# Patient Record
Sex: Female | Born: 1950 | Race: Black or African American | Hispanic: No | Smoking: Former smoker
Health system: Southern US, Community
[De-identification: ages and names within clinical notes are randomized; demographics above are authoritative.]

## PROBLEM LIST (undated history)

## (undated) DIAGNOSIS — I219 Acute myocardial infarction, unspecified: Secondary | ICD-10-CM

## (undated) DIAGNOSIS — E785 Hyperlipidemia, unspecified: Secondary | ICD-10-CM

## (undated) DIAGNOSIS — F323 Major depressive disorder, single episode, severe with psychotic features: Secondary | ICD-10-CM

## (undated) DIAGNOSIS — I639 Cerebral infarction, unspecified: Secondary | ICD-10-CM

## (undated) DIAGNOSIS — K219 Gastro-esophageal reflux disease without esophagitis: Secondary | ICD-10-CM

## (undated) DIAGNOSIS — I10 Essential (primary) hypertension: Secondary | ICD-10-CM

## (undated) DIAGNOSIS — H547 Unspecified visual loss: Secondary | ICD-10-CM

## (undated) DIAGNOSIS — K529 Noninfective gastroenteritis and colitis, unspecified: Secondary | ICD-10-CM

## (undated) DIAGNOSIS — F419 Anxiety disorder, unspecified: Secondary | ICD-10-CM

## (undated) HISTORY — PX: OTHER SURGICAL HISTORY: SHX169

## (undated) HISTORY — DX: Noninfective gastroenteritis and colitis, unspecified: K52.9

## (undated) HISTORY — PX: BILATERAL SALPINGOOPHORECTOMY: SHX1223

## (undated) HISTORY — DX: Cerebral infarction, unspecified: I63.9

## (undated) HISTORY — PX: ABDOMINAL HYSTERECTOMY: SHX81

## (undated) HISTORY — PX: CORONARY ANGIOPLASTY WITH STENT PLACEMENT: SHX49

---

## 2000-09-21 DIAGNOSIS — I639 Cerebral infarction, unspecified: Secondary | ICD-10-CM

## 2000-09-21 HISTORY — DX: Cerebral infarction, unspecified: I63.9

## 2001-02-08 ENCOUNTER — Emergency Department (HOSPITAL_COMMUNITY): Admission: EM | Admit: 2001-02-08 | Discharge: 2001-02-08 | Payer: Self-pay | Admitting: *Deleted

## 2001-03-01 ENCOUNTER — Ambulatory Visit (HOSPITAL_COMMUNITY): Admission: RE | Admit: 2001-03-01 | Discharge: 2001-03-01 | Payer: Self-pay | Admitting: Family Medicine

## 2001-03-01 ENCOUNTER — Encounter: Payer: Self-pay | Admitting: Family Medicine

## 2002-12-09 ENCOUNTER — Inpatient Hospital Stay (HOSPITAL_COMMUNITY): Admission: AD | Admit: 2002-12-09 | Discharge: 2002-12-11 | Payer: Self-pay | Admitting: *Deleted

## 2002-12-10 ENCOUNTER — Encounter: Payer: Self-pay | Admitting: *Deleted

## 2002-12-16 ENCOUNTER — Encounter: Payer: Self-pay | Admitting: Internal Medicine

## 2002-12-16 ENCOUNTER — Emergency Department (HOSPITAL_COMMUNITY): Admission: EM | Admit: 2002-12-16 | Discharge: 2002-12-16 | Payer: Self-pay | Admitting: Internal Medicine

## 2003-01-02 ENCOUNTER — Emergency Department (HOSPITAL_COMMUNITY): Admission: EM | Admit: 2003-01-02 | Discharge: 2003-01-02 | Payer: Self-pay | Admitting: *Deleted

## 2003-01-02 ENCOUNTER — Encounter: Payer: Self-pay | Admitting: *Deleted

## 2003-01-02 ENCOUNTER — Emergency Department (HOSPITAL_COMMUNITY): Admission: EM | Admit: 2003-01-02 | Discharge: 2003-01-02 | Payer: Self-pay | Admitting: Emergency Medicine

## 2003-01-15 ENCOUNTER — Ambulatory Visit (HOSPITAL_COMMUNITY): Admission: RE | Admit: 2003-01-15 | Discharge: 2003-01-15 | Payer: Self-pay | Admitting: Emergency Medicine

## 2003-01-19 ENCOUNTER — Ambulatory Visit (HOSPITAL_COMMUNITY): Admission: RE | Admit: 2003-01-19 | Discharge: 2003-01-19 | Payer: Self-pay | Admitting: Unknown Physician Specialty

## 2003-01-19 ENCOUNTER — Encounter: Payer: Self-pay | Admitting: Family Medicine

## 2003-01-30 ENCOUNTER — Encounter: Payer: Self-pay | Admitting: Emergency Medicine

## 2003-01-30 ENCOUNTER — Emergency Department (HOSPITAL_COMMUNITY): Admission: EM | Admit: 2003-01-30 | Discharge: 2003-01-30 | Payer: Self-pay | Admitting: *Deleted

## 2003-02-08 ENCOUNTER — Emergency Department (HOSPITAL_COMMUNITY): Admission: EM | Admit: 2003-02-08 | Discharge: 2003-02-08 | Payer: Self-pay | Admitting: Emergency Medicine

## 2003-02-08 ENCOUNTER — Encounter: Payer: Self-pay | Admitting: Emergency Medicine

## 2003-02-09 ENCOUNTER — Emergency Department (HOSPITAL_COMMUNITY): Admission: EM | Admit: 2003-02-09 | Discharge: 2003-02-09 | Payer: Self-pay | Admitting: Emergency Medicine

## 2003-02-12 ENCOUNTER — Emergency Department (HOSPITAL_COMMUNITY): Admission: EM | Admit: 2003-02-12 | Discharge: 2003-02-12 | Payer: Self-pay | Admitting: Emergency Medicine

## 2003-03-01 ENCOUNTER — Inpatient Hospital Stay (HOSPITAL_COMMUNITY): Admission: EM | Admit: 2003-03-01 | Discharge: 2003-03-06 | Payer: Self-pay | Admitting: *Deleted

## 2003-03-01 ENCOUNTER — Encounter: Payer: Self-pay | Admitting: *Deleted

## 2003-03-02 ENCOUNTER — Encounter: Payer: Self-pay | Admitting: *Deleted

## 2003-03-16 ENCOUNTER — Ambulatory Visit (HOSPITAL_COMMUNITY): Admission: RE | Admit: 2003-03-16 | Discharge: 2003-03-16 | Payer: Self-pay | Admitting: Cardiology

## 2003-03-20 ENCOUNTER — Encounter: Payer: Self-pay | Admitting: Cardiology

## 2003-04-30 ENCOUNTER — Inpatient Hospital Stay (HOSPITAL_COMMUNITY): Admission: EM | Admit: 2003-04-30 | Discharge: 2003-05-03 | Payer: Self-pay | Admitting: Psychiatry

## 2003-05-03 ENCOUNTER — Inpatient Hospital Stay (HOSPITAL_COMMUNITY): Admission: EM | Admit: 2003-05-03 | Discharge: 2003-05-06 | Payer: Self-pay | Admitting: Emergency Medicine

## 2004-01-04 ENCOUNTER — Ambulatory Visit (HOSPITAL_COMMUNITY): Admission: RE | Admit: 2004-01-04 | Discharge: 2004-01-04 | Payer: Self-pay | Admitting: General Surgery

## 2004-01-09 ENCOUNTER — Emergency Department (HOSPITAL_COMMUNITY): Admission: EM | Admit: 2004-01-09 | Discharge: 2004-01-09 | Payer: Self-pay | Admitting: Emergency Medicine

## 2004-05-12 ENCOUNTER — Emergency Department (HOSPITAL_COMMUNITY): Admission: EM | Admit: 2004-05-12 | Discharge: 2004-05-13 | Payer: Self-pay | Admitting: *Deleted

## 2004-05-14 ENCOUNTER — Inpatient Hospital Stay (HOSPITAL_COMMUNITY): Admission: EM | Admit: 2004-05-14 | Discharge: 2004-05-16 | Payer: Self-pay | Admitting: Emergency Medicine

## 2004-09-02 ENCOUNTER — Ambulatory Visit: Payer: Self-pay | Admitting: Internal Medicine

## 2004-09-02 ENCOUNTER — Inpatient Hospital Stay (HOSPITAL_COMMUNITY): Admission: AD | Admit: 2004-09-02 | Discharge: 2004-09-09 | Payer: Self-pay | Admitting: Internal Medicine

## 2004-09-02 ENCOUNTER — Ambulatory Visit: Payer: Self-pay | Admitting: Cardiology

## 2004-09-04 ENCOUNTER — Ambulatory Visit: Payer: Self-pay | Admitting: *Deleted

## 2004-09-05 HISTORY — PX: ESOPHAGOGASTRODUODENOSCOPY: SHX1529

## 2005-02-03 ENCOUNTER — Other Ambulatory Visit: Admission: RE | Admit: 2005-02-03 | Discharge: 2005-02-03 | Payer: Self-pay | Admitting: Orthopaedic Surgery

## 2005-11-27 ENCOUNTER — Ambulatory Visit (HOSPITAL_COMMUNITY): Admission: RE | Admit: 2005-11-27 | Discharge: 2005-11-27 | Payer: Self-pay | Admitting: Internal Medicine

## 2006-07-26 ENCOUNTER — Ambulatory Visit: Payer: Self-pay | Admitting: Cardiology

## 2006-08-02 ENCOUNTER — Emergency Department (HOSPITAL_COMMUNITY): Admission: EM | Admit: 2006-08-02 | Discharge: 2006-08-02 | Payer: Self-pay | Admitting: Emergency Medicine

## 2007-04-19 ENCOUNTER — Emergency Department (HOSPITAL_COMMUNITY): Admission: EM | Admit: 2007-04-19 | Discharge: 2007-04-19 | Payer: Self-pay | Admitting: Emergency Medicine

## 2008-04-21 ENCOUNTER — Emergency Department (HOSPITAL_COMMUNITY): Admission: EM | Admit: 2008-04-21 | Discharge: 2008-04-21 | Payer: Self-pay | Admitting: Emergency Medicine

## 2008-09-22 ENCOUNTER — Emergency Department (HOSPITAL_COMMUNITY): Admission: EM | Admit: 2008-09-22 | Discharge: 2008-09-22 | Payer: Self-pay | Admitting: Emergency Medicine

## 2009-03-18 DIAGNOSIS — N289 Disorder of kidney and ureter, unspecified: Secondary | ICD-10-CM | POA: Insufficient documentation

## 2009-03-18 DIAGNOSIS — I10 Essential (primary) hypertension: Secondary | ICD-10-CM | POA: Insufficient documentation

## 2009-03-18 DIAGNOSIS — E785 Hyperlipidemia, unspecified: Secondary | ICD-10-CM

## 2010-10-11 ENCOUNTER — Encounter: Payer: Self-pay | Admitting: Internal Medicine

## 2010-10-13 ENCOUNTER — Encounter: Payer: Self-pay | Admitting: Internal Medicine

## 2011-01-05 LAB — DIFFERENTIAL
Basophils Absolute: 0 10*3/uL (ref 0.0–0.1)
Basophils Relative: 0 % (ref 0–1)
Eosinophils Relative: 0 % (ref 0–5)
Monocytes Relative: 7 % (ref 3–12)
Neutro Abs: 18.2 10*3/uL — ABNORMAL HIGH (ref 1.7–7.7)
Neutrophils Relative %: 90 % — ABNORMAL HIGH (ref 43–77)

## 2011-01-05 LAB — COMPREHENSIVE METABOLIC PANEL
CO2: 24 mEq/L (ref 19–32)
GFR calc Af Amer: >60 mL/min (ref >60)
Glucose, Bld: 152 mg/dL — ABNORMAL HIGH (ref 70–99)
Potassium: 3.4 mEq/L — ABNORMAL LOW (ref 3.5–5.1)

## 2011-01-05 LAB — URINE MICROSCOPIC-ADD ON

## 2011-01-05 LAB — URINALYSIS, ROUTINE W REFLEX MICROSCOPIC
Leukocytes, UA: NEGATIVE
Nitrite: NEGATIVE
Urobilinogen, UA: 1 mg/dL (ref 0.0–1.0)

## 2011-01-05 LAB — CBC
HCT: 41.5 % (ref 36.0–46.0)
Platelets: 253 10*3/uL (ref 150–400)
WBC: 20.4 10*3/uL — ABNORMAL HIGH (ref 4.0–10.5)

## 2011-01-05 LAB — LIPASE, BLOOD: Lipase: 18 U/L (ref 11–59)

## 2011-02-06 NOTE — H&P (Signed)
[name], [name]                      ACCOUNT NO.:  [id number]   MEDICAL RECORD NO.:  [id number]                   PATIENT TYPE:  INP   LOCATION:  IC09                                 FACILITY:  [facility]   PHYSICIAN:  Corrie Mckusick, M.D.               DATE OF BIRTH:  29-Jan-1951   DATE OF ADMISSION:  05/03/2003  DATE OF DISCHARGE:                                HISTORY & PHYSICAL   PRIMARY PHYSICIAN:  [facility].   ADMITTING DIAGNOSIS:  Pyelonephritis.   ADDITIONAL DIAGNOSES:  1. Coronary artery disease.  2. History of cerebrovascular accident.  3. Hypertension.  4. Hyperlipidemia.  5. Psychiatric history with recent discharge from [facility] on the     day of admission here to [facility].   ADMITTING CONDITION:  Guarded.   HISTORY OF PRESENTING ILLNESS:  This is a 60 year old African-American  female with longstanding history of coronary artery disease which is  inoperable, status post recent catheterization in March of this year.  She  has had a history of stent placement in the past and was told that she  cannot even undergo CABG this year.  She also has a history of hypertension,  hyperlipidemia, history of CVA, as well as history of depression.  She was  in [facility] for three days due to worsening of her  chronic depression.  She had some suicidal thoughts due to the fact that she  was worried about getting on disability.  She now states that she no longer  feels suicidal whatsoever and contracts with me.  She was discharged from  [facility] today with a fever of 102.   For the last 48 hours the patient has had dysuria, frequency, and urgency.  She has also had fevers to 101, 102.  She has not noticed any blood in her  urine.  She has now today had left-sided flank pain.  GI review of systems  otherwise negative.  Respiratory review of systems as well as cardiovascular  review of systems are negative.   PAST MEDICAL  HISTORY:  1. Coronary artery disease, status post MI and stents in 1997.  Again,     attempted stents in March 2004, with CABG needed but inoperable.  2. History of CVA.  3. Hypertension.  4. Hyperlipidemia.  5. Depression.   PAST SURGICAL HISTORY:  1. BTL.  2. Total abdominal hysterectomy.  3. Bilateral breast tumor removals.   MEDICATIONS ON ADMISSION:  1. Clonidine 0.3 p.o. b.i.d.  2. Toprol 100 mg p.o. daily.  3. Pravachol 20 mg p.o. q.h.s.  4. Norvasc 10 mg p.o. daily.  5. Ditropan 5 mg p.o. daily.  6. Aspirin 81 mg p.o. daily.  7. Plavix 75 mg p.o. daily.  8. Xanax 0.5 mg p.o. b.i.d.  9. Avapro 300 mg p.o. daily.  10.      Seroquel 25 mg, 2 p.o. q.h.s.  11.  Ambien 10 mg p.o. q.h.s.  12.      Paxil CR 25 mg p.o. daily.   ALLERGIES:  PENICILLIN and CONTRAST DYE.   SOCIAL HISTORY:  Smokes a pack a day since age 87.  No alcohol or illicit  drug use.  Lives alone.   FAMILY HISTORY:  Noncontributory.  No history of depression in her family,  cardiac disease, CVAs, or diabetes.   PHYSICAL EXAMINATION:  VITAL SIGNS:  Temperature 103, heart rate 123, blood  pressure is 136/82, respiratory rate 18.  GENERAL:  When I saw the patient she was feeling quite a bit better as she  had received her first dose of Rocephin.  She was in no acute distress and  had no complaints.  HEENT:  Mucous membranes are slightly dry but otherwise clear.  There is no  erythema.  NECK:  No lymphadenopathy.  No JVD.  CHEST:  Clear to auscultation bilaterally.  CARDIOVASCULAR:  Regular rate and rhythm, with a normal S1, S2.  No S3, S4,  murmurs, gallops, or rubs.  ABDOMEN:  Bowel sounds positive.  Soft, nontender.  There is no suprapubic  tenderness.  There is no rebound.  No guarding.  There is some mild left  flank pain with pounding.  EXTREMITIES:  No cyanosis, clubbing.  No edema.   LABORATORIES:  White blood cell count 22.7, hemoglobin 11.3, hematocrit  32.3, platelets 259.  Absolute  neutrophil count is 19.5.  Sodium 139,  potassium 3.3, chloride 101, bicarbonate 27, glucose 169, BUN 9, creatinine  0.9.  Urinalysis:  Specific gravity 1.006, pH of 5.5, glucose negative,  moderate amount of blood, negative ketones, negative nitrates, small amount  of leukocyte esterase.  Positive for bacteria and white blood cells.   ASSESSMENT:  This is a 60 year old female with multiple comorbidities  including coronary artery disease, inoperable hypertension, hyperlipidemia,  and depression who [facility] due  to suicidal intentions who presents with early pyelonephritis.   PLAN:  1. Admit to 2A for close monitoring.  The patient will not be put on suicide     watch if she contracts with me that she is no longer suicidal.  2. Will continue Rocephin 1 g IV q.12h.  3. Urine cultures obtained.  4. D-5 half normal saline with 20 mEq of KCl at 100 mL/h for hydration as     well as for the hyponatremia.  5. Will continue her current medications and close monitoring.  6. Will work with her to discontinue smoking.  7. Repeat CBC in the morning to assure that white count is declining as well     as the hemoglobin and hematocrit are stable.  Will also repeat the Chem-7     in the morning to assure that the hypokalemia is improving.   Patient discussed with Dr. Josefine Class.                                               Corrie Mckusick, M.D.    JCG/MEDQ  D:  05/03/2003  T:  05/04/2003  Job:  [id number]

## 2011-02-06 NOTE — Cardiovascular Report (Signed)
NAME:  [name], [name]                      ACCOUNT NO.:  [id number]   MEDICAL RECORD NO.:  [id number]                   PATIENT TYPE:  INP   LOCATION:  3707                                 FACILITY:  [facility]   PHYSICIAN:  Learta Codding, M.D. [facility]             DATE OF BIRTH:  December 28, 1950   DATE OF PROCEDURE:  12/11/2002  DATE OF DISCHARGE:  12/11/2002                              CARDIAC CATHETERIZATION   REFERRING PHYSICIAN:  Dr. Yell Bing.   PROCEDURES PERFORMED:  1. Left heart catheterization with selective coronary angiography.  2. Ventriculography.   DIAGNOSES:  1. Single-vessel coronary artery disease with an occluded right coronary     artery.  2. Normal left ventricular systolic function with inferior akinesis.   INDICATION:  The patient is a 60 year old African American with a prior  history of myocardial infarction (inferior wall myocardial infarction) with  stent to the right coronary artery in 1997.  The patient now presents with a  non-Q-wave myocardial infarction after acute onset of substernal chest  pressure.  She had mild troponin elevation of approximately 2.1.  She is now  referred for diagnostic catheterization to assess her coronary anatomy.   DESCRIPTION OF PROCEDURE:  After informed consent was obtained, the patient  was brought to the catheterization laboratory.  The right groin was  sterilely prepped and draped.  Lidocaine 1% was infiltrated.  A 6-French  arterial sheath was placed using a modified Seldinger technique.  Following  this, 6-French JL4 and JR4 catheters were used to engage the left and right  coronary arteries.  Ventriculography was performed in single-plane RAO  projection using power injection of contrast.  At the termination of the  procedure, all catheters and sheaths were removed and the patient was  brought back to the holding area.   FINDINGS:  Left ventricular systolic pressure of 170/5 mmHg.  Aortic  pressure 152/86  mmHg.   VENTRICULOGRAPHY:  Ejection fraction was approximately 50-60% with a large  area of inferior akinesis.  No significant mitral regurgitation.   SELECTIVE ANGIOGRAPHY:  1. Left main coronary artery was a large-caliber vessel with no evidence of     flow-limiting disease.  2. Left anterior descending artery was a large-caliber vessel giving rise to     several diagonal branches with no evidence of flow-limiting disease.  3. Circumflex had some mild plaquing in the proximal segment.  Obtuse     marginal branches were free of flow-limiting disease.  4. Right coronary artery was a small vessel.  Stent was visualized in the     proximal-to-mid segment.  There was diffuse in-stent restenosis of     approximately 40-50% with tandem lesions at the distal end of the stent     of approximately 99%.  The remainder of the RCA was diffusely diseased     and ultimately occluded in its distal left segment.  There were left and  right collaterals, septal perforators collateralizing to the right     coronary artery.   RECOMMENDATION:  Angiographic images were carefully reviewed with Dr. Veneda Melter.  The plan is to continue to medical therapy.  The right coronary  artery appears to be occluded but there are left-to-right collaterals.  Unless the patient has recurrent substernal chest pain and is failing  medical therapy, then critical consideration will be given to percutaneous  intervention.  This, however, would require stenting of the entire right  coronary artery and I suspect the distal RCA has been a rather chronic  occlusion.  The patient does have a small gradient across the aortic valve  and should undergo 2-D echocardiographic study, however, the left  ventricular and the aortic pressure were very variable during the phase of  respiration.  The patient also has significant hypertension and should be  aggressively treated for her hypertension.                                                Learta Codding, M.D. [facility]    GED/MEDQ  D:  12/11/2002  T:  12/12/2002  Job:  [id number]   cc:   [place] Bing, M.D. [facility]

## 2011-02-06 NOTE — Consult Note (Signed)
[name], [name]                      ACCOUNT NO.:  [id number]   MEDICAL RECORD NO.:  [id number]                   PATIENT TYPE:  INP   LOCATION:  A228                                 FACILITY:  [facility]   PHYSICIAN:  [place] Bing, M.D.               DATE OF BIRTH:  Jun 29, 1951   DATE OF CONSULTATION:  05/15/2004  DATE OF DISCHARGE:                                   CONSULTATION   REFERRING PHYSICIAN:  Tesfaye D. Felecia Shelling, M.D.   HISTORY OF PRESENT ILLNESS:  A 60 year old woman with known coronary  disease, admitted to the hospital with chest discomfort and palpitations.  [name] cardiac history dates to 55 when she suffered an acute  inferior myocardial infarction treated with primary percutaneous  intervention to the RCA.  She subsequently did well until the year 2000 by  her report when she was readmitted with chest discomfort.  She underwent  repeat coronary angiography in March of 2004 and was found to have moderate  diffuse end-stent restenosis.  Left-to-right collaterals were present at  that time.  Medical therapy was thought appropriate.   She has continued to intermittently have chest discomfort that is generally  managed with sublingual nitroglycerin or rest.  The night prior to  admission, she noted chest discomfort that responded to nitroglycerin.  The  following night, while resting in bed, she developed a sensation of  indigestion that was fairly vague and associated with palpitations.  She has  subsequently noted left-sided burning chest discomfort of moderate severity.  There was associated flushing and dyspnea with a sense of presyncope.  EMS  was summoned and the patient was transported to the hospital.  By the time  of arrival, her symptoms had essentially resolved.   [name] has a history of hypertension.  She continues to smoke  cigarettes.  She has not had diabetes.  She has had hyperlipidemia.   CURRENT MEDICATIONS:  1. Pravastatin 20 mg  daily.  2. Oxybutynin 5 mg daily.  3. Nitroglycerin p.r.n.  4. Clopidogrel 75 mg daily.  5. Iron supplement 325 mg b.i.d.  6. Colace 100 mg b.i.d.  7. Amlodipine 5 mg daily.  8. Metoprolol 50 mg daily.  9. Multivitamins.  10.      Aspirin 81 mg daily.  11.      Clonidine 0.3 mg b.i.d.  12.      Avalide 300/12.5 mg daily.  13.      Seroquel 200 mg q.h.s.   The patient receives disability, but has lost her Medicaid and has trouble  affording her medications.   ALLERGIES:  She reports multiple allergies/drug sensitivities, including:  1. PENICILLIN.  2. MORPHINE.  3. IODINE.  4. INTRAVENOUS CONTRAST.  5. ASPIRIN.  6. MOTRIN.   PAST MEDICAL HISTORY:  Otherwise notable for:  1. Hyperthyroidism.  2. Anemia without a clear etiology.  3. TAH/BSO in her early 77s for benign disease.  4. A  history of CVA a few years ago.  5. Depression.  6. Mild renal artery stenosis.   SOCIAL HISTORY:  Lives alone in [place], [place], with three grown  children in the area.  Continues to smoke one packs of cigarettes per day.  History of cannabis use.  Denies excessive use of alcohol.   FAMILY HISTORY:  Her father had multiple sclerosis and died in his 50s.  Her  mother is alive in her 55s with dementia and a history of cerebral aneurysm.  She had one brother, who died at age 20.   REVIEW OF SYSTEMS:  Notable for intermittent diaphoresis, weight loss,  decreased appetite, decreased visual acuity since her stroke, depression,  constipation and numbness in her hands and feet.  All other systems reviewed  and are negative.   PHYSICAL EXAMINATION:  GENERAL APPEARANCE:  A chronically ill-appearing  woman in no acute distress.  VITAL SIGNS:  The temperature is 97 degrees, heart rate 90 and regular,  respirations 20 and blood pressure 100/60.  HEENT:  Anicteric sclerae.  Normal lids and conjunctivae.  NECK:  No jugular venous distention.  Normal carotid upstrokes without  bruits.  SKIN:   Sallow.  ENDOCRINE:  No thyromegaly.  HEMATOPOIETIC:  No cervical, axillary or inguinal adenopathy.  LUNGS:  Clear.  CARDIAC:  Normal first and second heart sounds.  ABDOMEN:  Soft and nontender.  No organomegaly.  Normal bowel sounds.  No  bruits.  EXTREMITIES:  Distal pulses intact.  No edema.  NEUROMUSCULAR:  Symmetric strength and tone.  MUSCULOSKELETAL:  No joint deformities.   LABORATORY DATA:  Chest x-ray reportedly unremarkable.   EKG:  Normal sinus rhythm, low voltage, otherwise within normal limits.   Other laboratories notable for normal CPK and CPK-MB with troponins of 0.05  and 0.07.  Normal renal function.  Significant anemia with hemoglobin of 9.6  and hematocrit of 28.1.   IMPRESSION:  [name] has had recurrent chest discomfort since  myocardial infarction eight years ago.  Catheterization was relatively good  one-and-a-half years ago.  In the absence of compelling evidence for  myocardial ischemia, a pharmacologic stress Cardiolite study is warranted to  exclude significant coronary stenosis.  A lipid profile will also be  obtained.  Her medical regimen at present appears relatively good.  She  probably does not have iron deficiency anemia.  Consultation with a  hematologist may be warranted.  Records from [facility] were  obtained and were reviewed.  We appreciate the consultation request and will  be happy to follow this woman with you both in hospital and subsequent to  discharge.      ___________________________________________                                            [place] Bing, M.D.   RR/MEDQ  D:  05/15/2004  T:  05/15/2004  Job:  [id number]

## 2011-02-06 NOTE — Discharge Summary (Signed)
NAME:  OLEVA, KOO                      ACCOUNT NO.:  [id number]   MEDICAL RECORD NO.:  [id number]                   PATIENT TYPE:  IPS   LOCATION:  0500                                 FACILITY:  [facility]   PHYSICIAN:  Jeanice Lim, M.D.              DATE OF BIRTH:  21-Oct-1950   DATE OF ADMISSION:  04/30/2003  DATE OF DISCHARGE:  05/03/2003                                 DISCHARGE SUMMARY   IDENTIFYING DATA:  This is a 60 year old widowed African-American female  voluntarily admitted, presenting with a history of depression, passive  suicidal ideation and overwhelmed with finances, medical problems, feels  when she tries harder, she is knocked down.  Recently, nephew was killed  from home invasion.  Decreased sleep, appetite, hopeless, helpless,  paranoid.   MEDICATIONS:  Clonidine 0.3 mg b.i.d., Toprol XL 100 mg q.d., Pravachol,  Norvasc, aspirin, Plavix, Avapro and Ditropan.   ALLERGIES:  PENICILLIN and ASPIRIN.   PHYSICAL EXAMINATION:  Essentially within normal limits.  Neurologically  nonfocal.   LABORATORY DATA:  EKG within normal limits.  Glucose 126.  CBC within normal  limits.  Urine drug screen positive for cannabis and benzodiazepines.   MENTAL STATUS EXAM:  Alert, middle-aged, African-American female.  Cooperative.  Little eye contact.  Soft speech.  Mood hopeless.  Affect  flat, depressed.  Thought process goal directed but experiencing possible  paranoid ideation and passive suicidal ideation.  Cognition intact.  Judgment and insight fair.   ADMISSION DIAGNOSES:   AXIS I:  Major depressive disorder with psychotic features.   AXIS II:  Deferred.   AXIS III:  1. Status post myocardial infarction x 2.  2. Hypertension.  3. Cerebrovascular accident.   AXIS IV:  Moderate (problems with economic, other psychosocial issues and  medical problems as well as recent loss).   AXIS V:  30/60.   HOSPITAL COURSE:  The patient was admitted and ordered routine  p.r.n.  medications and underwent further monitoring.  Was encouraged to participate  in individual, group and milieu therapy.  The patient described multiple  stressors, fleeting suicidal ideation and feeling depressed.  Had been off  psychiatric medicines for greater than two years and has had multiple  cardiac events and does, at times, have thoughts of overdosing, describing a  plan.  Paxil was optimized targeting depression.  The patient participated  in aftercare planning.  The patient reported feeling side effects from  clonidine but otherwise felt that clinical intervention had benefited her.  She denied any acute dangerous ideation or risk issues.   CONDITION ON DISCHARGE:  Improved.  Mood was more euthymic.  Affect  brighter.  Thought processes goal directed.  Thought content negative for  dangerous ideation or psychotic symptoms.  No longer paranoid.  No suicidal  ideation at the time of discharge, reporting motivation to follow up with  psychiatric management as well as with her recent low-grade temperature and  Cipro was started for a possible UTI as well as medical follow-up set up.  The patient was explained the risk/benefit ratio and alternative treatments  regarding medications and was comfortable with medications prescribed.   DISCHARGE MEDICATIONS:  1. Xanax 0.25 mg, take 1 tablet four times a day p.r.n. anxiety.  2. Paxil CR 25 mg q.a.m.  3. Seroquel 25 mg, 2 q.h.s.  4. Ambien 10 mg q.h.s.  5. Cipro 500 mg, 1 q.12h. x 7 days and then follow up with medical doctor.   FOLLOW UP:  The patient was to restart previous medical medications as  prescribed before admission and take labs and this discharge paper to her  family doctor as well as follow up at [facility]  on Monday, May 07, 2003 at 2 p.m.   DISCHARGE DIAGNOSES:   AXIS I:  Major depressive disorder with psychotic features.   AXIS II:  Deferred.   AXIS III:  1. Status post  myocardial infarction x 2.  2. Hypertension.  3. Cerebrovascular accident.   AXIS IV:  Moderate (problems with economic, other psychosocial issues and  medical problems as well as recent loss).   AXIS V:  Global Assessment of Functioning on discharge 55.                                               Jeanice Lim, M.D.    JEM/MEDQ  D:  05/25/2003  T:  05/27/2003  Job:  [id number]

## 2011-02-06 NOTE — H&P (Signed)
[name], [name]                      ACCOUNT NO.:  [id number]   MEDICAL RECORD NO.:  [id number]                   PATIENT TYPE:  INP   LOCATION:  3707                                 FACILITY:  [facility]   PHYSICIAN:  Hubbard Hartshorn, M.D. [facility]              DATE OF BIRTH:  October 16, 1950   DATE OF ADMISSION:  12/09/2002  DATE OF DISCHARGE:                                HISTORY & PHYSICAL   HISTORY OF PRESENT ILLNESS:  The patient is a 60 year old black female with  a history of MI/PTCA in 1997, who presented to an outside hospital with 5/10  left-sided chest burning that was constant for over four hours.  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  There was no change with  position, no pleuritic component to it.  She feels that this is different  from her pain with her prior MI.  She also is complaining of shortness of  breath, nausea and diaphoresis.  She denies any syncope, palpitations, PND  or orthopnea; positive tobacco and marijuana use.   PAST MEDICAL HISTORY:  1. CAD, status post MI and PTCA.  2. Hypertension.  3. Hyperlipidemia.   MEDICATIONS:  1. Clonidine 0.2 mg p.o. b.i.d.  2. Toprol-XL 100 mg p.o. daily.  3. Pravachol 20 mg p.o. q.h.s.  4. Premarin 0.625 mg p.o. daily.  5. Aspirin 81 mg p.o. daily.  6. Diovan/hydrochlorothiazide 25 mg p.o. daily.  7. Ditropan 5 mg p.o. daily.   ALLERGIES:  She has a history of allergy to PENICILLIN that causes a rash.  She had mild rash on given MORPHINE and she also has a QUESTIONABLE CONTRAST  DYE allergy.   FAMILY HISTORY:  Noncontributory.   SOCIAL HISTORY:  The patient is divorced with 3 children, 40-pack-year  smoking history, with history of marijuana use.  No alcohol.   REVIEW OF SYSTEMS:  Review of systems were negative for GI, GU, CNS and  musculoskeletal; positive for nausea, shortness of breath and diaphoresis.   PHYSICAL EXAMINATION:  VITAL SIGNS:  Temperature 97.8, heart rate of 49,  blood  pressure 185/74, respiratory rate of 16.  GENERAL:  The patient was lying comfortably in no acute distress.  NECK:  There was no JVP, no hepatojugular reflux and no carotid bruits.  LUNGS:  There were crackles on the right base, no focal consolidation.  CARDIAC:  There is a normal S1 and S2, regular rate and rhythm.  No murmurs,  clicks, gallops or rubs.  ABDOMEN:  Abdomen was soft, nontender and nondistended.  No  hepatosplenomegaly.  EXTREMITIES:  There is no clubbing, cyanosis, or edema, with 2+ distal  pulses.   LABORATORY DATA:  Her outside hospital labs showed an INR of 1.1, white  count of 11, potassium 3.3, creatinine 0.7, troponin I of 0.07, MB of 2.   EKG showed sinus bradycardia with possible old IMI and LVH with ST changes.  ASSESSMENT AND PLAN:  60-year-old black female with coronary artery  disease, status post myocardial infarction and percutaneous transluminal  coronary angioplasty in 1997, hypertension and tobacco use, who presents  with constant left-sided chest pain with nausea and diaphoresis and  shortness of breath and electrocardiogram shows left ventricular hypertrophy  with ST changes and she has a mild increase of troponin.   IMPRESSION:  Symptoms are a little bit atypical for angina, although the  troponin is a bit worrisome.  We will check serial enzymes and a lipid  profile, beta natriuretic peptide and liver function tests while she is in  the hospital here.  We will also start her on Lovenox that she got in an  outside emergency room, aspirin, nitroglycerin, a statin  as well as a  proton pump inhibitor.  We will continue her on her clonidine and her Toprol  as well as her hydrochlorothiazide.  If her enzymes come back and they are  positive, would recommend catheterization; if negative, then we will  consider a stress test.                                               Hubbard Hartshorn, M.D. [facility]    JH/MEDQ  D:  12/10/2002  T:  12/11/2002   Job:  [id number]

## 2011-02-06 NOTE — Discharge Summary (Signed)
[name], [name]            ACCOUNT NO.:  [id number]   MEDICAL RECORD NO.:  [id number]          PATIENT TYPE:  INP   LOCATION:  A228                          FACILITY:  [facility]   PHYSICIAN:  Tesfaye D. Felecia Shelling, M.D. DATE OF BIRTH:  [birth year]   DATE OF ADMISSION:  05/14/2004  DATE OF DISCHARGE:  08/30/2005LH                                 DISCHARGE SUMMARY   DISCHARGE DIAGNOSES:  1.  Non-cardiac chest pain.  2.  History of coronary artery disease.  3.  Hypertension.  4.  Anemia.  5.  History of cerebrovascular accident.  6.  Depression disorder.  7.  Mild renal stenosis.  8.  Hyperlipidemia.  9.  History of major depression disorder.   DISCHARGE MEDICATIONS:  1.  Pravachol 20 mg p.o. q.d.  2.  Oxybutynin 5 mg p.o. q.d.  3.  Nitroglycerin 0.4 sublingual p.r.n.  4.  Plavix 75 mg p.o. q.d.  5.  Ferrous sulfate 325 mg p.o. b.i.d.  6.  Colace 100 mg p.o. b.i.d.  7.  Norvasc 5 mg p.o. q.d.  8.  Toprol 50 mg p.o. q.d.  9.  Multivitamin one tablet p.o. q.d.  10. Aspirin 81 mg p.o. q.d.  11. Clonidine 0.5 mg p.o. b.i.d.  12. Avalide 300/12.5 mg one tablet p.o. q.d.  13. Seroquel 200 mg p.o. q.h.s.   DISPOSITION:  The patient was discharged home in stable condition.   HOSPITAL COURSE:  This is a 60 year old female patient with a history of  multiple medical illnesses including coronary artery disease and status post  MI who came to the emergency room due to chest pain.  She was admitted as  a possible case of unstable angina.  Serial EKGs and cardiac enzymes were  done.  A cardiology consult was done and she had a mild elevation of  troponin.  Stress test was done and there were no acute ischemic changes.  The patient was discharged pain free to continue her regular medications.      TDF/MEDQ  D:  06/16/2004  T:  06/16/2004  Job:  [id number]

## 2011-02-06 NOTE — Letter (Signed)
July 26, 2006    Tesfaye D. Felecia Shelling, MD  [address]  [place], [place] [address]   RE:  April, [name]  MRN:  [id number]  /  DOB:  [birth year]   Dear Ninetta Lights,   [name] returns to the office after a two year hiatus. She is being  seen at her request for followup, but has not experienced any significant  medical symptoms of late. She has had trouble affording her medications and  has discontinued a number of them. She previously suffered amaurosis fugax  or a TIA or CVA - history is not clear, prompting treatment with clopidogrel  which she continues. I have extensively reviewed her past medical records.   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 She is uncertain whether blood  pressure or cholesterol has been adequately controlled. She discontinued  nicotine patches due to skin irritation and continues to smoke cigarettes.   CURRENT MEDICATIONS:  1. Aspirin 81 mg daily.  2. Clonidine 0.3 mg b.i.d.  3. Metoprolol 100 mg daily.  4. Clopidogrel 75 mg daily.  5. Avalide 300 mg daily.  6. Simvastatin 20 mg daily.  7. Omeprazole 20 mg daily.   She is experiencing hair loss, which she attributes to Toprol.   PHYSICAL EXAMINATION:  GENERAL:  Pleasant, well-appearing woman.  VITAL SIGNS:  The weight is 136, 25 pounds more than in September of 2005.  Blood pressure 170/100, heart rate 60 and regular, respirations 16.  HEENT:  Anicteric sclera; pupils equal round and reactive to light.  NECK:  Right carotid bruit.  LUNGS:  Clear.  CARDIAC:  Normal first and second heart sounds; fourth heart sound present.  ABDOMEN:  Soft and nontender, no masses, no organomegaly.  EXTREMITIES:  Distal pulses intact; no edema.   EKG:  Normal sinus rhythm; left axis deviation; delayed R wave progression.  When compared to March 20, 2003, these findings are new.   IMPRESSION:  [name] is doing well  from a symptomatic standpoint. I  doubt whether Toprol is causing hair loss, and it is a very good medication  for her. To reduce the cost, we will change to metoprolol 50 mg b.i.d.  Lisinopril 20 mg daily and chlorthalidone 25 mg daily will be substituted  for Avalide. A chemistry profile will be assessed in one month. Her dose of  Simvastatin will be increased to 40 mg daily. We will assess lipids in one  month. I will see her shortly thereafter.    Sincerely,      Gerrit Friends. Dietrich Pates, MD, [facility]  Electronically Signed    RMR/MedQ  DD: 07/26/2006  DT: 07/27/2006  Job #: [id number]

## 2011-02-06 NOTE — Procedures (Signed)
   [name], BURGER                      ACCOUNT NO.:  [id number]   MEDICAL RECORD NO.:  [id number]                   PATIENT TYPE:  EMS   LOCATION:  ED                                   FACILITY:  [facility]   PHYSICIAN:  Edward L. Juanetta Gosling, M.D.             DATE OF BIRTH:  [birth year]   DATE OF PROCEDURE:  12/16/2002  DATE OF DISCHARGE:  12/16/2002                                EKG INTERPRETATION   Time 1012 hours.   The rhythm is a sinus rhythm with a rate of 60.  There are diffuse ST-T wave  changes, most more inferiorly and laterally.  Abnormal electrocardiogram.                                               Oneal Deputy. Juanetta Gosling, M.D.    ELH/MEDQ  D:  12/21/2002  T:  12/23/2002  Job:  [id number]

## 2011-02-06 NOTE — Consult Note (Signed)
NAMEANGELIKI, [name]            ACCOUNT NO.:  [id number]   MEDICAL RECORD NO.:  [id number]          PATIENT TYPE:  INP   LOCATION:  A223                          FACILITY:  [facility]   PHYSICIAN:  Lionel December, M.D.    DATE OF BIRTH:  1951-08-14   DATE OF CONSULTATION:  09/03/2004  DATE OF DISCHARGE:                                   CONSULTATION   REASON FOR CONSULTATION:  Anorexia and weight loss.   PHYSICIAN REQUESTING CONSULTATION:  Vida Roller, M.D.   __________   HISTORY OF PRESENT ILLNESS:  The patient is a 60 year old white female who  was admitted with a 2-week history of weakness and syncopal episode.  She  was seen in the office the day of admission, and found to be hypotensive.  She also has anemia which at least dates back to August of this year.  She  complained of lower abdominal pain and constipation.  She reports a 15 pound  weight loss in the last 2 weeks, but review of the medical records reveals  that she has actually lost at least 27 pounds since August of 2005.  She  complains of weakness, intermittent nausea and vomiting the last could of  weeks, feeling dizzy with standing.  She complains of lower abdominal pain,  which has been off and on since March of last year.  She has had  constipation with this for this period of time, as well.  She feels like her  constipation is due to her medication.  She denies any hematemesis of heart  burn.  She also has chronic intermittent left chest pain.  She reports  having left chest pain after vomiting, which is now resolved.  She complains  of headache.  She generally has one bowel movement a week, but reports no  bowel movement in the last 2 weeks.  She also has to use laxatives.  She  uses stool softeners daily.  No rectal bleeding.  Her stools are dark, being  on iron.  She reports a decreased oral intake due to GI symptoms.   She had an unremarkable chest x-ray.  CT without contrast revealed a 1 mm  renal  calculus of the mid left kidney seen previously.  Atherosclerotic  calcification of the abdominal aorta.  Stable upper pole left renal cyst.  White count on admission was 14,300.  It is 11,600 today.  Hemoglobin is  10.6, hematocrit 31.3, platelets 301,000, and MCV 99.3.  BUN 18, creatinine  1.6, glucose 103, potassium 3.6, total bilirubin 0.5, alkaline phosphatase  158, AST 25, ALT 22.  Albumin 3.4, TSH 0.120.  In August of 2005, her  hemoglobin was 9.6, hematocrit 28.1.  Then her iron was 90, iron saturations  50%, ferritin 221, B-12 of 555, folate greater than 20, TIBC 181.  Sed rate  today was 72.   MEDICATIONS PRIOR TO ADMISSION:  1.  Zoloft 100 mg daily.  2.  Iron 325 mg daily.  3.  Calcium plus D b.i.d.  4.  Plavix 75 mg daily.  5.  Multivitamin daily.  6.  Seroquel 300 mg q.h.s.  7.  Clonidine 0.3 mg b.i.d.  8.  Toprol-XL 100 mg daily.  9.  Pravachol 20 mg q.h.s.  10. Avalide 300/12.5 daily.  11. Norvasc 10 mg daily.  12. Ditropan 0.5 mg daily.  13. Aspirin 81 mg daily.  14. Stool softener 100 mg daily.  15. Nitroglycerin 0.4 mg p.r.n.  16. Aspirin 81 mg daily.  17. Remifemin 20 mg b.i.d.   ALLERGIES:  1.  MOTRIN.  2.  PENICILLIN.  3.  Reports IV CONTRAST causes fever and UTI.   PAST MEDICAL HISTORY:  1.  History of coronary artery disease with MI in 1997, and non-Q wave MI in      March of 2004.  She had stenting in 1997.  She had a negative Cardiolite      stress test in August of 2005.  She was hospitalized with non-cardiac      chest pain.  2.  She has hypertension.  3.  History of anemia, as outlined above.  4.  She has had CVA with right eye blindness.  5.  She has major depression.  She was admitted in September of 2004 with      this.  6.  Dyslipidemia.   PAST SURGICAL HISTORY:  1.  She initially had a tubal ligation; she later had a partial      hysterectomy, and eventually a bilateral salpingo-oophorectomy.  2.  She had left wrist ganglion cyst  excision.  3.  She had bilateral benign breast tumor removed.   FAMILY HISTORY:  Mother has a history of brain aneurysm.  Father died in his  93s and had multiple sclerosis.  No family history of colorectal cancer.   SOCIAL HISTORY:  She is single.  She h.s. 3 children.  She is disabled.  She  generally smokes a pack of cigarettes daily, but reports that she is down to  1 cigarette daily now.  Denies any alcohol use.  According to the medical  record, has a history of marijuana use, which she denies.   REVIEW OF SYSTEMS:  See HPI for GI, cardiopulmonary.  Complains of chronic  intermittent chest pain.  Denies any shortness of breath.  GENITOURINARY:  Denies any dysuria or hematuria.   PHYSICAL EXAMINATION:  VITAL SIGNS:  Weight 92, height 60 inches,  temperature 98.4, pulse 78, respirations 20, blood pressure 121/57.  GENERAL:  Pleasant, thin black female in no acute distress.  SKIN:  Dry, no jaundice.  HEENT:  Pupils equal, round and reactive to light.  Conjunctivae are pink.  Sclerae are anicteric.  Oropharyngeal mucosa moist and pink.  No lesions,  erythema, or exudate.  No lymphadenopathy or thyromegaly.  CHEST:  Lungs are clear to auscultation.  HEART:  Regular rate and rhythm.  Normal S1 and S2.  No murmurs, rubs, or  gallops.  ABDOMEN:  Positive bowel sounds, soft, nontender, nondistended.  No  organomegaly or masses.  RECTAL:  No external or internal lesions, bowel secretions present.  Hemoccult card sent to the laboratory for testing.  No evidence of  impaction.  EXTREMITIES:  No edema.   IMPRESSION:  The patient is a 60 year old lady with multiple issues  including lower abdominal pain, constipation, nausea, vomiting, normocytic  anemia, a 27 pound weight loss since August of 2005.  She is found to have  an elevated sedimentation rate of 72, a TSH of 0.120.  The remaining thyroid panel is pending.  Unenhanced CT revealed a 1 mm left renal stone, and  atherosclerotic  calcification of the abdominal aorta, but otherwise  insignificant.  At this point, it is really unclear whether the  constellation of her gastrointestinal symptoms are all related.  She does  have a normocytic anemia, and, given her age, despite her Hemoccult status,  would recommend colonoscopy.  Given her nausea and vomiting, would consider  EGD, as well.  Obviously, her sedimentation rate is elevated, and this is a  nonspecific but significant finding.   RECOMMENDATIONS:  1.  Consider colonoscopy and EGD.  Dr. Dionicia Abler to see patient initially.  2.  Follow up Hemoccults and pending labs.  3.  LFT's in the morning.  4.  Will add Protonix.  5.  Further recommendations to follow.     Lesl   LL/MEDQ  D:  09/03/2004  T:  09/03/2004  Job:  [id number]   cc:   Tesfaye D. Felecia Shelling, MD  [address]  [place]  [place] [address]  Fax: [phone]   Vida Roller, M.D.  Fax: [phone]

## 2011-02-06 NOTE — Discharge Summary (Signed)
   NAME:  [name], [name]                      ACCOUNT NO.:  [id number]   MEDICAL RECORD NO.:  [id number]                   PATIENT TYPE:  INP   LOCATION:  A228                                 FACILITY:  [facility]   PHYSICIAN:  Tesfaye D. Felecia Shelling, M.D.              DATE OF BIRTH:  01-04-1951   DATE OF ADMISSION:  05/03/2003  DATE OF DISCHARGE:  05/06/2003                                 DISCHARGE SUMMARY   DISCHARGE DIAGNOSIS:  Pyelonephritis.   SECONDARY DIAGNOSES:  1. Coronary artery disease.  2. History of cerebral vascular accident.  3. Amputation.  4. Hyperlipidemia.  5. History of psychiatric disorder.   DISCHARGE MEDICATIONS:  1. Clonidine 0.3 mg p.o. b.i.d.  2. Toprol-XL 100 mg p.o. q.d.  3. Pravachol 20 mg p.o. q.h.s.  4. Norvasc 10 mg p.o. q.d.  5. Ditropan 5 mg p.o. q.d.  6. Aspirin 81 mg p.o. q.d.  7. Plavix 75 mg p.o. q.d.  8. Avapro 300 mg p.o. q.d.  9. Seroquel 50 mg p.o. q.d.  10.      Pepcid 25 mg p.o. q.d.  11.      Xanax 0.25 mg p.o. b.i.d.  12.      Ambien 10 mg p.o. q.h.s.  13.      Levaquin 500 mg p.o. q.d. for five days.  14.      Acetaminophen 650 mg two tablets p.o. q.6h. p.r.n.   DISPOSITION:  The patient will be discharged to the free clinic to continue  her regular treatment.   HOSPITAL COURSE:  This is a 60 year old female patient with history of  multiple medical illnesses who was admitted to [facility] on May 03, 2003 due to fever, chills, dysuria, urgency, and frequency of urination.  She was admitted as a case of left pyelonephritis. Her urinalysis grew e  coli which is pansensitive. The patient was initially started on Rocephin.  She is currently being discharged on Levaquin 500 mg p.o. q.d. The patient  improved, and her fever has subsided. She will continue follow up with the  free clinic.                                              Tesfaye D. Felecia Shelling, M.D.   TDF/MEDQ  D:  05/06/2003  T:  05/06/2003  Job:  [id number]

## 2011-02-06 NOTE — Consult Note (Signed)
NAMEMARIELLE, [name]                      ACCOUNT NO.:  [id number]   MEDICAL RECORD NO.:  [id number]                   PATIENT TYPE:  INP   LOCATION:  3707                                 FACILITY:  [facility]   PHYSICIAN:  Salvadore Farber, M.D.             DATE OF BIRTH:  02-03-1951   DATE OF CONSULTATION:  03/06/2003  DATE OF DISCHARGE:                                   CONSULTATION   REQUESTING PHYSICIAN:  Cecil Cranker, M.D.   REASON FOR CONSULTATION:  Malignant hypertension with questionable renal  artery stenosis.   HISTORY OF PRESENT ILLNESS:  [name] is a 60 year old lady with  coronary disease.  She has a chronically occluded right coronary artery  managed with medical therapy.  On March 01, 2003, she presented with chest  discomfort occurring in a setting of a blood pressure of 213/89 to 210/107.  Troponin was intermediate at 0.04.  The plan has been for medical therapy.  Given her markedly elevated blood pressure despite stated compliance with  her medications, the question of renal artery stenosis was raised.  An MRA  obtained in the hospital demonstrated mild left renal artery stenosis with  questionable right renal artery stenosis of unclear severity at the  bifurcation of the renal artery.  There were single renal arteries  bilaterally.   During her hospitalization, her medications have been adjusted such that her  blood pressure is now well controlled.  She has had no recurrent chest  discomfort during the hospitalization.  Creatinine has been 0.9 to 1.0.   PAST MEDICAL HISTORY:  Coronary artery disease status post non-ST segment  elevation, myocardial infarction March 2004, prior stenting of the RCA in  1997, hypertension, dyslipidemia.   ALLERGIES:  PENICILLIN causes a rash, MORPHINE causes a rash, questionable  CONTRAST ALLERGY.   CURRENT MEDICATIONS:  1. Toprol XL 100 mg per day.  2. Zocor 10 mg per day.  3. Aspirin 81 mg per day.  4. Avapro  300 mg per day.  5. Hydrochlorothiazide 25 mg per day.  6. Ditropan 5 mg per day.  7. Norvasc 10 mg per day.  8. Clonidine 0.3 mg twice per day.  9. Colace 1 tab daily.  10.      Nicotine patch.  11.      Ambien 10 mg q.h.s.  12.      Xanax 0.25 mg b.i.d.   SOCIAL HISTORY:  The patient is divorced, with 3 children.  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  She has a 40 pack/year history of tobacco and is  currently smoking a half pack per day.  Denies alcohol use.  Smokes  marijuana several days per week.   FAMILY HISTORY:  Noncontributory.   PHYSICAL EXAMINATION:  This is a generally well appearing, thin woman in no  distress with heart rate in the 60s, blood pressure 130/60.  Oxygen  saturation is 96% on  room air.  She has no jugular venous distention.  LUNGS:  Clear to auscultation.  HEART:  Regular rate and rhythm without murmur, S3, or S4.  ABDOMEN:  Soft, nondistended, nontender.  There are normal bowel sounds.  There is no hepatosplenomegaly.  EXTREMITIES:  Warm without clubbing, cyanosis, edema, or ulceration.  Carotid pulses are 2+ bilaterally without bruits.  There are no abdominal  bruits.  Femoral pulses are 2+ bilaterally.  There is a soft, right femoral  bruit.  Popliteal pulses are trace bilaterally.  Dorsalis pedis pulses are  2+ on the right and 1+ on the left.  Posterior tibial pulses are 1+  bilaterally.   LABORATORY STUDIES:  Remarkable for a creatinine of 1.0 and a potassium of  4.1 today.  MRA:  Reviewed by myself.  Mild osteo stenosis of the left renal artery.  Questionable right renal artery stenosis of unclear severity occurring at  the bifurcation.   IMPRESSION/RECOMMENDATIONS:  A 60 year old lady with severe hypertension  provoking angina pectoris.  The blood pressure is now controlled on 5  medications.  An MRA is suggestive, but not diagnostic, of right renal  artery stenosis.  As she is currently requiring 5 medications and elevation  of blood  pressure has provoked angina pectoris, I would favor renal  revascularization if in fact she does have severe renal artery stenosis.  To  make a definitive diagnosis, I have recommended renal angiography with  externe.  We will schedule this for approximately 1 week's time.  In the  meantime, we will continue her current medications which appear to have her  blood pressure under good control.   Thank you for allowing to me to participate in her care.                                                Salvadore Farber, M.D.    WED/MEDQ  D:  03/06/2003  T:  03/06/2003  Job:  [id number]   cc:   Cecil Cranker, M.D.   Pricilla Riffle, M.D.

## 2011-02-06 NOTE — H&P (Signed)
NAME:  [name], [name]                      ACCOUNT NO.:  [id number]   MEDICAL RECORD NO.:  [id number]                   PATIENT TYPE:  INP   LOCATION:  A228                                 FACILITY:  [facility]   PHYSICIAN:  Tesfaye D. Felecia Shelling, M.D.              DATE OF BIRTH:  February 22, 1951   DATE OF ADMISSION:  05/14/2004  DATE OF DISCHARGE:                                HISTORY & PHYSICAL   CHIEF COMPLAINT:  Chest pain.   HISTORY OF PRESENT ILLNESS:  This is a 60 year old female patient with a  history of multiple medical illnesses who came to the emergency room with a  complaint of chest pain.  The patient is known to have coronary artery  disease, and she had a stent placement.  The patient also had a cardiac  catheterization, in 2004, where she was found to have blockage of her right  coronary artery.  However, medical treatment has been recommended.  The  patient has been taking her medications.  She has intermittent chest pain  which is being controlled by nitroglycerin.  The patient has not seen a  cardiologist in a year's time.  The patient felt pain in her left chest  which was a burning type.  She took nitroglycerin.  The pain slightly eased  but it continued to bother her.  She was brought to the emergency room where  she was evaluated.  The patient had an EKG and cardiac enzymes done in the  emergency room.  Her troponin and CPK-MB was within normal limits.  There  was a slight elevation in her myoglobin.  The patient was given  nitroglycerin and her pain was relieved.  She was, however, admitted for  further evaluation.  Currently, the patient is pain free and she wants to go  home.   REVIEW OF SYSTEMS:  There is no headache, nausea, vomiting, abdominal pain,  fever, cough, diaphoresis, or shortness of breath.  No dysuria, urgency, or  frequency of urination.   PAST MEDICAL HISTORY:  1. Coronary artery disease.  2. History of myocardial infarction in 1997.  3. Status  post stent placement.  4. Hypertension.  5. Hyperlipidemia.  6. Major depression disorder with psychotic feature.  7. History of cerebrovascular accident.   CURRENT MEDICATIONS:  1. Pravachol 20 mg p.o. every day.  2. Oxybutynin 5 mg p.o. every day.  3. Nitroglycerin 0.4 sublingual p.r.n.  4. Plavix 75 mg p.o. every day.  5. Ferrous sulfate 325 mg p.o. b.i.d.  6. Colace 100 mg p.o. b.i.d.  7. Norvasc 5 mg p.o. every day.  8. Toprol XL 50 mg p.o. every day.  9. Multivitamin one tablet p.o. every day.  10.      Aspirin 81 mg p.o. every day.  11.      Clonidine 0.5 mg p.o. b.i.d.  12.      Avalide 300/12.5 mg one tablet p.o. every day.  13.  Seroquel 200 mg p.o. q.h.s.   SOCIAL HISTORY:  The patient smokes about one pack of cigarettes per day.  She has a history of marijuana use.  No history of alcohol.   PHYSICAL EXAMINATION:  GENERAL:  The patient is alert, awake, and __________  .  VITAL SIGNS:  Blood pressure 86/50, pulse 65, respiratory rate 20,  temperature 98.  HEENT:  Pupils are equal, reactive.  NECK:  Supple.  CHEST:  Decreased air entry, a few rhonchi.  CARDIOVASCULAR:  First and second heart sounds heard.  No murmur.  No  gallop.  ABDOMEN:  Soft and lax.  Bowel sounds are positive.  No mass.  No  organomegaly.  EXTREMITIES:  No leg edema.   LABS:  CBC:  WBC 9.6, hemoglobin 8.4, hematocrit 24.2, platelets 266.  PT  14.7, INR 1.2.  Sodium 135, potassium 2.9, __________  106, carbon dioxide  27, glucose 105, BUN 12, creatinine 1.2, and calcium 7.9.  CPK 93, CK-MB  2.3, and troponin 0.05.   ASSESSMENT:  1. Chest pain. Rule out unstable angina.  2. History of coronary artery disease, and status post myocardial infarction     in 1997, and status post stent placement.  3. Hyperlipidemia.  4. Hypertension.  5. History of cerebrovascular accident.  6. Major depression disorder with psychotic features.  7. Nicotine addiction.  8. Anemia, etiology not clear at  this time.   PLAN:  We will do serial EKGs and cardiac enzymes.  We will do iron studies.  We will continue to do occult stool test.  We will monitor her CBC.  We will  continue her regular medications.  If her enzymes and EKG remain negative,  we will discharge the patient and make a cardiology followup in the  cardiology clinic for further evaluation.     ___________________________________________                                         Eustaquio Maize Felecia Shelling, M.D.   TDF/MEDQ  D:  05/14/2004  T:  05/14/2004  Job:  [id number]

## 2011-02-06 NOTE — Procedures (Signed)
NAMELATORI, BEGGS            ACCOUNT NO.:  [id number]   MEDICAL RECORD NO.:  [id number]          PATIENT TYPE:  INP   LOCATION:  A223                          FACILITY:  [facility]   PHYSICIAN:  Vida Roller, M.D.   DATE OF BIRTH:  06-Jul-1951   DATE OF PROCEDURE:  09/04/2004  DATE OF DISCHARGE:                                  ECHOCARDIOGRAM   TAPE:  [id number].   TAPE COUNT:  1168 through 1617.   This is an evaluation for syncope.  Previous echo done in April, 2004 shows  normal left ventricular systolic function, essentially normal heart without  any significant valvular heart disease.  Today's study is technically  slightly difficult.   M-MODE TRACINGS:  The aorta is 24 mm.  Left atrium is 29 mm.  The septum is  11 mm.  The posterior wall is 10 mm.  Left ventricular diastolic dimension  is 35 mm.  Left ventricular systolic dimension is 26 mm.   2D AND DOPPLER IMAGING:  The left ventricle is normal size with normal  systolic function.   Estimated ejection fraction is 65-70%.  There are no wall motion  abnormalities.   The right ventricle is normal in size with normal systolic function.   Both atria appear to be normal size.   The aortic valve is slightly sclerotic with no evidence of insufficiency or  stenosis.   The mitral valve has mild myxomatous changes but no significant stenosis and  trivial insufficiency.   The tricuspid valve had trivial to mild regurgitation.   Pulmonic valve is not well seen.   There is no pericardial effusion.   Inferior vena cava appears to be normal size.   Ascending aorta was not well seen.     Trey Paula   JH/MEDQ  D:  09/04/2004  T:  09/04/2004  Job:  [id number]

## 2011-02-06 NOTE — H&P (Signed)
[name], [name]            ACCOUNT NO.:  [id number]   MEDICAL RECORD NO.:  [id number]          PATIENT TYPE:  INP   LOCATION:  A223                          FACILITY:  [facility]   PHYSICIAN:  Tesfaye D. Felecia Shelling, MD   DATE OF BIRTH:  1951-04-19   DATE OF ADMISSION:  09/02/2004  DATE OF DISCHARGE:  LH                                HISTORY & PHYSICAL   CHIEF COMPLAINT:  Dizziness.   HISTORY OF PRESENT ILLNESS:  This is a 60 year old female patient with a  history for multiple medical illnesses, was directly admitted from  cardiology clinic due to a syncopal episode.  The patient has been taking  several blood pressure medicines.  She had an episode of weakness and  dizziness over the last two weeks.  The patient also lost about 15 pounds  over the last two months.  He appetite has been very poor.  She was  generally feeling weak.  The patient was seen in the cardiology clinic today  where she was evaluated and was found to have a significant orthostatic  hypotension.  The patient was then directly admitted and a workup started.   REVIEW OF SYSTEMS:  The patient has no fever, headache, chills, cough,  shortness of breath, nausea, vomiting, abdominal pain, dysuria, urgency, or  frequency of urination.   PAST MEDICAL HISTORY:  1.  History of noncardiac chest pain.  2.  History of coronary artery disease.  3.  Hypertension.  4.  Anemia.  5.  History of cerebrovascular accident.  6.  Depression disorder.  7.  Renal stenosis.   CURRENT MEDICATIONS:  1.  Seroquel 300 mg p.o. q.h.s.  2.  Clonidine 0.3 mg p.o. b.i.d.  3.  Toprol 100 mg p.o. every day.  4.  Pravachol 20 mg p.o. every day.  5.  Avalide 300 mg/12.5 p.o. every day.  6.  Norvasc 10 mg p.o. every day.  7.  Detrol 5 mg p.o. every day.  8.  Aspirin 81 mg p.o. every day.  9.  Nitroglycerin 0.4 mg sublingual p.r.n.  10. Plavix 75 mg p.o. every day.  11. Multivitamin one tablet p.o. every day.  12. Zoloft 100 mg p.o. every  day.   SOCIAL HISTORY:  The patient smokes about one pack of cigarettes per day.  She has a history of use of marijuana.  No history of alcohol.  The patient  is currently disabled due to her illness.   PHYSICAL EXAMINATION:  GENERAL:  The patient is alert, awake, and  chronically sick-looking.  VITAL SIGNS:  Blood pressure 88/56, pulse 80, respiratory rate 20,  temperature 98 degrees Fahrenheit.  HEENT:  Pupils are equal reactive.  NECK:  Supple.  CHEST:  Decreased air entry, bilateral rhonchi.  CARDIOVASCULAR:  First and second heart sounds heard.  Regular.  ABDOMEN:  Soft and lax.  Bowel sounds are positive.  No mass, no  organomegaly.  EXTREMITIES:  No leg edema.   LABS:  CBC:  WBC 14.3, hemoglobin 10.8, hematocrit 32.5, and platelets of  305.  Sodium 137, potassium 3.6, chloride 101, carbon dioxide 25, glucose  103, BUN 18, creatinine 1.6.  Bilirubin 0.6, alkaline phosphatase 158, AST  22, ALT 25, total protein 7.9, albumin 3.4, calcium 4.8.   ASSESSMENT:  1.  Syncopal episode, probably secondary to multiple antihypertensive      medications.  2.  Excessive unwanted weight loss with poor oral intake, etiology not clear      at this time.  3.  History of major depressive disorder.  4.  Coronary artery disease.  5.  Hypertension.  6.  History of cerebrovascular accident.  7.  Hyperlipidemia.  8.  Anemia of chronic disease.   PLAN:  1.  We will continue the patient on IV fluids as recommended by cardiology.  2.  We will hold her blood pressure medications.  3.  We will do a CT scan of the abdomen for her unexplained weight loss.  4.  We will continue her regular medications.  5.  We will monitor her electrolytes and CBC.     Tesf   TDF/MEDQ  D:  09/02/2004  T:  09/02/2004  Job:  [id number]

## 2011-02-06 NOTE — Op Note (Signed)
   NAME:  [name], BROECKER                     ACCOUNT NO.:  [id number]   MEDICAL RECORD NO.:  [id number]                   PATIENT TYPE:  OIB   LOCATION:                                       FACILITY:  [facility]   PHYSICIAN:  Salvadore Farber, M.D.             DATE OF BIRTH:  1951/05/15   DATE OF PROCEDURE:  03/16/2003  DATE OF DISCHARGE:                                 OPERATIVE REPORT   PROCEDURE:  Abdominal aortography without runoff.   CARDIOLOGIST:  Salvadore Farber, M.D.   INDICATIONS:  This patient is a 60 year old lady with severe hypertension,  currently requiring 5 medications. She recently presented with chest  discomfort.  Coronary angiography demonstrated chronically occluded RCA and  plan is for medical management of that.  An MRA was suggestive, but not  diagnostic of right renal artery stenosis.  She is brought for abdominal  aortography to exclude renal artery stenosis as a contributor to her  hypertension.   PROCEDURAL TECHNIQUE:  Informed consent was obtained.  Under 1% lidocaine  local anesthesia a 5 French sheath was placed in the right femoral artery  using the modified Seldinger technique.  A 5 French pigtail catheter was  advanced into the suprarenal abdominal aorta.  Abdominal aortography was  performed by primary injection using digital traction angiography.   The patient tolerated the procedure well and was transferred to the holding  room in stable condition.  She is to be ___________ there.   COMPLICATIONS:  None.   FINDINGS:  1. Abdominal aorta mild narrowing distally.  There is no significant     stenosis.  2. Renal arteries.  Single renal arteries bilaterally.  Both are normal.     The region in question at the site of the bifurcation of the right renal     artery has a sharp bend, but no stenosis.   IMPRESSION/PLAN:  Normal single renal arteries bilaterally.  Plan: Continued  medical therapy for her hypertension.                           Salvadore Farber, M.D.    WED/MEDQ  D:  03/16/2003  T:  03/17/2003  Job:  [id number]   cc:   Pricilla Riffle, M.D.

## 2011-02-06 NOTE — Discharge Summary (Signed)
[name], [name]            ACCOUNT NO.:  [id number]   MEDICAL RECORD NO.:  [id number]          PATIENT TYPE:  INP   LOCATION:  A223                          FACILITY:  [facility]   PHYSICIAN:  Tesfaye D. Felecia Shelling, MD   DATE OF BIRTH:  Jun 22, 1951   DATE OF ADMISSION:  09/02/2004  DATE OF DISCHARGE:  12/20/2005LH                                 DISCHARGE SUMMARY   DISCHARGE DIAGNOSES:  1.  Syncopal episode secondary to hypotension.  2.  Chronic anemia.  3.  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  4.  Low TSH level, rule out thyroid toxicosis.  5.  History of hypertension.  6.  History of cerebral vascular accident.  7.  Renal artery stenosis.  8.  History of recurrent chest pain, etiology unclear.   DISCHARGE MEDICATIONS:  1.  Plavix 75 mg p.o. q.d.  2.  Aspirin 81 mg p.o. q.d.  3.  Zoloft 100 mg p.o. q.d.  4.  Ditropan 5 mg p.o. q.d.  5.  Toprol 50 mg p.o. q.d.  6.  Pravachol 20 mg p.o. q.d.  7.  Protonix 40 mg p.o. q.d.  8.  Remeron 15 mg p.o. q.d.  9.  Megace 800 mg p.o. q.d.   DISPOSITION:  The patient was discharged to home in stable condition.   HOSPITAL COURSE:  This is a 60 year old female patient with a history of  multiple medical illnesses who was admitted to [facility] on  September 02, 2004 from cardiology clinic due to dizziness and episode of  syncope. She had orthostatic hypotension. The patient had an extensive  workup after admission. Her TSH was found to be low. The patient also had  one result of low cortisone level in the morning. She was on multiple  antihypertensive medications. Her medications were adjusted. She  symptomatically improved. GI evaluation for chronic anemia showed no acute  bleeding. The patient is discharged in stable condition to have further  followup on her labs on outpatient basis.      TDF/MEDQ  D:  10/16/2004  T:  10/16/2004  Job:  [id number]

## 2011-02-06 NOTE — H&P (Signed)
NAMECAMALA, [name]                      ACCOUNT NO.:  [id number]   MEDICAL RECORD NO.:  [id number]                   PATIENT TYPE:  INP   LOCATION:  3707                                 FACILITY:  [facility]   PHYSICIAN:  Veneda Melter, M.D.                   DATE OF BIRTH:  08/03/1951   DATE OF ADMISSION:  03/01/2003  DATE OF DISCHARGE:                                HISTORY & PHYSICAL   CHIEF COMPLAINT:  Chest pain.   HISTORY:  [name] is a 60 year old black female with a history of  coronary disease who presents with substernal chest discomfort and  hypertension.  The patient had suffered a myocardial infarction in 1997 and  at that time apparently had stent placement to the right coronary artery.  She represented with a small myocardial infarction in March 2004 and  underwent cardiac catheterization at that time.  She was found to have  occlusion of the right coronary artery with overall preserved LV function.  Medical therapy was recommended with consideration towards percutaneous  coronary intervention should she have recurrent symptoms.  The patient notes  almost daily chest pain since her hospitalization.  This occurs with  exertion or with emotional stress.  She has had several episodes at rest.  These are relieved with nitroglycerin; however, she has been reluctant to  use these due to severe headaches.  Today, she had worse pain than usual and  presented to [facility].  She was found to be hypertensive with  systolic blood pressure greater than 200 mmHg.  Her pain was relieved with  nitroglycerin.  Initial cardiac enzymes were negative.  ECG showed  nonspecific changes.  The patient wished to be transferred to [facility]  for further treatment.  Currently, she is pain free.  She does note some  shortness of breath with chest discomfort.  She denied any nausea or  vomiting, palpitations, syncope, or presyncope.   REVIEW OF SYSTEMS:  Otherwise  noncontributory.   PAST MEDICAL HISTORY:  1. Coronary artery disease, status post myocardial infarction in 1997 with     stent to the right coronary artery, recurrent chest pain, non-Q wave     myocardial infarction December 09, 2002.  2. Hypertension.  3. Dyslipidemia.   ALLERGIES:  1. PENICILLIN which causes rash.  2. MORPHINE which causes rash.  3. Questionable CONTRAST allergy.   MEDICATIONS:  1. Clonidine 0.2 mg b.i.d.  2. Toprol XL 100 mg daily.  3. Pravachol 20 mg q.h.s.  4. Premarin 0.625 mg daily.  5. Aspirin 81 mg daily.  6. Diovan/HCTZ unclear dosage 1 tablet daily.  7. Ditropan 5 mg daily.   SOCIAL HISTORY:  The patient is divorced, three children.  She has a 40 pack  years of tobacco, currently smoking one-half pack per day.  Denies alcohol  use.  History of marijuana use, none recently.   FAMILY HISTORY:  Noncontributory.  PHYSICAL EXAMINATION:  GENERAL:  A well-developed, well-nourished black  female in no acute distress.  VITAL SIGNS:  Temperature of 97.7, blood pressure 217/93, heart rate is 59,  respirations 20, O2 saturations 95% on room air.  HEENT:  Pupils are equal, round, and reactive to light.  Extraocular muscles  are intact.  Oropharynx:  There are no lesions.  NECK:  Supple.  No adenopathy.  HEART:  Regular rate without murmurs.  LUNGS:  Clear to auscultation.  ABDOMEN:  Soft and nontender.  EXTREMITIES:  No edema.  Peripheral pulses are palpable and equal  bilaterally.   LABORATORY DATA:  White count 8.8, hemoglobin 9.4, hematocrit 27.8,  platelets 268.  Sodium is 141, potassium 3.7, chloride 104, bicarb 30, BUN  is 5, creatinine 0.9, glucose 137.  PT is 13.9, INR of 1, PTT is 35.  Initial CK is 79, MB is 1.1, troponin-I is 0.04.   Chest x-ray on Feb 08, 2003 shows no infiltrates or effusions, no masses.  ECG today shows normal sinus of 70 with nonspecific T-wave flattening  inferolateral leads.  No acute ST changes.   ASSESSMENT AND PLAN:   [name] is a 60 year old black female with poorly  controlled hypertension, coronary artery disease, and unstable angina.  The  patient will be admitted to the hospital for medical stabilization.  We will  advance her antihypertensives and antianginal medications as needed.  We  will rule out acute myocardial infarction with serial cardiac enzymes.  Also, provide potassium supplementation for a relative hypokalemia and  guaiac her stools.  She has anemia of unclear etiology.  We will review her  angiograms.  Prior catheterization report shows occlusion of the right  coronary artery with only mild disease in the left system and overall well-  preserved left ventricular function.  Should this be amenable to  percutaneous intervention, this will be considered.  Medical therapy has  been recommended in the past; however, with her poorly controlled  hypertension this certainly is exacerbating her symptoms and perhaps with  control of her blood pressure her symptoms may be abated.                                               Veneda Melter, M.D.    NG/MEDQ  D:  03/02/2003  T:  03/02/2003  Job:  [id number]   cc:   Vida Roller, M.D.  Fax: [phone]

## 2011-02-06 NOTE — Op Note (Signed)
[name], [name]                      ACCOUNT NO.:  [id number]   MEDICAL RECORD NO.:  [id number]                   PATIENT TYPE:  AMB   LOCATION:  DAY                                  FACILITY:  [facility]   PHYSICIAN:  Barbaraann Barthel, M.D.              DATE OF BIRTH:  1951/05/08   DATE OF PROCEDURE:  01/04/2004  DATE OF DISCHARGE:                                 OPERATIVE REPORT   SURGEON:  Barbaraann Barthel, M.D.   PREOPERATIVE DIAGNOSIS:  Ganglion, left wrist.   POSTOPERATIVE DIAGNOSIS:  Ganglion, left wrist.   PROCEDURE:  Excision of ganglion, left wrist.   SPECIMENS:  Ganglion.   INDICATIONS FOR PROCEDURE:  This is a 59 year old white female who presented  with a ganglion basically on the dorsal aspect just above the wrist area  near the thenar eminence.  Clinically, this appeared to be a ganglion, and  we had planned for elective excision.  We discussed the complications, not  limited to but including bleeding, infection, numbness, and recurrence.  Informed consent was obtained.   GROSS OPERATIVE FINDINGS:  Those consistent of a ganglion.   TECHNIQUE:  The patient was placed in the supine position.  After the  adequate administration of a Bier block anesthetic, with the tourniquet  placed at 250 mmHg, a longitudinal incision was carried out over the  palpable mass on the dorsal aspect of the thenar eminence.  This was  dissected free, avoiding the digital branch of the radial nerve.  We  dissected this from around the extensor tendon of the thumb.  This was  dissected free almost in toto, lightly cauterizing the area of its insertion  on the tendon.  This was then sent as a specimen.  The wound was then  irrigated with normal saline solution, and the skin was approximated with 5-  0 nylon.  Prior to closure, all sponge, needle, and  instrument counts were found to be correct.  Estimated blood loss was  minimal.  Tourniquet time was 20 minutes.  There were no drains.   There were  no complications.  The patient was taken to the recovery room in  satisfactory condition.      ___________________________________________                                            Barbaraann Barthel, M.D.   WB/MEDQ  D:  01/04/2004  T:  01/04/2004  Job:  [id number]   cc:   Tesfaye D. Felecia Shelling, M.D.  [address]  [place]  [place] [address]  Fax: [phone]

## 2011-02-06 NOTE — Discharge Summary (Signed)
[name], [name]                      ACCOUNT NO.:  [id number]   MEDICAL RECORD NO.:  [id number]                   PATIENT TYPE:  INP   LOCATION:  3707                                 FACILITY:  [facility]   PHYSICIAN:  Veneda Melter, M.D.                   DATE OF BIRTH:  March 01, 1951   DATE OF ADMISSION:  03/01/2003  DATE OF DISCHARGE:  03/06/2003                           DISCHARGE SUMMARY - REFERRING   HISTORY OF PRESENT ILLNESS:  [name] is a 60 year old black female  who presents to the emergency room with chest discomfort.  She has had this  almost every day since her hospitalization in March.  She feels that this  occurs with exertion or with emotional stress, and has had several episodes  at rest relived with nitroglycerin.  She has been reluctant to use  nitroglycerin secondary to severe headaches.  Her discomfort was worse than  usual, although she presented to [facility].  She was found to be  hypertensive.  Her discomfort was relieved with nitroglycerin.  Her initial  enzymes were negative.  Nonspecific changes on her EKG.  The patient  requested to be transferred to [facility] for further treatment.  On transfer, she  was currently pain-free.   PAST MEDICAL HISTORY:  1. Known coronary artery disease with a myocardial infarction in 1997, and     stent to the right coronary artery.  2. She had recurrent chest discomfort with a non-Q-wave myocardial     infarction on December 09, 2002.  At that time, she was found to have an     occlusion of the right coronary artery with overall preserved left     ventricular function, and medical therapy was recommended with     consideration towards PCI for recurrent symptoms.  3. Hypertension.  4. Dyslipidemia.  5. Tobacco use.  6. Marijuana use.   LABORATORY DATA:  On admission, hemoglobin and hematocrit is 9.4 and 27.8,  MCV 101.3, platelets 268, white blood cell count 8.8.  Subsequent  hematologies were unremarkable  except for on March 06, 2003, her hemoglobin  and hematocrit were 11.4 and 34, with a MCV of 104.1.  On March 02, 2003,  reticulocyte count was 3.6, red blood cells 2.61, absolute reticulocytes 94.  PT 13.9, PTT 35.  Sodium 141, potassium 3.7, BUN 5, creatinine 0.9, glucose  137.  Subsequent chemistries were essentially unremarkable.  A serial of  four CK-MB's and one troponin was negative for myocardial infarction.  Fasting lipids showed a total cholesterol of 160, triglycerides 141, HDL 50,  LDL 82.  Her iron was slightly low at 40, ferritin at 205, and transferrin  was low at 173.  Homocystine was 11.16.  Urine drug screen on admission was  positive for benzo's, opiates, and THC (it is noted that she did receive  morphine in the emergency department).  Urinalysis was unremarkable.  Chest  x-ray did  not show any active disease.  MRI showed possible stenosis of the  bifurcation of the right main renal artery.  Consider catheter angiography  to confirm as this may be amenable to percutaneous intervention.  There is  also a focal stenosis at the origin of the left external iliac.  EKG showed  normal sinus rhythm, normal axis, delayed R-wave, nonspecific ST-T wave  changes.   HOSPITAL COURSE:  Ms. [name] was transferred from [facility] and  admitted to 3700 by Dr. Chales Abrahams.  Dr. Chales Abrahams noted that she had poorly  controlled hypertension and probable unstable angina.  However, at the time  she was currently pain-free.  Dr. Chales Abrahams made arrangements to review her  angiogram to consider coronary artery bypass grafting or intervention.  The  patient now wishes to have surgery or PCI.  Dr. Chales Abrahams also questioned  medical compliance.  Overnight, the patient did not have any further chest  discomfort.  Dr. Tenny Craw saw her the following day and added Norvasc for her  hypertension.  She also arranged for labs to be drawn in regards to fasting  lipids, homocystine, and anemia studies.  By March 03, 2003,  she remained  pain-free, her blood pressure still remained elevated, and a MRA showed  possible renal artery stenosis.  Over the weekend, the patient did have some  episodes of discomfort where she was requesting pain medications.  The  patient was unable to describe or rate the discomfort, and refuse to have  her vital signs checked.  Over the next several days, her blood pressure  improved with the addition of medications.  Smoking consult was obtained on  March 05, 2003.  By March 06, 2003, the patient was doing much better in  regards to her blood pressure.  Dr. Samule Ohm saw her in consultation for  peripheral vascular medication.  Dr. Samule Ohm felt that her severe  hypertension provoked unstable angina.  Her blood pressure was much better  controlled on five medications.  Dr. Samule Ohm plans a renal angiogram with  possible intervention of renal artery stenosis later next week.  His office  will call her to arrange followup.   DISCHARGE DIAGNOSES:  1. Hypertension.  2. Unstable angina.  3. Question compliance.  4. Continued tobacco use.  5. Known coronary artery disease as previously described.  6. Treated hyperlipidemia.   DISCHARGE MEDICATIONS:  1. Avapro 300 mg daily.  2. Hydrochlorothiazide 25 mg daily.  3. Norvasc 10 mg daily.  4. Her Clonidine was increased to 0.3 mg b.i.d.  5. Toprol XL 100 mg daily.  6. She was asked to continue her Pravachol 20 mg q.h.s.  7. Ditropan 5 mg daily.  8. Coated aspirin 81 mg daily.  9. Nitroglycerin p.r.n.  She was given instructions that she may use the Nitro-Dur patches with  anticipation of her quitting tobacco.   DIET:  She is advised a low salt, fat, and cholesterol diet.   DISCHARGE INSTRUCTIONS:  She is instructed not to continue her Premarin, no  smoking, or drug use.   FOLLOWUP:  1. Dr. Samule Ohm will call her next week with a follow up appointment in    regards to evaluating her renal arteries.  2. She will see Dr. Dorethea Clan in the  [place] office on Tuesday, March 20, 2003, at 11 a.m.   Prior to discharge, samples of Avapro and Norvasc from our [place] office  will be given to her.  She was also instructed that she  may pick up some  Norvasc samples from the [place] office tomorrow.  The hospital will  provide her $50.00 worth of hydrochlorothiazide.  She states that she has  the remainder of her medications at home.      Joellyn Rued, P.A. [facility]                    Veneda Melter, M.D.    EW/MEDQ  D:  03/06/2003  T:  03/06/2003  Job:  [id number]   cc:   [facility] [place] [place], M.D.  Fax: [phone]   Salvadore Farber, M.D.    cc:   [facility] [place], M.D.  Fax: [phone]   Salvadore Farber, M.D.

## 2011-02-06 NOTE — Procedures (Signed)
NAMEANJULI, GEMMILL                      ACCOUNT NO.:  [id number]   MEDICAL RECORD NO.:  [id number]                   PATIENT TYPE:  INP   LOCATION:  A228                                 FACILITY:  [facility]   PHYSICIAN:  Vida Roller, M.D.                DATE OF BIRTH:  1951-08-19   DATE OF PROCEDURE:  05/16/2004  DATE OF DISCHARGE:  05/16/2004                                    STRESS TEST   BRIEF HISTORY:  Ms. [name] is a pleasant 60 year old female who was  admitted to [facility], on May 14, 2004, for evaluation of chest  pain.  She has known coronary artery disease.  She had an MI with stents  placed in her right coronary artery in 1997.  She also has a history of  hypertension as well as hypotension.  She underwent cardiac catheterization  in 1994, that revealed a 40-50% stenosis of the RCA stents.   Other history for this patient significant for:  1. CVA in the past.  2. She has an ejection fraction of 55-60%.  3. History of anemia.  4. History of elevated lipids.  5. History of nonobstructive renal artery stenosis.  6. History of ongoing tobacco use.   Prior to the study today, the patient had no complaints.   Her baseline EKG showed a sinus rhythm, rate 73 beats per minute without  ischemia.  Her blood pressure was 118/52.   Adenosine was administered minutes 1-4.  Cardiolite was given at 3 minutes.  The patient did experience some chest discomfort and dyspnea during the  study.  Her EKG showed occasional PVCs with slight inferior ST segment  depression both the patient's symptoms and the EKG changes resolved in  recovery.  The final images are pending at the time of this dictation.     ________________________________________  ___________________________________________  Delton See, P.A. [facility]                   Vida Roller, M.D.   DR/MEDQ  D:  05/16/2004  T:  05/17/2004  Job:  [id number]   cc:   Tesfaye D. Felecia Shelling, M.D.  [address]  [place]  [place] [address]  Fax: [phone]

## 2011-02-06 NOTE — Op Note (Signed)
NAMEKIMBLERY, [name]            ACCOUNT NO.:  [id number]   MEDICAL RECORD NO.:  [id number]          PATIENT TYPE:  INP   LOCATION:  A223                          FACILITY:  [facility]   PHYSICIAN:  Lionel December, M.D.    DATE OF BIRTH:  1951-08-11   DATE OF PROCEDURE:  09/05/2004  DATE OF DISCHARGE:                                 OPERATIVE REPORT   PROCEDURE:  Esophagogastroduodenoscopy.   INDICATIONS:  Kariah is a 60 year old African-American female with multiple  medical problems who is admitted following a syncopal episode, possibly due  to orthostatic hypertension secondary to antihypertensives. She has  anorexia, early satiety, and unexplained weight loss. She is undergoing  diagnostic esophagogastroduodenoscopy. Procedure risks were reviewed with  the patient and informed consent was obtained.   PREOPERATIVE MEDICATIONS:  Demerol 25 mg IV, Versed 8 mg in divided doses.  Cetacaine spray for pharyngeal topical anesthesia.   FINDINGS:  Procedure performed in endoscopy suite. The patient's vital signs  and O2 saturations were monitored during the procedure and remained stable.  The patient was placed in left lateral position, and Olympus video scope was  passed via oropharynx without any difficulty into the esophagus.   Esophagus:  There was patchy, cheesy exudate involving proximal and distal  segment and some in the mid portion as well. Endoscopic appearance was felt  to be typical of Candida esophagitis. Brushing was taken for pH prep. GE  junction was located at 40 cm from the incisors and appeared to be  unremarkable.   Stomach:  It was empty and distended very well with insufflation. Folds of  the proximal stomach were normal. Examination of the mucosa revealed few  erosions at antrum but ulcer or mass was noted. Pyloric channel was patent.  Angularis, fundus and cardia were examined by retroflexing the scope and  were normal.   Duodenum:  The bulbar mucosa was normal.  Scope was passed to the second part  of the duodenum, and mucosa and folds were normal. Endoscope was withdrawn.   The patient tolerated the procedure well.   FINAL DIAGNOSES:  1.  Patchy exudate coating esophageal mucosa, very suspicious for Candida      esophagitis. Brushing taken for KOH prep.  2.  Erosive gastritis.   RECOMMENDATIONS:  1.  Helicobacter pylori serology.  2.  HIV testing.  3.  Mycostatin suspension 500,000 units swish and swallow q.i.d. for 2      weeks.     Naje   NR/MEDQ  D:  09/05/2004  T:  09/05/2004  Job:  [id number]   cc:   Vida Roller, M.D.  Fax: [phone]

## 2011-02-06 NOTE — Discharge Summary (Signed)
NAMEDEVANN, CRIBB                      ACCOUNT NO.:  [id number]   MEDICAL RECORD NO.:  [id number]                   PATIENT TYPE:  INP   LOCATION:  3707                                 FACILITY:  [facility]   PHYSICIAN:  Hubbard Hartshorn, M.D. [facility]              DATE OF BIRTH:  Dec 17, 1950   DATE OF ADMISSION:  12/09/2002  DATE OF DISCHARGE:  12/11/2002                           DISCHARGE SUMMARY - REFERRING   HISTORY OF PRESENT ILLNESS:  The patient is a 60 year old black female who  presents with left-sided chest burning that has been constant for the  preceding four hours. She gave it a 5 on a scale of 0 to 10.  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 There has not been any change with  position, nor is it pleuritic.  She feels this is different from her prior  episodes associated with her myocardial infarctions.  She also is  complaining of shortness of breath, nausea, diaphoresis. She denies any  syncope, palpitations, or PND. She continues to smoke tobacco products and  marijuana.  Her history is notable for prior myocardial infarction with  intervention.   LABORATORY DATA:  Admission H&H was 13.0 and 38.7, MCV was slightly elevated  at 100.8, normal MCHC. Platelets 193, WBC 11.0. PT 14.7.  Sodium 136,  potassium 4.1, BUN 7, creatinine 0.7, glucose 101.  Magnesium was slightly  low at 1.4.  CK total was 125 with MB 18.7, relative index 15.0, troponin  0.76, BNP 164.  Second CK was 205, MB 30.2, relative index 14.7, troponin  2.0. Fasting lipids showed total cholesterol 194, triglycerides 128, HDL 70,  LDL 98.  Drug screening was positive for opiates and THC.   EKG showed sinus bradycardia, normal axis, normal intervals, nonspecific ST  T wave changes.   HOSPITAL COURSE:  The patient was admitted to the hospital to rule out  myocardial infarction.  She was transferred initially from [facility]  via [place].  She continued to have chest discomfort which was  relieved  with sublingual nitroglycerin.  On March 21, she was ruling in for a non-Q  wave myocardial infarction with continued chest discomfort.  Dr. Myrtis Ser after  review felt that she should undergo cardiac catheterization, however, the  patient refused to sign consent for cardiac catheterization and stated that  she was not having the procedure done.  Dr. Myrtis Ser, Dian Queen, P.A. [facility]  continued to discuss with the patient. Throughout her stay she remained  hypertensive with her blood pressure in the low 200's systolic.  She did  remain overnight.  On March 22, she continued to have intermittent chest  discomfort, however, the patient continued to refuse cardiac  catheterization.  Dr. Dietrich Pates adjusted her medications in regards to her  hypertension and smoking cessation consult was obtained. That afternoon on  March 22, the patient insisted on leaving AMA. She removed her telemetry and  got dressed.  Dian Queen, P.A. [facility] and Dr. Andee Lineman felt that the patient  should stay to have further evaluation, however, the patient signed out AMA.     Joellyn Rued, P.A. [facility]                    Hubbard Hartshorn, M.D. [facility]    EW/MEDQ  D:  04/03/2003  T:  04/04/2003  Job:  [id number]   cc:   [facility]    cc:   [facility]

## 2011-02-06 NOTE — Procedures (Signed)
   [name], [name]                      ACCOUNT NO.:  [id number]   MEDICAL RECORD NO.:  [id number]                   PATIENT TYPE:  OUT   LOCATION:  RAD                                  FACILITY:  [facility]   PHYSICIAN:  Vida Roller, M.D.                DATE OF BIRTH:  Oct 29, 1950   DATE OF PROCEDURE:  DATE OF DISCHARGE:                                  ECHOCARDIOGRAM   TAPE NUMBER:  [id number]   TAPE COUNT:  [phone]   CLINICAL INFORMATION:  This is a 60 year old woman, born 1951-09-20, who has  had an MI, a CVA, coronary artery disease, and hypertension.   TECHNICAL QUALITY:  The technical quality of this study is adequate.   M-MODE TRACINGS:  1. The aorta is 26 mm.  2. The left atrium is 31 mm.  3. The septum is 14 mm.  4. The posterior wall is 12 mm.  5. The left ventricular diastolic dimension is 37 mm.  6. The left ventricular systolic dimension is 31 mm.   2-D AND DOPPLER IMAGING:  1. The left ventricle is normal size with normal left ventricular function.     There is mild concentric left ventricular hypertrophy.  Overall left     ventricular ejection fraction is estimated at 55-60%.  There are no wall     motion abnormalities seen.  2. The right ventricle is normal size with normal systolic function.  3. Both atria appeared normal size.  4. The aortic valve is trileaflet and tricommisural with no evidence of     stenosis or regurgitation.  5. The mitral valve is morphologically unremarkable with trace     insufficiency.  No stenosis is seen.  6. The tricuspid valve is morphologically unremarkable with trace     insufficiency.  No stenosis is seen.  7. The pulmonic valve was not well seen.  8. The aorta appears normal.  9. The pericardial structures are normal.  10.      The inferior vena cava is normal size.                                               Vida Roller, M.D.    JH/MEDQ  D:  01/15/2003  T:  01/15/2003  Job:  [id number]

## 2011-06-19 LAB — DIFFERENTIAL
Basophils Absolute: 0
Basophils Relative: 0
Eosinophils Absolute: 0
Lymphs Abs: 1.2
Monocytes Absolute: 0.2
Monocytes Relative: 1 — ABNORMAL LOW

## 2011-06-19 LAB — COMPREHENSIVE METABOLIC PANEL
ALT: 21
Albumin: 4.5
CO2: 27
Creatinine, Ser: 0.9
Potassium: 3.2 — ABNORMAL LOW
Total Protein: 8.7 — ABNORMAL HIGH

## 2011-06-19 LAB — URINALYSIS, ROUTINE W REFLEX MICROSCOPIC
Glucose, UA: NEGATIVE
Hgb urine dipstick: NEGATIVE
Nitrite: POSITIVE — AB
Protein, ur: 30 — AB
Urobilinogen, UA: 1
pH: 5

## 2011-06-19 LAB — CBC
HCT: 39.5
Hemoglobin: 13.2
MCV: 97.6
RBC: 4.05

## 2011-06-19 LAB — URINE MICROSCOPIC-ADD ON

## 2011-07-06 LAB — COMPREHENSIVE METABOLIC PANEL
ALT: 12
AST: 23
Albumin: 3.7
Calcium: 9.6
GFR calc Af Amer: 45 — ABNORMAL LOW
Potassium: 4.5
Sodium: 138
Total Protein: 7.6

## 2011-07-06 LAB — POCT CARDIAC MARKERS
Myoglobin, poc: 80.4
Operator id: 221061

## 2011-07-06 LAB — CBC
HCT: 34.8 — ABNORMAL LOW
Hemoglobin: 11.7 — ABNORMAL LOW
MCV: 99.1
RBC: 3.52 — ABNORMAL LOW
WBC: 10

## 2011-07-06 LAB — DIFFERENTIAL
Eosinophils Absolute: 0.2
Eosinophils Relative: 2
Lymphs Abs: 2.4
Monocytes Absolute: 0.5
Monocytes Relative: 5
Neutrophils Relative %: 68

## 2011-07-31 ENCOUNTER — Other Ambulatory Visit (HOSPITAL_COMMUNITY): Payer: Self-pay | Admitting: Internal Medicine

## 2011-07-31 DIAGNOSIS — Z139 Encounter for screening, unspecified: Secondary | ICD-10-CM

## 2011-08-10 ENCOUNTER — Inpatient Hospital Stay (HOSPITAL_COMMUNITY): Admission: RE | Admit: 2011-08-10 | Payer: Self-pay | Source: Ambulatory Visit

## 2011-09-06 ENCOUNTER — Inpatient Hospital Stay (HOSPITAL_COMMUNITY)
Admission: EM | Admit: 2011-09-06 | Discharge: 2011-09-09 | DRG: 391 | Disposition: A | Discharge status: Home / Self Care | Payer: Medicare Other | Attending: Internal Medicine | Admitting: Internal Medicine

## 2011-09-06 ENCOUNTER — Emergency Department (HOSPITAL_COMMUNITY): Payer: Medicare Other

## 2011-09-06 DIAGNOSIS — E785 Hyperlipidemia, unspecified: Secondary | ICD-10-CM | POA: Diagnosis present

## 2011-09-06 DIAGNOSIS — Q393 Congenital stenosis and stricture of esophagus: Secondary | ICD-10-CM

## 2011-09-06 DIAGNOSIS — R1115 Cyclical vomiting syndrome unrelated to migraine: Secondary | ICD-10-CM | POA: Diagnosis present

## 2011-09-06 DIAGNOSIS — I2510 Atherosclerotic heart disease of native coronary artery without angina pectoris: Secondary | ICD-10-CM | POA: Diagnosis present

## 2011-09-06 DIAGNOSIS — K219 Gastro-esophageal reflux disease without esophagitis: Secondary | ICD-10-CM | POA: Diagnosis present

## 2011-09-06 DIAGNOSIS — D72829 Elevated white blood cell count, unspecified: Secondary | ICD-10-CM | POA: Diagnosis present

## 2011-09-06 DIAGNOSIS — K5289 Other specified noninfective gastroenteritis and colitis: Principal | ICD-10-CM | POA: Diagnosis present

## 2011-09-06 DIAGNOSIS — Q391 Atresia of esophagus with tracheo-esophageal fistula: Secondary | ICD-10-CM

## 2011-09-06 DIAGNOSIS — E8809 Other disorders of plasma-protein metabolism, not elsewhere classified: Secondary | ICD-10-CM | POA: Diagnosis present

## 2011-09-06 DIAGNOSIS — K2970 Gastritis, unspecified, without bleeding: Secondary | ICD-10-CM | POA: Diagnosis present

## 2011-09-06 DIAGNOSIS — E860 Dehydration: Secondary | ICD-10-CM | POA: Diagnosis present

## 2011-09-06 DIAGNOSIS — K529 Noninfective gastroenteritis and colitis, unspecified: Secondary | ICD-10-CM | POA: Diagnosis present

## 2011-09-06 DIAGNOSIS — I10 Essential (primary) hypertension: Secondary | ICD-10-CM | POA: Diagnosis present

## 2011-09-06 HISTORY — DX: Anxiety disorder, unspecified: F41.9

## 2011-09-06 HISTORY — DX: Cerebral infarction, unspecified: I63.9

## 2011-09-06 HISTORY — DX: Major depressive disorder, single episode, severe with psychotic features: F32.3

## 2011-09-06 HISTORY — DX: Gastro-esophageal reflux disease without esophagitis: K21.9

## 2011-09-06 HISTORY — DX: Unspecified visual loss: H54.7

## 2011-09-06 HISTORY — DX: Acute myocardial infarction, unspecified: I21.9

## 2011-09-06 HISTORY — DX: Essential (primary) hypertension: I10

## 2011-09-06 HISTORY — DX: Hyperlipidemia, unspecified: E78.5

## 2011-09-06 LAB — COMPREHENSIVE METABOLIC PANEL
ALT: 23 U/L (ref 0–35)
Alkaline Phosphatase: 149 U/L — ABNORMAL HIGH (ref 39–117)
Chloride: 97 mEq/L (ref 96–112)
GFR calc Af Amer: >90 mL/min (ref >90)
Glucose, Bld: 160 mg/dL — ABNORMAL HIGH (ref 70–99)
Potassium: 3.7 mEq/L (ref 3.5–5.1)
Sodium: 136 mEq/L (ref 135–145)
Total Protein: 9.2 g/dL — ABNORMAL HIGH (ref 6.0–8.3)

## 2011-09-06 LAB — URINALYSIS, ROUTINE W REFLEX MICROSCOPIC
Glucose, UA: NEGATIVE mg/dL
Protein, ur: 100 mg/dL — AB
Specific Gravity, Urine: >1.03 — ABNORMAL HIGH (ref 1.005–1.030)

## 2011-09-06 LAB — CBC
HCT: 43 % (ref 36.0–46.0)
MCV: 97.9 fL (ref 78.0–100.0)
RBC: 4.39 MIL/uL (ref 3.87–5.11)
WBC: 16.5 10*3/uL — ABNORMAL HIGH (ref 4.0–10.5)

## 2011-09-06 LAB — DIFFERENTIAL
Eosinophils Relative: 0 % (ref 0–5)
Lymphocytes Relative: 14 % (ref 12–46)
Lymphs Abs: 2.3 10*3/uL (ref 0.7–4.0)
Monocytes Absolute: 0.7 10*3/uL (ref 0.1–1.0)

## 2011-09-06 LAB — URINE MICROSCOPIC-ADD ON

## 2011-09-06 MED ORDER — MORPHINE SULFATE 4 MG/ML IJ SOLN
4.0000 mg | Freq: Once | INTRAMUSCULAR | Status: AC
  Administered 2011-09-07: 4 mg via INTRAVENOUS
  Filled 2011-09-06: qty 1

## 2011-09-06 MED ORDER — ONDANSETRON HCL 4 MG/2ML IJ SOLN
4.0000 mg | Freq: Once | INTRAMUSCULAR | Status: AC
  Administered 2011-09-07: 4 mg via INTRAVENOUS
  Filled 2011-09-06: qty 2

## 2011-09-06 NOTE — ED Notes (Signed)
Pt reports n/v and lower abd pain for the past 2 days.  Pt reports inability to hold any food or liquids down.  Pt denies any GU symptoms.

## 2011-09-06 NOTE — ED Notes (Signed)
Pt reports sharp and cramping lower abdominal pain for 2 days. Pt states that she has been unable to hold anything down. Positive bowel sounds heard in all four quadrants. Pt doubled over during assessment. Pt rates abdominal pain 10/10. Pt denies any burning or pain with urination. Pt alert and oriented at this time.

## 2011-09-06 NOTE — ED Notes (Signed)
Pt reports last bm today.  Pt actively vomiting in triage.

## 2011-09-06 NOTE — ED Notes (Signed)
Pt states that she cannot give a urine sample at this time.  

## 2011-09-07 ENCOUNTER — Other Ambulatory Visit: Payer: Self-pay | Admitting: Gastroenterology

## 2011-09-07 ENCOUNTER — Encounter (HOSPITAL_COMMUNITY)
Admission: EM | Disposition: A | Discharge status: Home / Self Care | Payer: Self-pay | Source: Home / Self Care | Attending: Internal Medicine

## 2011-09-07 ENCOUNTER — Encounter (HOSPITAL_COMMUNITY): Payer: Self-pay | Admitting: Internal Medicine

## 2011-09-07 DIAGNOSIS — E860 Dehydration: Secondary | ICD-10-CM | POA: Diagnosis present

## 2011-09-07 DIAGNOSIS — R109 Unspecified abdominal pain: Secondary | ICD-10-CM

## 2011-09-07 DIAGNOSIS — R1115 Cyclical vomiting syndrome unrelated to migraine: Secondary | ICD-10-CM | POA: Diagnosis present

## 2011-09-07 DIAGNOSIS — R112 Nausea with vomiting, unspecified: Secondary | ICD-10-CM

## 2011-09-07 DIAGNOSIS — K5289 Other specified noninfective gastroenteritis and colitis: Secondary | ICD-10-CM

## 2011-09-07 DIAGNOSIS — K2970 Gastritis, unspecified, without bleeding: Secondary | ICD-10-CM

## 2011-09-07 DIAGNOSIS — K529 Noninfective gastroenteritis and colitis, unspecified: Secondary | ICD-10-CM | POA: Diagnosis present

## 2011-09-07 DIAGNOSIS — K2990 Gastroduodenitis, unspecified, without bleeding: Secondary | ICD-10-CM

## 2011-09-07 DIAGNOSIS — K222 Esophageal obstruction: Secondary | ICD-10-CM

## 2011-09-07 DIAGNOSIS — I2510 Atherosclerotic heart disease of native coronary artery without angina pectoris: Secondary | ICD-10-CM | POA: Diagnosis present

## 2011-09-07 DIAGNOSIS — E8809 Other disorders of plasma-protein metabolism, not elsewhere classified: Secondary | ICD-10-CM | POA: Diagnosis present

## 2011-09-07 HISTORY — PX: ESOPHAGOGASTRODUODENOSCOPY: SHX5428

## 2011-09-07 HISTORY — DX: Noninfective gastroenteritis and colitis, unspecified: K52.9

## 2011-09-07 LAB — COMPREHENSIVE METABOLIC PANEL
Alkaline Phosphatase: 126 U/L — ABNORMAL HIGH (ref 39–117)
BUN: 11 mg/dL (ref 6–23)
CO2: 25 mEq/L (ref 19–32)
GFR calc Af Amer: >90 mL/min (ref >90)
GFR calc non Af Amer: >90 mL/min (ref >90)
Glucose, Bld: 143 mg/dL — ABNORMAL HIGH (ref 70–99)
Potassium: 3.4 mEq/L — ABNORMAL LOW (ref 3.5–5.1)
Total Protein: 7.8 g/dL (ref 6.0–8.3)

## 2011-09-07 LAB — CBC
HCT: 37.7 % (ref 36.0–46.0)
MCH: 32.2 pg (ref 26.0–34.0)
MCV: 98.7 fL (ref 78.0–100.0)
Platelets: 275 10*3/uL (ref 150–400)
RBC: 3.82 MIL/uL — ABNORMAL LOW (ref 3.87–5.11)
RDW: 12.7 % (ref 11.5–15.5)
WBC: 14.2 10*3/uL — ABNORMAL HIGH (ref 4.0–10.5)

## 2011-09-07 LAB — CARDIAC PANEL(CRET KIN+CKTOT+MB+TROPI)
Relative Index: 2.6 — ABNORMAL HIGH (ref 0.0–2.5)
Total CK: 164 U/L (ref 7–177)
Troponin I: <0.3 ng/mL (ref <0.30)

## 2011-09-07 LAB — PROTIME-INR: INR: 1.09 (ref 0.00–1.49)

## 2011-09-07 SURGERY — EGD (ESOPHAGOGASTRODUODENOSCOPY)
Anesthesia: Moderate Sedation

## 2011-09-07 MED ORDER — HYDROMORPHONE HCL PF 1 MG/ML IJ SOLN
1.0000 mg | Freq: Once | INTRAMUSCULAR | Status: AC
  Administered 2011-09-07: 1 mg via INTRAMUSCULAR
  Filled 2011-09-07: qty 1

## 2011-09-07 MED ORDER — BIOTENE DRY MOUTH MT LIQD
15.0000 mL | Freq: Two times a day (BID) | OROMUCOSAL | Status: DC
  Administered 2011-09-07 – 2011-09-08 (×4): 15 mL via OROMUCOSAL

## 2011-09-07 MED ORDER — MEPERIDINE HCL 100 MG/ML IJ SOLN
INTRAMUSCULAR | Status: DC | PRN
  Administered 2011-09-07: 25 mg via INTRAVENOUS
  Administered 2011-09-07: 50 mg via INTRAVENOUS

## 2011-09-07 MED ORDER — SODIUM CHLORIDE 0.45 % IV SOLN
Freq: Once | INTRAVENOUS | Status: AC
  Administered 2011-09-07: 10:00:00 via INTRAVENOUS

## 2011-09-07 MED ORDER — DIPHENHYDRAMINE HCL 50 MG/ML IJ SOLN
INTRAMUSCULAR | Status: AC
  Administered 2011-09-07: 12.5 mg
  Filled 2011-09-07: qty 1

## 2011-09-07 MED ORDER — MIDAZOLAM HCL 5 MG/5ML IJ SOLN
INTRAMUSCULAR | Status: DC | PRN
  Administered 2011-09-07 (×2): 2 mg via INTRAVENOUS

## 2011-09-07 MED ORDER — TRAMADOL HCL 50 MG PO TABS
50.0000 mg | ORAL_TABLET | Freq: Three times a day (TID) | ORAL | Status: DC | PRN
  Filled 2011-09-07 (×2): qty 1

## 2011-09-07 MED ORDER — SIMVASTATIN 20 MG PO TABS: 40.0000 mg | ORAL_TABLET | ORAL | Status: DC

## 2011-09-07 MED ORDER — PANTOPRAZOLE SODIUM 40 MG IV SOLR
40.0000 mg | Freq: Every day | INTRAVENOUS | Status: DC
  Administered 2011-09-07: 40 mg via INTRAVENOUS
  Filled 2011-09-07: qty 40

## 2011-09-07 MED ORDER — FAMOTIDINE IN NACL 20-0.9 MG/50ML-% IV SOLN
INTRAVENOUS | Status: AC
  Administered 2011-09-07: 20 mg
  Filled 2011-09-07: qty 50

## 2011-09-07 MED ORDER — TRAZODONE HCL 50 MG PO TABS
25.0000 mg | ORAL_TABLET | Freq: Every evening | ORAL | Status: DC | PRN
  Administered 2011-09-07: 25 mg via ORAL
  Filled 2011-09-07: qty 1

## 2011-09-07 MED ORDER — AMLODIPINE BESYLATE 5 MG PO TABS
10.0000 mg | ORAL_TABLET | Freq: Every day | ORAL | Status: DC
  Administered 2011-09-07 – 2011-09-08 (×2): 10 mg via ORAL
  Filled 2011-09-07 (×2): qty 2

## 2011-09-07 MED ORDER — MEPERIDINE HCL 100 MG/ML IJ SOLN
INTRAMUSCULAR | Status: AC
  Filled 2011-09-07: qty 1

## 2011-09-07 MED ORDER — METOPROLOL TARTRATE 50 MG PO TABS
50.0000 mg | ORAL_TABLET | Freq: Two times a day (BID) | ORAL | Status: DC
  Administered 2011-09-07 – 2011-09-08 (×4): 50 mg via ORAL
  Filled 2011-09-07 (×4): qty 1

## 2011-09-07 MED ORDER — CIPROFLOXACIN IN D5W 400 MG/200ML IV SOLN
400.0000 mg | Freq: Once | INTRAVENOUS | Status: DC
  Filled 2011-09-07: qty 200

## 2011-09-07 MED ORDER — ONDANSETRON HCL 4 MG/2ML IJ SOLN
4.0000 mg | Freq: Three times a day (TID) | INTRAMUSCULAR | Status: DC
  Administered 2011-09-07 – 2011-09-09 (×8): 4 mg via INTRAVENOUS
  Filled 2011-09-07 (×5): qty 2

## 2011-09-07 MED ORDER — ENOXAPARIN SODIUM 40 MG/0.4ML ~~LOC~~ SOLN: 40.0000 mg | Freq: Every day | SUBCUTANEOUS | Status: DC

## 2011-09-07 MED ORDER — METRONIDAZOLE IN NACL 5-0.79 MG/ML-% IV SOLN
500.0000 mg | Freq: Three times a day (TID) | INTRAVENOUS | Status: DC
Start: 2011-09-07 — End: 2011-09-09
  Administered 2011-09-07 – 2011-09-09 (×6): 500 mg via INTRAVENOUS
  Filled 2011-09-07 (×7): qty 100

## 2011-09-07 MED ORDER — BUTAMBEN-TETRACAINE-BENZOCAINE 2-2-14 % EX AERO
INHALATION_SPRAY | CUTANEOUS | Status: DC | PRN
  Administered 2011-09-07: 1 via TOPICAL

## 2011-09-07 MED ORDER — ROSUVASTATIN CALCIUM 20 MG PO TABS
10.0000 mg | ORAL_TABLET | Freq: Every day | ORAL | Status: DC
  Administered 2011-09-07 – 2011-09-08 (×2): 10 mg via ORAL
  Filled 2011-09-07 (×2): qty 1

## 2011-09-07 MED ORDER — METHYLPREDNISOLONE SODIUM SUCC 125 MG IJ SOLR
60.0000 mg | INTRAMUSCULAR | Status: AC
  Administered 2011-09-07: 60 mg via INTRAVENOUS
  Filled 2011-09-07: qty 2

## 2011-09-07 MED ORDER — DIPHENHYDRAMINE HCL 50 MG/ML IJ SOLN
12.5000 mg | Freq: Once | INTRAMUSCULAR | Status: AC
  Administered 2011-09-07: 12.5 mg via INTRAVENOUS
  Filled 2011-09-07: qty 1

## 2011-09-07 MED ORDER — DIPHENHYDRAMINE HCL 25 MG PO CAPS: 50.0000 mg | ORAL_CAPSULE | Freq: Four times a day (QID) | ORAL | Status: DC | PRN

## 2011-09-07 MED ORDER — NICOTINE 14 MG/24HR TD PT24
14.0000 mg | MEDICATED_PATCH | Freq: Every day | TRANSDERMAL | Status: DC
  Administered 2011-09-07 – 2011-09-08 (×2): 14 mg via TRANSDERMAL
  Filled 2011-09-07 (×2): qty 1

## 2011-09-07 MED ORDER — SIMETHICONE 40 MG/0.6ML PO SUSP
ORAL | Status: DC | PRN
  Administered 2011-09-07: 10:00:00

## 2011-09-07 MED ORDER — CLONIDINE HCL 0.2 MG PO TABS
0.3000 mg | ORAL_TABLET | Freq: Two times a day (BID) | ORAL | Status: DC
  Administered 2011-09-07 – 2011-09-08 (×4): 0.3 mg via ORAL
  Filled 2011-09-07: qty 3
  Filled 2011-09-07 (×3): qty 1

## 2011-09-07 MED ORDER — ASPIRIN EC 81 MG PO TBEC
81.0000 mg | DELAYED_RELEASE_TABLET | Freq: Every day | ORAL | Status: DC
  Administered 2011-09-07 – 2011-09-08 (×2): 81 mg via ORAL
  Filled 2011-09-07 (×2): qty 1

## 2011-09-07 MED ORDER — POTASSIUM CHLORIDE IN NACL 20-0.9 MEQ/L-% IV SOLN
INTRAVENOUS | Status: DC
  Administered 2011-09-07 – 2011-09-09 (×4): via INTRAVENOUS

## 2011-09-07 MED ORDER — CIPROFLOXACIN IN D5W 400 MG/200ML IV SOLN
400.0000 mg | Freq: Two times a day (BID) | INTRAVENOUS | Status: DC
  Administered 2011-09-07 – 2011-09-09 (×4): 400 mg via INTRAVENOUS
  Filled 2011-09-07 (×5): qty 200

## 2011-09-07 MED ORDER — MIDAZOLAM HCL 5 MG/5ML IJ SOLN
INTRAMUSCULAR | Status: AC
  Filled 2011-09-07: qty 10

## 2011-09-07 MED ORDER — HYDROMORPHONE HCL PF 1 MG/ML IJ SOLN
0.5000 mg | Freq: Once | INTRAMUSCULAR | Status: AC
  Administered 2011-09-07: 0.5 mg via INTRAVENOUS
  Filled 2011-09-07: qty 1

## 2011-09-07 MED ORDER — PANTOPRAZOLE SODIUM 40 MG PO TBEC
40.0000 mg | DELAYED_RELEASE_TABLET | Freq: Two times a day (BID) | ORAL | Status: DC
  Administered 2011-09-07 – 2011-09-09 (×4): 40 mg via ORAL
  Filled 2011-09-07 (×6): qty 1

## 2011-09-07 MED ORDER — DIPHENHYDRAMINE HCL 50 MG/ML IJ SOLN: 25.0000 mg | Freq: Four times a day (QID) | INTRAMUSCULAR | Status: DC | PRN

## 2011-09-07 MED ORDER — FAMOTIDINE 20 MG PO TABS
20.0000 mg | ORAL_TABLET | Freq: Two times a day (BID) | ORAL | Status: DC
  Administered 2011-09-07: 20 mg via ORAL
  Filled 2011-09-07: qty 1

## 2011-09-07 MED ORDER — ONDANSETRON HCL 4 MG/2ML IJ SOLN
4.0000 mg | INTRAMUSCULAR | Status: DC | PRN
  Administered 2011-09-07 – 2011-09-08 (×2): 4 mg via INTRAVENOUS
  Filled 2011-09-07 (×6): qty 2

## 2011-09-07 MED ORDER — PNEUMOCOCCAL VAC POLYVALENT 25 MCG/0.5ML IJ INJ
0.5000 mL | INJECTION | INTRAMUSCULAR | Status: AC
  Administered 2011-09-08: 0.5 mL via INTRAMUSCULAR
  Filled 2011-09-07: qty 0.5

## 2011-09-07 MED ORDER — ONDANSETRON HCL 4 MG PO TABS: 4.0000 mg | ORAL_TABLET | ORAL | Status: DC | PRN

## 2011-09-07 MED ORDER — FAMOTIDINE IN NACL 20-0.9 MG/50ML-% IV SOLN
20.0000 mg | Freq: Two times a day (BID) | INTRAVENOUS | Status: DC
  Filled 2011-09-07: qty 50

## 2011-09-07 MED ORDER — ACETAMINOPHEN 325 MG PO TABS
650.0000 mg | ORAL_TABLET | ORAL | Status: DC | PRN
  Administered 2011-09-07 (×2): 650 mg via ORAL
  Filled 2011-09-07 (×2): qty 2

## 2011-09-07 MED ORDER — SODIUM CHLORIDE 0.9 % IJ SOLN
INTRAMUSCULAR | Status: AC
  Administered 2011-09-07: 3 mL
  Filled 2011-09-07: qty 3

## 2011-09-07 MED ORDER — METRONIDAZOLE IN NACL 5-0.79 MG/ML-% IV SOLN
500.0000 mg | Freq: Once | INTRAVENOUS | Status: DC
  Filled 2011-09-07: qty 100

## 2011-09-07 NOTE — Progress Notes (Signed)
UR Chart Review Completed  

## 2011-09-07 NOTE — Progress Notes (Signed)
[name], [name]             ACCOUNT NO.:  [id number]  MEDICAL RECORD NO.:  [id number]  LOCATION:  A317                          FACILITY:  [facility]  PHYSICIAN:  Kalayah Leske D. Felecia Shelling, MD   DATE OF BIRTH:  Dec 01, 1950  DATE OF PROCEDURE:  09/08/2011 DATE OF DISCHARGE:                                PROGRESS NOTE   SUBJECTIVE:  The complains nausea, vomiting and abdominal pain.  She has no fever or chills.  OBJECTIVE:  GENERAL:  The patient is alert, awake, sick looking. VITAL SIGNS:  Blood pressure 139/68, pulse 71, respiratory rate 16, temperature 98.3 degrees Fahrenheit. CHEST:  Clear.  Lung field good air entry. CARDIOVASCULAR:  First and second heart sound heard.  No murmur.  No gallop.  ABDOMEN:  Soft and lax.  Bowel sound is positive.  There is mild right lower quadrant tenderness. EXTREMITIES:  No leg edema.  CT scan of the abdomen is showing mild inflammatory changes in the proximal sigmoid colon, possibly reflecting colitis.  CBC; WBC 14.2, hemoglobin 12.3, hematocrit 37.7 and platelet 275.  ASSESSMENT: 1. Abdominal pain, nausea, vomiting, probably secondary to colitis. 2. Hypertension. 3. Hyperglycemia. 4. History of coronary artery disease.  PLAN:  We will continue the patient on IV antibiotics.  We will gradually rehydrate.  We will continue her regular medications.  We will do GI consult.     Lila Lufkin D. Felecia Shelling, MD     TDF/MEDQ  D:  09/07/2011  T:  09/07/2011  Job:  [id number]

## 2011-09-07 NOTE — Interval H&P Note (Signed)
History and Physical Interval Note:  09/07/2011 10:18 AM  [name] [name]  has presented today for surgery, with the diagnosis of Nausea and vomiting, GERD  The various methods of treatment have been discussed with the patient and family. After consideration of risks, benefits and other options for treatment, the patient has consented to  Procedure(s): ESOPHAGOGASTRODUODENOSCOPY (EGD) as a surgical intervention .  The patients' history has been reviewed, patient examined, no change in status, stable for surgery.  I have reviewed the patients' chart and labs.  Questions were answered to the patient's satisfaction.     [employer]

## 2011-09-07 NOTE — Op Note (Signed)
[facility] [address] [place], [place]  [address]  ENDOSCOPY PROCEDURE REPORT  PATIENT:  April, [name]  MR#:  [id number] BIRTHDATE:  [birth year], 60 yrs. old  GENDER:  female  ENDOSCOPIST:  Jonette Eva, MD Referred by:  Glenice Laine, M.D.  PROCEDURE DATE:  09/07/2011 PROCEDURE:  EGD with biopsy, [id number] ASA CLASS: INDICATIONS:  nausea and vomiting-abdominal pain(lower)-ct w/ colitis-USES ASA, NO DYSPHAGIA  MEDICATIONS:   Demerol 75 mg IV, Versed 4 mg IV TOPICAL ANESTHETIC:  Cetacaine Spray  DESCRIPTION OF PROCEDURE:     Physical exam was performed. Informed consent was obtained from the patient after explaining the benefits, risks, and alternatives to the procedure.  The patient was connected to the monitor and placed in the left lateral position.  Continuous oxygen was provided by nasal cannula and IV medicine administered through an indwelling cannula.  After administration of sedation, the patient's esophagus was intubated and the EG-[id number] ([id number]) endoscope was advanced under direct visualization to the second portion of the duodenum.  The scope was removed slowly by carefully examining the color, texture, anatomy, and integrity of the mucosa on the way out.  The patient was recovered in endoscopy and discharged home in satisfactory condition. <<PROCEDUREIMAGES>>  An PATENT esophageal ring was found in the distal esophagus.  Mild gastritis was found & BIOPSIED VIA COD FORCEPS.  COMPLICATIONS:    None  ENDOSCOPIC IMPRESSION: 1) Ring, esophageal in the distal esophagus 2) Mild gastritis  RECOMMENDATIONS: bid ppi await biopsies full liquid zofran atc TCS as an OP  REPEAT EXAM:  No  ______________________________ Jonette Eva, MD  CC:  n. eSIGNED:   Nimesh Riolo at 09/07/2011 10:54 AM  Orland Mustard, [phone]

## 2011-09-07 NOTE — Progress Notes (Signed)
Per report, pt started to have some itching all over her body after receiving dilaudid and morphine for pain,  Night nurse was told to not give any antibiotics until the itching subsided.  Pt has had no complaints of itching this AM  -antibiotics given as scheduled

## 2011-09-07 NOTE — Progress Notes (Signed)
CARE MANAGEMENT NOTE 09/07/2011  Patient:  [name], [name]   Account Number:  [id number]  Date Initiated:  09/07/2011  Documentation initiated by:  Rosemary Holms  Subjective/Objective Assessment:   Pt was admitted with abdominal pain. PTA lived alone but has sons who assist her     Action/Plan:   Pt does not anticipate DC HH needs. CM to follow.   Anticipated DC Date:  09/09/2011   Anticipated DC Plan:  HOME/SELF CARE         Choice offered to / List presented to:             Status of service:  In process, will continue to follow Medicare Important Message given?   (If response is "NO", the following Medicare IM given date fields will be blank) Date Medicare IM given:   Date Additional Medicare IM given:    Discharge Disposition:    Per UR Regulation:    Comments:  09/07/11 1500 Eilee Schader Robosn RN BSN CM

## 2011-09-07 NOTE — H&P (Signed)
PCP:  Dr. Felecia Shelling  Chief Complaint:  Vomiting and abdominal pain for 2 days  HPI:  The history is compromised because patient is distracted by severe pruritus caused by medication she's received in the emergency room but probably morphine and Dilaudid  [name] is an 60 y.o. African American female.   Hypertension, coronary artery disease; reports good baseline state of health, until 2 days ago when she developed cramping abdominal pain and persistent vomiting whenever she tried to eat or drink anything.she reports that she's been persistently thirsty because of her inability to keep anything down and eventually came to the emergency room to be evaluated. Patient also reports fevers and chills and sweating  , but no cough no colds no chest pain. He has had no blood in the vomit . She is having no diarrhea and in fact tends to be constipated .   The patient incidentally reports that she's had swelling of the left leg for the past 2 months.  In the emergency room a CT scan of the abdomen was done which revealed evidence of a mild colitis. Her contrast allergy no intravenous dye was used .  Rewiew of Systems:  The patient denies anorexia,, weight loss,, vision loss, decreased hearing, hoarseness, chest pain, syncope, dyspnea on exertion, peripheral edema, balance deficits, hemoptysis, abdominal pain, melena, hematochezia, severe indigestion/heartburn, hematuria, incontinence, genital sores, muscle weakness, suspicious skin lesions, transient blindness, difficulty walking, depression, unusual weight change, abnormal bleeding, enlarged lymph nodes, angioedema, and breast masses.   Past Medical History  Diagnosis Date  . Myocardial infarct       1997, 2004   . Hypertension   . Hyperlipidemia   . CVA (cerebral infarction)       . Severe major depression with psychotic features     2004    History reviewed. No pertinent past surgical history.  Medications:  HOME MEDS: Prior to  Admission medications   Medication Sig Start Date End Date Taking? Authorizing Provider  acetaminophen (TYLENOL) 325 MG tablet Take 650 mg by mouth every 6 (six) hours as needed.     Yes Historical Provider, MD  amLODipine (NORVASC) 10 MG tablet Take 10 mg by mouth daily.     Yes Historical Provider, MD  aspirin 81 MG tablet Take 81 mg by mouth daily.     Yes Historical Provider, MD  cloNIDine (CATAPRES) 0.3 MG tablet Take 0.3 mg by mouth 2 (two) times daily.     Yes Historical Provider, MD  metoprolol (LOPRESSOR) 50 MG tablet Take 50 mg by mouth 2 (two) times daily.     Yes Historical Provider, MD  polyethylene glycol (MIRALAX / GLYCOLAX) packet Take 17 g by mouth as needed.     Yes Historical Provider, MD  senna-docusate (SENOKOT-S) 8.6-50 MG per tablet Take 1 tablet by mouth daily.     Yes Historical Provider, MD  simvastatin (ZOCOR) 40 MG tablet Take 40 mg by mouth 1 day or 1 dose.     Yes Historical Provider, MD     Allergies:  Allergies  Allergen Reactions  . Penicillins    morphine, iodine contrast dye  Social History:   reports that she has been smoking.  She does not have any smokeless tobacco history on file. She reports that she does not drink alcohol or use illicit drugs.  Has smoked half total one pack of cigarettes per day since age 27  Family History: Family History  Problem Relation Age of Onset  . Multiple  sclerosis Father    unaware of any other family medical problems   Physical Exam: Filed Vitals:   09/06/11 2211 09/07/11 0059 09/07/11 0215 09/07/11 0355  BP: 160/93 161/60  139/68  Pulse: 99 74 71 92  Temp: 98.3 F (36.8 C)     TempSrc: Oral     Resp:  18    Height: 5' (1.524 m)     Weight: 58.968 kg (130 lb)     SpO2: 97% 95% 98% 99%   Blood pressure 139/68, pulse 92, temperature 98.3 F (36.8 C), temperature source Oral, resp. rate 18, height 5' (1.524 m), weight 58.968 kg (130 lb), SpO2 99.00%.  GEN:  Pleasant middle-aged African American lady  lying in the stretcher distressed by his severe pruritus; cooperative with exam PSYCH:  alert and oriented x4;  affect is surprisingly flat HEENT: Mucous membranes pink dry and anicteric; PERRLA; EOM intact; no cervical lymphadenopathy nor thyromegaly or carotid bruit; no JVD; Breasts:: Not examined CHEST WALL: No tenderness CHEST: Normal respiration, clear to auscultation bilaterally HEART: Regular rate and rhythm; no murmurs rubs or gallops BACK: No kyphosis or scoliosis; no CVA tenderness ABDOMEN: Obese, soft non-tender; no masses, no organomegaly, normal abdominal bowel sounds;  Rectal Exam: Not done EXTREMITIES:  age-appropriate arthropathy of the hands and knees; no edema; no ulcerations. Left leg slightly larger than right; no edema; no excessively dilated vein Genitalia: not examined PULSES: 2+ and symmetric SKIN: Normal hydration; blotchy erythema of the back and flank since relieving receiving intravenous Dilaudid; no CNS: Cranial nerves 2-12 grossly intact no focal neurologic deficit   Labs & Imaging Results for orders placed during the hospital encounter of 09/06/11 (from the past 48 hour(s))  COMPREHENSIVE METABOLIC PANEL     Status: Abnormal   Collection Time   09/06/11 10:54 PM      Component Value Range Comment   Sodium 136  135 - 145 (mEq/L)    Potassium 3.7  3.5 - 5.1 (mEq/L)    Chloride 97  96 - 112 (mEq/L)    CO2 27  19 - 32 (mEq/L)    Glucose, Bld 160 (*) 70 - 99 (mg/dL)    BUN 9  6 - 23 (mg/dL)    Creatinine, Ser 4.40  0.50 - 1.10 (mg/dL)    Calcium 10.2  8.4 - 10.5 (mg/dL)    Total Protein 9.2 (*) 6.0 - 8.3 (g/dL)    Albumin 4.4  3.5 - 5.2 (g/dL)    AST 24  0 - 37 (U/L)    ALT 23  0 - 35 (U/L)    Alkaline Phosphatase 149 (*) 39 - 117 (U/L)    Total Bilirubin 0.7  0.3 - 1.2 (mg/dL)    GFR calc non Af Amer >90  >90 (mL/min)    GFR calc Af Amer >90  >90 (mL/min)   AMYLASE     Status: Normal   Collection Time   09/06/11 10:54 PM      Component Value Range  Comment   Amylase 68  0 - 105 (U/L)   LIPASE, BLOOD     Status: Normal   Collection Time   09/06/11 10:54 PM      Component Value Range Comment   Lipase 23  11 - 59 (U/L)   CBC     Status: Abnormal   Collection Time   09/06/11 10:54 PM      Component Value Range Comment   WBC 16.5 (*) 4.0 - 10.5 (K/uL)  RBC 4.39  3.87 - 5.11 (MIL/uL)    Hemoglobin 14.7  12.0 - 15.0 (g/dL)    HCT 40.9  81.1 - 91.4 (%)    MCV 97.9  78.0 - 100.0 (fL)    MCH 33.5  26.0 - 34.0 (pg)    MCHC 34.2  30.0 - 36.0 (g/dL)    RDW 78.2  95.6 - 21.3 (%)    Platelets 317  150 - 400 (K/uL)   DIFFERENTIAL     Status: Abnormal   Collection Time   09/06/11 10:54 PM      Component Value Range Comment   Neutrophils Relative 81 (*) 43 - 77 (%)    Neutro Abs 13.4 (*) 1.7 - 7.7 (K/uL)    Lymphocytes Relative 14  12 - 46 (%)    Lymphs Abs 2.3  0.7 - 4.0 (K/uL)    Monocytes Relative 4  3 - 12 (%)    Monocytes Absolute 0.7  0.1 - 1.0 (K/uL)    Eosinophils Relative 0  0 - 5 (%)    Eosinophils Absolute 0.0  0.0 - 0.7 (K/uL)    Basophils Relative 0  0 - 1 (%)    Basophils Absolute 0.0  0.0 - 0.1 (K/uL)   URINALYSIS, ROUTINE W REFLEX MICROSCOPIC     Status: Abnormal   Collection Time   09/06/11 11:24 PM      Component Value Range Comment   Color, Urine YELLOW  YELLOW     APPearance CLEAR  CLEAR     Specific Gravity, Urine >1.030 (*) 1.005 - 1.030     pH 6.0  5.0 - 8.0     Glucose, UA NEGATIVE  NEGATIVE (mg/dL)    Hgb urine dipstick SMALL (*) NEGATIVE     Bilirubin Urine NEGATIVE  NEGATIVE     Ketones, ur 40 (*) NEGATIVE (mg/dL)    Protein, ur 086 (*) NEGATIVE (mg/dL)    Urobilinogen, UA 0.2  0.0 - 1.0 (mg/dL)    Nitrite NEGATIVE  NEGATIVE     Leukocytes, UA NEGATIVE  NEGATIVE    URINE MICROSCOPIC-ADD ON     Status: Abnormal   Collection Time   09/06/11 11:24 PM      Component Value Range Comment   Squamous Epithelial / LPF MANY (*) RARE     WBC, UA 3-6  <3 (WBC/hpf)    RBC / HPF 3-6  <3 (RBC/hpf)     Bacteria, UA FEW (*) RARE     Urine-Other MUCOUS PRESENT      Ct Abdomen Pelvis Wo Contrast  09/07/2011  *RADIOLOGY REPORT*  Clinical Data: Abdominal pain, nausea/vomiting  CT ABDOMEN AND PELVIS WITHOUT CONTRAST  Technique:  Multidetector CT imaging of the abdomen and pelvis was performed following the standard protocol without intravenous contrast.  Comparison: 09/22/2008  Findings: Lung bases are essentially clear.  Unenhanced liver, spleen, pancreas, and adrenal glands within normal limits.  Gallbladder is unremarkable.  No intrahepatic or extrahepatic ductal dilatation.  4.2 x 4.1 cm left upper pole renal cyst.  Right kidney is unremarkable.  No hydronephrosis.  No evidence of bowel obstruction.  Mild inflammatory stranding surrounding a loop of proximal sigmoid colon (series 2/image 46), possibly reflecting a mild colitis.  Atherosclerotic calcifications of the abdominal aorta and branch vessels.  No abdominopelvic ascites.  No suspicious abdominopelvic lymphadenopathy.  Status post hysterectomy.  No adnexal masses.  No ureteral or bladder calculi.  Bladder is low-lying.  Mild degenerative changes of the visualized thoracolumbar spine.  IMPRESSION: Mild inflammatory stranding surrounding a loop of proximal sigmoid colon, possibly reflecting a mild colitis.  No evidence of bowel obstruction.  No renal, ureteral, or bladder calculi.  Original Report Authenticated By: Charline Bills, M.D.      Assessment Present on Admission:  .Persistent vomiting .Colitis .Dehydration .Hyperproteinemia  .Allergic reaction caused by a drug  .HYPERLIPIDEMIA-MIXED .CAD (coronary artery disease), native coronary artery .HYPERTENSION, UNSPECIFIED    PLAN: We'll admit this lady for hydration, and antiemetics. Is possible that colitis is the fundamental cause of her persistent vomiting, she may also be having a gastritis or other upper GI problem. Will add a Protonix to her medical regimen. Will give  intravenous Cipro and Flagyl for colitis, we'll place her on a clear liquid diet, and will consider a GI consult if she does not settled quickly.  She has not responded to intravenous Benadryl and Pepcid given in the emergency room for allergy, will give one dose of intravenous Solu-Medrol, and continue with Pepcid and Benadryl as necessary.  She is markedly hyperproteinemia, and this may be dated to her severe dehydration, may also be dated to an underlying problem such as myeloma; we will repeat her bleed metabolic panel in the morning after hydration.  Continue her chronic medications as she is able to tolerate.  Her primary care physician Dr. Felecia Shelling will take over her care in the morning  Other plans as per orders.   [name] 09/07/2011, 4:02 AM

## 2011-09-07 NOTE — H&P (Signed)
Arlyce Harman, MD filed at 09/07/11 1017      Original Note by: Lorenza Burton, NP filed at 09/07/11 [phone]       Referring Provider: Dr. Orvan Falconer ([facility]) Primary Care Physician:  Dr. Felecia Shelling Primary Gastroenterologist:  Dr. Darrick Penna   Reason for Consultation:  Abd pain/Nausea & vomiting   HPI: [name] is a 60 y.o. female w/ 10 day hx of abdominal pain, nausea & vomiting.  She tells me her symptoms are worse over the last 2 days. Vomiting all day every times she eats.  Last emesis this AM.  C/o lower abd pain "sharp" contractions that constant 10/10.  Never had anything like this before.  N.p.o. except for a few sips of ginger ale for the last 2 days. C/o constipation.  BM just "a bit" every q several days.  Last BM this am "big balls".  Denies rectal bleeding or melena.  Takes miralax 17 grams daily.  C/o heartburn & indigestion.  Took herself off omeprazole 20mg  daily about 1 year ago.  Denies dysphasia or odynophagia. C/o left arm pain last week & has been taking Tylenol Arthritis.  Takes baby asa daily but denies other NSAIDs.  C/o cold chills & sweats, but did not take temperature.  Appetite been poor for a month.  Wt loss 5-7 # in past yr.  No ill contacts.      Past Medical History   Diagnosis  Date   .  Myocardial infarct         1997, 2004   .  Hypertension     .  Hyperlipidemia     .  CVA (cerebral infarction)     .  Severe major depression with psychotic features         2004   .  Anxiety     .  Blind         right         Past Surgical History   Procedure  Date   .  Abdominal hysterectomy     .  Lumps removed from each breast     .  Bilateral salpingoophorectomy     .  Coronary angioplasty with stent placement  [id number]   .  Esophagogastroduodenoscopy  09/05/2004       Dr Jimmye Norman gastritis, candida esophagitis         Prior to Admission medications    Medication  Sig  Start Date  End Date  Taking?  Authorizing Provider   acetaminophen  (TYLENOL) 325 MG tablet  Take 650 mg by mouth every 6 (six) hours as needed.        Yes  Historical Provider, MD   amLODipine (NORVASC) 10 MG tablet  Take 10 mg by mouth daily.        Yes  Historical Provider, MD   aspirin 81 MG tablet  Take 81 mg by mouth daily.        Yes  Historical Provider, MD   cloNIDine (CATAPRES) 0.3 MG tablet  Take 0.3 mg by mouth 2 (two) times daily.        Yes  Historical Provider, MD   metoprolol (LOPRESSOR) 50 MG tablet  Take 50 mg by mouth 2 (two) times daily.        Yes  Historical Provider, MD   polyethylene glycol (MIRALAX / GLYCOLAX) packet  Take 17 g by mouth as needed.        Yes  Historical Provider, MD  senna-docusate (SENOKOT-S) 8.6-50 MG per tablet  Take 1 tablet by mouth daily.        Yes  Historical Provider, MD   simvastatin (ZOCOR) 40 MG tablet  Take 40 mg by mouth 1 day or 1 dose.        Yes  Historical Provider, MD         Current Facility-Administered Medications   Medication  Dose  Route  Frequency  Provider  Last Rate  Last Dose   .  0.9 % NaCl with KCl 20 mEq/ L  infusion     Intravenous  Continuous  Tesfaye [name]  125 mL/hr at 09/07/11 0416      .  acetaminophen (TYLENOL) tablet 650 mg   650 mg  Oral  Q4H PRN  Vania Rea         .  amLODipine (NORVASC) tablet 10 mg   10 mg  Oral  Daily  Vania Rea         .  antiseptic oral rinse (BIOTENE) solution 15 mL   15 mL  Mouth Rinse  BID  Vania Rea         .  aspirin EC tablet 81 mg   81 mg  Oral  Daily  Vania Rea         .  ciprofloxacin (CIPRO) IVPB 400 mg   400 mg  Intravenous  Once  Jola Baptist Idol, PA         .  ciprofloxacin (CIPRO) IVPB 400 mg   400 mg  Intravenous  Q12H  Vania Rea         .  cloNIDine (CATAPRES) tablet 0.3 mg   0.3 mg  Oral  BID  Vania Rea         .  diphenhydrAMINE (BENADRYL) 50 MG/ML injection              12.5 mg at 09/07/11 0242   .  diphenhydrAMINE (BENADRYL) capsule 50 mg   50 mg  Oral  Q6H PRN  Vania Rea           Or   .   diphenhydrAMINE (BENADRYL) injection 25 mg   25 mg  Intravenous  Q6H PRN  Vania Rea         .  diphenhydrAMINE (BENADRYL) injection 12.5 mg   12.5 mg  Intravenous  Once  [facility]     12.5 mg at 09/07/11 0238   .  enoxaparin (LOVENOX) injection 40 mg   40 mg  Subcutaneous  Daily  Vania Rea         .  famotidine (PEPCID) 20-0.9 MG/50ML-% IVPB              20 mg at 09/07/11 0246   .  famotidine (PEPCID) tablet 20 mg   20 mg  Oral  Q12H  Vania Rea           Or   .  famotidine (PEPCID) IVPB 20 mg   20 mg  Intravenous  Q12H  Vania Rea         .  HYDROmorphone (DILAUDID) injection 0.5 mg   0.5 mg  Intravenous  Once  Candis Musa, PA     0.5 mg at 09/07/11 0042   .  HYDROmorphone (DILAUDID) injection 1 mg   1 mg  Intramuscular  Once  Candis Musa, PA     1 mg at 09/07/11 0207   .  methylPREDNISolone sodium succinate (SOLU-MEDROL)  125 MG injection 60 mg   60 mg  Intravenous  STAT  Leopold Campbell     60 mg at 09/07/11 0349   .  metoprolol (LOPRESSOR) tablet 50 mg   50 mg  Oral  BID  Vania Rea         .  metroNIDAZOLE (FLAGYL) IVPB 500 mg   500 mg  Intravenous  Once  Jola Baptist Idol, PA         .  metroNIDAZOLE (FLAGYL) IVPB 500 mg   500 mg  Intravenous  Q8H  Vania Rea         .  morphine 4 MG/ML injection 4 mg   4 mg  Intravenous  Once  Candis Musa, PA     4 mg at 09/07/11 0005   .  nicotine (NICODERM CQ - dosed in mg/24 hours) patch 14 mg   14 mg  Transdermal  Daily  Vania Rea     14 mg at 09/07/11 0518   .  ondansetron (ZOFRAN) injection 4 mg   4 mg  Intravenous  Once  Candis Musa, PA     4 mg at 09/07/11 0005   .  ondansetron (ZOFRAN) tablet 4 mg   4 mg  Oral  Q4H PRN  Vania Rea           Or   .  ondansetron [facility]) injection 4 mg   4 mg  Intravenous  Q4H PRN  Vania Rea         .  pantoprazole (PROTONIX) injection 40 mg   40 mg  Intravenous  QHS  Leopold Campbell     40 mg at 09/07/11 0430   .  pneumococcal 23 valent vaccine  (PNU-IMMUNE) injection 0.5 mL   0.5 mL  Intramuscular  Tomorrow-1000  Vania Rea         .  simvastatin (ZOCOR) tablet 40 mg   40 mg  Oral  Q24H  Vania Rea         .  traZODone (DESYREL) tablet 25 mg   25 mg  Oral  QHS PRN  Vania Rea             Allergies   Allergen  Reactions   .  Penicillins  Anaphylaxis   .  Contrast Media (Iodinated Diagnostic Agents)  Swelling   .  Dilaudid (Hydromorphone Hcl)  Itching       Family History:There is no known family history of colorectal carcinoma , liver disease, or inflammatory bowel disease.     Problem  Relation  Age of Onset   .  Multiple sclerosis  Father     .  Alzheimer's disease  Mother           History       Social History   .  Marital Status:  Widowed       Spouse Name:  N/A       Number of Children:  3   .  Years of Education:  N/A       Occupational History   .  disabled, Best boy         Social History Main Topics   .  Smoking status:  Current Everyday Smoker -- 0.2 packs/day for 40 years       Types:  Cigarettes   .  Smokeless tobacco:  Never Used   .  Alcohol Use:  No   .  Drug Use:  No   .  Sexually Active:  Not Currently       Birth Control/ Protection:  Post-menopausal       Other Topics  Concern   .  Not on file       Social History Narrative     Lives alone    Review of Systems: Gen: See history of present illness CV: Denies chest pain, angina, palpitations, syncope, orthopnea, PND, peripheral edema, and claudication. Resp: Denies dyspnea at rest, dyspnea with exercise, cough, sputum, wheezing, coughing up blood, and pleurisy. GI: Denies vomiting blood, jaundice, and fecal incontinence.    GU : Denies urinary burning, blood in urine, urinary frequency, urinary hesitancy, nocturnal urination, and urinary incontinence. MS: Denies joint pain, limitation of movement, and swelling, stiffness, low back pain, extremity pain. Denies muscle weakness, cramps, atrophy.   Derm: Complains of  pruritus status post medication yesterday. She was not given IV contrast for her CT given history of reaction.   Psych: Denies depression, anxiety, memory loss, suicidal ideation, hallucinations, paranoia, and confusion. Heme: Denies bruising, bleeding, and enlarged lymph nodes. Neuro:  Denies any headaches, dizziness, paresthesias. Endo:  Denies any problems with DM, thyroid, adrenal function.   Physical Exam: Vital signs in last 24 hours: Temp:  [98.3 F (36.8 C)] 98.3 F (36.8 C) (12/17 0411) Pulse Rate:  [71-101] 101  (12/17 0411) Resp:  [16-18] 16  (12/17 0411) BP: (139-161)/(60-93) 145/75 mmHg (12/17 0411) SpO2:  [93 %-99 %] 93 % (12/17 0411) Weight:  [130 lb (58.968 kg)-132 lb 7.9 oz (60.1 kg)] 132 lb 7.9 oz (60.1 kg) (12/17 0411) Last BM Date: 09/05/11 General:   Alert,  Well-developed, well-nourished, pleasant and cooperative in NAD. Head:  Normocephalic and atraumatic. Eyes:  Sclera clear, no icterus.   Conjunctiva pink. Ears:  Normal auditory acuity. Nose:  No deformity, discharge, or lesions. Mouth:  No deformity or lesions,oropharynx pink & moist. Neck:  Supple; no masses or thyromegaly. Lungs:  Clear throughout to auscultation.   No wheezes, crackles, or rhonchi. No acute distress. Heart:  Regular rate and rhythm; no murmurs, clicks, rubs,  or gallops. Abdomen:  Normal bowel sounds.  Left renal bruit.  Soft, non-distended.   Bilateral lower quadrant tenderness on palpation without masses, hepatosplenomegaly or hernias noted.  No guarding or rebound tenderness.    Rectal:  Deferred. Patient reports Hemoccult-negative in the emergency department. Msk:  Symmetrical withounormt gross deformities. Normal posture. Pulses:  Normal pulses noted. Extremities:  No clubbing or edema. Neurologic:  Alert and oriented x4;  grossly normal neurologically. Skin:  Intact without significant lesions or rashes. Lymph Nodes:  No significant cervical adenopathy. Psych:  Alert and  cooperative. Normal mood and affect.   Lab Results:   [facility]  09/07/11 0440  09/06/11 2254   WBC  14.2*  16.5*   HGB  12.3  14.7   HCT  37.7  43.0   PLT  275  317      BMET   Basename  09/07/11 0440  09/06/11 2254   NA  138  136   K  3.4*  3.7   CL  102  97   CO2  25  27   GLUCOSE  143*  160*   BUN  11  9   CREATININE  0.69  0.68   CALCIUM  9.5  10.5      LFT   Basename  09/07/11 0440  09/06/11 2254   PROT  7.8  9.2*   ALBUMIN  3.7  4.4  AST  21  24   ALT  19  23   ALKPHOS  126*  149*   BILITOT  0.6  0.7   BILIDIR  --  --   IBILI  --  --   LIPASE  --  23   AMYLASE  --  68      PT/INR   Basename  09/07/11 0440   LABPROT  14.3   INR  1.09    Studies/Results: Ct Abdomen Pelvis Wo Contrast   09/07/2011  *RADIOLOGY REPORT*  Clinical Data: Abdominal pain, nausea/vomiting  CT ABDOMEN AND PELVIS WITHOUT CONTRAST  Technique:  Multidetector CT imaging of the abdomen and pelvis was performed following the standard protocol without intravenous contrast.  Comparison: 09/22/2008  Findings: Lung bases are essentially clear.  Unenhanced liver, spleen, pancreas, and adrenal glands within normal limits.  Gallbladder is unremarkable.  No intrahepatic or extrahepatic ductal dilatation.  4.2 x 4.1 cm left upper pole renal cyst.  Right kidney is unremarkable.  No hydronephrosis.  No evidence of bowel obstruction.  Mild inflammatory stranding surrounding a loop of proximal sigmoid colon (series 2/image 46), possibly reflecting a mild colitis.  Atherosclerotic calcifications of the abdominal aorta and branch vessels.  No abdominopelvic ascites.  No suspicious abdominopelvic lymphadenopathy.  Status post hysterectomy.  No adnexal masses.  No ureteral or bladder calculi.  Bladder is low-lying.  Mild degenerative changes of the visualized thoracolumbar spine.  IMPRESSION: Mild inflammatory stranding surrounding a loop of proximal sigmoid colon, possibly reflecting a mild colitis.  No  evidence of bowel obstruction.  No renal, ureteral, or bladder calculi.  Original Report Authenticated By: Charline Bills, M.D.       Impression: [name] is a pleasant 60 y.o. female with ten-day history of abdominal pain, nausea, and vomiting, worse over the past 2 days. She has history of GERD however she's been off PPI for approximately a year. Noncontrast CT shows mild inflammatory stranding surrounding a loop of proximal sigmoid colon, possible mild colitis, however this is nonspecific given lack of IV contrast. She does have chronic leukocytosis dating back at least 4 years ago. Colitis is a possibility, however there is no history of diarrhea, and she has chronic constipation with MiraLax. She will need further evaluation to rule out peptic ulcer disease, H. pylori gastritis, or occult malignancy.   She has an isolated chronically elevated alkaline phosphatase which may require further workup.   She has never had colonoscopy.     Plan: Agree with empiric Cipro and Flagyl Agree with PPI EGD with Dr. Darrick Penna today Colonoscopy once nausea and vomiting resolves.    If she develops diarrhea, she will need a full set of stool studies hold Lovenox for EGD   We would like to thank you for the opportunity to participate in the care of [name].  LOS: 1 day    Lorenza Burton  09/07/2011, 8:45 AM [facility]

## 2011-09-07 NOTE — Consult Note (Signed)
Referring Provider: Dr. Orvan Falconer ([facility]) Primary Care Physician:  Dr. Felecia Shelling Primary Gastroenterologist:  Dr. Darrick Penna  Reason for Consultation:  Abd pain/Nausea & vomiting  HPI: [name] is a 60 y.o. female w/ 10 day hx of abdominal pain, nausea & vomiting.  She tells me her symptoms are worse over the last 2 days. Vomiting all day every times she eats.  Last emesis this AM.  C/o lower abd pain "sharp" contractions that constant 10/10.  Never had anything like this before.  N.p.o. except for a few sips of ginger ale for the last 2 days. C/o constipation.  BM just "a bit" every q several days.  Last BM this am "big balls".  Denies rectal bleeding or melena.  Takes miralax 17 grams daily.  C/o heartburn & indigestion.  Took herself off omeprazole 20mg  daily about 1 year ago.  Denies dysphasia or odynophagia. C/o left arm pain last week & has been taking Tylenol Arthritis.  Takes baby asa daily but denies other NSAIDs.  C/o cold chills & sweats, but did not take temperature.  Appetite been poor for a month.  Wt loss 5-7 # in past yr.  No ill contacts.    Past Medical History  Diagnosis Date  . Myocardial infarct     1997, 2004  . Hypertension   . Hyperlipidemia   . CVA (cerebral infarction)   . Severe major depression with psychotic features     2004  . Anxiety   . Blind     right    Past Surgical History  Procedure Date  . Abdominal hysterectomy   . Lumps removed from each breast   . Bilateral salpingoophorectomy   . Coronary angioplasty with stent placement 5784,6962  . Esophagogastroduodenoscopy 09/05/2004    Dr Jimmye Norman gastritis, candida esophagitis    Prior to Admission medications   Medication Sig Start Date End Date Taking? Authorizing Provider  acetaminophen (TYLENOL) 325 MG tablet Take 650 mg by mouth every 6 (six) hours as needed.     Yes Historical Provider, MD  amLODipine (NORVASC) 10 MG tablet Take 10 mg by mouth daily.     Yes Historical Provider,  MD  aspirin 81 MG tablet Take 81 mg by mouth daily.     Yes Historical Provider, MD  cloNIDine (CATAPRES) 0.3 MG tablet Take 0.3 mg by mouth 2 (two) times daily.     Yes Historical Provider, MD  metoprolol (LOPRESSOR) 50 MG tablet Take 50 mg by mouth 2 (two) times daily.     Yes Historical Provider, MD  polyethylene glycol (MIRALAX / GLYCOLAX) packet Take 17 g by mouth as needed.     Yes Historical Provider, MD  senna-docusate (SENOKOT-S) 8.6-50 MG per tablet Take 1 tablet by mouth daily.     Yes Historical Provider, MD  simvastatin (ZOCOR) 40 MG tablet Take 40 mg by mouth 1 day or 1 dose.     Yes Historical Provider, MD    Current Facility-Administered Medications  Medication Dose Route Frequency Provider Last Rate Last Dose  . 0.9 % NaCl with KCl 20 mEq/ L  infusion   Intravenous Continuous Tesfaye [name] 125 mL/hr at 09/07/11 0416    . acetaminophen (TYLENOL) tablet 650 mg  650 mg Oral Q4H PRN Vania Rea      . amLODipine (NORVASC) tablet 10 mg  10 mg Oral Daily Vania Rea      . antiseptic oral rinse (BIOTENE) solution 15 mL  15 mL Mouth Rinse BID Thayer Ohm  Orvan Falconer      . aspirin EC tablet 81 mg  81 mg Oral Daily Vania Rea      . ciprofloxacin (CIPRO) IVPB 400 mg  400 mg Intravenous Once Jola Baptist Idol, PA      . ciprofloxacin (CIPRO) IVPB 400 mg  400 mg Intravenous Q12H Vania Rea      . cloNIDine (CATAPRES) tablet 0.3 mg  0.3 mg Oral BID Vania Rea      . diphenhydrAMINE (BENADRYL) 50 MG/ML injection        12.5 mg at 09/07/11 0242  . diphenhydrAMINE (BENADRYL) capsule 50 mg  50 mg Oral Q6H PRN Vania Rea       Or  . diphenhydrAMINE (BENADRYL) injection 25 mg  25 mg Intravenous Q6H PRN Vania Rea      . diphenhydrAMINE (BENADRYL) injection 12.5 mg  12.5 mg Intravenous Once [employer]   12.5 mg at 09/07/11 0238  . enoxaparin (LOVENOX) injection 40 mg  40 mg Subcutaneous Daily Vania Rea      . famotidine (PEPCID) 20-0.9 MG/50ML-% IVPB         20 mg at 09/07/11 0246  . famotidine (PEPCID) tablet 20 mg  20 mg Oral Q12H Vania Rea       Or  . famotidine (PEPCID) IVPB 20 mg  20 mg Intravenous Q12H Vania Rea      . HYDROmorphone (DILAUDID) injection 0.5 mg  0.5 mg Intravenous Once Candis Musa, PA   0.5 mg at 09/07/11 0042  . HYDROmorphone (DILAUDID) injection 1 mg  1 mg Intramuscular Once Candis Musa, PA   1 mg at 09/07/11 0207  . methylPREDNISolone sodium succinate (SOLU-MEDROL) 125 MG injection 60 mg  60 mg Intravenous STAT Leopold Campbell   60 mg at 09/07/11 0349  . metoprolol (LOPRESSOR) tablet 50 mg  50 mg Oral BID Vania Rea      . metroNIDAZOLE (FLAGYL) IVPB 500 mg  500 mg Intravenous Once Jola Baptist Idol, PA      . metroNIDAZOLE (FLAGYL) IVPB 500 mg  500 mg Intravenous Q8H Vania Rea      . morphine 4 MG/ML injection 4 mg  4 mg Intravenous Once Candis Musa, PA   4 mg at 09/07/11 0005  . nicotine (NICODERM CQ - dosed in mg/24 hours) patch 14 mg  14 mg Transdermal Daily Vania Rea   14 mg at 09/07/11 0518  . ondansetron (ZOFRAN) injection 4 mg  4 mg Intravenous Once Candis Musa, PA   4 mg at 09/07/11 0005  . ondansetron (ZOFRAN) tablet 4 mg  4 mg Oral Q4H PRN Vania Rea       Or  . ondansetron [facility]) injection 4 mg  4 mg Intravenous Q4H PRN Vania Rea      . pantoprazole (PROTONIX) injection 40 mg  40 mg Intravenous QHS Leopold Campbell   40 mg at 09/07/11 0430  . pneumococcal 23 valent vaccine (PNU-IMMUNE) injection 0.5 mL  0.5 mL Intramuscular Tomorrow-1000 Vania Rea      . simvastatin (ZOCOR) tablet 40 mg  40 mg Oral Q24H Vania Rea      . traZODone (DESYREL) tablet 25 mg  25 mg Oral QHS PRN Vania Rea       Allergies  Allergen Reactions  . Penicillins Anaphylaxis  . Contrast Media (Iodinated Diagnostic Agents) Swelling  . Dilaudid (Hydromorphone Hcl) Itching   Family History:There is no known family history of colorectal carcinoma , liver disease, or  inflammatory  bowel disease.   Problem Relation Age of Onset  . Multiple sclerosis Father   . Alzheimer's disease Mother     History   Social History  . Marital Status: Widowed    Spouse Name: N/A    Number of Children: 3  . Years of Education: N/A   Occupational History  . disabled, Best boy    Social History Main Topics  . Smoking status: Current Everyday Smoker -- 0.2 packs/day for 40 years    Types: Cigarettes  . Smokeless tobacco: Never Used  . Alcohol Use: No  . Drug Use: No  . Sexually Active: Not Currently    Birth Control/ Protection: Post-menopausal   Other Topics Concern  . Not on file   Social History Narrative   Lives alone  Review of Systems: Gen: See history of present illness CV: Denies chest pain, angina, palpitations, syncope, orthopnea, PND, peripheral edema, and claudication. Resp: Denies dyspnea at rest, dyspnea with exercise, cough, sputum, wheezing, coughing up blood, and pleurisy. GI: Denies vomiting blood, jaundice, and fecal incontinence.   GU : Denies urinary burning, blood in urine, urinary frequency, urinary hesitancy, nocturnal urination, and urinary incontinence. MS: Denies joint pain, limitation of movement, and swelling, stiffness, low back pain, extremity pain. Denies muscle weakness, cramps, atrophy.  Derm: Complains of pruritus status post medication yesterday. She was not given IV contrast for her CT given history of reaction.  Psych: Denies depression, anxiety, memory loss, suicidal ideation, hallucinations, paranoia, and confusion. Heme: Denies bruising, bleeding, and enlarged lymph nodes. Neuro:  Denies any headaches, dizziness, paresthesias. Endo:  Denies any problems with DM, thyroid, adrenal function.  Physical Exam: Vital signs in last 24 hours: Temp:  [98.3 F (36.8 C)] 98.3 F (36.8 C) (12/17 0411) Pulse Rate:  [71-101] 101  (12/17 0411) Resp:  [16-18] 16  (12/17 0411) BP: (139-161)/(60-93) 145/75 mmHg (12/17  0411) SpO2:  [93 %-99 %] 93 % (12/17 0411) Weight:  [130 lb (58.968 kg)-132 lb 7.9 oz (60.1 kg)] 132 lb 7.9 oz (60.1 kg) (12/17 0411) Last BM Date: 09/05/11 General:   Alert,  Well-developed, well-nourished, pleasant and cooperative in NAD. Head:  Normocephalic and atraumatic. Eyes:  Sclera clear, no icterus.   Conjunctiva pink. Ears:  Normal auditory acuity. Nose:  No deformity, discharge, or lesions. Mouth:  No deformity or lesions,oropharynx pink & moist. Neck:  Supple; no masses or thyromegaly. Lungs:  Clear throughout to auscultation.   No wheezes, crackles, or rhonchi. No acute distress. Heart:  Regular rate and rhythm; no murmurs, clicks, rubs,  or gallops. Abdomen:  Normal bowel sounds.  Left renal bruit.  Soft, non-distended.   Bilateral lower quadrant tenderness on palpation without masses, hepatosplenomegaly or hernias noted.  No guarding or rebound tenderness.   Rectal:  Deferred. Patient reports Hemoccult-negative in the emergency department. Msk:  Symmetrical withounormt gross deformities. Normal posture. Pulses:  Normal pulses noted. Extremities:  No clubbing or edema. Neurologic:  Alert and oriented x4;  grossly normal neurologically. Skin:  Intact without significant lesions or rashes. Lymph Nodes:  No significant cervical adenopathy. Psych:  Alert and cooperative. Normal mood and affect.  Lab Results:  [facility] 09/07/11 0440 09/06/11 2254  WBC 14.2* 16.5*  HGB 12.3 14.7  HCT 37.7 43.0  PLT 275 317   BMET  Basename 09/07/11 0440 09/06/11 2254  NA 138 136  K 3.4* 3.7  CL 102 97  CO2 25 27  GLUCOSE 143* 160*  BUN 11 9  CREATININE 0.69 0.68  CALCIUM 9.5 10.5   LFT  Basename 09/07/11 0440 09/06/11 2254  PROT 7.8 9.2*  ALBUMIN 3.7 4.4  AST 21 24  ALT 19 23  ALKPHOS 126* 149*  BILITOT 0.6 0.7  BILIDIR -- --  IBILI -- --  LIPASE -- 23  AMYLASE -- 68   PT/INR  Basename 09/07/11 0440  LABPROT 14.3  INR 1.09  Studies/Results: Ct Abdomen Pelvis Wo  Contrast  09/07/2011  *RADIOLOGY REPORT*  Clinical Data: Abdominal pain, nausea/vomiting  CT ABDOMEN AND PELVIS WITHOUT CONTRAST  Technique:  Multidetector CT imaging of the abdomen and pelvis was performed following the standard protocol without intravenous contrast.  Comparison: 09/22/2008  Findings: Lung bases are essentially clear.  Unenhanced liver, spleen, pancreas, and adrenal glands within normal limits.  Gallbladder is unremarkable.  No intrahepatic or extrahepatic ductal dilatation.  4.2 x 4.1 cm left upper pole renal cyst.  Right kidney is unremarkable.  No hydronephrosis.  No evidence of bowel obstruction.  Mild inflammatory stranding surrounding a loop of proximal sigmoid colon (series 2/image 46), possibly reflecting a mild colitis.  Atherosclerotic calcifications of the abdominal aorta and branch vessels.  No abdominopelvic ascites.  No suspicious abdominopelvic lymphadenopathy.  Status post hysterectomy.  No adnexal masses.  No ureteral or bladder calculi.  Bladder is low-lying.  Mild degenerative changes of the visualized thoracolumbar spine.  IMPRESSION: Mild inflammatory stranding surrounding a loop of proximal sigmoid colon, possibly reflecting a mild colitis.  No evidence of bowel obstruction.  No renal, ureteral, or bladder calculi.  Original Report Authenticated By: Charline Bills, M.D.    Impression: [name] is a pleasant 60 y.o. female with ten-day history of abdominal pain, nausea, and vomiting, worse over the past 2 days. She has history of GERD however she's been off PPI for approximately a year. Noncontrast CT shows mild inflammatory stranding surrounding a loop of proximal sigmoid colon, possible mild colitis, however this is nonspecific given lack of IV contrast. She does have chronic leukocytosis dating back at least 4 years ago. Colitis is a possibility, however there is no history of diarrhea, and she has chronic constipation with MiraLax. She will need further  evaluation to rule out peptic ulcer disease, H. pylori gastritis, or occult malignancy.  She has an isolated chronically elevated alkaline phosphatase which may require further workup.  She has never had colonoscopy.   Plan: 1. Agree with empiric Cipro and Flagyl 2. Agree with PPI 3. EGD with Dr. Darrick Penna today 4. Colonoscopy once nausea and vomiting resolves.   5. If she develops diarrhea, she will need a full set of stool studies 6.  hold Lovenox for EGD  We would like to thank you for the opportunity to participate in the care of [name].  LOS: 1 day   Lorenza Burton  09/07/2011, 8:45 AM [facility]

## 2011-09-07 NOTE — ED Provider Notes (Signed)
History     CSN: [id number] Arrival date & time: 09/06/2011 10:18 PM   First MD Initiated Contact with Patient 09/06/11 2256      Chief Complaint  Patient presents with  . Abdominal Pain  . Nausea  . Emesis    (Consider location/radiation/quality/duration/timing/severity/associated sxs/prior treatment) Patient is a 60 y.o. female presenting with abdominal pain and vomiting. The history is provided by the patient.  Abdominal Pain The primary symptoms of the illness include abdominal pain, nausea and vomiting. The primary symptoms of the illness do not include fever, shortness of breath or diarrhea. The problem has been gradually worsening.  The abdominal pain began 2 days ago. The pain came on gradually. The abdominal pain has been gradually worsening since its onset. The abdominal pain is located in the RLQ and LLQ. The abdominal pain does not radiate. The severity of the abdominal pain is 10/10. The abdominal pain is relieved by nothing. The abdominal pain is exacerbated by movement (palpation).  Change in bowel habit: She had a small stool today which was normal.   Additional symptoms associated with the illness include anorexia. Symptoms associated with the illness do not include heartburn, constipation, hematuria or frequency.  Emesis  Associated symptoms include abdominal pain. Pertinent negatives include no arthralgias, no diarrhea, no fever and no headaches.    Past Medical History  Diagnosis Date  . Myocardial infarct   . Hypertension     Past Surgical History  Procedure Date  . Coronary artery bypass graft     No family history on file.  History  Substance Use Topics  . Smoking status: Current Everyday Smoker  . Smokeless tobacco: Not on file  . Alcohol Use: No    OB History    Grav Para Term Preterm Abortions TAB SAB Ect Mult Living                  Review of Systems  Constitutional: Negative for fever.  HENT: Negative for congestion, sore throat and  neck pain.   Eyes: Negative.   Respiratory: Negative for chest tightness and shortness of breath.   Cardiovascular: Negative for chest pain.  Gastrointestinal: Positive for nausea, vomiting, abdominal pain, abdominal distention and anorexia. Negative for heartburn, diarrhea, constipation and rectal pain.  Genitourinary: Negative.  Negative for frequency and hematuria.  Musculoskeletal: Negative for joint swelling and arthralgias.  Skin: Negative.  Negative for rash and wound.  Neurological: Negative for dizziness, weakness, light-headedness, numbness and headaches.  Hematological: Negative.   Psychiatric/Behavioral: Negative.     Allergies  Penicillins  Home Medications  No current outpatient prescriptions on file.  BP 161/60  Pulse 74  Temp(Src) 98.3 F (36.8 C) (Oral)  Resp 18  Ht 5' (1.524 m)  Wt 130 lb (58.968 kg)  BMI 25.39 kg/m2  SpO2 95%  Physical Exam  Nursing note and vitals reviewed. Constitutional: She is oriented to person, place, and time. She appears well-developed and well-nourished.  HENT:  Head: Normocephalic and atraumatic.  Eyes: Conjunctivae are normal.  Neck: Normal range of motion.  Cardiovascular: Normal rate, regular rhythm, normal heart sounds and intact distal pulses.   Pulmonary/Chest: Effort normal and breath sounds normal. She has no wheezes.  Abdominal: Soft. She exhibits distension. She exhibits no mass. Bowel sounds are increased. There is tenderness in the right lower quadrant and left lower quadrant. There is no rigidity, no rebound, no guarding and no CVA tenderness.       Increased tympany to percussion  Genitourinary:  Rectum normal. Rectal exam shows no tenderness. Guaiac negative stool.  Musculoskeletal: Normal range of motion.  Neurological: She is alert and oriented to person, place, and time.  Skin: Skin is warm and dry.  Psychiatric: She has a normal mood and affect.    ED Course  Procedures (including critical care  time)  Labs Reviewed  COMPREHENSIVE METABOLIC PANEL - Abnormal; Notable for the following:    Glucose, Bld 160 (*)    Total Protein 9.2 (*)    Alkaline Phosphatase 149 (*)    All other components within normal limits  URINALYSIS, ROUTINE W REFLEX MICROSCOPIC - Abnormal; Notable for the following:    Specific Gravity, Urine >1.030 (*)    Hgb urine dipstick SMALL (*)    Ketones, ur 40 (*)    Protein, ur 100 (*)    All other components within normal limits  CBC - Abnormal; Notable for the following:    WBC 16.5 (*)    All other components within normal limits  DIFFERENTIAL - Abnormal; Notable for the following:    Neutrophils Relative 81 (*)    Neutro Abs 13.4 (*)    All other components within normal limits  URINE MICROSCOPIC-ADD ON - Abnormal; Notable for the following:    Squamous Epithelial / LPF MANY (*)    Bacteria, UA FEW (*)    All other components within normal limits  AMYLASE  LIPASE, BLOOD   Ct Abdomen Pelvis Wo Contrast  09/07/2011  *RADIOLOGY REPORT*  Clinical Data: Abdominal pain, nausea/vomiting  CT ABDOMEN AND PELVIS WITHOUT CONTRAST  Technique:  Multidetector CT imaging of the abdomen and pelvis was performed following the standard protocol without intravenous contrast.  Comparison: 09/22/2008  Findings: Lung bases are essentially clear.  Unenhanced liver, spleen, pancreas, and adrenal glands within normal limits.  Gallbladder is unremarkable.  No intrahepatic or extrahepatic ductal dilatation.  4.2 x 4.1 cm left upper pole renal cyst.  Right kidney is unremarkable.  No hydronephrosis.  No evidence of bowel obstruction.  Mild inflammatory stranding surrounding a loop of proximal sigmoid colon (series 2/image 46), possibly reflecting a mild colitis.  Atherosclerotic calcifications of the abdominal aorta and branch vessels.  No abdominopelvic ascites.  No suspicious abdominopelvic lymphadenopathy.  Status post hysterectomy.  No adnexal masses.  No ureteral or bladder  calculi.  Bladder is low-lying.  Mild degenerative changes of the visualized thoracolumbar spine.  IMPRESSION: Mild inflammatory stranding surrounding a loop of proximal sigmoid colon, possibly reflecting a mild colitis.  No evidence of bowel obstruction.  No renal, ureteral, or bladder calculi.  Original Report Authenticated By: Charline Bills, M.D.     No diagnosis found.    MDM  Colitis.  Patient continues to have significant pain despite morphine and dilaudid doses.  Call to Dr. Orvan Falconer for admission.  Cipro IV and flagyl IV ordered.        Candis Musa, PA 09/07/11 [phone]

## 2011-09-07 NOTE — ED Provider Notes (Signed)
Medical screening examination/treatment/procedure(s) were performed by non-physician practitioner and as supervising physician I was immediately available for consultation/collaboration.  Flint Melter, MD 09/07/11 [phone]

## 2011-09-07 NOTE — ED Notes (Addendum)
Antibiotics not given per dr Orvan Falconer. He states that he wants her itching to stop before she receives any antibiotics

## 2011-09-07 NOTE — Consult Note (Signed)
NV 2o to ?acure viral illness, infectious colitis and/or uncontrolled GERD. NO DIARRHEA. REVIEWED CT WITH DR. Montel Culver colitis(MILD)-NO DIVERTICULOSIS. EGD TODAY.

## 2011-09-08 LAB — RAPID URINE DRUG SCREEN, HOSP PERFORMED
Amphetamines: NOT DETECTED
Opiates: NOT DETECTED

## 2011-09-08 LAB — CBC
MCH: 32.4 pg (ref 26.0–34.0)
MCHC: 32.5 g/dL (ref 30.0–36.0)
MCV: 99.7 fL (ref 78.0–100.0)
Platelets: 273 10*3/uL (ref 150–400)
RDW: 12.6 % (ref 11.5–15.5)

## 2011-09-08 LAB — BASIC METABOLIC PANEL
CO2: 27 mEq/L (ref 19–32)
Calcium: 9.9 mg/dL (ref 8.4–10.5)
Creatinine, Ser: 0.76 mg/dL (ref 0.50–1.10)
GFR calc Af Amer: >90 mL/min (ref >90)
GFR calc non Af Amer: 89 mL/min — ABNORMAL LOW (ref >90)

## 2011-09-08 NOTE — Progress Notes (Signed)
PNA vaccine given in pts left arm. Tolerated well.

## 2011-09-08 NOTE — Progress Notes (Signed)
Subjective: She feels well.  Denies any  N/V or abd pain.  No diarrhea.  "I want eggs & bacon." & questions whether she can have regular diet.   Objective: Vital signs in last 24 hours: Temp:  [98.4 F (36.9 C)-99.2 F (37.3 C)] 98.4 F (36.9 C) (12/18 0605) Pulse Rate:  [66-107] 80  (12/18 0608) Resp:  [18] 18  (12/18 0605) BP: (107-154)/(65-106) 117/71 mmHg (12/18 0608) SpO2:  [96 %-100 %] 100 % (12/18 0605) Weight:  [135 lb (61.236 kg)-137 lb 9.6 oz (62.415 kg)] 137 lb 9.6 oz (62.415 kg) (12/18 0500) Last BM Date: 09/07/11 General:   Alert,  Well-developed, well-nourished, pleasant and cooperative in NAD Eyes:  Sclera clear, no icterus.   Conjunctiva pink. Mouth:  No deformity or lesions, oropharynx pink & moist. Neck:  Supple; no masses or thyromegaly. Heart:  Regular rate and rhythm; no murmurs, clicks, rubs,  or gallops. Abdomen:  Soft, nontender and nondistended. No masses, hepatosplenomegaly or hernias noted. Normal bowel sounds, without guarding, and without rebound.   Msk:  Symmetrical without gross deformities. Pulses:  Normal pulses noted. Extremities:  Without clubbing or edema. Neurologic:  Alert and  oriented x4;  grossly normal neurologically. Skin:  Intact without significant lesions or rashes. Psych:  Alert and cooperative. Normal mood and affect.  Intake/Output from previous day: 12/17 0701 - 12/18 0700 In: 2820 [P.O.:720; I.V.:1700; IV Piggyback:400] Out: 1800 [Urine:1800]  Lab Results:  Basename 09/08/11 0443 09/07/11 0440 09/06/11 2254  WBC 17.7* 14.2* 16.5*  HGB 11.9* 12.3 14.7  HCT 36.6 37.7 43.0  PLT 273 275 317   BMET  Basename 09/08/11 0443 09/07/11 0440 09/06/11 2254  NA 141 138 136  K 3.9 3.4* 3.7  CL 104 102 97  CO2 27 25 27   GLUCOSE 128* 143* 160*  BUN 7 11 9   CREATININE 0.76 0.69 0.68  CALCIUM 9.9 9.5 10.5   LFT  Basename 09/07/11 0440 09/06/11 2254  PROT 7.8 9.2*  ALBUMIN 3.7 4.4  AST 21 24  ALT 19 23  ALKPHOS 126* 149*    BILITOT 0.6 0.7  BILIDIR -- --  IBILI -- --  LIPASE -- 23  AMYLASE -- 68   PT/INR  Basename 09/07/11 0440  LABPROT 14.3  INR 1.09   Studies/Results: Ct Abdomen Pelvis Wo Contrast  09/07/2011  *RADIOLOGY REPORT*  Clinical Data: Abdominal pain, nausea/vomiting  CT ABDOMEN AND PELVIS WITHOUT CONTRAST  Technique:  Multidetector CT imaging of the abdomen and pelvis was performed following the standard protocol without intravenous contrast.  Comparison: 09/22/2008  Findings: Lung bases are essentially clear.  Unenhanced liver, spleen, pancreas, and adrenal glands within normal limits.  Gallbladder is unremarkable.  No intrahepatic or extrahepatic ductal dilatation.  4.2 x 4.1 cm left upper pole renal cyst.  Right kidney is unremarkable.  No hydronephrosis.  No evidence of bowel obstruction.  Mild inflammatory stranding surrounding a loop of proximal sigmoid colon (series 2/image 46), possibly reflecting a mild colitis.  Atherosclerotic calcifications of the abdominal aorta and branch vessels.  No abdominopelvic ascites.  No suspicious abdominopelvic lymphadenopathy.  Status post hysterectomy.  No adnexal masses.  No ureteral or bladder calculi.  Bladder is low-lying.  Mild degenerative changes of the visualized thoracolumbar spine.  IMPRESSION: Mild inflammatory stranding surrounding a loop of proximal sigmoid colon, possibly reflecting a mild colitis.  No evidence of bowel obstruction.  No renal, ureteral, or bladder calculi.  Original Report Authenticated By: Charline Bills, M.D.    Assessment: 1.  Acute colitis: Clinically improved.  No diarrhea. 2. Gastritis: Bx pending.   Plan: 1. FU biopsies 2. Continue BID Protonix 3. Zofran ATC & prn 4. Outpatient colonoscopy 5. Complete cipro & flagyl x5 more days 6. Advance diet as tolerated-Heart Healthy 7. Would change antibiotics & zofran to PO if pt tolerates diet well today  LOS: 2 days   Lorenza Burton  09/08/2011, 7:55 AM   Pt seen  and examined; agree with above assessment and recommendations. Hopefully, discharge in next 24 hrs if she tolerates diet

## 2011-09-08 NOTE — Progress Notes (Signed)
NAMEKRISTE, BROMAN             ACCOUNT NO.:  [id number]  MEDICAL RECORD NO.:  [id number]  LOCATION:  A317                          FACILITY:  [facility]  PHYSICIAN:  Jamelia Varano D. Felecia Shelling, MD   DATE OF BIRTH:  1951/02/13  DATE OF PROCEDURE:  09/08/2011 DATE OF DISCHARGE:                                PROGRESS NOTE   SUBJECTIVE:  The patient still complains of lower abdominal pain.  Her nausea and vomiting is better.  She had endoscopy yesterday.  OBJECTIVE:  GENERAL:  The patient is alert, awake, and sick looking. VITAL SIGNS:  Blood pressure 107/65, pulse 66, respiratory rate 18, temperature 98.4 degrees Fahrenheit. CHEST:  Decreased air entry.  Few rhonchi. CARDIOVASCULAR SYSTEM:  First and second heart sounds heard.  No murmur. No gallop. ABDOMEN:  Soft and lax.  Bowel sound is positive.  No mass or organomegaly. EXTREMITIES:  No leg edema.  LABORATORY DATA:  WBC 17.7, hemoglobin is 11.9, hematocrit 36.6, and platelet 273.  BMP:  Sodium 141, potassium 3.9, chloride 104, carbon dioxide 27, glucose 128, BUN 7 creatinine 0.6, calcium 9.9.  ASSESSMENT: 1. Colitis. 2. Gastroesophageal reflux disease. 3. Hypertension. 4. Leukocytosis. 5. Coronary artery disease.  PLAN:  GI consult is appreciated.  We will continue the patient on IV antibiotics.  Continue current treatment and supportive care.     Darris Carachure D. Felecia Shelling, MD     TDF/MEDQ  D:  09/08/2011  T:  09/08/2011  Job:  [id number]

## 2011-09-09 ENCOUNTER — Encounter: Payer: Self-pay | Admitting: Gastroenterology

## 2011-09-09 DIAGNOSIS — K5289 Other specified noninfective gastroenteritis and colitis: Secondary | ICD-10-CM

## 2011-09-09 DIAGNOSIS — K2970 Gastritis, unspecified, without bleeding: Secondary | ICD-10-CM

## 2011-09-09 DIAGNOSIS — K2990 Gastroduodenitis, unspecified, without bleeding: Secondary | ICD-10-CM

## 2011-09-09 MED ORDER — METRONIDAZOLE 500 MG PO TABS: 500.0000 mg | ORAL_TABLET | Freq: Three times a day (TID) | ORAL | Status: AC

## 2011-09-09 MED ORDER — CIPROFLOXACIN HCL 500 MG PO TABS: 500.0000 mg | ORAL_TABLET | Freq: Two times a day (BID) | ORAL | Status: AC

## 2011-09-09 NOTE — Discharge Summary (Signed)
NAMEBRECK, HOLLINGER             ACCOUNT NO.:  [id number]  MEDICAL RECORD NO.:  [id number]  LOCATION:  A317                          FACILITY:  [facility]  PHYSICIAN:  Linley Moskal D. Felecia Shelling, MD   DATE OF BIRTH:  Apr 25, 1951  DATE OF ADMISSION:  09/06/2011 DATE OF DISCHARGE:  12/19/2012LH                              DISCHARGE SUMMARY   DISCHARGE DIAGNOSES: 1. Mild colitis. 2. Gastroesophageal reflux disease. 3. Hypertension. 4. Leukocytosis. 5. Coronary artery disease.  DISCHARGE MEDICATIONS: 1. Cipro 500 mg p.o. b.i.d. for 5 days. 2. Flagyl 500 mg t.i.d. for 5 days. 3. Norvasc 10 mg p.o. daily. 4. Aspirin 81 mg p.o. daily. 5. Tramadol 50 mg q.8 hours p.r.n. 6. Clonidine 0.3 mg p.o. b.i.d. 7. Metoprolol 50 mg p.o. b.i.d. 8. Protonix 40 mg p.o. daily.  DISPOSITION:  The patient will be discharged home in stable condition.  DISCHARGE INSTRUCTIONS:  The patient will be followed in my office in 1 week.  She will be followed also in GI Clinic in 2 weeks' time.  LABS ON DISCHARGE:  CBC:  WBC 17.7, hemoglobin 11.9, hematocrit 36.6, and platelets 273.  BMP:  Sodium 141, potassium 3.9, chloride 104, carbon dioxide 27, glucose 128, BUN 7, creatinine 0.76, and calcium 9.9.  HOSPITAL COURSE:  This is a 60 year old female patient with history of multiple medical illnesses, came to emergency room due to abdominal pain, nausea, and vomiting.  Her pain was localized in the lower quadrants.  She had a CT scan of the abdomen which showed mild sigmoid colitis.  The patient was started on combination of Cipro and Flagyl.  She was seen by gastroenterologist and had endoscopy.  Over the hospital stay, the patient improved.  She is able to tolerate a solid food.  She is moving her bowels.  The patient will be discharged on oral antibiotics and will be followed in outpatient for possible colonoscopy.     Daylyn Christine D. Felecia Shelling, MD     TDF/MEDQ  D:  09/09/2011  T:  09/09/2011  Job:  [id number]

## 2011-09-09 NOTE — Progress Notes (Unsigned)
Please schedule outpatient f/u appt (was in hospital) in next 4 weeks for consideration of outpatient colonoscopy.

## 2011-09-09 NOTE — Progress Notes (Signed)
CARE MANAGEMENT NOTE 09/09/2011  Patient:  [name], [name]   Account Number:  [id number]  Date Initiated:  09/07/2011  Documentation initiated by:  Rosemary Holms  Subjective/Objective Assessment:   Pt was admitted with abdominal pain. PTA lived alone but has sons who assist her     Action/Plan:   Pt does not anticipate DC HH needs. CM to follow.   Anticipated DC Date:  09/09/2011   Anticipated DC Plan:  HOME/SELF CARE         Choice offered to / List presented to:             Status of service:  Completed, signed off Medicare Important Message given?   (If response is "NO", the following Medicare IM given date fields will be blank) Date Medicare IM given:   Date Additional Medicare IM given:    Discharge Disposition:  HOME/SELF CARE  Per UR Regulation:    Comments:  09/07/11 1500 Alessio Bogan Robosn RN BSN CM

## 2011-09-09 NOTE — Progress Notes (Signed)
Subjective: No N/V. No diarrhea. Denies abdominal pain. States also had hemorrhoid surgery in past and wanted to add it to list. Tolerating diet. Wants to go home.   Objective: Vital signs in last 24 hours: Temp:  [97.5 F (36.4 C)-98.3 F (36.8 C)] 98 F (36.7 C) (12/19 0555) Pulse Rate:  [50-77] 71  (12/19 0557) Resp:  [18-20] 20  (12/19 0555) BP: (93-127)/(56-77) 118/69 mmHg (12/19 0557) SpO2:  [90 %-99 %] 90 % (12/19 0555) Weight:  [140 lb 10.5 oz (63.8 kg)] 140 lb 10.5 oz (63.8 kg) (12/18 2300) Last BM Date: 09/08/11 General:   Alert and oriented, pleasant Head:  Normocephalic and atraumatic. Eyes:  No icterus, sclera clear. Conjuctiva pink.  Mouth:  Without lesions, mucosa pink and moist.  Heart:  S1, S2 present, no murmurs noted.  Lungs: Clear to auscultation bilaterally, without wheezing, rales, or rhonchi.  Abdomen:  Bowel sounds present, soft, non-tender, non-distended. No HSM or hernias noted. No rebound or guarding. No masses appreciated  Msk:  Symmetrical without gross deformities. Normal posture. Extremities:  Without clubbing or edema. Neurologic:  Alert and  oriented x4;  grossly normal neurologically. Skin:  Warm and dry, intact without significant lesions.  Psych:  Alert and cooperative. Normal mood and affect.  Intake/Output from previous day: 12/18 0701 - 12/19 0700 In: 1020 [P.O.:1020] Out: 125 [Urine:125] Intake/Output this shift:    Lab Results:  Basename 09/08/11 0443 09/07/11 0440 09/06/11 2254  WBC 17.7* 14.2* 16.5*  HGB 11.9* 12.3 14.7  HCT 36.6 37.7 43.0  PLT 273 275 317   BMET  Basename 09/08/11 0443 09/07/11 0440 09/06/11 2254  NA 141 138 136  K 3.9 3.4* 3.7  CL 104 102 97  CO2 27 25 27   GLUCOSE 128* 143* 160*  BUN 7 11 9   CREATININE 0.76 0.69 0.68  CALCIUM 9.9 9.5 10.5   LFT  Basename 09/07/11 0440 09/06/11 2254  PROT 7.8 9.2*  ALBUMIN 3.7 4.4  AST 21 24  ALT 19 23  ALKPHOS 126* 149*  BILITOT 0.6 0.7  BILIDIR -- --  IBILI  -- --   PT/INR  Basename 09/07/11 0440  LABPROT 14.3  INR 1.09    Assessment: 60 year old female with colitis on CT, no diarrhea, clinically improved and appropriate for d/c with Cipro and Flagyl. Also noted patent esophageal ring on EGD with mild gastritis, biopsy negative for H.pylori.   Plan: Stable for d/c home PPI BID Continue Cipro and Flagyl as outpatient X 4 days Follow-up in clinic for outpatient colonoscopy    LOS: 3 days   Gerrit Halls  09/09/2011, 7:16 AM

## 2011-09-09 NOTE — Progress Notes (Signed)
Discharge Summary: a/o.vss. Saline lock removed. Up ad lib. No complaints of pain or any distress. Discharge instructions given. Pt verbalized understanding of instructions. Left floor ambulatory with nursing staff and family member.

## 2011-09-18 ENCOUNTER — Encounter (HOSPITAL_COMMUNITY): Payer: Self-pay | Admitting: Gastroenterology

## 2011-09-21 NOTE — Progress Notes (Signed)
Pt is scheduled w/SLF on 01/16

## 2011-10-07 ENCOUNTER — Ambulatory Visit (INDEPENDENT_AMBULATORY_CARE_PROVIDER_SITE_OTHER): Payer: Medicare Other | Admitting: Gastroenterology

## 2011-10-07 ENCOUNTER — Encounter: Payer: Self-pay | Admitting: Gastroenterology

## 2011-10-07 VITALS — BP 122/71 | HR 83 | Temp 98.0°F | Ht 60.0 in | Wt 138.0 lb

## 2011-10-07 DIAGNOSIS — K5289 Other specified noninfective gastroenteritis and colitis: Secondary | ICD-10-CM

## 2011-10-07 DIAGNOSIS — K2990 Gastroduodenitis, unspecified, without bleeding: Secondary | ICD-10-CM

## 2011-10-07 DIAGNOSIS — K2970 Gastritis, unspecified, without bleeding: Secondary | ICD-10-CM

## 2011-10-07 DIAGNOSIS — K529 Noninfective gastroenteritis and colitis, unspecified: Secondary | ICD-10-CM

## 2011-10-07 MED ORDER — OMEPRAZOLE 20 MG PO CPDR: 20.0000 mg | DELAYED_RELEASE_CAPSULE | Freq: Every day | ORAL | Status: DC

## 2011-10-07 NOTE — Assessment & Plan Note (Addendum)
RESOLVED. PT NEEDS COLON CA SCREENING. ONLY HAS MEDICARE.  PT WILL CALL ME WHEN SHE'S READY. SEE [name] TO ASK ABOUT FINANCIAL ASSISTANCE.  STOP SMOKING.

## 2011-10-07 NOTE — Progress Notes (Signed)
  Subjective:    Patient ID: [name], female    DOB: May 27, 1951, 61 y.o.   MRN: [id number]  PCP: Felecia Shelling  HPI Last seen and evaluated by me DEC 2012: INPT. NOW STARTING TO SMOKE AGO. No abdominal pain, nausea, or vomiting. BMs: REGULAR SINCE HOSPITAL. TAKING MLX QD BUT NOW REGULAR AND SHE DOESN'T NEED.   Past Medical History  Diagnosis Date  . Myocardial infarct     1997, 2004  . Hypertension   . Hyperlipidemia   . CVA (cerebral infarction)   . Severe major depression with psychotic features     2004  . Anxiety   . Blind     right  . GERD (gastroesophageal reflux disease)     Past Surgical History  Procedure Date  . Abdominal hysterectomy   . Lumps removed from each breast   . Bilateral salpingoophorectomy   . Coronary angioplasty with stent placement 7829,[id number]  . Esophagogastroduodenoscopy 09/05/2004    Dr Jimmye Norman gastritis, candida esophagitis  . Cyst removed from left wrist   . Esophagogastroduodenoscopy 09/07/2011    Procedure: ESOPHAGOGASTRODUODENOSCOPY (EGD);  Surgeon: Arlyce Harman, MD;  Location: [facility];  Service: Endoscopy;  Laterality: N/A;    Allergies  Allergen Reactions  . Penicillins Anaphylaxis  . Aspirin Swelling and Other (See Comments)    Only in large doses will cause a reaction  . Bee Venom Swelling    Severe swelling  . Contrast Media (Iodinated Diagnostic Agents) Swelling  . Dilaudid (Hydromorphone Hcl) Itching  . Lisinopril Swelling  . Motrin (Ibuprofen) Swelling    Current Outpatient Prescriptions  Medication Sig Dispense Refill  . albuterol (PROVENTIL HFA;VENTOLIN HFA) 108 (90 BASE) MCG/ACT inhaler Inhale 2 puffs into the lungs every 6 (six) hours as needed.       Marland Kitchen amLODipine (NORVASC) 10 MG tablet Take 10 mg by mouth daily.       Marland Kitchen aspirin EC 81 MG tablet Take 81 mg by mouth daily.        . cloNIDine (CATAPRES) 0.3 MG tablet Take 0.3 mg by mouth 2 (two) times daily.        Marland Kitchen docusate sodium (COLACE) 100 MG capsule  Take 100 mg by mouth daily.        . metoprolol (LOPRESSOR) 50 MG tablet Take 50 mg by mouth 2 (two) times daily.       . Multiple Vitamins-Minerals (MULTIVITAMINS THER. W/MINERALS) TABS Take 1 tablet by mouth daily.        . simvastatin (ZOCOR) 40 MG tablet Take 40 mg by mouth daily.            Review of Systems     Objective:   Physical Exam  Vitals reviewed. Constitutional: She is oriented to person, place, and time. She appears well-developed and well-nourished. She appears distressed.  HENT:  Head: Normocephalic and atraumatic.  Neck: Normal range of motion. Neck supple.  Cardiovascular: Normal rate, regular rhythm and normal heart sounds.   Pulmonary/Chest: Effort normal and breath sounds normal. No respiratory distress.  Abdominal: Soft. Bowel sounds are normal. There is no tenderness.  Musculoskeletal: She exhibits edema (TRACE BIL).  Neurological: She is alert and oriented to person, place, and time.       NO FOCAL DEFICITS   Psychiatric:       ANXIOUS MOOD          Assessment & Plan:

## 2011-10-07 NOTE — Assessment & Plan Note (Signed)
BIOPSY RESULTS DISCUSSED.  ADD PRILOSEC EVERY MORNING FOREVER. FOLLOW UP IN 6 MOS.

## 2011-10-07 NOTE — Patient Instructions (Signed)
STOP SMOKING. ADD PRILOSEC EVERY MORNING FOREVER. FOLLOW UP IN 6 MOS. YOU NEED A COLONOSCOPY. CALL ME WHEN YOUR ARE READY. SEE BETTY RATLIFF TO ASK ABOUT FINANCIAL ASSISTANCE.

## 2011-10-07 NOTE — Progress Notes (Signed)
Faxed to PCP

## 2011-10-14 NOTE — Progress Notes (Signed)
Reminder in epic to follow up in 6 months °

## 2012-09-28 ENCOUNTER — Telehealth: Payer: Self-pay | Admitting: *Deleted

## 2012-09-28 NOTE — Telephone Encounter (Signed)
Called many rings and no answer.  

## 2012-09-28 NOTE — Telephone Encounter (Signed)
[name] called today. She had to cancel her appt for tomorrow and reschedule til Feb, however she needs a refill on one of her medications. Please call her back. Thanks.

## 2012-09-29 ENCOUNTER — Ambulatory Visit: Payer: Medicare Other | Admitting: Gastroenterology

## 2012-09-29 NOTE — Telephone Encounter (Signed)
Called. Many rings and no answer.  

## 2012-10-03 ENCOUNTER — Encounter (HOSPITAL_COMMUNITY): Payer: Self-pay | Admitting: *Deleted

## 2012-10-03 ENCOUNTER — Emergency Department (HOSPITAL_COMMUNITY): Payer: Medicare Other

## 2012-10-03 ENCOUNTER — Telehealth: Payer: Self-pay | Admitting: Gastroenterology

## 2012-10-03 ENCOUNTER — Emergency Department (HOSPITAL_COMMUNITY)
Admission: EM | Admit: 2012-10-03 | Discharge: 2012-10-03 | Disposition: A | Discharge status: Home / Self Care | Payer: Medicare Other | Attending: Emergency Medicine | Admitting: Emergency Medicine

## 2012-10-03 DIAGNOSIS — I252 Old myocardial infarction: Secondary | ICD-10-CM | POA: Insufficient documentation

## 2012-10-03 DIAGNOSIS — I10 Essential (primary) hypertension: Secondary | ICD-10-CM | POA: Insufficient documentation

## 2012-10-03 DIAGNOSIS — Z8679 Personal history of other diseases of the circulatory system: Secondary | ICD-10-CM | POA: Insufficient documentation

## 2012-10-03 DIAGNOSIS — Z87891 Personal history of nicotine dependence: Secondary | ICD-10-CM | POA: Insufficient documentation

## 2012-10-03 DIAGNOSIS — Z7982 Long term (current) use of aspirin: Secondary | ICD-10-CM | POA: Insufficient documentation

## 2012-10-03 DIAGNOSIS — K5289 Other specified noninfective gastroenteritis and colitis: Secondary | ICD-10-CM | POA: Insufficient documentation

## 2012-10-03 DIAGNOSIS — K529 Noninfective gastroenteritis and colitis, unspecified: Secondary | ICD-10-CM

## 2012-10-03 DIAGNOSIS — Z90710 Acquired absence of both cervix and uterus: Secondary | ICD-10-CM | POA: Insufficient documentation

## 2012-10-03 DIAGNOSIS — E785 Hyperlipidemia, unspecified: Secondary | ICD-10-CM | POA: Insufficient documentation

## 2012-10-03 DIAGNOSIS — R61 Generalized hyperhidrosis: Secondary | ICD-10-CM | POA: Insufficient documentation

## 2012-10-03 DIAGNOSIS — R197 Diarrhea, unspecified: Secondary | ICD-10-CM | POA: Insufficient documentation

## 2012-10-03 DIAGNOSIS — H5440 Blindness, one eye, unspecified eye: Secondary | ICD-10-CM | POA: Insufficient documentation

## 2012-10-03 DIAGNOSIS — Z9079 Acquired absence of other genital organ(s): Secondary | ICD-10-CM | POA: Insufficient documentation

## 2012-10-03 DIAGNOSIS — R509 Fever, unspecified: Secondary | ICD-10-CM | POA: Insufficient documentation

## 2012-10-03 DIAGNOSIS — K219 Gastro-esophageal reflux disease without esophagitis: Secondary | ICD-10-CM | POA: Insufficient documentation

## 2012-10-03 DIAGNOSIS — Z8659 Personal history of other mental and behavioral disorders: Secondary | ICD-10-CM | POA: Insufficient documentation

## 2012-10-03 DIAGNOSIS — Z79899 Other long term (current) drug therapy: Secondary | ICD-10-CM | POA: Insufficient documentation

## 2012-10-03 DIAGNOSIS — Z8673 Personal history of transient ischemic attack (TIA), and cerebral infarction without residual deficits: Secondary | ICD-10-CM | POA: Insufficient documentation

## 2012-10-03 DIAGNOSIS — R110 Nausea: Secondary | ICD-10-CM | POA: Insufficient documentation

## 2012-10-03 LAB — URINALYSIS, ROUTINE W REFLEX MICROSCOPIC
Bilirubin Urine: NEGATIVE
Hgb urine dipstick: NEGATIVE
Specific Gravity, Urine: 1.02 (ref 1.005–1.030)
pH: 6 (ref 5.0–8.0)

## 2012-10-03 LAB — CBC WITH DIFFERENTIAL/PLATELET
Basophils Absolute: 0 10*3/uL (ref 0.0–0.1)
Eosinophils Relative: 0 % (ref 0–5)
Lymphocytes Relative: 19 % (ref 12–46)
MCV: 96.2 fL (ref 78.0–100.0)
Neutrophils Relative %: 77 % (ref 43–77)
Platelets: 305 10*3/uL (ref 150–400)
RDW: 12.3 % (ref 11.5–15.5)
WBC: 12.2 10*3/uL — ABNORMAL HIGH (ref 4.0–10.5)

## 2012-10-03 LAB — COMPREHENSIVE METABOLIC PANEL
ALT: 18 U/L (ref 0–35)
AST: 23 U/L (ref 0–37)
CO2: 28 mEq/L (ref 19–32)
Calcium: 9.8 mg/dL (ref 8.4–10.5)
GFR calc non Af Amer: 89 mL/min — ABNORMAL LOW (ref >90)
Sodium: 138 mEq/L (ref 135–145)
Total Protein: 8.5 g/dL — ABNORMAL HIGH (ref 6.0–8.3)

## 2012-10-03 LAB — URINE MICROSCOPIC-ADD ON

## 2012-10-03 MED ORDER — PROMETHAZINE HCL 25 MG PO TABS: 25.0000 mg | ORAL_TABLET | Freq: Four times a day (QID) | ORAL | Status: DC | PRN

## 2012-10-03 MED ORDER — SODIUM CHLORIDE 0.9 % IV BOLUS (SEPSIS)
1000.0000 mL | Freq: Once | INTRAVENOUS | Status: AC
Start: 2012-10-03 — End: 2012-10-03
  Administered 2012-10-03: 1000 mL via INTRAVENOUS

## 2012-10-03 MED ORDER — ONDANSETRON HCL 4 MG/2ML IJ SOLN
4.0000 mg | Freq: Once | INTRAMUSCULAR | Status: AC
  Administered 2012-10-03: 4 mg via INTRAVENOUS
  Filled 2012-10-03: qty 2

## 2012-10-03 MED ORDER — HYDROMORPHONE HCL PF 1 MG/ML IJ SOLN
1.0000 mg | Freq: Once | INTRAMUSCULAR | Status: AC
  Administered 2012-10-03: 1 mg via INTRAVENOUS
  Filled 2012-10-03: qty 1

## 2012-10-03 MED ORDER — TRAMADOL HCL 50 MG PO TABS: 50.0000 mg | ORAL_TABLET | Freq: Four times a day (QID) | ORAL | Status: DC | PRN

## 2012-10-03 NOTE — ED Provider Notes (Signed)
History  This chart was scribed for April Lennert, MD by Erskine Emery, ED Scribe. This patient was seen in room [facility] and the patient's care was started at 15:54.   CSN: [id number]  Arrival date & time 10/03/12  1511   First MD Initiated Contact with Patient 10/03/12 1554      Chief Complaint  Patient presents with  . Abdominal Pain  . Diarrhea    (Consider location/radiation/quality/duration/timing/severity/associated sxs/prior Treatment) [name] is a 62 y.o. female who presents to the Emergency Department complaining of constant generalized abdominal pain, nausea, and watery and bloody diarrhea since 04:00 this morning. Pt reports some associated fevers, chills, and diaphoresis but denies any associated emesis, just dry heaving. Pt reports one episode of similar symptoms a year ago, for which she was admitted. Patient is a 62 y.o. female presenting with abdominal pain and diarrhea. The history is provided by the patient. No language interpreter was used.  Abdominal Pain The primary symptoms of the illness include abdominal pain, nausea and diarrhea. The primary symptoms of the illness do not include fever, fatigue, vomiting, hematemesis, hematochezia, dysuria or vaginal bleeding. The current episode started 6 to 12 hours ago. The onset of the illness was gradual. The problem has not changed since onset. The illness is associated with awakening from sleep. The patient states that she believes she is currently not pregnant. The patient has had a change in bowel habit. Additional symptoms associated with the illness include chills and diaphoresis. Symptoms associated with the illness do not include anorexia, constipation, urgency, hematuria, frequency or back pain. Significant associated medical issues include GERD. Significant associated medical issues do not include diabetes, sickle cell disease, gallstones or HIV.  Diarrhea The primary symptoms include abdominal pain,  nausea and diarrhea. Primary symptoms do not include fever, fatigue, vomiting, hematemesis, hematochezia, dysuria or rash.  The illness is also significant for chills. The illness does not include anorexia, constipation or back pain. Significant associated medical issues include GERD. Associated medical issues do not include gallstones.  Pt has a h/o GERD, MI, HTN, hyperlipidemia, CVA, abdominal hysterectomy, and esophagogastroduodenoscopy.  Dr. Jonette Eva is the pt's gastroenterologist.   Past Medical History  Diagnosis Date  . Myocardial infarct     1997, 2004  . Hypertension   . Hyperlipidemia   . CVA (cerebral infarction)   . Severe major depression with psychotic features     2004  . Anxiety   . Blind     right  . GERD (gastroesophageal reflux disease)     Past Surgical History  Procedure Date  . Abdominal hysterectomy   . Lumps removed from each breast   . Bilateral salpingoophorectomy   . Coronary angioplasty with stent placement 4098,[id number]  . Esophagogastroduodenoscopy 09/05/2004    Dr Jimmye Norman gastritis, candida esophagitis  . Cyst removed from left wrist   . Esophagogastroduodenoscopy 09/07/2011    Procedure: ESOPHAGOGASTRODUODENOSCOPY (EGD);  Surgeon: Arlyce Harman, MD;  Location: [facility];  Service: Endoscopy;  Laterality: N/A;    Family History  Problem Relation Age of Onset  . Multiple sclerosis Father   . Alzheimer's disease Mother     History  Substance Use Topics  . Smoking status: Former Smoker -- 0.2 packs/day for 40 years    Types: Cigarettes  . Smokeless tobacco: Never Used  . Alcohol Use: No    OB History    Grav Para Term Preterm Abortions TAB SAB Ect Mult Living  Review of Systems  Constitutional: Positive for chills and diaphoresis. Negative for fever and fatigue.  HENT: Negative for congestion, sinus pressure and ear discharge.   Eyes: Negative for discharge.  Respiratory: Negative for cough.     Cardiovascular: Negative for chest pain.  Gastrointestinal: Positive for nausea, abdominal pain and diarrhea. Negative for vomiting, constipation, hematochezia, anorexia and hematemesis.  Genitourinary: Negative for dysuria, urgency, frequency, hematuria and vaginal bleeding.  Musculoskeletal: Negative for back pain.  Skin: Negative for rash.  Neurological: Negative for seizures and headaches.  Hematological: Negative.   Psychiatric/Behavioral: Negative for hallucinations.  All other systems reviewed and are negative.    Allergies  Penicillins; Aspirin; Bee venom; Contrast media; Dilaudid; Lisinopril; and Motrin  Home Medications   Current Outpatient Rx  Name  Route  Sig  Dispense  Refill  . ALBUTEROL SULFATE HFA 108 (90 BASE) MCG/ACT IN AERS   Inhalation   Inhale 2 puffs into the lungs every 6 (six) hours as needed.          Marland Kitchen AMLODIPINE BESYLATE 10 MG PO TABS   Oral   Take 10 mg by mouth daily.          . ASPIRIN EC 81 MG PO TBEC   Oral   Take 81 mg by mouth daily.           Marland Kitchen CLONIDINE HCL 0.3 MG PO TABS   Oral   Take 0.3 mg by mouth 2 (two) times daily.           Marland Kitchen DOCUSATE SODIUM 100 MG PO CAPS   Oral   Take 100 mg by mouth daily.           Marland Kitchen METOPROLOL TARTRATE 50 MG PO TABS   Oral   Take 50 mg by mouth 2 (two) times daily.          Carma Leaven M PLUS PO TABS   Oral   Take 1 tablet by mouth daily.           Marland Kitchen OMEPRAZOLE 20 MG PO CPDR   Oral   Take 1 capsule (20 mg total) by mouth daily.   31 capsule   11   . SIMVASTATIN 40 MG PO TABS   Oral   Take 40 mg by mouth daily.             Triage Vitals: BP 159/70  Pulse 98  Temp 98.2 F (36.8 C) (Oral)  Resp 20  Ht 5' (1.524 m)  Wt 145 lb (65.772 kg)  BMI 28.32 kg/m2  SpO2 96%  Physical Exam  Nursing note and vitals reviewed. Constitutional: She is oriented to person, place, and time. She appears well-developed.  HENT:  Head: Normocephalic and atraumatic.       Dry mucus membranes.   Eyes: Conjunctivae normal and EOM are normal. No scleral icterus.  Neck: Neck supple. No thyromegaly present.  Cardiovascular: Normal rate and regular rhythm.  Exam reveals no gallop and no friction rub.   No murmur heard. Pulmonary/Chest: No stridor. She has no wheezes. She has no rales. She exhibits no tenderness.  Abdominal: Soft. She exhibits no distension. There is tenderness. There is no rebound.       Moderate tenderness throughout abdomen.  Musculoskeletal: Normal range of motion. She exhibits no edema.  Lymphadenopathy:    She has no cervical adenopathy.  Neurological: She is oriented to person, place, and time. Coordination normal.  Skin: No rash noted. No erythema.  Psychiatric: She  has a normal mood and affect. Her behavior is normal.    ED Course  Procedures (including critical care time) DIAGNOSTIC STUDIES: Oxygen Saturation is 96% on room air, adequate by my interpretation.    COORDINATION OF CARE: 16:00--I evaluated the patient and we discussed a treatment plan including blood work, urinalysis, pain medication, and nausea medication to which the pt agreed.   17:21--I rechecked the pt who is feeling a lot better. She agreed to go home with medication and check up with Dr. Darrick Penna or Dr. Felecia Shelling if her symptoms persist.  Results for orders placed during the hospital encounter of 10/03/12  CBC WITH DIFFERENTIAL      Component Value Range   WBC 12.2 (*) 4.0 - 10.5 K/uL   RBC 4.18  3.87 - 5.11 MIL/uL   Hemoglobin 13.6  12.0 - 15.0 g/dL   HCT 16.1  09.6 - 04.5 %   MCV 96.2  78.0 - 100.0 fL   MCH 32.5  26.0 - 34.0 pg   MCHC 33.8  30.0 - 36.0 g/dL   RDW 40.9  81.1 - 91.4 %   Platelets 305  150 - 400 K/uL   Neutrophils Relative 77  43 - 77 %   Neutro Abs 9.4 (*) 1.7 - 7.7 K/uL   Lymphocytes Relative 19  12 - 46 %   Lymphs Abs 2.4  0.7 - 4.0 K/uL   Monocytes Relative 4  3 - 12 %   Monocytes Absolute 0.5  0.1 - 1.0 K/uL   Eosinophils Relative 0  0 - 5 %   Eosinophils  Absolute 0.0  0.0 - 0.7 K/uL   Basophils Relative 0  0 - 1 %   Basophils Absolute 0.0  0.0 - 0.1 K/uL  COMPREHENSIVE METABOLIC PANEL      Component Value Range   Sodium 138  135 - 145 mEq/L   Potassium 3.7  3.5 - 5.1 mEq/L   Chloride 99  96 - 112 mEq/L   CO2 28  19 - 32 mEq/L   Glucose, Bld 136 (*) 70 - 99 mg/dL   BUN 7  6 - 23 mg/dL   Creatinine, Ser 7.82  0.50 - 1.10 mg/dL   Calcium 9.8  8.4 - 95.6 mg/dL   Total Protein 8.5 (*) 6.0 - 8.3 g/dL   Albumin 4.2  3.5 - 5.2 g/dL   AST 23  0 - 37 U/L   ALT 18  0 - 35 U/L   Alkaline Phosphatase 142 (*) 39 - 117 U/L   Total Bilirubin 0.5  0.3 - 1.2 mg/dL   GFR calc non Af Amer 89 (*) >90 mL/min   GFR calc Af Amer >90  >90 mL/min  URINALYSIS, ROUTINE W REFLEX MICROSCOPIC      Component Value Range   Color, Urine YELLOW  YELLOW   APPearance CLEAR  CLEAR   Specific Gravity, Urine 1.020  1.005 - 1.030   pH 6.0  5.0 - 8.0   Glucose, UA NEGATIVE  NEGATIVE mg/dL   Hgb urine dipstick NEGATIVE  NEGATIVE   Bilirubin Urine NEGATIVE  NEGATIVE   Ketones, ur NEGATIVE  NEGATIVE mg/dL   Protein, ur 30 (*) NEGATIVE mg/dL   Urobilinogen, UA 0.2  0.0 - 1.0 mg/dL   Nitrite NEGATIVE  NEGATIVE   Leukocytes, UA NEGATIVE  NEGATIVE  LIPASE, BLOOD      Component Value Range   Lipase 20  11 - 59 U/L  URINE MICROSCOPIC-ADD ON  Component Value Range   Squamous Epithelial / LPF MANY (*) RARE   WBC, UA 0-2  <3 WBC/hpf   RBC / HPF 0-2  <3 RBC/hpf   Dg Abd Acute W/chest  10/03/2012  *RADIOLOGY REPORT*  Clinical Data: Abdominal pain, diarrhea, history of hemorrhoids  ACUTE ABDOMEN SERIES (ABDOMEN 2 VIEW & CHEST 1 VIEW)  Comparison: CT abdomen pelvis - 09/07/2011; chest radiograph - 04/19/2007  Findings:  Unchanged cardiac silhouette and mediastinal contours.  No focal parenchymal opacity.  No pleural effusion or pneumothorax.  Nonobstructive bowel gas pattern.  No pneumoperitoneum, pneumatosis or portal venous gas.  Vascular calcifications.  A surgical clip  overlies the right sacral ala.  No acute osseous abnormality.  IMPRESSION: 1.  No acute cardiopulmonary disease. 2.  Nonobstructive bowel gas pattern.   Original Report Authenticated By: Tacey Ruiz, MD       No diagnosis found.    MDM        The chart was scribed for me under my direct supervision.  I personally performed the history, physical, and medical decision making and all procedures in the evaluation of this patient.April Lennert, MD 10/03/12 [phone]

## 2012-10-03 NOTE — Telephone Encounter (Signed)
Called and informed pt.  

## 2012-10-03 NOTE — ED Notes (Signed)
Pt states generalized abdominal pain, nausea and diarrhea began this morning at 0500. Denies vomiting.

## 2012-10-03 NOTE — Telephone Encounter (Signed)
PLEASE CALL PT.  SHE SHOULD GO TO THE ED FOR EVALUATION.

## 2012-10-03 NOTE — Telephone Encounter (Signed)
Called pt. She started haviing severe abdominal pain this AM about 5:00 Am. It has been constant. She rated it a 12 on a scaled of 1-10 and I told her she needs to go to the ED. She wants me to let Dr. Darrick Penna know and see what she advises!She was haviing normal BM's and it changed to diarrhea. She has had sweats and chills but has not taken her temperature. Please advise!

## 2012-10-03 NOTE — ED Notes (Signed)
Patient advised she was unable to provide urine specimen at this time.

## 2012-10-03 NOTE — Telephone Encounter (Signed)
Sent pager message to Dr. Darrick Penna also.

## 2012-10-03 NOTE — Telephone Encounter (Signed)
Patient called C/O severe abdominal pain, and she would like someone to return her call, she wanted to speak to Dr. Darrick Penna today Patient has not been seen in the office in over a year.

## 2012-10-04 ENCOUNTER — Ambulatory Visit: Payer: Medicare Other | Admitting: Family Medicine

## 2012-10-11 ENCOUNTER — Ambulatory Visit: Payer: Medicare Other | Admitting: Family Medicine

## 2012-10-25 ENCOUNTER — Ambulatory Visit (INDEPENDENT_AMBULATORY_CARE_PROVIDER_SITE_OTHER): Payer: Medicare Other | Admitting: Family Medicine

## 2012-10-25 ENCOUNTER — Encounter: Payer: Self-pay | Admitting: Family Medicine

## 2012-10-25 VITALS — BP 120/72 | HR 82 | Resp 16 | Ht 60.0 in | Wt 134.1 lb

## 2012-10-25 DIAGNOSIS — M549 Dorsalgia, unspecified: Secondary | ICD-10-CM

## 2012-10-25 DIAGNOSIS — I10 Essential (primary) hypertension: Secondary | ICD-10-CM

## 2012-10-25 DIAGNOSIS — K2970 Gastritis, unspecified, without bleeding: Secondary | ICD-10-CM

## 2012-10-25 DIAGNOSIS — K2990 Gastroduodenitis, unspecified, without bleeding: Secondary | ICD-10-CM

## 2012-10-25 DIAGNOSIS — E785 Hyperlipidemia, unspecified: Secondary | ICD-10-CM

## 2012-10-25 DIAGNOSIS — Z8673 Personal history of transient ischemic attack (TIA), and cerebral infarction without residual deficits: Secondary | ICD-10-CM

## 2012-10-25 DIAGNOSIS — G8929 Other chronic pain: Secondary | ICD-10-CM

## 2012-10-25 DIAGNOSIS — I2510 Atherosclerotic heart disease of native coronary artery without angina pectoris: Secondary | ICD-10-CM

## 2012-10-25 MED ORDER — NITROGLYCERIN 0.4 MG SL SUBL: 0.4000 mg | SUBLINGUAL_TABLET | SUBLINGUAL | Status: DC | PRN

## 2012-10-25 MED ORDER — METOPROLOL TARTRATE 50 MG PO TABS: 50.0000 mg | ORAL_TABLET | Freq: Two times a day (BID) | ORAL | Status: DC

## 2012-10-25 MED ORDER — OMEPRAZOLE 20 MG PO CPDR: 20.0000 mg | DELAYED_RELEASE_CAPSULE | Freq: Every day | ORAL | Status: DC

## 2012-10-25 MED ORDER — SIMVASTATIN 40 MG PO TABS: 40.0000 mg | ORAL_TABLET | Freq: Every day | ORAL | Status: DC

## 2012-10-25 MED ORDER — AMLODIPINE BESYLATE 10 MG PO TABS: 10.0000 mg | ORAL_TABLET | Freq: Every day | ORAL | Status: DC

## 2012-10-25 MED ORDER — CLONIDINE HCL 0.3 MG PO TABS: 0.3000 mg | ORAL_TABLET | Freq: Two times a day (BID) | ORAL | Status: DC

## 2012-10-25 NOTE — Patient Instructions (Signed)
You can ask the pharmacy about the shingles shot I will get the records I have refilled your medications besides pain meds Try the ESTROVEN for hot flashes  Referral to heart doctor after I get records F/U 3 months

## 2012-10-25 NOTE — Progress Notes (Signed)
  Subjective:    Patient ID: [name], female    DOB: 10-29-1950, 61 y.o.   MRN: [id number]  HPI  Patient presents to establish care. Previous PCP Dr. Felecia Shelling. Was recently followed by [place] [facility] as well as [facility] Medications and history reviewed She's history of myocardial infarction x3 she had a stroke with her last heart attack in 2002 she has multiple stents in place she has not seen cardiology for the past 10 years she was on Plavix and aspirin however cannot afford the medication and stopped it somewhere around 2003 2004. She continues to have daily chest pain worse with exertion or anxiety. She does take nitroglycerin however this gives her a headache  Hypertension blood pressure has been well-controlled with medications.  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  Chronic back pain she's currently on hydrocodone but takes very rarely the bottle she had today was from 2012 she also has Flexeril Review of Systems   GEN- denies fatigue, fever, weight loss,weakness, recent illness HEENT- denies eye drainage, change in vision, nasal discharge, CVS- denies chest pain, palpitations RESP- denies SOB, cough, wheeze ABD- denies N/V, change in stools, abd pain GU- denies dysuria, hematuria, dribbling, incontinence MSK- denies joint pain, muscle aches, injury Neuro- denies headache, dizziness, syncope, seizure activity      Objective:   Physical Exam  GEN- NAD, alert and oriented x3 HEENT- PERRL, EOMI, non injected sclera, pink conjunctiva, MMM, oropharynx clear Neck- Supple, no thryomegaly CVS- RRR, no murmur RESP-CTAB ABD-NABS,soft,NT,ND  EXT- No edema Pulses- Radial, DP- 2+ Psych- crying at end of visit discussing home situation, not overly depressed or anxious appearing      Assessment & Plan:

## 2012-10-26 ENCOUNTER — Telehealth: Payer: Self-pay | Admitting: Internal Medicine

## 2012-10-26 ENCOUNTER — Encounter: Payer: Self-pay | Admitting: Gastroenterology

## 2012-10-26 DIAGNOSIS — G8929 Other chronic pain: Secondary | ICD-10-CM | POA: Insufficient documentation

## 2012-10-26 DIAGNOSIS — K2970 Gastritis, unspecified, without bleeding: Secondary | ICD-10-CM

## 2012-10-26 DIAGNOSIS — Z8673 Personal history of transient ischemic attack (TIA), and cerebral infarction without residual deficits: Secondary | ICD-10-CM | POA: Insufficient documentation

## 2012-10-26 DIAGNOSIS — M549 Dorsalgia, unspecified: Secondary | ICD-10-CM | POA: Insufficient documentation

## 2012-10-26 MED ORDER — OMEPRAZOLE 20 MG PO CPDR: 20.0000 mg | DELAYED_RELEASE_CAPSULE | Freq: Every day | ORAL | Status: DC

## 2012-10-26 MED ORDER — CLONIDINE HCL 0.3 MG PO TABS: 0.3000 mg | ORAL_TABLET | Freq: Two times a day (BID) | ORAL | Status: DC

## 2012-10-26 MED ORDER — SIMVASTATIN 40 MG PO TABS: 40.0000 mg | ORAL_TABLET | Freq: Every day | ORAL | Status: DC

## 2012-10-26 MED ORDER — AMLODIPINE BESYLATE 10 MG PO TABS: 10.0000 mg | ORAL_TABLET | Freq: Every day | ORAL | Status: DC

## 2012-10-26 MED ORDER — NITROGLYCERIN 0.4 MG SL SUBL: 0.4000 mg | SUBLINGUAL_TABLET | SUBLINGUAL | Status: DC | PRN

## 2012-10-26 MED ORDER — METOPROLOL TARTRATE 50 MG PO TABS: 50.0000 mg | ORAL_TABLET | Freq: Two times a day (BID) | ORAL | Status: DC

## 2012-10-26 NOTE — Assessment & Plan Note (Signed)
On statin therapy 

## 2012-10-26 NOTE — Assessment & Plan Note (Signed)
Residual blindness in right eye, 2002

## 2012-10-26 NOTE — Assessment & Plan Note (Signed)
Well controlled 

## 2012-10-26 NOTE — Assessment & Plan Note (Addendum)
She has anginal episodes uses NTG at times, she does not want to see cards because of fiances, records to be obtained, she will need to be seen again and this was recommended to her, she is not on plavix due to finances,  She is holding off at this time

## 2012-10-26 NOTE — Telephone Encounter (Signed)
Resent in again to [facility]

## 2012-10-26 NOTE — Assessment & Plan Note (Signed)
She has old bottle of vicodin, discussed no narcotics on first visit from my office, records to be obtained

## 2012-10-27 ENCOUNTER — Ambulatory Visit (INDEPENDENT_AMBULATORY_CARE_PROVIDER_SITE_OTHER): Payer: Medicare Other | Admitting: Gastroenterology

## 2012-10-27 ENCOUNTER — Encounter: Payer: Self-pay | Admitting: Gastroenterology

## 2012-10-27 VITALS — BP 122/69 | HR 92 | Temp 98.0°F | Ht 60.0 in | Wt 134.8 lb

## 2012-10-27 DIAGNOSIS — K5289 Other specified noninfective gastroenteritis and colitis: Secondary | ICD-10-CM

## 2012-10-27 DIAGNOSIS — K5909 Other constipation: Secondary | ICD-10-CM

## 2012-10-27 DIAGNOSIS — K529 Noninfective gastroenteritis and colitis, unspecified: Secondary | ICD-10-CM

## 2012-10-27 DIAGNOSIS — K5904 Chronic idiopathic constipation: Secondary | ICD-10-CM

## 2012-10-27 DIAGNOSIS — Z1211 Encounter for screening for malignant neoplasm of colon: Secondary | ICD-10-CM | POA: Insufficient documentation

## 2012-10-27 DIAGNOSIS — K2970 Gastritis, unspecified, without bleeding: Secondary | ICD-10-CM

## 2012-10-27 NOTE — Assessment & Plan Note (Signed)
LIKELY DUE TO IBS IF sX EXACERBATED BY STRESS AND ASSOCIATED WITH SEVERE ABD PAIN.  PT SHOULD SEE DR. Jeanice Lim TO DISCUSS PAIN MEDS. CONTINUE COLACE AND MIRALAX. HIGH FIBER DIET OPV IN 12 MOS

## 2012-10-27 NOTE — Patient Instructions (Signed)
CONTINUE COLACE AND MIRALAX TO TREAT CONSTIPATION.  FOLLOW A HIGH FIBER DIET. SEE INFO BELOW.  CALL ME WHEN YOU ARE READY TO HAVE A COLONOSCOPY.  FOLLOW UP IN 12 MOS.  High-Fiber Diet A high-fiber diet changes your normal diet to include more whole grains, legumes, fruits, and vegetables. Changes in the diet involve replacing refined carbohydrates with unrefined foods. The calorie level of the diet is essentially unchanged. The Dietary Reference Intake (recommended amount) for adult males is 38 grams per day. For adult females, it is 25 grams per day. Pregnant and lactating women should consume 28 grams of fiber per day. Fiber is the intact part of a plant that is not broken down during digestion. Functional fiber is fiber that has been isolated from the plant to provide a beneficial effect in the body. PURPOSE  Increase stool bulk.   Ease and regulate bowel movements.   Lower cholesterol.  INDICATIONS THAT YOU NEED MORE FIBER  Constipation and hemorrhoids.   Uncomplicated diverticulosis (intestine condition) and irritable bowel syndrome.   Weight management.   As a protective measure against hardening of the arteries (atherosclerosis), diabetes, and cancer.   GUIDELINES FOR INCREASING FIBER IN THE DIET  Start adding fiber to the diet slowly. A gradual increase of about 5 more grams (2 slices of whole-wheat bread, 2 servings of most fruits or vegetables, or 1 bowl of high-fiber cereal) per day is best. Too rapid an increase in fiber may result in constipation, flatulence, and bloating.   Drink enough water and fluids to keep your urine clear or pale yellow. Water, juice, or caffeine-free drinks are recommended. Not drinking enough fluid may cause constipation.   Eat a variety of high-fiber foods rather than one type of fiber.   Try to increase your intake of fiber through using high-fiber foods rather than fiber pills or supplements that contain small amounts of fiber.   The  goal is to change the types of food eaten. Do not supplement your present diet with high-fiber foods, but replace foods in your present diet.  INCLUDE A VARIETY OF FIBER SOURCES  Replace refined and processed grains with whole grains, canned fruits with fresh fruits, and incorporate other fiber sources. White rice, white breads, and most bakery goods contain little or no fiber.   Brown whole-grain rice, buckwheat oats, and many fruits and vegetables are all good sources of fiber. These include: broccoli, Brussels sprouts, cabbage, cauliflower, beets, sweet potatoes, white potatoes (skin on), carrots, tomatoes, eggplant, squash, berries, fresh fruits, and dried fruits.   Cereals appear to be the richest source of fiber. Cereal fiber is found in whole grains and bran. Bran is the fiber-rich outer coat of cereal grain, which is largely removed in refining. In whole-grain cereals, the bran remains. In breakfast cereals, the largest amount of fiber is found in those with "bran" in their names. The fiber content is sometimes indicated on the label.   You may need to include additional fruits and vegetables each day.   In baking, for 1 cup white flour, you may use the following substitutions:   1 cup whole-wheat flour minus 2 tablespoons.   1/2 cup white flour plus 1/2 cup whole-wheat flour.

## 2012-10-27 NOTE — Progress Notes (Signed)
Faxed to PCP

## 2012-10-27 NOTE — Assessment & Plan Note (Signed)
SX CONTROLLED.  CONTINUE OMEPRAZOLE ONCE DAILY FOREVER. OPV IN 12 MOS.

## 2012-10-27 NOTE — Assessment & Plan Note (Signed)
NO RECURRENT SX.

## 2012-10-27 NOTE — Progress Notes (Signed)
Subjective:    Patient ID: [name], female    DOB: 02-Jan-1951, 62 y.o.   MRN: [id number]  PCP: [place]  HPI STAYS COLD A LOT. WANTS RX FOR PAIN MEDS FOR HER ABD PAIN. STOMACH NOT REAL SERIOUS BOUTS SINCE 2012. HAD 5 SOLID STOOLS AND GOT RUNNY AND PASSING SOME BLOOD. WAITING TO GET INSURANCE. SEEN IN ED AND NOW HAS A $2K BILL FROM ED. NO MORE RECTAL BLEEDING. CIGS: 1 PK >QMO. GETS STRESSED OUT A WHOLE LOT. HAD NAUSEA AND SEEN IN ED, BUT NO VOMITING. JAN 13 WHEN SHE WAS GOING TO DO JURY DUTY. IF SHE EATS FIBER SHE DOESN'T NEED THE MIRALAX. HAS CONSTIPATION-NEVER HAD BM DAILY. MAY GO 2-3 WKS W/O BM.  PT DENIES FEVER, CHILLS, BRBPR, melena, diarrhea, problems swallowing, heartburn or indigestion.  Past Medical History  Diagnosis Date  . Myocardial infarct     1997, 2004  . Hypertension   . Hyperlipidemia   . CVA (cerebral infarction)   . Severe major depression with psychotic features     2004  . Anxiety   . Blind     right  . GERD (gastroesophageal reflux disease)   . Stroke 2002   Past Surgical History  Procedure Date  . Abdominal hysterectomy   . Lumps removed from each breast   . Bilateral salpingoophorectomy   . Coronary angioplasty with stent placement 7829,5621  . Esophagogastroduodenoscopy 09/05/2004    Dr Jimmye Norman gastritis, candida esophagitis  . Cyst removed from left wrist   . Esophagogastroduodenoscopy 09/07/2011    HYQ:MVHQ, esophageal in the distal esophagus/Mild gastritis   Allergies  Allergen Reactions  . Penicillins Anaphylaxis  . Aspirin Swelling and Other (See Comments)    Only in large doses will cause a reaction  . Bee Venom Swelling    Severe swelling  . Contrast Media (Iodinated Diagnostic Agents) Swelling  . Dilaudid (Hydromorphone Hcl) Itching  . Lisinopril Swelling  . Motrin (Ibuprofen) Swelling   Current Outpatient Prescriptions  Medication Sig Dispense Refill  . albuterol (PROVENTIL HFA;VENTOLIN HFA) 108 (90 BASE) MCG/ACT  inhaler Inhale 2 puffs into the lungs every 6 (six) hours as needed.       Marland Kitchen amLODipine (NORVASC) 10 MG tablet Take 1 tablet (10 mg total) by mouth daily.    Marland Kitchen aspirin EC 81 MG tablet Take 81 mg by mouth daily.      . cloNIDine (CATAPRES) 0.3 MG tablet Take 1 tablet (0.3 mg total) by mouth 2 (two) times daily.    . cyclobenzaprine (FLEXERIL) 5 MG tablet Take 5 mg by mouth 3 (three) times daily as needed.    . docusate sodium (COLACE) 100 MG capsule Take 100 mg by mouth daily.      . metoprolol (LOPRESSOR) 50 MG tablet Take 1 tablet (50 mg total) by mouth 2 (two) times daily.    . Multiple Vitamins-Minerals (MULTIVITAMINS THER. W/MINERALS) TABS Take 1 tablet by mouth daily.      . nitroGLYCERIN (NITROSTAT) 0.4 MG SL tablet Place 1 tablet (0.4 mg total) under the tongue every 5 (five) minutes as needed for chest pain.    Marland Kitchen omeprazole (PRILOSEC) 20 MG capsule Take 1 capsule (20 mg total) by mouth daily.    . polyethylene glycol powder (GLYCOLAX/MIRALAX) powder Take 17 g by mouth daily. ON MOST DAYS   . promethazine (PHENERGAN) 25 MG tablet Take 1 tablet (25 mg total) by mouth every 6 (six) hours as needed for nausea. LAST DOSE JAN 2013   .  simvastatin (ZOCOR) 40 MG tablet Take 1 tablet (40 mg total) by mouth daily.    . traMADol (ULTRAM) 50 MG tablet Take 1 tablet (50 mg total) by mouth every 6 (six) hours as needed for pain.    .             Review of Systems     Objective:   Physical Exam  Vitals reviewed. Constitutional: She is oriented to person, place, and time. She appears well-nourished. No distress.  HENT:  Head: Normocephalic and atraumatic.  Mouth/Throat: No oropharyngeal exudate.  Eyes: Pupils are equal, round, and reactive to light. No scleral icterus.  Cardiovascular: Normal rate, regular rhythm and normal heart sounds.   Pulmonary/Chest: Effort normal and breath sounds normal. No respiratory distress.  Abdominal: Soft. Bowel sounds are normal. She exhibits no distension.  There is no tenderness.  Musculoskeletal: She exhibits no edema.  Neurological: She is alert and oriented to person, place, and time.       NO FOCAL DEFICITS   Psychiatric: She has a normal mood and affect.          Assessment & Plan:

## 2012-10-27 NOTE — Assessment & Plan Note (Signed)
PT UNABLE TO COMPLETE TCS IN 2013 DUE TO FINANCES. NOW HAS A BILL SHE NEEDS TO PAY.  EXPLAINED TO PT SHE NEEDS TO HAVE A TCS.PT WILL CALL WHEN SHE IS READY TO HAVE A TCS. OPV IN 12 MOS.

## 2012-11-11 ENCOUNTER — Encounter: Payer: Self-pay | Admitting: Family Medicine

## 2012-11-21 NOTE — Progress Notes (Signed)
Reminder in epic to follow up in one year °

## 2012-12-23 ENCOUNTER — Ambulatory Visit: Payer: Medicare Other | Admitting: Family Medicine

## 2013-01-09 ENCOUNTER — Ambulatory Visit: Payer: Medicare Other | Admitting: Family Medicine

## 2013-01-24 ENCOUNTER — Ambulatory Visit: Payer: Medicare Other | Admitting: Family Medicine

## 2013-02-19 ENCOUNTER — Other Ambulatory Visit: Payer: Self-pay | Admitting: Family Medicine

## 2013-02-20 NOTE — Telephone Encounter (Signed)
Med rf °

## 2013-02-20 NOTE — Telephone Encounter (Signed)
Forwarding to new office  

## 2013-02-23 ENCOUNTER — Ambulatory Visit: Payer: Medicare Other | Admitting: Family Medicine

## 2013-03-21 ENCOUNTER — Other Ambulatory Visit: Payer: Self-pay | Admitting: Family Medicine

## 2013-03-21 ENCOUNTER — Telehealth: Payer: Self-pay

## 2013-03-21 NOTE — Telephone Encounter (Signed)
Pt called and said that Dr. Darrick Penna was supposed to send her refills to [facility] for a year for Omeprazole when she was here in Feb. It was not sent. Dr. Jeanice Lim gave her an Rx 02/19/2013 with 2 refills, but she is no longer in [place]. She is just wanting to make sure she has refills until Feb because she was told to return in one year. Please advise!

## 2013-03-21 NOTE — Telephone Encounter (Signed)
Med refilled.

## 2013-03-22 MED ORDER — OMEPRAZOLE 20 MG PO CPDR: DELAYED_RELEASE_CAPSULE | ORAL | Status: DC

## 2013-03-22 NOTE — Telephone Encounter (Signed)
PLEASE CALL PT. Her rx was sent electronically IN FEB 2014. I WILL RESEND IT AGAIN WITH REFILLS UNTIL FEB 2015.

## 2013-03-22 NOTE — Telephone Encounter (Signed)
Called and informed pt.  

## 2013-08-18 ENCOUNTER — Other Ambulatory Visit: Payer: Self-pay | Admitting: Family Medicine

## 2013-08-18 ENCOUNTER — Encounter: Payer: Self-pay | Admitting: Family Medicine

## 2013-08-18 NOTE — Telephone Encounter (Signed)
Medication refill for one time only.  Patient needs to be seen.  Letter sent for patient to call and schedule 

## 2014-07-15 ENCOUNTER — Emergency Department (HOSPITAL_COMMUNITY): Payer: Medicare Other

## 2014-07-15 ENCOUNTER — Encounter (HOSPITAL_COMMUNITY): Payer: Self-pay | Admitting: Emergency Medicine

## 2014-07-15 ENCOUNTER — Inpatient Hospital Stay (HOSPITAL_COMMUNITY)
Admission: EM | Admit: 2014-07-15 | Discharge: 2014-07-18 | DRG: 392 | Disposition: A | Discharge status: Home / Self Care | Payer: Medicare Other | Attending: Internal Medicine | Admitting: Internal Medicine

## 2014-07-15 DIAGNOSIS — R1030 Lower abdominal pain, unspecified: Secondary | ICD-10-CM

## 2014-07-15 DIAGNOSIS — Q438 Other specified congenital malformations of intestine: Secondary | ICD-10-CM

## 2014-07-15 DIAGNOSIS — H5441 Blindness, right eye, normal vision left eye: Secondary | ICD-10-CM | POA: Diagnosis present

## 2014-07-15 DIAGNOSIS — H540 Blindness, both eyes: Secondary | ICD-10-CM | POA: Diagnosis present

## 2014-07-15 DIAGNOSIS — K5901 Slow transit constipation: Secondary | ICD-10-CM

## 2014-07-15 DIAGNOSIS — K5730 Diverticulosis of large intestine without perforation or abscess without bleeding: Principal | ICD-10-CM

## 2014-07-15 DIAGNOSIS — Z66 Do not resuscitate: Secondary | ICD-10-CM | POA: Diagnosis present

## 2014-07-15 DIAGNOSIS — Z79899 Other long term (current) drug therapy: Secondary | ICD-10-CM

## 2014-07-15 DIAGNOSIS — Z820 Family history of epilepsy and other diseases of the nervous system: Secondary | ICD-10-CM

## 2014-07-15 DIAGNOSIS — K5909 Other constipation: Secondary | ICD-10-CM

## 2014-07-15 DIAGNOSIS — K219 Gastro-esophageal reflux disease without esophagitis: Secondary | ICD-10-CM | POA: Diagnosis present

## 2014-07-15 DIAGNOSIS — J449 Chronic obstructive pulmonary disease, unspecified: Secondary | ICD-10-CM | POA: Diagnosis present

## 2014-07-15 DIAGNOSIS — K5660 Unspecified intestinal obstruction: Secondary | ICD-10-CM | POA: Diagnosis present

## 2014-07-15 DIAGNOSIS — R1084 Generalized abdominal pain: Secondary | ICD-10-CM

## 2014-07-15 DIAGNOSIS — Z8673 Personal history of transient ischemic attack (TIA), and cerebral infarction without residual deficits: Secondary | ICD-10-CM

## 2014-07-15 DIAGNOSIS — Z833 Family history of diabetes mellitus: Secondary | ICD-10-CM

## 2014-07-15 DIAGNOSIS — E785 Hyperlipidemia, unspecified: Secondary | ICD-10-CM | POA: Diagnosis present

## 2014-07-15 DIAGNOSIS — K529 Noninfective gastroenteritis and colitis, unspecified: Secondary | ICD-10-CM

## 2014-07-15 DIAGNOSIS — R109 Unspecified abdominal pain: Secondary | ICD-10-CM | POA: Diagnosis not present

## 2014-07-15 DIAGNOSIS — Z955 Presence of coronary angioplasty implant and graft: Secondary | ICD-10-CM

## 2014-07-15 DIAGNOSIS — K5900 Constipation, unspecified: Secondary | ICD-10-CM

## 2014-07-15 DIAGNOSIS — F17210 Nicotine dependence, cigarettes, uncomplicated: Secondary | ICD-10-CM | POA: Diagnosis present

## 2014-07-15 DIAGNOSIS — K56609 Unspecified intestinal obstruction, unspecified as to partial versus complete obstruction: Secondary | ICD-10-CM

## 2014-07-15 DIAGNOSIS — R933 Abnormal findings on diagnostic imaging of other parts of digestive tract: Secondary | ICD-10-CM

## 2014-07-15 DIAGNOSIS — I2510 Atherosclerotic heart disease of native coronary artery without angina pectoris: Secondary | ICD-10-CM

## 2014-07-15 DIAGNOSIS — Z7982 Long term (current) use of aspirin: Secondary | ICD-10-CM

## 2014-07-15 DIAGNOSIS — I10 Essential (primary) hypertension: Secondary | ICD-10-CM

## 2014-07-15 DIAGNOSIS — I252 Old myocardial infarction: Secondary | ICD-10-CM

## 2014-07-15 DIAGNOSIS — Z8249 Family history of ischemic heart disease and other diseases of the circulatory system: Secondary | ICD-10-CM

## 2014-07-15 LAB — CBC WITH DIFFERENTIAL/PLATELET
Basophils Absolute: 0 10*3/uL (ref 0.0–0.1)
Basophils Relative: 0 % (ref 0–1)
Eosinophils Absolute: 0.1 10*3/uL (ref 0.0–0.7)
Eosinophils Relative: 1 % (ref 0–5)
HCT: 41.8 % (ref 36.0–46.0)
Hemoglobin: 14 g/dL (ref 12.0–15.0)
LYMPHS ABS: 2.4 10*3/uL (ref 0.7–4.0)
Lymphocytes Relative: 24 % (ref 12–46)
MCH: 33.1 pg (ref 26.0–34.0)
MCHC: 33.5 g/dL (ref 30.0–36.0)
MCV: 98.8 fL (ref 78.0–100.0)
Monocytes Absolute: 0.8 10*3/uL (ref 0.1–1.0)
Monocytes Relative: 8 % (ref 3–12)
NEUTROS ABS: 7 10*3/uL (ref 1.7–7.7)
NEUTROS PCT: 67 % (ref 43–77)
PLATELETS: 265 10*3/uL (ref 150–400)
RBC: 4.23 MIL/uL (ref 3.87–5.11)
RDW: 12.8 % (ref 11.5–15.5)
WBC: 10.3 10*3/uL (ref 4.0–10.5)

## 2014-07-15 LAB — POC OCCULT BLOOD, ED: Fecal Occult Bld: POSITIVE — AB

## 2014-07-15 NOTE — ED Provider Notes (Signed)
CSN: [id number]     Arrival date & time 07/15/14  2130 History   First MD Initiated Contact with Patient 07/15/14 2234     No chief complaint on file.    (Consider location/radiation/quality/duration/timing/severity/associated sxs/prior Treatment) Patient is a 63 y.o. female presenting with abdominal pain. The history is provided by the patient.  Abdominal Pain Pain location:  Generalized Pain quality: cramping and fullness   Pain severity:  Moderate Onset quality:  Gradual Duration:  1 week Timing:  Constant Progression:  Worsening Chronicity:  New Relieved by:  Nothing Worsened by:  Nothing tried Ineffective treatments: stool softners. Associated symptoms: constipation and cough   Associated symptoms: no chest pain, no chills, no dysuria, no fever, no flatus, no nausea, no vaginal bleeding, no vaginal discharge and no vomiting    [name] is a 63 y.o. female who presents to the ED with abdominal pain. She states that she has been taking OTC medications for constipation because she has been taking OTC medication for congestion and they usually cause constipation. Her last BM was over a week ago. She started the medication for congestion 2 weeks ago but has not taken in a week.   Past Medical History  Diagnosis Date  . Myocardial infarct     1997, 2004  . Hypertension   . Hyperlipidemia   . CVA (cerebral infarction)   . Severe major depression with psychotic features     2004  . Anxiety   . Blind     right  . GERD (gastroesophageal reflux disease)   . Stroke 2002  . Colitis 09/07/2011   Past Surgical History  Procedure Laterality Date  . Abdominal hysterectomy    . Lumps removed from each breast    . Bilateral salpingoophorectomy    . Coronary angioplasty with stent placement  9532,0233  . Esophagogastroduodenoscopy  09/05/2004    Dr Jimmye Norman gastritis, candida esophagitis  . Cyst removed from left wrist    . Esophagogastroduodenoscopy  09/07/2011    IDH:WYSH, esophageal in the distal esophagus/Mild gastritis   Family History  Problem Relation Age of Onset  . Multiple sclerosis Father   . Alzheimer's disease Mother   . Hypertension Mother   . Diabetes Mother    History  Substance Use Topics  . Smoking status: Current Some Day Smoker -- 0.25 packs/day for 40 years    Types: Cigarettes  . Smokeless tobacco: Never Used  . Alcohol Use: No   OB History   Grav Para Term Preterm Abortions TAB SAB Ect Mult Living                 Review of Systems  Constitutional: Negative for fever and chills.  HENT: Negative.   Eyes: Negative for pain, redness and visual disturbance.       Blind in right eye  Respiratory: Positive for cough.   Cardiovascular: Negative for chest pain.  Gastrointestinal: Positive for abdominal pain and constipation. Negative for nausea, vomiting and flatus.  Genitourinary: Negative for dysuria, urgency, vaginal bleeding and vaginal discharge.  Musculoskeletal: Positive for back pain (chronic). Negative for neck pain.  Skin: Negative for rash.  Neurological: Negative for dizziness, syncope and headaches.  Psychiatric/Behavioral: Negative for confusion. The patient is not nervous/anxious.       Allergies  Penicillins; Aspirin; Bee venom; Contrast media; Dilaudid; Lisinopril; and Motrin  Home Medications   Prior to Admission medications   Medication Sig Start Date End Date Taking? Authorizing Provider  amLODipine (NORVASC) 10 MG  tablet Take 10 mg by mouth daily.   Yes Historical Provider, MD  aspirin EC 81 MG tablet Take 81 mg by mouth daily.     Yes Historical Provider, MD  cloNIDine (CATAPRES) 0.3 MG tablet Take 0.3 mg by mouth 2 (two) times daily.   Yes Historical Provider, MD  dextromethorphan-guaiFENesin (MUCINEX DM) 30-600 MG per 12 hr tablet Take 1 tablet by mouth 2 (two) times daily.   Yes Historical Provider, MD  docusate sodium (COLACE) 100 MG capsule Take 100 mg by mouth daily.     Yes Historical  Provider, MD  furosemide (LASIX) 20 MG tablet Take 20 mg by mouth daily.   Yes Historical Provider, MD  metoprolol (LOPRESSOR) 50 MG tablet Take 1 tablet (50 mg total) by mouth 2 (two) times daily. 10/26/12  Yes Salley Scarlet, MD  omeprazole (PRILOSEC) 20 MG capsule Take 20 mg by mouth daily. 03/22/13  Yes West Bali, MD  polyethylene glycol powder (GLYCOLAX/MIRALAX) powder Take 17 g by mouth daily.   Yes Historical Provider, MD  simvastatin (ZOCOR) 40 MG tablet Take 1 tablet (40 mg total) by mouth daily. 10/26/12  Yes Salley Scarlet, MD  traMADol (ULTRAM) 50 MG tablet Take 50 mg by mouth every 6 (six) hours as needed for moderate pain. 10/03/12  Yes Benny Lennert, MD   BP 151/90  Pulse 76  Temp(Src) 97.7 F (36.5 C) (Oral)  Resp 20  Ht 5' (1.524 m)  Wt 125 lb (56.7 kg)  BMI 24.41 kg/m2  SpO2 96% Physical Exam  Nursing note and vitals reviewed. Constitutional: She is oriented to person, place, and time. She appears well-developed and well-nourished. No distress.  Patient appears extremely uncomfortable. She is sitting in a chair rocking back and forth. She does not want to lie flat due to the pain.   HENT:  Head: Normocephalic and atraumatic.  Eyes: EOM are normal.  Neck: Neck supple.  Cardiovascular: Normal rate and regular rhythm.   Pulmonary/Chest: Effort normal and breath sounds normal.  Abdominal: Soft. Bowel sounds are increased. There is generalized tenderness. There is no CVA tenderness.  Increased pain RUQ and LLQ  Genitourinary: Rectal exam shows no external hemorrhoid, no internal hemorrhoid, no fissure and no mass. Guaiac positive stool.  Good tone, tiny amount of stool palpated in rectum.   Musculoskeletal: Normal range of motion.  Neurological: She is alert and oriented to person, place, and time. No cranial nerve deficit.  Skin: Skin is warm and dry.  Psychiatric: She has a normal mood and affect. Her behavior is normal.   Results for orders placed during the  hospital encounter of 07/15/14 (from the past 24 hour(s))  POC OCCULT BLOOD, ED     Status: Abnormal   Collection Time    07/15/14 11:05 PM      Result Value Ref Range   Fecal Occult Bld POSITIVE (*) NEGATIVE  CBC WITH DIFFERENTIAL     Status: None   Collection Time    07/15/14 11:26 PM      Result Value Ref Range   WBC 10.3  4.0 - 10.5 K/uL   RBC 4.23  3.87 - 5.11 MIL/uL   Hemoglobin 14.0  12.0 - 15.0 g/dL   HCT 91.4  78.2 - 95.6 %   MCV 98.8  78.0 - 100.0 fL   MCH 33.1  26.0 - 34.0 pg   MCHC 33.5  30.0 - 36.0 g/dL   RDW 21.3  08.6 - 57.8 %  Platelets 265  150 - 400 K/uL   Neutrophils Relative % 67  43 - 77 %   Neutro Abs 7.0  1.7 - 7.7 K/uL   Lymphocytes Relative 24  12 - 46 %   Lymphs Abs 2.4  0.7 - 4.0 K/uL   Monocytes Relative 8  3 - 12 %   Monocytes Absolute 0.8  0.1 - 1.0 K/uL   Eosinophils Relative 1  0 - 5 %   Eosinophils Absolute 0.1  0.0 - 0.7 K/uL   Basophils Relative 0  0 - 1 %   Basophils Absolute 0.0  0.0 - 0.1 K/uL  COMPREHENSIVE METABOLIC PANEL     Status: Abnormal   Collection Time    07/15/14 11:26 PM      Result Value Ref Range   Sodium 139  137 - 147 mEq/L   Potassium 4.2  3.7 - 5.3 mEq/L   Chloride 99  96 - 112 mEq/L   CO2 28  19 - 32 mEq/L   Glucose, Bld 133 (*) 70 - 99 mg/dL   BUN 10  6 - 23 mg/dL   Creatinine, Ser 1.610.85  0.50 - 1.10 mg/dL   Calcium 9.9  8.4 - 09.610.5 mg/dL   Total Protein 8.8 (*) 6.0 - 8.3 g/dL   Albumin 4.3  3.5 - 5.2 g/dL   AST 14  0 - 37 U/L   ALT 7  0 - 35 U/L   Alkaline Phosphatase 122 (*) 39 - 117 U/L   Total Bilirubin 0.6  0.3 - 1.2 mg/dL   GFR calc non Af Amer 70 (*) >90 mL/min   GFR calc Af Amer 82 (*) >90 mL/min   Anion gap 12  5 - 15    ED Course  Procedures Ct Abdomen Pelvis Wo Contrast  07/16/2014   CLINICAL DATA:  Constipation.  No bowel movement for 1 week.  EXAM: CT ABDOMEN AND PELVIS WITHOUT CONTRAST  TECHNIQUE: Multidetector CT imaging of the abdomen and pelvis was performed following the standard protocol  without IV contrast.  COMPARISON:  09/07/2011  FINDINGS: Lung bases are clear. Coronary artery calcifications. Normal heart size.  Organ evaluation is limited in the absence of intravenous contrast. Within this limitation, rounded hypodensity within the left hepatic lobe is most in keeping with a biliary cyst.  No radiodense gallstones or biliary ductal dilatation. No appreciable abnormality of the spleen, pancreas, adrenal glands. Upper pole left renal cyst measures water attenuation. Otherwise, symmetric renal attenuation. No urinary tract calculi. No hydroureteronephrosis.  Large stool burden. Rectum and distal sigmoid colon are decompressed. There is mild proximal perisigmoid colonic fat stranding and ill-defined fluid. Tiny locule of air on series 2, image 61 may be within a tiny diverticulum (though none seen here on the prior) or extraluminal.  Appendix not identified. No right lower quadrant inflammation. Small bowel loops are of normal caliber.  No lymphadenopathy.  Scattered atherosclerotic disease of the aorta and branch vessels without aneurysmal dilatation.  Thin walled bladder.  Absent uterus.  No adnexal mass.  No acute osseous finding.  L5-S1 degenerative disc disease.  IMPRESSION: Large proximal colonic stool burden, focal transition along the proximal sigmoid colon were there is pericolonic fat stranding and ill-defined fluid. This suggests a focal colitis (infectious, inflammatory, or ischemic) causing delayed transit, with underlying mass not excluded. A tiny adjacent locule of air (sagittal image 52) may be extraluminal.  Discussed via telephone with Dr. Bebe ShaggyWickline at 12:25 a.m. on 07/16/2014.   Electronically Signed  By: Jearld Lesch M.D.   On: 07/16/2014 00:30   Dg Abd Acute W/chest  07/15/2014   CLINICAL DATA:  Constipation.  Initial encounter  EXAM: ACUTE ABDOMEN SERIES (ABDOMEN 2 VIEW & CHEST 1 VIEW)  COMPARISON:  10/03/2012  FINDINGS: There is no evidence of bowel obstruction or  perforation. Stool volume is within normal limits. No concerning intra-abdominal mass effect or calcification.  Pulmonary hyperinflation with apical emphysematous changes. There is no edema, consolidation, effusion, or pneumothorax. Negative aortic contours. Normal heart size.  IMPRESSION: 1. No evidence of bowel obstruction or perforation. 2. COPD.   Electronically Signed   By: Tiburcio Pea M.D.   On: 07/15/2014 23:02    MDM  63 y.o. female with abdominal pain. Dr. Bebe Shaggy spoke with Dr. Lovell Sheehan, general surgery, and will admit the patient. Dr. Bebe Shaggy to discuss with Hospitalitis.        [facility] Orlene Och, [place] 07/16/14 [phone]

## 2014-07-15 NOTE — ED Notes (Signed)
Pt states she feels constipated and has taken miralax and a suppository without relief. Pt states she hasn't had a BM since last week.

## 2014-07-15 NOTE — ED Notes (Signed)
Pt has gone over to xray

## 2014-07-16 ENCOUNTER — Encounter (HOSPITAL_COMMUNITY): Payer: Self-pay | Admitting: *Deleted

## 2014-07-16 DIAGNOSIS — K5900 Constipation, unspecified: Secondary | ICD-10-CM

## 2014-07-16 DIAGNOSIS — K56609 Unspecified intestinal obstruction, unspecified as to partial versus complete obstruction: Secondary | ICD-10-CM | POA: Diagnosis present

## 2014-07-16 DIAGNOSIS — E785 Hyperlipidemia, unspecified: Secondary | ICD-10-CM | POA: Diagnosis present

## 2014-07-16 DIAGNOSIS — F17210 Nicotine dependence, cigarettes, uncomplicated: Secondary | ICD-10-CM | POA: Diagnosis present

## 2014-07-16 DIAGNOSIS — H540 Blindness, both eyes: Secondary | ICD-10-CM | POA: Diagnosis present

## 2014-07-16 DIAGNOSIS — K5901 Slow transit constipation: Secondary | ICD-10-CM

## 2014-07-16 DIAGNOSIS — K5730 Diverticulosis of large intestine without perforation or abscess without bleeding: Secondary | ICD-10-CM | POA: Diagnosis present

## 2014-07-16 DIAGNOSIS — I252 Old myocardial infarction: Secondary | ICD-10-CM | POA: Diagnosis not present

## 2014-07-16 DIAGNOSIS — I2510 Atherosclerotic heart disease of native coronary artery without angina pectoris: Secondary | ICD-10-CM

## 2014-07-16 DIAGNOSIS — R1084 Generalized abdominal pain: Secondary | ICD-10-CM

## 2014-07-16 DIAGNOSIS — R933 Abnormal findings on diagnostic imaging of other parts of digestive tract: Secondary | ICD-10-CM

## 2014-07-16 DIAGNOSIS — Q438 Other specified congenital malformations of intestine: Secondary | ICD-10-CM | POA: Diagnosis not present

## 2014-07-16 DIAGNOSIS — Z820 Family history of epilepsy and other diseases of the nervous system: Secondary | ICD-10-CM | POA: Diagnosis not present

## 2014-07-16 DIAGNOSIS — R109 Unspecified abdominal pain: Secondary | ICD-10-CM | POA: Diagnosis present

## 2014-07-16 DIAGNOSIS — Z66 Do not resuscitate: Secondary | ICD-10-CM | POA: Diagnosis present

## 2014-07-16 DIAGNOSIS — Z79899 Other long term (current) drug therapy: Secondary | ICD-10-CM | POA: Diagnosis not present

## 2014-07-16 DIAGNOSIS — K5660 Unspecified intestinal obstruction: Secondary | ICD-10-CM | POA: Diagnosis present

## 2014-07-16 DIAGNOSIS — Z833 Family history of diabetes mellitus: Secondary | ICD-10-CM | POA: Diagnosis not present

## 2014-07-16 DIAGNOSIS — Z7982 Long term (current) use of aspirin: Secondary | ICD-10-CM | POA: Diagnosis not present

## 2014-07-16 DIAGNOSIS — Z8249 Family history of ischemic heart disease and other diseases of the circulatory system: Secondary | ICD-10-CM | POA: Diagnosis not present

## 2014-07-16 DIAGNOSIS — K529 Noninfective gastroenteritis and colitis, unspecified: Secondary | ICD-10-CM | POA: Diagnosis present

## 2014-07-16 DIAGNOSIS — I10 Essential (primary) hypertension: Secondary | ICD-10-CM

## 2014-07-16 DIAGNOSIS — R1030 Lower abdominal pain, unspecified: Secondary | ICD-10-CM

## 2014-07-16 DIAGNOSIS — K219 Gastro-esophageal reflux disease without esophagitis: Secondary | ICD-10-CM | POA: Diagnosis present

## 2014-07-16 DIAGNOSIS — Z8673 Personal history of transient ischemic attack (TIA), and cerebral infarction without residual deficits: Secondary | ICD-10-CM | POA: Diagnosis not present

## 2014-07-16 DIAGNOSIS — H5441 Blindness, right eye, normal vision left eye: Secondary | ICD-10-CM | POA: Diagnosis present

## 2014-07-16 DIAGNOSIS — J449 Chronic obstructive pulmonary disease, unspecified: Secondary | ICD-10-CM | POA: Diagnosis present

## 2014-07-16 DIAGNOSIS — Z955 Presence of coronary angioplasty implant and graft: Secondary | ICD-10-CM | POA: Diagnosis not present

## 2014-07-16 LAB — CBC
HCT: 40.6 % (ref 36.0–46.0)
HEMOGLOBIN: 13.2 g/dL (ref 12.0–15.0)
MCH: 32.4 pg (ref 26.0–34.0)
MCHC: 32.5 g/dL (ref 30.0–36.0)
MCV: 99.5 fL (ref 78.0–100.0)
Platelets: 296 10*3/uL (ref 150–400)
RBC: 4.08 MIL/uL (ref 3.87–5.11)
RDW: 12.9 % (ref 11.5–15.5)
WBC: 11.7 10*3/uL — ABNORMAL HIGH (ref 4.0–10.5)

## 2014-07-16 LAB — COMPREHENSIVE METABOLIC PANEL
ALK PHOS: 122 U/L — AB (ref 39–117)
ALK PHOS: 118 U/L — AB (ref 39–117)
ALT: 7 U/L (ref 0–35)
ALT: 7 U/L (ref 0–35)
ANION GAP: 12 (ref 5–15)
AST: 14 U/L (ref 0–37)
AST: 15 U/L (ref 0–37)
Albumin: 4.3 g/dL (ref 3.5–5.2)
Albumin: 4.1 g/dL (ref 3.5–5.2)
Anion gap: 12 (ref 5–15)
BUN: 10 mg/dL (ref 6–23)
BUN: 9 mg/dL (ref 6–23)
CHLORIDE: 99 meq/L (ref 96–112)
CO2: 28 meq/L (ref 19–32)
CO2: 27 mEq/L (ref 19–32)
Calcium: 9.9 mg/dL (ref 8.4–10.5)
Calcium: 9.6 mg/dL (ref 8.4–10.5)
Chloride: 101 mEq/L (ref 96–112)
Creatinine, Ser: 0.85 mg/dL (ref 0.50–1.10)
Creatinine, Ser: 0.69 mg/dL (ref 0.50–1.10)
GFR calc Af Amer: >90 mL/min (ref >90)
GFR, EST AFRICAN AMERICAN: 82 mL/min — AB (ref >90)
GFR, EST NON AFRICAN AMERICAN: 70 mL/min — AB (ref >90)
GFR, EST NON AFRICAN AMERICAN: 89 mL/min — AB (ref >90)
Glucose, Bld: 133 mg/dL — ABNORMAL HIGH (ref 70–99)
Glucose, Bld: 148 mg/dL — ABNORMAL HIGH (ref 70–99)
POTASSIUM: 4.2 meq/L (ref 3.7–5.3)
Potassium: 4.5 mEq/L (ref 3.7–5.3)
SODIUM: 139 meq/L (ref 137–147)
Sodium: 140 mEq/L (ref 137–147)
TOTAL PROTEIN: 8.3 g/dL (ref 6.0–8.3)
Total Bilirubin: 0.6 mg/dL (ref 0.3–1.2)
Total Bilirubin: 0.6 mg/dL (ref 0.3–1.2)
Total Protein: 8.8 g/dL — ABNORMAL HIGH (ref 6.0–8.3)

## 2014-07-16 LAB — URINALYSIS, ROUTINE W REFLEX MICROSCOPIC
GLUCOSE, UA: NEGATIVE mg/dL
HGB URINE DIPSTICK: NEGATIVE
Ketones, ur: 15 mg/dL — AB
Leukocytes, UA: NEGATIVE
Nitrite: NEGATIVE
PROTEIN: 30 mg/dL — AB
Specific Gravity, Urine: >1.03 — ABNORMAL HIGH (ref 1.005–1.030)
UROBILINOGEN UA: 1 mg/dL (ref 0.0–1.0)
pH: 5.5 (ref 5.0–8.0)

## 2014-07-16 LAB — URINE MICROSCOPIC-ADD ON

## 2014-07-16 MED ORDER — PEG 3350-KCL-NA BICARB-NACL 420 G PO SOLR
4000.0000 mL | Freq: Once | ORAL | Status: AC
  Administered 2014-07-16: 4000 mL via ORAL
  Filled 2014-07-16: qty 4000

## 2014-07-16 MED ORDER — ONDANSETRON HCL 4 MG/2ML IJ SOLN: 4.0000 mg | Freq: Four times a day (QID) | INTRAMUSCULAR | Status: DC | PRN

## 2014-07-16 MED ORDER — CLONIDINE HCL 0.2 MG PO TABS
0.3000 mg | ORAL_TABLET | Freq: Two times a day (BID) | ORAL | Status: DC
  Administered 2014-07-16 – 2014-07-18 (×6): 0.3 mg via ORAL
  Filled 2014-07-16 (×12): qty 1

## 2014-07-16 MED ORDER — SODIUM CHLORIDE 0.9 % IV SOLN
INTRAVENOUS | Status: DC
  Administered 2014-07-16 – 2014-07-17 (×2): via INTRAVENOUS

## 2014-07-16 MED ORDER — MORPHINE SULFATE 2 MG/ML IJ SOLN
2.0000 mg | INTRAMUSCULAR | Status: DC | PRN
  Administered 2014-07-16: 2 mg via INTRAVENOUS
  Filled 2014-07-16: qty 1

## 2014-07-16 MED ORDER — ENOXAPARIN SODIUM 40 MG/0.4ML ~~LOC~~ SOLN: 40.0000 mg | SUBCUTANEOUS | Status: DC

## 2014-07-16 MED ORDER — ONDANSETRON HCL 4 MG PO TABS: 4.0000 mg | ORAL_TABLET | Freq: Four times a day (QID) | ORAL | Status: DC | PRN

## 2014-07-16 MED ORDER — INFLUENZA VAC SPLIT QUAD 0.5 ML IM SUSY
0.5000 mL | PREFILLED_SYRINGE | INTRAMUSCULAR | Status: AC
  Administered 2014-07-17: 0.5 mL via INTRAMUSCULAR
  Filled 2014-07-16: qty 0.5

## 2014-07-16 MED ORDER — METRONIDAZOLE IN NACL 5-0.79 MG/ML-% IV SOLN
500.0000 mg | Freq: Three times a day (TID) | INTRAVENOUS | Status: DC
  Administered 2014-07-16 – 2014-07-18 (×7): 500 mg via INTRAVENOUS
  Filled 2014-07-16 (×7): qty 100

## 2014-07-16 MED ORDER — METOPROLOL TARTRATE 50 MG PO TABS
50.0000 mg | ORAL_TABLET | Freq: Two times a day (BID) | ORAL | Status: DC
  Administered 2014-07-16 – 2014-07-18 (×6): 50 mg via ORAL
  Filled 2014-07-16 (×6): qty 1

## 2014-07-16 MED ORDER — NICOTINE 21 MG/24HR TD PT24
21.0000 mg | MEDICATED_PATCH | Freq: Every day | TRANSDERMAL | Status: DC
  Administered 2014-07-16 – 2014-07-18 (×4): 21 mg via TRANSDERMAL
  Filled 2014-07-16 (×4): qty 1

## 2014-07-16 MED ORDER — CIPROFLOXACIN IN D5W 400 MG/200ML IV SOLN
400.0000 mg | Freq: Two times a day (BID) | INTRAVENOUS | Status: DC
  Administered 2014-07-16 – 2014-07-18 (×4): 400 mg via INTRAVENOUS
  Filled 2014-07-16 (×4): qty 200

## 2014-07-16 MED ORDER — AMLODIPINE BESYLATE 5 MG PO TABS
10.0000 mg | ORAL_TABLET | Freq: Every day | ORAL | Status: DC
  Administered 2014-07-16 – 2014-07-18 (×3): 10 mg via ORAL
  Filled 2014-07-16 (×3): qty 2

## 2014-07-16 MED ORDER — ACETAMINOPHEN 650 MG RE SUPP: 650.0000 mg | Freq: Four times a day (QID) | RECTAL | Status: DC | PRN

## 2014-07-16 MED ORDER — ACETAMINOPHEN 325 MG PO TABS
650.0000 mg | ORAL_TABLET | Freq: Four times a day (QID) | ORAL | Status: DC | PRN
  Administered 2014-07-17 – 2014-07-18 (×2): 650 mg via ORAL
  Filled 2014-07-16 (×2): qty 2

## 2014-07-16 NOTE — Progress Notes (Signed)
NUTRITION ASSESSMENT  INTERVENTION: Will follow her care progression and support nutrition care  NUTRITION DIAGNOSIS: Involuntary weight loss  related to decreased appetite as evidenced by 15#, 13% weight loss over past month  Goal: Pt to meet >/= 90% of their estimated nutrition needs    Monitor:  Po intake, labs and wt trends   Reason for Assessment: Malnutrition Screen Score =  3  63 y.o. female   ASSESSMENT: Pt presents with abdominal pain. CT showed large proximal colonic stool burden and focal colitis (no significant inflammation).   She c/o of early satiety says, she just hasn't been able to eat more than a few bites and has no appetite for past 2-3 weeks. She has severe wt loss of 13% in < 30 days.  She is NPO after midnight for colonoscopy tomorrow.   Height: Ht Readings from Last 1 Encounters:  07/16/14 5' (1.524 m)    Weight: Wt Readings from Last 1 Encounters:  07/16/14 117 lb 1 oz (53.1 kg)    Ideal Body Weight: 100#  % Ideal Body Weight: 117%  Wt Readings from Last 10 Encounters:  07/16/14 117 lb 1 oz (53.1 kg)  10/27/12 134 lb 12.8 oz (61.145 kg)  10/25/12 134 lb 1.9 oz (60.836 kg)  10/03/12 145 lb (65.772 kg)  10/07/11 138 lb (62.596 kg)  09/08/11 140 lb 10.5 oz (63.8 kg)  09/08/11 140 lb 10.5 oz (63.8 kg)    Usual Body Weight: 130-135#  % Usual Body Weight: 87%  BMI:  Body mass index is 22.86 kg/(m^2).normal range  Estimated Nutritional Needs: Kcal: 1400-1600 Protein: 64-79 gr Fluid: 1.4-1.6 liters daily  Skin: intact  Diet Order: Clear Liquid  EDUCATION NEEDS: -No education needs identified at this time  No intake or output data in the 24 hours ending 07/16/14 1413  Last BM: 10/26  Labs:   Recent Labs Lab 07/15/14 2326 07/16/14 0609  NA 139 140  K 4.2 4.5  CL 99 101  CO2 28 27  BUN 10 9  CREATININE 0.85 0.69  CALCIUM 9.9 9.6  GLUCOSE 133* 148*    CBG (last 3)  No results found for this basename: GLUCAP,  in  the last 72 hours  Scheduled Meds: . amLODipine  10 mg Oral Daily  . ciprofloxacin  400 mg Intravenous Q12H  . cloNIDine  0.3 mg Oral BID  . [START ON 07/17/2014] Influenza vac split quadrivalent PF  0.5 mL Intramuscular Tomorrow-1000  . metoprolol  50 mg Oral BID  . metronidazole  500 mg Intravenous Q8H  . nicotine  21 mg Transdermal Daily    Continuous Infusions: . sodium chloride 75 mL/hr at 07/16/14 0253    Past Medical History  Diagnosis Date  . Myocardial infarct     1997, 2004  . Hypertension   . Hyperlipidemia   . CVA (cerebral infarction)   . Severe major depression with psychotic features     2004  . Anxiety   . Blind     right  . GERD (gastroesophageal reflux disease)   . Stroke 2002  . Colitis 09/07/2011    Past Surgical History  Procedure Laterality Date  . Abdominal hysterectomy    . Lumps removed from each breast    . Bilateral salpingoophorectomy    . Coronary angioplasty with stent placement  [id number],2004  . Esophagogastroduodenoscopy  09/05/2004    Dr Jimmye Normanehman-erosive gastritis, candida esophagitis  . Cyst removed from left wrist    . Esophagogastroduodenoscopy  09/07/2011  PRF:FMBW, esophageal in the distal esophagus/Mild gastritis    Royann Shivers MS,RD,CSG,LDN Office: [phone] Pager: [phone]

## 2014-07-16 NOTE — Consult Note (Signed)
Patient seen, chart reviewed. Full consult to follow pending GI workup.

## 2014-07-16 NOTE — ED Notes (Signed)
Report given to Denise RN on 300 

## 2014-07-16 NOTE — Progress Notes (Signed)
Subjective: Patient was admitted last night due to abdominal pain and constipation. Her Ct scan showed large proximal colonic stool burden and focal colitis. Patient had bowel movement since admission. She is started on combination IV antibiotics and GI consult is requested.  Objective: Vital signs in last 24 hours: Temp:  [97.7 F (36.5 C)-98.7 F (37.1 C)] 98.7 F (37.1 C) (10/26 0559) Pulse Rate:  [69-76] 69 (10/26 0559) Resp:  [20] 20 (10/26 0559) BP: (135-171)/(52-95) 135/95 mmHg (10/26 0559) SpO2:  [94 %-100 %] 100 % (10/26 0559) Weight:  [53.1 kg (117 lb 1 oz)-56.7 kg (125 lb)] 53.1 kg (117 lb 1 oz) (10/26 0230) Weight change:  Last BM Date: 07/16/14  Intake/Output from previous day:    PHYSICAL EXAM General appearance: alert and no distress Resp: clear to auscultation bilaterally Cardio: S1, S2 normal GI: soft, non-tender; bowel sounds normal; no masses,  no organomegaly Extremities: extremities normal, atraumatic, no cyanosis or edema  Lab Results:  Results for orders placed during the hospital encounter of 07/15/14 (from the past 48 hour(s))  POC OCCULT BLOOD, ED     Status: Abnormal   Collection Time    07/15/14 11:05 PM      Result Value Ref Range   Fecal Occult Bld POSITIVE (*) NEGATIVE  CBC WITH DIFFERENTIAL     Status: None   Collection Time    07/15/14 11:26 PM      Result Value Ref Range   WBC 10.3  4.0 - 10.5 K/uL   RBC 4.23  3.87 - 5.11 MIL/uL   Hemoglobin 14.0  12.0 - 15.0 g/dL   HCT 41.8  36.0 - 46.0 %   MCV 98.8  78.0 - 100.0 fL   MCH 33.1  26.0 - 34.0 pg   MCHC 33.5  30.0 - 36.0 g/dL   RDW 12.8  11.5 - 15.5 %   Platelets 265  150 - 400 K/uL   Neutrophils Relative % 67  43 - 77 %   Neutro Abs 7.0  1.7 - 7.7 K/uL   Lymphocytes Relative 24  12 - 46 %   Lymphs Abs 2.4  0.7 - 4.0 K/uL   Monocytes Relative 8  3 - 12 %   Monocytes Absolute 0.8  0.1 - 1.0 K/uL   Eosinophils Relative 1  0 - 5 %   Eosinophils Absolute 0.1  0.0 - 0.7 K/uL   Basophils Relative 0  0 - 1 %   Basophils Absolute 0.0  0.0 - 0.1 K/uL  COMPREHENSIVE METABOLIC PANEL     Status: Abnormal   Collection Time    07/15/14 11:26 PM      Result Value Ref Range   Sodium 139  137 - 147 mEq/L   Potassium 4.2  3.7 - 5.3 mEq/L   Chloride 99  96 - 112 mEq/L   CO2 28  19 - 32 mEq/L   Glucose, Bld 133 (*) 70 - 99 mg/dL   BUN 10  6 - 23 mg/dL   Creatinine, Ser 0.85  0.50 - 1.10 mg/dL   Calcium 9.9  8.4 - 10.5 mg/dL   Total Protein 8.8 (*) 6.0 - 8.3 g/dL   Albumin 4.3  3.5 - 5.2 g/dL   AST 14  0 - 37 U/L   ALT 7  0 - 35 U/L   Alkaline Phosphatase 122 (*) 39 - 117 U/L   Total Bilirubin 0.6  0.3 - 1.2 mg/dL   GFR calc non Af Amer 70 (*) >  90 mL/min   GFR calc Af Amer 82 (*) >90 mL/min   Comment: (NOTE)     The eGFR has been calculated using the CKD EPI equation.     This calculation has not been validated in all clinical situations.     eGFR's persistently <90 mL/min signify possible Chronic Kidney     Disease.   Anion gap 12  5 - 15  URINALYSIS, ROUTINE W REFLEX MICROSCOPIC     Status: Abnormal   Collection Time    07/16/14  2:44 AM      Result Value Ref Range   Color, Urine YELLOW  YELLOW   APPearance CLEAR  CLEAR   Specific Gravity, Urine >1.030 (*) 1.005 - 1.030   pH 5.5  5.0 - 8.0   Glucose, UA NEGATIVE  NEGATIVE mg/dL   Hgb urine dipstick NEGATIVE  NEGATIVE   Bilirubin Urine SMALL (*) NEGATIVE   Ketones, ur 15 (*) NEGATIVE mg/dL   Protein, ur 30 (*) NEGATIVE mg/dL   Urobilinogen, UA 1.0  0.0 - 1.0 mg/dL   Nitrite NEGATIVE  NEGATIVE   Leukocytes, UA NEGATIVE  NEGATIVE  URINE MICROSCOPIC-ADD ON     Status: Abnormal   Collection Time    07/16/14  2:44 AM      Result Value Ref Range   Squamous Epithelial / LPF FEW (*) RARE   WBC, UA 0-2  <3 WBC/hpf   RBC / HPF 7-10  <3 RBC/hpf   Bacteria, UA FEW (*) RARE  CBC     Status: Abnormal   Collection Time    07/16/14  6:09 AM      Result Value Ref Range   WBC 11.7 (*) 4.0 - 10.5 K/uL   RBC 4.08   3.87 - 5.11 MIL/uL   Hemoglobin 13.2  12.0 - 15.0 g/dL   HCT 40.6  36.0 - 46.0 %   MCV 99.5  78.0 - 100.0 fL   MCH 32.4  26.0 - 34.0 pg   MCHC 32.5  30.0 - 36.0 g/dL   RDW 12.9  11.5 - 15.5 %   Platelets 296  150 - 400 K/uL  COMPREHENSIVE METABOLIC PANEL     Status: Abnormal   Collection Time    07/16/14  6:09 AM      Result Value Ref Range   Sodium 140  137 - 147 mEq/L   Potassium 4.5  3.7 - 5.3 mEq/L   Chloride 101  96 - 112 mEq/L   CO2 27  19 - 32 mEq/L   Glucose, Bld 148 (*) 70 - 99 mg/dL   BUN 9  6 - 23 mg/dL   Creatinine, Ser 0.69  0.50 - 1.10 mg/dL   Calcium 9.6  8.4 - 10.5 mg/dL   Total Protein 8.3  6.0 - 8.3 g/dL   Albumin 4.1  3.5 - 5.2 g/dL   AST 15  0 - 37 U/L   ALT 7  0 - 35 U/L   Alkaline Phosphatase 118 (*) 39 - 117 U/L   Total Bilirubin 0.6  0.3 - 1.2 mg/dL   GFR calc non Af Amer 89 (*) >90 mL/min   GFR calc Af Amer >90  >90 mL/min   Comment: (NOTE)     The eGFR has been calculated using the CKD EPI equation.     This calculation has not been validated in all clinical situations.     eGFR's persistently <90 mL/min signify possible Chronic Kidney     Disease.  Anion gap 12  5 - 15    ABGS No results found for this basename: PHART, PCO2, PO2ART, TCO2, HCO3,  in the last 72 hours CULTURES No results found for this or any previous visit (from the past 240 hour(s)). Studies/Results: Ct Abdomen Pelvis Wo Contrast  07/16/2014   CLINICAL DATA:  Constipation.  No bowel movement for 1 week.  EXAM: CT ABDOMEN AND PELVIS WITHOUT CONTRAST  TECHNIQUE: Multidetector CT imaging of the abdomen and pelvis was performed following the standard protocol without IV contrast.  COMPARISON:  09/07/2011  FINDINGS: Lung bases are clear. Coronary artery calcifications. Normal heart size.  Organ evaluation is limited in the absence of intravenous contrast. Within this limitation, rounded hypodensity within the left hepatic lobe is most in keeping with a biliary cyst.  No radiodense  gallstones or biliary ductal dilatation. No appreciable abnormality of the spleen, pancreas, adrenal glands. Upper pole left renal cyst measures water attenuation. Otherwise, symmetric renal attenuation. No urinary tract calculi. No hydroureteronephrosis.  Large stool burden. Rectum and distal sigmoid colon are decompressed. There is mild proximal perisigmoid colonic fat stranding and ill-defined fluid. Tiny locule of air on series 2, image 61 may be within a tiny diverticulum (though none seen here on the prior) or extraluminal.  Appendix not identified. No right lower quadrant inflammation. Small bowel loops are of normal caliber.  No lymphadenopathy.  Scattered atherosclerotic disease of the aorta and branch vessels without aneurysmal dilatation.  Thin walled bladder.  Absent uterus.  No adnexal mass.  No acute osseous finding.  L5-S1 degenerative disc disease.  IMPRESSION: Large proximal colonic stool burden, focal transition along the proximal sigmoid colon were there is pericolonic fat stranding and ill-defined fluid. This suggests a focal colitis (infectious, inflammatory, or ischemic) causing delayed transit, with underlying mass not excluded. A tiny adjacent locule of air (sagittal image 52) may be extraluminal.  Discussed via telephone with Dr. Christy Gentles at 12:25 a.m. on 07/16/2014.   Electronically Signed   By: Carlos Levering M.D.   On: 07/16/2014 00:30   Dg Abd Acute W/chest  07/15/2014   CLINICAL DATA:  Constipation.  Initial encounter  EXAM: ACUTE ABDOMEN SERIES (ABDOMEN 2 VIEW & CHEST 1 VIEW)  COMPARISON:  10/03/2012  FINDINGS: There is no evidence of bowel obstruction or perforation. Stool volume is within normal limits. No concerning intra-abdominal mass effect or calcification.  Pulmonary hyperinflation with apical emphysematous changes. There is no edema, consolidation, effusion, or pneumothorax. Negative aortic contours. Normal heart size.  IMPRESSION: 1. No evidence of bowel obstruction or  perforation. 2. COPD.   Electronically Signed   By: Jorje Guild M.D.   On: 07/15/2014 23:02    Medications: I have reviewed the patient's current medications.  Assesment: Active Problems:   Colonic obstruction   Colitis Hypertension CAD Hyperlipedemia   Plan: Medications reviewed Will keep NPO until evaluated by GI Continue IV antibiotics    LOS: 1 day   Adiel Erney 07/16/2014, 7:49 AM

## 2014-07-16 NOTE — Consult Note (Addendum)
Referring Provider: Avon Gully, MD Primary Care Physician:  Avon Gully, MD Primary Gastroenterologist:  Jonette Eva, MD  Reason for Consultation:  Colitis, ?colon mass.  HPI: [name] is a 63 y.o. female presented with complaints of constipation, abdominal pain. History of chronic constipation but had been doing well up until few weeks ago. She stopped Miralax several months ago. Felt like didn't need it. Last good BM almost 2 weeks ago. Started back on Miralax, took almost whole bottle in the past week. Vomited couple of weeks ago. Lost 15 pounds over several weeks. Some brbpr on toilet tissue with straining. Diffuse abd pain. GERD well-controlled.  Heme positive on DRE in ER. No significant fecal impaction per report. Noncontrast CT showed large proximal colonic stool burden, focal transition along the proximal sigmoid colon where there is pericolonic fat stranding and ill-defined fluid. ?tiny adjacent locule of air may be extraluminal.   Two good BM in ER last night.    No prior colonoscopy.    Prior to Admission medications   Medication Sig Start Date End Date Taking? Authorizing Provider  amLODipine (NORVASC) 10 MG tablet Take 10 mg by mouth daily.   Yes Historical Provider, MD  aspirin EC 81 MG tablet Take 81 mg by mouth daily.     Yes Historical Provider, MD  cloNIDine (CATAPRES) 0.3 MG tablet Take 0.3 mg by mouth 2 (two) times daily.   Yes Historical Provider, MD  dextromethorphan-guaiFENesin (MUCINEX DM) 30-600 MG per 12 hr tablet Take 1 tablet by mouth 2 (two) times daily.   Yes Historical Provider, MD  docusate sodium (COLACE) 100 MG capsule Take 100 mg by mouth daily.     Yes Historical Provider, MD  furosemide (LASIX) 20 MG tablet Take 20 mg by mouth daily.   Yes Historical Provider, MD  metoprolol (LOPRESSOR) 50 MG tablet Take 1 tablet (50 mg total) by mouth 2 (two) times daily. 10/26/12  Yes Salley Scarlet, MD  omeprazole (PRILOSEC) 20 MG capsule Take 20 mg by  mouth daily. 03/22/13  Yes West Bali, MD  polyethylene glycol powder (GLYCOLAX/MIRALAX) powder Take 17 g by mouth daily.   Yes Historical Provider, MD  simvastatin (ZOCOR) 40 MG tablet Take 1 tablet (40 mg total) by mouth daily. 10/26/12  Yes Salley Scarlet, MD  traMADol (ULTRAM) 50 MG tablet Take 50 mg by mouth every 6 (six) hours as needed for moderate pain. 10/03/12  Yes Benny Lennert, MD    Current Facility-Administered Medications  Medication Dose Route Frequency Provider Last Rate Last Dose  . 0.9 %  sodium chloride infusion   Intravenous Continuous Meredeth Ide, MD 75 mL/hr at 07/16/14 0253    . acetaminophen (TYLENOL) tablet 650 mg  650 mg Oral Q6H PRN Meredeth Ide, MD       Or  . acetaminophen (TYLENOL) suppository 650 mg  650 mg Rectal Q6H PRN Meredeth Ide, MD      . amLODipine (NORVASC) tablet 10 mg  10 mg Oral Daily Meredeth Ide, MD      . ciprofloxacin (CIPRO) IVPB 400 mg  400 mg Intravenous Q12H Meredeth Ide, MD   400 mg at 07/16/14 0531  . cloNIDine (CATAPRES) tablet 0.3 mg  0.3 mg Oral BID Meredeth Ide, MD   0.3 mg at 07/16/14 0330  . enoxaparin (LOVENOX) injection 40 mg  40 mg Subcutaneous Q24H Meredeth Ide, MD      . metoprolol (LOPRESSOR) tablet 50 mg  50 mg Oral BID Meredeth Ide, MD   50 mg at 07/16/14 0330  . metroNIDAZOLE (FLAGYL) IVPB 500 mg  500 mg Intravenous Q8H Meredeth Ide, MD   500 mg at 07/16/14 0330  . morphine 2 MG/ML injection 2 mg  2 mg Intravenous Q4H PRN Meredeth Ide, MD   2 mg at 07/16/14 0217  . nicotine (NICODERM CQ - dosed in mg/24 hours) patch 21 mg  21 mg Transdermal Daily Meredeth Ide, MD   21 mg at 07/16/14 0329  . ondansetron (ZOFRAN) tablet 4 mg  4 mg Oral Q6H PRN Meredeth Ide, MD       Or  . ondansetron (ZOFRAN) injection 4 mg  4 mg Intravenous Q6H PRN Meredeth Ide, MD        Allergies as of 07/15/2014 - Review Complete 07/15/2014  Allergen Reaction Noted  . Penicillins Anaphylaxis 09/06/2011  . Aspirin Swelling and Other (See Comments)  09/07/2011  . Bee venom Swelling 09/07/2011  . Contrast media [iodinated diagnostic agents] Swelling 09/07/2011  . Dilaudid [hydromorphone hcl] Itching 09/07/2011  . Lisinopril Swelling 09/07/2011  . Motrin [ibuprofen] Swelling 09/07/2011    Past Medical History  Diagnosis Date  . Myocardial infarct     1997, 2004  . Hypertension   . Hyperlipidemia   . CVA (cerebral infarction)   . Severe major depression with psychotic features     2004  . Anxiety   . Blind     right  . GERD (gastroesophageal reflux disease)   . Stroke 2002  . Colitis 09/07/2011    Past Surgical History  Procedure Laterality Date  . Abdominal hysterectomy    . Lumps removed from each breast    . Bilateral salpingoophorectomy    . Coronary angioplasty with stent placement  3838,1840  . Esophagogastroduodenoscopy  09/05/2004    Dr Jimmye Norman gastritis, candida esophagitis  . Cyst removed from left wrist    . Esophagogastroduodenoscopy  09/07/2011    RFV:OHKG, esophageal in the distal esophagus/Mild gastritis    Family History  Problem Relation Age of Onset  . Multiple sclerosis Father   . Alzheimer's disease Mother   . Hypertension Mother   . Diabetes Mother   . Colon cancer Neg Hx     History   Social History  . Marital Status: Widowed    Spouse Name: N/A    Number of Children: 3  . Years of Education: N/A   Occupational History  . disabled, Best boy    Social History Main Topics  . Smoking status: Current Some Day Smoker -- 0.25 packs/day for 40 years    Types: Cigarettes  . Smokeless tobacco: Never Used  . Alcohol Use: No  . Drug Use: No  . Sexual Activity: Not Currently    Birth Control/ Protection: Post-menopausal   Other Topics Concern  . Not on file   Social History Narrative   Lives alone     ROS:  General: no fever. See HPI. Eyes: Negative for vision changes.  ENT: Negative for hoarseness, difficulty swallowing , nasal congestion. CV: Negative for chest  pain, angina, palpitations, dyspnea on exertion, peripheral edema.  Respiratory: Negative for dyspnea at rest, dyspnea on exertion, cough, sputum, wheezing.  GI: See history of present illness. GU:  Negative for dysuria, hematuria, urinary incontinence, urinary frequency, nocturnal urination.  MS: Negative for joint pain, low back pain.  Derm: Negative for rash or itching.  Neuro: Negative for weakness, abnormal sensation, seizure,  frequent headaches, memory loss, confusion.  Psych: Negative for anxiety, depression, suicidal ideation, hallucinations.  Endo: see hpi Heme: Negative for bruising or bleeding. Allergy: Negative for rash or hives.       Physical Examination: Vital signs in last 24 hours: Temp:  [97.7 F (36.5 C)-98.7 F (37.1 C)] 98.7 F (37.1 C) (10/26 0559) Pulse Rate:  [69-76] 69 (10/26 0559) Resp:  [20] 20 (10/26 0559) BP: (135-171)/(52-95) 135/95 mmHg (10/26 0559) SpO2:  [94 %-100 %] 100 % (10/26 0559) Weight:  [117 lb 1 oz (53.1 kg)-125 lb (56.7 kg)] 117 lb 1 oz (53.1 kg) (10/26 0230) Last BM Date: 07/16/14  General: Well-nourished, well-developed in no acute distress.  Head: Normocephalic, atraumatic.   Eyes: Conjunctiva pink, no icterus. Mouth: Oropharyngeal mucosa moist and pink , no lesions erythema or exudate. Neck: Supple without thyromegaly, masses, or lymphadenopathy.  Lungs: Clear to auscultation bilaterally.  Heart: Regular rate and rhythm, no murmurs rubs or gallops.  Abdomen: Bowel sounds are normal, mild mid-lower abd tenderness, nondistended, no hepatosplenomegaly or masses, no abdominal bruits or    hernia , no rebound or guarding.   Rectal: not performed. Done in ER Extremities: No lower extremity edema, clubbing, deformity.  Neuro: Alert and oriented x 4 , grossly normal neurologically.  Skin: Warm and dry, no rash or jaundice.   Psych: Alert and cooperative, normal mood and affect.        Intake/Output from previous day:   Intake/Output  this shift:    Lab Results: CBC  Recent Labs  07/15/14 2326 07/16/14 0609  WBC 10.3 11.7*  HGB 14.0 13.2  HCT 41.8 40.6  MCV 98.8 99.5  PLT 265 296   BMET  Recent Labs  07/15/14 2326 07/16/14 0609  NA 139 140  K 4.2 4.5  CL 99 101  CO2 28 27  GLUCOSE 133* 148*  BUN 10 9  CREATININE 0.85 0.69  CALCIUM 9.9 9.6   LFT  Recent Labs  07/15/14 2326 07/16/14 0609  BILITOT 0.6 0.6  ALKPHOS 122* 118*  AST 14 15  ALT 7 7  PROT 8.8* 8.3  ALBUMIN 4.3 4.1    Lipase No results found for this basename: LIPASE,  in the last 72 hours  PT/INR No results found for this basename: LABPROT, INR,  in the last 72 hours    Imaging Studies: Ct Abdomen Pelvis Wo Contrast  07/16/2014   CLINICAL DATA:  Constipation.  No bowel movement for 1 week.  EXAM: CT ABDOMEN AND PELVIS WITHOUT CONTRAST  TECHNIQUE: Multidetector CT imaging of the abdomen and pelvis was performed following the standard protocol without IV contrast.  COMPARISON:  09/07/2011  FINDINGS: Lung bases are clear. Coronary artery calcifications. Normal heart size.  Organ evaluation is limited in the absence of intravenous contrast. Within this limitation, rounded hypodensity within the left hepatic lobe is most in keeping with a biliary cyst.  No radiodense gallstones or biliary ductal dilatation. No appreciable abnormality of the spleen, pancreas, adrenal glands. Upper pole left renal cyst measures water attenuation. Otherwise, symmetric renal attenuation. No urinary tract calculi. No hydroureteronephrosis.  Large stool burden. Rectum and distal sigmoid colon are decompressed. There is mild proximal perisigmoid colonic fat stranding and ill-defined fluid. Tiny locule of air on series 2, image 61 may be within a tiny diverticulum (though none seen here on the prior) or extraluminal.  Appendix not identified. No right lower quadrant inflammation. Small bowel loops are of normal caliber.  No lymphadenopathy.  Scattered  atherosclerotic disease of the aorta and branch vessels without aneurysmal dilatation.  Thin walled bladder.  Absent uterus.  No adnexal mass.  No acute osseous finding.  L5-S1 degenerative disc disease.  IMPRESSION: Large proximal colonic stool burden, focal transition along the proximal sigmoid colon were there is pericolonic fat stranding and ill-defined fluid. This suggests a focal colitis (infectious, inflammatory, or ischemic) causing delayed transit, with underlying mass not excluded. A tiny adjacent locule of air (sagittal image 52) may be extraluminal.  Discussed via telephone with Dr. Bebe ShaggyWickline at 12:25 a.m. on 07/16/2014.   Electronically Signed By: Jearld LeschAndrew  DelGaizo M.D.   On: 07/16/2014 00:30   Dg Abd Acute W/chest  07/15/2014   CLINICAL DATA:  Constipation.  Initial encounter  EXAM: ACUTE ABDOMEN SERIES (ABDOMEN 2 VIEW & CHEST 1 VIEW)  COMPARISON:  10/03/2012  FINDINGS: There is no evidence of bowel obstruction or perforation. Stool volume is within normal limits. No concerning intra-abdominal mass effect or calcification.  Pulmonary hyperinflation with apical emphysematous changes. There is no edema, consolidation, effusion, or pneumothorax. Negative aortic contours. Normal heart size.  IMPRESSION: 1. No evidence of bowel obstruction or perforation. 2. COPD.   Electronically Signed   By: Tiburcio PeaJonathan  Watts M.D.   On: 07/15/2014 23:02  [4 week]   Impression: 63 y/o lady with baseline constipation who presented with 2 week history of worsening symptoms. No good BM in this period of time. DRE showed heme + stool but no fecal impaction. CT showed large proximal colonic stool burden with decompression in rectum/distal sigmoid. Stranding noted around the proximal sigmoid region. ?air within tiny diverticulum vs extraluminal. Possible focal colitis but underlying mass not excluded. CT films reviewed by Dr. Jena Gaussourk with radiologist, felt to be no significant inflammation in stool noted throughout the colon,  no real transition point. Patient has been seen briefly by Dr. Lovell SheehanJenkins and no need for acute surgical intervention at this time. Mild bump in WBC today. Patient currently on Cipro/Flagyl.   Discussed with Dr. Jena Gaussourk. He recommends colonoscopy this admission. Discussed with patient and she is agreeable at this time. Will start colonic purge today. She reports that she has had several large soft BMs since admission.  I have discussed the risks, alternatives, benefits with regards to but not limited to the risk of reaction to medication, bleeding, infection, perforation and the patient is agreeable to proceed. Written consent to be obtained.   Plan: 1. Colonoscopy tomorrow. 2. Lovenox discontinued in preparation for procedure today. SCDs ordered.   We would like to thank you for the opportunity to participate in the care of [name].    LOS: 1 day   Tana CoastLeslie Lewis  07/16/2014, 7:54 AM  Attending note:  Patient seen and examined. Agree with above assessment and recommendations.

## 2014-07-16 NOTE — H&P (Signed)
PCP:   [name],TESFAYE, MD   Chief Complaint:  Constipation, abdominal pain  HPI: 63 year old female who   has a past medical history of Myocardial infarct; Hypertension; Hyperlipidemia; CVA (cerebral infarction); Severe major depression with psychotic features; Anxiety; Blind; GERD (gastroesophageal reflux disease); Stroke (2002); and Colitis (09/07/2011). Today came to the hospital with chief complaint of abdominal pain and constipation. Patient's last bowel movement was more than a week ago. And she developed abdominal pain, with 1-2 episodes of nausea and vomiting over the past 1 week. Patient has not been eating and drinking well. She denies fever, no chest pain or shortness of breath. Patient does have a history of CAD. In the ED CT scan of the abdomen and pelvis was done which showed large proximal colonic stool burden, focal colitis causing delayed transit with underlying mass not excluded. A tiny adjacent locule of air may be extraluminal. ED physician Dr. Bebe ShaggyWickline called the surgeon on call Dr. Lovell SheehanJenkins, to discuss the CT findings. He recommended medical admission and GI consultation in a.m. for  possible colonoscopy. Allergies:   Allergies  Allergen Reactions  . Penicillins Anaphylaxis  . Aspirin Swelling and Other (See Comments)    Only in large doses will cause a reaction  . Bee Venom Swelling    Severe swelling  . Contrast Media [Iodinated Diagnostic Agents] Swelling  . Dilaudid [Hydromorphone Hcl] Itching  . Lisinopril Swelling  . Motrin [Ibuprofen] Swelling      Past Medical History  Diagnosis Date  . Myocardial infarct     1997, 2004  . Hypertension   . Hyperlipidemia   . CVA (cerebral infarction)   . Severe major depression with psychotic features     2004  . Anxiety   . Blind     right  . GERD (gastroesophageal reflux disease)   . Stroke 2002  . Colitis 09/07/2011    Past Surgical History  Procedure Laterality Date  . Abdominal hysterectomy    .  Lumps removed from each breast    . Bilateral salpingoophorectomy    . Coronary angioplasty with stent placement  [id number]  . Esophagogastroduodenoscopy  09/05/2004    Dr Jimmye Normanehman-erosive gastritis, candida esophagitis  . Cyst removed from left wrist    . Esophagogastroduodenoscopy  09/07/2011    VWU:[id number]:Ring, esophageal in the distal esophagus/Mild gastritis    Prior to Admission medications   Medication Sig Start Date End Date Taking? Authorizing Provider  amLODipine (NORVASC) 10 MG tablet Take 10 mg by mouth daily.   Yes Historical Provider, MD  aspirin EC 81 MG tablet Take 81 mg by mouth daily.     Yes Historical Provider, MD  cloNIDine (CATAPRES) 0.3 MG tablet Take 0.3 mg by mouth 2 (two) times daily.   Yes Historical Provider, MD  dextromethorphan-guaiFENesin (MUCINEX DM) 30-600 MG per 12 hr tablet Take 1 tablet by mouth 2 (two) times daily.   Yes Historical Provider, MD  docusate sodium (COLACE) 100 MG capsule Take 100 mg by mouth daily.     Yes Historical Provider, MD  furosemide (LASIX) 20 MG tablet Take 20 mg by mouth daily.   Yes Historical Provider, MD  metoprolol (LOPRESSOR) 50 MG tablet Take 1 tablet (50 mg total) by mouth 2 (two) times daily. 10/26/12  Yes Salley ScarletKawanta F Niwot, MD  omeprazole (PRILOSEC) 20 MG capsule Take 20 mg by mouth daily. 03/22/13  Yes West BaliSandi L Fields, MD  polyethylene glycol powder (GLYCOLAX/MIRALAX) powder Take 17 g by mouth daily.   Yes Historical  Provider, MD  simvastatin (ZOCOR) 40 MG tablet Take 1 tablet (40 mg total) by mouth daily. 10/26/12  Yes Salley Scarlet, MD  traMADol (ULTRAM) 50 MG tablet Take 50 mg by mouth every 6 (six) hours as needed for moderate pain. 10/03/12  Yes Benny Lennert, MD    Social History:  reports that she has been smoking Cigarettes.  She has a 10 pack-year smoking history. She has never used smokeless tobacco. She reports that she does not drink alcohol or use illicit drugs.  Family History  Problem Relation Age of Onset  .  Multiple sclerosis Father   . Alzheimer's disease Mother   . Hypertension Mother   . Diabetes Mother      All the positives are listed in BOLD  Review of Systems:  HEENT: Headache, blurred vision, runny nose, sore throat Neck: Hypothyroidism, hyperthyroidism,,lymphadenopathy Chest : Shortness of breath, history of COPD, Asthma Heart : Chest pain, history of coronary arterey disease GI:  Nausea, vomiting, diarrhea, constipation, GERD GU: Dysuria, urgency, frequency of urination, hematuria Neuro: Stroke, seizures, syncope Psych: Depression, anxiety, hallucinations   Physical Exam: Blood pressure 151/90, pulse 76, temperature 97.7 F (36.5 C), temperature source Oral, resp. rate 20, height 5' (1.524 m), weight 56.7 kg (125 lb), SpO2 96.00%. Constitutional:   Patient is a well-developed and well-nourished female* in no acute distress and cooperative with exam. Head: Normocephalic and atraumatic Mouth: Mucus membranes moist Eyes: PERRL, EOMI, conjunctivae normal Neck: Supple, No Thyromegaly Cardiovascular: RRR, S1 normal, S2 normal Pulmonary/Chest: CTAB, no wheezes, rales, or rhonchi Abdominal: Soft. Mild generalized tenderness to palpation, non-distended, bowel sounds are normal, no masses, organomegaly, or guarding present.  Neurological: A&O x3, Strenght is normal and symmetric bilaterally, cranial nerve II-XII are grossly intact, no focal motor deficit, sensory intact to light touch bilaterally.  Extremities : No Cyanosis, Clubbing or Edema  Labs on Admission:  Basic Metabolic Panel:  Recent Labs Lab 07/15/14 2326  NA 139  K 4.2  CL 99  CO2 28  GLUCOSE 133*  BUN 10  CREATININE 0.85  CALCIUM 9.9   Liver Function Tests:  Recent Labs Lab 07/15/14 2326  AST 14  ALT 7  ALKPHOS 122*  BILITOT 0.6  PROT 8.8*  ALBUMIN 4.3   No results found for this basename: LIPASE, AMYLASE,  in the last 168 hours No results found for this basename: AMMONIA,  in the last 168  hours CBC:  Recent Labs Lab 07/15/14 2326  WBC 10.3  NEUTROABS 7.0  HGB 14.0  HCT 41.8  MCV 98.8  PLT 265   Cardiac Enzymes: No results found for this basename: CKTOTAL, CKMB, CKMBINDEX, TROPONINI,  in the last 168 hours  BNP (last 3 results) No results found for this basename: PROBNP,  in the last 8760 hours CBG: No results found for this basename: GLUCAP,  in the last 168 hours  Radiological Exams on Admission: Ct Abdomen Pelvis Wo Contrast  07/16/2014   CLINICAL DATA:  Constipation.  No bowel movement for 1 week.  EXAM: CT ABDOMEN AND PELVIS WITHOUT CONTRAST  TECHNIQUE: Multidetector CT imaging of the abdomen and pelvis was performed following the standard protocol without IV contrast.  COMPARISON:  09/07/2011  FINDINGS: Lung bases are clear. Coronary artery calcifications. Normal heart size.  Organ evaluation is limited in the absence of intravenous contrast. Within this limitation, rounded hypodensity within the left hepatic lobe is most in keeping with a biliary cyst.  No radiodense gallstones or biliary ductal dilatation. No  appreciable abnormality of the spleen, pancreas, adrenal glands. Upper pole left renal cyst measures water attenuation. Otherwise, symmetric renal attenuation. No urinary tract calculi. No hydroureteronephrosis.  Large stool burden. Rectum and distal sigmoid colon are decompressed. There is mild proximal perisigmoid colonic fat stranding and ill-defined fluid. Tiny locule of air on series 2, image 61 may be within a tiny diverticulum (though none seen here on the prior) or extraluminal.  Appendix not identified. No right lower quadrant inflammation. Small bowel loops are of normal caliber.  No lymphadenopathy.  Scattered atherosclerotic disease of the aorta and branch vessels without aneurysmal dilatation.  Thin walled bladder.  Absent uterus.  No adnexal mass.  No acute osseous finding.  L5-S1 degenerative disc disease.  IMPRESSION: Large proximal colonic stool  burden, focal transition along the proximal sigmoid colon were there is pericolonic fat stranding and ill-defined fluid. This suggests a focal colitis (infectious, inflammatory, or ischemic) causing delayed transit, with underlying mass not excluded. A tiny adjacent locule of air (sagittal image 52) may be extraluminal.  Discussed via telephone with Dr. Bebe Shaggy at 12:25 a.m. on 07/16/2014.   Electronically Signed   By: Jearld Lesch M.D.   On: 07/16/2014 00:30   Dg Abd Acute W/chest  07/15/2014   CLINICAL DATA:  Constipation.  Initial encounter  EXAM: ACUTE ABDOMEN SERIES (ABDOMEN 2 VIEW & CHEST 1 VIEW)  COMPARISON:  10/03/2012  FINDINGS: There is no evidence of bowel obstruction or perforation. Stool volume is within normal limits. No concerning intra-abdominal mass effect or calcification.  Pulmonary hyperinflation with apical emphysematous changes. There is no edema, consolidation, effusion, or pneumothorax. Negative aortic contours. Normal heart size.  IMPRESSION: 1. No evidence of bowel obstruction or perforation. 2. COPD.   Electronically Signed   By: Tiburcio Pea M.D.   On: 07/15/2014 23:02       Assessment/Plan Active Problems:   Colonic obstruction   Colitis  Colitis CT scan shows possible colitis so we'll start the patient on Cipro and Flagyl. Will keep her nothing by mouth, start IV fluids nor was 75 mL per hour. Check CBC and CMP in a.m.  Constipation,? Colonic mass Radiologist is concerned with possible colonic mass on the CT abdomen pelvis. Will get GI consultation in a.m. Will not give any cathartic at this time  ? Tiny amount of extraluminal air on the CT abdomen and pelvis ED physician discussed the CT results with the surgeon on call Dr. Lovell Sheehan who was not impressed with the findings and recommended medical admission with GI consultation in a.m. Patient does not have any peritoneal signs, will keep her nothing by mouth.  Coronary artery disease Will hold aspirin at  this time for possible colonoscopy in a.m.  Hypertension Will continue with Catapres 0.3 mg twice a day, metoprolol 50 mg twice a day  DVT prophylaxis Lovenox  Code status: Patient is DO NOT RESUSCITATE  Family discussion: No family at bedside   Time Spent on Admission: 65 minutes  Brycelynn Stampley S [facility] Pager: [phone] 07/16/2014, 1:28 AM  If 7PM-7AM, please contact night-coverage  [email or web address]  Password [email or web address]

## 2014-07-16 NOTE — ED Provider Notes (Signed)
Pt awake/alert, but she has diffuse abdominal tenderness but no rebound or guarding Discussed CT findings with dr Loraine Leriche Lovell Sheehan He recommends medicine admission and GI consultation D/w dr Sharl Ma, will admit to medicine floor   April Gaskins, MD 07/16/14 0101

## 2014-07-17 ENCOUNTER — Encounter (HOSPITAL_COMMUNITY): Payer: Self-pay | Admitting: *Deleted

## 2014-07-17 ENCOUNTER — Encounter (HOSPITAL_COMMUNITY)
Admission: EM | Disposition: A | Discharge status: Home / Self Care | Payer: Self-pay | Source: Home / Self Care | Attending: Internal Medicine

## 2014-07-17 DIAGNOSIS — K5730 Diverticulosis of large intestine without perforation or abscess without bleeding: Secondary | ICD-10-CM | POA: Diagnosis not present

## 2014-07-17 HISTORY — PX: COLONOSCOPY: SHX5424

## 2014-07-17 SURGERY — COLONOSCOPY
Anesthesia: Moderate Sedation

## 2014-07-17 MED ORDER — MEPERIDINE HCL 100 MG/ML IJ SOLN
INTRAMUSCULAR | Status: AC
  Filled 2014-07-17: qty 2

## 2014-07-17 MED ORDER — ONDANSETRON HCL 4 MG/2ML IJ SOLN
INTRAMUSCULAR | Status: DC | PRN
  Administered 2014-07-17: 4 mg via INTRAVENOUS

## 2014-07-17 MED ORDER — MEPERIDINE HCL 100 MG/ML IJ SOLN
INTRAMUSCULAR | Status: DC | PRN
  Administered 2014-07-17: 50 mg via INTRAVENOUS

## 2014-07-17 MED ORDER — MIDAZOLAM HCL 5 MG/5ML IJ SOLN
INTRAMUSCULAR | Status: DC | PRN
  Administered 2014-07-17: 1 mg via INTRAVENOUS
  Administered 2014-07-17: 2 mg via INTRAVENOUS

## 2014-07-17 MED ORDER — SODIUM CHLORIDE 0.9 % IV SOLN: INTRAVENOUS | Status: DC

## 2014-07-17 MED ORDER — ONDANSETRON HCL 4 MG/2ML IJ SOLN
INTRAMUSCULAR | Status: AC
  Filled 2014-07-17: qty 2

## 2014-07-17 MED ORDER — LINACLOTIDE 145 MCG PO CAPS
145.0000 ug | ORAL_CAPSULE | Freq: Every day | ORAL | Status: DC
  Administered 2014-07-17 – 2014-07-18 (×2): 145 ug via ORAL
  Filled 2014-07-17 (×2): qty 1

## 2014-07-17 MED ORDER — STERILE WATER FOR IRRIGATION IR SOLN
Status: DC | PRN
  Administered 2014-07-17: 16:00:00

## 2014-07-17 MED ORDER — MIDAZOLAM HCL 5 MG/5ML IJ SOLN
INTRAMUSCULAR | Status: AC
  Filled 2014-07-17: qty 10

## 2014-07-17 MED ORDER — POLYETHYLENE GLYCOL 3350 17 G PO PACK
17.0000 g | PACK | Freq: Every day | ORAL | Status: DC
  Administered 2014-07-17 – 2014-07-18 (×2): 17 g via ORAL
  Filled 2014-07-17 (×2): qty 1

## 2014-07-17 NOTE — Progress Notes (Signed)
Subjective: Patient is resting. Complains of intermittent abdominal pain. No nausea or vomiting. No fever or chills. She is schedualed for colonoscopy today.  Objective: Vital signs in last 24 hours: Temp:  [98.5 F (36.9 C)] 98.5 F (36.9 C) (10/27 7412) Pulse Rate:  [66-72] 66 (10/27 0652) Resp:  [20] 20 (10/27 0652) BP: (130-139)/(64-88) 130/64 mmHg (10/27 0652) SpO2:  [100 %] 100 % (10/27 8786) Weight change:  Last BM Date: 07/16/14  Intake/Output from previous day:    PHYSICAL EXAM General appearance: alert and no distress Resp: clear to auscultation bilaterally Cardio: S1, S2 normal GI: soft, non-tender; bowel sounds normal; no masses,  no organomegaly Extremities: extremities normal, atraumatic, no cyanosis or edema  Lab Results:  Results for orders placed during the hospital encounter of 07/15/14 (from the past 48 hour(s))  POC OCCULT BLOOD, ED     Status: Abnormal   Collection Time    07/15/14 11:05 PM      Result Value Ref Range   Fecal Occult Bld POSITIVE (*) NEGATIVE  CBC WITH DIFFERENTIAL     Status: None   Collection Time    07/15/14 11:26 PM      Result Value Ref Range   WBC 10.3  4.0 - 10.5 K/uL   RBC 4.23  3.87 - 5.11 MIL/uL   Hemoglobin 14.0  12.0 - 15.0 g/dL   HCT 41.8  36.0 - 46.0 %   MCV 98.8  78.0 - 100.0 fL   MCH 33.1  26.0 - 34.0 pg   MCHC 33.5  30.0 - 36.0 g/dL   RDW 12.8  11.5 - 15.5 %   Platelets 265  150 - 400 K/uL   Neutrophils Relative % 67  43 - 77 %   Neutro Abs 7.0  1.7 - 7.7 K/uL   Lymphocytes Relative 24  12 - 46 %   Lymphs Abs 2.4  0.7 - 4.0 K/uL   Monocytes Relative 8  3 - 12 %   Monocytes Absolute 0.8  0.1 - 1.0 K/uL   Eosinophils Relative 1  0 - 5 %   Eosinophils Absolute 0.1  0.0 - 0.7 K/uL   Basophils Relative 0  0 - 1 %   Basophils Absolute 0.0  0.0 - 0.1 K/uL  COMPREHENSIVE METABOLIC PANEL     Status: Abnormal   Collection Time    07/15/14 11:26 PM      Result Value Ref Range   Sodium 139  137 - 147 mEq/L    Potassium 4.2  3.7 - 5.3 mEq/L   Chloride 99  96 - 112 mEq/L   CO2 28  19 - 32 mEq/L   Glucose, Bld 133 (*) 70 - 99 mg/dL   BUN 10  6 - 23 mg/dL   Creatinine, Ser 0.85  0.50 - 1.10 mg/dL   Calcium 9.9  8.4 - 10.5 mg/dL   Total Protein 8.8 (*) 6.0 - 8.3 g/dL   Albumin 4.3  3.5 - 5.2 g/dL   AST 14  0 - 37 U/L   ALT 7  0 - 35 U/L   Alkaline Phosphatase 122 (*) 39 - 117 U/L   Total Bilirubin 0.6  0.3 - 1.2 mg/dL   GFR calc non Af Amer 70 (*) >90 mL/min   GFR calc Af Amer 82 (*) >90 mL/min   Comment: (NOTE)     The eGFR has been calculated using the CKD EPI equation.     This calculation has not been validated in  all clinical situations.     eGFR's persistently <90 mL/min signify possible Chronic Kidney     Disease.   Anion gap 12  5 - 15  URINALYSIS, ROUTINE W REFLEX MICROSCOPIC     Status: Abnormal   Collection Time    07/16/14  2:44 AM      Result Value Ref Range   Color, Urine YELLOW  YELLOW   APPearance CLEAR  CLEAR   Specific Gravity, Urine >1.030 (*) 1.005 - 1.030   pH 5.5  5.0 - 8.0   Glucose, UA NEGATIVE  NEGATIVE mg/dL   Hgb urine dipstick NEGATIVE  NEGATIVE   Bilirubin Urine SMALL (*) NEGATIVE   Ketones, ur 15 (*) NEGATIVE mg/dL   Protein, ur 30 (*) NEGATIVE mg/dL   Urobilinogen, UA 1.0  0.0 - 1.0 mg/dL   Nitrite NEGATIVE  NEGATIVE   Leukocytes, UA NEGATIVE  NEGATIVE  URINE MICROSCOPIC-ADD ON     Status: Abnormal   Collection Time    07/16/14  2:44 AM      Result Value Ref Range   Squamous Epithelial / LPF FEW (*) RARE   WBC, UA 0-2  <3 WBC/hpf   RBC / HPF 7-10  <3 RBC/hpf   Bacteria, UA FEW (*) RARE  CBC     Status: Abnormal   Collection Time    07/16/14  6:09 AM      Result Value Ref Range   WBC 11.7 (*) 4.0 - 10.5 K/uL   RBC 4.08  3.87 - 5.11 MIL/uL   Hemoglobin 13.2  12.0 - 15.0 g/dL   HCT 40.6  36.0 - 46.0 %   MCV 99.5  78.0 - 100.0 fL   MCH 32.4  26.0 - 34.0 pg   MCHC 32.5  30.0 - 36.0 g/dL   RDW 12.9  11.5 - 15.5 %   Platelets 296  150 - 400 K/uL   COMPREHENSIVE METABOLIC PANEL     Status: Abnormal   Collection Time    07/16/14  6:09 AM      Result Value Ref Range   Sodium 140  137 - 147 mEq/L   Potassium 4.5  3.7 - 5.3 mEq/L   Chloride 101  96 - 112 mEq/L   CO2 27  19 - 32 mEq/L   Glucose, Bld 148 (*) 70 - 99 mg/dL   BUN 9  6 - 23 mg/dL   Creatinine, Ser 0.69  0.50 - 1.10 mg/dL   Calcium 9.6  8.4 - 10.5 mg/dL   Total Protein 8.3  6.0 - 8.3 g/dL   Albumin 4.1  3.5 - 5.2 g/dL   AST 15  0 - 37 U/L   ALT 7  0 - 35 U/L   Alkaline Phosphatase 118 (*) 39 - 117 U/L   Total Bilirubin 0.6  0.3 - 1.2 mg/dL   GFR calc non Af Amer 89 (*) >90 mL/min   GFR calc Af Amer >90  >90 mL/min   Comment: (NOTE)     The eGFR has been calculated using the CKD EPI equation.     This calculation has not been validated in all clinical situations.     eGFR's persistently <90 mL/min signify possible Chronic Kidney     Disease.   Anion gap 12  5 - 15    ABGS No results found for this basename: PHART, PCO2, PO2ART, TCO2, HCO3,  in the last 72 hours CULTURES No results found for this or any previous visit (from the  past 240 hour(s)). Studies/Results: Ct Abdomen Pelvis Wo Contrast  07/16/2014   CLINICAL DATA:  Constipation.  No bowel movement for 1 week.  EXAM: CT ABDOMEN AND PELVIS WITHOUT CONTRAST  TECHNIQUE: Multidetector CT imaging of the abdomen and pelvis was performed following the standard protocol without IV contrast.  COMPARISON:  09/07/2011  FINDINGS: Lung bases are clear. Coronary artery calcifications. Normal heart size.  Organ evaluation is limited in the absence of intravenous contrast. Within this limitation, rounded hypodensity within the left hepatic lobe is most in keeping with a biliary cyst.  No radiodense gallstones or biliary ductal dilatation. No appreciable abnormality of the spleen, pancreas, adrenal glands. Upper pole left renal cyst measures water attenuation. Otherwise, symmetric renal attenuation. No urinary tract calculi. No  hydroureteronephrosis.  Large stool burden. Rectum and distal sigmoid colon are decompressed. There is mild proximal perisigmoid colonic fat stranding and ill-defined fluid. Tiny locule of air on series 2, image 61 may be within a tiny diverticulum (though none seen here on the prior) or extraluminal.  Appendix not identified. No right lower quadrant inflammation. Small bowel loops are of normal caliber.  No lymphadenopathy.  Scattered atherosclerotic disease of the aorta and branch vessels without aneurysmal dilatation.  Thin walled bladder.  Absent uterus.  No adnexal mass.  No acute osseous finding.  L5-S1 degenerative disc disease.  IMPRESSION: Large proximal colonic stool burden, focal transition along the proximal sigmoid colon were there is pericolonic fat stranding and ill-defined fluid. This suggests a focal colitis (infectious, inflammatory, or ischemic) causing delayed transit, with underlying mass not excluded. A tiny adjacent locule of air (sagittal image 52) may be extraluminal.  Discussed via telephone with Dr. Christy Gentles at 12:25 a.m. on 07/16/2014.   Electronically Signed   By: Carlos Levering M.D.   On: 07/16/2014 00:30   Dg Abd Acute W/chest  07/15/2014   CLINICAL DATA:  Constipation.  Initial encounter  EXAM: ACUTE ABDOMEN SERIES (ABDOMEN 2 VIEW & CHEST 1 VIEW)  COMPARISON:  10/03/2012  FINDINGS: There is no evidence of bowel obstruction or perforation. Stool volume is within normal limits. No concerning intra-abdominal mass effect or calcification.  Pulmonary hyperinflation with apical emphysematous changes. There is no edema, consolidation, effusion, or pneumothorax. Negative aortic contours. Normal heart size.  IMPRESSION: 1. No evidence of bowel obstruction or perforation. 2. COPD.   Electronically Signed   By: Jorje Guild M.D.   On: 07/15/2014 23:02    Medications: I have reviewed the patient's current medications.  Assesment: Active Problems:   Colonic obstruction    Colitis Hypertension CAD Hyperlipedemia   Plan: Medications reviewed Continue IV antibiotics GI consult appreciated coloscopy as planned by GI   LOS: 2 days   Angeligue Bowne 07/17/2014, 7:48 AM

## 2014-07-17 NOTE — H&P (View-Only) (Signed)
Referring Provider: Avon Gully, MD Primary Care Physician:  Avon Gully, MD Primary Gastroenterologist:  Jonette Eva, MD  Reason for Consultation:  Colitis, ?colon mass.  HPI: [name] is a 63 y.o. female presented with complaints of constipation, abdominal pain. History of chronic constipation but had been doing well up until few weeks ago. She stopped Miralax several months ago. Felt like didn't need it. Last good BM almost 2 weeks ago. Started back on Miralax, took almost whole bottle in the past week. Vomited couple of weeks ago. Lost 15 pounds over several weeks. Some brbpr on toilet tissue with straining. Diffuse abd pain. GERD well-controlled.  Heme positive on DRE in ER. No significant fecal impaction per report. Noncontrast CT showed large proximal colonic stool burden, focal transition along the proximal sigmoid colon where there is pericolonic fat stranding and ill-defined fluid. ?tiny adjacent locule of air may be extraluminal.   Two good BM in ER last night.    No prior colonoscopy.    Prior to Admission medications   Medication Sig Start Date End Date Taking? Authorizing Provider  amLODipine (NORVASC) 10 MG tablet Take 10 mg by mouth daily.   Yes Historical Provider, MD  aspirin EC 81 MG tablet Take 81 mg by mouth daily.     Yes Historical Provider, MD  cloNIDine (CATAPRES) 0.3 MG tablet Take 0.3 mg by mouth 2 (two) times daily.   Yes Historical Provider, MD  dextromethorphan-guaiFENesin (MUCINEX DM) 30-600 MG per 12 hr tablet Take 1 tablet by mouth 2 (two) times daily.   Yes Historical Provider, MD  docusate sodium (COLACE) 100 MG capsule Take 100 mg by mouth daily.     Yes Historical Provider, MD  furosemide (LASIX) 20 MG tablet Take 20 mg by mouth daily.   Yes Historical Provider, MD  metoprolol (LOPRESSOR) 50 MG tablet Take 1 tablet (50 mg total) by mouth 2 (two) times daily. 10/26/12  Yes Salley Scarlet, MD  omeprazole (PRILOSEC) 20 MG capsule Take 20 mg by  mouth daily. 03/22/13  Yes West Bali, MD  polyethylene glycol powder (GLYCOLAX/MIRALAX) powder Take 17 g by mouth daily.   Yes Historical Provider, MD  simvastatin (ZOCOR) 40 MG tablet Take 1 tablet (40 mg total) by mouth daily. 10/26/12  Yes Salley Scarlet, MD  traMADol (ULTRAM) 50 MG tablet Take 50 mg by mouth every 6 (six) hours as needed for moderate pain. 10/03/12  Yes Benny Lennert, MD    Current Facility-Administered Medications  Medication Dose Route Frequency Provider Last Rate Last Dose  . 0.9 %  sodium chloride infusion   Intravenous Continuous Meredeth Ide, MD 75 mL/hr at 07/16/14 0253    . acetaminophen (TYLENOL) tablet 650 mg  650 mg Oral Q6H PRN Meredeth Ide, MD       Or  . acetaminophen (TYLENOL) suppository 650 mg  650 mg Rectal Q6H PRN Meredeth Ide, MD      . amLODipine (NORVASC) tablet 10 mg  10 mg Oral Daily Meredeth Ide, MD      . ciprofloxacin (CIPRO) IVPB 400 mg  400 mg Intravenous Q12H Meredeth Ide, MD   400 mg at 07/16/14 0531  . cloNIDine (CATAPRES) tablet 0.3 mg  0.3 mg Oral BID Meredeth Ide, MD   0.3 mg at 07/16/14 0330  . enoxaparin (LOVENOX) injection 40 mg  40 mg Subcutaneous Q24H Meredeth Ide, MD      . metoprolol (LOPRESSOR) tablet 50 mg  50 mg Oral BID Meredeth Ide, MD   50 mg at 07/16/14 0330  . metroNIDAZOLE (FLAGYL) IVPB 500 mg  500 mg Intravenous Q8H Meredeth Ide, MD   500 mg at 07/16/14 0330  . morphine 2 MG/ML injection 2 mg  2 mg Intravenous Q4H PRN Meredeth Ide, MD   2 mg at 07/16/14 0217  . nicotine (NICODERM CQ - dosed in mg/24 hours) patch 21 mg  21 mg Transdermal Daily Meredeth Ide, MD   21 mg at 07/16/14 0329  . ondansetron (ZOFRAN) tablet 4 mg  4 mg Oral Q6H PRN Meredeth Ide, MD       Or  . ondansetron (ZOFRAN) injection 4 mg  4 mg Intravenous Q6H PRN Meredeth Ide, MD        Allergies as of 07/15/2014 - Review Complete 07/15/2014  Allergen Reaction Noted  . Penicillins Anaphylaxis 09/06/2011  . Aspirin Swelling and Other (See Comments)  09/07/2011  . Bee venom Swelling 09/07/2011  . Contrast media [iodinated diagnostic agents] Swelling 09/07/2011  . Dilaudid [hydromorphone hcl] Itching 09/07/2011  . Lisinopril Swelling 09/07/2011  . Motrin [ibuprofen] Swelling 09/07/2011    Past Medical History  Diagnosis Date  . Myocardial infarct     1997, 2004  . Hypertension   . Hyperlipidemia   . CVA (cerebral infarction)   . Severe major depression with psychotic features     2004  . Anxiety   . Blind     right  . GERD (gastroesophageal reflux disease)   . Stroke 2002  . Colitis 09/07/2011    Past Surgical History  Procedure Laterality Date  . Abdominal hysterectomy    . Lumps removed from each breast    . Bilateral salpingoophorectomy    . Coronary angioplasty with stent placement  3838,1840  . Esophagogastroduodenoscopy  09/05/2004    Dr Jimmye Norman gastritis, candida esophagitis  . Cyst removed from left wrist    . Esophagogastroduodenoscopy  09/07/2011    RFV:OHKG, esophageal in the distal esophagus/Mild gastritis    Family History  Problem Relation Age of Onset  . Multiple sclerosis Father   . Alzheimer's disease Mother   . Hypertension Mother   . Diabetes Mother   . Colon cancer Neg Hx     History   Social History  . Marital Status: Widowed    Spouse Name: N/A    Number of Children: 3  . Years of Education: N/A   Occupational History  . disabled, Best boy    Social History Main Topics  . Smoking status: Current Some Day Smoker -- 0.25 packs/day for 40 years    Types: Cigarettes  . Smokeless tobacco: Never Used  . Alcohol Use: No  . Drug Use: No  . Sexual Activity: Not Currently    Birth Control/ Protection: Post-menopausal   Other Topics Concern  . Not on file   Social History Narrative   Lives alone     ROS:  General: no fever. See HPI. Eyes: Negative for vision changes.  ENT: Negative for hoarseness, difficulty swallowing , nasal congestion. CV: Negative for chest  pain, angina, palpitations, dyspnea on exertion, peripheral edema.  Respiratory: Negative for dyspnea at rest, dyspnea on exertion, cough, sputum, wheezing.  GI: See history of present illness. GU:  Negative for dysuria, hematuria, urinary incontinence, urinary frequency, nocturnal urination.  MS: Negative for joint pain, low back pain.  Derm: Negative for rash or itching.  Neuro: Negative for weakness, abnormal sensation, seizure,  frequent headaches, memory loss, confusion.  Psych: Negative for anxiety, depression, suicidal ideation, hallucinations.  Endo: see hpi Heme: Negative for bruising or bleeding. Allergy: Negative for rash or hives.       Physical Examination: Vital signs in last 24 hours: Temp:  [97.7 F (36.5 C)-98.7 F (37.1 C)] 98.7 F (37.1 C) (10/26 0559) Pulse Rate:  [69-76] 69 (10/26 0559) Resp:  [20] 20 (10/26 0559) BP: (135-171)/(52-95) 135/95 mmHg (10/26 0559) SpO2:  [94 %-100 %] 100 % (10/26 0559) Weight:  [117 lb 1 oz (53.1 kg)-125 lb (56.7 kg)] 117 lb 1 oz (53.1 kg) (10/26 0230) Last BM Date: 07/16/14  General: Well-nourished, well-developed in no acute distress.  Head: Normocephalic, atraumatic.   Eyes: Conjunctiva pink, no icterus. Mouth: Oropharyngeal mucosa moist and pink , no lesions erythema or exudate. Neck: Supple without thyromegaly, masses, or lymphadenopathy.  Lungs: Clear to auscultation bilaterally.  Heart: Regular rate and rhythm, no murmurs rubs or gallops.  Abdomen: Bowel sounds are normal, mild mid-lower abd tenderness, nondistended, no hepatosplenomegaly or masses, no abdominal bruits or    hernia , no rebound or guarding.   Rectal: not performed. Done in ER Extremities: No lower extremity edema, clubbing, deformity.  Neuro: Alert and oriented x 4 , grossly normal neurologically.  Skin: Warm and dry, no rash or jaundice.   Psych: Alert and cooperative, normal mood and affect.        Intake/Output from previous day:   Intake/Output  this shift:    Lab Results: CBC  Recent Labs  07/15/14 2326 07/16/14 0609  WBC 10.3 11.7*  HGB 14.0 13.2  HCT 41.8 40.6  MCV 98.8 99.5  PLT 265 296   BMET  Recent Labs  07/15/14 2326 07/16/14 0609  NA 139 140  K 4.2 4.5  CL 99 101  CO2 28 27  GLUCOSE 133* 148*  BUN 10 9  CREATININE 0.85 0.69  CALCIUM 9.9 9.6   LFT  Recent Labs  07/15/14 2326 07/16/14 0609  BILITOT 0.6 0.6  ALKPHOS 122* 118*  AST 14 15  ALT 7 7  PROT 8.8* 8.3  ALBUMIN 4.3 4.1    Lipase No results found for this basename: LIPASE,  in the last 72 hours  PT/INR No results found for this basename: LABPROT, INR,  in the last 72 hours    Imaging Studies: Ct Abdomen Pelvis Wo Contrast  07/16/2014   CLINICAL DATA:  Constipation.  No bowel movement for 1 week.  EXAM: CT ABDOMEN AND PELVIS WITHOUT CONTRAST  TECHNIQUE: Multidetector CT imaging of the abdomen and pelvis was performed following the standard protocol without IV contrast.  COMPARISON:  09/07/2011  FINDINGS: Lung bases are clear. Coronary artery calcifications. Normal heart size.  Organ evaluation is limited in the absence of intravenous contrast. Within this limitation, rounded hypodensity within the left hepatic lobe is most in keeping with a biliary cyst.  No radiodense gallstones or biliary ductal dilatation. No appreciable abnormality of the spleen, pancreas, adrenal glands. Upper pole left renal cyst measures water attenuation. Otherwise, symmetric renal attenuation. No urinary tract calculi. No hydroureteronephrosis.  Large stool burden. Rectum and distal sigmoid colon are decompressed. There is mild proximal perisigmoid colonic fat stranding and ill-defined fluid. Tiny locule of air on series 2, image 61 may be within a tiny diverticulum (though none seen here on the prior) or extraluminal.  Appendix not identified. No right lower quadrant inflammation. Small bowel loops are of normal caliber.  No lymphadenopathy.  Scattered  atherosclerotic disease of the aorta and branch vessels without aneurysmal dilatation.  Thin walled bladder.  Absent uterus.  No adnexal mass.  No acute osseous finding.  L5-S1 degenerative disc disease.  IMPRESSION: Large proximal colonic stool burden, focal transition along the proximal sigmoid colon were there is pericolonic fat stranding and ill-defined fluid. This suggests a focal colitis (infectious, inflammatory, or ischemic) causing delayed transit, with underlying mass not excluded. A tiny adjacent locule of air (sagittal image 52) may be extraluminal.  Discussed via telephone with Dr. Bebe ShaggyWickline at 12:25 a.m. on 07/16/2014.   Electronically Signed By: Jearld LeschAndrew  DelGaizo M.D.   On: 07/16/2014 00:30   Dg Abd Acute W/chest  07/15/2014   CLINICAL DATA:  Constipation.  Initial encounter  EXAM: ACUTE ABDOMEN SERIES (ABDOMEN 2 VIEW & CHEST 1 VIEW)  COMPARISON:  10/03/2012  FINDINGS: There is no evidence of bowel obstruction or perforation. Stool volume is within normal limits. No concerning intra-abdominal mass effect or calcification.  Pulmonary hyperinflation with apical emphysematous changes. There is no edema, consolidation, effusion, or pneumothorax. Negative aortic contours. Normal heart size.  IMPRESSION: 1. No evidence of bowel obstruction or perforation. 2. COPD.   Electronically Signed   By: Tiburcio PeaJonathan  Watts M.D.   On: 07/15/2014 23:02  [4 week]   Impression: 63 y/o lady with baseline constipation who presented with 2 week history of worsening symptoms. No good BM in this period of time. DRE showed heme + stool but no fecal impaction. CT showed large proximal colonic stool burden with decompression in rectum/distal sigmoid. Stranding noted around the proximal sigmoid region. ?air within tiny diverticulum vs extraluminal. Possible focal colitis but underlying mass not excluded. CT films reviewed by Dr. Jena Gaussourk with radiologist, felt to be no significant inflammation in stool noted throughout the colon,  no real transition point. Patient has been seen briefly by Dr. Lovell SheehanJenkins and no need for acute surgical intervention at this time. Mild bump in WBC today. Patient currently on Cipro/Flagyl.   Discussed with Dr. Jena Gaussourk. He recommends colonoscopy this admission. Discussed with patient and she is agreeable at this time. Will start colonic purge today. She reports that she has had several large soft BMs since admission.  I have discussed the risks, alternatives, benefits with regards to but not limited to the risk of reaction to medication, bleeding, infection, perforation and the patient is agreeable to proceed. Written consent to be obtained.   Plan: 1. Colonoscopy tomorrow. 2. Lovenox discontinued in preparation for procedure today. SCDs ordered.   We would like to thank you for the opportunity to participate in the care of [name].    LOS: 1 day   Tana CoastLeslie Lewis  07/16/2014, 7:54 AM  Attending note:  Patient seen and examined. Agree with above assessment and recommendations.

## 2014-07-17 NOTE — Progress Notes (Signed)
Pt. Requesting a diet.  Notified MD.  Received order to give patient diet that she was previously on.  Will continue to monitor patient.

## 2014-07-17 NOTE — Interval H&P Note (Signed)
History and Physical Interval Note:  07/17/2014 4:00 PM  [name]  has presented today for surgery, with the diagnosis of abnormal colon CT. constipation/obstipation  The various methods of treatment have been discussed with the patient and family. After consideration of risks, benefits and other options for treatment, the patient has consented to  Procedure(s): COLONOSCOPY (N/A) as a surgical intervention .  The patient's history has been reviewed, patient examined, no change in status, stable for surgery.  I have reviewed the patient's chart and labs.  Questions were answered to the patient's satisfaction.     Json Koelzer  Change. Colonoscopy per plan.  The risks, benefits, limitations, alternatives and imponderables have been reviewed with the patient. Questions have been answered. All parties are agreeable.

## 2014-07-17 NOTE — Care Management Note (Signed)
    Page 1 of 1   07/18/2014     11:28:19 AM CARE MANAGEMENT NOTE 07/18/2014  Patient:  [name], [name]   Account Number:  [id number]  Date Initiated:  07/17/2014  Documentation initiated by:  Anibal Henderson  Subjective/Objective Assessment:   Admitted with colonic obstruction, colitis. Pt is from home alone, with family support, and will be returning home at D/C     Action/Plan:   No needs identified   Anticipated DC Date:  07/18/2014   Anticipated DC Plan:  HOME/SELF CARE         Choice offered to / List presented to:             Status of service:  Completed, signed off Medicare Important Message given?  YES (If response is "NO", the following Medicare IM given date fields will be blank) Date Medicare IM given:  07/18/2014 Medicare IM given by:  Anibal Henderson Date Additional Medicare IM given:   Additional Medicare IM given by:    Discharge Disposition:  HOME/SELF CARE  Per UR Regulation:  Reviewed for med. necessity/level of care/duration of stay  If discussed at Long Length of Stay Meetings, dates discussed:    Comments:  07/18/14 1030 [facility] RN/CM pt denies needs, just " ready to get out of here"! 06/2714 1645 Anibal Henderson RN/CM

## 2014-07-17 NOTE — Op Note (Signed)
[facility] [address] [place], [address]   COLONOSCOPY PROCEDURE REPORT  PATIENT: [name], [name]  MR#: [id number] BIRTHDATE: April 29, 1951 , 63  yrs. old GENDER: female ENDOSCOPIST: R.  Roetta Sessions, MD FACP [facility] REFERRED [id number] Felecia Shelling, M.D. PROCEDURE DATE:  07-31-14 PROCEDURE:   Colonoscopy, diagnostic INDICATIONS:abnormal colon on CT; Hemoccult positive stool. MEDICATIONS: Versed 4 mg IV and Demerol 50 mg IV in divided doses. Zofran 4 mg IV. ASA CLASS:       Class II  CONSENT: The risks, benefits, alternatives and imponderables including but not limited to bleeding, perforation as well as the possibility of a missed lesion have been reviewed.  The potential for biopsy, lesion removal, etc. have also been discussed. Questions have been answered.  All parties agreeable.  Please see the history and physical in the medical record for more information.  DESCRIPTION OF PROCEDURE:   After the risks benefits and alternatives of the procedure were thoroughly explained, informed consent was obtained.  The digital rectal exam revealed no abnormalities of the rectum.   The EC-[id number] ([id number])  endoscope was introduced through the anus and advanced to the cecum, which was identified by both the appendix and ileocecal valve. No adverse events experienced.   The quality of the prep was Inadequate The instrument was then slowly withdrawn as the colon was fully examined.      COLON FINDINGS: Grossly normal-appearing rectum however prep poor which precluded thorough examination of all the mucosa.  Grossly redundant colon scattered left-sided diverticula; patient's position and external abdominal pressure had to be employed several times to reach the cecum.  The colonic because otherwise appeared grossly normal however a small or flat lesion may have not been seen today because of the poor prep.  Certainly, patient had no evidence of gross neoplasm or  stricture.  Retroflexed views revealed no abnormalities. .  Withdrawal time=8 minutes 0 seconds.  The scope was withdrawn and the procedure completed. COMPLICATIONS: There were no immediate complications.  ENDOSCOPIC IMPRESSION: Extremely redundant colon.  Colonic diverticulosis.  Inadequate preparation precluded examination of all the rectal and colonic mucosal surfaces. No evidence of stricture or gross colonic neoplasm. I suspect slow colonic transit in the setting of a markedly redundant colon.  RECOMMENDATIONS: Linzess 145 daily; MiraLAX 17 g orally daily.  Advance diet.  Hopefully, home soonn.  Follow-up Dr.  Darrick Penna.  I would recommend a repeat colonoscopy in the setting of a better prep in 6 months to a year for standard colorectal cancer screening.  eSigned:  R. Roetta Sessions, MD Jerrel Ivory [facility] 2014/07/31 4:48 PM   cc:  CPT CODES: ICD CODES:  The ICD and CPT codes recommended by this software are interpretations from the data that the clinical staff has captured with the software.  The verification of the translation of this report to the ICD and CPT codes and modifiers is the sole responsibility of the health care institution and practicing physician where this report was generated.  [employer] will not be held responsible for the validity of the ICD and CPT codes included on this report.  AMA assumes no liability for data contained or not contained herein. CPT is a Publishing rights manager of the [employer].  PATIENT NAME:  [name], [name] MR#: [id number]

## 2014-07-18 ENCOUNTER — Encounter: Payer: Self-pay | Admitting: Gastroenterology

## 2014-07-18 ENCOUNTER — Telehealth: Payer: Self-pay | Admitting: Gastroenterology

## 2014-07-18 MED ORDER — LINACLOTIDE 145 MCG PO CAPS: 145.0000 ug | ORAL_CAPSULE | Freq: Every day | ORAL | Status: DC

## 2014-07-18 NOTE — Progress Notes (Signed)
Patient IV remove skin clean dry and intact. Patient stable at discharge. Son and patient received discharge instruction. No questions noted. Patient escorted to car via wheelchair by Nurse tech

## 2014-07-18 NOTE — Discharge Summary (Signed)
Physician Discharge Summary  Patient ID: [name] MRN: [id number] DOB/AGE: 65/09/1950 65 y.o. Primary Care Physician:Avril Busser, MD Admit date: 07/15/2014 Discharge date: 07/18/2014    Discharge Diagnoses:   Active Problems:   Colonic obstruction   Colitis colonic diverticulosis Redundant colon Hypertension  CAD  Hyperlipedemia        Medication List         amLODipine 10 MG tablet  Commonly known as:  NORVASC  Take 10 mg by mouth daily.     aspirin EC 81 MG tablet  Take 81 mg by mouth daily.     cloNIDine 0.3 MG tablet  Commonly known as:  CATAPRES  Take 0.3 mg by mouth 2 (two) times daily.     dextromethorphan-guaiFENesin 30-600 MG per 12 hr tablet  Commonly known as:  MUCINEX DM  Take 1 tablet by mouth 2 (two) times daily.     docusate sodium 100 MG capsule  Commonly known as:  COLACE  Take 100 mg by mouth daily.     furosemide 20 MG tablet  Commonly known as:  LASIX  Take 20 mg by mouth daily.     Linaclotide 145 MCG Caps capsule  Commonly known as:  LINZESS  Take 1 capsule (145 mcg total) by mouth daily.     metoprolol 50 MG tablet  Commonly known as:  LOPRESSOR  Take 1 tablet (50 mg total) by mouth 2 (two) times daily.     omeprazole 20 MG capsule  Commonly known as:  PRILOSEC  Take 20 mg by mouth daily.     polyethylene glycol powder powder  Commonly known as:  GLYCOLAX/MIRALAX  Take 17 g by mouth daily.     simvastatin 40 MG tablet  Commonly known as:  ZOCOR  Take 1 tablet (40 mg total) by mouth daily.     traMADol 50 MG tablet  Commonly known as:  ULTRAM  Take 50 mg by mouth every 6 (six) hours as needed for moderate pain.        Discharged Condition: stable    Consults: GI  Significant Diagnostic Studies: Ct Abdomen Pelvis Wo Contrast  07/16/2014   CLINICAL DATA:  Constipation.  No bowel movement for 1 week.  EXAM: CT ABDOMEN AND PELVIS WITHOUT CONTRAST  TECHNIQUE: Multidetector CT imaging of the abdomen and  pelvis was performed following the standard protocol without IV contrast.  COMPARISON:  09/07/2011  FINDINGS: Lung bases are clear. Coronary artery calcifications. Normal heart size.  Organ evaluation is limited in the absence of intravenous contrast. Within this limitation, rounded hypodensity within the left hepatic lobe is most in keeping with a biliary cyst.  No radiodense gallstones or biliary ductal dilatation. No appreciable abnormality of the spleen, pancreas, adrenal glands. Upper pole left renal cyst measures water attenuation. Otherwise, symmetric renal attenuation. No urinary tract calculi. No hydroureteronephrosis.  Large stool burden. Rectum and distal sigmoid colon are decompressed. There is mild proximal perisigmoid colonic fat stranding and ill-defined fluid. Tiny locule of air on series 2, image 61 may be within a tiny diverticulum (though none seen here on the prior) or extraluminal.  Appendix not identified. No right lower quadrant inflammation. Small bowel loops are of normal caliber.  No lymphadenopathy.  Scattered atherosclerotic disease of the aorta and branch vessels without aneurysmal dilatation.  Thin walled bladder.  Absent uterus.  No adnexal mass.  No acute osseous finding.  L5-S1 degenerative disc disease.  IMPRESSION: Large proximal colonic stool burden, focal transition along the proximal  sigmoid colon were there is pericolonic fat stranding and ill-defined fluid. This suggests a focal colitis (infectious, inflammatory, or ischemic) causing delayed transit, with underlying mass not excluded. A tiny adjacent locule of air (sagittal image 52) may be extraluminal.  Discussed via telephone with Dr. Bebe Shaggy at 12:25 a.m. on 07/16/2014.   Electronically Signed   By: Jearld Lesch M.D.   On: 07/16/2014 00:30   Dg Abd Acute W/chest  07/15/2014   CLINICAL DATA:  Constipation.  Initial encounter  EXAM: ACUTE ABDOMEN SERIES (ABDOMEN 2 VIEW & CHEST 1 VIEW)  COMPARISON:  10/03/2012   FINDINGS: There is no evidence of bowel obstruction or perforation. Stool volume is within normal limits. No concerning intra-abdominal mass effect or calcification.  Pulmonary hyperinflation with apical emphysematous changes. There is no edema, consolidation, effusion, or pneumothorax. Negative aortic contours. Normal heart size.  IMPRESSION: 1. No evidence of bowel obstruction or perforation. 2. COPD.   Electronically Signed   By: Tiburcio Pea M.D.   On: 07/15/2014 23:02    Lab Results: Basic Metabolic Panel:  Recent Labs  10/93/23 2326 07/16/14 0609  NA 139 140  K 4.2 4.5  CL 99 101  CO2 28 27  GLUCOSE 133* 148*  BUN 10 9  CREATININE 0.85 0.69  CALCIUM 9.9 9.6   Liver Function Tests:  Recent Labs  07/15/14 2326 07/16/14 0609  AST 14 15  ALT 7 7  ALKPHOS 122* 118*  BILITOT 0.6 0.6  PROT 8.8* 8.3  ALBUMIN 4.3 4.1     CBC:  Recent Labs  07/15/14 2326 07/16/14 0609  WBC 10.3 11.7*  NEUTROABS 7.0  --   HGB 14.0 13.2  HCT 41.8 40.6  MCV 98.8 99.5  PLT 265 296    No results found for this or any previous visit (from the past 240 hour(s)).   Hospital Course:  This is a 63 years old female with history of multiple medical illnesses was admitted due to constipation and abdominal pain. Patient was seen by GI and colonoscopy was done and it showed exteremly redundant with diverticulosis. No mass or inflammation. Patient is advised to continue on miralax and started linzess 145 mg po daily. Patient is being discharged in stable condition.    Discharge Exam: Blood pressure 122/61, pulse 84, temperature 98.1 F (36.7 C), temperature source Oral, resp. rate 20, height 5' (1.524 m), weight 53.1 kg (117 lb 1 oz), SpO2 96.00%.   Disposition:  home        Follow-up Information   Follow up with [facility], MD In 2 weeks.   Specialty:  Internal Medicine   Contact information:   [address] [place] [address] [phone]        Signed: Avon Gully   07/18/2014, 7:34 AM

## 2014-07-18 NOTE — Telephone Encounter (Signed)
Patient to be discharged today. Please arrange f/u appt with [place] in about 2-4 weeks.   Also, I have told her she can pick up samples of Linzess 145 mcg from our office to try in the interim. Please have this ready for her; she will probably stop by in the next few days.

## 2014-07-18 NOTE — Telephone Encounter (Signed)
Samples are at the front desk for pt to pick up.  Misty Stanley, please schedule pt.

## 2014-07-18 NOTE — Telephone Encounter (Signed)
PATIENT SCHEDULED, LETTER SENT

## 2014-07-19 ENCOUNTER — Encounter (HOSPITAL_COMMUNITY): Payer: Self-pay | Admitting: Internal Medicine

## 2014-07-20 NOTE — ED Provider Notes (Signed)
Medical screening examination/treatment/procedure(s) were conducted as a shared visit with non-physician practitioner(s) and myself.  I personally evaluated the patient during the encounter.   EKG Interpretation None        Joya Gaskins, MD 07/20/14 1410

## 2014-08-01 ENCOUNTER — Telehealth: Payer: Self-pay | Admitting: Gastroenterology

## 2014-08-01 NOTE — Telephone Encounter (Signed)
Pt called asking to leave message for AS.  She said that she was given Linzess and it will cost her over $300 to fill at the pharmacy and is there anything else she can try that isn't so expensive/ Please advise. [phone] it the number she gave, but caller ID had [phone]

## 2014-08-02 MED ORDER — LUBIPROSTONE 8 MCG PO CAPS: 8.0000 ug | ORAL_CAPSULE | Freq: Two times a day (BID) | ORAL | Status: DC

## 2014-08-02 NOTE — Telephone Encounter (Signed)
I called pt. She said she could not afford the Linzess. Dr. Felecia Shelling gave her a few samples for now. Please advise if anything else is recommended.

## 2014-08-02 NOTE — Telephone Encounter (Signed)
I have sent in Amitiza po BID. May provide samples.

## 2014-08-03 NOTE — Telephone Encounter (Signed)
Tried to call x 2 and phone will not ring. ( No samples of Amitiza 8 mcg available at this time).

## 2014-08-08 NOTE — Telephone Encounter (Signed)
Tried to call. Line busy.Sending letter that RX was sent to the pharmacy and to call with questions.

## 2014-08-21 ENCOUNTER — Encounter: Payer: Self-pay | Admitting: Gastroenterology

## 2014-08-21 ENCOUNTER — Ambulatory Visit (INDEPENDENT_AMBULATORY_CARE_PROVIDER_SITE_OTHER): Payer: Medicare Other | Admitting: Nurse Practitioner

## 2014-08-21 VITALS — BP 137/77 | HR 79 | Temp 98.3°F | Ht 60.0 in | Wt 118.0 lb

## 2014-08-21 DIAGNOSIS — K5904 Chronic idiopathic constipation: Secondary | ICD-10-CM

## 2014-08-21 DIAGNOSIS — K5909 Other constipation: Secondary | ICD-10-CM

## 2014-08-21 NOTE — Patient Instructions (Addendum)
1. Start taking the Amitiza (at your pharmacy), let us know if there's any problems with this medicine or if it is too expensive.  2. Take the Amitiza with a full meal to prevent nausea. 3. Return for a follow-up visit in 3 months so we can check how you're doing on the new medicine.

## 2014-08-21 NOTE — Assessment & Plan Note (Addendum)
Constipation symptoms generally resolved, doing well on Linzess however it is too expensive for her to afford. Stopped Linzess, start Amitiza 8 mcg, copay card provided. Return in 3 months for follow-up or prn for any problems.  Patient self-report of mild weight loss (4 lbs in one month) but without any concerning or red flag signs/symptoms. Likely inconsequential. Continue to monitor.

## 2014-08-21 NOTE — Progress Notes (Signed)
Referring Provider: Avon Gully, MD Primary Care Physician:  Avon Gully, MD Primary GI: Dr. Darrick Penna  Chief Complaint  Patient presents with  . Follow-up    HPI:   63 year old female presents for hospital follow-up. Was admitted to the hospital 07/15/14 for abdominal pain and constipation along with decreased appetite. CT in the ER showed large proximal colonic stool burden, focal colitis causing delayed transit with underlying mass not excluded. Also tiny adjacent locule of air was noted. Inpatient colonoscopy done by Dr. Jena Gauss found poor prep, grossly redundant colon and scattered left-sided diverticula, otherwise grossly normal colon without evidence of gross neoplasm or stricture. Patient was discharged and started on Linzess 145 daily. She called the office post-discharge to say she couldn't afford the medication and had been using samples. A prescription for Amitiza 8 mcg was sent to the pharmacy.  Since then she states she's been losing weight, approximately 4 lb in one month. States "the minute I put something in my stomach I'm running to go to the bathroom" up to 2-3 times a day, described as not diarrhea but not a full bowel movement. Denies dysphagia, N/V/D, constipation. Has occasional abdominal discomfort when she hasn't moved her bowels in a couple days and is relieved when she has a bowel movement. This happens very rarely. Denies hematochezia and melena. GERD symptoms well controlled on omeprazole. Deneis any other GI symptoms.    Past Medical History  Diagnosis Date  . Myocardial infarct     1997, 2004  . Hypertension   . Hyperlipidemia   . CVA (cerebral infarction)   . Severe major depression with psychotic features     2004  . Anxiety   . Blind     right  . GERD (gastroesophageal reflux disease)   . Stroke 2002  . Colitis 09/07/2011    Past Surgical History  Procedure Laterality Date  . Abdominal hysterectomy    . Lumps removed from each breast    .  Bilateral salpingoophorectomy    . Coronary angioplasty with stent placement  9211,9417  . Esophagogastroduodenoscopy  09/05/2004    Dr Jimmye Norman gastritis, candida esophagitis  . Cyst removed from left wrist    . Esophagogastroduodenoscopy  09/07/2011    EYC:XKGY, esophageal in the distal esophagus/Mild gastritis  . Colonoscopy N/A 07/17/2014    Procedure: COLONOSCOPY;  Surgeon: Corbin Ade, MD;  Location: [facility];  Service: Endoscopy;  Laterality: N/A;    Current Outpatient Prescriptions  Medication Sig Dispense Refill  . amLODipine (NORVASC) 10 MG tablet Take 10 mg by mouth daily.    Marland Kitchen aspirin EC 81 MG tablet Take 81 mg by mouth daily.      . cloNIDine (CATAPRES) 0.3 MG tablet Take 0.3 mg by mouth 2 (two) times daily.    Marland Kitchen dextromethorphan-guaiFENesin (MUCINEX DM) 30-600 MG per 12 hr tablet Take 1 tablet by mouth 2 (two) times daily.    Marland Kitchen docusate sodium (COLACE) 100 MG capsule Take 100 mg by mouth daily.      . furosemide (LASIX) 20 MG tablet Take 20 mg by mouth daily.    . Linaclotide (LINZESS) 145 MCG CAPS capsule Take 1 capsule (145 mcg total) by mouth daily. 30 capsule 3  . metoprolol (LOPRESSOR) 50 MG tablet Take 1 tablet (50 mg total) by mouth 2 (two) times daily. 60 tablet 3  . omeprazole (PRILOSEC) 20 MG capsule Take 20 mg by mouth daily.    . polyethylene glycol powder (GLYCOLAX/MIRALAX) powder Take  17 g by mouth daily.    . simvastatin (ZOCOR) 40 MG tablet Take 1 tablet (40 mg total) by mouth daily. 30 tablet 3  . traMADol (ULTRAM) 50 MG tablet Take 50 mg by mouth every 6 (six) hours as needed for moderate pain.    Marland Kitchen. lubiprostone (AMITIZA) 8 MCG capsule Take 1 capsule (8 mcg total) by mouth 2 (two) times daily with a meal. (Patient not taking: Reported on 08/21/2014) 60 capsule 3   No current facility-administered medications for this visit.    Allergies as of 08/21/2014 - Review Complete 08/21/2014  Allergen Reaction Noted  . Penicillins Anaphylaxis  09/06/2011  . Aspirin Swelling and Other (See Comments) 09/07/2011  . Bee venom Swelling 09/07/2011  . Contrast media [iodinated diagnostic agents] Swelling 09/07/2011  . Dilaudid [hydromorphone hcl] Itching 09/07/2011  . Lisinopril Swelling 09/07/2011  . Motrin [ibuprofen] Swelling 09/07/2011    Family History  Problem Relation Age of Onset  . Multiple sclerosis Father   . Alzheimer's disease Mother   . Hypertension Mother   . Diabetes Mother   . Colon cancer Neg Hx     History   Social History  . Marital Status: Widowed    Spouse Name: N/A    Number of Children: 3  . Years of Education: N/A   Occupational History  . 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 Smoking status: Current Some Day Smoker -- 0.25 packs/day for 40 years    Types: Cigarettes  . Smokeless tobacco: Never Used  . Alcohol Use: No  . Drug Use: No  . Sexual Activity: Not Currently    Birth Control/ Protection: Post-menopausal   Other Topics Concern  . Not on file   Social History Narrative   Lives alone    Review of Systems: Gen: Denies fever, chills, anorexia. Denies fatigue, weakness.  CV: Denies chest pain, palpitations. Resp: Denies dyspnea at rest, cough, wheezing. GI: See HPI. Denies vomiting blood, and fecal incontinence.   Denies dysphagia or odynophagia. Derm: Denies rash, itching, dry skin Psych: Denies depression, anxiety, memory loss, confusion. Heme: Denies bruising, bleeding, and enlarged lymph nodes.  Physical Exam: BP 137/77 mmHg  Pulse 79  Temp(Src) 98.3 F (36.8 C) (Oral)  Ht 5' (1.524 m)  Wt 118 lb (53.524 kg)  BMI 23.05 kg/m2 General:   Alert and oriented. No distress noted. Pleasant and cooperative.  Head:  Normocephalic and atraumatic. Eyes:  Conjuctiva clear without scleral icterus. Neck:  Supple, without mass or thyromegaly.  Heart:  S1, S2 present without murmurs, rubs, or gallops. Regular rate and rhythm. Abdomen:  +BS, soft, non-tender and  non-distended. No rebound or guarding. No HSM or masses noted. Msk:  Symmetrical without gross deformities. Normal posture. Extremities:  Without edema. Neurologic:  Alert and  oriented x4;  grossly normal neurologically. Skin:  Intact without significant lesions or rashes. Psych:  Alert and cooperative. Normal mood and affect.    08/21/2014 1:37 PM

## 2014-10-16 ENCOUNTER — Other Ambulatory Visit: Payer: Self-pay | Admitting: Gastroenterology

## 2014-10-16 ENCOUNTER — Telehealth: Payer: Self-pay | Admitting: Gastroenterology

## 2014-10-16 MED ORDER — OMEPRAZOLE 20 MG PO CPDR: 20.0000 mg | DELAYED_RELEASE_CAPSULE | Freq: Every day | ORAL | Status: DC

## 2014-10-16 NOTE — Addendum Note (Signed)
Addended by: Nira Retort on: 10/16/2014 03:04 PM   Modules accepted: Orders

## 2014-10-16 NOTE — Telephone Encounter (Signed)
Routing to refill box  

## 2014-10-16 NOTE — Telephone Encounter (Signed)
Completed.

## 2014-10-16 NOTE — Telephone Encounter (Signed)
Pt called asking for a refill on her omeprazole 20 mg. She uses [facility] in [place]. I told her multiple times to have the pharmacy fax [place] a refill request but she said that she needed it done now and she wasn't calling the pharmacy. She also said that she wanted someone to call her back once it was done.[phone]

## 2014-11-20 ENCOUNTER — Ambulatory Visit: Payer: Medicare Other | Admitting: Gastroenterology

## 2014-11-26 ENCOUNTER — Ambulatory Visit: Payer: Medicare Other | Admitting: Gastroenterology

## 2015-05-07 ENCOUNTER — Other Ambulatory Visit (HOSPITAL_COMMUNITY): Payer: Self-pay | Admitting: Internal Medicine

## 2015-05-07 ENCOUNTER — Encounter: Payer: Self-pay | Admitting: Gastroenterology

## 2015-05-07 DIAGNOSIS — Z1231 Encounter for screening mammogram for malignant neoplasm of breast: Secondary | ICD-10-CM

## 2015-05-09 ENCOUNTER — Ambulatory Visit (HOSPITAL_COMMUNITY): Payer: Medicare Other

## 2015-05-30 ENCOUNTER — Ambulatory Visit (INDEPENDENT_AMBULATORY_CARE_PROVIDER_SITE_OTHER): Payer: PPO | Admitting: Gastroenterology

## 2015-05-30 ENCOUNTER — Encounter: Payer: Self-pay | Admitting: Gastroenterology

## 2015-05-30 VITALS — BP 122/73 | HR 74 | Temp 98.1°F | Ht 60.0 in | Wt 122.4 lb

## 2015-05-30 DIAGNOSIS — K5904 Chronic idiopathic constipation: Secondary | ICD-10-CM

## 2015-05-30 DIAGNOSIS — K5909 Other constipation: Secondary | ICD-10-CM

## 2015-05-30 DIAGNOSIS — Z1211 Encounter for screening for malignant neoplasm of colon: Secondary | ICD-10-CM

## 2015-05-30 MED ORDER — LUBIPROSTONE 24 MCG PO CAPS: 24.0000 ug | ORAL_CAPSULE | Freq: Two times a day (BID) | ORAL | Status: DC

## 2015-05-30 NOTE — Progress Notes (Signed)
Subjective:    Patient ID: [name], female    DOB: 1951-03-21, 64 y.o.   MRN: [id number]  [name],TESFAYE, MD  HPI LAST OPV DEC 2015. HAS TO GO HAVE BM BY USING MIRALAX once daily in 630pm, & Colace in am. TAKING LINZESS WHEN IT'S AVAILABLE. HAS TROUBLE GETTING IT BECAUSE IT COSTS $339.12. AMITIZA COSTS JUST AS MUCH ALMOST($289.00??). BM DAILY BUT IT COMES OUT IN SOFT BLOBS AND LITTLE SNAKES. BUTT MAY FEEL RAW. HAS CHEST PAIN/SOBB OFF AND  ON BUT NO WROSE THAN USUAL. ABDOMINAL PAIN WHEN SHE GETS REAL BACKED.  PT DENIES FEVER, CHILLS, HEMATOCHEZIA, nausea, vomiting, melena, diarrhea, CHANGE IN BOWEL IN HABITS, problems swallowing, problems with sedation, OR heartburn or indigestion.  Past Medical History  Diagnosis Date  . Myocardial infarct     1997, 2004  . Hypertension   . Hyperlipidemia   . CVA (cerebral infarction)   . Severe major depression with psychotic features     2004  . Anxiety   . Blind     right  . GERD (gastroesophageal reflux disease)   . Stroke 2002  . Colitis 09/07/2011   Past Surgical History  Procedure Laterality Date  . Abdominal hysterectomy    . Lumps removed from each breast    . Bilateral salpingoophorectomy    . Coronary angioplasty with stent placement  7253,6644  . Esophagogastroduodenoscopy  09/05/2004    Dr Jimmye Norman gastritis, candida esophagitis  . Cyst removed from left wrist    . Esophagogastroduodenoscopy  09/07/2011    IHK:VQQV, esophageal in the distal esophagus/Mild gastritis  . Colonoscopy N/A 07/17/2014    RMR: Extremely redundant colon. Colonic diverticulosis. Inadequate prepartation precluded examination of all the rectal and colonic neoplasm. I suspect slow colonic transit in the setting of a markely redundant colon.   Allergies  Allergen Reactions  . Penicillins Anaphylaxis  . Aspirin Swelling and Other (See Comments)    Only in large doses will cause a reaction  . Bee Venom Swelling    Severe swelling  .  Contrast Media [Iodinated Diagnostic Agents] Swelling  . Dilaudid [Hydromorphone Hcl] Itching  . Lisinopril Swelling  . Motrin [Ibuprofen] Swelling    Current Outpatient Prescriptions  Medication Sig Dispense Refill  . amLODipine (NORVASC) 10 MG tablet Take 10 mg by mouth daily.    Marland Kitchen aspirin EC 81 MG tablet Take 81 mg by mouth daily.      . cloNIDine (CATAPRES) 0.3 MG tablet Take 0.3 mg by mouth 2 (two) times daily.    Marland Kitchen dextromethorphan-guaiFENesin (MUCINEX DM) 30-600 MG per 12 hr tablet Take 1 tablet by mouth 2 (two) times daily.    Marland Kitchen docusate sodium (COLACE) 100 MG capsule Take 100 mg by mouth daily.      . furosemide (LASIX) 20 MG tablet Take 20 mg by mouth daily.    . Linaclotide (LINZESS) 145 MCG CAPS capsule Take 1 capsule (145 mcg total) by mouth daily. PRN   . metoprolol (LOPRESSOR) 50 MG tablet Take 1 tablet (50 mg total) by mouth 2 (two) times daily.    Marland Kitchen omeprazole (PRILOSEC) 20 MG capsule TAKE 1 CAPSULE BY MOUTH EVERY DAY    . polyethylene glycol powder (GLYCOLAX/MIRALAX) powder Take 17 g by mouth daily.    . simvastatin (ZOCOR) 40 MG tablet Take 1 tablet (40 mg total) by mouth daily.    . traMADol (ULTRAM) 50 MG tablet Take 50 mg by mouth every 6 (six) hours as needed for moderate pain.    Marland Kitchen      Marland Kitchen  Review of Systems PER HPI OTHERWISE ALL SYSTEMS ARE NEGATIVE.     Objective:   Physical Exam  Constitutional: She is oriented to person, place, and time. She appears well-developed and well-nourished. No distress.  HENT:  Head: Normocephalic and atraumatic.  Mouth/Throat: Oropharynx is clear and moist. No oropharyngeal exudate.  Eyes: Pupils are equal, round, and reactive to light. No scleral icterus.  Neck: Normal range of motion. Neck supple.  Cardiovascular: Normal rate, regular rhythm and normal heart sounds.   Pulmonary/Chest: Effort normal and breath sounds normal. No respiratory distress.  Abdominal: Soft. Bowel sounds are normal. She exhibits no distension.  There is no tenderness.  Musculoskeletal: She exhibits no edema.  Lymphadenopathy:    She has no cervical adenopathy.  Neurological: She is alert and oriented to person, place, and time.  NO FOCAL DEFICITS   Psychiatric: She has a normal mood and affect.  Vitals reviewed.         Assessment & Plan:

## 2015-05-30 NOTE — Progress Notes (Signed)
REVIEWED-NO ADDITIONAL RECOMMENDATIONS. 

## 2015-05-30 NOTE — Assessment & Plan Note (Signed)
SYMPTOMS NOT CONTROLLED. CANNOT AFFORD LINZESS OR AMITIZA.  DRINK WATER TO KEEP YOUR URINE LIGHT YELLOW. BULK UP STOOL WITH FIBER. TAKE MIRALAX IN THE MORNING. TAKE HERBAL STOOL SOFTENER 1 AT BEDTIME FOR 7 DAYS AND IF NO SATISFACTORY BOWEL MOVEMENT AFTER 7 DAYS THEN GO UP TO 2 AT BEDTIME. CALL IN 3 WEEKS DAYS IF SYMPTOMS ARE NOT IMPROVED.  FOLLOW UP IN 4 MOS.

## 2015-05-30 NOTE — Assessment & Plan Note (Signed)
PT HAD INCOMPLETE TCS OCT 2015 DUE TO POOR PREP.  WANTS TO WAIT UNTIL AFTER JAN 1 WHEN SHE CHANGES INSURANCE TO GET A TCS.

## 2015-05-30 NOTE — Patient Instructions (Signed)
DRINK WATER TO KEEP YOUR URINE LIGHT YELLOW.  BULK UP YOUR STOOL WITH FIBER.  TAKE MIRALAX IN THE MORNING.  TAKE HERBAL STOOL SOFTENER 1 AT BEDTIME FOR 7 DAYS AND IF NO SATISFACTORY BOWEL MOVEMENT AFTER 7 DAYS THEN GO UP TO 2 AT BEDTIME.   PLEASE CALL IN 3 WEEKS DAYS IF YOUR SYMPTOMS ARE NOT IMPROVED.   FOLLOW UP IN 4 MOS.   High-Fiber Diet A high-fiber diet changes your normal diet to include more whole grains, legumes, fruits, and vegetables. Changes in the diet involve replacing refined carbohydrates with unrefined foods. The calorie level of the diet is essentially unchanged. The Dietary Reference Intake (recommended amount) for adult males is 38 grams per day. For adult females, it is 25 grams per day. Pregnant and lactating women should consume 28 grams of fiber per day. Fiber is the intact part of a plant that is not broken down during digestion. Functional fiber is fiber that has been isolated from the plant to provide a beneficial effect in the body. PURPOSE  Increase stool bulk.   Ease and regulate bowel movements.   Lower cholesterol.  REDUCE RISK OF COLON CANCER  INDICATIONS THAT YOU NEED MORE FIBER  Constipation and hemorrhoids.   Uncomplicated diverticulosis (intestine condition) and irritable bowel syndrome.   Weight management.   As a protective measure against hardening of the arteries (atherosclerosis), diabetes, and cancer.   GUIDELINES FOR INCREASING FIBER IN THE DIET  Start adding fiber to the diet slowly. A gradual increase of about 5 more grams (2 slices of whole-wheat bread, 2 servings of most fruits or vegetables, or 1 bowl of high-fiber cereal) per day is best. Too rapid an increase in fiber may result in constipation, flatulence, and bloating.   Drink enough water and fluids to keep your urine clear or pale yellow. Water, juice, or caffeine-free drinks are recommended. Not drinking enough fluid may cause constipation.   Eat a variety of high-fiber  foods rather than one type of fiber.   Try to increase your intake of fiber through using high-fiber foods rather than fiber pills or supplements that contain small amounts of fiber.   The goal is to change the types of food eaten. Do not supplement your present diet with high-fiber foods, but replace foods in your present diet.   INCLUDE A VARIETY OF FIBER SOURCES  Replace refined and processed grains with whole grains, canned fruits with fresh fruits, and incorporate other fiber sources. White rice, white breads, and most bakery goods contain little or no fiber.   Brown whole-grain rice, buckwheat oats, and many fruits and vegetables are all good sources of fiber. These include: broccoli, Brussels sprouts, cabbage, cauliflower, beets, sweet potatoes, white potatoes (skin on), carrots, tomatoes, eggplant, squash, berries, fresh fruits, and dried fruits.   Cereals appear to be the richest source of fiber. Cereal fiber is found in whole grains and bran. Bran is the fiber-rich outer coat of cereal grain, which is largely removed in refining. In whole-grain cereals, the bran remains. In breakfast cereals, the largest amount of fiber is found in those with "bran" in their names. The fiber content is sometimes indicated on the label.   You may need to include additional fruits and vegetables each day.   In baking, for 1 cup white flour, you may use the following substitutions:   1 cup whole-wheat flour minus 2 tablespoons.   1/2 cup white flour plus 1/2 cup whole-wheat flour.

## 2015-05-30 NOTE — Progress Notes (Signed)
ON RECALL  °

## 2015-05-30 NOTE — Progress Notes (Signed)
cc'ed to pcp °

## 2015-09-04 ENCOUNTER — Encounter: Payer: Self-pay | Admitting: Gastroenterology

## 2015-10-08 DIAGNOSIS — I10 Essential (primary) hypertension: Secondary | ICD-10-CM | POA: Diagnosis not present

## 2015-10-08 DIAGNOSIS — F17200 Nicotine dependence, unspecified, uncomplicated: Secondary | ICD-10-CM | POA: Diagnosis not present

## 2015-10-08 DIAGNOSIS — J410 Simple chronic bronchitis: Secondary | ICD-10-CM | POA: Diagnosis not present

## 2015-10-08 DIAGNOSIS — I2510 Atherosclerotic heart disease of native coronary artery without angina pectoris: Secondary | ICD-10-CM | POA: Diagnosis not present

## 2016-01-07 DIAGNOSIS — F17200 Nicotine dependence, unspecified, uncomplicated: Secondary | ICD-10-CM | POA: Diagnosis not present

## 2016-01-07 DIAGNOSIS — E784 Other hyperlipidemia: Secondary | ICD-10-CM | POA: Diagnosis not present

## 2016-01-07 DIAGNOSIS — Z720 Tobacco use: Secondary | ICD-10-CM | POA: Diagnosis not present

## 2016-01-07 DIAGNOSIS — I2510 Atherosclerotic heart disease of native coronary artery without angina pectoris: Secondary | ICD-10-CM | POA: Diagnosis not present

## 2016-01-07 DIAGNOSIS — I10 Essential (primary) hypertension: Secondary | ICD-10-CM | POA: Diagnosis not present

## 2016-04-22 DIAGNOSIS — R109 Unspecified abdominal pain: Secondary | ICD-10-CM | POA: Diagnosis not present

## 2016-04-22 DIAGNOSIS — K589 Irritable bowel syndrome without diarrhea: Secondary | ICD-10-CM | POA: Diagnosis not present

## 2016-04-22 DIAGNOSIS — Z0000 Encounter for general adult medical examination without abnormal findings: Secondary | ICD-10-CM | POA: Diagnosis not present

## 2016-04-22 DIAGNOSIS — I10 Essential (primary) hypertension: Secondary | ICD-10-CM | POA: Diagnosis not present

## 2016-04-22 DIAGNOSIS — J410 Simple chronic bronchitis: Secondary | ICD-10-CM | POA: Diagnosis not present

## 2016-04-22 DIAGNOSIS — I509 Heart failure, unspecified: Secondary | ICD-10-CM | POA: Diagnosis not present

## 2016-04-22 DIAGNOSIS — F17200 Nicotine dependence, unspecified, uncomplicated: Secondary | ICD-10-CM | POA: Diagnosis not present

## 2016-06-12 DIAGNOSIS — Z23 Encounter for immunization: Secondary | ICD-10-CM | POA: Diagnosis not present

## 2016-08-24 DIAGNOSIS — K589 Irritable bowel syndrome without diarrhea: Secondary | ICD-10-CM | POA: Diagnosis not present

## 2016-08-24 DIAGNOSIS — I10 Essential (primary) hypertension: Secondary | ICD-10-CM | POA: Diagnosis not present

## 2016-08-24 DIAGNOSIS — F17200 Nicotine dependence, unspecified, uncomplicated: Secondary | ICD-10-CM | POA: Diagnosis not present

## 2016-08-24 DIAGNOSIS — J410 Simple chronic bronchitis: Secondary | ICD-10-CM | POA: Diagnosis not present

## 2017-01-25 ENCOUNTER — Other Ambulatory Visit (HOSPITAL_COMMUNITY): Payer: Self-pay | Admitting: Internal Medicine

## 2017-01-25 DIAGNOSIS — K589 Irritable bowel syndrome without diarrhea: Secondary | ICD-10-CM | POA: Diagnosis not present

## 2017-01-25 DIAGNOSIS — M1990 Unspecified osteoarthritis, unspecified site: Secondary | ICD-10-CM | POA: Diagnosis not present

## 2017-01-25 DIAGNOSIS — Z780 Asymptomatic menopausal state: Secondary | ICD-10-CM

## 2017-01-25 DIAGNOSIS — I2510 Atherosclerotic heart disease of native coronary artery without angina pectoris: Secondary | ICD-10-CM | POA: Diagnosis not present

## 2017-01-25 DIAGNOSIS — J410 Simple chronic bronchitis: Secondary | ICD-10-CM | POA: Diagnosis not present

## 2017-01-29 ENCOUNTER — Other Ambulatory Visit (HOSPITAL_COMMUNITY): Payer: PPO

## 2017-02-01 ENCOUNTER — Ambulatory Visit (HOSPITAL_COMMUNITY)
Admission: RE | Admit: 2017-02-01 | Discharge: 2017-02-01 | Disposition: A | Discharge status: Home / Self Care | Payer: PPO | Source: Ambulatory Visit | Attending: Internal Medicine | Admitting: Internal Medicine

## 2017-02-01 DIAGNOSIS — M818 Other osteoporosis without current pathological fracture: Secondary | ICD-10-CM | POA: Diagnosis not present

## 2017-02-01 DIAGNOSIS — M810 Age-related osteoporosis without current pathological fracture: Secondary | ICD-10-CM | POA: Diagnosis not present

## 2017-02-01 DIAGNOSIS — Z780 Asymptomatic menopausal state: Secondary | ICD-10-CM | POA: Diagnosis not present

## 2017-04-28 DIAGNOSIS — Z1389 Encounter for screening for other disorder: Secondary | ICD-10-CM | POA: Diagnosis not present

## 2017-04-28 DIAGNOSIS — I10 Essential (primary) hypertension: Secondary | ICD-10-CM | POA: Diagnosis not present

## 2017-04-28 DIAGNOSIS — Z0000 Encounter for general adult medical examination without abnormal findings: Secondary | ICD-10-CM | POA: Diagnosis not present

## 2017-04-28 DIAGNOSIS — F17210 Nicotine dependence, cigarettes, uncomplicated: Secondary | ICD-10-CM | POA: Diagnosis not present

## 2017-04-28 DIAGNOSIS — F17200 Nicotine dependence, unspecified, uncomplicated: Secondary | ICD-10-CM | POA: Diagnosis not present

## 2017-04-28 DIAGNOSIS — R109 Unspecified abdominal pain: Secondary | ICD-10-CM | POA: Diagnosis not present

## 2017-04-28 DIAGNOSIS — J410 Simple chronic bronchitis: Secondary | ICD-10-CM | POA: Diagnosis not present

## 2017-04-28 DIAGNOSIS — I2510 Atherosclerotic heart disease of native coronary artery without angina pectoris: Secondary | ICD-10-CM | POA: Diagnosis not present

## 2017-04-28 DIAGNOSIS — I509 Heart failure, unspecified: Secondary | ICD-10-CM | POA: Diagnosis not present

## 2017-06-25 DIAGNOSIS — Z23 Encounter for immunization: Secondary | ICD-10-CM | POA: Diagnosis not present

## 2017-10-07 DIAGNOSIS — K589 Irritable bowel syndrome without diarrhea: Secondary | ICD-10-CM | POA: Diagnosis not present

## 2017-10-07 DIAGNOSIS — I2510 Atherosclerotic heart disease of native coronary artery without angina pectoris: Secondary | ICD-10-CM | POA: Diagnosis not present

## 2017-10-07 DIAGNOSIS — J40 Bronchitis, not specified as acute or chronic: Secondary | ICD-10-CM | POA: Diagnosis not present

## 2017-10-07 DIAGNOSIS — I10 Essential (primary) hypertension: Secondary | ICD-10-CM | POA: Diagnosis not present

## 2017-10-19 DIAGNOSIS — H4720 Unspecified optic atrophy: Secondary | ICD-10-CM | POA: Diagnosis not present

## 2017-10-19 DIAGNOSIS — H2513 Age-related nuclear cataract, bilateral: Secondary | ICD-10-CM | POA: Diagnosis not present

## 2017-10-19 DIAGNOSIS — H04123 Dry eye syndrome of bilateral lacrimal glands: Secondary | ICD-10-CM | POA: Diagnosis not present

## 2017-10-19 DIAGNOSIS — H5202 Hypermetropia, left eye: Secondary | ICD-10-CM | POA: Diagnosis not present

## 2017-10-19 DIAGNOSIS — H524 Presbyopia: Secondary | ICD-10-CM | POA: Diagnosis not present

## 2017-10-19 DIAGNOSIS — H52202 Unspecified astigmatism, left eye: Secondary | ICD-10-CM | POA: Diagnosis not present

## 2017-10-25 DIAGNOSIS — L308 Other specified dermatitis: Secondary | ICD-10-CM | POA: Diagnosis not present

## 2017-10-25 DIAGNOSIS — L918 Other hypertrophic disorders of the skin: Secondary | ICD-10-CM | POA: Diagnosis not present

## 2017-11-06 ENCOUNTER — Other Ambulatory Visit: Payer: Self-pay

## 2018-01-08 ENCOUNTER — Emergency Department (HOSPITAL_COMMUNITY)
Admission: EM | Admit: 2018-01-08 | Discharge: 2018-01-08 | Disposition: A | Discharge status: Home / Self Care | Payer: PPO | Attending: Emergency Medicine | Admitting: Emergency Medicine

## 2018-01-08 ENCOUNTER — Encounter (HOSPITAL_COMMUNITY): Payer: Self-pay | Admitting: Emergency Medicine

## 2018-01-08 ENCOUNTER — Other Ambulatory Visit: Payer: Self-pay

## 2018-01-08 DIAGNOSIS — I252 Old myocardial infarction: Secondary | ICD-10-CM | POA: Insufficient documentation

## 2018-01-08 DIAGNOSIS — Z7982 Long term (current) use of aspirin: Secondary | ICD-10-CM | POA: Diagnosis not present

## 2018-01-08 DIAGNOSIS — Z79899 Other long term (current) drug therapy: Secondary | ICD-10-CM | POA: Insufficient documentation

## 2018-01-08 DIAGNOSIS — I10 Essential (primary) hypertension: Secondary | ICD-10-CM | POA: Diagnosis not present

## 2018-01-08 DIAGNOSIS — Z8673 Personal history of transient ischemic attack (TIA), and cerebral infarction without residual deficits: Secondary | ICD-10-CM | POA: Diagnosis not present

## 2018-01-08 DIAGNOSIS — I2510 Atherosclerotic heart disease of native coronary artery without angina pectoris: Secondary | ICD-10-CM | POA: Insufficient documentation

## 2018-01-08 DIAGNOSIS — E785 Hyperlipidemia, unspecified: Secondary | ICD-10-CM | POA: Insufficient documentation

## 2018-01-08 DIAGNOSIS — F17210 Nicotine dependence, cigarettes, uncomplicated: Secondary | ICD-10-CM | POA: Diagnosis not present

## 2018-01-08 DIAGNOSIS — H9312 Tinnitus, left ear: Secondary | ICD-10-CM

## 2018-01-08 DIAGNOSIS — H9202 Otalgia, left ear: Secondary | ICD-10-CM | POA: Diagnosis not present

## 2018-01-08 MED ORDER — DIPHENHYDRAMINE HCL 25 MG PO TABS: 25.0000 mg | ORAL_TABLET | Freq: Three times a day (TID) | ORAL | 0 refills | Status: DC | PRN

## 2018-01-08 NOTE — Discharge Instructions (Signed)
The Benadryl may help with the ringing and may help the fluid behind the ear.  Follow-up with Dr. Suszanne Conners, from ENT as needed.

## 2018-01-08 NOTE — ED Provider Notes (Signed)
[facility] EMERGENCY DEPARTMENT Provider Note   CSN: [id number] Arrival date & time: 01/08/18  4098     History   Chief Complaint Chief Complaint  Patient presents with  . Tinnitus    HPI [name] is a 67 y.o. female.  HPI Patient presents with ringing in her left ear.  Began last night.  States it is somewhat loud but is still able to hearing with it.  Some difficulty sleeping with it.  Some mild pain in the ear.  No trauma.  States she is under a lot of stress because she just lost her job.  No headaches.  No vision changes.  No sore throat. Past Medical History:  Diagnosis Date  . Anxiety   . Blind    right  . Colitis 09/07/2011  . CVA (cerebral infarction)   . GERD (gastroesophageal reflux disease)   . Hyperlipidemia   . Hypertension   . Myocardial infarct (HCC)    1997, 2004  . Severe major depression with psychotic features (HCC)    2004  . Stroke [facility]) 2002    Patient Active Problem List   Diagnosis Date Noted  . Constipation - functional 10/27/2012  . Colon cancer screening 10/27/2012  . History of stroke 10/26/2012  . Chronic back pain 10/26/2012  . CAD (coronary artery disease), native coronary artery 09/07/2011  . HYPERLIPIDEMIA-MIXED 03/18/2009  . HYPERTENSION, UNSPECIFIED 03/18/2009  . RENAL DISEASE 03/18/2009    Past Surgical History:  Procedure Laterality Date  . ABDOMINAL HYSTERECTOMY    . BILATERAL SALPINGOOPHORECTOMY    . COLONOSCOPY N/A 07/17/2014   RMR: Extremely redundant colon. Colonic diverticulosis. Inadequate prepartation precluded examination of all the rectal and colonic neoplasm. I suspect slow colonic transit in the setting of a markely redundant colon.  . CORONARY ANGIOPLASTY WITH STENT PLACEMENT  1191,4782  . cyst removed from left wrist    . ESOPHAGOGASTRODUODENOSCOPY  09/05/2004   Dr Jimmye Norman gastritis, candida esophagitis  . ESOPHAGOGASTRODUODENOSCOPY  09/07/2011   [id number], esophageal in the distal  esophagus/Mild gastritis  . Lumps removed from each breast       OB History   None      Home Medications    Prior to Admission medications   Medication Sig Start Date End Date Taking? Authorizing Provider  amLODipine (NORVASC) 10 MG tablet Take 10 mg by mouth daily.    [provider]  aspirin EC 81 MG tablet Take 81 mg by mouth daily.      [provider]  cloNIDine (CATAPRES) 0.3 MG tablet Take 0.3 mg by mouth 2 (two) times daily.    [provider]  dextromethorphan-guaiFENesin (MUCINEX DM) 30-600 MG per 12 hr tablet Take 1 tablet by mouth 2 (two) times daily.    [provider]  diphenhydrAMINE (BENADRYL) 25 MG tablet Take 1 tablet (25 mg total) by mouth every 8 (eight) hours as needed (Tinnitus). 01/08/18   Benjiman Core, MD  docusate sodium (COLACE) 100 MG capsule Take 100 mg by mouth daily.      [provider]  furosemide (LASIX) 20 MG tablet Take 20 mg by mouth daily.    [provider]  Linaclotide Karlene Einstein) 145 MCG CAPS capsule Take 1 capsule (145 mcg total) by mouth daily. 07/18/14   Avon Gully, MD  lubiprostone (AMITIZA) 24 MCG capsule Take 1 capsule (24 mcg total) by mouth 2 (two) times daily with a meal. 05/30/15   Fields, [name] Cleaver, MD  lubiprostone (AMITIZA) 8 MCG capsule  Take 1 capsule (8 mcg total) by mouth 2 (two) times daily with a meal. Patient not taking: Reported on 08/21/2014 08/02/14   Gelene Mink, NP  metoprolol (LOPRESSOR) 50 MG tablet Take 1 tablet (50 mg total) by mouth 2 (two) times daily. 10/26/12   Salley Scarlet, MD  omeprazole (PRILOSEC) 20 MG capsule TAKE 1 CAPSULE BY MOUTH EVERY DAY 10/17/14   Tiffany Kocher, PA-C  polyethylene glycol powder (GLYCOLAX/MIRALAX) powder Take 17 g by mouth daily.    [provider]  simvastatin (ZOCOR) 40 MG tablet Take 1 tablet (40 mg total) by mouth daily. 10/26/12   Salley Scarlet, MD  traMADol (ULTRAM) 50 MG tablet Take 50 mg by mouth every 6 (six)  hours as needed for moderate pain. 10/03/12   Bethann Berkshire, MD    Family History Family History  Problem Relation Age of Onset  . Multiple sclerosis Father   . Alzheimer's disease Mother   . Hypertension Mother   . Diabetes Mother   . Colon cancer Neg Hx     Social History Social History   Tobacco Use  . Smoking status: Current Some Day Smoker    Packs/day: 0.25    Years: 40.00    Pack years: 10.00    Types: Cigarettes  . Smokeless tobacco: Never Used  Substance Use Topics  . Alcohol use: No    Alcohol/week: 0.0 oz  . Drug use: No     Allergies   Penicillins; Aspirin; Bee venom; Contrast media [iodinated diagnostic agents]; Dilaudid [hydromorphone hcl]; Lisinopril; and Motrin [ibuprofen]   Review of Systems Review of Systems  Constitutional: Negative for appetite change.  HENT: Positive for tinnitus. Negative for congestion.   Eyes: Negative for pain.  Respiratory: Negative for shortness of breath.   Cardiovascular: Negative for chest pain.  Neurological: Negative for headaches.     Physical Exam Updated Vital Signs BP (!) 153/62 (BP Location: Right Arm)   Pulse 72   Temp 98 F (36.7 C) (Oral)   Resp 16   Wt 51.3 kg (113 lb)   SpO2 91%   BMI 22.07 kg/m   Physical Exam  Constitutional: She appears well-developed.  HENT:  Right TM normal.  Left TM with mild effusion without erythema.  Eyes: Pupils are equal, round, and reactive to light.  Neck: Neck supple.  Cardiovascular: Normal rate.  Pulmonary/Chest: Effort normal.  Abdominal: Soft.     ED Treatments / Results  Labs (all labs ordered are listed, but only abnormal results are displayed) Labs Reviewed - No data to display  EKG None  Radiology No results found.  Procedures Procedures (including critical care time)  Medications Ordered in ED Medications - No data to display   Initial Impression / Assessment and Plan / ED Course  I have reviewed the triage vital signs and the  nursing notes.  Pertinent labs & imaging results that were available during my care of the patient were reviewed by me and considered in my medical decision making (see chart for details).     Patient with tinnitus in left ear.  Still able to hear grossly out of that ear.  Small effusion.  Given Benadryl to potentially help with congestion.  May be an anxiety component/depression component with the recent job loss.  Somewhat tearful on exam.  Discharge home with ENT follow-up as needed.  Final Clinical Impressions(s) / ED Diagnoses   Final diagnoses:  Tinnitus of left ear    ED Discharge Orders  Ordered    diphenhydrAMINE (BENADRYL) 25 MG tablet  Every 8 hours PRN     01/08/18 0744       Benjiman Core, MD 01/08/18 [phone]

## 2018-01-08 NOTE — ED Triage Notes (Signed)
Patient complains of left ear ringing which started last night. States no alleviating or exacerbating of the symptom. Has not tried anything to treat at home. Denies fever, chills, or cleaning her ear at home.

## 2018-01-19 DIAGNOSIS — I2510 Atherosclerotic heart disease of native coronary artery without angina pectoris: Secondary | ICD-10-CM | POA: Diagnosis not present

## 2018-01-19 DIAGNOSIS — Z0001 Encounter for general adult medical examination with abnormal findings: Secondary | ICD-10-CM | POA: Diagnosis not present

## 2018-01-19 DIAGNOSIS — Z1331 Encounter for screening for depression: Secondary | ICD-10-CM | POA: Diagnosis not present

## 2018-01-19 DIAGNOSIS — J410 Simple chronic bronchitis: Secondary | ICD-10-CM | POA: Diagnosis not present

## 2018-01-19 DIAGNOSIS — Z1389 Encounter for screening for other disorder: Secondary | ICD-10-CM | POA: Diagnosis not present

## 2018-01-19 DIAGNOSIS — R109 Unspecified abdominal pain: Secondary | ICD-10-CM | POA: Diagnosis not present

## 2018-01-19 DIAGNOSIS — F17210 Nicotine dependence, cigarettes, uncomplicated: Secondary | ICD-10-CM | POA: Diagnosis not present

## 2018-01-19 DIAGNOSIS — Z0000 Encounter for general adult medical examination without abnormal findings: Secondary | ICD-10-CM | POA: Diagnosis not present

## 2018-01-19 DIAGNOSIS — I10 Essential (primary) hypertension: Secondary | ICD-10-CM | POA: Diagnosis not present

## 2018-01-19 DIAGNOSIS — I509 Heart failure, unspecified: Secondary | ICD-10-CM | POA: Diagnosis not present

## 2018-03-31 ENCOUNTER — Ambulatory Visit (INDEPENDENT_AMBULATORY_CARE_PROVIDER_SITE_OTHER): Payer: PPO | Admitting: Otolaryngology

## 2018-04-04 DIAGNOSIS — I10 Essential (primary) hypertension: Secondary | ICD-10-CM | POA: Diagnosis not present

## 2018-04-04 DIAGNOSIS — F17210 Nicotine dependence, cigarettes, uncomplicated: Secondary | ICD-10-CM | POA: Diagnosis not present

## 2018-04-04 DIAGNOSIS — R42 Dizziness and giddiness: Secondary | ICD-10-CM | POA: Diagnosis not present

## 2018-04-04 DIAGNOSIS — I2510 Atherosclerotic heart disease of native coronary artery without angina pectoris: Secondary | ICD-10-CM | POA: Diagnosis not present

## 2018-04-04 DIAGNOSIS — J410 Simple chronic bronchitis: Secondary | ICD-10-CM | POA: Diagnosis not present

## 2018-04-06 ENCOUNTER — Ambulatory Visit (HOSPITAL_COMMUNITY)
Admission: RE | Admit: 2018-04-06 | Discharge: 2018-04-06 | Disposition: A | Discharge status: Home / Self Care | Payer: PPO | Source: Ambulatory Visit | Attending: Internal Medicine | Admitting: Internal Medicine

## 2018-04-08 ENCOUNTER — Other Ambulatory Visit (HOSPITAL_COMMUNITY): Payer: Self-pay | Admitting: Internal Medicine

## 2018-04-08 DIAGNOSIS — R55 Syncope and collapse: Secondary | ICD-10-CM

## 2018-04-13 ENCOUNTER — Other Ambulatory Visit (HOSPITAL_COMMUNITY): Payer: Self-pay | Admitting: Internal Medicine

## 2018-04-13 DIAGNOSIS — R55 Syncope and collapse: Secondary | ICD-10-CM

## 2018-04-14 ENCOUNTER — Ambulatory Visit (HOSPITAL_COMMUNITY)
Admission: RE | Admit: 2018-04-14 | Discharge: 2018-04-14 | Disposition: A | Discharge status: Home / Self Care | Payer: PPO | Source: Ambulatory Visit | Attending: Internal Medicine | Admitting: Internal Medicine

## 2018-04-14 DIAGNOSIS — I6523 Occlusion and stenosis of bilateral carotid arteries: Secondary | ICD-10-CM | POA: Insufficient documentation

## 2018-04-14 DIAGNOSIS — R55 Syncope and collapse: Secondary | ICD-10-CM | POA: Diagnosis not present

## 2018-04-14 DIAGNOSIS — I10 Essential (primary) hypertension: Secondary | ICD-10-CM | POA: Diagnosis not present

## 2018-04-14 DIAGNOSIS — E785 Hyperlipidemia, unspecified: Secondary | ICD-10-CM | POA: Insufficient documentation

## 2018-04-14 DIAGNOSIS — I080 Rheumatic disorders of both mitral and aortic valves: Secondary | ICD-10-CM | POA: Diagnosis not present

## 2018-04-14 NOTE — Progress Notes (Signed)
*  PRELIMINARY RESULTS* Echocardiogram 2D Echocardiogram has been performed.  Stacey Drain 04/14/2018, 1:47 PM

## 2018-05-05 ENCOUNTER — Ambulatory Visit (INDEPENDENT_AMBULATORY_CARE_PROVIDER_SITE_OTHER): Payer: PPO | Admitting: Otolaryngology

## 2018-05-05 DIAGNOSIS — H903 Sensorineural hearing loss, bilateral: Secondary | ICD-10-CM

## 2018-05-05 DIAGNOSIS — H9313 Tinnitus, bilateral: Secondary | ICD-10-CM | POA: Diagnosis not present

## 2018-05-26 ENCOUNTER — Other Ambulatory Visit (HOSPITAL_COMMUNITY): Payer: Self-pay | Admitting: Internal Medicine

## 2018-05-26 DIAGNOSIS — I2510 Atherosclerotic heart disease of native coronary artery without angina pectoris: Secondary | ICD-10-CM | POA: Diagnosis not present

## 2018-05-26 DIAGNOSIS — R51 Headache: Secondary | ICD-10-CM | POA: Diagnosis not present

## 2018-05-26 DIAGNOSIS — I10 Essential (primary) hypertension: Secondary | ICD-10-CM | POA: Diagnosis not present

## 2018-05-26 DIAGNOSIS — R519 Headache, unspecified: Secondary | ICD-10-CM

## 2018-05-30 ENCOUNTER — Ambulatory Visit (HOSPITAL_COMMUNITY): Payer: PPO

## 2018-06-29 DIAGNOSIS — Z23 Encounter for immunization: Secondary | ICD-10-CM | POA: Diagnosis not present

## 2019-04-19 DIAGNOSIS — J410 Simple chronic bronchitis: Secondary | ICD-10-CM | POA: Diagnosis not present

## 2019-04-19 DIAGNOSIS — Z0001 Encounter for general adult medical examination with abnormal findings: Secondary | ICD-10-CM | POA: Diagnosis not present

## 2019-04-19 DIAGNOSIS — Z1389 Encounter for screening for other disorder: Secondary | ICD-10-CM | POA: Diagnosis not present

## 2019-04-19 DIAGNOSIS — Z1331 Encounter for screening for depression: Secondary | ICD-10-CM | POA: Diagnosis not present

## 2019-04-19 DIAGNOSIS — I2510 Atherosclerotic heart disease of native coronary artery without angina pectoris: Secondary | ICD-10-CM | POA: Diagnosis not present

## 2019-04-19 DIAGNOSIS — E7849 Other hyperlipidemia: Secondary | ICD-10-CM | POA: Diagnosis not present

## 2019-04-19 DIAGNOSIS — I10 Essential (primary) hypertension: Secondary | ICD-10-CM | POA: Diagnosis not present

## 2019-04-21 ENCOUNTER — Other Ambulatory Visit: Payer: Self-pay

## 2019-05-20 DIAGNOSIS — I10 Essential (primary) hypertension: Secondary | ICD-10-CM | POA: Diagnosis not present

## 2019-05-20 DIAGNOSIS — I2510 Atherosclerotic heart disease of native coronary artery without angina pectoris: Secondary | ICD-10-CM | POA: Diagnosis not present

## 2019-05-24 ENCOUNTER — Telehealth: Payer: Self-pay | Admitting: Gastroenterology

## 2019-05-25 NOTE — Telephone Encounter (Signed)
Opened in error

## 2019-06-20 DIAGNOSIS — I10 Essential (primary) hypertension: Secondary | ICD-10-CM | POA: Diagnosis not present

## 2019-06-20 DIAGNOSIS — I2510 Atherosclerotic heart disease of native coronary artery without angina pectoris: Secondary | ICD-10-CM | POA: Diagnosis not present

## 2019-07-20 DIAGNOSIS — I2510 Atherosclerotic heart disease of native coronary artery without angina pectoris: Secondary | ICD-10-CM | POA: Diagnosis not present

## 2019-07-20 DIAGNOSIS — I10 Essential (primary) hypertension: Secondary | ICD-10-CM | POA: Diagnosis not present

## 2019-08-03 ENCOUNTER — Emergency Department (HOSPITAL_COMMUNITY): Payer: PPO

## 2019-08-03 ENCOUNTER — Other Ambulatory Visit: Payer: Self-pay

## 2019-08-03 ENCOUNTER — Emergency Department (HOSPITAL_COMMUNITY)
Admission: EM | Admit: 2019-08-03 | Discharge: 2019-08-04 | Disposition: A | Discharge status: Home / Self Care | Payer: PPO | Attending: Emergency Medicine | Admitting: Emergency Medicine

## 2019-08-03 ENCOUNTER — Encounter (HOSPITAL_COMMUNITY): Payer: Self-pay

## 2019-08-03 DIAGNOSIS — I10 Essential (primary) hypertension: Secondary | ICD-10-CM | POA: Insufficient documentation

## 2019-08-03 DIAGNOSIS — I2510 Atherosclerotic heart disease of native coronary artery without angina pectoris: Secondary | ICD-10-CM | POA: Diagnosis not present

## 2019-08-03 DIAGNOSIS — Z8673 Personal history of transient ischemic attack (TIA), and cerebral infarction without residual deficits: Secondary | ICD-10-CM | POA: Diagnosis not present

## 2019-08-03 DIAGNOSIS — T148XXA Other injury of unspecified body region, initial encounter: Secondary | ICD-10-CM

## 2019-08-03 DIAGNOSIS — M25571 Pain in right ankle and joints of right foot: Secondary | ICD-10-CM | POA: Diagnosis not present

## 2019-08-03 DIAGNOSIS — Z87891 Personal history of nicotine dependence: Secondary | ICD-10-CM | POA: Diagnosis not present

## 2019-08-03 DIAGNOSIS — Z7982 Long term (current) use of aspirin: Secondary | ICD-10-CM | POA: Insufficient documentation

## 2019-08-03 DIAGNOSIS — I252 Old myocardial infarction: Secondary | ICD-10-CM | POA: Insufficient documentation

## 2019-08-03 DIAGNOSIS — S92151A Displaced avulsion fracture (chip fracture) of right talus, initial encounter for closed fracture: Secondary | ICD-10-CM | POA: Diagnosis not present

## 2019-08-03 DIAGNOSIS — S99911A Unspecified injury of right ankle, initial encounter: Secondary | ICD-10-CM | POA: Diagnosis present

## 2019-08-03 DIAGNOSIS — W010XXA Fall on same level from slipping, tripping and stumbling without subsequent striking against object, initial encounter: Secondary | ICD-10-CM | POA: Insufficient documentation

## 2019-08-03 DIAGNOSIS — Y939 Activity, unspecified: Secondary | ICD-10-CM | POA: Diagnosis not present

## 2019-08-03 DIAGNOSIS — Z79899 Other long term (current) drug therapy: Secondary | ICD-10-CM | POA: Insufficient documentation

## 2019-08-03 DIAGNOSIS — M79671 Pain in right foot: Secondary | ICD-10-CM | POA: Diagnosis not present

## 2019-08-03 DIAGNOSIS — Y999 Unspecified external cause status: Secondary | ICD-10-CM | POA: Insufficient documentation

## 2019-08-03 DIAGNOSIS — Y929 Unspecified place or not applicable: Secondary | ICD-10-CM | POA: Insufficient documentation

## 2019-08-03 DIAGNOSIS — S99921A Unspecified injury of right foot, initial encounter: Secondary | ICD-10-CM | POA: Diagnosis not present

## 2019-08-03 MED ORDER — HYDROCODONE-ACETAMINOPHEN 5-325 MG PO TABS
1.0000 | ORAL_TABLET | Freq: Once | ORAL | Status: AC
  Administered 2019-08-03: 1 via ORAL
  Filled 2019-08-03: qty 1

## 2019-08-03 NOTE — ED Triage Notes (Signed)
Pt reports falling this evening, no LOC, did not hit head, reports right foot pain "all over". No deformity noted, no swelling, bruising, or redness. Skin intact.

## 2019-08-03 NOTE — ED Provider Notes (Signed)
[facility] EMERGENCY DEPARTMENT Provider Note   CSN: [id number] Arrival date & time: 08/03/19  2201     History   Chief Complaint Chief Complaint  Patient presents with  . Fall    right foot pain    HPI [name] is a 68 y.o. female.     Patient presents to the emergency department for evaluation of right foot injury.  Patient reports experiencing a fall earlier today injuring the right foot and ankle.  She did not hit her head or suffer injury elsewhere.  Patient reports diffuse pain all over her foot and cannot bear weight since the injury.     Past Medical History:  Diagnosis Date  . Anxiety   . Blind    right  . Colitis 09/07/2011  . CVA (cerebral infarction)   . GERD (gastroesophageal reflux disease)   . Hyperlipidemia   . Hypertension   . Myocardial infarct ([place])    1997, 2004  . Severe major depression with psychotic features ([place])    2004  . Stroke [facility]) 2002    Patient Active Problem List   Diagnosis Date Noted  . Constipation - functional 10/27/2012  . Colon cancer screening 10/27/2012  . History of stroke 10/26/2012  . Chronic back pain 10/26/2012  . CAD (coronary artery disease), native coronary artery 09/07/2011  . HYPERLIPIDEMIA-MIXED 03/18/2009  . HYPERTENSION, UNSPECIFIED 03/18/2009  . RENAL DISEASE 03/18/2009    Past Surgical History:  Procedure Laterality Date  . ABDOMINAL HYSTERECTOMY    . BILATERAL SALPINGOOPHORECTOMY    . COLONOSCOPY N/A 07/17/2014   RMR: Extremely redundant colon. Colonic diverticulosis. Inadequate prepartation precluded examination of all the rectal and colonic neoplasm. I suspect slow colonic transit in the setting of a markely redundant colon.  . CORONARY ANGIOPLASTY WITH STENT PLACEMENT  8938,1017  . cyst removed from left wrist    . ESOPHAGOGASTRODUODENOSCOPY  09/05/2004   Dr Chucky May gastritis, candida esophagitis  . ESOPHAGOGASTRODUODENOSCOPY  09/07/2011   [id number], esophageal in the distal  esophagus/Mild gastritis  . Lumps removed from each breast       OB History   No obstetric history on file.      Home Medications    Prior to Admission medications   Medication Sig Start Date End Date Taking? Authorizing Provider  amLODipine (NORVASC) 10 MG tablet Take 10 mg by mouth daily.    [provider]  aspirin EC 81 MG tablet Take 81 mg by mouth daily.      [provider]  cloNIDine (CATAPRES) 0.3 MG tablet Take 0.3 mg by mouth 2 (two) times daily.    [provider]  dextromethorphan-guaiFENesin (MUCINEX DM) 30-600 MG per 12 hr tablet Take 1 tablet by mouth 2 (two) times daily.    [provider]  diphenhydrAMINE (BENADRYL) 25 MG tablet Take 1 tablet (25 mg total) by mouth every 8 (eight) hours as needed (Tinnitus). 01/08/18   Davonna Belling, MD  docusate sodium (COLACE) 100 MG capsule Take 100 mg by mouth daily.      [provider]  furosemide (LASIX) 20 MG tablet Take 20 mg by mouth daily.    [provider]  Linaclotide Rolan Lipa) 145 MCG CAPS capsule Take 1 capsule (145 mcg total) by mouth daily. 07/18/14   Rosita Fire, MD  lubiprostone (AMITIZA) 24 MCG capsule Take 1 capsule (24 mcg total) by mouth 2 (two) times daily with a meal. 05/30/15   Fields, Marga Melnick, MD  lubiprostone (AMITIZA) 8 MCG  capsule Take 1 capsule (8 mcg total) by mouth 2 (two) times daily with a meal. Patient not taking: Reported on 08/21/2014 08/02/14   Gelene Mink, NP  metoprolol (LOPRESSOR) 50 MG tablet Take 1 tablet (50 mg total) by mouth 2 (two) times daily. 10/26/12   Salley Scarlet, MD  omeprazole (PRILOSEC) 20 MG capsule TAKE 1 CAPSULE BY MOUTH EVERY DAY 10/17/14   Tiffany Kocher, PA-C  polyethylene glycol powder (GLYCOLAX/MIRALAX) powder Take 17 g by mouth daily.    [provider]  simvastatin (ZOCOR) 40 MG tablet Take 1 tablet (40 mg total) by mouth daily. 10/26/12   Salley Scarlet, MD  traMADol (ULTRAM) 50 MG tablet Take 50 mg  by mouth every 6 (six) hours as needed for moderate pain. 10/03/12   Bethann Berkshire, MD    Family History Family History  Problem Relation Age of Onset  . Multiple sclerosis Father   . Alzheimer's disease Mother   . Hypertension Mother   . Diabetes Mother   . Colon cancer Neg Hx     Social History Social History   Tobacco Use  . Smoking status: Former Smoker    Packs/day: 0.25    Years: 40.00    Pack years: 10.00    Types: Cigarettes    Quit date: 08/02/2017    Years since quitting: 2.0  . Smokeless tobacco: Never Used  Substance Use Topics  . Alcohol use: No    Alcohol/week: 0.0 standard drinks  . Drug use: No     Allergies   Penicillins, Aspirin, Bee venom, Contrast media [iodinated diagnostic agents], Dilaudid [hydromorphone hcl], Lisinopril, and Motrin [ibuprofen]   Review of Systems Review of Systems  Respiratory: Negative for shortness of breath.   Cardiovascular: Negative for chest pain.  Musculoskeletal: Positive for arthralgias.  Neurological: Negative for syncope.     Physical Exam Updated Vital Signs BP 121/70   Pulse 74   Temp 99.1 F (37.3 C) (Oral)   Resp 17   Ht 5' (1.524 m)   Wt 58.1 kg   SpO2 92%   BMI 25.00 kg/m   Physical Exam Vitals signs and nursing note reviewed.  Constitutional:      General: She is not in acute distress.    Appearance: Normal appearance. She is well-developed.  HENT:     Head: Normocephalic and atraumatic.     Right Ear: Hearing normal.     Left Ear: Hearing normal.     Nose: Nose normal.  Eyes:     Conjunctiva/sclera: Conjunctivae normal.     Pupils: Pupils are equal, round, and reactive to light.  Neck:     Musculoskeletal: Normal range of motion and neck supple.  Cardiovascular:     Rate and Rhythm: Regular rhythm.     Heart sounds: S1 normal and S2 normal. No murmur. No friction rub. No gallop.   Pulmonary:     Effort: Pulmonary effort is normal. No respiratory distress.     Breath sounds: Normal  breath sounds.  Chest:     Chest wall: No tenderness.  Abdominal:     General: Bowel sounds are normal.     Palpations: Abdomen is soft.     Tenderness: There is no abdominal tenderness. There is no guarding or rebound. Negative signs include Murphy's sign and McBurney's sign.     Hernia: No hernia is present.  Musculoskeletal:     Right ankle: She exhibits decreased range of motion. She exhibits no swelling and  no deformity. Tenderness.     Right foot: Decreased range of motion. Tenderness present. No deformity.  Skin:    General: Skin is warm and dry.     Findings: No rash.  Neurological:     Mental Status: She is alert and oriented to person, place, and time.     GCS: GCS eye subscore is 4. GCS verbal subscore is 5. GCS motor subscore is 6.     Cranial Nerves: No cranial nerve deficit.     Sensory: No sensory deficit.     Coordination: Coordination normal.  Psychiatric:        Speech: Speech normal.        Behavior: Behavior normal.        Thought Content: Thought content normal.      ED Treatments / Results  Labs (all labs ordered are listed, but only abnormal results are displayed) Labs Reviewed - No data to display  EKG None  Radiology Dg Ankle Complete Right  Result Date: 08/04/2019 CLINICAL DATA:  Right ankle pain following fall, initial encounter EXAM: RIGHT ANKLE - COMPLETE 3+ VIEW COMPARISON:  None. FINDINGS: Better visualized on the ankle films is a small bony density adjacent to the dorsal aspect of the talus consistent with small avulsion. No other fracture is seen. No soft tissue abnormality is noted. IMPRESSION: Mild avulsion from the dorsal aspect of the talus. Electronically Signed   By: Alcide Clever M.D.   On: 08/04/2019 00:23   Dg Foot Complete Right  Result Date: 08/04/2019 CLINICAL DATA:  Recent fall with foot pain, initial encounter EXAM: RIGHT FOOT COMPLETE - 3+ VIEW COMPARISON:  None. FINDINGS: There is no evidence of fracture or dislocation.  There is no evidence of arthropathy or other focal bone abnormality. Soft tissues are unremarkable. IMPRESSION: No acute abnormality noted. Electronically Signed   By: Alcide Clever M.D.   On: 08/04/2019 00:21    Procedures Procedures (including critical care time)  Medications Ordered in ED Medications  HYDROcodone-acetaminophen (NORCO/VICODIN) 5-325 MG per tablet 1 tablet (1 tablet Oral Given 08/03/19 2332)     Initial Impression / Assessment and Plan / ED Course  I have reviewed the triage vital signs and the nursing notes.  Pertinent labs & imaging results that were available during my care of the patient were reviewed by me and considered in my medical decision making (see chart for details).        Patient presents to the emergency department for evaluation of isolated right lower extremity injury.  Patient reports a fall today injuring her right foot and ankle area.  No other injury.  Examination was otherwise unremarkable, other than diffuse tenderness from the ankle down.  X-ray of ankle was negative, x-ray of foot reveals small avulsion from talus.  Will place in cam walker, follow-up with orthopedics.  Final Clinical Impressions(s) / ED Diagnoses   Final diagnoses:  Avulsion fracture    ED Discharge Orders    None       Pollina, Canary Brim, MD 08/04/19 [phone]

## 2019-08-04 DIAGNOSIS — M25571 Pain in right ankle and joints of right foot: Secondary | ICD-10-CM | POA: Diagnosis not present

## 2019-08-04 DIAGNOSIS — M79671 Pain in right foot: Secondary | ICD-10-CM | POA: Diagnosis not present

## 2019-08-04 DIAGNOSIS — S99921A Unspecified injury of right foot, initial encounter: Secondary | ICD-10-CM | POA: Diagnosis not present

## 2019-08-04 NOTE — ED Notes (Signed)
icepack given to pt for home use

## 2019-08-08 ENCOUNTER — Encounter: Payer: Self-pay | Admitting: Orthopaedic Surgery

## 2019-08-08 ENCOUNTER — Other Ambulatory Visit: Payer: Self-pay

## 2019-08-08 ENCOUNTER — Ambulatory Visit: Payer: PPO | Admitting: Orthopaedic Surgery

## 2019-08-08 VITALS — BP 116/74 | HR 87 | Temp 96.4°F | Ht 60.0 in | Wt 128.0 lb

## 2019-08-08 DIAGNOSIS — S92154A Nondisplaced avulsion fracture (chip fracture) of right talus, initial encounter for closed fracture: Secondary | ICD-10-CM | POA: Diagnosis not present
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
## 2019-08-20 DIAGNOSIS — M1990 Unspecified osteoarthritis, unspecified site: Secondary | ICD-10-CM | POA: Diagnosis not present

## 2019-08-20 DIAGNOSIS — I2510 Atherosclerotic heart disease of native coronary artery without angina pectoris: Secondary | ICD-10-CM | POA: Diagnosis not present

## 2019-08-22 ENCOUNTER — Ambulatory Visit (INDEPENDENT_AMBULATORY_CARE_PROVIDER_SITE_OTHER): Payer: PPO | Admitting: Orthopaedic Surgery

## 2019-08-22 ENCOUNTER — Telehealth: Payer: Self-pay | Admitting: Orthopaedic Surgery

## 2019-08-22 ENCOUNTER — Other Ambulatory Visit: Payer: Self-pay

## 2019-08-22 ENCOUNTER — Encounter: Payer: Self-pay | Admitting: Orthopaedic Surgery

## 2019-08-22 ENCOUNTER — Ambulatory Visit: Payer: PPO

## 2019-08-22 VITALS — BP 166/83 | HR 109 | Temp 97.1°F | Ht 60.0 in | Wt 128.0 lb

## 2019-08-22 DIAGNOSIS — S92154D Nondisplaced avulsion fracture (chip fracture) of right talus, subsequent encounter for fracture with routine healing: Secondary | ICD-10-CM | POA: Diagnosis not present
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
## 2019-08-22 NOTE — Progress Notes (Signed)
CC:  I feel better  She has been using her CAM walker on the right. She has little pain. She has been out of it in the house.  I told her to gradually come out and walk without the CAM walker as tolerated.  Return in one month.  X-rays were done of the right ankle, reported separately.  Encounter Diagnosis  Name Primary?  . Closed nondisplaced avulsion fracture of right talus with routine healing Yes   Call if any problem.  Precautions discussed.   Electronically Signed Sanjuana Kava, MD 12/1/202010:27 AM

## 2019-08-23 ENCOUNTER — Other Ambulatory Visit: Payer: Self-pay | Admitting: Internal Medicine

## 2019-08-23 DIAGNOSIS — Z1231 Encounter for screening mammogram for malignant neoplasm of breast: Secondary | ICD-10-CM

## 2019-09-12 DIAGNOSIS — M1380 Other specified arthritis, unspecified site: Secondary | ICD-10-CM | POA: Diagnosis not present

## 2019-09-12 DIAGNOSIS — I2510 Atherosclerotic heart disease of native coronary artery without angina pectoris: Secondary | ICD-10-CM | POA: Diagnosis not present

## 2019-09-12 DIAGNOSIS — I10 Essential (primary) hypertension: Secondary | ICD-10-CM | POA: Diagnosis not present

## 2019-09-19 ENCOUNTER — Encounter: Payer: Self-pay | Admitting: Orthopaedic Surgery

## 2019-09-19 ENCOUNTER — Other Ambulatory Visit: Payer: Self-pay

## 2019-09-19 ENCOUNTER — Ambulatory Visit (INDEPENDENT_AMBULATORY_CARE_PROVIDER_SITE_OTHER): Payer: PPO | Admitting: Orthopaedic Surgery

## 2019-09-19 VITALS — Temp 97.0°F | Ht 60.0 in | Wt 128.0 lb

## 2019-09-19 DIAGNOSIS — S92154D Nondisplaced avulsion fracture (chip fracture) of right talus, subsequent encounter for fracture with routine healing: Secondary | ICD-10-CM

## 2019-09-19 NOTE — Progress Notes (Signed)
I am doing OK  She is walking well with no pain of the ankle on the right.  NV intact.  ROM is full.    Discharge.  See as needed.  Call if any problem.  Precautions discussed.   Electronically Signed Sanjuana Kava, MD 12/29/202010:47 AM

## 2019-10-13 DIAGNOSIS — I10 Essential (primary) hypertension: Secondary | ICD-10-CM | POA: Diagnosis not present

## 2019-10-13 DIAGNOSIS — I2510 Atherosclerotic heart disease of native coronary artery without angina pectoris: Secondary | ICD-10-CM | POA: Diagnosis not present

## 2019-10-31 ENCOUNTER — Encounter: Payer: Self-pay | Admitting: Orthopaedic Surgery

## 2019-10-31 ENCOUNTER — Ambulatory Visit: Payer: Medicare Other | Admitting: Orthopaedic Surgery

## 2019-10-31 ENCOUNTER — Ambulatory Visit: Payer: Medicare Other

## 2019-10-31 ENCOUNTER — Other Ambulatory Visit: Payer: Self-pay

## 2019-10-31 VITALS — BP 129/63 | HR 72 | Temp 96.8°F | Ht 60.0 in | Wt 120.0 lb

## 2019-10-31 DIAGNOSIS — M25531 Pain in right wrist: Secondary | ICD-10-CM

## 2019-10-31 DIAGNOSIS — G8929 Other chronic pain: Secondary | ICD-10-CM | POA: Diagnosis not present

## 2019-10-31 MED ORDER — PREDNISONE 5 MG (21) PO TBPK: ORAL_TABLET | ORAL | 0 refills | Status: DC

## 2019-10-31 NOTE — Progress Notes (Signed)
Patient YQ:MVHQION [name], female DOB:Apr 23, 1951, 69 y.o. GEX:[id number]  Chief Complaint  Patient presents with  . Hand Pain    R/hurting and swelling for about 2 weeks     HPI  [name] is a 69 y.o. female who has developed pain of the right wrist over the last two weeks.  She has had some swelling but no redness. She has no trauma. The swelling has gone down. She has no trauma.  She used a small compression glove and it helped.  She has no other joint pains. She denies history of gout.  She has no numbness. The swelling has gone out of it for the last two days.   Body mass index is 23.44 kg/m.  ROS  Review of Systems  Constitutional: Positive for activity change.  Musculoskeletal: Positive for arthralgias, gait problem and joint swelling.  All other systems reviewed and are negative.   All other systems reviewed and are negative.  The following is a summary of the past history medically, past history surgically, known current medicines, social history and family history.  This information is gathered electronically by the computer from prior information and documentation.  I review this each visit and have found including this information at this point in the chart is beneficial and informative.    Past Medical History:  Diagnosis Date  . Anxiety   . Blind    right  . Colitis 09/07/2011  . CVA (cerebral infarction)   . GERD (gastroesophageal reflux disease)   . Hyperlipidemia   . Hypertension   . Myocardial infarct ([place])    1997, 2004  . 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 Stroke [facility]) 2002    Past Surgical History:  Procedure Laterality Date  . ABDOMINAL HYSTERECTOMY    . BILATERAL SALPINGOOPHORECTOMY    . COLONOSCOPY N/A 07/17/2014   RMR: Extremely redundant colon. Colonic diverticulosis. Inadequate prepartation precluded examination of all the rectal and colonic neoplasm. I suspect slow colonic transit in the setting of a  markely redundant colon.  . CORONARY ANGIOPLASTY WITH STENT PLACEMENT  0102,7253  . cyst removed from left wrist    . ESOPHAGOGASTRODUODENOSCOPY  09/05/2004   Dr Chucky May gastritis, candida esophagitis  . ESOPHAGOGASTRODUODENOSCOPY  09/07/2011   GUY:[id number], esophageal in the distal esophagus/Mild gastritis  . Lumps removed from each breast      Family History  Problem Relation Age of Onset  . Multiple sclerosis Father   . Alzheimer's disease Mother   . Hypertension Mother   . Diabetes Mother   . Colon cancer Neg Hx     Social History Social History   Tobacco Use  . Smoking status: Former Smoker    Packs/day: 0.25    Years: 40.00    Pack years: 10.00    Types: Cigarettes    Quit date: 08/02/2017    Years since quitting: 2.2  . Smokeless tobacco: Never Used  Substance Use Topics  . Alcohol use: No    Alcohol/week: 0.0 standard drinks  . Drug use: No    Allergies  Allergen Reactions  . Penicillins Anaphylaxis  . Aspirin Swelling and Other (See Comments)    Only in large doses will cause a reaction  . Bee Venom Swelling    Severe swelling  . Contrast Media [Iodinated Diagnostic Agents] Swelling  . Dilaudid [Hydromorphone Hcl] Itching  . Lisinopril Swelling  . Motrin [Ibuprofen] Swelling    Current Outpatient Medications  Medication Sig Dispense  Refill  . amLODipine (NORVASC) 10 MG tablet Take 10 mg by mouth daily.    Marland Kitchen aspirin EC 81 MG tablet Take 81 mg by mouth daily.      . cloNIDine (CATAPRES) 0.3 MG tablet Take 0.3 mg by mouth 2 (two) times daily.    Marland Kitchen dextromethorphan-guaiFENesin (MUCINEX DM) 30-600 MG per 12 hr tablet Take 1 tablet by mouth 2 (two) times daily.    . diphenhydrAMINE (BENADRYL) 25 MG tablet Take 1 tablet (25 mg total) by mouth every 8 (eight) hours as needed (Tinnitus). 10 tablet 0  . docusate sodium (COLACE) 100 MG capsule Take 100 mg by mouth daily.      . furosemide (LASIX) 20 MG tablet Take 20 mg by mouth daily.    . Linaclotide  (LINZESS) 145 MCG CAPS capsule Take 1 capsule (145 mcg total) by mouth daily. 30 capsule 3  . lubiprostone (AMITIZA) 24 MCG capsule Take 1 capsule (24 mcg total) by mouth 2 (two) times daily with a meal. 60 capsule 11  . lubiprostone (AMITIZA) 8 MCG capsule Take 1 capsule (8 mcg total) by mouth 2 (two) times daily with a meal. (Patient not taking: Reported on 08/21/2014) 60 capsule 3  . metoprolol (LOPRESSOR) 50 MG tablet Take 1 tablet (50 mg total) by mouth 2 (two) times daily. 60 tablet 3  . omeprazole (PRILOSEC) 20 MG capsule TAKE 1 CAPSULE BY MOUTH EVERY DAY 90 capsule 3  . polyethylene glycol powder (GLYCOLAX/MIRALAX) powder Take 17 g by mouth daily.    . predniSONE (STERAPRED UNI-PAK 21 TAB) 5 MG (21) TBPK tablet Take 6 pills first day; 5 pills second day; 4 pills third day; 3 pills fourth day; 2 pills next day and 1 pill last day. 21 tablet 0  . simvastatin (ZOCOR) 40 MG tablet Take 1 tablet (40 mg total) by mouth daily. 30 tablet 3  . traMADol (ULTRAM) 50 MG tablet Take 50 mg by mouth every 6 (six) hours as needed for moderate pain.     No current facility-administered medications for this visit.     Physical Exam  Blood pressure 129/63, pulse 72, temperature (!) 96.8 F (36 C), height 5' (1.524 m), weight 120 lb (54.4 kg).  Constitutional: overall normal hygiene, normal nutrition, well developed, normal grooming, normal body habitus. Assistive device:none  Musculoskeletal: gait and station Limp none, muscle tone and strength are normal, no tremors or atrophy is present.  .  Neurological: coordination overall normal.  Deep tendon reflex/nerve stretch intact.  Sensation normal.  Cranial nerves II-XII intact.   Skin:   Normal overall no scars, lesions, ulcers or rashes. No psoriasis.  Psychiatric: Alert and oriented x 3.  Recent memory intact, remote memory unclear.  Normal mood and affect. Well groomed.  Good eye contact.  Cardiovascular: overall no swelling, no varicosities, no  edema bilaterally, normal temperatures of the legs and arms, no clubbing, cyanosis and good capillary refill.  Lymphatic: palpation is normal.  Right wrist is tender, more dorsally.  She has good motion, no redness, no swelling.  Grips good.  NV intact.    All other systems reviewed and are negative   The patient has been educated about the nature of the problem(s) and counseled on treatment options.  The patient appeared to understand what I have discussed and is in agreement with it.  Encounter Diagnosis  Name Primary?  . Chronic pain of right wrist Yes   X-rays were done of the right wrist, reported separately.  PLAN  Call if any problems.  Precautions discussed.  Continue current medications. Begin Prednisone dose pack.  Return to clinic 1 week   I have given cock-up splint to use.  Electronically Signed Darreld Mclean, MD 2/9/20212:48 PM

## 2019-11-07 ENCOUNTER — Encounter: Payer: Self-pay | Admitting: Orthopaedic Surgery

## 2019-11-07 ENCOUNTER — Ambulatory Visit: Payer: Medicare Other | Admitting: Orthopaedic Surgery

## 2019-11-07 ENCOUNTER — Other Ambulatory Visit: Payer: Self-pay

## 2019-11-07 VITALS — BP 111/66 | HR 78 | Temp 97.0°F | Ht 60.0 in | Wt 120.0 lb

## 2019-11-07 DIAGNOSIS — M25531 Pain in right wrist: Secondary | ICD-10-CM

## 2019-11-07 DIAGNOSIS — G8929 Other chronic pain: Secondary | ICD-10-CM

## 2019-11-07 MED ORDER — HYDROCODONE-ACETAMINOPHEN 5-325 MG PO TABS: ORAL_TABLET | ORAL | 0 refills | Status: DC

## 2019-11-07 NOTE — Progress Notes (Signed)
Patient [name], female DOB:10/24/1950, 69 y.o. URK:[id number]  Chief Complaint  Patient presents with  . Wrist Pain    Chronic pain of right wrist.    HPI  [name] is a 69 y.o. female who has continued pain of the right wrist.  She has been using the cock-up splint.  She took the prednisone and said it did not help much.  She has stiffness and pain.  She has no redness or swelling or numbness. ROM is painful. She has no trauma.   Body mass index is 23.44 kg/m.  ROS  Review of Systems  Constitutional: Positive for activity change.  Musculoskeletal: Positive for arthralgias, gait problem and joint swelling.  All other systems reviewed and are negative.   All other systems reviewed and are negative.  The following is a summary of the past history medically, past history surgically, known current medicines, social history and family history.  This information is gathered electronically by the computer from prior information and documentation.  I review this each visit and have found including this information at this point in the chart is beneficial and informative.    Past Medical History:  Diagnosis Date  . Anxiety   . Blind    right  . Colitis 09/07/2011  . CVA (cerebral infarction)   . GERD (gastroesophageal reflux disease)   . Hyperlipidemia   . Hypertension   . Myocardial infarct ([facility])    1997, 2004  . Severe major depression with psychotic features ([facility])    2004  . Stroke [facility]) 2002    Past Surgical History:  Procedure Laterality Date  . ABDOMINAL HYSTERECTOMY    . BILATERAL SALPINGOOPHORECTOMY    . COLONOSCOPY N/A 07/17/2014   RMR: Extremely redundant colon. Colonic diverticulosis. Inadequate prepartation precluded examination of all the rectal and colonic neoplasm. I suspect slow colonic transit in the setting of a markely redundant colon.  . CORONARY ANGIOPLASTY WITH STENT PLACEMENT  8315,1761  . cyst removed from left wrist    .  ESOPHAGOGASTRODUODENOSCOPY  09/05/2004   Dr Jimmye Norman gastritis, candida esophagitis  . ESOPHAGOGASTRODUODENOSCOPY  09/07/2011   YWV:PXTG, esophageal in the distal esophagus/Mild gastritis  . Lumps removed from each breast      Family History  Problem Relation Age of Onset  . Multiple sclerosis Father   . Alzheimer's disease Mother   . Hypertension Mother   . Diabetes Mother   . Colon cancer Neg Hx     Social History Social History   Tobacco Use  . Smoking status: Former Smoker    Packs/day: 0.25    Years: 40.00    Pack years: 10.00    Types: Cigarettes    Quit date: 08/02/2017    Years since quitting: 2.2  . Smokeless tobacco: Never Used  Substance Use Topics  . Alcohol use: No    Alcohol/week: 0.0 standard drinks  . Drug use: No    Allergies  Allergen Reactions  . Penicillins Anaphylaxis  . Aspirin Swelling and Other (See Comments)    Only in large doses will cause a reaction  . Bee Venom Swelling    Severe swelling  . Contrast Media [Iodinated Diagnostic Agents] Swelling  . Dilaudid [Hydromorphone Hcl] Itching  . Lisinopril Swelling  . Motrin [Ibuprofen] Swelling    Current Outpatient Medications  Medication Sig Dispense Refill  . amLODipine (NORVASC) 10 MG tablet Take 10 mg by mouth daily.    Marland Kitchen aspirin EC 81 MG tablet Take 81 mg by  mouth daily.      . cloNIDine (CATAPRES) 0.3 MG tablet Take 0.3 mg by mouth 2 (two) times daily.    Marland Kitchen dextromethorphan-guaiFENesin (MUCINEX DM) 30-600 MG per 12 hr tablet Take 1 tablet by mouth 2 (two) times daily.    . diphenhydrAMINE (BENADRYL) 25 MG tablet Take 1 tablet (25 mg total) by mouth every 8 (eight) hours as needed (Tinnitus). 10 tablet 0  . docusate sodium (COLACE) 100 MG capsule Take 100 mg by mouth daily.      . furosemide (LASIX) 20 MG tablet Take 20 mg by mouth daily.    Marland Kitchen HYDROcodone-acetaminophen (NORCO/VICODIN) 5-325 MG tablet One tablet every four hours for pain. 30 tablet 0  . Linaclotide (LINZESS) 145  MCG CAPS capsule Take 1 capsule (145 mcg total) by mouth daily. 30 capsule 3  . lubiprostone (AMITIZA) 24 MCG capsule Take 1 capsule (24 mcg total) by mouth 2 (two) times daily with a meal. 60 capsule 11  . lubiprostone (AMITIZA) 8 MCG capsule Take 1 capsule (8 mcg total) by mouth 2 (two) times daily with a meal. (Patient not taking: Reported on 08/21/2014) 60 capsule 3  . metoprolol (LOPRESSOR) 50 MG tablet Take 1 tablet (50 mg total) by mouth 2 (two) times daily. 60 tablet 3  . omeprazole (PRILOSEC) 20 MG capsule TAKE 1 CAPSULE BY MOUTH EVERY DAY 90 capsule 3  . polyethylene glycol powder (GLYCOLAX/MIRALAX) powder Take 17 g by mouth daily.    . predniSONE (STERAPRED UNI-PAK 21 TAB) 5 MG (21) TBPK tablet Take 6 pills first day; 5 pills second day; 4 pills third day; 3 pills fourth day; 2 pills next day and 1 pill last day. 21 tablet 0  . simvastatin (ZOCOR) 40 MG tablet Take 1 tablet (40 mg total) by mouth daily. 30 tablet 3  . traMADol (ULTRAM) 50 MG tablet Take 50 mg by mouth every 6 (six) hours as needed for moderate pain.     No current facility-administered medications for this visit.     Physical Exam  Blood pressure 111/66, pulse 78, temperature (!) 97 F (36.1 C), height 5' (1.524 m), weight 120 lb (54.4 kg).  Constitutional: overall normal hygiene, normal nutrition, well developed, normal grooming, normal body habitus. Assistive device:right cock-up splint  Musculoskeletal: gait and station Limp none, muscle tone and strength are normal, no tremors or atrophy is present.  .  Neurological: coordination overall normal.  Deep tendon reflex/nerve stretch intact.  Sensation normal.  Cranial nerves II-XII intact.   Skin:   Normal overall no scars, lesions, ulcers or rashes. No psoriasis.  Psychiatric: Alert and oriented x 3.  Recent memory intact, remote memory unclear.  Normal mood and affect. Well groomed.  Good eye contact.  Cardiovascular: overall no swelling, no varicosities, no  edema bilaterally, normal temperatures of the legs and arms, no clubbing, cyanosis and good capillary refill.  Lymphatic: palpation is normal.  Right wrist is tender but has good motion.  NV intact.   She has no swelling.  All other systems reviewed and are negative   The patient has been educated about the nature of the problem(s) and counseled on treatment options.  The patient appeared to understand what I have discussed and is in agreement with it.  Encounter Diagnosis  Name Primary?  . Chronic pain of right wrist Yes    PLAN Call if any problems.  Precautions discussed.  Continue current medications.   Return to clinic 2 weeks   I have reviewed  the [facility] Controlled Substance Reporting System web site prior to prescribing narcotic medicine for this patient.   Electronically Signed Sanjuana Kava, MD 2/16/20213:26 PM

## 2019-11-13 DIAGNOSIS — I10 Essential (primary) hypertension: Secondary | ICD-10-CM | POA: Diagnosis not present

## 2019-11-13 DIAGNOSIS — I2510 Atherosclerotic heart disease of native coronary artery without angina pectoris: Secondary | ICD-10-CM | POA: Diagnosis not present

## 2019-11-21 ENCOUNTER — Ambulatory Visit: Payer: Medicare Other | Admitting: Orthopaedic Surgery

## 2019-12-11 DIAGNOSIS — I2510 Atherosclerotic heart disease of native coronary artery without angina pectoris: Secondary | ICD-10-CM | POA: Diagnosis not present

## 2019-12-11 DIAGNOSIS — E7849 Other hyperlipidemia: Secondary | ICD-10-CM | POA: Diagnosis not present

## 2020-01-15 DIAGNOSIS — I10 Essential (primary) hypertension: Secondary | ICD-10-CM | POA: Diagnosis not present

## 2020-01-15 DIAGNOSIS — I2510 Atherosclerotic heart disease of native coronary artery without angina pectoris: Secondary | ICD-10-CM | POA: Diagnosis not present

## 2020-01-23 ENCOUNTER — Ambulatory Visit: Payer: Medicare Other | Admitting: Orthopaedic Surgery

## 2020-01-23 ENCOUNTER — Encounter: Payer: Self-pay | Admitting: Orthopaedic Surgery

## 2020-01-23 ENCOUNTER — Other Ambulatory Visit: Payer: Self-pay

## 2020-01-23 VITALS — BP 128/67 | HR 75 | Temp 97.7°F | Ht 60.0 in | Wt 119.0 lb

## 2020-01-23 DIAGNOSIS — G8929 Other chronic pain: Secondary | ICD-10-CM

## 2020-01-23 DIAGNOSIS — M25531 Pain in right wrist: Secondary | ICD-10-CM | POA: Diagnosis not present

## 2020-01-23 MED ORDER — HYDROCODONE-ACETAMINOPHEN 5-325 MG PO TABS: ORAL_TABLET | ORAL | 0 refills | Status: DC

## 2020-01-23 NOTE — Progress Notes (Signed)
Patient ZO:XWRUEAV [name], female DOB:1951/02/20, 69 y.o. WUJ:[id number]  Chief Complaint  Patient presents with  . Wrist Pain    Rt wrist    HPI  [name] is a 69 y.o. female who has pain in the hand on the right with numbness now more ulnar side and some of mid hand.  She has some swelling at times.  She has been on prednisone with no help in the past.  I will get EMGs.  I will renew her pain medicine.   Body mass index is 23.24 kg/m.  ROS  Review of Systems  Constitutional: Positive for activity change.  Musculoskeletal: Positive for arthralgias, gait problem and joint swelling.  All other systems reviewed and are negative.   All other systems reviewed and are negative.  The following is a summary of the past history medically, past history surgically, known current medicines, social history and family history.  This information is gathered electronically by the computer from prior information and documentation.  I review this each visit and have found including this information at this point in the chart is beneficial and informative.    Past Medical History:  Diagnosis Date  . Anxiety   . Blind    right  . Colitis 09/07/2011  . CVA (cerebral infarction)   . GERD (gastroesophageal reflux disease)   . Hyperlipidemia   . Hypertension   . Myocardial infarct ([place])    1997, 2004  . Severe major depression with psychotic features ([place])    2004  . Stroke [facility]) 2002    Past Surgical History:  Procedure Laterality Date  . ABDOMINAL HYSTERECTOMY    . BILATERAL SALPINGOOPHORECTOMY    . COLONOSCOPY N/A 07/17/2014   RMR: Extremely redundant colon. Colonic diverticulosis. Inadequate prepartation precluded examination of all the rectal and colonic neoplasm. I suspect slow colonic transit in the setting of a markely redundant colon.  . CORONARY ANGIOPLASTY WITH STENT PLACEMENT  9562,1308  . cyst removed from left wrist    . ESOPHAGOGASTRODUODENOSCOPY  09/05/2004   Dr [name] gastritis, candida esophagitis  . ESOPHAGOGASTRODUODENOSCOPY  09/07/2011   [facility]:QION, esophageal in the distal esophagus/Mild gastritis  . Lumps removed from each breast      Family History  Problem Relation Age of Onset  . Multiple sclerosis Father   . Alzheimer's disease Mother   . Hypertension Mother   . Diabetes Mother   . Colon cancer Neg Hx     Social History Social History   Tobacco Use  . Smoking status: Former Smoker    Packs/day: 0.25    Years: 40.00    Pack years: 10.00    Types: Cigarettes    Quit date: 08/02/2017    Years since quitting: 2.4  . Smokeless tobacco: Never Used  Substance Use Topics  . Alcohol use: No    Alcohol/week: 0.0 standard drinks  . Drug use: No    Allergies  Allergen Reactions  . Penicillins Anaphylaxis  . Aspirin Swelling and Other (See Comments)    Only in large doses will cause a reaction  . Bee Venom Swelling    Severe swelling  . Contrast Media [Iodinated Diagnostic Agents] Swelling  . Dilaudid [Hydromorphone Hcl] Itching  . Lisinopril Swelling  . Motrin [Ibuprofen] Swelling    Current Outpatient Medications  Medication Sig Dispense Refill  . albuterol (VENTOLIN HFA) 108 (90 Base) MCG/ACT inhaler Inhale into the lungs.    Marland Kitchen aspirin EC 81 MG tablet Take 81 mg by mouth daily.      Marland Kitchen  dextromethorphan-guaiFENesin (MUCINEX DM) 30-600 MG per 12 hr tablet Take 1 tablet by mouth 2 (two) times daily.    . diphenhydrAMINE (BENADRYL) 25 MG tablet Take 1 tablet (25 mg total) by mouth every 8 (eight) hours as needed (Tinnitus). 10 tablet 0  . docusate sodium (COLACE) 100 MG capsule Take 100 mg by mouth daily.      . furosemide (LASIX) 20 MG tablet Take 20 mg by mouth daily.    Marland Kitchen HYDROcodone-acetaminophen (NORCO/VICODIN) 5-325 MG tablet One tablet every four hours for pain. 30 tablet 0  . lubiprostone (AMITIZA) 24 MCG capsule Take 1 capsule (24 mcg total) by mouth 2 (two) times daily with a meal. 60 capsule 11  .  lubiprostone (AMITIZA) 8 MCG capsule Take 1 capsule (8 mcg total) by mouth 2 (two) times daily with a meal. (Patient not taking: Reported on 08/21/2014) 60 capsule 3  . omeprazole (PRILOSEC) 40 MG capsule     . polyethylene glycol powder (GLYCOLAX/MIRALAX) powder Take 17 g by mouth daily.    . rosuvastatin (CRESTOR) 5 MG tablet Take by mouth.    . simvastatin (ZOCOR) 40 MG tablet Take 1 tablet (40 mg total) by mouth daily. 30 tablet 3  . traMADol (ULTRAM) 50 MG tablet Take 50 mg by mouth every 6 (six) hours as needed for moderate pain.     No current facility-administered medications for this visit.     Physical Exam  Blood pressure 128/67, pulse 75, temperature 97.7 F (36.5 C), height 5' (1.524 m), weight 119 lb (54 kg).  Constitutional: overall normal hygiene, normal nutrition, well developed, normal grooming, normal body habitus. Assistive device:none  Musculoskeletal: gait and station Limp none, muscle tone and strength are normal, no tremors or atrophy is present.  .  Neurological: coordination overall normal.  Deep tendon reflex/nerve stretch intact.  Sensation normal.  Cranial nerves II-XII intact.   Skin:   Normal overall no scars, lesions, ulcers or rashes. No psoriasis.  Psychiatric: Alert and oriented x 3.  Recent memory intact, remote memory unclear.  Normal mood and affect. Well groomed.  Good eye contact.  Cardiovascular: overall no swelling, no varicosities, no edema bilaterally, normal temperatures of the legs and arms, no clubbing, cyanosis and good capillary refill.  Lymphatic: palpation is normal.  She has right hand pain today, more laterally with decreased motion of the fingers. She complains of numbness of the little finger and long finger.  Pulses normal.  All other systems reviewed and are negative   The patient has been educated about the nature of the problem(s) and counseled on treatment options.  The patient appeared to understand what I have discussed  and is in agreement with it.  Encounter Diagnosis  Name Primary?  . Chronic pain of right wrist Yes    PLAN Call if any problems.  Precautions discussed.  Continue current medications.   Return to clinic 2 weeks   Get EMGs.   Electronically Signed Darreld Mclean, MD 5/4/20213:11 PM

## 2020-02-01 ENCOUNTER — Telehealth: Payer: Self-pay | Admitting: Orthopaedic Surgery

## 2020-02-01 NOTE — Telephone Encounter (Signed)
Ms. [name] called and stated that she wanted to cancel her appointment here for the 18th.  She also doesn't want to the "testing" right now because she has other things going on.  She will let us know when she is ready to move forward.  Do you need to let Irving Burton at Dr Ronal Fear office know that patient doesn't want appointment right now?

## 2020-02-06 ENCOUNTER — Ambulatory Visit: Payer: Medicare Other | Admitting: Orthopaedic Surgery

## 2020-02-14 DIAGNOSIS — I10 Essential (primary) hypertension: Secondary | ICD-10-CM | POA: Diagnosis not present

## 2020-02-14 DIAGNOSIS — I2510 Atherosclerotic heart disease of native coronary artery without angina pectoris: Secondary | ICD-10-CM | POA: Diagnosis not present

## 2020-03-16 DIAGNOSIS — E7849 Other hyperlipidemia: Secondary | ICD-10-CM | POA: Diagnosis not present

## 2020-03-16 DIAGNOSIS — I2510 Atherosclerotic heart disease of native coronary artery without angina pectoris: Secondary | ICD-10-CM | POA: Diagnosis not present

## 2020-04-12 DIAGNOSIS — Z1389 Encounter for screening for other disorder: Secondary | ICD-10-CM | POA: Diagnosis not present

## 2020-04-12 DIAGNOSIS — K589 Irritable bowel syndrome without diarrhea: Secondary | ICD-10-CM | POA: Diagnosis not present

## 2020-04-12 DIAGNOSIS — J410 Simple chronic bronchitis: Secondary | ICD-10-CM | POA: Diagnosis not present

## 2020-04-12 DIAGNOSIS — Z0001 Encounter for general adult medical examination with abnormal findings: Secondary | ICD-10-CM | POA: Diagnosis not present

## 2020-04-12 DIAGNOSIS — I2510 Atherosclerotic heart disease of native coronary artery without angina pectoris: Secondary | ICD-10-CM | POA: Diagnosis not present

## 2020-05-13 DIAGNOSIS — I10 Essential (primary) hypertension: Secondary | ICD-10-CM | POA: Diagnosis not present

## 2020-05-13 DIAGNOSIS — I2510 Atherosclerotic heart disease of native coronary artery without angina pectoris: Secondary | ICD-10-CM | POA: Diagnosis not present

## 2020-06-13 DIAGNOSIS — R6889 Other general symptoms and signs: Secondary | ICD-10-CM | POA: Diagnosis not present

## 2020-06-30 DIAGNOSIS — I2510 Atherosclerotic heart disease of native coronary artery without angina pectoris: Secondary | ICD-10-CM | POA: Diagnosis not present

## 2020-06-30 DIAGNOSIS — I10 Essential (primary) hypertension: Secondary | ICD-10-CM | POA: Diagnosis not present

## 2020-07-31 DIAGNOSIS — I10 Essential (primary) hypertension: Secondary | ICD-10-CM | POA: Diagnosis not present

## 2020-07-31 DIAGNOSIS — I2510 Atherosclerotic heart disease of native coronary artery without angina pectoris: Secondary | ICD-10-CM | POA: Diagnosis not present

## 2020-08-30 DIAGNOSIS — I2510 Atherosclerotic heart disease of native coronary artery without angina pectoris: Secondary | ICD-10-CM | POA: Diagnosis not present

## 2020-08-30 DIAGNOSIS — I10 Essential (primary) hypertension: Secondary | ICD-10-CM | POA: Diagnosis not present

## 2020-09-16 DIAGNOSIS — L638 Other alopecia areata: Secondary | ICD-10-CM | POA: Diagnosis not present

## 2020-09-16 DIAGNOSIS — L648 Other androgenic alopecia: Secondary | ICD-10-CM | POA: Diagnosis not present

## 2020-09-30 DIAGNOSIS — Z1389 Encounter for screening for other disorder: Secondary | ICD-10-CM | POA: Diagnosis not present

## 2020-09-30 DIAGNOSIS — I10 Essential (primary) hypertension: Secondary | ICD-10-CM | POA: Diagnosis not present

## 2020-09-30 DIAGNOSIS — Z0001 Encounter for general adult medical examination with abnormal findings: Secondary | ICD-10-CM | POA: Diagnosis not present

## 2020-09-30 DIAGNOSIS — R7303 Prediabetes: Secondary | ICD-10-CM | POA: Diagnosis not present

## 2020-09-30 DIAGNOSIS — J410 Simple chronic bronchitis: Secondary | ICD-10-CM | POA: Diagnosis not present

## 2020-09-30 DIAGNOSIS — I2510 Atherosclerotic heart disease of native coronary artery without angina pectoris: Secondary | ICD-10-CM | POA: Diagnosis not present

## 2020-10-31 DIAGNOSIS — F17210 Nicotine dependence, cigarettes, uncomplicated: Secondary | ICD-10-CM | POA: Diagnosis not present

## 2020-10-31 DIAGNOSIS — I10 Essential (primary) hypertension: Secondary | ICD-10-CM | POA: Diagnosis not present

## 2020-11-28 DIAGNOSIS — I10 Essential (primary) hypertension: Secondary | ICD-10-CM | POA: Diagnosis not present

## 2020-11-28 DIAGNOSIS — I2510 Atherosclerotic heart disease of native coronary artery without angina pectoris: Secondary | ICD-10-CM | POA: Diagnosis not present

## 2020-12-23 DIAGNOSIS — L309 Dermatitis, unspecified: Secondary | ICD-10-CM | POA: Diagnosis not present

## 2021-01-02 DIAGNOSIS — H5202 Hypermetropia, left eye: Secondary | ICD-10-CM | POA: Diagnosis not present

## 2021-01-02 DIAGNOSIS — H524 Presbyopia: Secondary | ICD-10-CM | POA: Diagnosis not present

## 2021-01-02 DIAGNOSIS — H47291 Other optic atrophy, right eye: Secondary | ICD-10-CM | POA: Diagnosis not present

## 2021-01-02 DIAGNOSIS — H25813 Combined forms of age-related cataract, bilateral: Secondary | ICD-10-CM | POA: Diagnosis not present

## 2021-01-22 DIAGNOSIS — I10 Essential (primary) hypertension: Secondary | ICD-10-CM | POA: Diagnosis not present

## 2021-01-22 DIAGNOSIS — I2510 Atherosclerotic heart disease of native coronary artery without angina pectoris: Secondary | ICD-10-CM | POA: Diagnosis not present

## 2021-02-22 DIAGNOSIS — J410 Simple chronic bronchitis: Secondary | ICD-10-CM | POA: Diagnosis not present

## 2021-02-22 DIAGNOSIS — I10 Essential (primary) hypertension: Secondary | ICD-10-CM | POA: Diagnosis not present

## 2021-04-01 DIAGNOSIS — K589 Irritable bowel syndrome without diarrhea: Secondary | ICD-10-CM | POA: Diagnosis not present

## 2021-04-01 DIAGNOSIS — I10 Essential (primary) hypertension: Secondary | ICD-10-CM | POA: Diagnosis not present

## 2021-04-01 DIAGNOSIS — R739 Hyperglycemia, unspecified: Secondary | ICD-10-CM | POA: Diagnosis not present

## 2021-04-01 DIAGNOSIS — E785 Hyperlipidemia, unspecified: Secondary | ICD-10-CM | POA: Diagnosis not present

## 2021-04-01 DIAGNOSIS — Z79899 Other long term (current) drug therapy: Secondary | ICD-10-CM | POA: Diagnosis not present

## 2021-04-01 DIAGNOSIS — Z0001 Encounter for general adult medical examination with abnormal findings: Secondary | ICD-10-CM | POA: Diagnosis not present

## 2021-04-01 DIAGNOSIS — I2510 Atherosclerotic heart disease of native coronary artery without angina pectoris: Secondary | ICD-10-CM | POA: Diagnosis not present

## 2021-05-02 DIAGNOSIS — I2510 Atherosclerotic heart disease of native coronary artery without angina pectoris: Secondary | ICD-10-CM | POA: Diagnosis not present

## 2021-05-02 DIAGNOSIS — I10 Essential (primary) hypertension: Secondary | ICD-10-CM | POA: Diagnosis not present

## 2021-06-02 DIAGNOSIS — I10 Essential (primary) hypertension: Secondary | ICD-10-CM | POA: Diagnosis not present

## 2021-06-02 DIAGNOSIS — I2510 Atherosclerotic heart disease of native coronary artery without angina pectoris: Secondary | ICD-10-CM | POA: Diagnosis not present

## 2021-07-02 DIAGNOSIS — I10 Essential (primary) hypertension: Secondary | ICD-10-CM | POA: Diagnosis not present

## 2021-07-02 DIAGNOSIS — M1990 Unspecified osteoarthritis, unspecified site: Secondary | ICD-10-CM | POA: Diagnosis not present

## 2021-08-02 DIAGNOSIS — I10 Essential (primary) hypertension: Secondary | ICD-10-CM | POA: Diagnosis not present

## 2021-08-02 DIAGNOSIS — M1990 Unspecified osteoarthritis, unspecified site: Secondary | ICD-10-CM | POA: Diagnosis not present

## 2021-09-01 DIAGNOSIS — I10 Essential (primary) hypertension: Secondary | ICD-10-CM | POA: Diagnosis not present

## 2021-09-01 DIAGNOSIS — I2510 Atherosclerotic heart disease of native coronary artery without angina pectoris: Secondary | ICD-10-CM | POA: Diagnosis not present

## 2021-10-06 DIAGNOSIS — R7303 Prediabetes: Secondary | ICD-10-CM | POA: Diagnosis not present

## 2021-10-06 DIAGNOSIS — Z0001 Encounter for general adult medical examination with abnormal findings: Secondary | ICD-10-CM | POA: Diagnosis not present

## 2021-10-06 DIAGNOSIS — I10 Essential (primary) hypertension: Secondary | ICD-10-CM | POA: Diagnosis not present

## 2021-10-06 DIAGNOSIS — I2510 Atherosclerotic heart disease of native coronary artery without angina pectoris: Secondary | ICD-10-CM | POA: Diagnosis not present

## 2021-10-06 DIAGNOSIS — Z1389 Encounter for screening for other disorder: Secondary | ICD-10-CM | POA: Diagnosis not present

## 2021-10-09 ENCOUNTER — Emergency Department (HOSPITAL_COMMUNITY): Payer: Medicare Other

## 2021-10-09 ENCOUNTER — Other Ambulatory Visit: Payer: Self-pay

## 2021-10-09 ENCOUNTER — Encounter (HOSPITAL_COMMUNITY): Payer: Self-pay

## 2021-10-09 ENCOUNTER — Inpatient Hospital Stay (HOSPITAL_COMMUNITY)
Admission: EM | Admit: 2021-10-09 | Discharge: 2021-10-16 | DRG: 418 | Disposition: A | Discharge status: Home / Self Care | Payer: Medicare Other | Attending: Internal Medicine | Admitting: Internal Medicine

## 2021-10-09 DIAGNOSIS — I252 Old myocardial infarction: Secondary | ICD-10-CM

## 2021-10-09 DIAGNOSIS — R52 Pain, unspecified: Secondary | ICD-10-CM

## 2021-10-09 DIAGNOSIS — E538 Deficiency of other specified B group vitamins: Secondary | ICD-10-CM | POA: Diagnosis not present

## 2021-10-09 DIAGNOSIS — R109 Unspecified abdominal pain: Secondary | ICD-10-CM | POA: Diagnosis not present

## 2021-10-09 DIAGNOSIS — Z87891 Personal history of nicotine dependence: Secondary | ICD-10-CM

## 2021-10-09 DIAGNOSIS — K8689 Other specified diseases of pancreas: Secondary | ICD-10-CM | POA: Diagnosis not present

## 2021-10-09 DIAGNOSIS — Z880 Allergy status to penicillin: Secondary | ICD-10-CM

## 2021-10-09 DIAGNOSIS — Z79899 Other long term (current) drug therapy: Secondary | ICD-10-CM

## 2021-10-09 DIAGNOSIS — Z20822 Contact with and (suspected) exposure to covid-19: Secondary | ICD-10-CM | POA: Diagnosis not present

## 2021-10-09 DIAGNOSIS — N281 Cyst of kidney, acquired: Secondary | ICD-10-CM | POA: Diagnosis not present

## 2021-10-09 DIAGNOSIS — K8064 Calculus of gallbladder and bile duct with chronic cholecystitis without obstruction: Principal | ICD-10-CM | POA: Diagnosis present

## 2021-10-09 DIAGNOSIS — Z7982 Long term (current) use of aspirin: Secondary | ICD-10-CM | POA: Diagnosis not present

## 2021-10-09 DIAGNOSIS — Z90710 Acquired absence of both cervix and uterus: Secondary | ICD-10-CM | POA: Diagnosis not present

## 2021-10-09 DIAGNOSIS — I2510 Atherosclerotic heart disease of native coronary artery without angina pectoris: Secondary | ICD-10-CM | POA: Diagnosis not present

## 2021-10-09 DIAGNOSIS — I10 Essential (primary) hypertension: Secondary | ICD-10-CM | POA: Diagnosis present

## 2021-10-09 DIAGNOSIS — E876 Hypokalemia: Secondary | ICD-10-CM

## 2021-10-09 DIAGNOSIS — N182 Chronic kidney disease, stage 2 (mild): Secondary | ICD-10-CM | POA: Diagnosis present

## 2021-10-09 DIAGNOSIS — I129 Hypertensive chronic kidney disease with stage 1 through stage 4 chronic kidney disease, or unspecified chronic kidney disease: Secondary | ICD-10-CM | POA: Diagnosis present

## 2021-10-09 DIAGNOSIS — Z833 Family history of diabetes mellitus: Secondary | ICD-10-CM

## 2021-10-09 DIAGNOSIS — Z888 Allergy status to other drugs, medicaments and biological substances status: Secondary | ICD-10-CM

## 2021-10-09 DIAGNOSIS — K8001 Calculus of gallbladder with acute cholecystitis with obstruction: Secondary | ICD-10-CM

## 2021-10-09 DIAGNOSIS — E785 Hyperlipidemia, unspecified: Secondary | ICD-10-CM | POA: Diagnosis not present

## 2021-10-09 DIAGNOSIS — Z955 Presence of coronary angioplasty implant and graft: Secondary | ICD-10-CM

## 2021-10-09 DIAGNOSIS — Z8249 Family history of ischemic heart disease and other diseases of the circulatory system: Secondary | ICD-10-CM

## 2021-10-09 DIAGNOSIS — J439 Emphysema, unspecified: Secondary | ICD-10-CM | POA: Diagnosis not present

## 2021-10-09 DIAGNOSIS — R059 Cough, unspecified: Secondary | ICD-10-CM | POA: Diagnosis not present

## 2021-10-09 DIAGNOSIS — F419 Anxiety disorder, unspecified: Secondary | ICD-10-CM | POA: Diagnosis present

## 2021-10-09 DIAGNOSIS — K8060 Calculus of gallbladder and bile duct with cholecystitis, unspecified, without obstruction: Secondary | ICD-10-CM | POA: Diagnosis not present

## 2021-10-09 DIAGNOSIS — Z886 Allergy status to analgesic agent status: Secondary | ICD-10-CM

## 2021-10-09 DIAGNOSIS — Z820 Family history of epilepsy and other diseases of the nervous system: Secondary | ICD-10-CM | POA: Diagnosis not present

## 2021-10-09 DIAGNOSIS — N179 Acute kidney failure, unspecified: Secondary | ICD-10-CM | POA: Diagnosis not present

## 2021-10-09 DIAGNOSIS — K5909 Other constipation: Secondary | ICD-10-CM | POA: Diagnosis not present

## 2021-10-09 DIAGNOSIS — Z8673 Personal history of transient ischemic attack (TIA), and cerebral infarction without residual deficits: Secondary | ICD-10-CM

## 2021-10-09 DIAGNOSIS — D7589 Other specified diseases of blood and blood-forming organs: Secondary | ICD-10-CM | POA: Diagnosis present

## 2021-10-09 DIAGNOSIS — K835 Biliary cyst: Secondary | ICD-10-CM | POA: Diagnosis not present

## 2021-10-09 DIAGNOSIS — R079 Chest pain, unspecified: Secondary | ICD-10-CM | POA: Diagnosis not present

## 2021-10-09 DIAGNOSIS — Z91041 Radiographic dye allergy status: Secondary | ICD-10-CM

## 2021-10-09 DIAGNOSIS — H5461 Unqualified visual loss, right eye, normal vision left eye: Secondary | ICD-10-CM | POA: Diagnosis present

## 2021-10-09 DIAGNOSIS — K8020 Calculus of gallbladder without cholecystitis without obstruction: Secondary | ICD-10-CM

## 2021-10-09 DIAGNOSIS — K8050 Calculus of bile duct without cholangitis or cholecystitis without obstruction: Secondary | ICD-10-CM | POA: Diagnosis not present

## 2021-10-09 DIAGNOSIS — K8010 Calculus of gallbladder with chronic cholecystitis without obstruction: Secondary | ICD-10-CM | POA: Diagnosis not present

## 2021-10-09 DIAGNOSIS — K219 Gastro-esophageal reflux disease without esophagitis: Secondary | ICD-10-CM | POA: Diagnosis present

## 2021-10-09 DIAGNOSIS — G8929 Other chronic pain: Secondary | ICD-10-CM | POA: Diagnosis present

## 2021-10-09 DIAGNOSIS — Z419 Encounter for procedure for purposes other than remedying health state, unspecified: Secondary | ICD-10-CM

## 2021-10-09 DIAGNOSIS — K829 Disease of gallbladder, unspecified: Secondary | ICD-10-CM | POA: Diagnosis not present

## 2021-10-09 DIAGNOSIS — R9431 Abnormal electrocardiogram [ECG] [EKG]: Secondary | ICD-10-CM | POA: Diagnosis not present

## 2021-10-09 LAB — URINALYSIS, ROUTINE W REFLEX MICROSCOPIC
Bacteria, UA: NONE SEEN
Bilirubin Urine: NEGATIVE
Glucose, UA: NEGATIVE mg/dL
Ketones, ur: 20 mg/dL — AB
Leukocytes,Ua: NEGATIVE
Nitrite: NEGATIVE
Protein, ur: >=300 mg/dL — AB
Specific Gravity, Urine: 1.018 (ref 1.005–1.030)
pH: 6 (ref 5.0–8.0)

## 2021-10-09 LAB — COMPREHENSIVE METABOLIC PANEL
ALT: 17 U/L (ref 0–44)
AST: 27 U/L (ref 15–41)
Albumin: 4.3 g/dL (ref 3.5–5.0)
Alkaline Phosphatase: 112 U/L (ref 38–126)
Anion gap: 14 (ref 5–15)
BUN: 8 mg/dL (ref 8–23)
CO2: 23 mmol/L (ref 22–32)
Calcium: 9.9 mg/dL (ref 8.9–10.3)
Chloride: 106 mmol/L (ref 98–111)
Creatinine, Ser: 1.05 mg/dL — ABNORMAL HIGH (ref 0.44–1.00)
GFR, Estimated: 56 mL/min — ABNORMAL LOW (ref >60)
Glucose, Bld: 172 mg/dL — ABNORMAL HIGH (ref 70–99)
Potassium: 2.7 mmol/L — CL (ref 3.5–5.1)
Sodium: 143 mmol/L (ref 135–145)
Total Bilirubin: 1 mg/dL (ref 0.3–1.2)
Total Protein: 8.4 g/dL — ABNORMAL HIGH (ref 6.5–8.1)

## 2021-10-09 LAB — BASIC METABOLIC PANEL
Anion gap: 11 (ref 5–15)
BUN: 6 mg/dL — ABNORMAL LOW (ref 8–23)
CO2: 22 mmol/L (ref 22–32)
Calcium: 9 mg/dL (ref 8.9–10.3)
Chloride: 106 mmol/L (ref 98–111)
Creatinine, Ser: 0.98 mg/dL (ref 0.44–1.00)
GFR, Estimated: >60 mL/min (ref >60)
Glucose, Bld: 183 mg/dL — ABNORMAL HIGH (ref 70–99)
Potassium: 3.7 mmol/L (ref 3.5–5.1)
Sodium: 139 mmol/L (ref 135–145)

## 2021-10-09 LAB — CBC
HCT: 44.4 % (ref 36.0–46.0)
Hemoglobin: 14.4 g/dL (ref 12.0–15.0)
MCH: 35 pg — ABNORMAL HIGH (ref 26.0–34.0)
MCHC: 32.4 g/dL (ref 30.0–36.0)
MCV: 108 fL — ABNORMAL HIGH (ref 80.0–100.0)
Platelets: 233 10*3/uL (ref 150–400)
RBC: 4.11 MIL/uL (ref 3.87–5.11)
RDW: 12.5 % (ref 11.5–15.5)
WBC: 13.6 10*3/uL — ABNORMAL HIGH (ref 4.0–10.5)
nRBC: 0 % (ref 0.0–0.2)

## 2021-10-09 LAB — LIPASE, BLOOD: Lipase: 23 U/L (ref 11–51)

## 2021-10-09 LAB — RESP PANEL BY RT-PCR (FLU A&B, COVID) ARPGX2
Influenza A by PCR: NEGATIVE
Influenza B by PCR: NEGATIVE
SARS Coronavirus 2 by RT PCR: NEGATIVE

## 2021-10-09 MED ORDER — BISACODYL 10 MG RE SUPP
10.0000 mg | Freq: Two times a day (BID) | RECTAL | Status: DC
Start: 2021-10-09 — End: 2021-10-09

## 2021-10-09 MED ORDER — CIPROFLOXACIN IN D5W 400 MG/200ML IV SOLN
400.0000 mg | Freq: Two times a day (BID) | INTRAVENOUS | Status: DC
  Administered 2021-10-09 – 2021-10-15 (×12): 400 mg via INTRAVENOUS
  Filled 2021-10-09 (×12): qty 200

## 2021-10-09 MED ORDER — AMLODIPINE BESYLATE 5 MG PO TABS
10.0000 mg | ORAL_TABLET | Freq: Every day | ORAL | Status: DC
  Administered 2021-10-09 – 2021-10-11 (×3): 10 mg via ORAL
  Filled 2021-10-09 (×5): qty 2

## 2021-10-09 MED ORDER — POLYETHYLENE GLYCOL 3350 17 G PO PACK
34.0000 g | PACK | Freq: Every day | ORAL | Status: AC
  Administered 2021-10-09 – 2021-10-11 (×2): 34 g via ORAL
  Filled 2021-10-09 (×3): qty 2

## 2021-10-09 MED ORDER — POTASSIUM CHLORIDE CRYS ER 20 MEQ PO TBCR
40.0000 meq | EXTENDED_RELEASE_TABLET | Freq: Once | ORAL | Status: AC
  Administered 2021-10-09: 40 meq via ORAL
  Filled 2021-10-09: qty 2

## 2021-10-09 MED ORDER — BISACODYL 10 MG RE SUPP
10.0000 mg | Freq: Two times a day (BID) | RECTAL | Status: AC
Start: 2021-10-09 — End: 2021-10-11
  Administered 2021-10-09 – 2021-10-10 (×2): 10 mg via RECTAL
  Filled 2021-10-09 (×4): qty 1

## 2021-10-09 MED ORDER — SENNOSIDES-DOCUSATE SODIUM 8.6-50 MG PO TABS
2.0000 | ORAL_TABLET | Freq: Two times a day (BID) | ORAL | Status: DC
  Administered 2021-10-09 – 2021-10-15 (×7): 2 via ORAL
  Filled 2021-10-09 (×14): qty 2

## 2021-10-09 MED ORDER — HEPARIN SODIUM (PORCINE) 5000 UNIT/ML IJ SOLN: 5000.0000 [IU] | Freq: Three times a day (TID) | INTRAMUSCULAR | Status: DC

## 2021-10-09 MED ORDER — PANTOPRAZOLE SODIUM 40 MG PO TBEC
40.0000 mg | DELAYED_RELEASE_TABLET | Freq: Every day | ORAL | Status: DC
  Administered 2021-10-09 – 2021-10-16 (×6): 40 mg via ORAL
  Filled 2021-10-09 (×8): qty 1

## 2021-10-09 MED ORDER — SODIUM CHLORIDE 0.9 % IV SOLN: INTRAVENOUS | Status: DC

## 2021-10-09 MED ORDER — SODIUM CHLORIDE 0.9 % IV BOLUS
500.0000 mL | Freq: Once | INTRAVENOUS | Status: AC
  Administered 2021-10-09: 500 mL via INTRAVENOUS

## 2021-10-09 MED ORDER — CLONIDINE HCL 0.2 MG PO TABS
0.3000 mg | ORAL_TABLET | Freq: Two times a day (BID) | ORAL | Status: DC
  Administered 2021-10-09 – 2021-10-16 (×11): 0.3 mg via ORAL
  Filled 2021-10-09 (×14): qty 1

## 2021-10-09 MED ORDER — HYDROCODONE-ACETAMINOPHEN 5-325 MG PO TABS
1.0000 | ORAL_TABLET | ORAL | Status: DC | PRN
  Administered 2021-10-09: 1 via ORAL
  Administered 2021-10-10: 18:00:00 2 via ORAL
  Administered 2021-10-10 – 2021-10-12 (×4): 1 via ORAL
  Administered 2021-10-15 – 2021-10-16 (×3): 2 via ORAL
  Filled 2021-10-09 (×2): qty 2
  Filled 2021-10-09: qty 1
  Filled 2021-10-09: qty 2
  Filled 2021-10-09 (×4): qty 1
  Filled 2021-10-09: qty 2

## 2021-10-09 MED ORDER — ONDANSETRON HCL 4 MG/2ML IJ SOLN
4.0000 mg | Freq: Once | INTRAMUSCULAR | Status: AC
  Administered 2021-10-09: 4 mg via INTRAVENOUS
  Filled 2021-10-09: qty 2

## 2021-10-09 MED ORDER — LINACLOTIDE 72 MCG PO CAPS
72.0000 ug | ORAL_CAPSULE | Freq: Every day | ORAL | Status: DC
  Administered 2021-10-11 – 2021-10-14 (×4): 72 ug via ORAL
  Filled 2021-10-09 (×7): qty 1

## 2021-10-09 MED ORDER — FENTANYL CITRATE PF 50 MCG/ML IJ SOSY
25.0000 ug | PREFILLED_SYRINGE | Freq: Once | INTRAMUSCULAR | Status: AC
  Administered 2021-10-09: 25 ug via INTRAVENOUS
  Filled 2021-10-09: qty 1

## 2021-10-09 MED ORDER — POTASSIUM CHLORIDE 10 MEQ/100ML IV SOLN
10.0000 meq | INTRAVENOUS | Status: AC
  Administered 2021-10-09 (×4): 10 meq via INTRAVENOUS
  Filled 2021-10-09: qty 100

## 2021-10-09 MED ORDER — MORPHINE SULFATE (PF) 2 MG/ML IV SOLN
1.0000 mg | INTRAVENOUS | Status: DC | PRN
  Administered 2021-10-13 – 2021-10-15 (×7): 1 mg via INTRAVENOUS
  Filled 2021-10-09 (×7): qty 1

## 2021-10-09 MED ORDER — METRONIDAZOLE 500 MG/100ML IV SOLN
500.0000 mg | Freq: Three times a day (TID) | INTRAVENOUS | Status: DC
  Administered 2021-10-09 – 2021-10-15 (×18): 500 mg via INTRAVENOUS
  Filled 2021-10-09 (×18): qty 100

## 2021-10-09 MED ORDER — ACETAMINOPHEN 650 MG RE SUPP: 650.0000 mg | Freq: Four times a day (QID) | RECTAL | Status: DC | PRN

## 2021-10-09 MED ORDER — ACETAMINOPHEN 325 MG PO TABS: 650.0000 mg | ORAL_TABLET | Freq: Four times a day (QID) | ORAL | Status: DC | PRN

## 2021-10-09 MED ORDER — METOPROLOL TARTRATE 50 MG PO TABS
50.0000 mg | ORAL_TABLET | Freq: Two times a day (BID) | ORAL | Status: DC
  Administered 2021-10-09 – 2021-10-16 (×12): 50 mg via ORAL
  Filled 2021-10-09 (×14): qty 1

## 2021-10-09 MED ORDER — MAGNESIUM SULFATE IN D5W 1-5 GM/100ML-% IV SOLN
1.0000 g | Freq: Once | INTRAVENOUS | Status: AC
Start: 2021-10-09 — End: 2021-10-09
  Administered 2021-10-09: 1 g via INTRAVENOUS
  Filled 2021-10-09: qty 100

## 2021-10-09 MED ORDER — POLYETHYLENE GLYCOL 3350 17 G PO PACK: 34.0000 g | PACK | Freq: Once | ORAL | Status: DC

## 2021-10-09 MED ORDER — POTASSIUM CHLORIDE 10 MEQ/100ML IV SOLN
10.0000 meq | INTRAVENOUS | Status: AC
  Administered 2021-10-09: 10 meq via INTRAVENOUS
  Filled 2021-10-09 (×2): qty 100

## 2021-10-09 MED ORDER — POTASSIUM CHLORIDE 10 MEQ/100ML IV SOLN
10.0000 meq | INTRAVENOUS | Status: DC
  Administered 2021-10-09: 10 meq via INTRAVENOUS
  Filled 2021-10-09: qty 100

## 2021-10-09 NOTE — ED Provider Notes (Signed)
[facility] EMERGENCY DEPARTMENT Provider Note   CSN: [id number] Arrival date & time: 10/09/21  1131     History  Chief Complaint  Patient presents with   Abdominal Pain    [name] is a 71 y.o. female presenting for evaluation nausea, vomit, abdominal pain.  Patient states that the past week she has had issues with nausea, vomiting, abdominal pain.  She has also not had a bowel movement about a week.  She has not taken anything for her symptoms.  She just mentioned her symptoms to her son last night, which is what prompted her ER visit today.  She states symptoms were a lot worse last night.  She has been feeling hot and cold, but no known fevers.  She does report cough, though she does have a baseline cough.  No shortness of breath.  No urinary symptoms.  No previous history of GI problems, no previous history of small bowel obstruction or abdominal surgeries  HPI     Home Medications Prior to Admission medications   Medication Sig Start Date End Date Taking? Authorizing Provider  albuterol (VENTOLIN HFA) 108 (90 Base) MCG/ACT inhaler Inhale into the lungs.   Yes [provider]  amLODipine (NORVASC) 10 MG tablet Take 10 mg by mouth daily. 08/10/21  Yes [provider]  aspirin EC 81 MG tablet Take 81 mg by mouth daily.     Yes [provider]  cloNIDine (CATAPRES) 0.3 MG tablet Take 0.3 mg by mouth 2 (two) times daily. 08/12/21  Yes [provider]  dextromethorphan-guaiFENesin (MUCINEX DM) 30-600 MG per 12 hr tablet Take 1 tablet by mouth 2 (two) times daily.   Yes [provider]  diphenhydrAMINE (BENADRYL) 25 MG tablet Take 1 tablet (25 mg total) by mouth every 8 (eight) hours as needed (Tinnitus). 01/08/18  Yes Davonna Belling, MD  docusate sodium (COLACE) 100 MG capsule Take 100 mg by mouth daily.     Yes [provider]  LINZESS 72 MCG capsule Take 72 mcg by mouth daily. 09/16/21  Yes [provider]   metoprolol tartrate (LOPRESSOR) 50 MG tablet Take 50 mg by mouth 2 (two) times daily. 08/10/21  Yes [provider]  omeprazole (PRILOSEC) 40 MG capsule  10/20/19  Yes [provider]  polyethylene glycol powder (GLYCOLAX/MIRALAX) powder Take 17 g by mouth daily.   Yes [provider]  rosuvastatin (CRESTOR) 5 MG tablet Take by mouth.   Yes [provider]  HYDROcodone-acetaminophen (NORCO/VICODIN) 5-325 MG tablet One tablet every four hours for pain. Patient not taking: Reported on 10/09/2021 01/23/20   Sanjuana Kava, MD  lubiprostone (AMITIZA) 24 MCG capsule Take 1 capsule (24 mcg total) by mouth 2 (two) times daily with a meal. Patient not taking: Reported on 10/09/2021 05/30/15   Danie Binder, MD  lubiprostone (AMITIZA) 8 MCG capsule Take 1 capsule (8 mcg total) by mouth 2 (two) times daily with a meal. Patient not taking: Reported on 08/21/2014 08/02/14   Annitta Needs, NP  simvastatin (ZOCOR) 40 MG tablet Take 1 tablet (40 mg total) by mouth daily. Patient not taking: Reported on 10/09/2021 10/26/12   Alycia Rossetti, MD      Allergies    Penicillins, Aspirin, Bee venom, Contrast media [iodinated contrast media], Dilaudid [hydromorphone hcl], Lisinopril, and Motrin [ibuprofen]    Review of Systems   Review of Systems  Constitutional:  Positive for fever (subjective).  Gastrointestinal:  Positive for abdominal pain, constipation, nausea  and vomiting.  All other systems reviewed and are negative.  Physical Exam Updated Vital Signs BP (!) 157/74 (BP Location: Left Arm)    Pulse (!) 114    Temp 99.2 F (37.3 C) (Oral)    Resp 17    Ht 5' (1.524 m)    Wt 47.6 kg    SpO2 93%    BMI 20.51 kg/m  Physical Exam Vitals and nursing note reviewed.  Constitutional:      General: She is not in acute distress.    Appearance: Normal appearance.     Comments: nontoxic  HENT:     Head: Normocephalic and atraumatic.  Eyes:     Conjunctiva/sclera: Conjunctivae  normal.     Pupils: Pupils are equal, round, and reactive to light.  Cardiovascular:     Rate and Rhythm: Regular rhythm. Tachycardia present.     Pulses: Normal pulses.     Comments: Mildly tachycardic in the low 100s Pulmonary:     Effort: Pulmonary effort is normal. No respiratory distress.     Breath sounds: Normal breath sounds. No wheezing.     Comments: Speaking in full sentences.  Clear lung sounds in all fields. Abdominal:     General: There is no distension.     Palpations: Abdomen is soft. There is no mass.     Tenderness: There is abdominal tenderness. There is no guarding or rebound.     Comments: Diffuse tenderness palpation of the abdomen.  No rigidity, guarding, distention.  Negative rebound.  No focal tenderness  Musculoskeletal:        General: Normal range of motion.     Cervical back: Normal range of motion and neck supple.  Skin:    General: Skin is warm and dry.     Capillary Refill: Capillary refill takes less than 2 seconds.  Neurological:     Mental Status: She is alert and oriented to person, place, and time.  Psychiatric:        Mood and Affect: Mood and affect normal.        Speech: Speech normal.        Behavior: Behavior normal.    ED Results / Procedures / Treatments   Labs (all labs ordered are listed, but only abnormal results are displayed) Labs Reviewed  COMPREHENSIVE METABOLIC PANEL - Abnormal; Notable for the following components:      Result Value   Potassium 2.7 (*)    Glucose, Bld 172 (*)    Creatinine, Ser 1.05 (*)    Total Protein 8.4 (*)    GFR, Estimated 56 (*)    All other components within normal limits  CBC - Abnormal; Notable for the following components:   WBC 13.6 (*)    MCV 108.0 (*)    MCH 35.0 (*)    All other components within normal limits  URINALYSIS, ROUTINE W REFLEX MICROSCOPIC - Abnormal; Notable for the following components:   APPearance HAZY (*)    Hgb urine dipstick MODERATE (*)    Ketones, ur 20 (*)     Protein, ur >=300 (*)    All other components within normal limits  RESP PANEL BY RT-PCR (FLU A&B, COVID) [id number]  LIPASE, BLOOD    EKG EKG Interpretation  Date/Time:  Thursday October 09 2021 12:38:28 EST Ventricular Rate:  83 PR Interval:  91 QRS Duration: 102 QT Interval:  303 QTC Calculation: 356 R Axis:   65 Text Interpretation: Sinus rhythm Atrial premature complex Short PR interval  Borderline low voltage, extremity leads Repol abnrm suggests ischemia, diffuse leads , changes lateral leads new since last tracing Minimal ST elevation, lateral leads Artifact Confirmed by Dorie Rank [phone]) on 10/09/2021 12:46:04 PM  Radiology CT ABDOMEN PELVIS WO CONTRAST  Result Date: 10/09/2021 CLINICAL DATA:  Nausea, vomiting and abdominal pain. EXAM: CT ABDOMEN AND PELVIS WITHOUT CONTRAST TECHNIQUE: Multidetector CT imaging of the abdomen and pelvis was performed following the standard protocol without IV contrast. RADIATION DOSE REDUCTION: This exam was performed according to the departmental dose-optimization program which includes automated exposure control, adjustment of the mA and/or kV according to patient size and/or use of iterative reconstruction technique. COMPARISON:  07/16/2014 FINDINGS: Lower chest: No acute abnormality. Hepatobiliary: Multiple layering gallstones in the gallbladder lumen. The gallbladder is mildly distended and without obvious inflammation. Extrahepatic bile ducts are dilated with the common bile duct reaching maximum diameter of approximately 9-10 mm distally. In the distal common bile duct near the ampulla, focal calcific density measures up to approximately 6 mm in length on the coronal images and is consistent with at least one calculus in the distal common bile duct. The liver appears unremarkable. Pancreas: Unremarkable. No pancreatic ductal dilatation or surrounding inflammatory changes. Spleen: Normal in size without focal abnormality. Adrenals/Urinary Tract: Adrenal  glands are unremarkable. Kidneys are normal, without renal calculi, focal lesion, or hydronephrosis. Bladder is unremarkable. Stomach/Bowel: Bowel shows moderate fecal material throughout the colon without evidence of focal fecal impaction. No small bowel dilatation. No signs of free intraperitoneal air. Vascular/Lymphatic: Atherosclerosis of the abdominal aorta and iliac arteries without evidence of aneurysm. No enlarged lymph nodes identified in the abdomen or pelvis. Reproductive: Status post hysterectomy. No adnexal masses. Other: No abdominal wall hernia or abnormality. No abdominopelvic ascites. Musculoskeletal: No acute or significant osseous findings. IMPRESSION: 1. Cholelithiasis and gallbladder distension without obvious gallbladder inflammation. Evidence of choledocholithiasis with at least one calculus in the distal common bile duct and associated dilatation of the common bile duct up to a diameter of approximately 9-10 mm. 2. Moderate fecal material throughout the colon without focal fecal impaction or evidence of overt bowel obstruction. Electronically Signed   By: Aletta Edouard M.D.   On: 10/09/2021 15:05   DG Chest 2 View  Result Date: 10/09/2021 CLINICAL DATA:  Cough and chest pain. EXAM: CHEST - 2 VIEW COMPARISON:  Chest x-ray 04/19/2007. FINDINGS: There are emphysematous changes in the right upper lobe, unchanged. The lungs are otherwise clear. No pleural effusion or pneumothorax. No acute fractures. IMPRESSION: 1. No acute cardiopulmonary process. 2.  Emphysema (ICD10-J43.9). Electronically Signed   By: Ronney Asters M.D.   On: 10/09/2021 15:24    Procedures Procedures    Medications Ordered in ED Medications  potassium chloride 10 mEq in 100 mL IVPB (10 mEq Intravenous New Bag/Given 10/09/21 1539)  ciprofloxacin (CIPRO) IVPB 400 mg (has no administration in time range)  metroNIDAZOLE (FLAGYL) IVPB 500 mg (has no administration in time range)  potassium chloride 10 mEq in 100 mL  IVPB (has no administration in time range)  sodium chloride 0.9 % bolus 500 mL (0 mLs Intravenous Stopped 10/09/21 1545)  potassium chloride SA (KLOR-CON M) CR tablet 40 mEq (40 mEq Oral Given 10/09/21 1530)  ondansetron (ZOFRAN) injection 4 mg (4 mg Intravenous Given 10/09/21 1533)  fentaNYL (SUBLIMAZE) injection 25 mcg (25 mcg Intravenous Given 10/09/21 1535)    ED Course/ Medical Decision Making/ A&P  Medical Decision Making Amount and/or Complexity of Data Reviewed Labs: ordered. Radiology: ordered.  Risk Prescription drug management. Decision regarding hospitalization.   This patient presents to the ED for concern of nausea, vomiting, abd pain, constipation. This involves an extensive number of treatment options, and is a complaint that carries with it a moderate risk of complications and morbidity.  The differential diagnosis includes Small bowel obstruction, viral GI illness, pancreatitis, appendicitis, gallbladder etiology, shin, pneumonia, COVID   Co morbidities: Previous CVA, history of colitis, GERD   Lab Tests:  I ordered, and personally interpreted labs.  The pertinent results include: Mild leukocytosis of 13.6, may be reactive.  Potassium low at 2.7.  Will replenish with p.o. and IV.  Creatinine is minimally elevated at 1.05, no recent to compare.  Urine consistent with dehydration, but not infection.   Imaging Studies:  I ordered imaging studies including chest x-ray and abdominal CT.  Unfortunately due to patient contrast allergy, CT was done without contrast I independently visualized and interpreted imaging which showed no pneumonia conotoxin effusion.  CT abdomen pelvis consistent with gallstones with gallbladder dilation and choledocholithiasis.  No small bowel obstruction I agree with the radiologist interpretation   Cardiac Monitoring:  The patient was maintained on a cardiac monitor.  I personally viewed and interpreted the cardiac  monitored which showed an underlying rhythm of: Sinus tach   Medicines ordered:  I ordered medication including potassium, Zofran, fentanyl, fluids for hypokalemia, pain control, nausea control, dehydration Reevaluation of the patient after these medicines showed that the patient improved   Consults:  I requested consultation with the on-call GI provider, Dr. Work.  Discussed patient findings and he recommends patient be admitted to medicine.  Dr. Laural Golden is on-call tomorrow and is able to perform the ERCP at this facility, patient does not need to be transferred to [facility].  N.p.o. after midnight,    Disposition:  After consideration of the diagnostic results and the patients response to treatment, I feel that the patent would benefit from inpatient management for choledocholithiasis and continued potassium supplementation.  Discussed findings and plan with patient and son, they are agreeable.   Discussed with Dr. Waldron Labs from [facility] hospitalist service, patient to be admitted  Final Clinical Impression(s) / ED Diagnoses Final diagnoses:  Choledocholithiasis  Hypokalemia  AKI (acute kidney injury) [facility])    Rx / [place] Orders ED Discharge Orders     None         Franchot Heidelberg, PA-C 10/09/21 1656    Dorie Rank, MD 10/10/21 [phone]

## 2021-10-09 NOTE — ED Notes (Signed)
Patient transported to CT 

## 2021-10-09 NOTE — ED Notes (Signed)
Critical Value Potassium 2.7 

## 2021-10-09 NOTE — ED Notes (Signed)
Patients oxygen saturation dropped to 84% on RA.  Placed the patient on 2 L via [place].  Provider notified.  Currently O2 saturation 96%.

## 2021-10-09 NOTE — H&P (Signed)
TRH H&P   Patient Demographics:    [name], is a 71 y.o. female  MRN: [id number]   DOB - December 10, 1950  Admit Date - 10/09/2021  Outpatient Primary MD for the patient is [name], [name], MD  Referring MD/NP/PA: PA Sophia  Patient coming from: home  Chief Complaint  Patient presents with   Abdominal Pain      HPI:    [name]  is a 71 y.o. female, with past medical history of CVA, CAD, hypertension, hyperlipidemia, functional constipation, patient presents to ED secondary to complaints of abdominal pain, nausea and vomiting, symptoms been going on for last week, she has chronic constipation as well, she mentioned symptoms to the son yesterday, who prompted her to ED, ports symptoms are worse at nighttime, he does report some cough, she denies dyspnea, fever, chills, no history of previous GI symptoms, no coffee-ground emesis, no bright red blood per rectum. -In ED CT abdomen pelvis significant for choledocholithiasis, LFTs are stable, low-grade temperature 99.2, white blood cell count of 13.6, potassium is low at 2.7, creatinine is up at 1.05, ED discussed with gastroenterology, plan for ERCP tomorrow by Dr. Laural Golden, [facility]ist consulted to admit.    Review of systems:    In addition to the HPI above,  No Fever-chills, No Headache, No changes with Vision or hearing, No problems swallowing food or Liquids, No Chest pain, Cough or Shortness of Breath, Reports nausea, vomiting and abdominal pain No Blood in stool or Urine, No dysuria, No new skin rashes or bruises, No new joints pains-aches,  No new weakness, tingling, numbness in any extremity, No recent weight gain or loss, No polyuria, polydypsia or polyphagia, No significant Mental Stressors.  A full 10 point Review of Systems was done, except as stated above, all other Review of Systems were  negative.   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 Colonic diverticulosis. Inadequate prepartation precluded examination of all the rectal and colonic neoplasm. I suspect slow colonic transit in the setting of a markely redundant colon.   CORONARY ANGIOPLASTY WITH STENT PLACEMENT  [id number]   cyst removed from left wrist     ESOPHAGOGASTRODUODENOSCOPY  09/05/2004   Dr Chucky May gastritis, candida esophagitis   ESOPHAGOGASTRODUODENOSCOPY  09/07/2011   [id number],  esophageal in the distal esophagus/Mild gastritis   Lumps removed from each breast        Social History:     Social History   Tobacco Use   Smoking status: Former    Packs/day: 0.25    Years: 40.00    Pack years: 10.00    Types: Cigarettes    Quit date: 08/02/2017    Years since quitting: 4.1   Smokeless tobacco: Never  Substance Use Topics   Alcohol use: No    Alcohol/week: 0.0 standard drinks     Lives -alone at home  Mobility -independent    Family History :     Family History  Problem Relation Age of Onset   Multiple sclerosis Father    Alzheimer's disease Mother    Hypertension Mother    Diabetes Mother    Colon cancer Neg Hx       Home Medications:   Prior to Admission medications   Medication Sig Start Date End Date Taking? Authorizing Provider  albuterol (VENTOLIN HFA) 108 (90 Base) MCG/ACT inhaler Inhale into the lungs.   Yes [provider]  amLODipine (NORVASC) 10 MG  tablet Take 10 mg by mouth daily. 08/10/21  Yes [provider]  aspirin EC 81 MG tablet Take 81 mg by mouth daily.     Yes [provider]  cloNIDine (CATAPRES) 0.3 MG tablet Take 0.3 mg by mouth 2 (two) times daily. 08/12/21  Yes [provider]  dextromethorphan-guaiFENesin (MUCINEX DM) 30-600 MG per 12 hr tablet Take 1 tablet by mouth 2 (two) times daily.   Yes [provider]  diphenhydrAMINE (BENADRYL) 25 MG tablet Take 1 tablet (25 mg total) by mouth every 8 (eight) hours as needed (Tinnitus). 01/08/18  Yes Davonna Belling, MD  docusate sodium (COLACE) 100 MG capsule Take 100 mg by mouth daily.     Yes [provider]  LINZESS 72 MCG capsule Take 72 mcg by mouth daily. 09/16/21  Yes [provider]  metoprolol tartrate (LOPRESSOR) 50 MG tablet Take 50 mg by mouth 2 (two) times daily. 08/10/21  Yes [provider]  omeprazole (PRILOSEC) 40 MG capsule  10/20/19  Yes [provider]  polyethylene glycol powder (GLYCOLAX/MIRALAX) powder Take 17 g by mouth daily.   Yes [provider]  rosuvastatin (CRESTOR) 5 MG tablet Take by mouth.   Yes [provider]  HYDROcodone-acetaminophen (NORCO/VICODIN) 5-325 MG tablet One tablet every four hours for pain. Patient not taking: Reported on 10/09/2021 01/23/20   Sanjuana Kava, MD  lubiprostone (AMITIZA) 24 MCG capsule Take 1 capsule (24 mcg total) by mouth 2 (two) times daily with a meal. Patient not taking: Reported on 10/09/2021 05/30/15   Danie Binder, MD  lubiprostone (AMITIZA) 8 MCG capsule Take 1 capsule (8 mcg total) by mouth 2 (two) times daily with a meal. Patient not taking: Reported on 08/21/2014 08/02/14   Annitta Needs, NP  simvastatin (ZOCOR) 40 MG tablet Take 1 tablet (40 mg total) by mouth daily. Patient not taking: Reported on 10/09/2021 10/26/12   Alycia Rossetti, MD     Allergies:     Allergies  Allergen Reactions   Penicillins Anaphylaxis    Aspirin Swelling and Other (See Comments)    Only in large doses will cause a reaction   Bee Venom Swelling    Severe swelling   Contrast Media [Iodinated Contrast Media] Swelling   Dilaudid [Hydromorphone Hcl] Itching   Lisinopril Swelling   Motrin [  Ibuprofen] Swelling     Physical Exam:   Vitals  Blood pressure (!) 157/74, pulse (!) 114, temperature 99.2 F (37.3 C), temperature source Oral, resp. rate 17, height 5' (1.524 m), weight 47.6 kg, SpO2 93 %.   1. General frail, thin appearing female, laying in bed in mild discomfort  2. Normal affect and insight, Not Suicidal or Homicidal, Awake Alert, Oriented X 3.  3. No F.N deficits, ALL C.Nerves Intact, Strength 5/5 all 4 extremities, Sensation intact all 4 extremities, Plantars down going.  4. Ears and Eyes appear Normal, Conjunctivae clear, PERRLA. Moist Oral Mucosa.  5. Supple Neck, No JVD, No cervical lymphadenopathy appriciated, No Carotid Bruits.  6. Symmetrical Chest wall movement, Good air movement bilaterally, CTAB.  7.  Tachycardic, No Gallops, Rubs or Murmurs, No Parasternal Heave.  8. Positive Bowel Sounds, Abdomen Soft, right upper quadrant tenderness to palpation, No organomegaly appriciated,No rebound -guarding or rigidity.  9.  No Cyanosis, Normal Skin Turgor, No Skin Rash or Bruise.  10. Good muscle tone,  joints appear normal , no effusions, Normal ROM.  11. No Palpable Lymph Nodes in Neck or Axillae     Data Review:    CBC Recent Labs  Lab 10/09/21 1307  WBC 13.6*  HGB 14.4  HCT 44.4  PLT 233  MCV 108.0*  MCH 35.0*  MCHC 32.4  RDW 12.5   ------------------------------------------------------------------------------------------------------------------  Chemistries  Recent Labs  Lab 10/09/21 1307  NA 143  K 2.7*  CL 106  CO2 23  GLUCOSE 172*  BUN 8  CREATININE 1.05*  CALCIUM 9.9  AST 27  ALT 17  ALKPHOS 112  BILITOT 1.0    ------------------------------------------------------------------------------------------------------------------ estimated creatinine clearance is 34.8 mL/min (A) (by C-G formula based on SCr of 1.05 mg/dL (H)). ------------------------------------------------------------------------------------------------------------------ No results for input(s): TSH, T4TOTAL, T3FREE, THYROIDAB in the last 72 hours.  Invalid input(s): FREET3  Coagulation profile No results for input(s): INR, PROTIME in the last 168 hours. ------------------------------------------------------------------------------------------------------------------- No results for input(s): DDIMER in the last 72 hours. -------------------------------------------------------------------------------------------------------------------  Cardiac Enzymes No results for input(s): CKMB, TROPONINI, MYOGLOBIN in the last 168 hours.  Invalid input(s): CK ------------------------------------------------------------------------------------------------------------------ No results found for: BNP   ---------------------------------------------------------------------------------------------------------------  Urinalysis    Component Value Date/Time   COLORURINE YELLOW 10/09/2021 1415   APPEARANCEUR HAZY (A) 10/09/2021 1415   LABSPEC 1.018 10/09/2021 1415   PHURINE 6.0 10/09/2021 1415   GLUCOSEU NEGATIVE 10/09/2021 1415   HGBUR MODERATE (A) 10/09/2021 1415   BILIRUBINUR NEGATIVE 10/09/2021 1415   KETONESUR 20 (A) 10/09/2021 1415   PROTEINUR >=300 (A) 10/09/2021 1415   UROBILINOGEN 1.0 07/16/2014 0244   NITRITE NEGATIVE 10/09/2021 1415   LEUKOCYTESUR NEGATIVE 10/09/2021 1415    ----------------------------------------------------------------------------------------------------------------   Imaging Results:    CT ABDOMEN PELVIS WO CONTRAST  Result Date: 10/09/2021 CLINICAL DATA:  Nausea, vomiting and abdominal pain. EXAM: CT  ABDOMEN AND PELVIS WITHOUT CONTRAST TECHNIQUE: Multidetector CT imaging of the abdomen and pelvis was performed following the standard protocol without IV contrast. RADIATION DOSE REDUCTION: This exam was performed according to the departmental dose-optimization program which includes automated exposure control, adjustment of the mA and/or kV according to patient size and/or use of iterative reconstruction technique. COMPARISON:  07/16/2014 FINDINGS: Lower chest: No acute abnormality. Hepatobiliary: Multiple layering gallstones in the gallbladder lumen. The gallbladder is mildly distended and without obvious inflammation. Extrahepatic bile ducts are dilated with the common bile duct reaching maximum diameter of approximately 9-10 mm distally. In the distal common bile duct near  the ampulla, focal calcific density measures up to approximately 6 mm in length on the coronal images and is consistent with at least one calculus in the distal common bile duct. The liver appears unremarkable. Pancreas: Unremarkable. No pancreatic ductal dilatation or surrounding inflammatory changes. Spleen: Normal in size without focal abnormality. Adrenals/Urinary Tract: Adrenal glands are unremarkable. Kidneys are normal, without renal calculi, focal lesion, or hydronephrosis. Bladder is unremarkable. Stomach/Bowel: Bowel shows moderate fecal material throughout the colon without evidence of focal fecal impaction. No small bowel dilatation. No signs of free intraperitoneal air. Vascular/Lymphatic: Atherosclerosis of the abdominal aorta and iliac arteries without evidence of aneurysm. No enlarged lymph nodes identified in the abdomen or pelvis. Reproductive: Status post hysterectomy. No adnexal masses. Other: No abdominal wall hernia or abnormality. No abdominopelvic ascites. Musculoskeletal: No acute or significant osseous findings. IMPRESSION: 1. Cholelithiasis and gallbladder distension without obvious gallbladder inflammation.  Evidence of choledocholithiasis with at least one calculus in the distal common bile duct and associated dilatation of the common bile duct up to a diameter of approximately 9-10 mm. 2. Moderate fecal material throughout the colon without focal fecal impaction or evidence of overt bowel obstruction. Electronically Signed   By: Aletta Edouard M.D.   On: 10/09/2021 15:05   DG Chest 2 View  Result Date: 10/09/2021 CLINICAL DATA:  Cough and chest pain. EXAM: CHEST - 2 VIEW COMPARISON:  Chest x-ray 04/19/2007. FINDINGS: There are emphysematous changes in the right upper lobe, unchanged. The lungs are otherwise clear. No pleural effusion or pneumothorax. No acute fractures. IMPRESSION: 1. No acute cardiopulmonary process. 2.  Emphysema (ICD10-J43.9). Electronically Signed   By: Ronney Asters M.D.   On: 10/09/2021 15:24    My personal review of EKG: Rhythm NSR, Rate  83 /min, QTc 356   Assessment & Plan:    Principal Problem:   Choledocholithiasis Active Problems:   Essential hypertension   CAD (coronary artery disease), native coronary artery   History of stroke   Choledocholithiasis -Patient presents with nausea, vomiting, and abdominal pain, imaging significant for distended common bile duct and gallstones, GI has been consulted by ED, plan for ERCP tomorrow, will keep clear liquid diet tonight, after midnight. -We will keep on as needed Vicodin/oxycodone/Morphine for pain -We will start on IV fluids. -She is having low-grade temperature 99.5, and leukocytosis of 13.9, will start on antibiotics Cipro and Flagyl IV(has penicillin allergy) -We will request general surgery consult as well as likely will need lap cholecystectomy this admission  History of CAD -She denies any chest pain, or shortness of breath, EKG nonacute, will resume on aspirin and statin once stable, meanwhile continue with metoprolol  Hypertension -Blood pressure controlled, most likely due to pain, will resume home regimen  including clonidine, metoprolol and Norvasc  Severe hypokalemia -We will monitor on telemetry, repleting with p.o. and IV supplements, and will give magnesium, and will recheck BMP and mag in AM.  History of CVA -Resume aspirin and statin once stable  Severe constipation -Continue with Amitiza and will start on good bowel regimen including suppositories, MiraLAX.  CKD stage II -No recent labs, continue with IV fluids.   DVT Prophylaxis subcutaneous Heparin>> to be started tomorrow at 10 PM, by then ERCP should be done, if it postponed then we will postpone DVT prophylaxis further.  AM Labs Ordered, also please review Full Orders  Family Communication: Admission, patients condition and plan of care including tests being ordered have been discussed with the patient and son at bedside  who indicate understanding and agree with the plan and Code Status.  Code Status full  Likely DC to  home  Condition GUARDED    Consults called: gi by ED, general surgery consulted    Admission status: inpatient    Time spent in minutes : 65 minutes   Phillips Climes M.D on 10/09/2021 at 5:03 PM   [facility] - Office  [phone]

## 2021-10-09 NOTE — ED Triage Notes (Signed)
Pt c/o chills, abd pain, and vomiting since last night around 11pm.   LBM was " several days to a week ago."

## 2021-10-10 ENCOUNTER — Encounter (HOSPITAL_COMMUNITY)
Admission: EM | Disposition: A | Discharge status: Home / Self Care | Payer: Self-pay | Source: Home / Self Care | Attending: Internal Medicine

## 2021-10-10 ENCOUNTER — Encounter (HOSPITAL_COMMUNITY): Payer: Self-pay | Admitting: Internal Medicine

## 2021-10-10 ENCOUNTER — Inpatient Hospital Stay (HOSPITAL_COMMUNITY): Payer: Medicare Other

## 2021-10-10 ENCOUNTER — Inpatient Hospital Stay (HOSPITAL_COMMUNITY): Payer: Medicare Other | Admitting: Anesthesiology

## 2021-10-10 DIAGNOSIS — K835 Biliary cyst: Secondary | ICD-10-CM

## 2021-10-10 DIAGNOSIS — K8050 Calculus of bile duct without cholangitis or cholecystitis without obstruction: Secondary | ICD-10-CM

## 2021-10-10 HISTORY — PX: ERCP: SHX5425

## 2021-10-10 HISTORY — PX: SPHINCTEROTOMY: SHX5279

## 2021-10-10 LAB — CBC
HCT: 39 % (ref 36.0–46.0)
Hemoglobin: 12.3 g/dL (ref 12.0–15.0)
MCH: 34.1 pg — ABNORMAL HIGH (ref 26.0–34.0)
MCHC: 31.5 g/dL (ref 30.0–36.0)
MCV: 108 fL — ABNORMAL HIGH (ref 80.0–100.0)
Platelets: 231 10*3/uL (ref 150–400)
RBC: 3.61 MIL/uL — ABNORMAL LOW (ref 3.87–5.11)
RDW: 12.6 % (ref 11.5–15.5)
WBC: 12.3 10*3/uL — ABNORMAL HIGH (ref 4.0–10.5)
nRBC: 0 % (ref 0.0–0.2)

## 2021-10-10 LAB — PROTIME-INR
INR: 1 (ref 0.8–1.2)
Prothrombin Time: 13.6 seconds (ref 11.4–15.2)

## 2021-10-10 LAB — COMPREHENSIVE METABOLIC PANEL
ALT: 14 U/L (ref 0–44)
AST: 24 U/L (ref 15–41)
Albumin: 3.3 g/dL — ABNORMAL LOW (ref 3.5–5.0)
Alkaline Phosphatase: 86 U/L (ref 38–126)
Anion gap: 7 (ref 5–15)
BUN: 8 mg/dL (ref 8–23)
CO2: 23 mmol/L (ref 22–32)
Calcium: 9.2 mg/dL (ref 8.9–10.3)
Chloride: 111 mmol/L (ref 98–111)
Creatinine, Ser: 1.08 mg/dL — ABNORMAL HIGH (ref 0.44–1.00)
GFR, Estimated: 55 mL/min — ABNORMAL LOW (ref >60)
Glucose, Bld: 107 mg/dL — ABNORMAL HIGH (ref 70–99)
Potassium: 5 mmol/L (ref 3.5–5.1)
Sodium: 141 mmol/L (ref 135–145)
Total Bilirubin: 1.1 mg/dL (ref 0.3–1.2)
Total Protein: 6.7 g/dL (ref 6.5–8.1)

## 2021-10-10 LAB — VITAMIN B12: Vitamin B-12: 167 pg/mL — ABNORMAL LOW (ref 180–914)

## 2021-10-10 LAB — FOLATE: Folate: 4.2 ng/mL — ABNORMAL LOW (ref >5.9)

## 2021-10-10 SURGERY — ERCP, WITH INTERVENTION IF INDICATED
Anesthesia: General

## 2021-10-10 MED ORDER — SUCCINYLCHOLINE CHLORIDE 200 MG/10ML IV SOSY
PREFILLED_SYRINGE | INTRAVENOUS | Status: AC
  Filled 2021-10-10: qty 10

## 2021-10-10 MED ORDER — ONDANSETRON HCL 4 MG/2ML IJ SOLN
INTRAMUSCULAR | Status: AC
  Filled 2021-10-10: qty 2

## 2021-10-10 MED ORDER — ROCURONIUM BROMIDE 10 MG/ML (PF) SYRINGE
PREFILLED_SYRINGE | INTRAVENOUS | Status: AC
  Filled 2021-10-10: qty 20

## 2021-10-10 MED ORDER — STERILE WATER FOR IRRIGATION IR SOLN
Status: DC | PRN
  Administered 2021-10-10: 1000 mL

## 2021-10-10 MED ORDER — GLUCAGON HCL RDNA (DIAGNOSTIC) 1 MG IJ SOLR
INTRAMUSCULAR | Status: AC
  Filled 2021-10-10: qty 2

## 2021-10-10 MED ORDER — GLUCAGON HCL RDNA (DIAGNOSTIC) 1 MG IJ SOLR
INTRAMUSCULAR | Status: AC
  Filled 2021-10-10: qty 1

## 2021-10-10 MED ORDER — SUGAMMADEX SODIUM 200 MG/2ML IV SOLN
INTRAVENOUS | Status: DC | PRN
  Administered 2021-10-10: 200 mg via INTRAVENOUS

## 2021-10-10 MED ORDER — ROCURONIUM BROMIDE 100 MG/10ML IV SOLN
INTRAVENOUS | Status: DC | PRN
  Administered 2021-10-10: 40 mg via INTRAVENOUS

## 2021-10-10 MED ORDER — DIPHENHYDRAMINE HCL 50 MG/ML IJ SOLN
INTRAMUSCULAR | Status: AC
  Filled 2021-10-10: qty 1

## 2021-10-10 MED ORDER — DEXAMETHASONE SODIUM PHOSPHATE 4 MG/ML IJ SOLN
INTRAMUSCULAR | Status: AC
  Filled 2021-10-10: qty 2

## 2021-10-10 MED ORDER — METHYLPREDNISOLONE SODIUM SUCC 40 MG IJ SOLR
40.0000 mg | Freq: Once | INTRAMUSCULAR | Status: AC
  Administered 2021-10-10: 40 mg via INTRAVENOUS
  Filled 2021-10-10: qty 1

## 2021-10-10 MED ORDER — GLUCAGON HCL RDNA (DIAGNOSTIC) 1 MG IJ SOLR
INTRAMUSCULAR | Status: DC | PRN
  Administered 2021-10-10 (×5): .25 mg via INTRAVENOUS

## 2021-10-10 MED ORDER — SODIUM CHLORIDE 0.9 % IV SOLN: INTRAVENOUS | Status: DC

## 2021-10-10 MED ORDER — LIDOCAINE HCL (CARDIAC) PF 100 MG/5ML IV SOSY
PREFILLED_SYRINGE | INTRAVENOUS | Status: DC | PRN
  Administered 2021-10-10: 80 mg via INTRATRACHEAL

## 2021-10-10 MED ORDER — PHENYLEPHRINE 40 MCG/ML (10ML) SYRINGE FOR IV PUSH (FOR BLOOD PRESSURE SUPPORT)
PREFILLED_SYRINGE | INTRAVENOUS | Status: AC
  Filled 2021-10-10: qty 10

## 2021-10-10 MED ORDER — DIPHENHYDRAMINE HCL 50 MG/ML IJ SOLN
25.0000 mg | Freq: Once | INTRAMUSCULAR | Status: AC
  Administered 2021-10-10: 25 mg via INTRAVENOUS

## 2021-10-10 MED ORDER — LACTATED RINGERS IV SOLN: INTRAVENOUS | Status: DC | PRN

## 2021-10-10 MED ORDER — LIDOCAINE HCL (PF) 2 % IJ SOLN
INTRAMUSCULAR | Status: AC
  Filled 2021-10-10: qty 5

## 2021-10-10 MED ORDER — ONDANSETRON HCL 4 MG/2ML IJ SOLN
INTRAMUSCULAR | Status: DC | PRN
  Administered 2021-10-10: 4 mg via INTRAVENOUS

## 2021-10-10 MED ORDER — DEXAMETHASONE SODIUM PHOSPHATE 10 MG/ML IJ SOLN
INTRAMUSCULAR | Status: AC
  Filled 2021-10-10: qty 1

## 2021-10-10 MED ORDER — KETOROLAC TROMETHAMINE 30 MG/ML IJ SOLN
INTRAMUSCULAR | Status: AC
  Filled 2021-10-10: qty 1

## 2021-10-10 MED ORDER — INDOMETHACIN 50 MG RE SUPP
50.0000 mg | Freq: Once | RECTAL | Status: AC
  Administered 2021-10-10: 50 mg via RECTAL
  Filled 2021-10-10: qty 1

## 2021-10-10 MED ORDER — DEXAMETHASONE SODIUM PHOSPHATE 4 MG/ML IJ SOLN
8.0000 mg | Freq: Once | INTRAMUSCULAR | Status: AC
  Administered 2021-10-10: 8 mg via INTRAVENOUS

## 2021-10-10 MED ORDER — DEXAMETHASONE SODIUM PHOSPHATE 4 MG/ML IJ SOLN
INTRAMUSCULAR | Status: DC | PRN
Start: 2021-10-10 — End: 2021-10-10
  Administered 2021-10-10: 8 mg via INTRAVENOUS

## 2021-10-10 MED ORDER — PROPOFOL 10 MG/ML IV BOLUS
INTRAVENOUS | Status: DC | PRN
  Administered 2021-10-10: 100 mg via INTRAVENOUS

## 2021-10-10 MED ORDER — SODIUM CHLORIDE 0.9 % IV SOLN
INTRAVENOUS | Status: DC | PRN
  Administered 2021-10-10: 6 mL

## 2021-10-10 SURGICAL SUPPLY — 22 items
BALLN RETRIEVAL 12X15 (BALLOONS) IMPLANT
BASKET TRAPEZOID 3X6 (MISCELLANEOUS) IMPLANT
BASKET TRAPEZOID LITHO 2.0X5 (MISCELLANEOUS) IMPLANT
DEVICE INFLATION ENCORE 26 (MISCELLANEOUS) IMPLANT
DEVICE LOCKING W-BIOPSY CAP (MISCELLANEOUS) IMPLANT
GUIDEWIRE HYDRA JAGWIRE .35 (WIRE) IMPLANT
GUIDEWIRE JAG HINI 025X260CM (WIRE) IMPLANT
KIT ENDO PROCEDURE PEN (KITS) ×2 IMPLANT
KIT TURNOVER KIT A (KITS) ×2 IMPLANT
LUBRICANT JELLY 4.5OZ STERILE (MISCELLANEOUS) IMPLANT
PAD ARMBOARD 7.5X6 YLW CONV (MISCELLANEOUS) ×2 IMPLANT
POSITIONER HEAD 8X9X4 ADT (SOFTGOODS) IMPLANT
SCOPE SPY DS DISPOSABLE (MISCELLANEOUS) IMPLANT
SNARE ROTATE MED OVAL 20MM (MISCELLANEOUS) IMPLANT
SNARE SHORT THROW 13M SML OVAL (MISCELLANEOUS) IMPLANT
SPHINCTEROTOME AUTOTOME .25 (MISCELLANEOUS) IMPLANT
SPHINCTEROTOME HYDRATOME 44 (MISCELLANEOUS) IMPLANT
SYSTEM CONTINUOUS INJECTION (MISCELLANEOUS) IMPLANT
TUBING INSUFFLATOR CO2MPACT (TUBING) ×2 IMPLANT
WALLSTENT METAL COVERED 10X60 (STENTS) IMPLANT
WALLSTENT METAL COVERED 10X80 (STENTS) IMPLANT
WATER STERILE IRR 1000ML POUR (IV SOLUTION) ×2 IMPLANT

## 2021-10-10 NOTE — Anesthesia Procedure Notes (Signed)
Procedure Name: Intubation Date/Time: 10/10/2021 1:50 PM Performed by: Minerva Ends, CRNA Pre-anesthesia Checklist: Patient identified, Emergency Drugs available, Suction available and Patient being monitored Patient Re-evaluated:Patient Re-evaluated prior to induction Oxygen Delivery Method: Circle system utilized Preoxygenation: Pre-oxygenation with 100% oxygen Induction Type: IV induction Ventilation: Mask ventilation without difficulty Laryngoscope Size: Mac and 3 Grade View: Grade I Tube type: Oral Tube size: 6.5 mm Number of attempts: 1 Airway Equipment and Method: Stylet and Oral airway Placement Confirmation: ETT inserted through vocal cords under direct vision, positive ETCO2 and breath sounds checked- equal and bilateral Secured at: 21 cm Tube secured with: Tape Dental Injury: Teeth and Oropharynx as per pre-operative assessment

## 2021-10-10 NOTE — Consult Note (Signed)
Patient seen, full note to follow pending ERCP results.  Temporarily scheduled for laparoscopic cholecystectomy for 10/13/21.

## 2021-10-10 NOTE — Progress Notes (Signed)
Patient was medicated for abdominal pain twice with relief. Patient has not vomited or c/o nausea during the night.

## 2021-10-10 NOTE — Op Note (Signed)
[facility] Patient Name: [name] Procedure Date: 10/10/2021 12:54 PM MRN: [id number] Date of Birth: [birth year] Attending MD: Hildred Laser , MD CSN: [id number] Age: 71 Admit Type: Inpatient Procedure:                ERCP Indications:              Bile duct stone(s) Providers:                Hildred Laser, MD, Lambert Mody, Randa Spike, Technician Referring MD:             Barton Dubois, MD Medicines:                General Anesthesia Complications:            No immediate complications. Estimated Blood Loss:     Estimated blood loss was minimal. Procedure:                Pre-Anesthesia Assessment:                           - Prior to the procedure, a History and Physical                            was performed, and patient medications and                            allergies were reviewed. The patient's tolerance of                            previous anesthesia was also reviewed. The risks                            and benefits of the procedure and the sedation                            options and risks were discussed with the patient.                            All questions were answered, and informed consent                            was obtained. Prior Anticoagulants: The patient has                            taken no previous anticoagulant or antiplatelet                            agents except for aspirin. ASA Grade Assessment: II                            - A patient with mild systemic disease. After  reviewing the risks and benefits, the patient was                            deemed in satisfactory condition to undergo the                            procedure.                           After obtaining informed consent, the scope was                            passed under direct vision. Throughout the                            procedure, the patient's blood pressure, pulse, and                             oxygen saturations were monitored continuously. The                            [employer] single use                            duodenoscope was introduced through the mouth, and                            used to inject contrast into and used to inject                            contrast into the bile duct. The ERCP was                            accomplished without difficulty. The patient                            tolerated the procedure well. Scope In: 2:01:04 PM Scope Out: 3:25:23 PM Total Procedure Duration: 1 hour 24 minutes 19 seconds  Findings:      The scout film was normal. The esophagus was successfully intubated       under direct vision. The scope was advanced to a normal major papilla in       the descending duodenum without detailed examination of the pharynx,       larynx and associated structures, and upper GI tract. The upper GI tract       was grossly normal. The major papilla was normal. The bile duct was       deeply cannulated with the Aut[employer]ome sphincterotome and Stonetome over a       wire. Contrast was injected. I personally interpreted the bile duct       images. There was brisk flow of contrast through the ducts. Image       quality was excellent. Contrast extended to the entire biliary tree. The       common hepatic duct contained one stone. The common bile duct and common  hepatic duct were mildly dilated. The common hepatic duct contained one       stone. A 6 mm biliary sphincterotomy was made with a braided Autotome       sphincterotome using ERBE electrocautery. There was self limited oozing       from the sphincterotomy which did not require treatment.      Before sphincterotomy could be completed due to recent scope drop back       in the stomach with loss of cannulation. Bile duct could not be       cannulated to complete the sphincterotomy and remove the stone..       Pancreatic duct was cannulated but not filled  with contrast. Cystic duct       was also cannulated and appeared to have low takeoff. Impression:               - The major papilla appeared normal.                           - The common bile duct and common hepatic duct were                            mildly dilated with single small stone in common                            hepatic duct.                           - A biliary sphincterotomy was performed performed                            but felt to be incomplete.                           -Cannulation was lost as duodenoscope dropped into                            duodenum and bile duct could not be cannulated                            again. Moderate Sedation:      Per Anesthesia Care Recommendation:           - Avoid aspirin and nonsteroidal anti-inflammatory                            medicines for 3 days.                           - Clear liquid diet.                           - Continue present medications.                           - CBC, comprehensive chemistry panel and serum  amylase in a.m.                           - Indomethacin suppository 100 mg per rectum x1                           - Repeat ERCP in 2 to 3 days.                           - If LFTs are normal may hold off ERCP in request                            Dr. Arnoldo Morale to do intraoperative cholangiogram. Procedure Code(s):        --- Professional ---                           [phone], Endoscopic retrograde                            cholangiopancreatography (ERCP); with                            sphincterotomy/papillotomy Diagnosis Code(s):        --- Professional ---                           K80.50, Calculus of bile duct without cholangitis                            or cholecystitis without obstruction                           K83.8, Other specified diseases of biliary tract CPT copyright 2019 [employer]. All rights reserved. The codes documented in this  report are preliminary and upon coder review may  be revised to meet current compliance requirements. Hildred Laser, MD Hildred Laser, MD 10/10/2021 3:57:58 PM This report has been signed electronically. Number of Addenda: 0

## 2021-10-10 NOTE — Evaluation (Signed)
Physical Therapy Evaluation Patient Details Name: [name] MRN: [id number] DOB: [birth year] Today's Date: 10/10/2021  History of Present Illness  [name]  is a 71 y.o. female, with past medical history of CVA, CAD, hypertension, hyperlipidemia, functional constipation, patient presents to ED secondary to complaints of abdominal pain, nausea and vomiting, symptoms been going on for last week, she has chronic constipation as well, she mentioned symptoms to the son yesterday, who prompted her to ED, ports symptoms are worse at nighttime, he does report some cough, she denies dyspnea, fever, chills, no history of previous GI symptoms, no coffee-ground emesis, no bright red blood per rectum.   Clinical Impression  Patient functioning near baseline but does demonstrate slight generalized weakness and balance deficit. Patient does not require assist for bed mobility or transfers but does demonstrate some unsteadiness with ambulation without AD when turning. Patient does not require additional PT services at this time. Patient discharged to care of nursing for ambulation daily as tolerated for length of stay.         Recommendations for follow up therapy are one component of a multi-disciplinary discharge planning process, led by the attending physician.  Recommendations may be updated based on patient status, additional functional criteria and insurance authorization.  Follow Up Recommendations No PT follow up    Assistance Recommended at Discharge Intermittent Supervision/Assistance  Patient can return home with the following  Assistance with cooking/housework;Help with stairs or ramp for entrance;A little help with walking and/or transfers    Equipment Recommendations None recommended by PT  Recommendations for Other Services       Functional Status Assessment Patient has had a recent decline in their functional status and demonstrates the ability to make significant improvements  in function in a reasonable and predictable amount of time.     Precautions / Restrictions Precautions Precautions: Fall Restrictions Weight Bearing Restrictions: No      Mobility  Bed Mobility Overal bed mobility: Modified Independent                  Transfers Overall transfer level: Modified independent Equipment used: None                    Ambulation/Gait Ambulation/Gait assistance: Min guard Gait Distance (Feet): 20 Feet Assistive device: None Gait Pattern/deviations: Step-through pattern, Decreased stride length Gait velocity: decreased     General Gait Details: labored, slightly unsteady, no AD  Stairs            Wheelchair Mobility    Modified Rankin (Stroke Patients Only)       Balance Overall balance assessment: Needs assistance Sitting-balance support: No upper extremity supported, Feet supported Sitting balance-Leahy Scale: Good Sitting balance - Comments: seated EOB   Standing balance support: No upper extremity supported Standing balance-Leahy Scale: Fair Standing balance comment: fair/good without AD                             Pertinent Vitals/Pain Pain Assessment Pain Assessment: No/denies pain    Home Living Family/patient expects to be discharged to:: Private residence Living Arrangements: Alone Available Help at Discharge: Family Type of Home: House Home Access: Stairs to enter Entrance Stairs-Rails: Right;Left;Can reach both Entrance Stairs-Number of Steps: 6   Home Layout: One level Home Equipment: Agricultural consultant (2 wheels);Cane - single point;BSC/3in1;Shower seat Additional Comments: son states she can stay with him if needed upon discharge    Prior Function  Prior Level of Function : Independent/Modified Independent             Mobility Comments: short distance and household ambulation without at ADLs Comments: Independent     Hand Dominance        Extremity/Trunk Assessment    Upper Extremity Assessment Upper Extremity Assessment: Overall WFL for tasks assessed    Lower Extremity Assessment Lower Extremity Assessment: Overall WFL for tasks assessed    Cervical / Trunk Assessment Cervical / Trunk Assessment: Normal  Communication   Communication: No difficulties  Cognition Arousal/Alertness: Awake/alert Behavior During Therapy: WFL for tasks assessed/performed Overall Cognitive Status: Within Functional Limits for tasks assessed                                          General Comments      Exercises     Assessment/Plan    PT Assessment Patient does not need any further PT services  PT Problem List         PT Treatment Interventions      PT Goals (Current goals can be found in the Care Plan section)  Acute Rehab PT Goals Patient Stated Goal: return home PT Goal Formulation: With patient Time For Goal Achievement: 10/10/21 Potential to Achieve Goals: Good    Frequency       Co-evaluation               AM-PAC PT "6 Clicks" Mobility  Outcome Measure Help needed turning from your back to your side while in a flat bed without using bedrails?: None Help needed moving from lying on your back to sitting on the side of a flat bed without using bedrails?: A Little Help needed moving to and from a bed to a chair (including a wheelchair)?: A Little Help needed standing up from a chair using your arms (e.g., wheelchair or bedside chair)?: A Little Help needed to walk in hospital room?: A Little Help needed climbing 3-5 steps with a railing? : A Little 6 Click Score: 19    End of Session   Activity Tolerance: Patient tolerated treatment well Patient left: in bed;with family/visitor present;with call bell/phone within reach Nurse Communication: Mobility status PT Visit Diagnosis: Unsteadiness on feet (R26.81);Other abnormalities of gait and mobility (R26.89)    Time: 1141-1150 PT Time Calculation (min) (ACUTE ONLY): 9  min   Charges:   PT Evaluation $PT Eval Low Complexity: 1 Low          1:06 PM, 10/10/21 Wyman Songster PT, DPT Physical Therapist at [facility]

## 2021-10-10 NOTE — Progress Notes (Signed)
°  Transition of Care [facility]) Screening Note   Patient Details  Name: [name] Date of Birth: May 07, 1951   Transition of Care [facility]) CM/SW Contact:    Villa Herb, LCSWA Phone Number: 10/10/2021, 10:59 AM    Transition of Care Department [facility]) has reviewed patient and no TOC needs have been identified at this time. We will continue to monitor patient advancement through interdisciplinary progression rounds. If new patient transition needs arise, please place a TOC consult.

## 2021-10-10 NOTE — Transfer of Care (Signed)
Immediate Anesthesia Transfer of Care Note  Patient: [name]  Procedure(s) Performed: ENDOSCOPIC RETROGRADE CHOLANGIOPANCREATOGRAPHY (ERCP) SPHINCTEROTOMY INCOMPLETE, STONE NOT REMOVED  Patient Location: PACU  Anesthesia Type:General  Level of Consciousness: drowsy  Airway & Oxygen Therapy: Patient Spontanous Breathing and Patient connected to face mask oxygen  Post-op Assessment: Report given to RN and Post -op Vital signs reviewed and stable  Post vital signs: Reviewed and stable  Last Vitals:  Vitals Value Taken Time  BP    Temp    Pulse    Resp    SpO2      Last Pain:  Vitals:   10/10/21 1315  TempSrc: Oral  PainSc: 0-No pain      Patients Stated Pain Goal: 2 (10/10/21 1315)  Complications: No notable events documented.

## 2021-10-10 NOTE — Progress Notes (Signed)
Brief ERCP note.  Normal limited view of upper GI tract. Normal ampulla Vater. CBD easily cannulated.  Mildly dilated with single stone. Biliary sphincterotomy performed but not completed because scope fell back into the stomach with loss of cannulation. Cystic duct cannulated few times but could not get back in the bile duct. Mild bleeding noted which stopped spontaneously. PD was cannulated with guidewire but not filled with contrast.  Patient tolerated the procedure well.

## 2021-10-10 NOTE — Anesthesia Preprocedure Evaluation (Signed)
Anesthesia Evaluation  Patient identified by MRN, date of birth, ID band Patient awake    Reviewed: Allergy & Precautions, NPO status , Patient's Chart, lab work & pertinent test results  Airway Mallampati: II  TM Distance: >3 FB Neck ROM: Full    Dental  (+) Upper Dentures, Poor Dentition,    Pulmonary neg pulmonary ROS, former smoker,    Pulmonary exam normal        Cardiovascular hypertension, + CAD and + Past MI  Normal cardiovascular exam  Left ventricle: The cavity size was normal. Wall thickness was  normal. Systolic function was normal. The estimated ejection  fraction was in the range of 55% to 60%. Possible akinesis of the  basalinferolateral myocardium. Left ventricular diastolic  function parameters were normal for the patient&'s age.  - Aortic valve: Mildly calcified annulus. Trileaflet.  - Mitral valve: Mildly calcified annulus. There was trivial  regurgitation.  - Atrial septum: No defect or patent foramen ovale was identified.  - Tricuspid valve: There was trivial regurgitation.  - Pulmonary arteries: Systolic pressure could not be accurately  estimated.  - Pericardium, extracardiac: There was no pericardial effusion.    Neuro/Psych PSYCHIATRIC DISORDERS Anxiety Depression CVA    GI/Hepatic Neg liver ROS, GERD  ,Colitis hx   Endo/Other  negative endocrine ROS  Renal/GU negative Renal ROS     Musculoskeletal negative musculoskeletal ROS (+)   Abdominal   Peds  Hematology negative hematology ROS (+)   Anesthesia Other Findings   Reproductive/Obstetrics                             Anesthesia Physical Anesthesia Plan  ASA: 3  Anesthesia Plan: General   Post-op Pain Management:    Induction:   PONV Risk Score and Plan: 2 and Dexamethasone and Ondansetron  Airway Management Planned: Oral ETT  Additional Equipment:   Intra-op Plan:   Post-operative  Plan:   Informed Consent: I have reviewed the patients History and Physical, chart, labs and discussed the procedure including the risks, benefits and alternatives for the proposed anesthesia with the patient or authorized representative who has indicated his/her understanding and acceptance.       Plan Discussed with: CRNA  Anesthesia Plan Comments:         Anesthesia Quick Evaluation

## 2021-10-10 NOTE — Progress Notes (Signed)
Pt was transferred back from PACU vitals obtained, PT denies any pain and was able to ambulate to and from the bathroom with stand by assist.

## 2021-10-10 NOTE — Anesthesia Postprocedure Evaluation (Signed)
Anesthesia Post Note  Patient: [name]  Procedure(s) Performed: ENDOSCOPIC RETROGRADE CHOLANGIOPANCREATOGRAPHY (ERCP) SPHINCTEROTOMY INCOMPLETE, STONE NOT REMOVED  Patient location during evaluation: PACU Anesthesia Type: General Level of consciousness: awake and alert Pain management: pain level controlled Vital Signs Assessment: post-procedure vital signs reviewed and stable Respiratory status: spontaneous breathing, nonlabored ventilation, respiratory function stable and patient connected to nasal cannula oxygen Cardiovascular status: blood pressure returned to baseline and stable Postop Assessment: no apparent nausea or vomiting Anesthetic complications: no   No notable events documented.   Last Vitals:  Vitals:   10/10/21 1540 10/10/21 1545  BP: 138/79 138/75  Pulse:    Resp: 16 20  Temp: 37 C   SpO2: 100% 98%    Last Pain:  Vitals:   10/10/21 1540  TempSrc:   PainSc: Asleep                 Glynis Smiles

## 2021-10-10 NOTE — Consult Note (Signed)
Referring Provider: Dr. Waldron Labs Primary Care Physician:  Carrolyn Meiers, MD Primary Gastroenterologist:  Dr. Abbey Chatters (previously Dr. Oneida Alar)   Date of Admission: 10/09/21 Date of Consultation: 10/10/21  Reason for Consultation:  Choledocholithiasis   HPI:  [name] is a 71 y.o. year old female with past GI history of chronic constipation and last seen in 2016, presenting to the ED yesterday with acute on chronic abdominal pain, chills, nausea, and vomiting. Labs with leukocytosis: WBC count 13.6, hypokalemia with potassium of 2.7, and CT without contrast revealing cholelithiasis, gallbladder distension, and evidence of choledocholithiasis and CBD dilation up to 9-10 mm. Temp on admission 99.2.    Patient is a limited historian but reports abdominal pain for the past few months, intermittent in nature, with associated nausea and vomiting. She has had poor appetite starting over the summer, which she states is "normal" for her during the summer months. However, this continued to the present, which was concerning for her. She is unsure if any fever at home but did note chills. She mentions losing weight and believes this is around 20-30 lbs over the past few months. I do note that she weighed 54 kg (119 lbs) in May 2021. Today 47.6 kg (104). Due to worsening abdominal pain, N/V, and chills, she presented to the ED yesterday.    Last colonoscopy in 2015 by Dr. Gala Romney, which was done during inpatient admission for colitis and concern for colon mass. Prep was inadequate at that time but no obvious mass noted. Last EGD in 2012. She notes chronic constipation, and Linzess 72 mcg is not as effective for her currently.   Denies any anticoagulation. She does take 81 mg aspirin daily.    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 Colonic diverticulosis. Inadequate prepartation precluded examination of all the rectal and colonic.  I suspect slow colonic transit in the setting of a markely redundant colon.   CORONARY ANGIOPLASTY WITH STENT PLACEMENT  [id number]   cyst removed from left wrist     ESOPHAGOGASTRODUODENOSCOPY  09/05/2004   Dr Chucky May gastritis, candida esophagitis   ESOPHAGOGASTRODUODENOSCOPY  09/07/2011   [id number], esophageal in the distal esophagus/Mild gastritis   Lumps removed from each breast      Prior to Admission medications   Medication Sig Start Date End Date Taking? Authorizing Provider  albuterol (VENTOLIN HFA) 108 (90 Base) MCG/ACT inhaler Inhale into the lungs.   Yes [provider]  amLODipine (NORVASC) 10 MG tablet Take 10 mg by mouth daily. 08/10/21  Yes [provider]  aspirin EC 81 MG tablet Take 81 mg by mouth daily.     Yes [provider]  cloNIDine (CATAPRES) 0.3 MG tablet Take 0.3 mg by mouth 2 (two) times daily. 08/12/21  Yes [provider]  dextromethorphan-guaiFENesin (MUCINEX DM) 30-600 MG per 12 hr tablet Take 1 tablet by mouth 2 (two) times daily.   Yes [provider]  diphenhydrAMINE (BENADRYL) 25 MG tablet Take 1 tablet (25 mg total) by mouth every 8 (eight) hours as needed (Tinnitus). 01/08/18  Yes Davonna Belling, MD  docusate sodium (COLACE) 100 MG capsule Take 100 mg by mouth daily.     Yes [provider]  LINZESS 72 MCG capsule Take 72 mcg by mouth daily. 09/16/21  Yes [provider]  metoprolol tartrate (LOPRESSOR) 50 MG tablet Take 50 mg by mouth 2 (two) times daily. 08/10/21  Yes [provider]  omeprazole (PRILOSEC) 40 MG capsule  10/20/19  Yes [provider]  polyethylene glycol powder (GLYCOLAX/MIRALAX) powder Take 17 g by mouth daily.   Yes [provider]  rosuvastatin (CRESTOR) 5 MG tablet Take by mouth.   Yes [provider]  HYDROcodone-acetaminophen (NORCO/VICODIN) 5-325 MG tablet One tablet every four hours for pain. Patient not taking: Reported on 10/09/2021 01/23/20   Sanjuana Kava, MD  lubiprostone (AMITIZA) 24 MCG capsule Take 1 capsule (24 mcg total) by mouth 2 (two) times daily with a meal. Patient not taking: Reported on 10/09/2021 05/30/15   Danie Binder, MD  lubiprostone (AMITIZA) 8 MCG capsule Take 1 capsule (8 mcg total) by mouth 2 (two) times daily with a meal. Patient not taking: Reported on 08/21/2014 08/02/14   Annitta Needs, NP  simvastatin (ZOCOR) 40 MG tablet Take 1 tablet (40 mg total) by mouth daily. Patient not taking: Reported on 10/09/2021 10/26/12   Alycia Rossetti, MD    Current Facility-Administered Medications  Medication Dose Route Frequency Provider Last Rate Last Admin   0.9 %  sodium chloride infusion   Intravenous Continuous Elgergawy, Silver Huguenin, MD 50 mL/hr at 10/09/21 1820 Rate Change at 10/09/21 1820   acetaminophen (TYLENOL) tablet 650 mg  650 mg Oral Q6H PRN Elgergawy, Silver Huguenin, MD       Or   acetaminophen (TYLENOL) suppository 650 mg  650 mg Rectal Q6H PRN Elgergawy, Silver Huguenin, MD       amLODipine (NORVASC) tablet 10 mg  10 mg Oral Daily Elgergawy, Silver Huguenin, MD   10 mg at 10/10/21 0820   bisacodyl (DULCOLAX) suppository 10 mg  10 mg Rectal BID Elgergawy, Silver Huguenin, MD   10 mg at 10/09/21 1903   ciprofloxacin (CIPRO) IVPB 400 mg  400 mg Intravenous Q12H Elgergawy, Silver Huguenin, MD 200 mL/hr at 10/10/21 0439 400 mg at 10/10/21 0439   cloNIDine (CATAPRES) tablet 0.3 mg  0.3 mg Oral BID Elgergawy, Silver Huguenin, MD   0.3 mg at 10/10/21 [id number]   HYDROcodone-acetaminophen (NORCO/VICODIN) 5-325 MG per tablet 1-2 tablet   1-2 tablet Oral Q4H PRN Elgergawy, Silver Huguenin, MD   1 tablet at 10/10/21 0443   linaclotide (LINZESS) capsule 72 mcg  72 mcg Oral Daily Elgergawy, Silver Huguenin, MD       metoprolol tartrate (LOPRESSOR) tablet 50 mg  50 mg Oral BID Elgergawy, Silver Huguenin, MD   50 mg at 10/10/21 0820   metroNIDAZOLE (FLAGYL) IVPB 500 mg  500 mg Intravenous Q8H Elgergawy, Silver Huguenin, MD 100 mL/hr at 10/10/21 0827 500 mg at 10/10/21 0827   morphine 2 MG/ML injection 1 mg  1 mg Intravenous Q2H PRN Elgergawy, Silver Huguenin, MD       pantoprazole (PROTONIX) EC tablet 40 mg  40 mg Oral Daily Elgergawy, Silver Huguenin, MD   40 mg at 10/10/21 0819   polyethylene glycol (MIRALAX / GLYCOLAX) packet 34 g  34 g Oral Daily Elgergawy, Silver Huguenin, MD  34 g at 10/09/21 1902   senna-docusate (Senokot-S) tablet 2 tablet  2 tablet Oral BID Elgergawy, Silver Huguenin, MD   2 tablet at 10/10/21 [id number]    Allergies as of 10/09/2021 - Review Complete 10/09/2021  Allergen Reaction Noted   Penicillins Anaphylaxis 09/06/2011   Aspirin Swelling and Other (See Comments) 09/07/2011   Bee venom Swelling 09/07/2011   Contrast media [iodinated contrast media] Swelling 09/07/2011   Dilaudid [hydromorphone hcl] Itching 09/07/2011   Lisinopril Swelling 09/07/2011   Motrin [ibuprofen] Swelling 09/07/2011    Family History  Problem Relation Age of Onset   Multiple sclerosis Father    Alzheimer's disease Mother    Hypertension Mother    Diabetes Mother    Colon cancer Neg Hx     Social History   Socioeconomic History   Marital status: Widowed    Spouse name: Not on file   Number of children: 3   Years of education: Not on file   Highest education level: Not on file  Occupational History   Occupation: disabled, Advertising account planner  Tobacco Use   Smoking status: Former    Packs/day: 0.25    Years: 40.00    Pack years: 10.00    Types: Cigarettes    Quit date: 08/02/2017    Years since quitting: 4.1   Smokeless tobacco: Never  Vaping Use   Vaping Use: Never used   Substance and Sexual Activity   Alcohol use: No    Alcohol/week: 0.0 standard drinks   Drug use: Yes    Types: Marijuana   Sexual activity: Not Currently    Birth control/protection: Post-menopausal  Other Topics Concern   Not on file  Social History Narrative   Lives alone   Social Determinants of Health   Financial Resource Strain: Not on file  Food Insecurity: Not on file  Transportation Needs: Not on file  Physical Activity: Not on file  Stress: Not on file  Social Connections: Not on file  Intimate Partner Violence: Not on file    Review of Systems: Gen: see HPI CV: Denies chest pain, heart palpitations, syncope, edema  Resp: Denies shortness of breath with rest, cough, wheezing GI: see HPI GU : Denies urinary burning, urinary frequency, urinary incontinence.  MS: Denies joint pain,swelling, cramping Derm: Denies rash, itching, dry skin Psych: Denies depression, anxiety,confusion, or memory loss Heme: Denies bruising, bleeding, and enlarged lymph nodes.  Physical Exam: Vital signs in last 24 hours: Temp:  [97.4 F (36.3 C)-99.2 F (37.3 C)] 98.2 F (36.8 C) (01/20 0440) Pulse Rate:  [65-130] 80 (01/20 0819) Resp:  [16-20] 18 (01/20 0440) BP: (144-171)/(62-111) 144/62 (01/20 0440) SpO2:  [93 %-100 %] 93 % (01/20 0440) Weight:  [47.6 kg] 47.6 kg (01/19 1222) Last BM Date: 10/10/21 General:   Alert,  no distress, thin but not cachectic-appearing Head:  Normocephalic and atraumatic. Eyes:  Sclera clear, no icterus.    Ears:  Normal auditory acuity. Lungs:  Clear throughout to auscultation.   Heart:  S1 S2 present, no murmurs, clicks, rubs,  or gallops. Abdomen:  Soft, TTP RUQ with guarding and nondistended. No masses, hepatosplenomegaly or hernias noted. Normal bowel sounds Rectal:  Deferred  Msk:  Symmetrical without gross deformities. Normal posture. Extremities:  Without clubbing or edema. Neurologic:  Alert and  oriented x4 Psych:  Alert and  cooperative. Normal mood and affect.  Intake/Output from previous day: 01/19 0701 - 01/20 0700 In: 1753.3 [P.O.:120; I.V.:533.3; IV Piggyback:1100] Out: -  Intake/Output this shift:  No intake/output data recorded.  Lab Results: Recent Labs    10/09/21 1307 10/10/21 0413  WBC 13.6* 12.3*  HGB 14.4 12.3  HCT 44.4 39.0  PLT 233 231   BMET Recent Labs    10/09/21 1307 10/09/21 2230 10/10/21 0413  NA 143 139 141  K 2.7* 3.7 5.0  CL 106 106 111  CO2 23 22 23   GLUCOSE 172* 183* 107*  BUN 8 6* 8  CREATININE 1.05* 0.98 1.08*  CALCIUM 9.9 9.0 9.2   LFT Recent Labs    10/09/21 1307 10/10/21 0413  PROT 8.4* 6.7  ALBUMIN 4.3 3.3*  AST 27 24  ALT 17 14  ALKPHOS 112 86  BILITOT 1.0 1.1   PT/INR Recent Labs    10/10/21 0413  LABPROT 13.6  INR 1.0     Studies/Results: CT ABDOMEN PELVIS WO CONTRAST  Result Date: 10/09/2021 CLINICAL DATA:  Nausea, vomiting and abdominal pain. EXAM: CT ABDOMEN AND PELVIS WITHOUT CONTRAST TECHNIQUE: Multidetector CT imaging of the abdomen and pelvis was performed following the standard protocol without IV contrast. RADIATION DOSE REDUCTION: This exam was performed according to the departmental dose-optimization program which includes automated exposure control, adjustment of the mA and/or kV according to patient size and/or use of iterative reconstruction technique. COMPARISON:  07/16/2014 FINDINGS: Lower chest: No acute abnormality. Hepatobiliary: Multiple layering gallstones in the gallbladder lumen. The gallbladder is mildly distended and without obvious inflammation. Extrahepatic bile ducts are dilated with the common bile duct reaching maximum diameter of approximately 9-10 mm distally. In the distal common bile duct near the ampulla, focal calcific density measures up to approximately 6 mm in length on the coronal images and is consistent with at least one calculus in the distal common bile duct. The liver appears unremarkable. Pancreas:  Unremarkable. No pancreatic ductal dilatation or surrounding inflammatory changes. Spleen: Normal in size without focal abnormality. Adrenals/Urinary Tract: Adrenal glands are unremarkable. Kidneys are normal, without renal calculi, focal lesion, or hydronephrosis. Bladder is unremarkable. Stomach/Bowel: Bowel shows moderate fecal material throughout the colon without evidence of focal fecal impaction. No small bowel dilatation. No signs of free intraperitoneal air. Vascular/Lymphatic: Atherosclerosis of the abdominal aorta and iliac arteries without evidence of aneurysm. No enlarged lymph nodes identified in the abdomen or pelvis. Reproductive: Status post hysterectomy. No adnexal masses. Other: No abdominal wall hernia or abnormality. No abdominopelvic ascites. Musculoskeletal: No acute or significant osseous findings. IMPRESSION: 1. Cholelithiasis and gallbladder distension without obvious gallbladder inflammation. Evidence of choledocholithiasis with at least one calculus in the distal common bile duct and associated dilatation of the common bile duct up to a diameter of approximately 9-10 mm. 2. Moderate fecal material throughout the colon without focal fecal impaction or evidence of overt bowel obstruction. Electronically Signed   By: Aletta Edouard M.D.   On: 10/09/2021 15:05   DG Chest 2 View  Result Date: 10/09/2021 CLINICAL DATA:  Cough and chest pain. EXAM: CHEST - 2 VIEW COMPARISON:  Chest x-ray 04/19/2007. FINDINGS: There are emphysematous changes in the right upper lobe, unchanged. The lungs are otherwise clear. No pleural effusion or pneumothorax. No acute fractures. IMPRESSION: 1. No acute cardiopulmonary process. 2.  Emphysema (ICD10-J43.9). Electronically Signed   By: Ronney Asters M.D.   On: 10/09/2021 15:24    Impression: 71 y.o. year old female with past GI history of chronic constipation and last seen in 2016, presenting to the ED with findings of choledocholithiasis, cholelithiasis,  and concern for evolving cholangitis. Symptoms of abdominal pain,  nausea, vomiting, and poor appetite with associated weight loss have been noted for several months. Leukocytosis on admission with WBC count 13.6 and improved to 12.3 today. Notable hypkoalemia with potassium 2.7 but now resolved with supplementation. Currently afebrile.  CT imaging without contrast with cholelithiasis and gallbladder distension, choledocholithiasis, and CBD dilation. Her LFTs are normal. Currently, she is without abdominal pain. Family member is at bedside. Risks and benefits of ERCP were discussed with patient by Dr. Laural Golden. They both stated understanding. Will also request Surgical consultation while inpatient to determine timing of cholecystectomy (inpatient vs outpatient).   Chronic constipation: remains an ongoing issue for patient despite Linzess 72 mcg. This can certainly be titrated up. This can be addressed further as outpatient. Last colonoscopy in 2015 with inadequate prep. She would benefit from outpatient colonoscopy and extended prep. Weight loss likely due to decreased appetite in setting of abdominal pain from biliary etiology, but other sources need to be ruled out if she continues with weight loss.   Plan: ERCP by Dr. Laural Golden today. Risks and benefits discussed with stated understanding. Continue empiric antibiotics NPO Will request surgical consult as well for cholecystectomy Heparin has been on hold since admission but scheduled to be started this evening barring any changes in status Outpatient follow-up to address constipation and arrange colonoscopy   Annitta Needs, PhD, ANP-BC [facility]      LOS: 1 day    10/10/2021, 9:28 AM

## 2021-10-10 NOTE — Progress Notes (Addendum)
PROGRESS NOTE    [name]  [id number] DOB: 03-03-1951 DOA: 10/09/2021 PCP: Carrolyn Meiers, MD    Chief Complaint  Patient presents with   Abdominal Pain    Brief Narrative:  As per H&P written by Dr. Waldron Labs on 10/09/2021  [name]  is a 71 y.o. female, with past medical history of CVA, CAD, hypertension, hyperlipidemia, functional constipation, patient presents to ED secondary to complaints of abdominal pain, nausea and vomiting, symptoms been going on for last week, she has chronic constipation as well, she mentioned symptoms to her son yesterday, who prompted her to ED, reports symptoms are worse at nighttime, he does report some cough, she denies dyspnea, fever, chills, no history of previous GI symptoms, no coffee-ground emesis, no bright red blood per rectum. -In ED CT abdomen pelvis significant for choledocholithiasis, LFTs are stable, low-grade temperature 99.2, white blood cell count of 13.6, potassium is low at 2.7, creatinine is up at 1.05, ED discussed with gastroenterology, plan for ERCP tomorrow by Dr. Laural Golden, [facility] consulted to admit.   Assessment & Plan: 1-cholelithiasis with Choledocholithiasis -Currently afebrile -Normal LFTs. -Images suggesting obstructive stone in the distal bile duct. -Continue IV fluids, supportive care and current antibiotic therapy -Patient remains n.p.o. -GI planning for ERCP later today. -General surgery also consulted as patient will need cholecystectomy. -Etiology 6 and antiemetics.  2-Essential hypertension -Stable overall -Continue current antihypertensive agents.  3-history of CAD (coronary artery disease), native coronary artery -No chest pain, no shortness of breath -Continue telemetry monitoring. -Continue beta-blocker, holding aspirin in the setting of anticipated procedures.  4-History of stroke -No deficits or new complaints -Resume aspirin for secondary prevention with respect to  medications when taking by mouth, no procedures anticipated.  5-GERD/GI prophylaxis -Continue PPI.  6-chronic constipation -Resume bowel movement regimen. -Follow GI recommendations.  7-hypokalemia -In the setting of GI losses and decreased oral intake -Continue to follow electrolytes and replete as needed. -Telemetry monitoring in place.  DVT prophylaxis: SCDs Code Status: Full code Family Communication: Son at bedside. Disposition:   Status is: Inpatient    Consultants:  Gastroenterology service General surgery  Procedures:  See below for x-ray report ERCP: Pending  Antimicrobials:  Cipro and Flagyl   Subjective: Afebrile, no chest pain, no nausea or vomiting currently.  Still with vague abdominal discomfort.  Reports no appetite.  Objective: Vitals:   10/10/21 1540 10/10/21 1545 10/10/21 1600 10/10/21 1652  BP: 138/79 138/75 124/60 (!) 118/48  Pulse:    61  Resp: 16 20 (!) 21 20  Temp: 98.6 F (37 C)  98.6 F (37 C) 98.4 F (36.9 C)  TempSrc:    Oral  SpO2: 100% 98% 97% 100%  Weight:      Height:        Intake/Output Summary (Last 24 hours) at 10/10/2021 1939 Last data filed at 10/10/2021 1541 Gross per 24 hour  Intake 2253.33 ml  Output --  Net 2253.33 ml   Filed Weights   10/09/21 1222  Weight: 47.6 kg    Examination:  General exam: Appears calm and comfortable; reports no nausea or vomiting currently.  Still having mild vague abdominal discomfort. Respiratory system: Clear to auscultation. Respiratory effort normal.  No requiring oxygen supplementation.  No using accessory muscle. Cardiovascular system: S1 & S2 heard, RRR. No JVD, murmurs, rubs, gallops or clicks. No pedal edema. Gastrointestinal system: Abdomen is nondistended, soft and mildly tender to palpation mid abdomen. No guarding, no organomegaly or masses felt. Normal  bowel sounds heard. Central nervous system: Alert and oriented. No focal neurological deficits. Extremities: No  cyanosis or clubbing.  No edema. Skin: No rashes or petechiae. Psychiatry: Judgement and insight appear normal. Mood & affect appropriate.     Data Reviewed: I have personally reviewed following labs and imaging studies  CBC: Recent Labs  Lab 10/09/21 1307 10/10/21 0413  WBC 13.6* 12.3*  HGB 14.4 12.3  HCT 44.4 39.0  MCV 108.0* 108.0*  PLT 233 [id number]    Basic Metabolic Panel: Recent Labs  Lab 10/09/21 1307 10/09/21 2230 10/10/21 0413  NA 143 139 141  K 2.7* 3.7 5.0  CL 106 106 111  CO2 23 22 23   GLUCOSE 172* 183* 107*  BUN 8 6* 8  CREATININE 1.05* 0.98 1.08*  CALCIUM 9.9 9.0 9.2    GFR: Estimated Creatinine Clearance: 33.8 mL/min (A) (by C-G formula based on SCr of 1.08 mg/dL (H)).  Liver Function Tests: Recent Labs  Lab 10/09/21 1307 10/10/21 0413  AST 27 24  ALT 17 14  ALKPHOS 112 86  BILITOT 1.0 1.1  PROT 8.4* 6.7  ALBUMIN 4.3 3.3*    CBG: No results for input(s): GLUCAP in the last 168 hours.   Recent Results (from the past 240 hour(s))  Resp Panel by RT-PCR (Flu A&B, Covid) Nasopharyngeal Swab     Status: None   Collection Time: 10/09/21  3:58 PM   Specimen: Nasopharyngeal Swab; Nasopharyngeal(NP) swabs in vial transport medium  Result Value Ref Range Status   SARS Coronavirus 2 by RT PCR NEGATIVE NEGATIVE Final    Comment: (NOTE) SARS-CoV-2 target nucleic acids are NOT DETECTED.  The SARS-CoV-2 RNA is generally detectable in upper respiratory specimens during the acute phase of infection. The lowest concentration of SARS-CoV-2 viral copies this assay can detect is 138 copies/mL. A negative result does not preclude SARS-Cov-2 infection and should not be used as the sole basis for treatment or other patient management decisions. A negative result may occur with  improper specimen collection/handling, submission of specimen other than nasopharyngeal swab, presence of viral mutation(s) within the areas targeted by this assay, and inadequate  number of viral copies(<138 copies/mL). A negative result must be combined with clinical observations, patient history, and epidemiological information. The expected result is Negative.  Fact Sheet for Patients:  EntrepreneurPulse.com.au  Fact Sheet for Healthcare Providers:  IncredibleEmployment.be  This test is no t yet approved or cleared by the [place] FDA and  has been authorized for detection and/or diagnosis of SARS-CoV-2 by FDA under an Emergency Use Authorization (EUA). This EUA will remain  in effect (meaning this test can be used) for the duration of the COVID-19 declaration under Section 564(b)(1) of the Act, 21 U.S.C.section 360bbb-3(b)(1), unless the authorization is terminated  or revoked sooner.       Influenza A by PCR NEGATIVE NEGATIVE Final   Influenza B by PCR NEGATIVE NEGATIVE Final    Comment: (NOTE) The Xpert Xpress SARS-CoV-2/FLU/RSV plus assay is intended as an aid in the diagnosis of influenza from Nasopharyngeal swab specimens and should not be used as a sole basis for treatment. Nasal washings and aspirates are unacceptable for Xpert Xpress SARS-CoV-2/FLU/RSV testing.  Fact Sheet for Patients: EntrepreneurPulse.com.au  Fact Sheet for Healthcare Providers: IncredibleEmployment.be  This test is not yet approved or cleared by the [place] FDA and has been authorized for detection and/or diagnosis of SARS-CoV-2 by FDA under an Emergency Use Authorization (EUA). This EUA will remain in effect (meaning this  test can be used) for the duration of the COVID-19 declaration under Section 564(b)(1) of the Act, 21 U.S.C. section 360bbb-3(b)(1), unless the authorization is terminated or revoked.  Performed at [facility], [address], [place], [place] [address]      Radiology Studies: CT ABDOMEN PELVIS WO CONTRAST  Result Date: 10/09/2021 CLINICAL DATA:  Nausea,  vomiting and abdominal pain. EXAM: CT ABDOMEN AND PELVIS WITHOUT CONTRAST TECHNIQUE: Multidetector CT imaging of the abdomen and pelvis was performed following the standard protocol without IV contrast. RADIATION DOSE REDUCTION: This exam was performed according to the departmental dose-optimization program which includes automated exposure control, adjustment of the mA and/or kV according to patient size and/or use of iterative reconstruction technique. COMPARISON:  07/16/2014 FINDINGS: Lower chest: No acute abnormality. Hepatobiliary: Multiple layering gallstones in the gallbladder lumen. The gallbladder is mildly distended and without obvious inflammation. Extrahepatic bile ducts are dilated with the common bile duct reaching maximum diameter of approximately 9-10 mm distally. In the distal common bile duct near the ampulla, focal calcific density measures up to approximately 6 mm in length on the coronal images and is consistent with at least one calculus in the distal common bile duct. The liver appears unremarkable. Pancreas: Unremarkable. No pancreatic ductal dilatation or surrounding inflammatory changes. Spleen: Normal in size without focal abnormality. Adrenals/Urinary Tract: Adrenal glands are unremarkable. Kidneys are normal, without renal calculi, focal lesion, or hydronephrosis. Bladder is unremarkable. Stomach/Bowel: Bowel shows moderate fecal material throughout the colon without evidence of focal fecal impaction. No small bowel dilatation. No signs of free intraperitoneal air. Vascular/Lymphatic: Atherosclerosis of the abdominal aorta and iliac arteries without evidence of aneurysm. No enlarged lymph nodes identified in the abdomen or pelvis. Reproductive: Status post hysterectomy. No adnexal masses. Other: No abdominal wall hernia or abnormality. No abdominopelvic ascites. Musculoskeletal: No acute or significant osseous findings. IMPRESSION: 1. Cholelithiasis and gallbladder distension without  obvious gallbladder inflammation. Evidence of choledocholithiasis with at least one calculus in the distal common bile duct and associated dilatation of the common bile duct up to a diameter of approximately 9-10 mm. 2. Moderate fecal material throughout the colon without focal fecal impaction or evidence of overt bowel obstruction. Electronically Signed   By: Aletta Edouard M.D.   On: 10/09/2021 15:05   DG Chest 2 View  Result Date: 10/09/2021 CLINICAL DATA:  Cough and chest pain. EXAM: CHEST - 2 VIEW COMPARISON:  Chest x-ray 04/19/2007. FINDINGS: There are emphysematous changes in the right upper lobe, unchanged. The lungs are otherwise clear. No pleural effusion or pneumothorax. No acute fractures. IMPRESSION: 1. No acute cardiopulmonary process. 2.  Emphysema (ICD10-J43.9). Electronically Signed   By: Ronney Asters M.D.   On: 10/09/2021 15:24   DG ERCP  Result Date: 10/10/2021 CLINICAL DATA:  Choledocholithiasis EXAM: ERCP COMPARISON:  CT AP, 10/09/2021 and 07/15/2014. FLUOROSCOPY TIME:  Fluoroscopy Time:  3 minutes 37 seconds Radiation Exposure Index (if provided by the fluoroscopic device): 25.1 mGy Number of Acquired Spot Images: Multiple fluoroscopic cine loops, each containing 13 17, 17, 16, 10, 7, 7, 3, 3 and 1 images FINDINGS: Multiple, limited oblique planar images of the RIGHT upper quadrant obtained C-arm. Images demonstrating flexible endoscopy, biliary duct cannulation, sphincterotomy, retrograde cholangiogram and balloon sweep. Common bile duct dilation is present, with at least 1 distal biliary filling defect appreciated. See key image. IMPRESSION: Fluoroscopic imaging for ERCP. Choledocholithiasis, with at least 1 distal biliary stone is demonstrated For complete description of intra procedural findings, please see performing service  dictation. Electronically Signed   By: Michaelle Birks M.D.   On: 10/10/2021 19:10    Scheduled Meds:  amLODipine  10 mg Oral Daily   bisacodyl  10 mg  Rectal BID   cloNIDine  0.3 mg Oral BID   linaclotide  72 mcg Oral Daily   metoprolol tartrate  50 mg Oral BID   pantoprazole  40 mg Oral Daily   polyethylene glycol  34 g Oral Daily   senna-docusate  2 tablet Oral BID   Continuous Infusions:  sodium chloride 50 mL/hr at 10/09/21 1820   ciprofloxacin 400 mg (10/10/21 1847)   metronidazole 500 mg (10/10/21 1720)     LOS: 1 day    Barton Dubois, MD [facility]   To contact the attending provider between 7A-7P or the covering provider during after hours 7P-7A, please log into the web site [email or web address] and access using universal [place] password for that web site. If you do not have the password, please call the hospital operator.  10/10/2021, 7:39 PM

## 2021-10-11 DIAGNOSIS — K8001 Calculus of gallbladder with acute cholecystitis with obstruction: Secondary | ICD-10-CM

## 2021-10-11 LAB — COMPREHENSIVE METABOLIC PANEL
ALT: 16 U/L (ref 0–44)
AST: 33 U/L (ref 15–41)
Albumin: 3.5 g/dL (ref 3.5–5.0)
Alkaline Phosphatase: 88 U/L (ref 38–126)
Anion gap: 9 (ref 5–15)
BUN: 12 mg/dL (ref 8–23)
CO2: 23 mmol/L (ref 22–32)
Calcium: 9.1 mg/dL (ref 8.9–10.3)
Chloride: 106 mmol/L (ref 98–111)
Creatinine, Ser: 1.08 mg/dL — ABNORMAL HIGH (ref 0.44–1.00)
GFR, Estimated: 55 mL/min — ABNORMAL LOW (ref >60)
Glucose, Bld: 179 mg/dL — ABNORMAL HIGH (ref 70–99)
Potassium: 3.6 mmol/L (ref 3.5–5.1)
Sodium: 138 mmol/L (ref 135–145)
Total Bilirubin: 1.3 mg/dL — ABNORMAL HIGH (ref 0.3–1.2)
Total Protein: 7.1 g/dL (ref 6.5–8.1)

## 2021-10-11 LAB — CBC
HCT: 39.4 % (ref 36.0–46.0)
Hemoglobin: 12.6 g/dL (ref 12.0–15.0)
MCH: 34.5 pg — ABNORMAL HIGH (ref 26.0–34.0)
MCHC: 32 g/dL (ref 30.0–36.0)
MCV: 107.9 fL — ABNORMAL HIGH (ref 80.0–100.0)
Platelets: 230 10*3/uL (ref 150–400)
RBC: 3.65 MIL/uL — ABNORMAL LOW (ref 3.87–5.11)
RDW: 12.3 % (ref 11.5–15.5)
WBC: 13.2 10*3/uL — ABNORMAL HIGH (ref 4.0–10.5)
nRBC: 0 % (ref 0.0–0.2)

## 2021-10-11 LAB — AMYLASE: Amylase: 117 U/L — ABNORMAL HIGH (ref 28–100)

## 2021-10-11 MED ORDER — CYANOCOBALAMIN 1000 MCG/ML IJ SOLN
1000.0000 ug | Freq: Every day | INTRAMUSCULAR | Status: DC
  Administered 2021-10-11 – 2021-10-16 (×6): 1000 ug via INTRAMUSCULAR
  Filled 2021-10-11 (×6): qty 1

## 2021-10-11 MED ORDER — FOLIC ACID 1 MG PO TABS
1.0000 mg | ORAL_TABLET | Freq: Every day | ORAL | Status: DC
  Administered 2021-10-11 – 2021-10-16 (×5): 1 mg via ORAL
  Filled 2021-10-11 (×6): qty 1

## 2021-10-11 NOTE — Plan of Care (Signed)

## 2021-10-11 NOTE — Progress Notes (Addendum)
PROGRESS NOTE    [name]  [id number] DOB: [birth year] DOA: 10/09/2021 PCP: Carrolyn Meiers, MD    Chief Complaint  Patient presents with   Abdominal Pain    Brief Narrative:  As per H&P written by Dr. Waldron Labs on 10/09/2021  [name]  is a 71 y.o. female, with past medical history of CVA, CAD, hypertension, hyperlipidemia, functional constipation, patient presents to ED secondary to complaints of abdominal pain, nausea and vomiting, symptoms been going on for last week, she has chronic constipation as well, she mentioned symptoms to her son yesterday, who prompted her to ED, reports symptoms are worse at nighttime, he does report some cough, she denies dyspnea, fever, chills, no history of previous GI symptoms, no coffee-ground emesis, no bright red blood per rectum. -In ED CT abdomen pelvis significant for choledocholithiasis, LFTs are stable, low-grade temperature 99.2, white blood cell count of 13.6, potassium is low at 2.7, creatinine is up at 1.05, ED discussed with gastroenterology, plan for ERCP tomorrow by Dr. Laural Golden, [facility] consulted to admit.   Assessment & Plan: 1-cholelithiasis with Choledocholithiasis -Currently afebrile -Normal LFTs; WBCs started to trend down. -Images suggesting obstructive stone in the distal bile duct. -Continue IV fluids, supportive care and current antibiotic therapy -Patient allowed to have clear liquid diet. -GI planning for repeat ERCP on 10/13/2021. -General surgery also consulted as patient will need cholecystectomy most likely 10/14/21. -Continue analgesics and antiemetics as needed  2-Essential hypertension -Stable overall -Continue current antihypertensive agents. -Continue to follow vital signs.  3-history of CAD (coronary artery disease), native coronary artery -No chest pain, no shortness of breath -Continue telemetry monitoring. -Continue beta-blocker, continue holding aspirin in the setting of  anticipated procedures.  4-History of stroke -No deficits or new complaints -Resume aspirin for secondary prevention at time of discharge.  5-GERD/GI prophylaxis -Continue PPI.  6-chronic constipation -Resume bowel movement regimen. -Follow GI recommendations.  7-hypokalemia -In the setting of GI losses and decreased oral intake -Repleted currently -Continue to follow electrolytes and further replete as needed. -Will discontinue telemetry.  8-macrocytosis with B12 and folate deficiency -Patient has been started on [id number] and folic acid repletion.  DVT prophylaxis: SCDs Code Status: Full code Family Communication: Son at bedside. Disposition:   Status is: Inpatient    Consultants:  Gastroenterology service General surgery  Procedures:  See below for x-ray report ERCP: Attempted on 10/10/2021; scope fell back into the stomach resulting in loss of cannulation and inability to perform sphincterectomy.  Plan is to repeat ERCP on 10/13/2021.  Antimicrobials:  Cipro and Flagyl   Subjective: Reporting decreased appetite; no chest pain, no nausea, no vomiting, no shortness of breath.  Status post attempted ERCP with our ability to recannulate bile duct and complete stone extraction.  Patient is afebrile and expressed just intermittent abdominal discomfort.  Tolerating clear liquid diet.  Objective: Vitals:   10/10/21 2123 10/11/21 0607 10/11/21 1056 10/11/21 1327  BP: (!) 100/50 (!) 116/55 131/60 (!) 106/47  Pulse: 96 78 83 63  Resp: 19 18  18   Temp: 98.4 F (36.9 C) 98.8 F (37.1 C)  98.9 F (37.2 C)  TempSrc:    Oral  SpO2: 100% 97%  97%  Weight:      Height:        Intake/Output Summary (Last 24 hours) at 10/11/2021 1440 Last data filed at 10/11/2021 1300 Gross per 24 hour  Intake 1700 ml  Output --  Net 1700 ml   [employer]  10/09/21 1222  Weight: 47.6 kg    Examination: General exam: Alert, awake, oriented x 3; reports no nausea or vomiting; still  having intermittent abdominal discomfort.  Patient is afebrile. Respiratory system: Clear to auscultation. Respiratory effort normal.  No requiring oxygen supplementation.  Good saturation on room air. Cardiovascular system:RRR. No murmurs, rubs, gallops.  No JVD. Gastrointestinal system: Abdomen is nondistended, soft and with positive bowel sounds currently.  Patient expressed mild discomfort basically midepigastric and right upper quadrant with deep palpation.   Neurology: Alert and oriented. No focal neurological deficits. Extremities: No cyanosis, clubbing or edema. Skin: No petechiae. Psychiatry: Judgement and insight appear normal. Mood & affect appropriate.    Data Reviewed: I have personally reviewed following labs and imaging studies  CBC: Recent Labs  Lab 10/09/21 1307 10/10/21 0413 10/11/21 0346  WBC 13.6* 12.3* 13.2*  HGB 14.4 12.3 12.6  HCT 44.4 39.0 39.4  MCV 108.0* 108.0* 107.9*  PLT 233 231 [id number]    Basic Metabolic Panel: Recent Labs  Lab 10/09/21 1307 10/09/21 2230 10/10/21 0413 10/11/21 0346  NA 143 139 141 138  K 2.7* 3.7 5.0 3.6  CL 106 106 111 106  CO2 23 22 23 23   GLUCOSE 172* 183* 107* 179*  BUN 8 6* 8 12  CREATININE 1.05* 0.98 1.08* 1.08*  CALCIUM 9.9 9.0 9.2 9.1    GFR: Estimated Creatinine Clearance: 33.8 mL/min (A) (by C-G formula based on SCr of 1.08 mg/dL (H)).  Liver Function Tests: Recent Labs  Lab 10/09/21 1307 10/10/21 0413 10/11/21 0346  AST 27 24 33  ALT 17 14 16   ALKPHOS 112 86 88  BILITOT 1.0 1.1 1.3*  PROT 8.4* 6.7 7.1  ALBUMIN 4.3 3.3* 3.5    CBG: No results for input(s): GLUCAP in the last 168 hours.   Recent Results (from the past 240 hour(s))  Resp Panel by RT-PCR (Flu A&B, Covid) Nasopharyngeal Swab     Status: None   Collection Time: 10/09/21  3:58 PM   Specimen: Nasopharyngeal Swab; Nasopharyngeal(NP) swabs in vial transport medium  Result Value Ref Range Status   SARS Coronavirus 2 by RT PCR NEGATIVE  NEGATIVE Final    Comment: (NOTE) SARS-CoV-2 target nucleic acids are NOT DETECTED.  The SARS-CoV-2 RNA is generally detectable in upper respiratory specimens during the acute phase of infection. The lowest concentration of SARS-CoV-2 viral copies this assay can detect is 138 copies/mL. A negative result does not preclude SARS-Cov-2 infection and should not be used as the sole basis for treatment or other patient management decisions. A negative result may occur with  improper specimen collection/handling, submission of specimen other than nasopharyngeal swab, presence of viral mutation(s) within the areas targeted by this assay, and inadequate number of viral copies(<138 copies/mL). A negative result must be combined with clinical observations, patient history, and epidemiological information. The expected result is Negative.  Fact Sheet for Patients:  EntrepreneurPulse.com.au  Fact Sheet for Healthcare Providers:  IncredibleEmployment.be  This test is no t yet approved or cleared by the [place] FDA and  has been authorized for detection and/or diagnosis of SARS-CoV-2 by FDA under an Emergency Use Authorization (EUA). This EUA will remain  in effect (meaning this test can be used) for the duration of the COVID-19 declaration under Section 564(b)(1) of the Act, 21 U.S.C.section 360bbb-3(b)(1), unless the authorization is terminated  or revoked sooner.       Influenza A by PCR NEGATIVE NEGATIVE Final   Influenza B by PCR NEGATIVE  NEGATIVE Final    Comment: (NOTE) The Xpert Xpress SARS-CoV-2/FLU/RSV plus assay is intended as an aid in the diagnosis of influenza from Nasopharyngeal swab specimens and should not be used as a sole basis for treatment. Nasal washings and aspirates are unacceptable for Xpert Xpress SARS-CoV-2/FLU/RSV testing.  Fact Sheet for Patients: EntrepreneurPulse.com.au  Fact Sheet for Healthcare  Providers: IncredibleEmployment.be  This test is not yet approved or cleared by the [place] FDA and has been authorized for detection and/or diagnosis of SARS-CoV-2 by FDA under an Emergency Use Authorization (EUA). This EUA will remain in effect (meaning this test can be used) for the duration of the COVID-19 declaration under Section 564(b)(1) of the Act, 21 U.S.C. section 360bbb-3(b)(1), unless the authorization is terminated or revoked.  Performed at [facility], [address], [place], [place] [address]      Radiology Studies: CT ABDOMEN PELVIS WO CONTRAST  Result Date: 10/09/2021 CLINICAL DATA:  Nausea, vomiting and abdominal pain. EXAM: CT ABDOMEN AND PELVIS WITHOUT CONTRAST TECHNIQUE: Multidetector CT imaging of the abdomen and pelvis was performed following the standard protocol without IV contrast. RADIATION DOSE REDUCTION: This exam was performed according to the departmental dose-optimization program which includes automated exposure control, adjustment of the mA and/or kV according to patient size and/or use of iterative reconstruction technique. COMPARISON:  07/16/2014 FINDINGS: Lower chest: No acute abnormality. Hepatobiliary: Multiple layering gallstones in the gallbladder lumen. The gallbladder is mildly distended and without obvious inflammation. Extrahepatic bile ducts are dilated with the common bile duct reaching maximum diameter of approximately 9-10 mm distally. In the distal common bile duct near the ampulla, focal calcific density measures up to approximately 6 mm in length on the coronal images and is consistent with at least one calculus in the distal common bile duct. The liver appears unremarkable. Pancreas: Unremarkable. No pancreatic ductal dilatation or surrounding inflammatory changes. Spleen: Normal in size without focal abnormality. Adrenals/Urinary Tract: Adrenal glands are unremarkable. Kidneys are normal, without renal calculi, focal  lesion, or hydronephrosis. Bladder is unremarkable. Stomach/Bowel: Bowel shows moderate fecal material throughout the colon without evidence of focal fecal impaction. No small bowel dilatation. No signs of free intraperitoneal air. Vascular/Lymphatic: Atherosclerosis of the abdominal aorta and iliac arteries without evidence of aneurysm. No enlarged lymph nodes identified in the abdomen or pelvis. Reproductive: Status post hysterectomy. No adnexal masses. Other: No abdominal wall hernia or abnormality. No abdominopelvic ascites. Musculoskeletal: No acute or significant osseous findings. IMPRESSION: 1. Cholelithiasis and gallbladder distension without obvious gallbladder inflammation. Evidence of choledocholithiasis with at least one calculus in the distal common bile duct and associated dilatation of the common bile duct up to a diameter of approximately 9-10 mm. 2. Moderate fecal material throughout the colon without focal fecal impaction or evidence of overt bowel obstruction. Electronically Signed   By: Aletta Edouard M.D.   On: 10/09/2021 15:05   DG Chest 2 View  Result Date: 10/09/2021 CLINICAL DATA:  Cough and chest pain. EXAM: CHEST - 2 VIEW COMPARISON:  Chest x-ray 04/19/2007. FINDINGS: There are emphysematous changes in the right upper lobe, unchanged. The lungs are otherwise clear. No pleural effusion or pneumothorax. No acute fractures. IMPRESSION: 1. No acute cardiopulmonary process. 2.  Emphysema (ICD10-J43.9). Electronically Signed   By: Ronney Asters M.D.   On: 10/09/2021 15:24   DG ERCP  Result Date: 10/10/2021 CLINICAL DATA:  Choledocholithiasis EXAM: ERCP COMPARISON:  CT AP, 10/09/2021 and 07/15/2014. FLUOROSCOPY TIME:  Fluoroscopy Time:  3 minutes 37 seconds Radiation Exposure  Index (if provided by the fluoroscopic device): 25.1 mGy Number of Acquired Spot Images: Multiple fluoroscopic cine loops, each containing 13 17, 17, 16, 10, 7, 7, 3, 3 and 1 images FINDINGS: Multiple, limited  oblique planar images of the RIGHT upper quadrant obtained C-arm. Images demonstrating flexible endoscopy, biliary duct cannulation, sphincterotomy, retrograde cholangiogram and balloon sweep. Common bile duct dilation is present, with at least 1 distal biliary filling defect appreciated. See key image. IMPRESSION: Fluoroscopic imaging for ERCP. Choledocholithiasis, with at least 1 distal biliary stone is demonstrated For complete description of intra procedural findings, please see performing service dictation. Electronically Signed   By: Michaelle Birks M.D.   On: 10/10/2021 19:10    Scheduled Meds:  amLODipine  10 mg Oral Daily   bisacodyl  10 mg Rectal BID   cloNIDine  0.3 mg Oral BID   cyanocobalamin  1,000 mcg Intramuscular Daily   folic acid  1 mg Oral Daily   linaclotide  72 mcg Oral Daily   metoprolol tartrate  50 mg Oral BID   pantoprazole  40 mg Oral Daily   polyethylene glycol  34 g Oral Daily   senna-docusate  2 tablet Oral BID   Continuous Infusions:  sodium chloride 50 mL/hr at 10/11/21 0810   ciprofloxacin 400 mg (10/11/21 0427)   metronidazole 500 mg (10/11/21 0813)     LOS: 2 days    Barton Dubois, MD [facility]   To contact the attending provider between 7A-7P or the covering provider during after hours 7P-7A, please log into the web site [email or web address] and access using universal [place] password for that web site. If you do not have the password, please call the hospital operator.  10/11/2021, 2:40 PM

## 2021-10-11 NOTE — Progress Notes (Signed)
Patient rested throughout the night. BP running low and HS dose of Metoprolol and Clonidine held. Pain Medication giving x1 dose.

## 2021-10-11 NOTE — Consult Note (Signed)
Events of yesterday discussed with Dr. Karilyn Cota.  I did tell him that I would prefer that she undergoes an ERCP with stone extraction prior to cholecystectomy.  This was also explained to the patient and family.  Cholecystectomy will be performed after successful ERCP.  The risks and benefits of the procedure including bleeding, infection, hepatobiliary injury, the possibility of an open procedure were fully explained to the patient, who gave informed consent.

## 2021-10-11 NOTE — Progress Notes (Signed)
Subjective:  Patient complains of intermittent pain.  She says about the same as before the procedure.  She was able to sleep last night.  He did have a bowel movement last night.  She is passing flatus.  Her son Elta Guadeloupe was at bedside said that she has been drinking ginger ale.  Current Medications:  Current Facility-Administered Medications:    0.9 %  sodium chloride infusion, , Intravenous, Continuous, Marcell Pfeifer U, MD, Last Rate: 50 mL/hr at 10/11/21 0810, New Bag at 10/11/21 0810   acetaminophen (TYLENOL) tablet 650 mg, 650 mg, Oral, Q6H PRN **OR** acetaminophen (TYLENOL) suppository 650 mg, 650 mg, Rectal, Q6H PRN, Yesica Kemler U, MD   amLODipine (NORVASC) tablet 10 mg, 10 mg, Oral, Daily, Donneisha Beane U, MD, 10 mg at 10/11/21 1106   bisacodyl (DULCOLAX) suppository 10 mg, 10 mg, Rectal, BID, Shaymus Eveleth U, MD, 10 mg at 10/11/21 1106   ciprofloxacin (CIPRO) IVPB 400 mg, 400 mg, Intravenous, Q12H, Casia Corti U, MD, Last Rate: 200 mL/hr at 10/11/21 0427, 400 mg at 10/11/21 0427   cloNIDine (CATAPRES) tablet 0.3 mg, 0.3 mg, Oral, BID, Cameren Earnest U, MD, 0.3 mg at 10/11/21 1107   cyanocobalamin ((VITAMIN B-12)) injection 1,000 mcg, 1,000 mcg, Intramuscular, Daily, Barton Dubois, MD, 1,000 mcg at 68/34/19 6222   folic acid (FOLVITE) tablet 1 mg, 1 mg, Oral, Daily, Barton Dubois, MD, 1 mg at 10/11/21 1107   HYDROcodone-acetaminophen (NORCO/VICODIN) 5-325 MG per tablet 1-2 tablet, 1-2 tablet, Oral, Q4H PRN, Rogene Houston, MD, 1 tablet at 10/11/21 9798   linaclotide (LINZESS) capsule 72 mcg, 72 mcg, Oral, Daily, Geneva Barrero U, MD, 72 mcg at 10/11/21 1106   metoprolol tartrate (LOPRESSOR) tablet 50 mg, 50 mg, Oral, BID, Dhruvi Crenshaw U, MD, 50 mg at 10/11/21 1106   metroNIDAZOLE (FLAGYL) IVPB 500 mg, 500 mg, Intravenous, Q8H, Rankin Coolman U, MD, Last Rate: 100 mL/hr at 10/11/21 0813, 500 mg at 10/11/21 0813   morphine 2 MG/ML injection 1 mg, 1 mg, Intravenous, Q2H PRN,  Paulette Rockford U, MD   pantoprazole (PROTONIX) EC tablet 40 mg, 40 mg, Oral, Daily, Maureen Duesing U, MD, 40 mg at 10/11/21 1107   polyethylene glycol (MIRALAX / GLYCOLAX) packet 34 g, 34 g, Oral, Daily, Nikoleta Dady U, MD, 34 g at 10/11/21 1106   senna-docusate (Senokot-S) tablet 2 tablet, 2 tablet, Oral, BID, Hadiyah Maricle U, MD, 2 tablet at 10/11/21 1107   Objective: Blood pressure 131/60, pulse 83, temperature 98.8 F (37.1 C), resp. rate 18, height 5' (1.524 m), weight 47.6 kg, SpO2 97 %. Patient is alert and does not appear to be in any distress. Lungs are clear to auscultation. Abdomen is symmetrical.  Bowel sounds are normal.  She has mild tenderness in the right upper quadrant and midepigastric region without guarding.  No organomegaly or masses.  Labs/studies Results:   CBC Latest Ref Rng & Units 10/11/2021 10/10/2021 10/09/2021  WBC 4.0 - 10.5 K/uL 13.2(H) 12.3(H) 13.6(H)  Hemoglobin 12.0 - 15.0 g/dL 12.6 12.3 14.4  Hematocrit 36.0 - 46.0 % 39.4 39.0 44.4  Platelets 150 - 400 K/uL 230 231 233    CMP Latest Ref Rng & Units 10/11/2021 10/10/2021 10/09/2021  Glucose 70 - 99 mg/dL 179(H) 107(H) 183(H)  BUN 8 - 23 mg/dL 12 8 6(L)  Creatinine 0.44 - 1.00 mg/dL 1.08(H) 1.08(H) 0.98  Sodium 135 - 145 mmol/L 138 141 139  Potassium 3.5 - 5.1 mmol/L 3.6 5.0 3.7  Chloride 98 - 111  mmol/L 106 111 106  CO2 22 - 32 mmol/L 23 23 22   Calcium 8.9 - 10.3 mg/dL 9.1 9.2 9.0  Total Protein 6.5 - 8.1 g/dL 7.1 6.7 -  Total Bilirubin 0.3 - 1.2 mg/dL 1.3(H) 1.1 -  Alkaline Phos 38 - 126 U/L 88 86 -  AST 15 - 41 U/L 33 24 -  ALT 0 - 44 U/L 16 14 -    Hepatic Function Latest Ref Rng & Units 10/11/2021 10/10/2021 10/09/2021  Total Protein 6.5 - 8.1 g/dL 7.1 6.7 8.4(H)  Albumin 3.5 - 5.0 g/dL 3.5 3.3(L) 4.3  AST 15 - 41 U/L 33 24 27  ALT 0 - 44 U/L 16 14 17   Alk Phosphatase 38 - 126 U/L 88 86 112  Total Bilirubin 0.3 - 1.2 mg/dL 1.3(H) 1.1 1.0    Serum amylase 117  B12 level 167 Folate level  4.2.   Assessment:  #1.  Choledocholithiasis.  Patient underwent ERCP with sphincterotomy yesterday.  Before sphincterotomy was completed due to the scope fell back into stomach resulting in loss of cannulation and bile duct could not be recannulated.  Postprocedure she is stable.  She is having intermittent pain.  Serum amylase only mildly elevated.  She will need repeat ERCP on 10/13/2021.  #2.  Cholelithiasis.  Dr. Arnoldo Morale is seeing patient for cholecystectomy.  #3.  Low B12 and folate levels.  Dr. Dyann Kief has started patient on p.o. folic acid and parenteral vitamin B12.  #4.  Weight loss may be secondary to B12 deficiency.  If weight loss continues despite treatment for biliary tract disease and B12 and folate replacement will look for other causes.   Recommendations  ERCP repeat ERCP possibly on 10/13/2021 Continue clear liquids.

## 2021-10-12 MED ORDER — CHLORHEXIDINE GLUCONATE CLOTH 2 % EX PADS
6.0000 | MEDICATED_PAD | Freq: Once | CUTANEOUS | Status: AC
  Administered 2021-10-12: 6 via TOPICAL

## 2021-10-12 MED ORDER — CHLORHEXIDINE GLUCONATE CLOTH 2 % EX PADS: 6.0000 | MEDICATED_PAD | Freq: Once | CUTANEOUS | Status: DC

## 2021-10-12 NOTE — Progress Notes (Signed)
PROGRESS NOTE    [name]  [id number] DOB: 11-12-1950 DOA: 10/09/2021 PCP: Carrolyn Meiers, MD    Chief Complaint  Patient presents with   Abdominal Pain    Brief Narrative:  As per H&P written by Dr. Waldron Labs on 10/09/2021  [name]  is a 71 y.o. female, with past medical history of CVA, CAD, hypertension, hyperlipidemia, functional constipation, patient presents to ED secondary to complaints of abdominal pain, nausea and vomiting, symptoms been going on for last week, she has chronic constipation as well, she mentioned symptoms to her son yesterday, who prompted her to ED, reports symptoms are worse at nighttime, he does report some cough, she denies dyspnea, fever, chills, no history of previous GI symptoms, no coffee-ground emesis, no bright red blood per rectum. -In ED CT abdomen pelvis significant for choledocholithiasis, LFTs are stable, low-grade temperature 99.2, white blood cell count of 13.6, potassium is low at 2.7, creatinine is up at 1.05, ED discussed with gastroenterology, plan for ERCP tomorrow by Dr. Laural Golden, [facility] consulted to admit.   Assessment & Plan: 1-cholelithiasis with Choledocholithiasis -Currently afebrile -Normal LFTs; WBCs started to trend down. -Images suggesting obstructive stone in the distal bile duct. -Continue IV fluids, supportive care and current antibiotic therapy -Patient allowed to have clear liquid diet. -GI planning for repeat ERCP tomorrow 10/13/2021. -General surgery also consulted as patient will need cholecystectomy; tentatively 10/14/21 after a stone removed from her bile duct.. -Continue analgesics and antiemetics as needed  2-Essential hypertension -Stable overall -Continue current antihypertensive agents. -Continue to follow vital signs.  3-history of CAD (coronary artery disease), native coronary artery -No chest pain, no shortness of breath -Continue telemetry monitoring. -Continue  beta-blocker, continue holding aspirin in the setting of anticipated procedures.  4-History of stroke -No deficits or new complaints -Resume aspirin for secondary prevention at time of discharge.  5-GERD/GI prophylaxis -Continue PPI.  6-chronic constipation -Resume bowel movement regimen. -Follow GI recommendations.  7-hypokalemia -In the setting of GI losses and decreased oral intake -Repleted currently -Continue to follow electrolytes and further replete as needed. -Will discontinue telemetry.  8-macrocytosis with B12 and folate deficiency -Patient has been started on [id number] and folic acid repletion.  DVT prophylaxis: SCDs Code Status: Full code Family Communication: Son at bedside. Disposition:   Status is: Inpatient    Consultants:  Gastroenterology service General surgery  Procedures:  See below for x-ray report ERCP: Attempted on 10/10/2021; scope fell back into the stomach resulting in loss of cannulation and inability to perform sphincterectomy.  Plan is to repeat ERCP on 10/13/2021.  Antimicrobials:  Cipro and Flagyl   Subjective: Overall tolerating liquid diet; no chest pain, no shortness of breath; no vomiting.  Reports intermittent nausea and also intermittent abdominal discomfort.  Objective: Vitals:   10/11/21 2114 10/12/21 0419 10/12/21 0934 10/12/21 1300  BP: 122/69 (!) 100/49 (!) 111/51 (!) 150/95  Pulse: 73 60 66 76  Resp: 18   18  Temp: 99.3 F (37.4 C) 98.7 F (37.1 C)  98.6 F (37 C)  TempSrc: Oral   Oral  SpO2: 96% 98%  100%  Weight:      Height:        Intake/Output Summary (Last 24 hours) at 10/12/2021 1648 Last data filed at 10/12/2021 1514 Gross per 24 hour  Intake 2624.88 ml  Output --  Net 2624.88 ml   Filed Weights   10/09/21 1222  Weight: 47.6 kg    Examination: General exam: Alert, awake, oriented x  3; reports having some abdominal pain and mild nausea.  No vomiting; tolerating liquid diet. Respiratory system: Clear to  auscultation. Respiratory effort normal.  No using accessory muscles; good saturation on room air. Cardiovascular system:RRR. No murmurs, rubs, gallops.  No JVD. Gastrointestinal system: Abdomen is soft, nondistended, positive bowel sounds; midepigastric/right upper quadrant discomfort with deep palpation. Central nervous system: Alert and oriented. No focal neurological deficits. Extremities: No cyanosis, clubbing or edema. Skin: No rashes, no petechiae. Psychiatry: Judgement and insight appear normal. Mood & affect appropriate.    Data Reviewed: I have personally reviewed following labs and imaging studies  CBC: Recent Labs  Lab 10/09/21 1307 10/10/21 0413 10/11/21 0346  WBC 13.6* 12.3* 13.2*  HGB 14.4 12.3 12.6  HCT 44.4 39.0 39.4  MCV 108.0* 108.0* 107.9*  PLT 233 231 [id number]    Basic Metabolic Panel: Recent Labs  Lab 10/09/21 1307 10/09/21 2230 10/10/21 0413 10/11/21 0346  NA 143 139 141 138  K 2.7* 3.7 5.0 3.6  CL 106 106 111 106  CO2 23 22 23 23   GLUCOSE 172* 183* 107* 179*  BUN 8 6* 8 12  CREATININE 1.05* 0.98 1.08* 1.08*  CALCIUM 9.9 9.0 9.2 9.1    GFR: Estimated Creatinine Clearance: 33.8 mL/min (A) (by C-G formula based on SCr of 1.08 mg/dL (H)).  Liver Function Tests: Recent Labs  Lab 10/09/21 1307 10/10/21 0413 10/11/21 0346  AST 27 24 33  ALT 17 14 16   ALKPHOS 112 86 88  BILITOT 1.0 1.1 1.3*  PROT 8.4* 6.7 7.1  ALBUMIN 4.3 3.3* 3.5    CBG: No results for input(s): GLUCAP in the last 168 hours.   Recent Results (from the past 240 hour(s))  Resp Panel by RT-PCR (Flu A&B, Covid) Nasopharyngeal Swab     Status: None   Collection Time: 10/09/21  3:58 PM   Specimen: Nasopharyngeal Swab; Nasopharyngeal(NP) swabs in vial transport medium  Result Value Ref Range Status   SARS Coronavirus 2 by RT PCR NEGATIVE NEGATIVE Final    Comment: (NOTE) SARS-CoV-2 target nucleic acids are NOT DETECTED.  The SARS-CoV-2 RNA is generally detectable in upper  respiratory specimens during the acute phase of infection. The lowest concentration of SARS-CoV-2 viral copies this assay can detect is 138 copies/mL. A negative result does not preclude SARS-Cov-2 infection and should not be used as the sole basis for treatment or other patient management decisions. A negative result may occur with  improper specimen collection/handling, submission of specimen other than nasopharyngeal swab, presence of viral mutation(s) within the areas targeted by this assay, and inadequate number of viral copies(<138 copies/mL). A negative result must be combined with clinical observations, patient history, and epidemiological information. The expected result is Negative.  Fact Sheet for Patients:  EntrepreneurPulse.com.au  Fact Sheet for Healthcare Providers:  IncredibleEmployment.be  This test is no t yet approved or cleared by the [place] FDA and  has been authorized for detection and/or diagnosis of SARS-CoV-2 by FDA under an Emergency Use Authorization (EUA). This EUA will remain  in effect (meaning this test can be used) for the duration of the COVID-19 declaration under Section 564(b)(1) of the Act, 21 U.S.C.section 360bbb-3(b)(1), unless the authorization is terminated  or revoked sooner.       Influenza A by PCR NEGATIVE NEGATIVE Final   Influenza B by PCR NEGATIVE NEGATIVE Final    Comment: (NOTE) The Xpert Xpress SARS-CoV-2/FLU/RSV plus assay is intended as an aid in the diagnosis of influenza from Nasopharyngeal  swab specimens and should not be used as a sole basis for treatment. Nasal washings and aspirates are unacceptable for Xpert Xpress SARS-CoV-2/FLU/RSV testing.  Fact Sheet for Patients: EntrepreneurPulse.com.au  Fact Sheet for Healthcare Providers: IncredibleEmployment.be  This test is not yet approved or cleared by the [place] FDA and has been  authorized for detection and/or diagnosis of SARS-CoV-2 by FDA under an Emergency Use Authorization (EUA). This EUA will remain in effect (meaning this test can be used) for the duration of the COVID-19 declaration under Section 564(b)(1) of the Act, 21 U.S.C. section 360bbb-3(b)(1), unless the authorization is terminated or revoked.  Performed at [facility], [address] [place], [place] [address]      Radiology Studies: No results found.  Scheduled Meds:  amLODipine  10 mg Oral Daily   cloNIDine  0.3 mg Oral BID   cyanocobalamin  1,000 mcg Intramuscular Daily   folic acid  1 mg Oral Daily   linaclotide  72 mcg Oral Daily   metoprolol tartrate  50 mg Oral BID   pantoprazole  40 mg Oral Daily   senna-docusate  2 tablet Oral BID   Continuous Infusions:  sodium chloride 50 mL/hr at 10/12/21 1514   ciprofloxacin 400 mg (10/12/21 0523)   metronidazole Stopped (10/12/21 1043)     LOS: 3 days    Barton Dubois, MD [facility]   To contact the attending provider between 7A-7P or the covering provider during after hours 7P-7A, please log into the web site [email or web address] and access using universal [place] password for that web site. If you do not have the password, please call the hospital operator.  10/12/2021, 4:48 PM

## 2021-10-12 NOTE — Progress Notes (Signed)
VS stable. PO pain medication given x1 with relief. Patient did c/o nausea but refused medication when offered. Will continue to monitor.

## 2021-10-12 NOTE — Progress Notes (Signed)
Patient alert and verbal. Reports no complaints of pain or nausea. Patient wanting diet to be changed due to only allowed to have liquids. MD [facility] aware. Consent signed for procedure tomorrow, placed in patients chart.

## 2021-10-12 NOTE — Progress Notes (Addendum)
Subjective:  Patient continues complain of pain in right upper quadrant and epigastric region.  She says pain is more pronounced in right upper quadrant.  It has not changed since she was admitted.  She is having intermittent nausea.  She vomited clear liquids yesterday.  She has not had a bowel movement in 2 days.  She drank her coffee and liquids this morning and did not throw up.  She denies chest pain or shortness of breath.  Current Medications:  Current Facility-Administered Medications:    0.9 %  sodium chloride infusion, , Intravenous, Continuous, Kaidon Kinker U, MD, Last Rate: 50 mL/hr at 10/11/21 0810, New Bag at 10/11/21 0810   acetaminophen (TYLENOL) tablet 650 mg, 650 mg, Oral, Q6H PRN **OR** acetaminophen (TYLENOL) suppository 650 mg, 650 mg, Rectal, Q6H PRN, Brand Siever U, MD   amLODipine (NORVASC) tablet 10 mg, 10 mg, Oral, Daily, Sheryl Saintil U, MD, 10 mg at 10/11/21 1106   ciprofloxacin (CIPRO) IVPB 400 mg, 400 mg, Intravenous, Q12H, Moyinoluwa Dawe U, MD, Last Rate: 200 mL/hr at 10/12/21 0523, 400 mg at 10/12/21 9675   cloNIDine (CATAPRES) tablet 0.3 mg, 0.3 mg, Oral, BID, Ivannia Willhelm U, MD, 0.3 mg at 10/11/21 2120   cyanocobalamin ((VITAMIN B-12)) injection 1,000 mcg, 1,000 mcg, Intramuscular, Daily, Barton Dubois, MD, 1,000 mcg at 91/63/84 6659   folic acid (FOLVITE) tablet 1 mg, 1 mg, Oral, Daily, Barton Dubois, MD, 1 mg at 10/12/21 9357   HYDROcodone-acetaminophen (NORCO/VICODIN) 5-325 MG per tablet 1-2 tablet, 1-2 tablet, Oral, Q4H PRN, Rogene Houston, MD, 1 tablet at 10/12/21 0525   linaclotide (LINZESS) capsule 72 mcg, 72 mcg, Oral, Daily, Vanilla Heatherington U, MD, 72 mcg at 10/12/21 0924   metoprolol tartrate (LOPRESSOR) tablet 50 mg, 50 mg, Oral, BID, Wolfgang Finigan U, MD, 50 mg at 10/12/21 0934   metroNIDAZOLE (FLAGYL) IVPB 500 mg, 500 mg, Intravenous, Q8H, Sohrab Keelan U, MD, Last Rate: 100 mL/hr at 10/12/21 0942, 500 mg at 10/12/21 0942   morphine 2 MG/ML  injection 1 mg, 1 mg, Intravenous, Q2H PRN, Gibril Mastro U, MD   pantoprazole (PROTONIX) EC tablet 40 mg, 40 mg, Oral, Daily, August Longest U, MD, 40 mg at 10/12/21 0177   senna-docusate (Senokot-S) tablet 2 tablet, 2 tablet, Oral, BID, Neshawn Aird U, MD, 2 tablet at 10/11/21 2121   Objective: Blood pressure (!) 111/51, pulse 66, temperature 98.7 F (37.1 C), resp. rate 18, height 5' (1.524 m), weight 47.6 kg, SpO2 98 %. Patient is alert and does not appear to be in any distress. Lungs are clear to auscultation. Abdomen is symmetrical.  Bowel sounds are normal.  Abdomen is soft.  She remains with mild to moderate tenderness in right upper quadrant with some guarding which she had on admission.  Mild tenderness in midepigastric region.  No organomegaly or masses.  Labs/studies Results:   CBC Latest Ref Rng & Units 10/11/2021 10/10/2021 10/09/2021  WBC 4.0 - 10.5 K/uL 13.2(H) 12.3(H) 13.6(H)  Hemoglobin 12.0 - 15.0 g/dL 12.6 12.3 14.4  Hematocrit 36.0 - 46.0 % 39.4 39.0 44.4  Platelets 150 - 400 K/uL 230 231 233    CMP Latest Ref Rng & Units 10/11/2021 10/10/2021 10/09/2021  Glucose 70 - 99 mg/dL 179(H) 107(H) 183(H)  BUN 8 - 23 mg/dL 12 8 6(L)  Creatinine 0.44 - 1.00 mg/dL 1.08(H) 1.08(H) 0.98  Sodium 135 - 145 mmol/L 138 141 139  Potassium 3.5 - 5.1 mmol/L 3.6 5.0 3.7  Chloride 98 - 111 mmol/L  106 111 106  CO2 22 - 32 mmol/L 23 23 22   Calcium 8.9 - 10.3 mg/dL 9.1 9.2 9.0  Total Protein 6.5 - 8.1 g/dL 7.1 6.7 -  Total Bilirubin 0.3 - 1.2 mg/dL 1.3(H) 1.1 -  Alkaline Phos 38 - 126 U/L 88 86 -  AST 15 - 41 U/L 33 24 -  ALT 0 - 44 U/L 16 14 -    Hepatic Function Latest Ref Rng & Units 10/11/2021 10/10/2021 10/09/2021  Total Protein 6.5 - 8.1 g/dL 7.1 6.7 8.4(H)  Albumin 3.5 - 5.0 g/dL 3.5 3.3(L) 4.3  AST 15 - 41 U/L 33 24 27  ALT 0 - 44 U/L 16 14 17   Alk Phosphatase 38 - 126 U/L 88 86 112  Total Bilirubin 0.3 - 1.2 mg/dL 1.3(H) 1.1 1.0     Assessment:  #1.   Choledocholithiasis.  Patient underwent ERCP with sphincterotomy 2 days ago.  Before sphincterotomy could be completed cannulation was lost as scope fell back to stomach.  Cystic duct was cannulated repeatedly but not bile duct. Her pain persists.  She will undergo repeat procedure tomorrow.  #2.  Cholelithiasis.  Dr. Arnoldo Morale has seen the patient.  Cholecystectomy is planned after stone removed from her bile duct.  #3.  Low B12 and folate levels.  Patient begun on folic acid p.o. and vitamin B12 yesterday  #4.  Weight loss possibly multifactorial  Recommendations  ERCP with extension of sphincterotomy and stone extraction on 10/13/2020. CBC comprehensive chemistry panel and serum amylase in a.m.

## 2021-10-13 ENCOUNTER — Encounter (HOSPITAL_COMMUNITY)
Admission: EM | Disposition: A | Discharge status: Home / Self Care | Payer: Self-pay | Source: Home / Self Care | Attending: Internal Medicine

## 2021-10-13 ENCOUNTER — Inpatient Hospital Stay (HOSPITAL_COMMUNITY): Payer: Medicare Other | Admitting: Anesthesiology

## 2021-10-13 ENCOUNTER — Encounter (HOSPITAL_COMMUNITY): Payer: Self-pay | Admitting: Internal Medicine

## 2021-10-13 ENCOUNTER — Inpatient Hospital Stay (HOSPITAL_COMMUNITY): Payer: Medicare Other

## 2021-10-13 ENCOUNTER — Other Ambulatory Visit: Payer: Self-pay

## 2021-10-13 HISTORY — PX: ERCP: SHX5425

## 2021-10-13 LAB — COMPREHENSIVE METABOLIC PANEL
ALT: 15 U/L (ref 0–44)
AST: 30 U/L (ref 15–41)
Albumin: 2.8 g/dL — ABNORMAL LOW (ref 3.5–5.0)
Alkaline Phosphatase: 65 U/L (ref 38–126)
Anion gap: 3 — ABNORMAL LOW (ref 5–15)
BUN: 5 mg/dL — ABNORMAL LOW (ref 8–23)
CO2: 26 mmol/L (ref 22–32)
Calcium: 8 mg/dL — ABNORMAL LOW (ref 8.9–10.3)
Chloride: 110 mmol/L (ref 98–111)
Creatinine, Ser: 0.81 mg/dL (ref 0.44–1.00)
GFR, Estimated: >60 mL/min (ref >60)
Glucose, Bld: 98 mg/dL (ref 70–99)
Potassium: 3.1 mmol/L — ABNORMAL LOW (ref 3.5–5.1)
Sodium: 139 mmol/L (ref 135–145)
Total Bilirubin: 1.1 mg/dL (ref 0.3–1.2)
Total Protein: 5.6 g/dL — ABNORMAL LOW (ref 6.5–8.1)

## 2021-10-13 LAB — CBC
HCT: 35.2 % — ABNORMAL LOW (ref 36.0–46.0)
Hemoglobin: 10.8 g/dL — ABNORMAL LOW (ref 12.0–15.0)
MCH: 32.4 pg (ref 26.0–34.0)
MCHC: 30.7 g/dL (ref 30.0–36.0)
MCV: 105.7 fL — ABNORMAL HIGH (ref 80.0–100.0)
Platelets: 190 10*3/uL (ref 150–400)
RBC: 3.33 MIL/uL — ABNORMAL LOW (ref 3.87–5.11)
RDW: 12.4 % (ref 11.5–15.5)
WBC: 8.7 10*3/uL (ref 4.0–10.5)
nRBC: 0 % (ref 0.0–0.2)

## 2021-10-13 LAB — AMYLASE: Amylase: 64 U/L (ref 28–100)

## 2021-10-13 SURGERY — ERCP, WITH INTERVENTION IF INDICATED
Anesthesia: General | Site: Abdomen

## 2021-10-13 SURGERY — LAPAROSCOPIC CHOLECYSTECTOMY
Anesthesia: General

## 2021-10-13 MED ORDER — LIDOCAINE HCL (CARDIAC) PF 50 MG/5ML IV SOSY
PREFILLED_SYRINGE | INTRAVENOUS | Status: DC | PRN
  Administered 2021-10-13: 60 mg via INTRAVENOUS

## 2021-10-13 MED ORDER — LACTATED RINGERS IV SOLN: INTRAVENOUS | Status: DC

## 2021-10-13 MED ORDER — SODIUM CHLORIDE 0.9 % IV SOLN: INTRAVENOUS | Status: DC

## 2021-10-13 MED ORDER — SODIUM CHLORIDE 0.9 % IV SOLN
INTRAVENOUS | Status: DC | PRN
  Administered 2021-10-13: 9 mL

## 2021-10-13 MED ORDER — SUGAMMADEX SODIUM 200 MG/2ML IV SOLN
INTRAVENOUS | Status: DC | PRN
  Administered 2021-10-13 (×2): 95.2 mg via INTRAVENOUS

## 2021-10-13 MED ORDER — ONDANSETRON HCL 4 MG/2ML IJ SOLN: 4.0000 mg | Freq: Once | INTRAMUSCULAR | Status: DC | PRN

## 2021-10-13 MED ORDER — POTASSIUM CHLORIDE 10 MEQ/100ML IV SOLN
10.0000 meq | INTRAVENOUS | Status: AC
  Administered 2021-10-13: 10 meq via INTRAVENOUS
  Filled 2021-10-13: qty 100

## 2021-10-13 MED ORDER — DEXMEDETOMIDINE (PRECEDEX) IN NS 20 MCG/5ML (4 MCG/ML) IV SYRINGE
PREFILLED_SYRINGE | INTRAVENOUS | Status: AC
  Filled 2021-10-13: qty 5

## 2021-10-13 MED ORDER — POTASSIUM CHLORIDE 10 MEQ/100ML IV SOLN
10.0000 meq | INTRAVENOUS | Status: AC
  Administered 2021-10-13 (×4): 10 meq via INTRAVENOUS
  Filled 2021-10-13 (×4): qty 100

## 2021-10-13 MED ORDER — GLUCAGON HCL RDNA (DIAGNOSTIC) 1 MG IJ SOLR
INTRAMUSCULAR | Status: DC | PRN
  Administered 2021-10-13 (×5): .25 mg via INTRAVENOUS

## 2021-10-13 MED ORDER — PROPOFOL 10 MG/ML IV BOLUS
INTRAVENOUS | Status: DC | PRN
Start: 2021-10-13 — End: 2021-10-13
  Administered 2021-10-13: 140 mg via INTRAVENOUS

## 2021-10-13 MED ORDER — ORAL CARE MOUTH RINSE: 15.0000 mL | Freq: Once | OROMUCOSAL | Status: AC

## 2021-10-13 MED ORDER — STERILE WATER FOR IRRIGATION IR SOLN
Status: DC | PRN
  Administered 2021-10-13: 1000 mL

## 2021-10-13 MED ORDER — CHLORHEXIDINE GLUCONATE 0.12 % MT SOLN
15.0000 mL | Freq: Once | OROMUCOSAL | Status: AC
  Administered 2021-10-13: 15 mL via OROMUCOSAL

## 2021-10-13 MED ORDER — INDOMETHACIN 50 MG RE SUPP
100.0000 mg | Freq: Once | RECTAL | Status: AC
  Administered 2021-10-13: 50 mg via RECTAL
  Filled 2021-10-13 (×2): qty 2

## 2021-10-13 MED ORDER — ROCURONIUM BROMIDE 10 MG/ML (PF) SYRINGE
PREFILLED_SYRINGE | INTRAVENOUS | Status: AC
  Filled 2021-10-13: qty 10

## 2021-10-13 MED ORDER — PHENOL 1.4 % MT LIQD
1.0000 | OROMUCOSAL | Status: DC | PRN
  Administered 2021-10-14: 06:00:00 1 via OROMUCOSAL
  Filled 2021-10-13: qty 177

## 2021-10-13 MED ORDER — FENTANYL CITRATE PF 50 MCG/ML IJ SOSY: 25.0000 ug | PREFILLED_SYRINGE | INTRAMUSCULAR | Status: DC | PRN

## 2021-10-13 MED ORDER — DIPHENHYDRAMINE HCL 50 MG/ML IJ SOLN
INTRAMUSCULAR | Status: DC | PRN
  Administered 2021-10-13: 50 mg via INTRAVENOUS

## 2021-10-13 MED ORDER — SODIUM CHLORIDE 0.9 % IV SOLN
INTRAVENOUS | Status: AC
  Filled 2021-10-13: qty 50

## 2021-10-13 MED ORDER — ROCURONIUM BROMIDE 100 MG/10ML IV SOLN
INTRAVENOUS | Status: DC | PRN
  Administered 2021-10-13: 10 mg via INTRAVENOUS
  Administered 2021-10-13: 40 mg via INTRAVENOUS

## 2021-10-13 MED ORDER — ONDANSETRON HCL 4 MG/2ML IJ SOLN
INTRAMUSCULAR | Status: AC
  Filled 2021-10-13: qty 2

## 2021-10-13 MED ORDER — PHENYLEPHRINE 40 MCG/ML (10ML) SYRINGE FOR IV PUSH (FOR BLOOD PRESSURE SUPPORT)
PREFILLED_SYRINGE | INTRAVENOUS | Status: AC
  Filled 2021-10-13: qty 10

## 2021-10-13 MED ORDER — BUPIVACAINE LIPOSOME 1.3 % IJ SUSP
INTRAMUSCULAR | Status: AC
  Filled 2021-10-13: qty 20

## 2021-10-13 MED ORDER — LIDOCAINE HCL (PF) 2 % IJ SOLN
INTRAMUSCULAR | Status: AC
  Filled 2021-10-13: qty 5

## 2021-10-13 MED ORDER — INDOMETHACIN 50 MG RE SUPP: 50.0000 mg | Freq: Once | RECTAL | Status: DC

## 2021-10-13 MED ORDER — GLUCAGON HCL RDNA (DIAGNOSTIC) 1 MG IJ SOLR
INTRAMUSCULAR | Status: AC
  Filled 2021-10-13: qty 2

## 2021-10-13 MED ORDER — ONDANSETRON HCL 4 MG/2ML IJ SOLN
INTRAMUSCULAR | Status: DC | PRN
  Administered 2021-10-13: 4 mg via INTRAVENOUS

## 2021-10-13 MED ORDER — FENTANYL CITRATE (PF) 100 MCG/2ML IJ SOLN
INTRAMUSCULAR | Status: AC
  Filled 2021-10-13: qty 2

## 2021-10-13 MED ORDER — DIPHENHYDRAMINE HCL 50 MG/ML IJ SOLN
INTRAMUSCULAR | Status: AC
  Filled 2021-10-13: qty 1

## 2021-10-13 MED ORDER — PHENYLEPHRINE HCL (PRESSORS) 10 MG/ML IV SOLN
INTRAVENOUS | Status: DC | PRN
  Administered 2021-10-13: 120 ug via INTRAVENOUS
  Administered 2021-10-13 (×2): 80 ug via INTRAVENOUS
  Administered 2021-10-13 (×2): 40 ug via INTRAVENOUS

## 2021-10-13 SURGICAL SUPPLY — 22 items
BALLN RETRIEVAL 12X15 (BALLOONS) IMPLANT
BASKET TRAPEZOID 3X6 (MISCELLANEOUS) IMPLANT
BASKET TRAPEZOID LITHO 2.0X5 (MISCELLANEOUS) IMPLANT
DEVICE INFLATION ENCORE 26 (MISCELLANEOUS) IMPLANT
DEVICE LOCKING W-BIOPSY CAP (MISCELLANEOUS) IMPLANT
GUIDEWIRE HYDRA JAGWIRE .35 (WIRE) IMPLANT
GUIDEWIRE JAG HINI 025X260CM (WIRE) IMPLANT
KIT ENDO PROCEDURE PEN (KITS) ×2 IMPLANT
KIT TURNOVER KIT A (KITS) ×2 IMPLANT
LUBRICANT JELLY 4.5OZ STERILE (MISCELLANEOUS) IMPLANT
PAD ARMBOARD 7.5X6 YLW CONV (MISCELLANEOUS) ×2 IMPLANT
POSITIONER HEAD 8X9X4 ADT (SOFTGOODS) IMPLANT
SCOPE SPY DS DISPOSABLE (MISCELLANEOUS) IMPLANT
SNARE ROTATE MED OVAL 20MM (MISCELLANEOUS) IMPLANT
SNARE SHORT THROW 13M SML OVAL (MISCELLANEOUS) IMPLANT
SPHINCTEROTOME AUTOTOME .25 (MISCELLANEOUS) IMPLANT
SPHINCTEROTOME HYDRATOME 44 (MISCELLANEOUS) IMPLANT
SYSTEM CONTINUOUS INJECTION (MISCELLANEOUS) IMPLANT
TUBING INSUFFLATOR CO2MPACT (TUBING) ×2 IMPLANT
WALLSTENT METAL COVERED 10X60 (STENTS) IMPLANT
WALLSTENT METAL COVERED 10X80 (STENTS) IMPLANT
WATER STERILE IRR 1000ML POUR (IV SOLUTION) ×2 IMPLANT

## 2021-10-13 NOTE — Progress Notes (Signed)
Potassium 3.1. IV supplementation ordered. ERCP for today.

## 2021-10-13 NOTE — Progress Notes (Signed)
OT Cancellation Note  Patient Details Name: [name] MRN: [id number] DOB: 02/14/1951   Cancelled Treatment:    Reason Eval/Treat Not Completed: OT screened, no needs identified, will sign off. Patient functioning at baseline which is modified independent.  Thank you for the referral.   Limmie Patricia, OTR/L,CBIS  [phone]  10/13/2021, 8:17 AM

## 2021-10-13 NOTE — Progress Notes (Signed)
I touched base with Dr. Joesph Fillers this afternoon regarding Ms. [name].  He suggested to contact Dr. Corliss Parish. I have reached out to him.  He will review patient's records and let us know. AM labs ordered.

## 2021-10-13 NOTE — Anesthesia Postprocedure Evaluation (Signed)
Anesthesia Post Note  Patient: [name]  Procedure(s) Performed: ENDOSCOPIC RETROGRADE CHOLANGIOPANCREATOGRAPHY (ERCP) (Abdomen)  Patient location during evaluation: Phase II Anesthesia Type: General Level of consciousness: awake Pain management: pain level controlled Vital Signs Assessment: post-procedure vital signs reviewed and stable Respiratory status: spontaneous breathing and respiratory function stable Cardiovascular status: blood pressure returned to baseline and stable Postop Assessment: no headache and no apparent nausea or vomiting Anesthetic complications: no Comments: Late entry   No notable events documented.   Last Vitals:  Vitals:   10/13/21 1430 10/13/21 1454  BP: (!) 165/66 (!) 157/65  Pulse: (!) 59 (!) 59  Resp: 14 16  Temp:  36.6 C  SpO2: 100% 97%    Last Pain:  Vitals:   10/13/21 1454  TempSrc: Oral  PainSc: 0-No pain                 Windell Norfolk

## 2021-10-13 NOTE — Op Note (Signed)
[facility] Patient Name: [name] Procedure Date: 10/13/2021 11:12 AM MRN: [id number] Date of Birth: 09/07/1951 Attending MD: Hildred Laser , MD CSN: [id number] Age: 71 Admit Type: Outpatient Procedure:                ERCP Indications:              Bile duct stone(s) Providers:                Hildred Laser, MD, Rosina Lowenstein, RN, Randa Spike, Technician Referring MD:             Barton Dubois. Medicines:                General Anesthesia Complications:            No immediate complications. Estimated Blood Loss:     Estimated blood loss was minimal. Procedure:                Pre-Anesthesia Assessment:                           - Prior to the procedure, a History and Physical                            was performed, and patient medications and                            allergies were reviewed. The patient's tolerance of                            previous anesthesia was also reviewed. The risks                            and benefits of the procedure and the sedation                            options and risks were discussed with the patient.                            All questions were answered, and informed consent                            was obtained. Prior Anticoagulants: The patient has                            taken no previous anticoagulant or antiplatelet                            agents except for aspirin. ASA Grade Assessment: II                            - A patient with mild systemic disease. After  reviewing the risks and benefits, the patient was                            deemed in satisfactory condition to undergo the                            procedure.                           After obtaining informed consent, the scope was                            passed under direct vision. Throughout the                            procedure, the patient's blood pressure, pulse, and                             oxygen saturations were monitored continuously. The                            [employer] D single use                            duodenoscope was introduced through the mouth, and                            used to inject contrast into and used to inject                            contrast into the bile duct. The ERCP was                            technically difficult and complex. The patient                            tolerated the procedure well. Scope In: 12:05:19 PM Scope Out: 1:07:26 PM Total Procedure Duration: 1 hour 2 minutes 7 seconds  Findings:      The scout film was normal. The esophagus was successfully intubated       under direct vision. The scope was advanced to a normal major papilla in       the descending duodenum without detailed examination of the pharynx,       larynx and associated structures, and upper GI tract. The upper GI tract       was grossly normal. The major papilla revealed ulceration secondary to       recent sphincterotomy.. The bile duct could not be cannulated with the       Autotome sphincterotome and 035 Hydra Jagwire as well as angled wire.       Cystic duct cannulated with guidewire as well as pancreatic duct but no       contrast injected. The guidewire tip entered along the medial wall into       the duodenum. Impression:               -  Major papilla with evidence of recent                            sphincterotomy.                           - Bile duct could not be cannulated in order to                            complete the sphincterotomy and remove and stone.                           - Moderate Sedation:      Per Anesthesia Care Recommendation:           - Clear liquid diet today.                           - Continue present medications.                           - Chemistry panel and serum amylase in a.m.                           - Referral to tertiary center for ERCP. She may                             need EUS assisted procedure. Procedure Code(s):        --- Professional ---                           [phone], Endoscopic retrograde                            cholangiopancreatography (ERCP); diagnostic,                            including collection of specimen(s) by brushing or                            washing, when performed (separate procedure) Diagnosis Code(s):        --- Professional ---                           K80.50, Calculus of bile duct without cholangitis                            or cholecystitis without obstruction                           K83.8, Other specified diseases of biliary tract CPT copyright 2019 American Medical Association. All rights reserved. The codes documented in this report are preliminary and upon coder review may  be revised to meet current compliance requirements. Hildred Laser, MD Hildred Laser, MD 10/13/2021 1:44:20 PM This report has been signed electronically. Number of Addenda: 0

## 2021-10-13 NOTE — Anesthesia Preprocedure Evaluation (Signed)
Anesthesia Evaluation  Patient identified by MRN, date of birth, ID band Patient awake    Reviewed: Allergy & Precautions, H&P , NPO status , Patient's Chart, lab work & pertinent test results, reviewed documented beta blocker date and time   Airway Mallampati: II  TM Distance: >3 FB Neck ROM: full    Dental no notable dental hx. (+) Upper Dentures, Poor Dentition,    Pulmonary neg pulmonary ROS, former smoker,    Pulmonary exam normal breath sounds clear to auscultation       Cardiovascular Exercise Tolerance: Good hypertension, + CAD and + Past MI  Normal cardiovascular exam Rhythm:regular Rate:Normal  Left ventricle: The cavity size was normal. Wall thickness was  normal. Systolic function was normal. The estimated ejection  fraction was in the range of 55% to 60%. Possible akinesis of the  basalinferolateral myocardium. Left ventricular diastolic  function parameters were normal for the patient&'s age.  - Aortic valve: Mildly calcified annulus. Trileaflet.  - Mitral valve: Mildly calcified annulus. There was trivial  regurgitation.  - Atrial septum: No defect or patent foramen ovale was identified.  - Tricuspid valve: There was trivial regurgitation.  - Pulmonary arteries: Systolic pressure could not be accurately  estimated.  - Pericardium, extracardiac: There was no pericardial effusion.    Neuro/Psych PSYCHIATRIC DISORDERS Anxiety Depression CVA    GI/Hepatic Neg liver ROS, GERD  Medicated,Colitis hx   Endo/Other  negative endocrine ROS  Renal/GU negative Renal ROS  negative genitourinary   Musculoskeletal negative musculoskeletal ROS (+)   Abdominal   Peds  Hematology negative hematology ROS (+)   Anesthesia Other Findings   Reproductive/Obstetrics negative OB ROS                             Anesthesia Physical  Anesthesia Plan  ASA: 2  Anesthesia Plan:  General and General ETT   Post-op Pain Management:    Induction:   PONV Risk Score and Plan: 2 and Ondansetron  Airway Management Planned: Oral ETT  Additional Equipment:   Intra-op Plan:   Post-operative Plan:   Informed Consent: I have reviewed the patients History and Physical, chart, labs and discussed the procedure including the risks, benefits and alternatives for the proposed anesthesia with the patient or authorized representative who has indicated his/her understanding and acceptance.     Dental Advisory Given  Plan Discussed with: CRNA  Anesthesia Plan Comments:         Anesthesia Quick Evaluation

## 2021-10-13 NOTE — Transfer of Care (Signed)
Immediate Anesthesia Transfer of Care Note  Patient: [name]  Procedure(s) Performed: ENDOSCOPIC RETROGRADE CHOLANGIOPANCREATOGRAPHY (ERCP) (Abdomen)  Patient Location: PACU  Anesthesia Type:General  Level of Consciousness: sedated  Airway & Oxygen Therapy: Patient Spontanous Breathing  Post-op Assessment: Report given to RN and Post -op Vital signs reviewed and stable  Post vital signs: Reviewed and stable  Last Vitals:  Vitals Value Taken Time  BP 133/68 10/13/21 1319  Temp    Pulse 81 10/13/21 1322  Resp 30 10/13/21 1322  SpO2 99 % 10/13/21 1322  Vitals shown include unvalidated device data.  Last Pain:  Vitals:   10/13/21 1035  TempSrc: Oral  PainSc: 0-No pain      Patients Stated Pain Goal: 2 (10/13/21 1035)  Complications: No notable events documented.

## 2021-10-13 NOTE — Anesthesia Procedure Notes (Signed)
Procedure Name: Intubation Date/Time: 10/13/2021 11:53 AM Performed by: Vista Deck, CRNA Pre-anesthesia Checklist: Patient identified, Patient being monitored, Timeout performed, Emergency Drugs available and Suction available Patient Re-evaluated:Patient Re-evaluated prior to induction Oxygen Delivery Method: Circle system utilized Preoxygenation: Pre-oxygenation with 100% oxygen Induction Type: IV induction Ventilation: Mask ventilation without difficulty Laryngoscope Size: Mac and 3 Grade View: Grade I Tube type: Oral Tube size: 6.5 mm Number of attempts: 1 Airway Equipment and Method: Stylet Placement Confirmation: ETT inserted through vocal cords under direct vision, positive ETCO2 and breath sounds checked- equal and bilateral Secured at: 21 cm Tube secured with: Tape Dental Injury: Teeth and Oropharynx as per pre-operative assessment

## 2021-10-13 NOTE — Progress Notes (Signed)
PROGRESS NOTE    [name]  [id number] DOB: 12-01-1950 DOA: 10/09/2021 PCP: Carrolyn Meiers, MD    Chief Complaint  Patient presents with   Abdominal Pain    Brief Narrative:  As per H&P written by Dr. Waldron Labs on 10/09/2021  [name]  is a 71 y.o. female, with past medical history of CVA, CAD, hypertension, hyperlipidemia, functional constipation, patient presents to ED secondary to complaints of abdominal pain, nausea and vomiting, symptoms been going on for last week, she has chronic constipation as well, she mentioned symptoms to her son yesterday, who prompted her to ED, reports symptoms are worse at nighttime, he does report some cough, she denies dyspnea, fever, chills, no history of previous GI symptoms, no coffee-ground emesis, no bright red blood per rectum. -In ED CT abdomen pelvis significant for choledocholithiasis, LFTs are stable, low-grade temperature 99.2, white blood cell count of 13.6, potassium is low at 2.7, creatinine is up at 1.05, ED discussed with gastroenterology, plan for ERCP tomorrow by Dr. Laural Golden, [facility] hospitalist consulted to admit.   Assessment & Plan: 1-cholelithiasis with Choledocholithiasis -Currently afebrile -Normal LFTs; WBCs WNL. -Images suggesting obstructive stone in the distal bile duct. -Continue IV fluids, supportive care and current antibiotic therapy for now. -Patient allowed to have clear liquid diet. -GI planning for repeat ERCP later today 10/13/2021. -General surgery also consulted as patient will need cholecystectomy after stone removed from her bile duct.. -Continue analgesics and antiemetics as needed  2-Essential hypertension -Stable overall -Continue current antihypertensive agents. -Continue to follow vital signs.  3-history of CAD (coronary artery disease), native coronary artery -No chest pain, no shortness of breath -Continue telemetry monitoring. -Continue beta-blocker, continue holding aspirin  in the setting of anticipated procedures.  4-History of stroke -No deficits or new complaints -Resume aspirin for secondary prevention at time of discharge.  5-GERD/GI prophylaxis -Continue PPI.  6-chronic constipation -Resume bowel movement regimen. -Follow GI recommendations.  7-hypokalemia -In the setting of GI losses and decreased oral intake -Potassium 3.1 this morning. -Electrolytes will be repleted and will follow trend.  8-macrocytosis with B12 and folate deficiency -Patient has been started on [id number] and folic acid repletion.  DVT prophylaxis: SCDs Code Status: Full code Family Communication: Son at bedside. Disposition:   Status is: Inpatient    Consultants:  Gastroenterology service General surgery  Procedures:  See below for x-ray report ERCP: Attempted on 10/10/2021; scope fell back into the stomach resulting in loss of cannulation and inability to perform sphincterectomy.  Plan is to repeat ERCP on 10/13/2021.  Antimicrobials:  Cipro and Flagyl   Subjective: Reports nausea overnight; intermittently having right upper quadrant pain.  No chest pain, no shortness of breath, patient is afebrile.     Objective: Vitals:   10/13/21 1400 10/13/21 1415 10/13/21 1430 10/13/21 1454  BP: (!) 178/67 (!) 163/70 (!) 165/66 (!) 157/65  Pulse: 67 63 (!) 59 (!) 59  Resp: 18 17 14 16   Temp:    97.9 F (36.6 C)  TempSrc:    Oral  SpO2: 98% 100% 100% 97%  Weight:      Height:        Intake/Output Summary (Last 24 hours) at 10/13/2021 1640 Last data filed at 10/13/2021 1613 Gross per 24 hour  Intake 2214.12 ml  Output 0 ml  Net 2214.12 ml   Filed Weights   10/09/21 1222 10/13/21 1035  Weight: 47.6 kg 47.6 kg    Examination: General exam: Alert, awake, oriented x 3; no  fever, no shortness of breath and no chest pain.  Reports feeling slightly nauseated overnight; still having right upper quadrant pain intermittently. Respiratory system: Clear to auscultation.  Respiratory effort normal.  Good saturation on room air. Cardiovascular system:RRR. No murmurs, rubs, gallops. Gastrointestinal system: Abdomen is nondistended, soft and not guarding. Right upper quadrant discomfort with deep palpation reported.. No organomegaly or masses felt. Normal bowel sounds heard. Central nervous system: Alert and oriented. No focal neurological deficits. Extremities: No cyanosis, clubbing or edema. Skin: No petechiae. Psychiatry: Judgement and insight appear normal. Mood & affect appropriate.   Data Reviewed: I have personally reviewed following labs and imaging studies  CBC: Recent Labs  Lab 10/09/21 1307 10/10/21 0413 10/11/21 0346 10/13/21 0339  WBC 13.6* 12.3* 13.2* 8.7  HGB 14.4 12.3 12.6 10.8*  HCT 44.4 39.0 39.4 35.2*  MCV 108.0* 108.0* 107.9* 105.7*  PLT 233 231 230 [id number]    Basic Metabolic Panel: Recent Labs  Lab 10/09/21 1307 10/09/21 2230 10/10/21 0413 10/11/21 0346 10/13/21 0339  NA 143 139 141 138 139  K 2.7* 3.7 5.0 3.6 3.1*  CL 106 106 111 106 110  CO2 23 22 23 23 26   GLUCOSE 172* 183* 107* 179* 98  BUN 8 6* 8 12 5*  CREATININE 1.05* 0.98 1.08* 1.08* 0.81  CALCIUM 9.9 9.0 9.2 9.1 8.0*    GFR: Estimated Creatinine Clearance: 45.1 mL/min (by C-G formula based on SCr of 0.81 mg/dL).  Liver Function Tests: Recent Labs  Lab 10/09/21 1307 10/10/21 0413 10/11/21 0346 10/13/21 0339  AST 27 24 33 30  ALT 17 14 16 15   ALKPHOS 112 86 88 65  BILITOT 1.0 1.1 1.3* 1.1  PROT 8.4* 6.7 7.1 5.6*  ALBUMIN 4.3 3.3* 3.5 2.8*    CBG: No results for input(s): GLUCAP in the last 168 hours.   Recent Results (from the past 240 hour(s))  Resp Panel by RT-PCR (Flu A&B, Covid) Nasopharyngeal Swab     Status: None   Collection Time: 10/09/21  3:58 PM   Specimen: Nasopharyngeal Swab; Nasopharyngeal(NP) swabs in vial transport medium  Result Value Ref Range Status   SARS Coronavirus 2 by RT PCR NEGATIVE NEGATIVE Final    Comment:  (NOTE) SARS-CoV-2 target nucleic acids are NOT DETECTED.  The SARS-CoV-2 RNA is generally detectable in upper respiratory specimens during the acute phase of infection. The lowest concentration of SARS-CoV-2 viral copies this assay can detect is 138 copies/mL. A negative result does not preclude SARS-Cov-2 infection and should not be used as the sole basis for treatment or other patient management decisions. A negative result may occur with  improper specimen collection/handling, submission of specimen other than nasopharyngeal swab, presence of viral mutation(s) within the areas targeted by this assay, and inadequate number of viral copies(<138 copies/mL). A negative result must be combined with clinical observations, patient history, and epidemiological information. The expected result is Negative.  Fact Sheet for Patients:  EntrepreneurPulse.com.au  Fact Sheet for Healthcare Providers:  IncredibleEmployment.be  This test is no t yet approved or cleared by the [place] FDA and  has been authorized for detection and/or diagnosis of SARS-CoV-2 by FDA under an Emergency Use Authorization (EUA). This EUA will remain  in effect (meaning this test can be used) for the duration of the COVID-19 declaration under Section 564(b)(1) of the Act, 21 U.S.C.section 360bbb-3(b)(1), unless the authorization is terminated  or revoked sooner.       Influenza A by PCR NEGATIVE NEGATIVE Final  Influenza B by PCR NEGATIVE NEGATIVE Final    Comment: (NOTE) The Xpert Xpress SARS-CoV-2/FLU/RSV plus assay is intended as an aid in the diagnosis of influenza from Nasopharyngeal swab specimens and should not be used as a sole basis for treatment. Nasal washings and aspirates are unacceptable for Xpert Xpress SARS-CoV-2/FLU/RSV testing.  Fact Sheet for Patients: EntrepreneurPulse.com.au  Fact Sheet for Healthcare  Providers: IncredibleEmployment.be  This test is not yet approved or cleared by the [place] FDA and has been authorized for detection and/or diagnosis of SARS-CoV-2 by FDA under an Emergency Use Authorization (EUA). This EUA will remain in effect (meaning this test can be used) for the duration of the COVID-19 declaration under Section 564(b)(1) of the Act, 21 U.S.C. section 360bbb-3(b)(1), unless the authorization is terminated or revoked.  Performed at [facility], [address], [place], [place] [address]      Radiology Studies: DG ERCP  Result Date: 10/13/2021 CLINICAL DATA:  Choledocholithiasis. EXAM: ERCP COMPARISON:  ERCP, 10/10/2021.  CT AP, 10/09/2021 and 07/15/2014. FLUOROSCOPY TIME:  Fluoroscopy Time:  2 minutes 46 seconds Radiation Exposure Index (if provided by the fluoroscopic device): 16.7 mGy Number of Acquired Spot Images: 2 fluoroscopic cine loops, with 19 and 6 images respectively. FINDINGS: Multiple, limited oblique planar images of the RIGHT upper quadrant obtained C-arm. Images demonstrating flexible endoscopy, biliary duct cannulation, and partial retrograde cholangiogram. IMPRESSION: Fluoroscopic imaging for ERCP. For complete description of intra procedural findings, please see performing service dictation. Electronically Signed   By: Michaelle Birks M.D.   On: 10/13/2021 13:26   DG C-Arm 1-60 Min-No Report  Result Date: 10/13/2021 Fluoroscopy was utilized by the requesting physician.  No radiographic interpretation.    Scheduled Meds:  cloNIDine  0.3 mg Oral BID   cyanocobalamin  1,000 mcg Intramuscular Daily   folic acid  1 mg Oral Daily   linaclotide  72 mcg Oral Daily   metoprolol tartrate  50 mg Oral BID   pantoprazole  40 mg Oral Daily   senna-docusate  2 tablet Oral BID   Continuous Infusions:  sodium chloride 50 mL/hr at 10/13/21 1501   ciprofloxacin 400 mg (10/13/21 0501)   metronidazole 500 mg (10/13/21 0851)   potassium  chloride 10 mEq (10/13/21 1613)   sodium chloride       LOS: 4 days    Barton Dubois, MD [facility]   To contact the attending provider between 7A-7P or the covering provider during after hours 7P-7A, please log into the web site [email or web address] and access using universal [place] password for that web site. If you do not have the password, please call the hospital operator.  10/13/2021, 4:40 PM

## 2021-10-13 NOTE — Progress Notes (Signed)
Brief ERCP note.  Ampulla of Vater I am not checking with evidence of prior sphincterotomy; mucosal edema. Cystic duct and pancreatic duct cannulated with a 035 Hydra Jagwire. Unable to selectively cannulate bile duct with 035 Hydra Jagwire as well as angled wire. Patient tolerated the procedure well.

## 2021-10-13 NOTE — Progress Notes (Signed)
Subjective:  Patient says she has mild epigastric and right upper quadrant pain.  Pain is decreased since admission.  No nausea or vomiting this morning.  Patient also reports transient chest pain yesterday lasting for few seconds.  She denies shortness of breath.  Current Medications:  Current Facility-Administered Medications:    0.9 %  sodium chloride infusion, , Intravenous, Continuous, Paxton Binns U, MD, Stopped at 10/12/21 1658   0.9 %  sodium chloride infusion, , Intravenous, Continuous, Jessup Ogas, Mechele Dawley, MD   [MAR Hold] acetaminophen (TYLENOL) tablet 650 mg, 650 mg, Oral, Q6H PRN **OR** [MAR Hold] acetaminophen (TYLENOL) suppository 650 mg, 650 mg, Rectal, Q6H PRN, Elmor Kost U, MD   [COMPLETED] 6 CHG cloth bath night before surgery, , , Once **AND** [COMPLETED] 6 CHG cloth bath AM of surgery, , , Once **AND** [COMPLETED] Chlorhexidine Gluconate Cloth 2 % PADS 6 each, 6 each, Topical, Once, 6 each at 10/12/21 2015 **AND** Chlorhexidine Gluconate Cloth 2 % PADS 6 each, 6 each, Topical, Once, Aviva Signs, MD   [facility] Hold] ciprofloxacin (CIPRO) IVPB 400 mg, 400 mg, Intravenous, Q12H, Aydin Cavalieri U, MD, Last Rate: 200 mL/hr at 10/13/21 0501, 400 mg at 10/13/21 0501   [MAR Hold] cloNIDine (CATAPRES) tablet 0.3 mg, 0.3 mg, Oral, BID, Orva Riles U, MD, 0.3 mg at 10/13/21 0853   [MAR Hold] cyanocobalamin ((VITAMIN B-12)) injection 1,000 mcg, 1,000 mcg, Intramuscular, Daily, Barton Dubois, MD, 1,000 mcg at 10/13/21 1962   [MAR Hold] folic acid (FOLVITE) tablet 1 mg, 1 mg, Oral, Daily, Barton Dubois, MD, 1 mg at 10/13/21 0854   glucagon (human recombinant) (GLUCAGEN) 1 MG injection, , , ,    [MAR Hold] HYDROcodone-acetaminophen (NORCO/VICODIN) 5-325 MG per tablet 1-2 tablet, 1-2 tablet, Oral, Q4H PRN, Kohan Azizi, Mechele Dawley, MD, 1 tablet at 10/12/21 0525   lactated ringers infusion, , Intravenous, Continuous, Kiel, Coralie Keens, MD, Last Rate: 10 mL/hr at 10/13/21 1103, Continued from Pre-op  at 10/13/21 1103   [MAR Hold] linaclotide (LINZESS) capsule 72 mcg, 72 mcg, Oral, Daily, Yash Cacciola U, MD, 72 mcg at 10/13/21 0906   [MAR Hold] metoprolol tartrate (LOPRESSOR) tablet 50 mg, 50 mg, Oral, BID, Moranda Billiot U, MD, 50 mg at 10/13/21 0853   [MAR Hold] metroNIDAZOLE (FLAGYL) IVPB 500 mg, 500 mg, Intravenous, Q8H, Cayla Wiegand U, MD, Last Rate: 100 mL/hr at 10/13/21 0851, 500 mg at 10/13/21 0851   [MAR Hold] morphine 2 MG/ML injection 1 mg, 1 mg, Intravenous, Q2H PRN, Gay Moncivais U, MD   [MAR Hold] pantoprazole (PROTONIX) EC tablet 40 mg, 40 mg, Oral, Daily, Azalynn Maxim U, MD, 40 mg at 10/13/21 0852   [MAR Hold] phenol (CHLORASEPTIC) mouth spray 1 spray, 1 spray, Mouth/Throat, PRN, Barton Dubois, MD   [facility] Hold] potassium chloride 10 mEq in 100 mL IVPB, 10 mEq, Intravenous, Q1 Hr x 4, Boone, Anna W, NP, Last Rate: 100 mL/hr at 10/13/21 1014, 10 mEq at 10/13/21 1014   [MAR Hold] senna-docusate (Senokot-S) tablet 2 tablet, 2 tablet, Oral, BID, Jadin Kagel U, MD, 2 tablet at 10/13/21 0852   sodium chloride 0.9 % infusion, , , ,    Objective: Blood pressure (!) 128/58, pulse 65, temperature 98.1 F (36.7 C), temperature source Oral, resp. rate 19, height 5' (1.524 m), weight 47.6 kg, SpO2 97 %. Patient is alert and in no acute distress. No neck masses or thyromegaly noted. Cardiac exam with regular rhythm normal S1 and S2. No murmur or gallop noted. Lungs are clear to  auscultation. Abdomen is symmetrical and soft with mild tenderness in the epigastrium and right upper quadrant.  She got some right upper quadrant.  No organomegaly or masses. No LE edema or clubbing noted.  Labs/studies Results:   CBC Latest Ref Rng & Units 10/13/2021 10/11/2021 10/10/2021  WBC 4.0 - 10.5 K/uL 8.7 13.2(H) 12.3(H)  Hemoglobin 12.0 - 15.0 g/dL 10.8(L) 12.6 12.3  Hematocrit 36.0 - 46.0 % 35.2(L) 39.4 39.0  Platelets 150 - 400 K/uL 190 230 231    CMP Latest Ref Rng & Units 10/13/2021  10/11/2021 10/10/2021  Glucose 70 - 99 mg/dL 98 179(H) 107(H)  BUN 8 - 23 mg/dL 5(L) 12 8  Creatinine 0.44 - 1.00 mg/dL 0.81 1.08(H) 1.08(H)  Sodium 135 - 145 mmol/L 139 138 141  Potassium 3.5 - 5.1 mmol/L 3.1(L) 3.6 5.0  Chloride 98 - 111 mmol/L 110 106 111  CO2 22 - 32 mmol/L 26 23 23   Calcium 8.9 - 10.3 mg/dL 8.0(L) 9.1 9.2  Total Protein 6.5 - 8.1 g/dL 5.6(L) 7.1 6.7  Total Bilirubin 0.3 - 1.2 mg/dL 1.1 1.3(H) 1.1  Alkaline Phos 38 - 126 U/L 65 88 86  AST 15 - 41 U/L 30 33 24  ALT 0 - 44 U/L 15 16 14     Hepatic Function Latest Ref Rng & Units 10/13/2021 10/11/2021 10/10/2021  Total Protein 6.5 - 8.1 g/dL 5.6(L) 7.1 6.7  Albumin 3.5 - 5.0 g/dL 2.8(L) 3.5 3.3(L)  AST 15 - 41 U/L 30 33 24  ALT 0 - 44 U/L 15 16 14   Alk Phosphatase 38 - 126 U/L 65 88 86  Total Bilirubin 0.3 - 1.2 mg/dL 1.1 1.3(H) 1.1    Serum amylase 64.  Assessment:  #1.  Choledocholithiasis.  Patient underwent ERCP with sphincterotomy 3 days ago.  Sphincterotomy was incomplete.  Cannulation was lost because scope fell back to stomach and bile duct could not be cannulated again.  She remains with intermittent right upper quadrant at epigastric pain.  Transaminases remain normal.  He had mild bump in serum amylase 217 post ERCP but is now normal.  WBC is normal.  #2.  Cholelithiasis.  Patient will undergo cholecystectomy once bile duct is cleared of stone.  #3.  Weight loss.  Weight loss appears to be multifactorial.  May be due to the refractory disease as well as B12 deficiency.  At  Plan:  Proceed with ERCP with extension of sphincterotomy and stone extraction. Did talk with her sons over the weekend there agreeable to proceed with repeat ERCP.

## 2021-10-14 ENCOUNTER — Encounter (HOSPITAL_COMMUNITY): Payer: Self-pay | Admitting: Internal Medicine

## 2021-10-14 ENCOUNTER — Inpatient Hospital Stay (HOSPITAL_COMMUNITY): Payer: Medicare Other

## 2021-10-14 DIAGNOSIS — K8050 Calculus of bile duct without cholangitis or cholecystitis without obstruction: Secondary | ICD-10-CM

## 2021-10-14 LAB — CBC
HCT: 32 % — ABNORMAL LOW (ref 36.0–46.0)
Hemoglobin: 10 g/dL — ABNORMAL LOW (ref 12.0–15.0)
MCH: 33.2 pg (ref 26.0–34.0)
MCHC: 31.3 g/dL (ref 30.0–36.0)
MCV: 106.3 fL — ABNORMAL HIGH (ref 80.0–100.0)
Platelets: 167 10*3/uL (ref 150–400)
RBC: 3.01 MIL/uL — ABNORMAL LOW (ref 3.87–5.11)
RDW: 12.8 % (ref 11.5–15.5)
WBC: 7.5 10*3/uL (ref 4.0–10.5)
nRBC: 0 % (ref 0.0–0.2)

## 2021-10-14 LAB — COMPREHENSIVE METABOLIC PANEL
ALT: 13 U/L (ref 0–44)
AST: 22 U/L (ref 15–41)
Albumin: 2.6 g/dL — ABNORMAL LOW (ref 3.5–5.0)
Alkaline Phosphatase: 62 U/L (ref 38–126)
Anion gap: 6 (ref 5–15)
BUN: 6 mg/dL — ABNORMAL LOW (ref 8–23)
CO2: 24 mmol/L (ref 22–32)
Calcium: 8.1 mg/dL — ABNORMAL LOW (ref 8.9–10.3)
Chloride: 111 mmol/L (ref 98–111)
Creatinine, Ser: 0.79 mg/dL (ref 0.44–1.00)
GFR, Estimated: >60 mL/min (ref >60)
Glucose, Bld: 97 mg/dL (ref 70–99)
Potassium: 3.6 mmol/L (ref 3.5–5.1)
Sodium: 141 mmol/L (ref 135–145)
Total Bilirubin: 0.8 mg/dL (ref 0.3–1.2)
Total Protein: 5 g/dL — ABNORMAL LOW (ref 6.5–8.1)

## 2021-10-14 LAB — AMYLASE: Amylase: 65 U/L (ref 28–100)

## 2021-10-14 LAB — SURGICAL PCR SCREEN

## 2021-10-14 MED ORDER — ENSURE ENLIVE PO LIQD
237.0000 mL | Freq: Two times a day (BID) | ORAL | Status: DC
  Administered 2021-10-14: 12:00:00 237 mL via ORAL

## 2021-10-14 MED ORDER — CHLORHEXIDINE GLUCONATE CLOTH 2 % EX PADS
6.0000 | MEDICATED_PAD | Freq: Once | CUTANEOUS | Status: AC
  Administered 2021-10-14: 23:00:00 6 via TOPICAL

## 2021-10-14 MED ORDER — POLYETHYLENE GLYCOL 3350 17 G PO PACK
17.0000 g | PACK | Freq: Two times a day (BID) | ORAL | Status: DC
  Filled 2021-10-14 (×2): qty 1

## 2021-10-14 MED ORDER — GADOBUTROL 1 MMOL/ML IV SOLN
5.0000 mL | Freq: Once | INTRAVENOUS | Status: AC | PRN
  Administered 2021-10-14: 09:00:00 5 mL via INTRAVENOUS

## 2021-10-14 MED ORDER — LINACLOTIDE 145 MCG PO CAPS
145.0000 ug | ORAL_CAPSULE | Freq: Every day | ORAL | Status: DC
Start: 2021-10-14 — End: 2021-10-16
  Administered 2021-10-14 – 2021-10-16 (×2): 145 ug via ORAL
  Filled 2021-10-14 (×2): qty 1

## 2021-10-14 MED ORDER — CHLORHEXIDINE GLUCONATE CLOTH 2 % EX PADS
6.0000 | MEDICATED_PAD | Freq: Once | CUTANEOUS | Status: AC
  Administered 2021-10-14: 17:00:00 6 via TOPICAL

## 2021-10-14 NOTE — Progress Notes (Signed)
MRCP results reviewed.  The common bile duct has decreased in size and may have a very small 3 mm filling defect distally.  Given these findings, we will proceed with a laparoscopic cholecystectomy with cholangiograms to hopefully flush out any remaining common bile duct sludge/stone.  The risks and benefits of the procedure including bleeding, infection, hepatobiliary injury, the possibility of an open procedure were explained to the patient, who gave informed consent.  Patient is scheduled for tomorrow.

## 2021-10-14 NOTE — H&P (View-Only) (Signed)
MRCP results reviewed.  The common bile duct has decreased in size and may have a very small 3 mm filling defect distally.  Given these findings, we will proceed with a laparoscopic cholecystectomy with cholangiograms to hopefully flush out any remaining common bile duct sludge/stone.  The risks and benefits of the procedure including bleeding, infection, hepatobiliary injury, the possibility of an open procedure were explained to the patient, who gave informed consent.  Patient is scheduled for tomorrow. °

## 2021-10-14 NOTE — Progress Notes (Signed)
Subjective: Still having abdominal pain, generalized, but primarily right sided. Nausea without vomiting. Tolerating clear liquids ok but doesn't like it. Passing gas but no BM in the last couple days, but states "there isn't anything in there". Son at bedside reports she had a BM a few days ago. Last documented BM was on 1/21.   Objective: Vital signs in last 24 hours: Temp:  [97.9 F (36.6 C)-98.7 F (37.1 C)] 98 F (36.7 C) (01/24 0430) Pulse Rate:  [59-85] 68 (01/24 0430) Resp:  [14-25] 20 (01/24 0430) BP: (108-178)/(45-79) 128/62 (01/24 0430) SpO2:  [96 %-100 %] 100 % (01/24 0430) Weight:  [47.6 kg] 47.6 kg (01/23 1035) Last BM Date: 10/10/21 General:   Alert and oriented, pleasant, NAD Head:  Normocephalic and atraumatic. Eyes:  No icterus, sclera clear. Conjuctiva pink.  Abdomen:  Bowel sounds present, soft, non-distended, diffuse tenderness to palpation, most pronounced in RUQ and epigastric region without rebound or guarding. No HSM or hernias noted. No rebound or guarding. No masses appreciated  Msk:  Symmetrical without gross deformities. Normal posture. Extremities:  Without edema. Neurologic:  Alert and  oriented x4;  grossly normal neurologically. Skin:  Warm and dry, intact without significant lesions.  Psych:  Normal mood and affect.  Intake/Output from previous day: 01/23 0701 - 01/24 0700 In: 2561.1 [P.O.:240; I.V.:1798.8; IV Piggyback:522.4] Out: 0  Intake/Output this shift: No intake/output data recorded.  Lab Results: Recent Labs    10/13/21 0339 10/14/21 0416  WBC 8.7 7.5  HGB 10.8* 10.0*  HCT 35.2* 32.0*  PLT 190 167   BMET Recent Labs    10/13/21 0339 10/14/21 0416  NA 139 141  K 3.1* 3.6  CL 110 111  CO2 26 24  GLUCOSE 98 97  BUN 5* 6*  CREATININE 0.81 0.79  CALCIUM 8.0* 8.1*   LFT Recent Labs    10/13/21 0339 10/14/21 0416  PROT 5.6* 5.0*  ALBUMIN 2.8* 2.6*  AST 30 22  ALT 15 13  ALKPHOS 65 62  BILITOT 1.1 0.8    Studies/Results: DG ERCP  Result Date: 10/13/2021 CLINICAL DATA:  Choledocholithiasis. EXAM: ERCP COMPARISON:  ERCP, 10/10/2021.  CT AP, 10/09/2021 and 07/15/2014. FLUOROSCOPY TIME:  Fluoroscopy Time:  2 minutes 46 seconds Radiation Exposure Index (if provided by the fluoroscopic device): 16.7 mGy Number of Acquired Spot Images: 2 fluoroscopic cine loops, with 19 and 6 images respectively. FINDINGS: Multiple, limited oblique planar images of the RIGHT upper quadrant obtained C-arm. Images demonstrating flexible endoscopy, biliary duct cannulation, and partial retrograde cholangiogram. IMPRESSION: Fluoroscopic imaging for ERCP. For complete description of intra procedural findings, please see performing service dictation. Electronically Signed   By: Roanna Banning M.D.   On: 10/13/2021 13:26   DG C-Arm 1-60 Min-No Report  Result Date: 10/13/2021 Fluoroscopy was utilized by the requesting physician.  No radiographic interpretation.    Assessment: 71 y.o. year old female with past GI history of chronic constipation, admitted with cholelithiasis and choledocholithiasis, concern for evolving cholangitis with leukocytosis. Currently on ciprofloxacin and flagyl with leukocytosis resolved. She is status post 2 ERCP attempts for CBD stone extraction, but both have been unsuccessful. Biliary sphincterotomy was performed on 1/20, but incomplete as cannulation was lost because scope fell back to stomach and bile duct could not be cannulated again.  Repeat ERCP 1/23 with evidence of recent sphincterotomy, but unable to cannulate bile duct. Dr. Karilyn Cota has discussed the case with Dr. Christella Hartigan and Dr. Barbarann Ehlers in [place] who are unable to  perform repeat ERCP. Ultimately, patient would need ERCP at tertiary care center. May need EUS assisted procedure.  Encouragingly, she is not obstructed as her LFTs and bilirubin are normal.  Amylase also normal this morning. She continues with abdominal pain, most pronounced in RUQ  and epigastric area, and nausea without vomiting. Planning on MRI/MRCP today to see if CBD stone may have passed. Further recommendations to follow. She will ultimately need cholecystectomy, timing to be determined.   Chronic Constipation: Ongoing issue. Was improving with MiraLAX and Linzess. No documented BM since 1/21, but she has only had little clear liquid intake. Continues to pass gas. Bowel sounds are present on exam. Will increase Linzess to 145 mcg daily. Notably, last colonoscopy was in 2015 with inadequate prep. She would benefit from outpatient colonoscopy and extended prep.   Weight Loss: Likely related to decreased appetite in setting of abdominal pain from biliary etiology, but other sources need to be ruled out if she continues with weight loss.   Plan: MRI/MRCP today.  Further recommendations to follow. Advance to full liquids as tolerated.  Appreciate general surgery help.  Timing of cholecystectomy to be determined. Continue empiric antibiotics. Increase Linzess to 145 mcg daily. Continue to monitor LFTs. Needs outpatient colonoscopy with extended prep.      LOS: 5 days    10/14/2021, 9:11 AM   Ermalinda Memos, [facility]

## 2021-10-14 NOTE — Progress Notes (Signed)
Discussed with Dr. Karilyn Cota.  We will get follow-up MRCP to see whether or not the common bile duct stone is still present.  Further management is pending those results.

## 2021-10-14 NOTE — Progress Notes (Signed)
PROGRESS NOTE    [name]  [id number] DOB: 12/24/1950 DOA: 10/09/2021 PCP: Carrolyn Meiers, MD    Chief Complaint  Patient presents with   Abdominal Pain    Brief Narrative:  As per H&P written by Dr. Waldron Labs on 10/09/2021  [name]  is a 71 y.o. female, with past medical history of CVA, CAD, hypertension, hyperlipidemia, functional constipation, patient presents to ED secondary to complaints of abdominal pain, nausea and vomiting, symptoms been going on for last week, she has chronic constipation as well, she mentioned symptoms to her son yesterday, who prompted her to ED, reports symptoms are worse at nighttime, he does report some cough, she denies dyspnea, fever, chills, no history of previous GI symptoms, no coffee-ground emesis, no bright red blood per rectum. -In ED CT abdomen pelvis significant for choledocholithiasis, LFTs are stable, low-grade temperature 99.2, white blood cell count of 13.6, potassium is low at 2.7, creatinine is up at 1.05, ED discussed with gastroenterology, plan for ERCP tomorrow by Dr. Laural Golden, [facility]ist consulted to admit.   Assessment & Plan: 1-cholelithiasis with Choledocholithiasis -Reports no nausea or vomiting; still having mild right upper quadrant discomfort. -Normal LFTs and normal WBCs; patient is afebrile. -Continue IV fluids, supportive care and current antibiotic therapy. -Patient allowed to have clear liquid diet per GI and general surgery, will follow recommendations for further diet advance.. -GI repeated ERCP on 10/13/2021, without success. -after discussing with general surgery, plan is for MRCP (in case stone has passed on it's own now) -definitive plan is for cholecystectomy after stone removed from her bile duct is confirmed. -Continue analgesics and antiemetics as needed; patient reported to be hungry (will like creamy soup).  2-Essential hypertension -Stable overall -Continue current  antihypertensive agents, while holding Norvasc in the setting of soft BP. -Continue to follow vital signs.  3-history of CAD (coronary artery disease), native coronary artery -No chest pain, no shortness of breath -Continue telemetry monitoring. -Continue beta-blocker, continue holding aspirin in the setting of anticipated procedures.  4-History of stroke -No deficits or new complaints -Resume aspirin for secondary prevention at time of discharge.  5-GERD/GI prophylaxis -Continue PPI.  6-chronic constipation -Resume bowel movement regimen. -Follow GI recommendations.  7-hypokalemia -In the setting of GI losses and decreased oral intake -Potassium 3.6 this morning. -Continue to follow electrolytes trend and further replete as needed.  8-macrocytosis with B12 and folate deficiency -Patient has been started on [id number] and folic acid repletion.  DVT prophylaxis: SCDs Code Status: Full code Family Communication: Son at bedside. Disposition:   Status is: Inpatient    Consultants:  Gastroenterology service General surgery  Procedures:  See below for x-ray report ERCP: Attempted on 10/10/2021; scope fell back into the stomach resulting in loss of cannulation and inability to perform sphincterectomy.  ERCP was repeated without success on 10/13/21. MRCP: follow results.  Antimicrobials:  Cipro and Flagyl   Subjective: Afebrile, no chest pain, no nausea, no vomiting.  Patient reports no shortness of breath.  Still experiencing intermittent right upper quadrant discomfort.  She is hungry and asking for at least creamy soups.   Objective: Vitals:   10/13/21 1829 10/13/21 2041 10/13/21 2135 10/14/21 0430  BP: 137/71 119/79 (!) 108/45 128/62  Pulse: 76 85 84 68  Resp: 18 19  20   Temp: 98.2 F (36.8 C) 98 F (36.7 C)  98 F (36.7 C)  TempSrc: Oral Oral  Oral  SpO2: 96% 96%  100%  Weight:  Height:        Intake/Output Summary (Last 24 hours) at 10/14/2021 0814 Last  data filed at 10/14/2021 0300 Gross per 24 hour  Intake 2561.11 ml  Output 0 ml  Net 2561.11 ml   Filed Weights   10/09/21 1222 10/13/21 1035  Weight: 47.6 kg 47.6 kg    Examination: General exam: Alert, awake, oriented x 3; in good spirits; reports no nausea or vomiting.  Still intermittent right upper quadrant pain reported. Respiratory system: Clear to auscultation. Respiratory effort normal.  Good saturation on room air.  No requiring oxygen supplementation. Cardiovascular system:RRR. No murmurs, rubs, gallops.  No JVD. Gastrointestinal system: Abdomen is nondistended, soft and mildly tender to deep palpation in her right upper quadrant area. No organomegaly or masses felt. Normal bowel sounds heard. Central nervous system: Alert and oriented. No focal neurological deficits. Extremities: No cyanosis or clubbing. Skin: No rashes, no petechiae. Psychiatry: Judgement and insight appear normal. Mood & affect appropriate.    Data Reviewed: I have personally reviewed following labs and imaging studies  CBC: Recent Labs  Lab 10/09/21 1307 10/10/21 0413 10/11/21 0346 10/13/21 0339 10/14/21 0416  WBC 13.6* 12.3* 13.2* 8.7 7.5  HGB 14.4 12.3 12.6 10.8* 10.0*  HCT 44.4 39.0 39.4 35.2* 32.0*  MCV 108.0* 108.0* 107.9* 105.7* 106.3*  PLT 233 231 230 190 [id number]    Basic Metabolic Panel: Recent Labs  Lab 10/09/21 2230 10/10/21 0413 10/11/21 0346 10/13/21 0339 10/14/21 0416  NA 139 141 138 139 141  K 3.7 5.0 3.6 3.1* 3.6  CL 106 111 106 110 111  CO2 22 23 23 26 24   GLUCOSE 183* 107* 179* 98 97  BUN 6* 8 12 5* 6*  CREATININE 0.98 1.08* 1.08* 0.81 0.79  CALCIUM 9.0 9.2 9.1 8.0* 8.1*    GFR: Estimated Creatinine Clearance: 45.7 mL/min (by C-G formula based on SCr of 0.79 mg/dL).  Liver Function Tests: Recent Labs  Lab 10/09/21 1307 10/10/21 0413 10/11/21 0346 10/13/21 0339 10/14/21 0416  AST 27 24 33 30 22  ALT 17 14 16 15 13   ALKPHOS 112 86 88 65 62  BILITOT 1.0  1.1 1.3* 1.1 0.8  PROT 8.4* 6.7 7.1 5.6* 5.0*  ALBUMIN 4.3 3.3* 3.5 2.8* 2.6*    CBG: No results for input(s): GLUCAP in the last 168 hours.   Recent Results (from the past 240 hour(s))  Resp Panel by RT-PCR (Flu A&B, Covid) Nasopharyngeal Swab     Status: None   Collection Time: 10/09/21  3:58 PM   Specimen: Nasopharyngeal Swab; Nasopharyngeal(NP) swabs in vial transport medium  Result Value Ref Range Status   SARS Coronavirus 2 by RT PCR NEGATIVE NEGATIVE Final    Comment: (NOTE) SARS-CoV-2 target nucleic acids are NOT DETECTED.  The SARS-CoV-2 RNA is generally detectable in upper respiratory specimens during the acute phase of infection. The lowest concentration of SARS-CoV-2 viral copies this assay can detect is 138 copies/mL. A negative result does not preclude SARS-Cov-2 infection and should not be used as the sole basis for treatment or other patient management decisions. A negative result may occur with  improper specimen collection/handling, submission of specimen other than nasopharyngeal swab, presence of viral mutation(s) within the areas targeted by this assay, and inadequate number of viral copies(<138 copies/mL). A negative result must be combined with clinical observations, patient history, and epidemiological information. The expected result is Negative.  Fact Sheet for Patients:  EntrepreneurPulse.com.au  Fact Sheet for Healthcare Providers:  IncredibleEmployment.be  This  test is no t yet approved or cleared by the [place] and  has been authorized for detection and/or diagnosis of SARS-CoV-2 by FDA under an Emergency Use Authorization (EUA). This EUA will remain  in effect (meaning this test can be used) for the duration of the COVID-19 declaration under Section 564(b)(1) of the Act, 21 U.S.C.section 360bbb-3(b)(1), unless the authorization is terminated  or revoked sooner.       Influenza A by PCR NEGATIVE  NEGATIVE Final   Influenza B by PCR NEGATIVE NEGATIVE Final    Comment: (NOTE) The Xpert Xpress SARS-CoV-2/FLU/RSV plus assay is intended as an aid in the diagnosis of influenza from Nasopharyngeal swab specimens and should not be used as a sole basis for treatment. Nasal washings and aspirates are unacceptable for Xpert Xpress SARS-CoV-2/FLU/RSV testing.  Fact Sheet for Patients: EntrepreneurPulse.com.au  Fact Sheet for Healthcare Providers: IncredibleEmployment.be  This test is not yet approved or cleared by the [place] FDA and has been authorized for detection and/or diagnosis of SARS-CoV-2 by FDA under an Emergency Use Authorization (EUA). This EUA will remain in effect (meaning this test can be used) for the duration of the COVID-19 declaration under Section 564(b)(1) of the Act, 21 U.S.C. section 360bbb-3(b)(1), unless the authorization is terminated or revoked.  Performed at [facility], [address] [place], [place] [address]      Radiology Studies: DG ERCP  Result Date: 10/13/2021 CLINICAL DATA:  Choledocholithiasis. EXAM: ERCP COMPARISON:  ERCP, 10/10/2021.  CT AP, 10/09/2021 and 07/15/2014. FLUOROSCOPY TIME:  Fluoroscopy Time:  2 minutes 46 seconds Radiation Exposure Index (if provided by the fluoroscopic device): 16.7 mGy Number of Acquired Spot Images: 2 fluoroscopic cine loops, with 19 and 6 images respectively. FINDINGS: Multiple, limited oblique planar images of the RIGHT upper quadrant obtained C-arm. Images demonstrating flexible endoscopy, biliary duct cannulation, and partial retrograde cholangiogram. IMPRESSION: Fluoroscopic imaging for ERCP. For complete description of intra procedural findings, please see performing service dictation. Electronically Signed   By: Michaelle Birks M.D.   On: 10/13/2021 13:26   DG C-Arm 1-60 Min-No Report  Result Date: 10/13/2021 Fluoroscopy was utilized by the requesting physician.  No  radiographic interpretation.    Scheduled Meds:  cloNIDine  0.3 mg Oral BID   cyanocobalamin  1,000 mcg Intramuscular Daily   feeding supplement  237 mL Oral BID BM   folic acid  1 mg Oral Daily   linaclotide  72 mcg Oral Daily   metoprolol tartrate  50 mg Oral BID   pantoprazole  40 mg Oral Daily   senna-docusate  2 tablet Oral BID   Continuous Infusions:  sodium chloride 50 mL/hr at 10/14/21 0334   ciprofloxacin 400 mg (10/14/21 0537)   metronidazole 500 mg (10/14/21 0107)     LOS: 5 days    Barton Dubois, MD [facility]   To contact the attending provider between 7A-7P or the covering provider during after hours 7P-7A, please log into the web site [email or web address] and access using universal [place] password for that web site. If you do not have the password, please call the hospital operator.  10/14/2021, 8:14 AM

## 2021-10-15 ENCOUNTER — Other Ambulatory Visit: Payer: Self-pay

## 2021-10-15 ENCOUNTER — Inpatient Hospital Stay (HOSPITAL_COMMUNITY): Payer: Medicare Other | Admitting: Certified Registered Nurse Anesthetist

## 2021-10-15 ENCOUNTER — Encounter (HOSPITAL_COMMUNITY)
Admission: EM | Disposition: A | Discharge status: Home / Self Care | Payer: Self-pay | Source: Home / Self Care | Attending: Internal Medicine

## 2021-10-15 ENCOUNTER — Inpatient Hospital Stay (HOSPITAL_COMMUNITY): Payer: Medicare Other

## 2021-10-15 ENCOUNTER — Encounter (HOSPITAL_COMMUNITY): Payer: Self-pay | Admitting: Internal Medicine

## 2021-10-15 DIAGNOSIS — K8001 Calculus of gallbladder with acute cholecystitis with obstruction: Secondary | ICD-10-CM

## 2021-10-15 DIAGNOSIS — E876 Hypokalemia: Secondary | ICD-10-CM

## 2021-10-15 HISTORY — PX: CHOLECYSTECTOMY: SHX55

## 2021-10-15 LAB — SURGICAL PCR SCREEN

## 2021-10-15 SURGERY — LAPAROSCOPIC CHOLECYSTECTOMY WITH INTRAOPERATIVE CHOLANGIOGRAM
Anesthesia: General | Site: Abdomen

## 2021-10-15 MED ORDER — FENTANYL CITRATE PF 50 MCG/ML IJ SOSY: 25.0000 ug | PREFILLED_SYRINGE | INTRAMUSCULAR | Status: DC | PRN

## 2021-10-15 MED ORDER — ONDANSETRON HCL 4 MG/2ML IJ SOLN
INTRAMUSCULAR | Status: DC | PRN
  Administered 2021-10-15: 4 mg via INTRAVENOUS

## 2021-10-15 MED ORDER — SUGAMMADEX SODIUM 200 MG/2ML IV SOLN
INTRAVENOUS | Status: DC | PRN
Start: 2021-10-15 — End: 2021-10-15
  Administered 2021-10-15: 100 mg via INTRAVENOUS

## 2021-10-15 MED ORDER — LACTATED RINGERS IV SOLN: INTRAVENOUS | Status: DC

## 2021-10-15 MED ORDER — FENTANYL CITRATE (PF) 250 MCG/5ML IJ SOLN
INTRAMUSCULAR | Status: DC | PRN
  Administered 2021-10-15: 25 ug via INTRAVENOUS
  Administered 2021-10-15 (×3): 50 ug via INTRAVENOUS
  Administered 2021-10-15: 25 ug via INTRAVENOUS
  Administered 2021-10-15: 50 ug via INTRAVENOUS

## 2021-10-15 MED ORDER — SODIUM CHLORIDE 0.9 % IR SOLN
Status: DC | PRN
  Administered 2021-10-15: 3000 mL

## 2021-10-15 MED ORDER — ROCURONIUM BROMIDE 10 MG/ML (PF) SYRINGE
PREFILLED_SYRINGE | INTRAVENOUS | Status: DC | PRN
  Administered 2021-10-15: 40 mg via INTRAVENOUS

## 2021-10-15 MED ORDER — ONDANSETRON HCL 4 MG/2ML IJ SOLN
4.0000 mg | Freq: Four times a day (QID) | INTRAMUSCULAR | Status: DC | PRN
  Administered 2021-10-15: 15:00:00 4 mg via INTRAVENOUS
  Filled 2021-10-15: qty 2

## 2021-10-15 MED ORDER — DEXAMETHASONE SODIUM PHOSPHATE 10 MG/ML IJ SOLN
INTRAMUSCULAR | Status: DC | PRN
Start: 2021-10-15 — End: 2021-10-15
  Administered 2021-10-15: 10 mg via INTRAVENOUS

## 2021-10-15 MED ORDER — HEMOSTATIC AGENTS (NO CHARGE) OPTIME
TOPICAL | Status: DC | PRN
  Administered 2021-10-15: 1 via TOPICAL

## 2021-10-15 MED ORDER — BUPIVACAINE LIPOSOME 1.3 % IJ SUSP
INTRAMUSCULAR | Status: AC
  Filled 2021-10-15: qty 20

## 2021-10-15 MED ORDER — ONDANSETRON HCL 4 MG/2ML IJ SOLN: 4.0000 mg | Freq: Once | INTRAMUSCULAR | Status: DC | PRN

## 2021-10-15 MED ORDER — PROPOFOL 10 MG/ML IV BOLUS
INTRAVENOUS | Status: DC | PRN
  Administered 2021-10-15: 140 mg via INTRAVENOUS

## 2021-10-15 MED ORDER — DEXAMETHASONE SODIUM PHOSPHATE 10 MG/ML IJ SOLN
INTRAMUSCULAR | Status: AC
  Filled 2021-10-15: qty 1

## 2021-10-15 MED ORDER — ORAL CARE MOUTH RINSE: 15.0000 mL | Freq: Once | OROMUCOSAL | Status: AC

## 2021-10-15 MED ORDER — LIDOCAINE HCL (PF) 2 % IJ SOLN
INTRAMUSCULAR | Status: AC
  Filled 2021-10-15: qty 5

## 2021-10-15 MED ORDER — FENTANYL CITRATE (PF) 250 MCG/5ML IJ SOLN
INTRAMUSCULAR | Status: AC
  Filled 2021-10-15: qty 5

## 2021-10-15 MED ORDER — PHENYLEPHRINE HCL (PRESSORS) 10 MG/ML IV SOLN
INTRAVENOUS | Status: DC | PRN
  Administered 2021-10-15 (×2): 80 ug via INTRAVENOUS

## 2021-10-15 MED ORDER — CHLORHEXIDINE GLUCONATE 0.12 % MT SOLN
15.0000 mL | Freq: Once | OROMUCOSAL | Status: AC
  Administered 2021-10-15: 11:00:00 15 mL via OROMUCOSAL

## 2021-10-15 MED ORDER — SUCCINYLCHOLINE CHLORIDE 200 MG/10ML IV SOSY
PREFILLED_SYRINGE | INTRAVENOUS | Status: AC
  Filled 2021-10-15: qty 10

## 2021-10-15 MED ORDER — LABETALOL HCL 5 MG/ML IV SOLN
INTRAVENOUS | Status: AC
  Filled 2021-10-15: qty 4

## 2021-10-15 MED ORDER — ROCURONIUM BROMIDE 10 MG/ML (PF) SYRINGE
PREFILLED_SYRINGE | INTRAVENOUS | Status: AC
  Filled 2021-10-15: qty 10

## 2021-10-15 MED ORDER — LABETALOL HCL 5 MG/ML IV SOLN
INTRAVENOUS | Status: DC | PRN
  Administered 2021-10-15 (×2): 2.5 mg via INTRAVENOUS
  Administered 2021-10-15: 5 mg via INTRAVENOUS

## 2021-10-15 MED ORDER — 0.9 % SODIUM CHLORIDE (POUR BTL) OPTIME
TOPICAL | Status: DC | PRN
  Administered 2021-10-15: 12:00:00 1000 mL

## 2021-10-15 MED ORDER — ONDANSETRON HCL 4 MG/2ML IJ SOLN
INTRAMUSCULAR | Status: AC
  Filled 2021-10-15: qty 2

## 2021-10-15 MED ORDER — PROPOFOL 10 MG/ML IV BOLUS
INTRAVENOUS | Status: AC
  Filled 2021-10-15: qty 20

## 2021-10-15 MED ORDER — SODIUM CHLORIDE (PF) 0.9 % IJ SOLN
INTRAMUSCULAR | Status: DC | PRN
  Administered 2021-10-15: 13:00:00 5 mL

## 2021-10-15 MED ORDER — BUPIVACAINE LIPOSOME 1.3 % IJ SUSP
INTRAMUSCULAR | Status: DC | PRN
  Administered 2021-10-15: 20 mL

## 2021-10-15 MED ORDER — LIDOCAINE 2% (20 MG/ML) 5 ML SYRINGE
INTRAMUSCULAR | Status: DC | PRN
  Administered 2021-10-15: 60 mg via INTRAVENOUS

## 2021-10-15 MED ORDER — PHENYLEPHRINE 40 MCG/ML (10ML) SYRINGE FOR IV PUSH (FOR BLOOD PRESSURE SUPPORT)
PREFILLED_SYRINGE | INTRAVENOUS | Status: AC
  Filled 2021-10-15: qty 10

## 2021-10-15 MED ORDER — ONDANSETRON 4 MG PO TBDP: 4.0000 mg | ORAL_TABLET | Freq: Four times a day (QID) | ORAL | Status: DC | PRN

## 2021-10-15 SURGICAL SUPPLY — 46 items
APPLICATOR ARISTA FLEXITIP XL (MISCELLANEOUS) IMPLANT
APPLIER CLIP ROT 10 11.4 M/L (STAPLE) ×2
BAG RETRIEVAL 10 (BASKET) ×1
CATH CHOLANGIOGRAM 4.5FR (CATHETERS) ×2 IMPLANT
CHLORAPREP W/TINT 26 (MISCELLANEOUS) ×2 IMPLANT
CLIP APPLIE ROT 10 11.4 M/L (STAPLE) ×1 IMPLANT
CLOTH BEACON ORANGE TIMEOUT ST (SAFETY) ×2 IMPLANT
COVER LIGHT HANDLE STERIS (MISCELLANEOUS) ×4 IMPLANT
DERMABOND ADVANCED (GAUZE/BANDAGES/DRESSINGS) ×1
DERMABOND ADVANCED .7 DNX12 (GAUZE/BANDAGES/DRESSINGS) ×1 IMPLANT
DRAPE C-ARM FOLDED MOBILE STRL (DRAPES) ×2 IMPLANT
ELECT REM PT RETURN 9FT ADLT (ELECTROSURGICAL) ×2
ELECTRODE REM PT RTRN 9FT ADLT (ELECTROSURGICAL) ×1 IMPLANT
GAUZE 4X4 16PLY ~~LOC~~+RFID DBL (SPONGE) ×1 IMPLANT
GLOVE SURG POLYISO LF SZ7.5 (GLOVE) ×2 IMPLANT
GLOVE SURG UNDER POLY LF SZ7 (GLOVE) ×6 IMPLANT
GOWN STRL REUS W/TWL LRG LVL3 (GOWN DISPOSABLE) ×6 IMPLANT
HEMOSTAT ARISTA ABSORB 3G PWDR (HEMOSTASIS) IMPLANT
HEMOSTAT SNOW SURGICEL 2X4 (HEMOSTASIS) ×2 IMPLANT
INST SET LAPROSCOPIC AP (KITS) ×2 IMPLANT
IV NS IRRIG 3000ML ARTHROMATIC (IV SOLUTION) ×1 IMPLANT
KIT TURNOVER KIT A (KITS) ×2 IMPLANT
MANIFOLD NEPTUNE II (INSTRUMENTS) ×2 IMPLANT
NDL HYPO 18GX1.5 BLUNT FILL (NEEDLE) ×1 IMPLANT
NDL HYPO 21X1.5 SAFETY (NEEDLE) ×1 IMPLANT
NEEDLE HYPO 18GX1.5 BLUNT FILL (NEEDLE) ×2 IMPLANT
NEEDLE HYPO 21X1.5 SAFETY (NEEDLE) ×2 IMPLANT
NEEDLE INSUFFLATION 120MM (ENDOMECHANICALS) ×2 IMPLANT
NS IRRIG 1000ML POUR BTL (IV SOLUTION) ×2 IMPLANT
PACK LAP CHOLE LZT030E (CUSTOM PROCEDURE TRAY) ×2 IMPLANT
PAD ARMBOARD 7.5X6 YLW CONV (MISCELLANEOUS) ×2 IMPLANT
SET BASIN LINEN APH (SET/KITS/TRAYS/PACK) ×2 IMPLANT
SET TUBE IRRIG SUCTION NO TIP (IRRIGATION / IRRIGATOR) ×1 IMPLANT
SET TUBE SMOKE EVAC HIGH FLOW (TUBING) ×2 IMPLANT
SLEEVE ENDOPATH XCEL 5M (ENDOMECHANICALS) ×2 IMPLANT
SUT MNCRL AB 4-0 PS2 18 (SUTURE) ×5 IMPLANT
SUT VICRYL 0 UR6 27IN ABS (SUTURE) ×2 IMPLANT
SYR 20ML LL LF (SYRINGE) ×5 IMPLANT
SYR 30ML LL (SYRINGE) ×2 IMPLANT
SYS BAG RETRIEVAL 10MM (BASKET) ×1
SYSTEM BAG RETRIEVAL 10MM (BASKET) ×1 IMPLANT
TROCAR ENDO BLADELESS 11MM (ENDOMECHANICALS) ×2 IMPLANT
TROCAR XCEL NON-BLD 5MMX100MML (ENDOMECHANICALS) ×2 IMPLANT
TROCAR XCEL UNIV SLVE 11M 100M (ENDOMECHANICALS) ×2 IMPLANT
TUBE CONNECTING 12X1/4 (SUCTIONS) ×2 IMPLANT
WARMER LAPAROSCOPE (MISCELLANEOUS) ×2 IMPLANT

## 2021-10-15 NOTE — Interval H&P Note (Signed)
History and Physical Interval Note:  10/15/2021 11:35 AM  [name]  has presented today for surgery, with the diagnosis of cholelithaisis, choledocholithiasis.  The various methods of treatment have been discussed with the patient and family. After consideration of risks, benefits and other options for treatment, the patient has consented to  Procedure(s): LAPAROSCOPIC CHOLECYSTECTOMY WITH INTRAOPERATIVE CHOLANGIOGRAM (N/A) as a surgical intervention.  The patient's history has been reviewed, patient examined, no change in status, stable for surgery.  I have reviewed the patient's chart and labs.  Questions were answered to the patient's satisfaction.     Franky Macho

## 2021-10-15 NOTE — Op Note (Signed)
Patient:  [name]  DOB:  [birth year]  MRN:  [id number]   Preop Diagnosis: Cholecystitis, cholelithiasis, choledocholithiasis  Postop Diagnosis: Same, resolution of choledocholithiasis  Procedure: Laparoscopic cholecystectomy with intraoperative cholangiograms  Surgeon: Aviva Signs, MD  Anes: General endotracheal  Indications: Patient is a 71 year old black female with a known history of choledocholithiasis as well as cholelithiasis who now presents for laparoscopic cholecystectomy with intraoperative cholangiograms.  The risks and benefits of the procedure including bleeding, infection, hepatobiliary injury, and the possibility of an open procedure were fully explained to the patient, who gave informed consent.  Procedure note: The patient was placed in the supine position.  After induction of general endotracheal anesthesia, the abdomen was prepped and draped using the usual sterile technique with ChloraPrep.  Surgical site confirmation was performed.  A supraumbilical incision was made down to the fascia.  A Veress needle was introduced into the abdominal cavity and confirmation of placement was done using the saline drop test.  The abdomen was then insufflated to 15 mmHg pressure.  An 11 mm trocar was introduced into the abdominal cavity under direct visualization without difficulty.  The patient was placed in reverse Trendelenburg position and an additional 1 mm trocar was placed in the epigastric region and 5 mm trochars were placed the right upper quadrant and right flank regions.  Liver was inspected noted to be within normal limits.  The gallbladder is noted to be very elongated.  It was retracted into dynamic fashion in order to provide a critical view of the triangle of [place].  The cystic duct was fully identified.  Its juncture to the infundibulum was fully identified.  A single Endo Clip was placed proximally on the cystic duct.  An incision was made into the cystic duct and  a cholangiocatheter was inserted.  Using digital fluoroscopy, a cholangiogram was performed.  The contrast flowed freely into the duodenum.  No filling defects were seen.  I was not able to fill the hepatic ducts.  The system was then irrigated with normal saline.  The cholangiocatheter was removed and the cystic duct was ligated multiple times using endoclips.  It was divided without difficulty.  The cystic artery was likewise ligated and divided.  The gallbladder was freed away from the gallbladder fossa using Bovie electrocautery.  The gallbladder was delivered through the epigastric trocar site using an Endo Catch bag.  The gallbladder fossa was inspected and no abnormal bleeding or bile leakage was noted.  Surgicel was placed in the gallbladder fossa.  All fluid and air were then evacuated from the abdominal cavity prior to removal of the trochars.  All wounds were irrigated with normal saline.  All wounds were injected with Exparel.  The supraumbilical fascia was reapproximated using an 0 Vicryl interrupted suture.  All skin incisions were closed using a 4-0 Monocryl subcuticular suture.  Dermabond was applied.  All tape and needle counts were correct at the end of the procedure.  The patient was extubated in the operating room and transferred to PACU in stable condition.  Complications: None  EBL: Minimal  Specimen: Gallbladder

## 2021-10-15 NOTE — Anesthesia Preprocedure Evaluation (Signed)
Anesthesia Evaluation  Patient identified by MRN, date of birth, ID band Patient awake    Reviewed: Allergy & Precautions, H&P , NPO status , Patient's Chart, lab work & pertinent test results, reviewed documented beta blocker date and time   Airway Mallampati: II  TM Distance: >3 FB Neck ROM: full    Dental no notable dental hx. (+) Upper Dentures, Poor Dentition,    Pulmonary neg pulmonary ROS, former smoker,    Pulmonary exam normal breath sounds clear to auscultation       Cardiovascular Exercise Tolerance: Good hypertension, + CAD and + Past MI  Normal cardiovascular exam Rhythm:regular Rate:Normal  Left ventricle: The cavity size was normal. Wall thickness was  normal. Systolic function was normal. The estimated ejection  fraction was in the range of 55% to 60%. Possible akinesis of the  basalinferolateral myocardium. Left ventricular diastolic  function parameters were normal for the patient&'s age.  - Aortic valve: Mildly calcified annulus. Trileaflet.  - Mitral valve: Mildly calcified annulus. There was trivial  regurgitation.  - Atrial septum: No defect or patent foramen ovale was identified.  - Tricuspid valve: There was trivial regurgitation.  - Pulmonary arteries: Systolic pressure could not be accurately  estimated.  - Pericardium, extracardiac: There was no pericardial effusion.    Neuro/Psych PSYCHIATRIC DISORDERS Anxiety Depression CVA    GI/Hepatic Neg liver ROS, GERD  Medicated,Colitis hx   Endo/Other  negative endocrine ROS  Renal/GU negative Renal ROS  negative genitourinary   Musculoskeletal negative musculoskeletal ROS (+)   Abdominal   Peds  Hematology negative hematology ROS (+)   Anesthesia Other Findings   Reproductive/Obstetrics negative OB ROS                             Anesthesia Physical  Anesthesia Plan  ASA: 3  Anesthesia Plan:  General and General ETT   Post-op Pain Management:    Induction:   PONV Risk Score and Plan: 2 and Ondansetron  Airway Management Planned: Oral ETT  Additional Equipment:   Intra-op Plan:   Post-operative Plan:   Informed Consent: I have reviewed the patients History and Physical, chart, labs and discussed the procedure including the risks, benefits and alternatives for the proposed anesthesia with the patient or authorized representative who has indicated his/her understanding and acceptance.     Dental Advisory Given  Plan Discussed with: CRNA  Anesthesia Plan Comments:         Anesthesia Quick Evaluation

## 2021-10-15 NOTE — Anesthesia Procedure Notes (Signed)
Procedure Name: Intubation Date/Time: 10/15/2021 11:55 AM Performed by: Maude Leriche, CRNA Pre-anesthesia Checklist: Patient identified, Emergency Drugs available, Suction available and Patient being monitored Patient Re-evaluated:Patient Re-evaluated prior to induction Oxygen Delivery Method: Circle system utilized Preoxygenation: Pre-oxygenation with 100% oxygen Induction Type: IV induction Ventilation: Mask ventilation without difficulty Laryngoscope Size: Miller and 2 Grade View: Grade I Tube type: Oral Tube size: 6.5 mm Number of attempts: 1 Airway Equipment and Method: Stylet and Oral airway Placement Confirmation: ETT inserted through vocal cords under direct vision, positive ETCO2 and breath sounds checked- equal and bilateral Secured at: 19 cm Tube secured with: Tape Dental Injury: Teeth and Oropharynx as per pre-operative assessment

## 2021-10-15 NOTE — Progress Notes (Addendum)
PROGRESS NOTE  [name] [id number] DOB: 01-26-1951 DOA: 10/09/2021 PCP: Carrolyn Meiers, MD  Brief History:  As per H&P written by Dr. Waldron Labs on 10/09/2021  [name]  is a 71 y.o. female, with past medical history of CVA, CAD, hypertension, hyperlipidemia, functional constipation, patient presents to ED secondary to complaints of abdominal pain, nausea and vomiting, symptoms been going on for last week, she has chronic constipation as well, she mentioned symptoms to her son yesterday, who prompted her to ED, reports symptoms are worse at nighttime, he does report some cough, she denies dyspnea, fever, chills, no history of previous GI symptoms, no coffee-ground emesis, no bright red blood per rectum. -In ED CT abdomen pelvis significant for choledocholithiasis, LFTs are stable, low-grade temperature 99.2, white blood cell count of 13.6, potassium is low at 2.7, creatinine is up at 1.05, ED discussed with gastroenterology, plan for ERCP tomorrow by Dr. Laural Golden, [facility] hospitalist consulted to admit.  She is status post 2 ERCP attempts for CBD stone extraction, but both have been unsuccessful. Biliary sphincterotomy was performed on 1/20, but incomplete as cannulation was lost because scope fell back to stomach and bile duct could not be cannulated again.  Repeat ERCP 1/23 with evidence of recent sphincterotomy, but unable to cannulate bile duct. Dr. Laural Golden has discussed the case with Dr. Ardis Hughs and Dr. Charlynne Cousins in [place] who are unable to perform repeat ERCP.  Encouragingly, she is not obstructed as her LFTs and bilirubin are normal.  Amylase also normalized. She continues with abdominal pain, most pronounced in RUQ and epigastric area, and nausea without vomiting. MRI/MRCP 10/14/20 showed decreased caliber of CBD with single 3-4 mm sludge or sm calculus @ ampulla.  This may reflect changes to recently passed stone.  Mild pericholecystic fluid and stranding.     Assessment/Plan: cholelithiasis with Choledocholithiasis -Reports no nausea or vomiting; still having mild right upper quadrant discomfort. -Normal LFTs and normal WBCs; patient is afebrile. -initially started IV fluids, supportive care and current antibiotic therapy. -GI repeated ERCP on 10/10/21 and 10/13/2021, without success. -after discussing with general surgery, plan is for MRCP  -MRI/MRCP 10/14/20 showed decreased caliber of CBD with single 3-4 mm sludge or sm calculus @ ampulla.  This may reflect changes to recently passed stone.  Mild pericholecystic fluid and stranding. -1/25--lap chole -1/25--discussed with Dr. Arnoldo Morale   Essential hypertension -Stable overall -Continue clonidine and metoprolol   CAD (coronary artery disease), native coronary artery -No chest pain, no shortness of breath -Continue telemetry monitoring. -Continue beta-blocker, continue holding aspirin in the setting of anticipated procedures.   History of stroke -No deficits or new complaints -Resume aspirin for secondary prevention at time of discharge.   GERD/GI prophylaxis -Continue PPI.   chronic constipation -continue Linzess, senna-s -Follow GI recommendations.   Hypokalemia -In the setting of GI losses and decreased oral intake -Continue to follow electrolytes trend and further replete as needed.   macrocytosis with B12 and folate deficiency -Patient has been started on [id number] and folic acid repletion.            Family Communication:  no Family at bedside  Consultants:  GI, general surgery  Code Status:  FULL   DVT Prophylaxis:  SCDs   Procedures: As Listed in Progress Note Above  Antibiotics: Cipro 1/19>>1/25 Flagyl 1/19>>1/25       Subjective: Patient denies fevers, chills, headache, chest pain, dyspnea, nausea, vomiting, diarrhea, abdominal pain, dysuria, hematuria,  hematochezia, and melena.   Objective: Vitals:   10/15/21 1314 10/15/21 1315 10/15/21 1330  10/15/21 1352  BP: 124/62 124/62 (!) 109/95 (!) 162/57  Pulse: 92 82 86 70  Resp: 16 16 18 16   Temp: 98.1 F (36.7 C)     TempSrc:      SpO2: 98% 100% 97% 90%  Weight:      Height:        Intake/Output Summary (Last 24 hours) at 10/15/2021 1417 Last data filed at 10/15/2021 1302 Gross per 24 hour  Intake 2118.18 ml  Output 10 ml  Net 2108.18 ml   Weight change:  Exam:  General:  Pt is alert, follows commands appropriately, not in acute distress HEENT: No icterus, No thrush, No neck mass, [place]/AT Cardiovascular: RRR, S1/S2, no rubs, no gallops Respiratory: diminished BS bilateral. No wheeze Abdomen: Soft/+BS, diffusely tender, non distended, no guarding Extremities: No edema, No lymphangitis, No petechiae, No rashes, no synovitis   Data Reviewed: I have personally reviewed following labs and imaging studies Basic Metabolic Panel: Recent Labs  Lab 10/09/21 2230 10/10/21 0413 10/11/21 0346 10/13/21 0339 10/14/21 0416  NA 139 141 138 139 141  K 3.7 5.0 3.6 3.1* 3.6  CL 106 111 106 110 111  CO2 22 23 23 26 24   GLUCOSE 183* 107* 179* 98 97  BUN 6* 8 12 5* 6*  CREATININE 0.98 1.08* 1.08* 0.81 0.79  CALCIUM 9.0 9.2 9.1 8.0* 8.1*   Liver Function Tests: Recent Labs  Lab 10/09/21 1307 10/10/21 0413 10/11/21 0346 10/13/21 0339 10/14/21 0416  AST 27 24 33 30 22  ALT 17 14 16 15 13   ALKPHOS 112 86 88 65 62  BILITOT 1.0 1.1 1.3* 1.1 0.8  PROT 8.4* 6.7 7.1 5.6* 5.0*  ALBUMIN 4.3 3.3* 3.5 2.8* 2.6*   Recent Labs  Lab 10/09/21 1307 10/11/21 0346 10/13/21 0339 10/14/21 0416  LIPASE 23  --   --   --   AMYLASE  --  117* 64 65   No results for input(s): AMMONIA in the last 168 hours. Coagulation Profile: Recent Labs  Lab 10/10/21 0413  INR 1.0   CBC: Recent Labs  Lab 10/09/21 1307 10/10/21 0413 10/11/21 0346 10/13/21 0339 10/14/21 0416  WBC 13.6* 12.3* 13.2* 8.7 7.5  HGB 14.4 12.3 12.6 10.8* 10.0*  HCT 44.4 39.0 39.4 35.2* 32.0*  MCV 108.0* 108.0*  107.9* 105.7* 106.3*  PLT 233 231 230 190 167   Cardiac Enzymes: No results for input(s): CKTOTAL, CKMB, CKMBINDEX, TROPONINI in the last 168 hours. BNP: Invalid input(s): POCBNP CBG: No results for input(s): GLUCAP in the last 168 hours. HbA1C: No results for input(s): HGBA1C in the last 72 hours. Urine analysis:    Component Value Date/Time   COLORURINE YELLOW 10/09/2021 1415   APPEARANCEUR HAZY (A) 10/09/2021 1415   LABSPEC 1.018 10/09/2021 1415   PHURINE 6.0 10/09/2021 1415   GLUCOSEU NEGATIVE 10/09/2021 1415   HGBUR MODERATE (A) 10/09/2021 1415   BILIRUBINUR NEGATIVE 10/09/2021 1415   KETONESUR 20 (A) 10/09/2021 1415   PROTEINUR >=300 (A) 10/09/2021 1415   UROBILINOGEN 1.0 07/16/2014 0244   NITRITE NEGATIVE 10/09/2021 1415   LEUKOCYTESUR NEGATIVE 10/09/2021 1415   Sepsis Labs: @LABRCNTIP (procalcitonin:4,lacticidven:4) ) Recent Results (from the past 240 hour(s))  Resp Panel by RT-PCR (Flu A&B, Covid) Nasopharyngeal Swab     Status: None   Collection Time: 10/09/21  3:58 PM   Specimen: Nasopharyngeal Swab; Nasopharyngeal(NP) swabs in vial transport medium  Result Value Ref  Range Status   SARS Coronavirus 2 by RT PCR NEGATIVE NEGATIVE Final    Comment: (NOTE) SARS-CoV-2 target nucleic acids are NOT DETECTED.  The SARS-CoV-2 RNA is generally detectable in upper respiratory specimens during the acute phase of infection. The lowest concentration of SARS-CoV-2 viral copies this assay can detect is 138 copies/mL. A negative result does not preclude SARS-Cov-2 infection and should not be used as the sole basis for treatment or other patient management decisions. A negative result may occur with  improper specimen collection/handling, submission of specimen other than nasopharyngeal swab, presence of viral mutation(s) within the areas targeted by this assay, and inadequate number of viral copies(<138 copies/mL). A negative result must be combined with clinical  observations, patient history, and epidemiological information. The expected result is Negative.  Fact Sheet for Patients:  EntrepreneurPulse.com.au  Fact Sheet for Healthcare Providers:  IncredibleEmployment.be  This test is no t yet approved or cleared by the [place] FDA and  has been authorized for detection and/or diagnosis of SARS-CoV-2 by FDA under an Emergency Use Authorization (EUA). This EUA will remain  in effect (meaning this test can be used) for the duration of the COVID-19 declaration under Section 564(b)(1) of the Act, 21 U.S.C.section 360bbb-3(b)(1), unless the authorization is terminated  or revoked sooner.       Influenza A by PCR NEGATIVE NEGATIVE Final   Influenza B by PCR NEGATIVE NEGATIVE Final    Comment: (NOTE) The Xpert Xpress SARS-CoV-2/FLU/RSV plus assay is intended as an aid in the diagnosis of influenza from Nasopharyngeal swab specimens and should not be used as a sole basis for treatment. Nasal washings and aspirates are unacceptable for Xpert Xpress SARS-CoV-2/FLU/RSV testing.  Fact Sheet for Patients: EntrepreneurPulse.com.au  Fact Sheet for Healthcare Providers: IncredibleEmployment.be  This test is not yet approved or cleared by the [place] FDA and has been authorized for detection and/or diagnosis of SARS-CoV-2 by FDA under an Emergency Use Authorization (EUA). This EUA will remain in effect (meaning this test can be used) for the duration of the COVID-19 declaration under Section 564(b)(1) of the Act, 21 U.S.C. section 360bbb-3(b)(1), unless the authorization is terminated or revoked.  Performed at [facility], [address], [place], [place] [address]   Surgical pcr screen     Status: Abnormal   Collection Time: 10/14/21  6:43 PM   Specimen: Nasal Mucosa; Nasal Swab  Result Value Ref Range Status   MRSA, PCR (A) NEGATIVE Final    INVALID, UNABLE TO  DETERMINE THE PRESENCE OF TARGET DUE TO SPECIMEN INTEGRITY. RECOLLECTION REQUESTED.    Comment: RESULT CALLED TO, READ BACK BY AND VERIFIED WITH: [email or web address]  by Zigmund Daniel, b 1.24.23    Staphylococcus aureus (A) NEGATIVE Final    INVALID, UNABLE TO DETERMINE THE PRESENCE OF TARGET DUE TO SPECIMEN INTEGRITY. RECOLLECTION REQUESTED.    Comment: (NOTE) The Xpert SA Assay (FDA approved for NASAL specimens in patients 16 years of age and older), is one component of a comprehensive surveillance program. It is not intended to diagnose infection nor to guide or monitor treatment. Performed at [facility], [address] [place], [place] [address]   Surgical pcr screen     Status: Abnormal   Collection Time: 10/14/21 11:34 PM   Specimen: Nasal Mucosa; Nasal Swab  Result Value Ref Range Status   MRSA, PCR (A) NEGATIVE Final    INVALID, UNABLE TO DETERMINE THE PRESENCE OF TARGET DUE TO SPECIMEN INTEGRITY. RECOLLECTION REQUESTED.    Comment: RCRV [id number]  B.MATTHEWS   Staphylococcus aureus (A) NEGATIVE Final    INVALID, UNABLE TO DETERMINE THE PRESENCE OF TARGET DUE TO SPECIMEN INTEGRITY. RECOLLECTION REQUESTED.    Comment: RCRV [id number] B.MATTHEWS (NOTE) The Xpert SA Assay (FDA approved for NASAL specimens in patients 6 years of age and older), is one component of a comprehensive surveillance program. It is not intended to diagnose infection nor to guide or monitor treatment. Performed at [facility], [address], [place], [place] [address]      Scheduled Meds:  cloNIDine  0.3 mg Oral BID   cyanocobalamin  1,000 mcg Intramuscular Daily   feeding supplement  237 mL Oral BID BM   folic acid  1 mg Oral Daily   linaclotide  145 mcg Oral QAC breakfast   metoprolol tartrate  50 mg Oral BID   pantoprazole  40 mg Oral Daily   polyethylene glycol  17 g Oral BID   senna-docusate  2 tablet Oral BID   Continuous Infusions:  sodium chloride 50 mL/hr at 10/15/21 0557   ciprofloxacin  400 mg (10/15/21 0601)   metronidazole 500 mg (10/15/21 0815)    Procedures/Studies: CT ABDOMEN PELVIS WO CONTRAST  Result Date: 10/09/2021 CLINICAL DATA:  Nausea, vomiting and abdominal pain. EXAM: CT ABDOMEN AND PELVIS WITHOUT CONTRAST TECHNIQUE: Multidetector CT imaging of the abdomen and pelvis was performed following the standard protocol without IV contrast. RADIATION DOSE REDUCTION: This exam was performed according to the departmental dose-optimization program which includes automated exposure control, adjustment of the mA and/or kV according to patient size and/or use of iterative reconstruction technique. COMPARISON:  07/16/2014 FINDINGS: Lower chest: No acute abnormality. Hepatobiliary: Multiple layering gallstones in the gallbladder lumen. The gallbladder is mildly distended and without obvious inflammation. Extrahepatic bile ducts are dilated with the common bile duct reaching maximum diameter of approximately 9-10 mm distally. In the distal common bile duct near the ampulla, focal calcific density measures up to approximately 6 mm in length on the coronal images and is consistent with at least one calculus in the distal common bile duct. The liver appears unremarkable. Pancreas: Unremarkable. No pancreatic ductal dilatation or surrounding inflammatory changes. Spleen: Normal in size without focal abnormality. Adrenals/Urinary Tract: Adrenal glands are unremarkable. Kidneys are normal, without renal calculi, focal lesion, or hydronephrosis. Bladder is unremarkable. Stomach/Bowel: Bowel shows moderate fecal material throughout the colon without evidence of focal fecal impaction. No small bowel dilatation. No signs of free intraperitoneal air. Vascular/Lymphatic: Atherosclerosis of the abdominal aorta and iliac arteries without evidence of aneurysm. No enlarged lymph nodes identified in the abdomen or pelvis. Reproductive: Status post hysterectomy. No adnexal masses. Other: No abdominal wall hernia  or abnormality. No abdominopelvic ascites. Musculoskeletal: No acute or significant osseous findings. IMPRESSION: 1. Cholelithiasis and gallbladder distension without obvious gallbladder inflammation. Evidence of choledocholithiasis with at least one calculus in the distal common bile duct and associated dilatation of the common bile duct up to a diameter of approximately 9-10 mm. 2. Moderate fecal material throughout the colon without focal fecal impaction or evidence of overt bowel obstruction. Electronically Signed   By: Aletta Edouard M.D.   On: 10/09/2021 15:05   DG Chest 2 View  Result Date: 10/09/2021 CLINICAL DATA:  Cough and chest pain. EXAM: CHEST - 2 VIEW COMPARISON:  Chest x-ray 04/19/2007. FINDINGS: There are emphysematous changes in the right upper lobe, unchanged. The lungs are otherwise clear. No pleural effusion or pneumothorax. No acute fractures. IMPRESSION: 1. No acute cardiopulmonary process. 2.  Emphysema (  ICD10-J43.9). Electronically Signed   By: Ronney Asters M.D.   On: 10/09/2021 15:24   DG Cholangiogram Operative  Result Date: 10/15/2021 CLINICAL DATA:  Abdominal pain, cholelithiasis, cholecystectomy EXAM: INTRAOPERATIVE CHOLANGIOGRAM TECHNIQUE: Cholangiographic images from the C-arm fluoroscopic device were submitted for interpretation post-operatively. Please see the procedural report for the amount of contrast and the fluoroscopy time utilized. COMPARISON:  MRCP 10/14/2021 FINDINGS: Injection of contrast into cystic duct stump demonstrates good opacification of extrahepatic biliary tree. Patent CBD with free spillage of contrast into duodenum. No stricture or evidence of filling defects to suggest choledocholithiasis. IMPRESSION: Normal intraoperative cholangiogram. Findings discussed with Dr. Arnoldo Morale on 10/15/2021 at 1305 hours. Electronically Signed   By: Lavonia Dana M.D.   On: 10/15/2021 13:09   MR 3D Recon At Scanner  Result Date: 10/14/2021 CLINICAL DATA:   Cholelithiasis and suspected choledocholithiasis in the setting of abdominal pain in this 71 year old female. EXAM: MRI ABDOMEN WITHOUT AND WITH CONTRAST (INCLUDING MRCP) TECHNIQUE: Multiplanar multisequence MR imaging of the abdomen was performed both before and after the administration of intravenous contrast. Heavily T2-weighted images of the biliary and pancreatic ducts were obtained, and three-dimensional MRCP images were rendered by post processing. CONTRAST:  57mL GADAVIST GADOBUTROL 1 MMOL/ML IV SOLN COMPARISON:  ERCP evaluation from 10/13/2020 and CT of the abdomen and pelvis from October 09, 2021. There are multiple prior CT evaluations as well for comparison comparison is also made with an exam from 2015. FINDINGS: Lower chest: Unremarkable to the extent evaluated on abdominal MRI. Hepatobiliary: No focal, suspicious hepatic lesion. Cyst in the anterior LEFT liver and small cysts in the RIGHT hemi liver. The portal vein is patent. Mild intra and extrahepatic biliary duct distension terminates at the level of the ampulla. Question of 3-4 mm filling defect in the distal common bile duct (image 178/[id number], reconstructed images. Common bile duct may be slightly less distended accounting for differences in modalities for comparison now at 6 as compared to 10 mm. Cholelithiasis, extensive cholelithiasis with sludge in stones layering dependently in the gallbladder many of these are radiopaque and were also seen on CT. Subtle pericholecystic fluid and mild pericholecystic stranding but no signs of wall thickening or hyperenhancement of the gallbladder wall. Pancreas: Pancreas with normal intrinsic T1 signal without peripancreatic inflammation. Top-normal caliber of the main pancreatic duct in the head of the pancreas and mild distension of the pancreatic duct in the body of the pancreas ranging between 3 and 4 mm. No substantial atrophy of pancreatic parenchyma. Pancreatic duct is grossly less dilated on recent  comparison imaging from January of 2023 Spleen:  Unremarkable. Adrenals/Urinary Tract:  Adrenal glands are normal. Renal cysts about the LEFT kidney largest in the upper pole 5.2 cm greatest axial dimension compatible with benign cyst, Bosniak criteria I. Small cyst in the upper pole of the RIGHT kidney measuring 7 mm. No hydronephrosis. No perinephric stranding. Stomach/Bowel: No acute gastrointestinal process to the extent evaluated on abdominal MRI, abundant stool in the colon. 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 Other: Trace pericholecystic fluid and small volume ascites. Mild body wall edema. Musculoskeletal: No suspicious bone lesions identified. IMPRESSION: Decreased caliber of the common bile duct accounting for differences in technique with single 3-4 mm sludge or small biliary calculus at the ampulla. This may reflect changes related to recently passed more substantial calculus, consider biliary enzymes trending for further assessment. Mild pericholecystic fluid in stranding without wall thickening or hyperenhancement. If there is continued concern for acute  biliary process or cholecystitis HIDA scan may be helpful. Cholelithiasis as above with extensive gallstones and sludge in the dependent gallbladder. Mild distension of the pancreatic duct appears increased accounting for differences in comparison modality imaging, potentially related to recent ERCP or even mild pancreatitis though no frank edema, correlate with pancreatic enzymes. Renal cysts bilaterally not requiring follow-up compatible with benign findings. Abundant stool in the colon.  Correlate with signs of constipation. These results will be called to the ordering clinician or representative by the Radiologist Assistant, and communication documented in the PACS or [employer]. Electronically Signed   By: Zetta Bills M.D.   On: 10/14/2021 12:18   DG ERCP  Result Date:  10/13/2021 CLINICAL DATA:  Choledocholithiasis. EXAM: ERCP COMPARISON:  ERCP, 10/10/2021.  CT AP, 10/09/2021 and 07/15/2014. FLUOROSCOPY TIME:  Fluoroscopy Time:  2 minutes 46 seconds Radiation Exposure Index (if provided by the fluoroscopic device): 16.7 mGy Number of Acquired Spot Images: 2 fluoroscopic cine loops, with 19 and 6 images respectively. FINDINGS: Multiple, limited oblique planar images of the RIGHT upper quadrant obtained C-arm. Images demonstrating flexible endoscopy, biliary duct cannulation, and partial retrograde cholangiogram. IMPRESSION: Fluoroscopic imaging for ERCP. For complete description of intra procedural findings, please see performing service dictation. Electronically Signed   By: Michaelle Birks M.D.   On: 10/13/2021 13:26   DG ERCP  Result Date: 10/10/2021 CLINICAL DATA:  Choledocholithiasis EXAM: ERCP COMPARISON:  CT AP, 10/09/2021 and 07/15/2014. FLUOROSCOPY TIME:  Fluoroscopy Time:  3 minutes 37 seconds Radiation Exposure Index (if provided by the fluoroscopic device): 25.1 mGy Number of Acquired Spot Images: Multiple fluoroscopic cine loops, each containing 13 17, 17, 16, 10, 7, 7, 3, 3 and 1 images FINDINGS: Multiple, limited oblique planar images of the RIGHT upper quadrant obtained C-arm. Images demonstrating flexible endoscopy, biliary duct cannulation, sphincterotomy, retrograde cholangiogram and balloon sweep. Common bile duct dilation is present, with at least 1 distal biliary filling defect appreciated. See key image. IMPRESSION: Fluoroscopic imaging for ERCP. Choledocholithiasis, with at least 1 distal biliary stone is demonstrated For complete description of intra procedural findings, please see performing service dictation. Electronically Signed   By: Michaelle Birks M.D.   On: 10/10/2021 19:10   DG C-Arm 1-60 Min-No Report  Result Date: 10/13/2021 Fluoroscopy was utilized by the requesting physician.  No radiographic interpretation.   MR ABDOMEN MRCP W WO  CONTAST  Result Date: 10/14/2021 CLINICAL DATA:  Cholelithiasis and suspected choledocholithiasis in the setting of abdominal pain in this 71 year old female. EXAM: MRI ABDOMEN WITHOUT AND WITH CONTRAST (INCLUDING MRCP) TECHNIQUE: Multiplanar multisequence MR imaging of the abdomen was performed both before and after the administration of intravenous contrast. Heavily T2-weighted images of the biliary and pancreatic ducts were obtained, and three-dimensional MRCP images were rendered by post processing. CONTRAST:  49mL GADAVIST GADOBUTROL 1 MMOL/ML IV SOLN COMPARISON:  ERCP evaluation from 10/13/2020 and CT of the abdomen and pelvis from October 09, 2021. There are multiple prior CT evaluations as well for comparison comparison is also made with an exam from 2015. FINDINGS: Lower chest: Unremarkable to the extent evaluated on abdominal MRI. Hepatobiliary: No focal, suspicious hepatic lesion. Cyst in the anterior LEFT liver and small cysts in the RIGHT hemi liver. The portal vein is patent. Mild intra and extrahepatic biliary duct distension terminates at the level of the ampulla. Question of 3-4 mm filling defect in the distal common bile duct (image [id number], reconstructed images. Common bile duct may be slightly less distended accounting for differences  in modalities for comparison now at 6 as compared to 10 mm. Cholelithiasis, extensive cholelithiasis with sludge in stones layering dependently in the gallbladder many of these are radiopaque and were also seen on CT. Subtle pericholecystic fluid and mild pericholecystic stranding but no signs of wall thickening or hyperenhancement of the gallbladder wall. Pancreas: Pancreas with normal intrinsic T1 signal without peripancreatic inflammation. Top-normal caliber of the main pancreatic duct in the head of the pancreas and mild distension of the pancreatic duct in the body of the pancreas ranging between 3 and 4 mm. No substantial atrophy of pancreatic parenchyma.  Pancreatic duct is grossly less dilated on recent comparison imaging from January of 2023 Spleen:  Unremarkable. Adrenals/Urinary Tract:  Adrenal glands are normal. Renal cysts about the LEFT kidney largest in the upper pole 5.2 cm greatest axial dimension compatible with benign cyst, Bosniak criteria I. Small cyst in the upper pole of the RIGHT kidney measuring 7 mm. No hydronephrosis. No perinephric stranding. Stomach/Bowel: No acute gastrointestinal process to the extent evaluated on abdominal MRI, abundant stool in the colon. 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 Other: Trace pericholecystic fluid and small volume ascites. Mild body wall edema. Musculoskeletal: No suspicious bone lesions identified. IMPRESSION: Decreased caliber of the common bile duct accounting for differences in technique with single 3-4 mm sludge or small biliary calculus at the ampulla. This may reflect changes related to recently passed more substantial calculus, consider biliary enzymes trending for further assessment. Mild pericholecystic fluid in stranding without wall thickening or hyperenhancement. If there is continued concern for acute biliary process or cholecystitis HIDA scan may be helpful. Cholelithiasis as above with extensive gallstones and sludge in the dependent gallbladder. Mild distension of the pancreatic duct appears increased accounting for differences in comparison modality imaging, potentially related to recent ERCP or even mild pancreatitis though no frank edema, correlate with pancreatic enzymes. Renal cysts bilaterally not requiring follow-up compatible with benign findings. Abundant stool in the colon.  Correlate with signs of constipation. These results will be called to the ordering clinician or representative by the Radiologist Assistant, and communication documented in the PACS or [employer]. Electronically Signed   By: Zetta Bills M.D.   On:  10/14/2021 12:18    Orson Eva, DO  [facility]  If 7PM-7AM, please contact night-coverage [email or web address] Password [email or web address] 10/15/2021, 2:17 PM   LOS: 6 days

## 2021-10-15 NOTE — Transfer of Care (Signed)
Immediate Anesthesia Transfer of Care Note  Patient: [name]  Procedure(s) Performed: LAPAROSCOPIC CHOLECYSTECTOMY WITH INTRAOPERATIVE CHOLANGIOGRAM (Abdomen)  Patient Location: PACU  Anesthesia Type:General  Level of Consciousness: awake, patient cooperative and confused  Airway & Oxygen Therapy: Patient Spontanous Breathing and Patient connected to nasal cannula oxygen  Post-op Assessment: Report given to RN, Post -op Vital signs reviewed and stable, Patient moving all extremities X 4 and Patient able to stick tongue midline  Post vital signs: Reviewed  Last Vitals:  Vitals Value Taken Time  BP 124/62 10/15/21 1315  Temp 98.6   Pulse 87 10/15/21 1317  Resp 15 10/15/21 1317  SpO2 100 % 10/15/21 1317  Vitals shown include unvalidated device data.  Last Pain:  Vitals:   10/15/21 1108  TempSrc: Oral  PainSc: 0-No pain      Patients Stated Pain Goal: 2 (10/14/21 0537)  Complications: No notable events documented.

## 2021-10-15 NOTE — Progress Notes (Signed)
Patient has had two surgical PCR's sent to lab for testing. Results have come back invalid x 2. Dr. Clearence Ped notified.

## 2021-10-16 ENCOUNTER — Encounter (HOSPITAL_COMMUNITY): Payer: Self-pay | Admitting: General Surgery

## 2021-10-16 LAB — COMPREHENSIVE METABOLIC PANEL
ALT: 22 U/L (ref 0–44)
AST: 53 U/L — ABNORMAL HIGH (ref 15–41)
Albumin: 3.1 g/dL — ABNORMAL LOW (ref 3.5–5.0)
Alkaline Phosphatase: 64 U/L (ref 38–126)
Anion gap: 8 (ref 5–15)
BUN: 7 mg/dL — ABNORMAL LOW (ref 8–23)
CO2: 27 mmol/L (ref 22–32)
Calcium: 8.4 mg/dL — ABNORMAL LOW (ref 8.9–10.3)
Chloride: 104 mmol/L (ref 98–111)
Creatinine, Ser: 0.76 mg/dL (ref 0.44–1.00)
GFR, Estimated: >60 mL/min (ref >60)
Glucose, Bld: 137 mg/dL — ABNORMAL HIGH (ref 70–99)
Potassium: 3.1 mmol/L — ABNORMAL LOW (ref 3.5–5.1)
Sodium: 139 mmol/L (ref 135–145)
Total Bilirubin: 0.8 mg/dL (ref 0.3–1.2)
Total Protein: 5.7 g/dL — ABNORMAL LOW (ref 6.5–8.1)

## 2021-10-16 LAB — CBC
HCT: 32.1 % — ABNORMAL LOW (ref 36.0–46.0)
Hemoglobin: 10.2 g/dL — ABNORMAL LOW (ref 12.0–15.0)
MCH: 33.3 pg (ref 26.0–34.0)
MCHC: 31.8 g/dL (ref 30.0–36.0)
MCV: 104.9 fL — ABNORMAL HIGH (ref 80.0–100.0)
Platelets: 172 10*3/uL (ref 150–400)
RBC: 3.06 MIL/uL — ABNORMAL LOW (ref 3.87–5.11)
RDW: 12.8 % (ref 11.5–15.5)
WBC: 12.5 10*3/uL — ABNORMAL HIGH (ref 4.0–10.5)
nRBC: 0 % (ref 0.0–0.2)

## 2021-10-16 LAB — PHOSPHORUS: Phosphorus: 2.2 mg/dL — ABNORMAL LOW (ref 2.5–4.6)

## 2021-10-16 LAB — MAGNESIUM: Magnesium: 1.5 mg/dL — ABNORMAL LOW (ref 1.7–2.4)

## 2021-10-16 LAB — SURGICAL PATHOLOGY

## 2021-10-16 MED ORDER — POTASSIUM CHLORIDE CRYS ER 20 MEQ PO TBCR
30.0000 meq | EXTENDED_RELEASE_TABLET | Freq: Once | ORAL | Status: AC
  Administered 2021-10-16: 30 meq via ORAL
  Filled 2021-10-16: qty 1

## 2021-10-16 MED ORDER — HYDROCODONE-ACETAMINOPHEN 5-325 MG PO TABS: 1.0000 | ORAL_TABLET | ORAL | 0 refills | Status: DC | PRN

## 2021-10-16 MED ORDER — VITAMIN B-12 100 MCG PO TABS
500.0000 ug | ORAL_TABLET | Freq: Every day | ORAL | Status: DC
  Administered 2021-10-16: 500 ug via ORAL
  Filled 2021-10-16: qty 5

## 2021-10-16 MED ORDER — CYANOCOBALAMIN 500 MCG PO TABS: 500.0000 ug | ORAL_TABLET | Freq: Every day | ORAL | Status: AC

## 2021-10-16 MED ORDER — K PHOS MONO-SOD PHOS DI & MONO 155-852-130 MG PO TABS
500.0000 mg | ORAL_TABLET | Freq: Every day | ORAL | Status: DC
  Administered 2021-10-16: 500 mg via ORAL
  Filled 2021-10-16: qty 2

## 2021-10-16 MED ORDER — MAGNESIUM SULFATE 2 GM/50ML IV SOLN
2.0000 g | Freq: Once | INTRAVENOUS | Status: AC
  Administered 2021-10-16: 2 g via INTRAVENOUS
  Filled 2021-10-16: qty 50

## 2021-10-16 MED ORDER — FOLIC ACID 1 MG PO TABS: 1.0000 mg | ORAL_TABLET | Freq: Every day | ORAL | Status: AC

## 2021-10-16 NOTE — Progress Notes (Signed)
1 Day Post-Op  Subjective: Patient has no complaints of abdominal pain.  Is eating solid food.  Objective: Vital signs in last 24 hours: Temp:  [98.1 F (36.7 C)-98.9 F (37.2 C)] 98.5 F (36.9 C) (01/26 0601) Pulse Rate:  [66-93] 87 (01/26 0601) Resp:  [16-19] 18 (01/26 0601) BP: (109-162)/(57-95) 143/66 (01/26 0601) SpO2:  [90 %-100 %] 96 % (01/26 0601) Weight:  [47.6 kg] 47.6 kg (01/25 1108) Last BM Date: 10/13/21 (per patient 2 days ago)  Intake/Output from previous day: 01/25 0701 - 01/26 0700 In: 2324.9 [P.O.:720; I.V.:700; IV Piggyback:904.9] Out: 10 [Blood:10] Intake/Output this shift: No intake/output data recorded.  General appearance: alert, cooperative, and no distress GI: Soft, flat.  Incisions healing well.  Lab Results:  Recent Labs    10/14/21 0416 10/16/21 0436  WBC 7.5 12.5*  HGB 10.0* 10.2*  HCT 32.0* 32.1*  PLT 167 172   BMET Recent Labs    10/14/21 0416 10/16/21 0436  NA 141 139  K 3.6 3.1*  CL 111 104  CO2 24 27  GLUCOSE 97 137*  BUN 6* 7*  CREATININE 0.79 0.76  CALCIUM 8.1* 8.4*   PT/INR No results for input(s): LABPROT, INR in the last 72 hours.  Studies/Results: DG Cholangiogram Operative  Result Date: 10/15/2021 CLINICAL DATA:  Abdominal pain, cholelithiasis, cholecystectomy EXAM: INTRAOPERATIVE CHOLANGIOGRAM TECHNIQUE: Cholangiographic images from the C-arm fluoroscopic device were submitted for interpretation post-operatively. Please see the procedural report for the amount of contrast and the fluoroscopy time utilized. COMPARISON:  MRCP 10/14/2021 FINDINGS: Injection of contrast into cystic duct stump demonstrates good opacification of extrahepatic biliary tree. Patent CBD with free spillage of contrast into duodenum. No stricture or evidence of filling defects to suggest choledocholithiasis. IMPRESSION: Normal intraoperative cholangiogram. Findings discussed with Dr. Lovell Sheehan on 10/15/2021 at 1305 hours. Electronically Signed   By:  Ulyses Southward M.D.   On: 10/15/2021 13:09   MR 3D Recon At Scanner  Result Date: 10/14/2021 CLINICAL DATA:  Cholelithiasis and suspected choledocholithiasis in the setting of abdominal pain in this 71 year old female. EXAM: MRI ABDOMEN WITHOUT AND WITH CONTRAST (INCLUDING MRCP) TECHNIQUE: Multiplanar multisequence MR imaging of the abdomen was performed both before and after the administration of intravenous contrast. Heavily T2-weighted images of the biliary and pancreatic ducts were obtained, and three-dimensional MRCP images were rendered by post processing. CONTRAST:  78mL GADAVIST GADOBUTROL 1 MMOL/ML IV SOLN COMPARISON:  ERCP evaluation from 10/13/2020 and CT of the abdomen and pelvis from October 09, 2021. There are multiple prior CT evaluations as well for comparison comparison is also made with an exam from 2015. FINDINGS: Lower chest: Unremarkable to the extent evaluated on abdominal MRI. Hepatobiliary: No focal, suspicious hepatic lesion. Cyst in the anterior LEFT liver and small cysts in the RIGHT hemi liver. The portal vein is patent. Mild intra and extrahepatic biliary duct distension terminates at the level of the ampulla. Question of 3-4 mm filling defect in the distal common bile duct (image [id number], reconstructed images. Common bile duct may be slightly less distended accounting for differences in modalities for comparison now at 6 as compared to 10 mm. Cholelithiasis, extensive cholelithiasis with sludge in stones layering dependently in the gallbladder many of these are radiopaque and were also seen on CT. Subtle pericholecystic fluid and mild pericholecystic stranding but no signs of wall thickening or hyperenhancement of the gallbladder wall. Pancreas: Pancreas with normal intrinsic T1 signal without peripancreatic inflammation. Top-normal caliber of the main pancreatic duct in the head of the  pancreas and mild distension of the pancreatic duct in the body of the pancreas ranging between 3  and 4 mm. No substantial atrophy of pancreatic parenchyma. Pancreatic duct is grossly less dilated on recent comparison imaging from January of 2023 Spleen:  Unremarkable. Adrenals/Urinary Tract:  Adrenal glands are normal. Renal cysts about the LEFT kidney largest in the upper pole 5.2 cm greatest axial dimension compatible with benign cyst, Bosniak criteria I. Small cyst in the upper pole of the RIGHT kidney measuring 7 mm. No hydronephrosis. No perinephric stranding. Stomach/Bowel: No acute gastrointestinal process to the extent evaluated on abdominal MRI, abundant stool in the colon. 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 Other: Trace pericholecystic fluid and small volume ascites. Mild body wall edema. Musculoskeletal: No suspicious bone lesions identified. IMPRESSION: Decreased caliber of the common bile duct accounting for differences in technique with single 3-4 mm sludge or small biliary calculus at the ampulla. This may reflect changes related to recently passed more substantial calculus, consider biliary enzymes trending for further assessment. Mild pericholecystic fluid in stranding without wall thickening or hyperenhancement. If there is continued concern for acute biliary process or cholecystitis HIDA scan may be helpful. Cholelithiasis as above with extensive gallstones and sludge in the dependent gallbladder. Mild distension of the pancreatic duct appears increased accounting for differences in comparison modality imaging, potentially related to recent ERCP or even mild pancreatitis though no frank edema, correlate with pancreatic enzymes. Renal cysts bilaterally not requiring follow-up compatible with benign findings. Abundant stool in the colon.  Correlate with signs of constipation. These results will be called to the ordering clinician or representative by the Radiologist Assistant, and communication documented in the PACS or [employer]. Electronically Signed   By: Donzetta Kohut M.D.   On: 10/14/2021 12:18   MR ABDOMEN MRCP W WO CONTAST  Result Date: 10/14/2021 CLINICAL DATA:  Cholelithiasis and suspected choledocholithiasis in the setting of abdominal pain in this 71 year old female. EXAM: MRI ABDOMEN WITHOUT AND WITH CONTRAST (INCLUDING MRCP) TECHNIQUE: Multiplanar multisequence MR imaging of the abdomen was performed both before and after the administration of intravenous contrast. Heavily T2-weighted images of the biliary and pancreatic ducts were obtained, and three-dimensional MRCP images were rendered by post processing. CONTRAST:  55mL GADAVIST GADOBUTROL 1 MMOL/ML IV SOLN COMPARISON:  ERCP evaluation from 10/13/2020 and CT of the abdomen and pelvis from October 09, 2021. There are multiple prior CT evaluations as well for comparison comparison is also made with an exam from 2015. FINDINGS: Lower chest: Unremarkable to the extent evaluated on abdominal MRI. Hepatobiliary: No focal, suspicious hepatic lesion. Cyst in the anterior LEFT liver and small cysts in the RIGHT hemi liver. The portal vein is patent. Mild intra and extrahepatic biliary duct distension terminates at the level of the ampulla. Question of 3-4 mm filling defect in the distal common bile duct (image [id number], reconstructed images. Common bile duct may be slightly less distended accounting for differences in modalities for comparison now at 6 as compared to 10 mm. Cholelithiasis, extensive cholelithiasis with sludge in stones layering dependently in the gallbladder many of these are radiopaque and were also seen on CT. Subtle pericholecystic fluid and mild pericholecystic stranding but no signs of wall thickening or hyperenhancement of the gallbladder wall. Pancreas: Pancreas with normal intrinsic T1 signal without peripancreatic inflammation. Top-normal caliber of the main pancreatic duct in the head of the pancreas and mild distension of the pancreatic duct  in the body of the pancreas  ranging between 3 and 4 mm. No substantial atrophy of pancreatic parenchyma. Pancreatic duct is grossly less dilated on recent comparison imaging from January of 2023 Spleen:  Unremarkable. Adrenals/Urinary Tract:  Adrenal glands are normal. Renal cysts about the LEFT kidney largest in the upper pole 5.2 cm greatest axial dimension compatible with benign cyst, Bosniak criteria I. Small cyst in the upper pole of the RIGHT kidney measuring 7 mm. No hydronephrosis. No perinephric stranding. Stomach/Bowel: No acute gastrointestinal process to the extent evaluated on abdominal MRI, abundant stool in the colon. 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 Other: Trace pericholecystic fluid and small volume ascites. Mild body wall edema. Musculoskeletal: No suspicious bone lesions identified. IMPRESSION: Decreased caliber of the common bile duct accounting for differences in technique with single 3-4 mm sludge or small biliary calculus at the ampulla. This may reflect changes related to recently passed more substantial calculus, consider biliary enzymes trending for further assessment. Mild pericholecystic fluid in stranding without wall thickening or hyperenhancement. If there is continued concern for acute biliary process or cholecystitis HIDA scan may be helpful. Cholelithiasis as above with extensive gallstones and sludge in the dependent gallbladder. Mild distension of the pancreatic duct appears increased accounting for differences in comparison modality imaging, potentially related to recent ERCP or even mild pancreatitis though no frank edema, correlate with pancreatic enzymes. Renal cysts bilaterally not requiring follow-up compatible with benign findings. Abundant stool in the colon.  Correlate with signs of constipation. These results will be called to the ordering clinician or representative by the Radiologist Assistant, and  communication documented in the PACS or [employer]. Electronically Signed   By: Donzetta Kohut M.D.   On: 10/14/2021 12:18    Anti-infectives: Anti-infectives (From admission, onward)    Start     Dose/Rate Route Frequency Ordered Stop   10/09/21 1700  ciprofloxacin (CIPRO) IVPB 400 mg  Status:  Discontinued        400 mg 200 mL/hr over 60 Minutes Intravenous Every 12 hours 10/09/21 1645 10/15/21 1938   10/09/21 1700  metroNIDAZOLE (FLAGYL) IVPB 500 mg  Status:  Discontinued        500 mg 100 mL/hr over 60 Minutes Intravenous Every 8 hours 10/09/21 1645 10/15/21 1938       Assessment/Plan: s/p Procedure(s): LAPAROSCOPIC CHOLECYSTECTOMY WITH INTRAOPERATIVE CHOLANGIOGRAM Impression: Stable on postoperative day 1.  Leukocytosis most likely secondary to surgical intervention.  LFTs are all within normal limits.  Okay for discharge from surgery standpoint.  No need to be discharged on antibiotics.  LOS: 7 days    Franky Macho 10/16/2021

## 2021-10-16 NOTE — Anesthesia Postprocedure Evaluation (Signed)
Anesthesia Post Note  Patient: [name]  Procedure(s) Performed: LAPAROSCOPIC CHOLECYSTECTOMY WITH INTRAOPERATIVE CHOLANGIOGRAM (Abdomen)  Patient location during evaluation: PACU Anesthesia Type: General Level of consciousness: awake Pain management: pain level controlled Vital Signs Assessment: post-procedure vital signs reviewed and stable Respiratory status: spontaneous breathing and respiratory function stable Cardiovascular status: blood pressure returned to baseline and stable Postop Assessment: no headache and no apparent nausea or vomiting Anesthetic complications: no Comments: Late entry   No notable events documented.   Last Vitals:  Vitals:   10/15/21 2036 10/16/21 0601  BP: (!) 150/60 (!) 143/66  Pulse: 93 87  Resp: 19 18  Temp: 37.2 C 36.9 C  SpO2: 95% 96%    Last Pain:  Vitals:   10/15/21 2147  TempSrc:   PainSc: 7                  Windell Norfolk

## 2021-10-16 NOTE — Discharge Summary (Addendum)
Physician Discharge Summary  [name] [id number] DOB: 05-22-1951 DOA: 10/09/2021  PCP: Carrolyn Meiers, MD  Admit date: 10/09/2021 Discharge date: 10/16/2021  Admitted From: Home Disposition:  Home   Recommendations for Outpatient Follow-up:  Follow up with PCP in 1-2 weeks Please obtain BMP/CBC in one week     Discharge Condition: Stable CODE STATUS:FULL Diet recommendation: Heart Healthy / Carb Modified / Dysphagia / Regular   Brief/Interim Summary: As per H&P written by Dr. Waldron Labs on 10/09/2021  Emonnie [name]  is a 71 y.o. female, with past medical history of CVA, CAD, hypertension, hyperlipidemia, functional constipation, patient presents to ED secondary to complaints of abdominal pain, nausea and vomiting, symptoms been going on for last week, she has chronic constipation as well, she mentioned symptoms to her son yesterday, who prompted her to ED, reports symptoms are worse at nighttime, he does report some cough, she denies dyspnea, fever, chills, no history of previous GI symptoms, no coffee-ground emesis, no bright red blood per rectum. -In ED CT abdomen pelvis significant for choledocholithiasis, LFTs are stable, low-grade temperature 99.2, white blood cell count of 13.6, potassium is low at 2.7, creatinine is up at 1.05, ED discussed with gastroenterology, plan for ERCP tomorrow by Dr. Laural Golden, [facility] hospitalist consulted to admit.   She is status post 2 ERCP attempts for CBD stone extraction, but both have been unsuccessful. Biliary sphincterotomy was performed on 1/20, but incomplete as cannulation was lost because scope fell back to stomach and bile duct could not be cannulated again.  Repeat ERCP 1/23 with evidence of recent sphincterotomy, but unable to cannulate bile duct. Dr. Laural Golden has discussed the case with Dr. Ardis Hughs and Dr. Charlynne Cousins in [place] who are unable to perform repeat ERCP.   Encouragingly, she is not obstructed as her LFTs and  bilirubin are normal.  Amylase also normalized. She continues with abdominal pain, most pronounced in RUQ and epigastric area, and nausea without vomiting. MRI/MRCP 10/14/20 showed decreased caliber of CBD with single 3-4 mm sludge or sm calculus @ ampulla.  This may reflect changes to recently passed stone.  Mild pericholecystic fluid and stranding.    GI and general surgery were consulted to assist with management.  Discharge Diagnoses:  cholelithiasis with Choledocholithiasis -Reports no nausea or vomiting; still having mild right upper quadrant discomfort. -Normal LFTs and normal WBCs; patient is afebrile. -initially started IV fluids, supportive care and current antibiotic therapy. -GI repeated ERCP on 10/10/21 and 10/13/2021, without success. -after discussing with general surgery, plan is for MRCP  -MRI/MRCP 10/14/20 showed decreased caliber of CBD with single 3-4 mm sludge or sm calculus @ ampulla.  This may reflect changes to recently passed stone.  Mild pericholecystic fluid and stranding. -1/25--lap chole -1/25--discussed with Dr. Arnoldo Morale   Essential hypertension -Stable overall -Continue clonidine and metoprolol     CAD (coronary artery disease), native coronary artery -No chest pain, no shortness of breath -Continue telemetry monitoring. -Continue beta-blocker, continue holding aspirin in the setting of anticipated procedures.   History of stroke -No deficits or new complaints -Resume aspirin for secondary prevention at time of discharge.   GERD/GI prophylaxis -Continue PPI.   chronic constipation -continue Linzess, senna-s -Follow GI recommendations.   Hypokalemia/Hypomagnesemia/Hypophosphatemia -In the setting of GI losses and decreased oral intake -Continue to follow electrolytes trend and further replete as needed.   macrocytosis with B12 and folate deficiency -Patient has been started on [id number] and folic acid repletion. -d/c home with po B12 and  folate  Discharge Instructions   Allergies as of 10/16/2021       Reactions   Penicillins Anaphylaxis   Aspirin Swelling, Other (See Comments)   Only in large doses will cause a reaction   Bee Venom Swelling   Severe swelling   Contrast Media [iodinated Contrast Media] Swelling   Dilaudid [hydromorphone Hcl] Itching   Lisinopril Swelling   Motrin [ibuprofen] Swelling        Medication List     STOP taking these medications    lubiprostone 24 MCG capsule Commonly known as: Amitiza   lubiprostone 8 MCG capsule Commonly known as: Amitiza   simvastatin 40 MG tablet Commonly known as: ZOCOR       TAKE these medications    albuterol 108 (90 Base) MCG/ACT inhaler Commonly known as: VENTOLIN HFA Inhale into the lungs.   amLODipine 10 MG tablet Commonly known as: NORVASC Take 10 mg by mouth daily.   aspirin EC 81 MG tablet Take 81 mg by mouth daily.   cloNIDine 0.3 MG tablet Commonly known as: CATAPRES Take 0.3 mg by mouth 2 (two) times daily.   dextromethorphan-guaiFENesin 30-600 MG 12hr tablet Commonly known as: MUCINEX DM Take 1 tablet by mouth 2 (two) times daily.   diphenhydrAMINE 25 MG tablet Commonly known as: BENADRYL Take 1 tablet (25 mg total) by mouth every 8 (eight) hours as needed (Tinnitus).   docusate sodium 100 MG capsule Commonly known as: COLACE Take 100 mg by mouth daily.   folic acid 1 MG tablet Commonly known as: FOLVITE Take 1 tablet (1 mg total) by mouth daily. Start taking on: October 17, 2021   HYDROcodone-acetaminophen 5-325 MG tablet Commonly known as: NORCO/VICODIN Take 1-2 tablets by mouth every 4 (four) hours as needed for moderate pain. What changed:  how much to take how to take this when to take this reasons to take this additional instructions   Linzess 72 MCG capsule Generic drug: linaclotide Take 72 mcg by mouth daily.   metoprolol tartrate 50 MG tablet Commonly known as: LOPRESSOR Take 50 mg by mouth  2 (two) times daily.   omeprazole 40 MG capsule Commonly known as: PRILOSEC   polyethylene glycol powder 17 GM/SCOOP powder Commonly known as: GLYCOLAX/MIRALAX Take 17 g by mouth daily.   rosuvastatin 5 MG tablet Commonly known as: CRESTOR Take by mouth.   vitamin B-12 500 MCG tablet Commonly known as: CYANOCOBALAMIN Take 1 tablet (500 mcg total) by mouth daily.        Follow-up Information     Aviva Signs, MD Follow up.   Specialty: General Surgery Why: As needed.  Will call you next week for follow up. Contact information: [address] [place] [id number] [phone]                Allergies  Allergen Reactions   Penicillins Anaphylaxis   Aspirin Swelling and Other (See Comments)    Only in large doses will cause a reaction   Bee Venom Swelling    Severe swelling   Contrast Media [Iodinated Contrast Media] Swelling   Dilaudid [Hydromorphone Hcl] Itching   Lisinopril Swelling   Motrin [Ibuprofen] Swelling    Consultations: GI General surgery   Procedures/Studies: CT ABDOMEN PELVIS WO CONTRAST  Result Date: 10/09/2021 CLINICAL DATA:  Nausea, vomiting and abdominal pain. EXAM: CT ABDOMEN AND PELVIS WITHOUT CONTRAST TECHNIQUE: Multidetector CT imaging of the abdomen and pelvis was performed following the standard protocol without IV contrast. RADIATION DOSE REDUCTION: This exam was performed  according to the departmental dose-optimization program which includes automated exposure control, adjustment of the mA and/or kV according to patient size and/or use of iterative reconstruction technique. COMPARISON:  07/16/2014 FINDINGS: Lower chest: No acute abnormality. Hepatobiliary: Multiple layering gallstones in the gallbladder lumen. The gallbladder is mildly distended and without obvious inflammation. Extrahepatic bile ducts are dilated with the common bile duct reaching maximum diameter of approximately 9-10 mm distally. In the distal common bile  duct near the ampulla, focal calcific density measures up to approximately 6 mm in length on the coronal images and is consistent with at least one calculus in the distal common bile duct. The liver appears unremarkable. Pancreas: Unremarkable. No pancreatic ductal dilatation or surrounding inflammatory changes. Spleen: Normal in size without focal abnormality. Adrenals/Urinary Tract: Adrenal glands are unremarkable. Kidneys are normal, without renal calculi, focal lesion, or hydronephrosis. Bladder is unremarkable. Stomach/Bowel: Bowel shows moderate fecal material throughout the colon without evidence of focal fecal impaction. No small bowel dilatation. No signs of free intraperitoneal air. Vascular/Lymphatic: Atherosclerosis of the abdominal aorta and iliac arteries without evidence of aneurysm. No enlarged lymph nodes identified in the abdomen or pelvis. Reproductive: Status post hysterectomy. No adnexal masses. Other: No abdominal wall hernia or abnormality. No abdominopelvic ascites. Musculoskeletal: No acute or significant osseous findings. IMPRESSION: 1. Cholelithiasis and gallbladder distension without obvious gallbladder inflammation. Evidence of choledocholithiasis with at least one calculus in the distal common bile duct and associated dilatation of the common bile duct up to a diameter of approximately 9-10 mm. 2. Moderate fecal material throughout the colon without focal fecal impaction or evidence of overt bowel obstruction. Electronically Signed   By: Aletta Edouard M.D.   On: 10/09/2021 15:05   DG Chest 2 View  Result Date: 10/09/2021 CLINICAL DATA:  Cough and chest pain. EXAM: CHEST - 2 VIEW COMPARISON:  Chest x-ray 04/19/2007. FINDINGS: There are emphysematous changes in the right upper lobe, unchanged. The lungs are otherwise clear. No pleural effusion or pneumothorax. No acute fractures. IMPRESSION: 1. No acute cardiopulmonary process. 2.  Emphysema (ICD10-J43.9). Electronically Signed    By: Ronney Asters M.D.   On: 10/09/2021 15:24   DG Cholangiogram Operative  Result Date: 10/15/2021 CLINICAL DATA:  Abdominal pain, cholelithiasis, cholecystectomy EXAM: INTRAOPERATIVE CHOLANGIOGRAM TECHNIQUE: Cholangiographic images from the C-arm fluoroscopic device were submitted for interpretation post-operatively. Please see the procedural report for the amount of contrast and the fluoroscopy time utilized. COMPARISON:  MRCP 10/14/2021 FINDINGS: Injection of contrast into cystic duct stump demonstrates good opacification of extrahepatic biliary tree. Patent CBD with free spillage of contrast into duodenum. No stricture or evidence of filling defects to suggest choledocholithiasis. IMPRESSION: Normal intraoperative cholangiogram. Findings discussed with Dr. Arnoldo Morale on 10/15/2021 at 1305 hours. Electronically Signed   By: Lavonia Dana M.D.   On: 10/15/2021 13:09   MR 3D Recon At Scanner  Result Date: 10/14/2021 CLINICAL DATA:  Cholelithiasis and suspected choledocholithiasis in the setting of abdominal pain in this 71 year old female. EXAM: MRI ABDOMEN WITHOUT AND WITH CONTRAST (INCLUDING MRCP) TECHNIQUE: Multiplanar multisequence MR imaging of the abdomen was performed both before and after the administration of intravenous contrast. Heavily T2-weighted images of the biliary and pancreatic ducts were obtained, and three-dimensional MRCP images were rendered by post processing. CONTRAST:  47mL GADAVIST GADOBUTROL 1 MMOL/ML IV SOLN COMPARISON:  ERCP evaluation from 10/13/2020 and CT of the abdomen and pelvis from October 09, 2021. There are multiple prior CT evaluations as well for comparison comparison is also made with an  exam from 2015. FINDINGS: Lower chest: Unremarkable to the extent evaluated on abdominal MRI. Hepatobiliary: No focal, suspicious hepatic lesion. Cyst in the anterior LEFT liver and small cysts in the RIGHT hemi liver. The portal vein is patent. Mild intra and extrahepatic biliary duct  distension terminates at the level of the ampulla. Question of 3-4 mm filling defect in the distal common bile duct (image [id number], reconstructed images. Common bile duct may be slightly less distended accounting for differences in modalities for comparison now at 6 as compared to 10 mm. Cholelithiasis, extensive cholelithiasis with sludge in stones layering dependently in the gallbladder many of these are radiopaque and were also seen on CT. Subtle pericholecystic fluid and mild pericholecystic stranding but no signs of wall thickening or hyperenhancement of the gallbladder wall. Pancreas: Pancreas with normal intrinsic T1 signal without peripancreatic inflammation. Top-normal caliber of the main pancreatic duct in the head of the pancreas and mild distension of the pancreatic duct in the body of the pancreas ranging between 3 and 4 mm. No substantial atrophy of pancreatic parenchyma. Pancreatic duct is grossly less dilated on recent comparison imaging from January of 2023 Spleen:  Unremarkable. Adrenals/Urinary Tract:  Adrenal glands are normal. Renal cysts about the LEFT kidney largest in the upper pole 5.2 cm greatest axial dimension compatible with benign cyst, Bosniak criteria I. Small cyst in the upper pole of the RIGHT kidney measuring 7 mm. No hydronephrosis. No perinephric stranding. Stomach/Bowel: No acute gastrointestinal process to the extent evaluated on abdominal MRI, abundant stool in the colon. 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 Other: Trace pericholecystic fluid and small volume ascites. Mild body wall edema. Musculoskeletal: No suspicious bone lesions identified. IMPRESSION: Decreased caliber of the common bile duct accounting for differences in technique with single 3-4 mm sludge or small biliary calculus at the ampulla. This may reflect changes related to recently passed more substantial calculus, consider biliary enzymes trending for  further assessment. Mild pericholecystic fluid in stranding without wall thickening or hyperenhancement. If there is continued concern for acute biliary process or cholecystitis HIDA scan may be helpful. Cholelithiasis as above with extensive gallstones and sludge in the dependent gallbladder. Mild distension of the pancreatic duct appears increased accounting for differences in comparison modality imaging, potentially related to recent ERCP or even mild pancreatitis though no frank edema, correlate with pancreatic enzymes. Renal cysts bilaterally not requiring follow-up compatible with benign findings. Abundant stool in the colon.  Correlate with signs of constipation. These results will be called to the ordering clinician or representative by the Radiologist Assistant, and communication documented in the PACS or [employer]. Electronically Signed   By: Zetta Bills M.D.   On: 10/14/2021 12:18   DG ERCP  Result Date: 10/13/2021 CLINICAL DATA:  Choledocholithiasis. EXAM: ERCP COMPARISON:  ERCP, 10/10/2021.  CT AP, 10/09/2021 and 07/15/2014. FLUOROSCOPY TIME:  Fluoroscopy Time:  2 minutes 46 seconds Radiation Exposure Index (if provided by the fluoroscopic device): 16.7 mGy Number of Acquired Spot Images: 2 fluoroscopic cine loops, with 19 and 6 images respectively. FINDINGS: Multiple, limited oblique planar images of the RIGHT upper quadrant obtained C-arm. Images demonstrating flexible endoscopy, biliary duct cannulation, and partial retrograde cholangiogram. IMPRESSION: Fluoroscopic imaging for ERCP. For complete description of intra procedural findings, please see performing service dictation. Electronically Signed   By: Michaelle Birks M.D.   On: 10/13/2021 13:26   DG ERCP  Result Date: 10/10/2021 CLINICAL DATA:  Choledocholithiasis EXAM: ERCP COMPARISON:  CT AP, 10/09/2021  and 07/15/2014. FLUOROSCOPY TIME:  Fluoroscopy Time:  3 minutes 37 seconds Radiation Exposure Index (if provided by the  fluoroscopic device): 25.1 mGy Number of Acquired Spot Images: Multiple fluoroscopic cine loops, each containing 13 17, 17, 16, 10, 7, 7, 3, 3 and 1 images FINDINGS: Multiple, limited oblique planar images of the RIGHT upper quadrant obtained C-arm. Images demonstrating flexible endoscopy, biliary duct cannulation, sphincterotomy, retrograde cholangiogram and balloon sweep. Common bile duct dilation is present, with at least 1 distal biliary filling defect appreciated. See key image. IMPRESSION: Fluoroscopic imaging for ERCP. Choledocholithiasis, with at least 1 distal biliary stone is demonstrated For complete description of intra procedural findings, please see performing service dictation. Electronically Signed   By: Michaelle Birks M.D.   On: 10/10/2021 19:10   DG C-Arm 1-60 Min-No Report  Result Date: 10/13/2021 Fluoroscopy was utilized by the requesting physician.  No radiographic interpretation.   MR ABDOMEN MRCP W WO CONTAST  Result Date: 10/14/2021 CLINICAL DATA:  Cholelithiasis and suspected choledocholithiasis in the setting of abdominal pain in this 71 year old female. EXAM: MRI ABDOMEN WITHOUT AND WITH CONTRAST (INCLUDING MRCP) TECHNIQUE: Multiplanar multisequence MR imaging of the abdomen was performed both before and after the administration of intravenous contrast. Heavily T2-weighted images of the biliary and pancreatic ducts were obtained, and three-dimensional MRCP images were rendered by post processing. CONTRAST:  72mL GADAVIST GADOBUTROL 1 MMOL/ML IV SOLN COMPARISON:  ERCP evaluation from 10/13/2020 and CT of the abdomen and pelvis from October 09, 2021. There are multiple prior CT evaluations as well for comparison comparison is also made with an exam from 2015. FINDINGS: Lower chest: Unremarkable to the extent evaluated on abdominal MRI. Hepatobiliary: No focal, suspicious hepatic lesion. Cyst in the anterior LEFT liver and small cysts in the RIGHT hemi liver. The portal vein is patent.  Mild intra and extrahepatic biliary duct distension terminates at the level of the ampulla. Question of 3-4 mm filling defect in the distal common bile duct (image [id number], reconstructed images. Common bile duct may be slightly less distended accounting for differences in modalities for comparison now at 6 as compared to 10 mm. Cholelithiasis, extensive cholelithiasis with sludge in stones layering dependently in the gallbladder many of these are radiopaque and were also seen on CT. Subtle pericholecystic fluid and mild pericholecystic stranding but no signs of wall thickening or hyperenhancement of the gallbladder wall. Pancreas: Pancreas with normal intrinsic T1 signal without peripancreatic inflammation. Top-normal caliber of the main pancreatic duct in the head of the pancreas and mild distension of the pancreatic duct in the body of the pancreas ranging between 3 and 4 mm. No substantial atrophy of pancreatic parenchyma. Pancreatic duct is grossly less dilated on recent comparison imaging from January of 2023 Spleen:  Unremarkable. Adrenals/Urinary Tract:  Adrenal glands are normal. Renal cysts about the LEFT kidney largest in the upper pole 5.2 cm greatest axial dimension compatible with benign cyst, Bosniak criteria I. Small cyst in the upper pole of the RIGHT kidney measuring 7 mm. No hydronephrosis. No perinephric stranding. Stomach/Bowel: No acute gastrointestinal process to the extent evaluated on abdominal MRI, abundant stool in the colon. 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 Other: Trace pericholecystic fluid and small volume ascites. Mild body wall edema. Musculoskeletal: No suspicious bone lesions identified. IMPRESSION: Decreased caliber of the common bile duct accounting for differences in technique with single 3-4 mm sludge or small biliary calculus at the ampulla. This may reflect changes related to recently passed more  substantial  calculus, consider biliary enzymes trending for further assessment. Mild pericholecystic fluid in stranding without wall thickening or hyperenhancement. If there is continued concern for acute biliary process or cholecystitis HIDA scan may be helpful. Cholelithiasis as above with extensive gallstones and sludge in the dependent gallbladder. Mild distension of the pancreatic duct appears increased accounting for differences in comparison modality imaging, potentially related to recent ERCP or even mild pancreatitis though no frank edema, correlate with pancreatic enzymes. Renal cysts bilaterally not requiring follow-up compatible with benign findings. Abundant stool in the colon.  Correlate with signs of constipation. These results will be called to the ordering clinician or representative by the Radiologist Assistant, and communication documented in the PACS or [employer]. Electronically Signed   By: Zetta Bills M.D.   On: 10/14/2021 12:18        Discharge Exam: Vitals:   10/15/21 2036 10/16/21 0601  BP: (!) 150/60 (!) 143/66  Pulse: 93 87  Resp: 19 18  Temp: 98.9 F (37.2 C) 98.5 F (36.9 C)  SpO2: 95% 96%   Vitals:   10/15/21 1330 10/15/21 1352 10/15/21 2036 10/16/21 0601  BP: (!) 109/95 (!) 162/57 (!) 150/60 (!) 143/66  Pulse: 86 70 93 87  Resp: 18 16 19 18   Temp:   98.9 F (37.2 C) 98.5 F (36.9 C)  TempSrc:   Oral   SpO2: 97% 90% 95% 96%  Weight:      Height:        General: Pt is alert, awake, not in acute distress Cardiovascular: RRR, S1/S2 +, no rubs, no gallops Respiratory: diminished BS,  bibasilar crackles. Abdominal: Soft, mild diffuse tenderness, ND, bowel sounds + Extremities: no edema, no cyanosis   The results of significant diagnostics from this hospitalization (including imaging, microbiology, ancillary and laboratory) are listed below for reference.    Significant Diagnostic Studies: CT ABDOMEN PELVIS WO CONTRAST  Result Date: 10/09/2021 CLINICAL  DATA:  Nausea, vomiting and abdominal pain. EXAM: CT ABDOMEN AND PELVIS WITHOUT CONTRAST TECHNIQUE: Multidetector CT imaging of the abdomen and pelvis was performed following the standard protocol without IV contrast. RADIATION DOSE REDUCTION: This exam was performed according to the departmental dose-optimization program which includes automated exposure control, adjustment of the mA and/or kV according to patient size and/or use of iterative reconstruction technique. COMPARISON:  07/16/2014 FINDINGS: Lower chest: No acute abnormality. Hepatobiliary: Multiple layering gallstones in the gallbladder lumen. The gallbladder is mildly distended and without obvious inflammation. Extrahepatic bile ducts are dilated with the common bile duct reaching maximum diameter of approximately 9-10 mm distally. In the distal common bile duct near the ampulla, focal calcific density measures up to approximately 6 mm in length on the coronal images and is consistent with at least one calculus in the distal common bile duct. The liver appears unremarkable. Pancreas: Unremarkable. No pancreatic ductal dilatation or surrounding inflammatory changes. Spleen: Normal in size without focal abnormality. Adrenals/Urinary Tract: Adrenal glands are unremarkable. Kidneys are normal, without renal calculi, focal lesion, or hydronephrosis. Bladder is unremarkable. Stomach/Bowel: Bowel shows moderate fecal material throughout the colon without evidence of focal fecal impaction. No small bowel dilatation. No signs of free intraperitoneal air. Vascular/Lymphatic: Atherosclerosis of the abdominal aorta and iliac arteries without evidence of aneurysm. No enlarged lymph nodes identified in the abdomen or pelvis. Reproductive: Status post hysterectomy. No adnexal masses. Other: No abdominal wall hernia or abnormality. No abdominopelvic ascites. Musculoskeletal: No acute or significant osseous findings. IMPRESSION: 1. Cholelithiasis and gallbladder  distension without  obvious gallbladder inflammation. Evidence of choledocholithiasis with at least one calculus in the distal common bile duct and associated dilatation of the common bile duct up to a diameter of approximately 9-10 mm. 2. Moderate fecal material throughout the colon without focal fecal impaction or evidence of overt bowel obstruction. Electronically Signed   By: Aletta Edouard M.D.   On: 10/09/2021 15:05   DG Chest 2 View  Result Date: 10/09/2021 CLINICAL DATA:  Cough and chest pain. EXAM: CHEST - 2 VIEW COMPARISON:  Chest x-ray 04/19/2007. FINDINGS: There are emphysematous changes in the right upper lobe, unchanged. The lungs are otherwise clear. No pleural effusion or pneumothorax. No acute fractures. IMPRESSION: 1. No acute cardiopulmonary process. 2.  Emphysema (ICD10-J43.9). Electronically Signed   By: Ronney Asters M.D.   On: 10/09/2021 15:24   DG Cholangiogram Operative  Result Date: 10/15/2021 CLINICAL DATA:  Abdominal pain, cholelithiasis, cholecystectomy EXAM: INTRAOPERATIVE CHOLANGIOGRAM TECHNIQUE: Cholangiographic images from the C-arm fluoroscopic device were submitted for interpretation post-operatively. Please see the procedural report for the amount of contrast and the fluoroscopy time utilized. COMPARISON:  MRCP 10/14/2021 FINDINGS: Injection of contrast into cystic duct stump demonstrates good opacification of extrahepatic biliary tree. Patent CBD with free spillage of contrast into duodenum. No stricture or evidence of filling defects to suggest choledocholithiasis. IMPRESSION: Normal intraoperative cholangiogram. Findings discussed with Dr. Arnoldo Morale on 10/15/2021 at 1305 hours. Electronically Signed   By: Lavonia Dana M.D.   On: 10/15/2021 13:09   MR 3D Recon At Scanner  Result Date: 10/14/2021 CLINICAL DATA:  Cholelithiasis and suspected choledocholithiasis in the setting of abdominal pain in this 71 year old female. EXAM: MRI ABDOMEN WITHOUT AND WITH CONTRAST  (INCLUDING MRCP) TECHNIQUE: Multiplanar multisequence MR imaging of the abdomen was performed both before and after the administration of intravenous contrast. Heavily T2-weighted images of the biliary and pancreatic ducts were obtained, and three-dimensional MRCP images were rendered by post processing. CONTRAST:  44mL GADAVIST GADOBUTROL 1 MMOL/ML IV SOLN COMPARISON:  ERCP evaluation from 10/13/2020 and CT of the abdomen and pelvis from October 09, 2021. There are multiple prior CT evaluations as well for comparison comparison is also made with an exam from 2015. FINDINGS: Lower chest: Unremarkable to the extent evaluated on abdominal MRI. Hepatobiliary: No focal, suspicious hepatic lesion. Cyst in the anterior LEFT liver and small cysts in the RIGHT hemi liver. The portal vein is patent. Mild intra and extrahepatic biliary duct distension terminates at the level of the ampulla. Question of 3-4 mm filling defect in the distal common bile duct (image 178/1014, reconstructed images. Common bile duct may be slightly less distended accounting for differences in modalities for comparison now at 6 as compared to 10 mm. Cholelithiasis, extensive cholelithiasis with sludge in stones layering dependently in the gallbladder many of these are radiopaque and were also seen on CT. Subtle pericholecystic fluid and mild pericholecystic stranding but no signs of wall thickening or hyperenhancement of the gallbladder wall. Pancreas: Pancreas with normal intrinsic T1 signal without peripancreatic inflammation. Top-normal caliber of the main pancreatic duct in the head of the pancreas and mild distension of the pancreatic duct in the body of the pancreas ranging between 3 and 4 mm. No substantial atrophy of pancreatic parenchyma. Pancreatic duct is grossly less dilated on recent comparison imaging from January of 2023 Spleen:  Unremarkable. Adrenals/Urinary Tract:  Adrenal glands are normal. Renal cysts about the LEFT kidney largest  in the upper pole 5.2 cm greatest axial dimension compatible with benign cyst, Bosniak criteria  I. Small cyst in the upper pole of the RIGHT kidney measuring 7 mm. No hydronephrosis. No perinephric stranding. Stomach/Bowel: No acute gastrointestinal process to the extent evaluated on abdominal MRI, abundant stool in the colon. 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 Other: Trace pericholecystic fluid and small volume ascites. Mild body wall edema. Musculoskeletal: No suspicious bone lesions identified. IMPRESSION: Decreased caliber of the common bile duct accounting for differences in technique with single 3-4 mm sludge or small biliary calculus at the ampulla. This may reflect changes related to recently passed more substantial calculus, consider biliary enzymes trending for further assessment. Mild pericholecystic fluid in stranding without wall thickening or hyperenhancement. If there is continued concern for acute biliary process or cholecystitis HIDA scan may be helpful. Cholelithiasis as above with extensive gallstones and sludge in the dependent gallbladder. Mild distension of the pancreatic duct appears increased accounting for differences in comparison modality imaging, potentially related to recent ERCP or even mild pancreatitis though no frank edema, correlate with pancreatic enzymes. Renal cysts bilaterally not requiring follow-up compatible with benign findings. Abundant stool in the colon.  Correlate with signs of constipation. These results will be called to the ordering clinician or representative by the Radiologist Assistant, and communication documented in the PACS or [employer]. Electronically Signed   By: Zetta Bills M.D.   On: 10/14/2021 12:18   DG ERCP  Result Date: 10/13/2021 CLINICAL DATA:  Choledocholithiasis. EXAM: ERCP COMPARISON:  ERCP, 10/10/2021.  CT AP, 10/09/2021 and 07/15/2014. FLUOROSCOPY TIME:  Fluoroscopy Time:   2 minutes 46 seconds Radiation Exposure Index (if provided by the fluoroscopic device): 16.7 mGy Number of Acquired Spot Images: 2 fluoroscopic cine loops, with 19 and 6 images respectively. FINDINGS: Multiple, limited oblique planar images of the RIGHT upper quadrant obtained C-arm. Images demonstrating flexible endoscopy, biliary duct cannulation, and partial retrograde cholangiogram. IMPRESSION: Fluoroscopic imaging for ERCP. For complete description of intra procedural findings, please see performing service dictation. Electronically Signed   By: Michaelle Birks M.D.   On: 10/13/2021 13:26   DG ERCP  Result Date: 10/10/2021 CLINICAL DATA:  Choledocholithiasis EXAM: ERCP COMPARISON:  CT AP, 10/09/2021 and 07/15/2014. FLUOROSCOPY TIME:  Fluoroscopy Time:  3 minutes 37 seconds Radiation Exposure Index (if provided by the fluoroscopic device): 25.1 mGy Number of Acquired Spot Images: Multiple fluoroscopic cine loops, each containing 13 17, 17, 16, 10, 7, 7, 3, 3 and 1 images FINDINGS: Multiple, limited oblique planar images of the RIGHT upper quadrant obtained C-arm. Images demonstrating flexible endoscopy, biliary duct cannulation, sphincterotomy, retrograde cholangiogram and balloon sweep. Common bile duct dilation is present, with at least 1 distal biliary filling defect appreciated. See key image. IMPRESSION: Fluoroscopic imaging for ERCP. Choledocholithiasis, with at least 1 distal biliary stone is demonstrated For complete description of intra procedural findings, please see performing service dictation. Electronically Signed   By: Michaelle Birks M.D.   On: 10/10/2021 19:10   DG C-Arm 1-60 Min-No Report  Result Date: 10/13/2021 Fluoroscopy was utilized by the requesting physician.  No radiographic interpretation.   MR ABDOMEN MRCP W WO CONTAST  Result Date: 10/14/2021 CLINICAL DATA:  Cholelithiasis and suspected choledocholithiasis in the setting of abdominal pain in this 71 year old female. EXAM: MRI  ABDOMEN WITHOUT AND WITH CONTRAST (INCLUDING MRCP) TECHNIQUE: Multiplanar multisequence MR imaging of the abdomen was performed both before and after the administration of intravenous contrast. Heavily T2-weighted images of the biliary and pancreatic ducts were obtained, and three-dimensional MRCP images were rendered by  post processing. CONTRAST:  68mL GADAVIST GADOBUTROL 1 MMOL/ML IV SOLN COMPARISON:  ERCP evaluation from 10/13/2020 and CT of the abdomen and pelvis from October 09, 2021. There are multiple prior CT evaluations as well for comparison comparison is also made with an exam from 2015. FINDINGS: Lower chest: Unremarkable to the extent evaluated on abdominal MRI. Hepatobiliary: No focal, suspicious hepatic lesion. Cyst in the anterior LEFT liver and small cysts in the RIGHT hemi liver. The portal vein is patent. Mild intra and extrahepatic biliary duct distension terminates at the level of the ampulla. Question of 3-4 mm filling defect in the distal common bile duct (image 178/[id number], reconstructed images. Common bile duct may be slightly less distended accounting for differences in modalities for comparison now at 6 as compared to 10 mm. Cholelithiasis, extensive cholelithiasis with sludge in stones layering dependently in the gallbladder many of these are radiopaque and were also seen on CT. Subtle pericholecystic fluid and mild pericholecystic stranding but no signs of wall thickening or hyperenhancement of the gallbladder wall. Pancreas: Pancreas with normal intrinsic T1 signal without peripancreatic inflammation. Top-normal caliber of the main pancreatic duct in the head of the pancreas and mild distension of the pancreatic duct in the body of the pancreas ranging between 3 and 4 mm. No substantial atrophy of pancreatic parenchyma. Pancreatic duct is grossly less dilated on recent comparison imaging from January of 2023 Spleen:  Unremarkable. Adrenals/Urinary Tract:  Adrenal glands are normal. Renal  cysts about the LEFT kidney largest in the upper pole 5.2 cm greatest axial dimension compatible with benign cyst, Bosniak criteria I. Small cyst in the upper pole of the RIGHT kidney measuring 7 mm. No hydronephrosis. No perinephric stranding. Stomach/Bowel: No acute gastrointestinal process to the extent evaluated on abdominal MRI, abundant stool in the colon. 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 Other: Trace pericholecystic fluid and small volume ascites. Mild body wall edema. Musculoskeletal: No suspicious bone lesions identified. IMPRESSION: Decreased caliber of the common bile duct accounting for differences in technique with single 3-4 mm sludge or small biliary calculus at the ampulla. This may reflect changes related to recently passed more substantial calculus, consider biliary enzymes trending for further assessment. Mild pericholecystic fluid in stranding without wall thickening or hyperenhancement. If there is continued concern for acute biliary process or cholecystitis HIDA scan may be helpful. Cholelithiasis as above with extensive gallstones and sludge in the dependent gallbladder. Mild distension of the pancreatic duct appears increased accounting for differences in comparison modality imaging, potentially related to recent ERCP or even mild pancreatitis though no frank edema, correlate with pancreatic enzymes. Renal cysts bilaterally not requiring follow-up compatible with benign findings. Abundant stool in the colon.  Correlate with signs of constipation. These results will be called to the ordering clinician or representative by the Radiologist Assistant, and communication documented in the PACS or [employer]. Electronically Signed   By: Zetta Bills M.D.   On: 10/14/2021 12:18    Microbiology: Recent Results (from the past 240 hour(s))  Resp Panel by RT-PCR (Flu A&B, Covid) Nasopharyngeal Swab     Status: None   Collection  Time: 10/09/21  3:58 PM   Specimen: Nasopharyngeal Swab; Nasopharyngeal(NP) swabs in vial transport medium  Result Value Ref Range Status   SARS Coronavirus 2 by RT PCR NEGATIVE NEGATIVE Final    Comment: (NOTE) SARS-CoV-2 target nucleic acids are NOT DETECTED.  The SARS-CoV-2 RNA is generally detectable in upper respiratory specimens during the acute phase  of infection. The lowest concentration of SARS-CoV-2 viral copies this assay can detect is 138 copies/mL. A negative result does not preclude SARS-Cov-2 infection and should not be used as the sole basis for treatment or other patient management decisions. A negative result may occur with  improper specimen collection/handling, submission of specimen other than nasopharyngeal swab, presence of viral mutation(s) within the areas targeted by this assay, and inadequate number of viral copies(<138 copies/mL). A negative result must be combined with clinical observations, patient history, and epidemiological information. The expected result is Negative.  Fact Sheet for Patients:  EntrepreneurPulse.com.au  Fact Sheet for Healthcare Providers:  IncredibleEmployment.be  This test is no t yet approved or cleared by the [place] FDA and  has been authorized for detection and/or diagnosis of SARS-CoV-2 by FDA under an Emergency Use Authorization (EUA). This EUA will remain  in effect (meaning this test can be used) for the duration of the COVID-19 declaration under Section 564(b)(1) of the Act, 21 U.S.C.section 360bbb-3(b)(1), unless the authorization is terminated  or revoked sooner.       Influenza A by PCR NEGATIVE NEGATIVE Final   Influenza B by PCR NEGATIVE NEGATIVE Final    Comment: (NOTE) The Xpert Xpress SARS-CoV-2/FLU/RSV plus assay is intended as an aid in the diagnosis of influenza from Nasopharyngeal swab specimens and should not be used as a sole basis for treatment. Nasal washings  and aspirates are unacceptable for Xpert Xpress SARS-CoV-2/FLU/RSV testing.  Fact Sheet for Patients: EntrepreneurPulse.com.au  Fact Sheet for Healthcare Providers: IncredibleEmployment.be  This test is not yet approved or cleared by the [place] FDA and has been authorized for detection and/or diagnosis of SARS-CoV-2 by FDA under an Emergency Use Authorization (EUA). This EUA will remain in effect (meaning this test can be used) for the duration of the COVID-19 declaration under Section 564(b)(1) of the Act, 21 U.S.C. section 360bbb-3(b)(1), unless the authorization is terminated or revoked.  Performed at [facility], [address], [place], [place] [address]   Surgical pcr screen     Status: Abnormal   Collection Time: 10/14/21  6:43 PM   Specimen: Nasal Mucosa; Nasal Swab  Result Value Ref Range Status   MRSA, PCR (A) NEGATIVE Final    INVALID, UNABLE TO DETERMINE THE PRESENCE OF TARGET DUE TO SPECIMEN INTEGRITY. RECOLLECTION REQUESTED.    Comment: RESULT CALLED TO, READ BACK BY AND VERIFIED WITH: [email or web address]  by Zigmund Daniel, b 1.24.23    Staphylococcus aureus (A) NEGATIVE Final    INVALID, UNABLE TO DETERMINE THE PRESENCE OF TARGET DUE TO SPECIMEN INTEGRITY. RECOLLECTION REQUESTED.    Comment: (NOTE) The Xpert SA Assay (FDA approved for NASAL specimens in patients 71 years of age and older), is one component of a comprehensive surveillance program. It is not intended to diagnose infection nor to guide or monitor treatment. Performed at [facility], [address], [place], [place] [address]   Surgical pcr screen     Status: Abnormal   Collection Time: 10/14/21 11:34 PM   Specimen: Nasal Mucosa; Nasal Swab  Result Value Ref Range Status   MRSA, PCR (A) NEGATIVE Final    INVALID, UNABLE TO DETERMINE THE PRESENCE OF TARGET DUE TO SPECIMEN INTEGRITY. RECOLLECTION REQUESTED.    Comment: RCRV [id number] B.MATTHEWS   Staphylococcus  aureus (A) NEGATIVE Final    INVALID, UNABLE TO DETERMINE THE PRESENCE OF TARGET DUE TO SPECIMEN INTEGRITY. RECOLLECTION REQUESTED.    Comment: RCRV [id number] B.MATTHEWS (NOTE) The Xpert SA Assay (FDA approved  for NASAL specimens in patients 69 years of age and older), is one component of a comprehensive surveillance program. It is not intended to diagnose infection nor to guide or monitor treatment. Performed at [facility], [address], [place], [place] [address]      Labs: Basic Metabolic Panel: Recent Labs  Lab 10/10/21 0413 10/11/21 0346 10/13/21 0339 10/14/21 0416 10/16/21 0436  NA 141 138 139 141 139  K 5.0 3.6 3.1* 3.6 3.1*  CL 111 106 110 111 104  CO2 23 23 26 24 27   GLUCOSE 107* 179* 98 97 137*  BUN 8 12 5* 6* 7*  CREATININE 1.08* 1.08* 0.81 0.79 0.76  CALCIUM 9.2 9.1 8.0* 8.1* 8.4*  MG  --   --   --   --  1.5*  PHOS  --   --   --   --  2.2*   Liver Function Tests: Recent Labs  Lab 10/10/21 0413 10/11/21 0346 10/13/21 0339 10/14/21 0416 10/16/21 0436  AST 24 33 30 22 53*  ALT 14 16 15 13 22   ALKPHOS 86 88 65 62 64  BILITOT 1.1 1.3* 1.1 0.8 0.8  PROT 6.7 7.1 5.6* 5.0* 5.7*  ALBUMIN 3.3* 3.5 2.8* 2.6* 3.1*   Recent Labs  Lab 10/09/21 1307 10/11/21 0346 10/13/21 0339 10/14/21 0416  LIPASE 23  --   --   --   AMYLASE  --  117* 64 65   No results for input(s): AMMONIA in the last 168 hours. CBC: Recent Labs  Lab 10/10/21 0413 10/11/21 0346 10/13/21 0339 10/14/21 0416 10/16/21 0436  WBC 12.3* 13.2* 8.7 7.5 12.5*  HGB 12.3 12.6 10.8* 10.0* 10.2*  HCT 39.0 39.4 35.2* 32.0* 32.1*  MCV 108.0* 107.9* 105.7* 106.3* 104.9*  PLT 231 230 190 167 172   Cardiac Enzymes: No results for input(s): CKTOTAL, CKMB, CKMBINDEX, TROPONINI in the last 168 hours. BNP: Invalid input(s): POCBNP CBG: No results for input(s): GLUCAP in the last 168 hours.  Time coordinating discharge:  36 minutes  Signed:  Orson Eva, DO [facility] Pager:  [phone] 10/16/2021, 8:57 AM

## 2021-10-16 NOTE — Progress Notes (Signed)
Nsg Discharge Note  Admit Date:  10/09/2021 Discharge date: 10/16/2021   BHAVIKA BIANCONI to be D/C'd Home per MD order.  AVS completed.  Copy for chart, and copy for patient signed, and dated. Patient/caregiver able to verbalize understanding.  Discharge Medication: Allergies as of 10/16/2021       Reactions   Penicillins Anaphylaxis   Aspirin Swelling, Other (See Comments)   Only in large doses will cause a reaction   Bee Venom Swelling   Severe swelling   Contrast Media [iodinated Contrast Media] Swelling   Dilaudid [hydromorphone Hcl] Itching   Lisinopril Swelling   Motrin [ibuprofen] Swelling        Medication List     STOP taking these medications    lubiprostone 24 MCG capsule Commonly known as: Amitiza   lubiprostone 8 MCG capsule Commonly known as: Amitiza   simvastatin 40 MG tablet Commonly known as: ZOCOR       TAKE these medications    albuterol 108 (90 Base) MCG/ACT inhaler Commonly known as: VENTOLIN HFA Inhale into the lungs.   amLODipine 10 MG tablet Commonly known as: NORVASC Take 10 mg by mouth daily.   aspirin EC 81 MG tablet Take 81 mg by mouth daily.   cloNIDine 0.3 MG tablet Commonly known as: CATAPRES Take 0.3 mg by mouth 2 (two) times daily.   dextromethorphan-guaiFENesin 30-600 MG 12hr tablet Commonly known as: MUCINEX DM Take 1 tablet by mouth 2 (two) times daily.   diphenhydrAMINE 25 MG tablet Commonly known as: BENADRYL Take 1 tablet (25 mg total) by mouth every 8 (eight) hours as needed (Tinnitus).   docusate sodium 100 MG capsule Commonly known as: COLACE Take 100 mg by mouth daily.   folic acid 1 MG tablet Commonly known as: FOLVITE Take 1 tablet (1 mg total) by mouth daily. Start taking on: October 17, 2021   HYDROcodone-acetaminophen 5-325 MG tablet Commonly known as: NORCO/VICODIN Take 1-2 tablets by mouth every 4 (four) hours as needed for moderate pain. What changed:  how much to take how to take  this when to take this reasons to take this additional instructions   Linzess 72 MCG capsule Generic drug: linaclotide Take 72 mcg by mouth daily.   metoprolol tartrate 50 MG tablet Commonly known as: LOPRESSOR Take 50 mg by mouth 2 (two) times daily.   omeprazole 40 MG capsule Commonly known as: PRILOSEC   polyethylene glycol powder 17 GM/SCOOP powder Commonly known as: GLYCOLAX/MIRALAX Take 17 g by mouth daily.   rosuvastatin 5 MG tablet Commonly known as: CRESTOR Take by mouth.   vitamin B-12 500 MCG tablet Commonly known as: CYANOCOBALAMIN Take 1 tablet (500 mcg total) by mouth daily.        Discharge Assessment: Vitals:   10/15/21 2036 10/16/21 0601  BP: (!) 150/60 (!) 143/66  Pulse: 93 87  Resp: 19 18  Temp: 98.9 F (37.2 C) 98.5 F (36.9 C)  SpO2: 95% 96%   Skin clean, dry and intact without evidence of skin break down, no evidence of skin tears noted. IV catheter discontinued intact. Site without signs and symptoms of complications - no redness or edema noted at insertion site, patient denies c/o pain - only slight tenderness at site.  Dressing with slight pressure applied.  D/c Instructions-Education: Discharge instructions given to patient/family with verbalized understanding. D/c education completed with patient/family including follow up instructions, medication list, d/c activities limitations if indicated, with other d/c instructions as indicated by MD - patient able  to verbalize understanding, all questions fully answered. Patient instructed to return to ED, call 911, or call MD for any changes in condition.  Patient escorted via [place], and D/C home via private auto.  Clovis Fredrickson, LPN [phone] AM

## 2021-10-23 ENCOUNTER — Ambulatory Visit (INDEPENDENT_AMBULATORY_CARE_PROVIDER_SITE_OTHER): Payer: Medicare Other | Admitting: General Surgery

## 2021-10-23 ENCOUNTER — Other Ambulatory Visit: Payer: Self-pay

## 2021-10-23 ENCOUNTER — Encounter: Payer: Self-pay | Admitting: General Surgery

## 2021-10-23 VITALS — BP 116/73 | HR 57 | Temp 98.6°F | Resp 12 | Ht 60.0 in | Wt 90.0 lb

## 2021-10-23 DIAGNOSIS — Z09 Encounter for follow-up examination after completed treatment for conditions other than malignant neoplasm: Secondary | ICD-10-CM

## 2021-10-24 NOTE — Progress Notes (Signed)
Subjective:     [name]  Patient here for follow-up, status post laparoscopic cholecystectomy, multiple ERCP attempts.  Patient states she is doing well.  She has minimal incisional pain.  Her appetite is improving.  She denies any preoperative right upper quadrant abdominal pain. Objective:    BP 116/73    Pulse (!) 57    Temp 98.6 F (37 C) (Other (Comment))    Resp 12    Ht 5' (1.524 m)    Wt 90 lb (40.8 kg)    SpO2 91%    BMI 17.58 kg/m   General:  alert, cooperative, and no distress  Abdomen soft, incisions healing well.     Assessment:    Doing well postoperatively.    Plan:   Follow-up here as needed.

## 2021-11-06 DIAGNOSIS — I10 Essential (primary) hypertension: Secondary | ICD-10-CM | POA: Diagnosis not present

## 2021-11-06 DIAGNOSIS — K810 Acute cholecystitis: Secondary | ICD-10-CM | POA: Diagnosis not present

## 2021-12-04 DIAGNOSIS — I10 Essential (primary) hypertension: Secondary | ICD-10-CM | POA: Diagnosis not present

## 2021-12-04 DIAGNOSIS — M1990 Unspecified osteoarthritis, unspecified site: Secondary | ICD-10-CM | POA: Diagnosis not present

## 2022-01-01 DIAGNOSIS — E46 Unspecified protein-calorie malnutrition: Secondary | ICD-10-CM | POA: Diagnosis not present

## 2022-01-01 DIAGNOSIS — I10 Essential (primary) hypertension: Secondary | ICD-10-CM | POA: Diagnosis not present

## 2022-01-01 DIAGNOSIS — G4700 Insomnia, unspecified: Secondary | ICD-10-CM | POA: Diagnosis not present

## 2022-01-05 DIAGNOSIS — H47291 Other optic atrophy, right eye: Secondary | ICD-10-CM | POA: Diagnosis not present

## 2022-01-05 DIAGNOSIS — H524 Presbyopia: Secondary | ICD-10-CM | POA: Diagnosis not present

## 2022-01-05 DIAGNOSIS — H5202 Hypermetropia, left eye: Secondary | ICD-10-CM | POA: Diagnosis not present

## 2022-01-05 DIAGNOSIS — H25813 Combined forms of age-related cataract, bilateral: Secondary | ICD-10-CM | POA: Diagnosis not present

## 2022-01-31 DIAGNOSIS — I10 Essential (primary) hypertension: Secondary | ICD-10-CM | POA: Diagnosis not present

## 2022-01-31 DIAGNOSIS — M1990 Unspecified osteoarthritis, unspecified site: Secondary | ICD-10-CM | POA: Diagnosis not present

## 2022-03-03 DIAGNOSIS — I2510 Atherosclerotic heart disease of native coronary artery without angina pectoris: Secondary | ICD-10-CM | POA: Diagnosis not present

## 2022-03-03 DIAGNOSIS — I10 Essential (primary) hypertension: Secondary | ICD-10-CM | POA: Diagnosis not present

## 2022-03-31 DIAGNOSIS — I10 Essential (primary) hypertension: Secondary | ICD-10-CM | POA: Diagnosis not present

## 2022-03-31 DIAGNOSIS — E46 Unspecified protein-calorie malnutrition: Secondary | ICD-10-CM | POA: Diagnosis not present

## 2022-03-31 DIAGNOSIS — I2510 Atherosclerotic heart disease of native coronary artery without angina pectoris: Secondary | ICD-10-CM | POA: Diagnosis not present

## 2022-05-01 DIAGNOSIS — I10 Essential (primary) hypertension: Secondary | ICD-10-CM | POA: Diagnosis not present

## 2022-05-01 DIAGNOSIS — E782 Mixed hyperlipidemia: Secondary | ICD-10-CM | POA: Diagnosis not present

## 2022-05-02 ENCOUNTER — Emergency Department (HOSPITAL_COMMUNITY): Payer: Medicare Other

## 2022-05-02 ENCOUNTER — Encounter (HOSPITAL_COMMUNITY): Payer: Self-pay | Admitting: Emergency Medicine

## 2022-05-02 ENCOUNTER — Inpatient Hospital Stay (HOSPITAL_COMMUNITY)
Admission: EM | Admit: 2022-05-02 | Discharge: 2022-05-09 | DRG: 177 | Disposition: A | Discharge status: Home Health | Payer: Medicare Other | Attending: Internal Medicine | Admitting: Internal Medicine

## 2022-05-02 ENCOUNTER — Other Ambulatory Visit: Payer: Self-pay

## 2022-05-02 DIAGNOSIS — I493 Ventricular premature depolarization: Secondary | ICD-10-CM | POA: Diagnosis present

## 2022-05-02 DIAGNOSIS — B3781 Candidal esophagitis: Secondary | ICD-10-CM | POA: Diagnosis not present

## 2022-05-02 DIAGNOSIS — R197 Diarrhea, unspecified: Secondary | ICD-10-CM | POA: Diagnosis not present

## 2022-05-02 DIAGNOSIS — L89151 Pressure ulcer of sacral region, stage 1: Secondary | ICD-10-CM | POA: Diagnosis not present

## 2022-05-02 DIAGNOSIS — D696 Thrombocytopenia, unspecified: Secondary | ICD-10-CM | POA: Diagnosis not present

## 2022-05-02 DIAGNOSIS — R109 Unspecified abdominal pain: Secondary | ICD-10-CM | POA: Diagnosis not present

## 2022-05-02 DIAGNOSIS — L8990 Pressure ulcer of unspecified site, unspecified stage: Secondary | ICD-10-CM | POA: Diagnosis not present

## 2022-05-02 DIAGNOSIS — K222 Esophageal obstruction: Secondary | ICD-10-CM | POA: Diagnosis not present

## 2022-05-02 DIAGNOSIS — E785 Hyperlipidemia, unspecified: Secondary | ICD-10-CM | POA: Diagnosis present

## 2022-05-02 DIAGNOSIS — U071 COVID-19: Principal | ICD-10-CM

## 2022-05-02 DIAGNOSIS — K209 Esophagitis, unspecified without bleeding: Secondary | ICD-10-CM | POA: Diagnosis not present

## 2022-05-02 DIAGNOSIS — R1319 Other dysphagia: Secondary | ICD-10-CM | POA: Diagnosis not present

## 2022-05-02 DIAGNOSIS — Z880 Allergy status to penicillin: Secondary | ICD-10-CM | POA: Diagnosis not present

## 2022-05-02 DIAGNOSIS — Z886 Allergy status to analgesic agent status: Secondary | ICD-10-CM

## 2022-05-02 DIAGNOSIS — K219 Gastro-esophageal reflux disease without esophagitis: Secondary | ICD-10-CM

## 2022-05-02 DIAGNOSIS — J439 Emphysema, unspecified: Secondary | ICD-10-CM | POA: Diagnosis not present

## 2022-05-02 DIAGNOSIS — Z888 Allergy status to other drugs, medicaments and biological substances status: Secondary | ICD-10-CM

## 2022-05-02 DIAGNOSIS — I252 Old myocardial infarction: Secondary | ICD-10-CM

## 2022-05-02 DIAGNOSIS — J9601 Acute respiratory failure with hypoxia: Secondary | ICD-10-CM | POA: Diagnosis present

## 2022-05-02 DIAGNOSIS — R112 Nausea with vomiting, unspecified: Secondary | ICD-10-CM

## 2022-05-02 DIAGNOSIS — E876 Hypokalemia: Secondary | ICD-10-CM | POA: Diagnosis present

## 2022-05-02 DIAGNOSIS — E860 Dehydration: Secondary | ICD-10-CM | POA: Diagnosis present

## 2022-05-02 DIAGNOSIS — Z79899 Other long term (current) drug therapy: Secondary | ICD-10-CM

## 2022-05-02 DIAGNOSIS — Z91041 Radiographic dye allergy status: Secondary | ICD-10-CM

## 2022-05-02 DIAGNOSIS — Z681 Body mass index (BMI) 19 or less, adult: Secondary | ICD-10-CM | POA: Diagnosis not present

## 2022-05-02 DIAGNOSIS — R1310 Dysphagia, unspecified: Secondary | ICD-10-CM | POA: Diagnosis not present

## 2022-05-02 DIAGNOSIS — R627 Adult failure to thrive: Secondary | ICD-10-CM | POA: Diagnosis not present

## 2022-05-02 DIAGNOSIS — N179 Acute kidney failure, unspecified: Secondary | ICD-10-CM | POA: Diagnosis not present

## 2022-05-02 DIAGNOSIS — R630 Anorexia: Secondary | ICD-10-CM | POA: Diagnosis present

## 2022-05-02 DIAGNOSIS — I2510 Atherosclerotic heart disease of native coronary artery without angina pectoris: Secondary | ICD-10-CM | POA: Diagnosis not present

## 2022-05-02 DIAGNOSIS — K529 Noninfective gastroenteritis and colitis, unspecified: Secondary | ICD-10-CM | POA: Diagnosis not present

## 2022-05-02 DIAGNOSIS — Z885 Allergy status to narcotic agent status: Secondary | ICD-10-CM

## 2022-05-02 DIAGNOSIS — Z8673 Personal history of transient ischemic attack (TIA), and cerebral infarction without residual deficits: Secondary | ICD-10-CM

## 2022-05-02 DIAGNOSIS — Z8249 Family history of ischemic heart disease and other diseases of the circulatory system: Secondary | ICD-10-CM

## 2022-05-02 DIAGNOSIS — K2289 Other specified disease of esophagus: Secondary | ICD-10-CM | POA: Diagnosis not present

## 2022-05-02 DIAGNOSIS — D649 Anemia, unspecified: Secondary | ICD-10-CM | POA: Diagnosis not present

## 2022-05-02 DIAGNOSIS — Z87891 Personal history of nicotine dependence: Secondary | ICD-10-CM | POA: Diagnosis not present

## 2022-05-02 DIAGNOSIS — I10 Essential (primary) hypertension: Secondary | ICD-10-CM | POA: Diagnosis not present

## 2022-05-02 DIAGNOSIS — Z91030 Bee allergy status: Secondary | ICD-10-CM

## 2022-05-02 DIAGNOSIS — Z955 Presence of coronary angioplasty implant and graft: Secondary | ICD-10-CM

## 2022-05-02 DIAGNOSIS — Z7982 Long term (current) use of aspirin: Secondary | ICD-10-CM

## 2022-05-02 DIAGNOSIS — R636 Underweight: Secondary | ICD-10-CM | POA: Diagnosis present

## 2022-05-02 DIAGNOSIS — R000 Tachycardia, unspecified: Secondary | ICD-10-CM | POA: Diagnosis not present

## 2022-05-02 DIAGNOSIS — H5461 Unqualified visual loss, right eye, normal vision left eye: Secondary | ICD-10-CM | POA: Diagnosis present

## 2022-05-02 LAB — RESP PANEL BY RT-PCR (FLU A&B, COVID) ARPGX2
Influenza A by PCR: NEGATIVE
Influenza B by PCR: NEGATIVE
SARS Coronavirus 2 by RT PCR: POSITIVE — AB

## 2022-05-02 LAB — CBC WITH DIFFERENTIAL/PLATELET
Abs Immature Granulocytes: 0.08 10*3/uL — ABNORMAL HIGH (ref 0.00–0.07)
Basophils Absolute: 0 10*3/uL (ref 0.0–0.1)
Basophils Relative: 0 %
Eosinophils Absolute: 0 10*3/uL (ref 0.0–0.5)
Eosinophils Relative: 0 %
HCT: 43.3 % (ref 36.0–46.0)
Hemoglobin: 13.8 g/dL (ref 12.0–15.0)
Immature Granulocytes: 1 %
Lymphocytes Relative: 9 %
Lymphs Abs: 1.2 10*3/uL (ref 0.7–4.0)
MCH: 31.8 pg (ref 26.0–34.0)
MCHC: 31.9 g/dL (ref 30.0–36.0)
MCV: 99.8 fL (ref 80.0–100.0)
Monocytes Absolute: 1.1 10*3/uL — ABNORMAL HIGH (ref 0.1–1.0)
Monocytes Relative: 9 %
Neutro Abs: 10.5 10*3/uL — ABNORMAL HIGH (ref 1.7–7.7)
Neutrophils Relative %: 81 %
Platelets: 147 10*3/uL — ABNORMAL LOW (ref 150–400)
RBC: 4.34 MIL/uL (ref 3.87–5.11)
RDW: 12.2 % (ref 11.5–15.5)
WBC: 12.9 10*3/uL — ABNORMAL HIGH (ref 4.0–10.5)
nRBC: 0 % (ref 0.0–0.2)

## 2022-05-02 LAB — COMPREHENSIVE METABOLIC PANEL
ALT: 47 U/L — ABNORMAL HIGH (ref 0–44)
AST: 91 U/L — ABNORMAL HIGH (ref 15–41)
Albumin: 3.7 g/dL (ref 3.5–5.0)
Alkaline Phosphatase: 116 U/L (ref 38–126)
Anion gap: 11 (ref 5–15)
BUN: 31 mg/dL — ABNORMAL HIGH (ref 8–23)
CO2: 26 mmol/L (ref 22–32)
Calcium: 9 mg/dL (ref 8.9–10.3)
Chloride: 102 mmol/L (ref 98–111)
Creatinine, Ser: 2.44 mg/dL — ABNORMAL HIGH (ref 0.44–1.00)
GFR, Estimated: 21 mL/min — ABNORMAL LOW (ref >60)
Glucose, Bld: 180 mg/dL — ABNORMAL HIGH (ref 70–99)
Potassium: 3.7 mmol/L (ref 3.5–5.1)
Sodium: 139 mmol/L (ref 135–145)
Total Bilirubin: 1 mg/dL (ref 0.3–1.2)
Total Protein: 8 g/dL (ref 6.5–8.1)

## 2022-05-02 LAB — URINALYSIS, ROUTINE W REFLEX MICROSCOPIC
Bilirubin Urine: NEGATIVE
Glucose, UA: NEGATIVE mg/dL
Ketones, ur: NEGATIVE mg/dL
Leukocytes,Ua: NEGATIVE
Nitrite: NEGATIVE
Protein, ur: >=300 mg/dL — AB
Specific Gravity, Urine: 1.014 (ref 1.005–1.030)
pH: 6 (ref 5.0–8.0)

## 2022-05-02 LAB — TROPONIN I (HIGH SENSITIVITY)
Troponin I (High Sensitivity): 34 ng/L — ABNORMAL HIGH (ref <18)
Troponin I (High Sensitivity): 30 ng/L — ABNORMAL HIGH (ref <18)

## 2022-05-02 LAB — LIPASE, BLOOD: Lipase: 346 U/L — ABNORMAL HIGH (ref 11–51)

## 2022-05-02 MED ORDER — LACTATED RINGERS IV SOLN: INTRAVENOUS | Status: DC

## 2022-05-02 MED ORDER — ENSURE ENLIVE PO LIQD: 237.0000 mL | Freq: Two times a day (BID) | ORAL | Status: DC

## 2022-05-02 MED ORDER — METHYLPREDNISOLONE SODIUM SUCC 40 MG IJ SOLR
40.0000 mg | Freq: Two times a day (BID) | INTRAMUSCULAR | Status: AC
  Administered 2022-05-02 – 2022-05-05 (×6): 40 mg via INTRAVENOUS
  Filled 2022-05-02 (×6): qty 1

## 2022-05-02 MED ORDER — HYDROCODONE-ACETAMINOPHEN 5-325 MG PO TABS
1.0000 | ORAL_TABLET | ORAL | Status: DC | PRN
  Administered 2022-05-07: 1 via ORAL
  Filled 2022-05-02: qty 1

## 2022-05-02 MED ORDER — METHYLPREDNISOLONE SODIUM SUCC 40 MG IJ SOLR
40.0000 mg | Freq: Two times a day (BID) | INTRAMUSCULAR | Status: DC
  Filled 2022-05-02: qty 1

## 2022-05-02 MED ORDER — ALBUTEROL SULFATE HFA 108 (90 BASE) MCG/ACT IN AERS: 2.0000 | INHALATION_SPRAY | RESPIRATORY_TRACT | Status: DC | PRN

## 2022-05-02 MED ORDER — TRAZODONE HCL 50 MG PO TABS: 50.0000 mg | ORAL_TABLET | Freq: Every evening | ORAL | Status: DC | PRN

## 2022-05-02 MED ORDER — IPRATROPIUM-ALBUTEROL 20-100 MCG/ACT IN AERS
1.0000 | INHALATION_SPRAY | Freq: Four times a day (QID) | RESPIRATORY_TRACT | Status: DC
  Administered 2022-05-02 – 2022-05-03 (×2): 1 via RESPIRATORY_TRACT
  Filled 2022-05-02: qty 4

## 2022-05-02 MED ORDER — ATORVASTATIN CALCIUM 20 MG PO TABS
20.0000 mg | ORAL_TABLET | Freq: Every day | ORAL | Status: DC
  Administered 2022-05-02 – 2022-05-04 (×3): 20 mg via ORAL
  Filled 2022-05-02 (×6): qty 1

## 2022-05-02 MED ORDER — PREDNISONE 20 MG PO TABS: 50.0000 mg | ORAL_TABLET | Freq: Every day | ORAL | Status: DC

## 2022-05-02 MED ORDER — PANTOPRAZOLE SODIUM 40 MG PO TBEC
40.0000 mg | DELAYED_RELEASE_TABLET | Freq: Every day | ORAL | Status: DC
  Administered 2022-05-02 – 2022-05-04 (×3): 40 mg via ORAL
  Filled 2022-05-02 (×5): qty 1

## 2022-05-02 MED ORDER — ACETAMINOPHEN 325 MG PO TABS: 650.0000 mg | ORAL_TABLET | Freq: Four times a day (QID) | ORAL | Status: DC | PRN

## 2022-05-02 MED ORDER — DIPHENHYDRAMINE HCL 25 MG PO CAPS: 50.0000 mg | ORAL_CAPSULE | Freq: Once | ORAL | Status: DC

## 2022-05-02 MED ORDER — SODIUM CHLORIDE 0.9% FLUSH
3.0000 mL | Freq: Two times a day (BID) | INTRAVENOUS | Status: DC
  Administered 2022-05-02 – 2022-05-09 (×11): 3 mL via INTRAVENOUS

## 2022-05-02 MED ORDER — LINACLOTIDE 72 MCG PO CAPS
72.0000 ug | ORAL_CAPSULE | Freq: Every day | ORAL | Status: DC
  Administered 2022-05-03 – 2022-05-04 (×2): 72 ug via ORAL
  Filled 2022-05-02 (×10): qty 1

## 2022-05-02 MED ORDER — SODIUM CHLORIDE 0.9 % IV SOLN: INTRAVENOUS | Status: DC | PRN

## 2022-05-02 MED ORDER — METHYLPREDNISOLONE SODIUM SUCC 40 MG IJ SOLR
40.0000 mg | Freq: Once | INTRAMUSCULAR | Status: AC
  Administered 2022-05-02: 40 mg via INTRAVENOUS
  Filled 2022-05-02: qty 1

## 2022-05-02 MED ORDER — FOLIC ACID 1 MG PO TABS
1.0000 mg | ORAL_TABLET | Freq: Every day | ORAL | Status: DC
  Administered 2022-05-02 – 2022-05-04 (×3): 1 mg via ORAL
  Filled 2022-05-02 (×6): qty 1

## 2022-05-02 MED ORDER — NIRMATRELVIR/RITONAVIR (PAXLOVID) TABLET (RENAL DOSING)
2.0000 | ORAL_TABLET | Freq: Two times a day (BID) | ORAL | Status: DC
  Filled 2022-05-02: qty 20

## 2022-05-02 MED ORDER — SODIUM CHLORIDE 0.9% FLUSH
3.0000 mL | Freq: Two times a day (BID) | INTRAVENOUS | Status: DC
  Administered 2022-05-02 – 2022-05-08 (×9): 3 mL via INTRAVENOUS

## 2022-05-02 MED ORDER — POLYETHYLENE GLYCOL 3350 17 G PO PACK
17.0000 g | PACK | Freq: Every day | ORAL | Status: DC
  Filled 2022-05-02: qty 1

## 2022-05-02 MED ORDER — ASPIRIN 81 MG PO TBEC
81.0000 mg | DELAYED_RELEASE_TABLET | Freq: Every day | ORAL | Status: DC
  Administered 2022-05-02 – 2022-05-04 (×3): 81 mg via ORAL
  Filled 2022-05-02 (×6): qty 1

## 2022-05-02 MED ORDER — LEVOFLOXACIN IN D5W 750 MG/150ML IV SOLN
750.0000 mg | INTRAVENOUS | Status: DC
Start: 2022-05-02 — End: 2022-05-03
  Administered 2022-05-02: 750 mg via INTRAVENOUS
  Filled 2022-05-02: qty 150

## 2022-05-02 MED ORDER — DIPHENHYDRAMINE HCL 25 MG PO TABS: 25.0000 mg | ORAL_TABLET | Freq: Three times a day (TID) | ORAL | Status: DC | PRN

## 2022-05-02 MED ORDER — ONDANSETRON 8 MG PO TBDP
8.0000 mg | ORAL_TABLET | Freq: Once | ORAL | Status: AC
  Administered 2022-05-02: 8 mg via ORAL
  Filled 2022-05-02: qty 1

## 2022-05-02 MED ORDER — ZINC SULFATE 220 (50 ZN) MG PO CAPS
220.0000 mg | ORAL_CAPSULE | Freq: Every day | ORAL | Status: DC
  Administered 2022-05-02 – 2022-05-04 (×3): 220 mg via ORAL
  Filled 2022-05-02 (×6): qty 1

## 2022-05-02 MED ORDER — ACETAMINOPHEN 650 MG RE SUPP
650.0000 mg | Freq: Four times a day (QID) | RECTAL | Status: DC | PRN
  Filled 2022-05-02: qty 1

## 2022-05-02 MED ORDER — DIPHENHYDRAMINE HCL 50 MG/ML IJ SOLN
50.0000 mg | Freq: Once | INTRAMUSCULAR | Status: DC
Start: 2022-05-02 — End: 2022-05-02

## 2022-05-02 MED ORDER — MOLNUPIRAVIR EUA 200MG CAPSULE
4.0000 | ORAL_CAPSULE | Freq: Two times a day (BID) | ORAL | Status: AC
  Administered 2022-05-02 – 2022-05-03 (×4): 800 mg via ORAL
  Filled 2022-05-02: qty 4

## 2022-05-02 MED ORDER — ONDANSETRON HCL 4 MG/2ML IJ SOLN: 4.0000 mg | Freq: Four times a day (QID) | INTRAMUSCULAR | Status: DC | PRN

## 2022-05-02 MED ORDER — SODIUM CHLORIDE 0.9% FLUSH
3.0000 mL | INTRAVENOUS | Status: DC | PRN
Start: 2022-05-02 — End: 2022-05-09

## 2022-05-02 MED ORDER — HEPARIN SODIUM (PORCINE) 5000 UNIT/ML IJ SOLN
5000.0000 [IU] | Freq: Three times a day (TID) | INTRAMUSCULAR | Status: DC
  Administered 2022-05-02 – 2022-05-09 (×17): 5000 [IU] via SUBCUTANEOUS
  Filled 2022-05-02 (×16): qty 1

## 2022-05-02 MED ORDER — GUAIFENESIN-DM 100-10 MG/5ML PO SYRP: 10.0000 mL | ORAL_SOLUTION | ORAL | Status: DC | PRN

## 2022-05-02 MED ORDER — SENNOSIDES-DOCUSATE SODIUM 8.6-50 MG PO TABS: 2.0000 | ORAL_TABLET | Freq: Every day | ORAL | Status: DC

## 2022-05-02 MED ORDER — SODIUM CHLORIDE 0.9 % IV BOLUS
500.0000 mL | Freq: Once | INTRAVENOUS | Status: AC
  Administered 2022-05-02: 500 mL via INTRAVENOUS

## 2022-05-02 MED ORDER — HYDRALAZINE HCL 20 MG/ML IJ SOLN: 10.0000 mg | Freq: Four times a day (QID) | INTRAMUSCULAR | Status: DC | PRN

## 2022-05-02 MED ORDER — ONDANSETRON HCL 4 MG PO TABS: 4.0000 mg | ORAL_TABLET | Freq: Four times a day (QID) | ORAL | Status: DC | PRN

## 2022-05-02 MED ORDER — ASCORBIC ACID 500 MG PO TABS
500.0000 mg | ORAL_TABLET | Freq: Every day | ORAL | Status: DC
Start: 2022-05-02 — End: 2022-05-09
  Administered 2022-05-04: 500 mg via ORAL
  Filled 2022-05-02 (×6): qty 1

## 2022-05-02 MED ORDER — METRONIDAZOLE 500 MG/100ML IV SOLN
500.0000 mg | Freq: Two times a day (BID) | INTRAVENOUS | Status: DC
  Administered 2022-05-02 – 2022-05-07 (×12): 500 mg via INTRAVENOUS
  Filled 2022-05-02 (×12): qty 100

## 2022-05-02 MED ORDER — DM-GUAIFENESIN ER 30-600 MG PO TB12
1.0000 | ORAL_TABLET | Freq: Two times a day (BID) | ORAL | Status: DC
  Administered 2022-05-02 – 2022-05-04 (×2): 1 via ORAL
  Filled 2022-05-02 (×7): qty 1

## 2022-05-02 MED ORDER — VITAMIN B-12 1000 MCG PO TABS
500.0000 ug | ORAL_TABLET | Freq: Every day | ORAL | Status: DC
  Administered 2022-05-02 – 2022-05-04 (×3): 500 ug via ORAL
  Filled 2022-05-02 (×6): qty 1

## 2022-05-02 MED ORDER — PREDNISONE 20 MG PO TABS
50.0000 mg | ORAL_TABLET | Freq: Every day | ORAL | Status: DC
  Filled 2022-05-02 (×2): qty 3

## 2022-05-02 MED ORDER — NIRMATRELVIR/RITONAVIR (PAXLOVID)TABLET
3.0000 | ORAL_TABLET | Freq: Two times a day (BID) | ORAL | Status: DC
  Filled 2022-05-02: qty 30

## 2022-05-02 MED ORDER — SODIUM CHLORIDE 0.9 % IV SOLN: INTRAVENOUS | Status: DC

## 2022-05-02 MED ORDER — HYDROCOD POLI-CHLORPHE POLI ER 10-8 MG/5ML PO SUER: 5.0000 mL | Freq: Two times a day (BID) | ORAL | Status: DC | PRN

## 2022-05-02 MED ORDER — ADULT MULTIVITAMIN W/MINERALS CH
1.0000 | ORAL_TABLET | Freq: Every day | ORAL | Status: DC
Start: 2022-05-02 — End: 2022-05-09
  Administered 2022-05-04: 1 via ORAL
  Filled 2022-05-02 (×6): qty 1

## 2022-05-02 MED ORDER — METOPROLOL TARTRATE 50 MG PO TABS
50.0000 mg | ORAL_TABLET | Freq: Two times a day (BID) | ORAL | Status: DC
  Administered 2022-05-02 – 2022-05-04 (×5): 50 mg via ORAL
  Filled 2022-05-02 (×6): qty 1

## 2022-05-02 NOTE — ED Notes (Signed)
Pt oxygen dropped as she slept. Nasal cannula applied at 2 lpm to keep above 90%.

## 2022-05-02 NOTE — ED Triage Notes (Addendum)
Pt with c/o abdominal pain, vomiting, and diarrhea x 3 days. States she cannot keep anything down.Pt also states she is dizzy and has been "for awhile".

## 2022-05-02 NOTE — Progress Notes (Signed)
   05/02/22 1625  Vitals  Temp 98.3 F (36.8 C)  Temp Source Oral  BP (!) 115/56  MAP (mmHg) 73  Pulse Rate 87  Resp 17  MEWS COLOR  MEWS Score Color Green  Oxygen Therapy  SpO2 97 %  O2 Device Room Air  MEWS Score  MEWS Temp 0  MEWS Systolic 0  MEWS Pulse 0  MEWS RR 0  MEWS LOC 0  MEWS Score 0   Patient complains of chest pain on her left side of her chest. Patient states it is constant and that this is normal for her. MD Emokpae notified.

## 2022-05-02 NOTE — ED Notes (Signed)
Patient transported to CT 

## 2022-05-02 NOTE — ED Notes (Signed)
Pt placed on 2lts [place] per nurse because Spo2 in 80s. Nurse notified

## 2022-05-02 NOTE — H&P (Signed)
Patient Demographics:    [name], is a 71 y.o. female  MRN: [id number]   DOB - 02/09/1951  Admit Date - 05/02/2022  Outpatient Primary MD for the patient is [name], April Baxter, MD   Assessment & Plan:   Assessment and Plan:  A/p 1)Acute hypoxic respiratory failure secondary to COVID-19 infection--- The treatment plan and use of medications  for treatment of COVID-19 infection and possible side effects were discussed with patient and her son [name] -O2 sats in the ED was 84-86 %, currently requiring 2 L of oxygen via nasal cannula to keep O2 sats above 90% -----Patient and son verbalizes understanding and agrees to treatment protocols   --Patient is positive for COVID-19 infection, patient is tachypneic/hypoxic and requiring continuous supplemental oxygen---patient meets criteria for initiation of Molnupiravir AND Steroid therapy per protocol  --Check and trend inflammatory markers including D-dimer, ferritin and  CRP---also follow CBC and CMP --Supplemental oxygen to keep O2 sats above 93% --- Encourage prone positioning for More than 16 hours/day in increments of 2 to 3 hours at a time if able to tolerate --Attempt to maintain euvolemic state --Zinc and vitamin C as ordered -Albuterol/Combivent inhaler as needed -PPI while on high-dose steroids  2)Acute Colitis--- -CT abdomen and pelvis without contrast--- suggest severe acute colitis predominantly involving the mid to distal transverse colon -Patient with leukocytosis, abdominal pain and vomiting and diarrhea -Empiric treatment with Levaquin and Flagyl due to penicillin allergy--- -???  COVID-related colitis versus bacterial colitis  3)H/o CAD/Prior MI--- no ACS type symptoms at this time --Troponin 34 , repeat 30 -EKG sinus rhythm with  PVCs -Continue metoprolol, aspirin and restart Crestor  4)AKI----acute kidney injury--suspect due to dehydration in the setting of diarrhea  --Creatinine is up to 2.44 from a baseline of between 0.7-0.8, anion gap is 11, bicarb is 26 --IV fluids as ordered --renally adjust medications, avoid nephrotoxic agents / dehydration  / hypotension  5)HTN--- hold amlodipine, hold clonidine... Due to soft BP -Continue metoprolol  6)H/o CVA--- no strokelike symptoms at this time continue aspirin and Crestor  7)COPD/Emphysema----no acute exacerbation at this time -COVID-related hypoxia as above #1 -Continue bronchodilators  8) elevated lipase---346 -CT abdomen and pelvis without abnormalities of the pancreas -Clinically presentation not consistent with acute pancreatitis -Continue to monitor closely   Disposition/Need for in-Hospital Stay- patient unable to be discharged at this time due to -COVID induced acute hypoxic respiratory failure requiring supplemental oxygen, IV steroids -Dehydration/AKI due to colitis/diarrhea requiring IV fluids*  Status is: Inpatient  Remains inpatient appropriate because:   Dispo: The patient is from: Home              Anticipated d/c is to: Home              Anticipated d/c date is: 2 days              Patient currently is not medically stable to d/c. 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 Colonic diverticulosis. Inadequate prepartation precluded examination of all the rectal and colonic.  I suspect slow colonic transit in the setting of a markely redundant colon.   CORONARY ANGIOPLASTY WITH STENT PLACEMENT  [id number]   cyst removed from left wrist     ERCP N/A 10/10/2021   Procedure: ENDOSCOPIC RETROGRADE CHOLANGIOPANCREATOGRAPHY (ERCP);  Surgeon: Rogene Houston, MD;  Location: [facility];  Service: Gastroenterology;  Laterality: N/A;   ERCP N/A 10/13/2021   Procedure: ENDOSCOPIC RETROGRADE CHOLANGIOPANCREATOGRAPHY (ERCP);  Surgeon: Rogene Houston, MD;  Location: [facility];  Service: Gastroenterology;  Laterality: N/A;   ESOPHAGOGASTRODUODENOSCOPY  09/05/2004   Dr Chucky May gastritis, candida esophagitis   ESOPHAGOGASTRODUODENOSCOPY  09/07/2011   [id number], esophageal in the distal esophagus/Mild gastritis   Lumps removed from each breast     SPHINCTEROTOMY N/A 10/10/2021   Procedure: SPHINCTEROTOMY INCOMPLETE, STONE NOT REMOVED;  Surgeon: Rogene Houston, MD;  Location: [facility];  Service: Gastroenterology;  Laterality: N/A;    Chief Complaint  Patient presents with   Emesis      HPI:    [name]  is a 71 y.o. female with PMH of CVA, HLD, HTN , poor visual acuity, history of CAD/prior MI, GERD and depressive disorder presents to the ED with abdominal pain nausea vomiting and diarrhea for the last week or so worse over the last couple days -Subjective fevers, chills, myalgias and fatigue as well as malaise Additional history obtained from patient's son [name] at bedside -No headaches no speech or gait concerns -No chest pains no palpitations -Patient has had generalized weakness and occasional dizzy spells -UA is not consistent with a UTI -WBC is 12.9, HGB is 13.8 platelets 147 -COVID-positive, flu  negative -Troponin 34 , repeat 30, -EKG sinus rhythm with PVCs -Lipase is 346 -Creatinine is up to 2.44 from a baseline of between 0.7-0.8, anion gap is 11, bicarb is 26 -Chest x-ray with COPD/emphysema -CT abdomen and pelvis without contrast--- suggest severe acute colitis predominantly involving the mid to distal transverse colon   Review of systems:    In addition to the HPI above,   A full Review of  Systems was done, all other systems reviewed are negative except as noted above in HPI , .   Social History:  Reviewed by me    Social History   Tobacco Use   Smoking status: Former    Packs/day: 0.25    Years: 40.00    Total pack years: 10.00    Types: Cigarettes    Quit date: 08/02/2017    Years since quitting: 4.7   Smokeless tobacco: Never  Substance Use Topics   Alcohol use: No  Alcohol/week: 0.0 standard drinks of alcohol    Family History :  Reviewed by me    Family History  Problem Relation Age of Onset   Multiple sclerosis Father    Alzheimer's disease Mother    Hypertension Mother    Diabetes Mother    Colon cancer Neg Hx     Home Medications:   Prior to Admission medications   Medication Sig Start Date End Date Taking? Authorizing Provider  albuterol (VENTOLIN HFA) 108 (90 Base) MCG/ACT inhaler Inhale into the lungs.    [provider]  amLODipine (NORVASC) 10 MG tablet Take 10 mg by mouth daily. 08/10/21   [provider]  aspirin EC 81 MG tablet Take 81 mg by mouth daily.      [provider]  cloNIDine (CATAPRES) 0.3 MG tablet Take 0.3 mg by mouth 2 (two) times daily. 08/12/21   [provider]  dextromethorphan-guaiFENesin (MUCINEX DM) 30-600 MG per 12 hr tablet Take 1 tablet by mouth 2 (two) times daily.    [provider]  diphenhydrAMINE (BENADRYL) 25 MG tablet Take 1 tablet (25 mg total) by mouth every 8 (eight) hours as needed (Tinnitus). 01/08/18   Davonna Belling, MD  docusate sodium (COLACE)  100 MG capsule Take 100 mg by mouth daily.      [provider]  folic acid (FOLVITE) 1 MG tablet Take 1 tablet (1 mg total) by mouth daily. 10/17/21   Orson Eva, MD  HYDROcodone-acetaminophen (NORCO/VICODIN) 5-325 MG tablet Take 1-2 tablets by mouth every 4 (four) hours as needed for moderate pain. 10/16/21   Orson Eva, MD  LINZESS 72 MCG capsule Take 72 mcg by mouth daily. 09/16/21   [provider]  metoprolol tartrate (LOPRESSOR) 50 MG tablet Take 50 mg by mouth 2 (two) times daily. 08/10/21   [provider]  omeprazole (PRILOSEC) 40 MG capsule  10/20/19   [provider]  polyethylene glycol powder (GLYCOLAX/MIRALAX) powder Take 17 g by mouth daily.    [provider]  rosuvastatin (CRESTOR) 5 MG tablet Take by mouth.    [provider]  vitamin B-12 (CYANOCOBALAMIN) 500 MCG tablet Take 1 tablet (500 mcg total) by mouth daily. 10/16/21   Orson Eva, MD     Allergies:     Allergies  Allergen Reactions   Penicillins Anaphylaxis   Aspirin Swelling and Other (See Comments)    Only in large doses will cause a reaction   Bee Venom Swelling    Severe swelling   Contrast Media [Iodinated Contrast Media] Swelling   Dilaudid [Hydromorphone Hcl] Itching   Lisinopril Swelling   Motrin [Ibuprofen] Swelling     Physical Exam:   Vitals  Blood pressure (!) 142/66, pulse (!) 109, temperature 98.3 F (36.8 C), temperature source Oral, resp. rate 18, height 5' (1.524 m), weight 40 kg, SpO2 100 %.  Physical Examination: General appearance - alert,  in no distress  Mental status - alert, oriented to person, place, and time,  Eyes - sclera anicteric, poor visual acuity Nose- [place] 2L/min Neck - supple, no JVD elevation , Chest -fair air movement, no wheezing  heart - S1 and S2 normal, regular  Abdomen - soft,  nondistended, +BS, abdominal discomfort on palpation no rebound or guarding, no CVA area tenderness Neurological - screening mental  status exam normal, neck supple without rigidity, cranial nerves II through XII intact, DTR's normal and symmetric Extremities - no pedal edema noted, intact peripheral pulses  Skin - warm,  dry   Data Review:    CBC Recent Labs  Lab 05/02/22 0705  WBC 12.9*  HGB 13.8  HCT 43.3  PLT 147*  MCV 99.8  MCH 31.8  MCHC 31.9  RDW 12.2  LYMPHSABS 1.2  MONOABS 1.1*  EOSABS 0.0  BASOSABS 0.0   ------------------------------------------------------------------------------------------------------------------  Chemistries  Recent Labs  Lab 05/02/22 0705  NA 139  K 3.7  CL 102  CO2 26  GLUCOSE 180*  BUN 31*  CREATININE 2.44*  CALCIUM 9.0  AST 91*  ALT 47*  ALKPHOS 116  BILITOT 1.0   ------------------------------------------------------------------------------------------------------------------ estimated creatinine clearance is 13.2 mL/min (A) (by C-G formula based on SCr of 2.44 mg/dL (H)). ------------------------------------------------------------------------------------------------------------------ ---------------------------------------------------------------------------------------------------------------  Urinalysis    Component Value Date/Time   COLORURINE YELLOW 05/02/2022 0711   APPEARANCEUR HAZY (A) 05/02/2022 0711   LABSPEC 1.014 05/02/2022 0711   PHURINE 6.0 05/02/2022 0711   GLUCOSEU NEGATIVE 05/02/2022 0711   HGBUR SMALL (A) 05/02/2022 0711   BILIRUBINUR NEGATIVE 05/02/2022 0711   KETONESUR NEGATIVE 05/02/2022 0711   PROTEINUR >=300 (A) 05/02/2022 0711   UROBILINOGEN 1.0 07/16/2014 0244   NITRITE NEGATIVE 05/02/2022 0711   LEUKOCYTESUR NEGATIVE 05/02/2022 0711    ----------------------------------------------------------------------------------------------------------------   Imaging Results:    DG Chest 2 View  Result Date: 05/02/2022 CLINICAL DATA:  71 year old female with history of abdominal pain, vomiting and diarrhea. EXAM: CHEST - 2  VIEW COMPARISON:  Chest x-ray 10/09/2021. FINDINGS: Lung volumes are increased with diffuse emphysematous changes. No consolidative airspace disease. No pleural effusions. No pneumothorax. No pulmonary nodule or mass noted. Pulmonary vasculature and the cardiomediastinal silhouette are within normal limits. Atherosclerosis in the thoracic aorta. IMPRESSION: 1. No radiographic evidence of acute cardiopulmonary disease. 2. Emphysema. 3. Aortic atherosclerosis. Electronically Signed   By: Trudie Reed M.D.   On: 05/02/2022 08:51   CT ABDOMEN PELVIS WO CONTRAST  Result Date: 05/02/2022 CLINICAL DATA:  71 year old female with history of acute onset of nonlocalized abdominal pain, vomiting and diarrhea for the past 3 days. EXAM: CT ABDOMEN AND PELVIS WITHOUT CONTRAST TECHNIQUE: Multidetector CT imaging of the abdomen and pelvis was performed following the standard protocol without IV contrast. RADIATION DOSE REDUCTION: This exam was performed according to the departmental dose-optimization program which includes automated exposure control, adjustment of the mA and/or kV according to patient size and/or use of iterative reconstruction technique. COMPARISON:  CT of the abdomen and pelvis 10/09/2021. FINDINGS: Lower chest: Diffuse bronchial wall thickening and emphysematous changes in the lung bases bilaterally. Small right-sided Bochdalek's hernia. Scarring in the lung bases bilaterally. Atherosclerotic calcifications in the descending thoracic aorta as well as the left anterior descending, left circumflex and right coronary arteries. Hepatobiliary: There are low-attenuation lesions in the liver, incompletely characterized on today's noncontrast CT examination, previously characterized as simple cysts on prior abdominal MRI 10/14/2021 (no imaging follow-up is recommended), measuring up to 1.3 cm in segment 4A. No other suspicious appearing hepatic lesions are noted. Pancreas: No definite pancreatic mass or  peripancreatic fluid collections or inflammatory changes are noted on today's noncontrast CT examination. Spleen: Unremarkable. Adrenals/Urinary Tract: Large low-attenuation lesion measuring 5.4 cm in the upper pole of the left kidney, and smaller 1.2 cm low-attenuation lesion in the interpolar region of the left kidney, incompletely characterized on today's noncontrast CT examination, but previously characterized as simple cysts on MRI 10/14/2021 (no imaging follow-up is recommended). Right kidney and bilateral adrenal glands are unremarkable in appearance. No hydroureteronephrosis. Urinary bladder is unremarkable in appearance. Stomach/Bowel: The appearance  of the stomach is normal. There is no pathologic dilatation of small bowel or colon. There is profound mural thickening and extensive surrounding inflammatory changes associated with the mid and distal transverse colon, concerning for severe acute colitis. The appendix is not confidently identified and may be surgically absent. Regardless, there are no inflammatory changes noted adjacent to the cecum to suggest the presence of an acute appendicitis at this time. Vascular/Lymphatic: Atherosclerotic calcifications throughout the abdominal aorta and pelvic vasculature. No definite lymphadenopathy confidently identified in the abdomen or pelvis on today's noncontrast CT examination. Reproductive: Status post hysterectomy. Ovaries are not confidently identified may be surgically absent or atrophic. Other: No significant volume of ascites.  No pneumoperitoneum. Musculoskeletal: There are no aggressive appearing lytic or blastic lesions noted in the visualized portions of the skeleton. IMPRESSION: 1. Findings suggest severe acute colitis, predominantly involving the mid to distal transverse colon. 2. Aortic atherosclerosis, in addition to at least three-vessel coronary artery disease. Assessment for potential risk factor modification, dietary therapy or pharmacologic  therapy may be warranted, if clinically indicated. 3. Emphysema. 4. Additional incidental findings, as above. Electronically Signed   By: Trudie Reed M.D.   On: 05/02/2022 08:50    Radiological Exams on Admission: DG Chest 2 View  Result Date: 05/02/2022 CLINICAL DATA:  71 year old female with history of abdominal pain, vomiting and diarrhea. EXAM: CHEST - 2 VIEW COMPARISON:  Chest x-ray 10/09/2021. FINDINGS: Lung volumes are increased with diffuse emphysematous changes. No consolidative airspace disease. No pleural effusions. No pneumothorax. No pulmonary nodule or mass noted. Pulmonary vasculature and the cardiomediastinal silhouette are within normal limits. Atherosclerosis in the thoracic aorta. IMPRESSION: 1. No radiographic evidence of acute cardiopulmonary disease. 2. Emphysema. 3. Aortic atherosclerosis. Electronically Signed   By: Trudie Reed M.D.   On: 05/02/2022 08:51   CT ABDOMEN PELVIS WO CONTRAST  Result Date: 05/02/2022 CLINICAL DATA:  71 year old female with history of acute onset of nonlocalized abdominal pain, vomiting and diarrhea for the past 3 days. EXAM: CT ABDOMEN AND PELVIS WITHOUT CONTRAST TECHNIQUE: Multidetector CT imaging of the abdomen and pelvis was performed following the standard protocol without IV contrast. RADIATION DOSE REDUCTION: This exam was performed according to the departmental dose-optimization program which includes automated exposure control, adjustment of the mA and/or kV according to patient size and/or use of iterative reconstruction technique. COMPARISON:  CT of the abdomen and pelvis 10/09/2021. FINDINGS: Lower chest: Diffuse bronchial wall thickening and emphysematous changes in the lung bases bilaterally. Small right-sided Bochdalek's hernia. Scarring in the lung bases bilaterally. Atherosclerotic calcifications in the descending thoracic aorta as well as the left anterior descending, left circumflex and right coronary arteries. Hepatobiliary:  There are low-attenuation lesions in the liver, incompletely characterized on today's noncontrast CT examination, previously characterized as simple cysts on prior abdominal MRI 10/14/2021 (no imaging follow-up is recommended), measuring up to 1.3 cm in segment 4A. No other suspicious appearing hepatic lesions are noted. Pancreas: No definite pancreatic mass or peripancreatic fluid collections or inflammatory changes are noted on today's noncontrast CT examination. Spleen: Unremarkable. Adrenals/Urinary Tract: Large low-attenuation lesion measuring 5.4 cm in the upper pole of the left kidney, and smaller 1.2 cm low-attenuation lesion in the interpolar region of the left kidney, incompletely characterized on today's noncontrast CT examination, but previously characterized as simple cysts on MRI 10/14/2021 (no imaging follow-up is recommended). Right kidney and bilateral adrenal glands are unremarkable in appearance. No hydroureteronephrosis. Urinary bladder is unremarkable in appearance. Stomach/Bowel: The appearance of the stomach is  normal. There is no pathologic dilatation of small bowel or colon. There is profound mural thickening and extensive surrounding inflammatory changes associated with the mid and distal transverse colon, concerning for severe acute colitis. The appendix is not confidently identified and may be surgically absent. Regardless, there are no inflammatory changes noted adjacent to the cecum to suggest the presence of an acute appendicitis at this time. Vascular/Lymphatic: Atherosclerotic calcifications throughout the abdominal aorta and pelvic vasculature. No definite lymphadenopathy confidently identified in the abdomen or pelvis on today's noncontrast CT examination. Reproductive: Status post hysterectomy. Ovaries are not confidently identified may be surgically absent or atrophic. Other: No significant volume of ascites.  No pneumoperitoneum. Musculoskeletal: There are no aggressive  appearing lytic or blastic lesions noted in the visualized portions of the skeleton. IMPRESSION: 1. Findings suggest severe acute colitis, predominantly involving the mid to distal transverse colon. 2. Aortic atherosclerosis, in addition to at least three-vessel coronary artery disease. Assessment for potential risk factor modification, dietary therapy or pharmacologic therapy may be warranted, if clinically indicated. 3. Emphysema. 4. Additional incidental findings, as above. Electronically Signed   By: Vinnie Langton M.D.   On: 05/02/2022 08:50    DVT Prophylaxis -SCD/heparin AM Labs Ordered, also please review Full Orders  Family Communication: Admission, patients condition and plan of care including tests being ordered have been discussed with the patient and son [name] who indicate understanding and agree with the plan   Condition  -stable  Roxan Hockey M.D on 05/02/2022 at 3:03 PM Go to [email or web address] -  for contact info  [facility] - Office  [phone]

## 2022-05-02 NOTE — ED Notes (Signed)
Pt dressed in gown and stand-by assist to bedside commode. Pt placed back on cardiac monitor. Nurse notified

## 2022-05-02 NOTE — ED Provider Notes (Signed)
Emergency Department Provider Note   I have reviewed the triage vital signs and the nursing notes.   HISTORY  Chief Complaint Emesis   HPI [name] is a 71 y.o. female with PMH of CVA, HLD, HTN presents to the emergency department for evaluation of abdominal pain with vomiting and diarrhea.  Patient tells me that symptoms been going on for "a while."  She estimates "weeks" but cannot give a specific start date/time.  Denies any fever.  No blood in the emesis or stool.  She does feel pain into her chest which she describes as "different" but has difficulty providing an exact description.  Triage note describes 3 days of symptoms.  Patient tells me that she has not been able to keep food down but will keep down fluids.  She has been feeling lightheaded and weak as well. No UTI symptoms.    Past Medical History:  Diagnosis Date   Anxiety    Blind    right   Colitis 09/07/2011   CVA (cerebral infarction)    GERD (gastroesophageal reflux disease)    Hyperlipidemia    Hypertension    Myocardial infarct (HCC)    1997, 2004   Severe major depression with psychotic features (HCC)    2004   Stroke [facility]) 2002    Review of Systems  Constitutional: No fever/chills. Positive generalized weakness.  Eyes: No visual changes. ENT: No sore throat. Cardiovascular: Denies chest pain. Respiratory: Denies shortness of breath. Gastrointestinal: Positive abdominal pain. Positive nausea, vomiting, and diarrhea.  No constipation. Genitourinary: Negative for dysuria. Musculoskeletal: Negative for back pain. Skin: Negative for rash. Neurological: Negative for headaches, focal weakness or numbness.   ____________________________________________   PHYSICAL EXAM:  VITAL SIGNS: ED Triage Vitals  Enc Vitals Group     BP 05/02/22 0633 (!) 116/54     Pulse Rate 05/02/22 0633 (!) 103     Resp 05/02/22 0633 (!) 22     Temp 05/02/22 0633 99 F (37.2 C)     Temp Source 05/02/22 0633  Oral     SpO2 05/02/22 0633 94 %     Weight 05/02/22 0628 88 lb 2.9 oz (40 kg)     Height 05/02/22 0628 5' (1.524 m)   Constitutional: Alert and oriented. Well appearing and in no acute distress. Eyes: Conjunctivae are normal.  Head: Atraumatic. Nose: No congestion/rhinnorhea. Mouth/Throat: Mucous membranes are slightly dry.  Neck: No stridor.  Cardiovascular: Mild tachycardia. Good peripheral circulation. Grossly normal heart sounds.   Respiratory: Normal respiratory effort.  No retractions. Lungs CTAB. Gastrointestinal: Soft with epigastric tenderness. No distention.  Musculoskeletal: No lower extremity tenderness nor edema. No gross deformities of extremities. Neurologic:  Normal speech and language. No gross focal neurologic deficits are appreciated.  Skin:  Skin is warm, dry and intact. No rash noted.  ____________________________________________   LABS (all labs ordered are listed, but only abnormal results are displayed)  Labs Reviewed  RESP PANEL BY RT-PCR (FLU A&B, COVID) ARPGX2 - Abnormal; Notable for the following components:      Result Value   SARS Coronavirus 2 by RT PCR POSITIVE (*)    All other components within normal limits  LIPASE, BLOOD - Abnormal; Notable for the following components:   Lipase 346 (*)    All other components within normal limits  COMPREHENSIVE METABOLIC PANEL - Abnormal; Notable for the following components:   Glucose, Bld 180 (*)    BUN 31 (*)    Creatinine, Ser 2.44 (*)  AST 91 (*)    ALT 47 (*)    GFR, Estimated 21 (*)    All other components within normal limits  URINALYSIS, ROUTINE W REFLEX MICROSCOPIC - Abnormal; Notable for the following components:   APPearance HAZY (*)    Hgb urine dipstick SMALL (*)    Protein, ur >=300 (*)    Bacteria, UA RARE (*)    All other components within normal limits  CBC WITH DIFFERENTIAL/PLATELET - Abnormal; Notable for the following components:   WBC 12.9 (*)    Platelets 147 (*)    Neutro  Abs 10.5 (*)    Monocytes Absolute 1.1 (*)    Abs Immature Granulocytes 0.08 (*)    All other components within normal limits  TROPONIN I (HIGH SENSITIVITY) - Abnormal; Notable for the following components:   Troponin I (High Sensitivity) 34 (*)    All other components within normal limits  TROPONIN I (HIGH SENSITIVITY)   ____________________________________________  EKG   EKG Interpretation  Date/Time:  Saturday May 02 2022 06:34:50 EDT Ventricular Rate:  102 PR Interval:  129 QRS Duration: 100 QT Interval:  298 QTC Calculation: 389 R Axis:   82 Text Interpretation: Sinus tachycardia Paired ventricular premature complexes Aberrant complex Borderline right axis deviation Low voltage, extremity and precordial leads Borderline repolarization abnormality Confirmed by Nanda Quinton [id number]) on 05/02/2022 7:22:28 AM        ____________________________________________  RADIOLOGY  DG Chest 2 View  Result Date: 05/02/2022 CLINICAL DATA:  71 year old female with history of abdominal pain, vomiting and diarrhea. EXAM: CHEST - 2 VIEW COMPARISON:  Chest x-ray 10/09/2021. FINDINGS: Lung volumes are increased with diffuse emphysematous changes. No consolidative airspace disease. No pleural effusions. No pneumothorax. No pulmonary nodule or mass noted. Pulmonary vasculature and the cardiomediastinal silhouette are within normal limits. Atherosclerosis in the thoracic aorta. IMPRESSION: 1. No radiographic evidence of acute cardiopulmonary disease. 2. Emphysema. 3. Aortic atherosclerosis. Electronically Signed   By: Vinnie Langton M.D.   On: 05/02/2022 08:51   CT ABDOMEN PELVIS WO CONTRAST  Result Date: 05/02/2022 CLINICAL DATA:  71 year old female with history of acute onset of nonlocalized abdominal pain, vomiting and diarrhea for the past 3 days. EXAM: CT ABDOMEN AND PELVIS WITHOUT CONTRAST TECHNIQUE: Multidetector CT imaging of the abdomen and pelvis was performed following the standard  protocol without IV contrast. RADIATION DOSE REDUCTION: This exam was performed according to the departmental dose-optimization program which includes automated exposure control, adjustment of the mA and/or kV according to patient size and/or use of iterative reconstruction technique. COMPARISON:  CT of the abdomen and pelvis 10/09/2021. FINDINGS: Lower chest: Diffuse bronchial wall thickening and emphysematous changes in the lung bases bilaterally. Small right-sided Bochdalek's hernia. Scarring in the lung bases bilaterally. Atherosclerotic calcifications in the descending thoracic aorta as well as the left anterior descending, left circumflex and right coronary arteries. Hepatobiliary: There are low-attenuation lesions in the liver, incompletely characterized on today's noncontrast CT examination, previously characterized as simple cysts on prior abdominal MRI 10/14/2021 (no imaging follow-up is recommended), measuring up to 1.3 cm in segment 4A. No other suspicious appearing hepatic lesions are noted. Pancreas: No definite pancreatic mass or peripancreatic fluid collections or inflammatory changes are noted on today's noncontrast CT examination. Spleen: Unremarkable. Adrenals/Urinary Tract: Large low-attenuation lesion measuring 5.4 cm in the upper pole of the left kidney, and smaller 1.2 cm low-attenuation lesion in the interpolar region of the left kidney, incompletely characterized on today's noncontrast CT examination, but previously characterized as simple  cysts on MRI 10/14/2021 (no imaging follow-up is recommended). Right kidney and bilateral adrenal glands are unremarkable in appearance. No hydroureteronephrosis. Urinary bladder is unremarkable in appearance. Stomach/Bowel: The appearance of the stomach is normal. There is no pathologic dilatation of small bowel or colon. There is profound mural thickening and extensive surrounding inflammatory changes associated with the mid and distal transverse colon,  concerning for severe acute colitis. The appendix is not confidently identified and may be surgically absent. Regardless, there are no inflammatory changes noted adjacent to the cecum to suggest the presence of an acute appendicitis at this time. Vascular/Lymphatic: Atherosclerotic calcifications throughout the abdominal aorta and pelvic vasculature. No definite lymphadenopathy confidently identified in the abdomen or pelvis on today's noncontrast CT examination. Reproductive: Status post hysterectomy. Ovaries are not confidently identified may be surgically absent or atrophic. Other: No significant volume of ascites.  No pneumoperitoneum. Musculoskeletal: There are no aggressive appearing lytic or blastic lesions noted in the visualized portions of the skeleton. IMPRESSION: 1. Findings suggest severe acute colitis, predominantly involving the mid to distal transverse colon. 2. Aortic atherosclerosis, in addition to at least three-vessel coronary artery disease. Assessment for potential risk factor modification, dietary therapy or pharmacologic therapy may be warranted, if clinically indicated. 3. Emphysema. 4. Additional incidental findings, as above. Electronically Signed   By: Vinnie Langton M.D.   On: 05/02/2022 08:50    ____________________________________________   PROCEDURES  Procedure(s) performed:   Procedures  None  ____________________________________________   INITIAL IMPRESSION / ASSESSMENT AND PLAN / ED COURSE  Pertinent labs & imaging results that were available during my care of the patient were reviewed by me and considered in my medical decision making (see chart for details).   This patient is Presenting for Evaluation of abdominal pain, which does require a range of treatment options, and is a complaint that involves a high risk of morbidity and mortality.  The Differential Diagnoses includes but is not exclusive to acute cholecystitis, intrathoracic causes for epigastric  abdominal pain, gastritis, duodenitis, pancreatitis, small bowel or large bowel obstruction, abdominal aortic aneurysm, hernia, gastritis, etc.   Critical Interventions-    Medications  lactated ringers infusion (has no administration in time range)  ondansetron (ZOFRAN-ODT) disintegrating tablet 8 mg (8 mg Oral Given 05/02/22 0710)  sodium chloride 0.9 % bolus 500 mL (0 mLs Intravenous Stopped 05/02/22 0836)  methylPREDNISolone sodium succinate (SOLU-MEDROL) 40 mg/mL injection 40 mg (40 mg Intravenous Given 05/02/22 0725)    Reassessment after intervention: No vomiting in the ED.    I decided to review pertinent External Data, and in summary patient s/p cholecystectomy.    Clinical Laboratory Tests Ordered, included patient's COVID test is coming back positive.  Flu negative.  No evidence of urinary tract infection.  Troponin mildly elevated at 34 and will continue to follow.  Patient also with mild elevation in lipase at 346 with acute kidney injury noted with creatinine of 2.44.  Review of prior labs show normal creatinine in January.  No acute electrolyte disturbance.    Radiologic Tests Ordered, included CT abdomen/pelvis and CXR. I independently interpreted the images and agree with radiology interpretation.   Cardiac Monitor Tracing which shows sinus tachycardia.   Social Determinants of Health Risk patient is a former smoker.   Consult complete with Hospitalist, Dr. Denton Brick. Plan for admit.   Medical Decision Making: Summary:  Patient presents to the emergency department for evaluation of abdominal pain with vomiting and diarrhea.  Abdomen is mildly tender especially in the  epigastric region.  Also complaining of some nonspecific chest discomfort with EKG showing no acute ischemic change.  We will add on troponin and a chest x-ray.  Patient will need to be pretreated for her CT based on iodine allergy of swelling but feel that given the focal tenderness and vital sign abnormalities  CT is indicated.  Patient received pre-treatment medications but creatinine came back showing AKI to a range where contrast is unable to be administered. Have changed CT to w/o contrast.   Reevaluation with update and discussion with patient.  Discussed results of her CT scan along with lab work, COVID positivity.  She is feeling somewhat improved after Zofran and IV fluids.  Her symptoms have been more than 5 days on requestioning and so is not a candidate for antiviral medication. She is in agreement with plan for admit.   Disposition: admit  ____________________________________________  FINAL CLINICAL IMPRESSION(S) / ED DIAGNOSES  Final diagnoses:  AKI (acute kidney injury) (HCC)  Nausea vomiting and diarrhea  COVID-19    Note:  This document was prepared using Dragon voice recognition software and may include unintentional dictation errors.  Alona Bene, MD, [facility] Emergency Medicine    Lorie Cleckley, Arlyss Repress, MD 05/02/22 [phone]

## 2022-05-03 DIAGNOSIS — L8990 Pressure ulcer of unspecified site, unspecified stage: Secondary | ICD-10-CM | POA: Insufficient documentation

## 2022-05-03 DIAGNOSIS — U071 COVID-19: Secondary | ICD-10-CM | POA: Diagnosis not present

## 2022-05-03 LAB — CBC WITH DIFFERENTIAL/PLATELET
Abs Immature Granulocytes: 0.09 10*3/uL — ABNORMAL HIGH (ref 0.00–0.07)
Basophils Absolute: 0 10*3/uL (ref 0.0–0.1)
Basophils Relative: 0 %
Eosinophils Absolute: 0 10*3/uL (ref 0.0–0.5)
Eosinophils Relative: 0 %
HCT: 34.8 % — ABNORMAL LOW (ref 36.0–46.0)
Hemoglobin: 11.2 g/dL — ABNORMAL LOW (ref 12.0–15.0)
Immature Granulocytes: 1 %
Lymphocytes Relative: 8 %
Lymphs Abs: 1.3 10*3/uL (ref 0.7–4.0)
MCH: 32.2 pg (ref 26.0–34.0)
MCHC: 32.2 g/dL (ref 30.0–36.0)
MCV: 100 fL (ref 80.0–100.0)
Monocytes Absolute: 0.5 10*3/uL (ref 0.1–1.0)
Monocytes Relative: 3 %
Neutro Abs: 14 10*3/uL — ABNORMAL HIGH (ref 1.7–7.7)
Neutrophils Relative %: 88 %
Platelets: 143 10*3/uL — ABNORMAL LOW (ref 150–400)
RBC: 3.48 MIL/uL — ABNORMAL LOW (ref 3.87–5.11)
RDW: 12.3 % (ref 11.5–15.5)
WBC: 15.9 10*3/uL — ABNORMAL HIGH (ref 4.0–10.5)
nRBC: 0 % (ref 0.0–0.2)

## 2022-05-03 LAB — PHOSPHORUS: Phosphorus: 2.3 mg/dL — ABNORMAL LOW (ref 2.5–4.6)

## 2022-05-03 LAB — MAGNESIUM: Magnesium: 1.9 mg/dL (ref 1.7–2.4)

## 2022-05-03 LAB — COMPREHENSIVE METABOLIC PANEL
ALT: 72 U/L — ABNORMAL HIGH (ref 0–44)
AST: 160 U/L — ABNORMAL HIGH (ref 15–41)
Albumin: 3 g/dL — ABNORMAL LOW (ref 3.5–5.0)
Alkaline Phosphatase: 96 U/L (ref 38–126)
Anion gap: 7 (ref 5–15)
BUN: 24 mg/dL — ABNORMAL HIGH (ref 8–23)
CO2: 23 mmol/L (ref 22–32)
Calcium: 8.4 mg/dL — ABNORMAL LOW (ref 8.9–10.3)
Chloride: 114 mmol/L — ABNORMAL HIGH (ref 98–111)
Creatinine, Ser: 1.62 mg/dL — ABNORMAL HIGH (ref 0.44–1.00)
GFR, Estimated: 34 mL/min — ABNORMAL LOW (ref >60)
Glucose, Bld: 146 mg/dL — ABNORMAL HIGH (ref 70–99)
Potassium: 3.1 mmol/L — ABNORMAL LOW (ref 3.5–5.1)
Sodium: 144 mmol/L (ref 135–145)
Total Bilirubin: 1 mg/dL (ref 0.3–1.2)
Total Protein: 6.5 g/dL (ref 6.5–8.1)

## 2022-05-03 LAB — FERRITIN: Ferritin: 3516 ng/mL — ABNORMAL HIGH (ref 11–307)

## 2022-05-03 LAB — C-REACTIVE PROTEIN: CRP: 3.9 mg/dL — ABNORMAL HIGH (ref <1.0)

## 2022-05-03 LAB — D-DIMER, QUANTITATIVE: D-Dimer, Quant: 5.57 ug/mL-FEU — ABNORMAL HIGH (ref 0.00–0.50)

## 2022-05-03 MED ORDER — LEVOFLOXACIN IN D5W 500 MG/100ML IV SOLN
500.0000 mg | INTRAVENOUS | Status: DC
  Administered 2022-05-04 – 2022-05-06 (×2): 500 mg via INTRAVENOUS
  Filled 2022-05-03 (×2): qty 100

## 2022-05-03 MED ORDER — IPRATROPIUM-ALBUTEROL 20-100 MCG/ACT IN AERS
1.0000 | INHALATION_SPRAY | Freq: Four times a day (QID) | RESPIRATORY_TRACT | Status: DC
  Administered 2022-05-03 – 2022-05-04 (×4): 1 via RESPIRATORY_TRACT

## 2022-05-03 MED ORDER — POTASSIUM CHLORIDE CRYS ER 20 MEQ PO TBCR: 40.0000 meq | EXTENDED_RELEASE_TABLET | ORAL | Status: DC

## 2022-05-03 MED ORDER — POTASSIUM CHLORIDE 10 MEQ/100ML IV SOLN
10.0000 meq | INTRAVENOUS | Status: AC
  Administered 2022-05-03 (×4): 10 meq via INTRAVENOUS
  Filled 2022-05-03 (×4): qty 100

## 2022-05-03 MED ORDER — POTASSIUM PHOSPHATES 15 MMOLE/5ML IV SOLN
15.0000 mmol | Freq: Once | INTRAVENOUS | Status: AC
  Administered 2022-05-03: 15 mmol via INTRAVENOUS
  Filled 2022-05-03: qty 5

## 2022-05-03 NOTE — Progress Notes (Signed)
Tele called, reported Pt's HR fluctuating between 100-130s. Dr. Mikeal Hawthorne notified. Pt was given scheduled dose of Metoprolol 50 at 2042, Tele call again shortly after reporting HR had maintained around 140 for about 10 minutes. Dr. Mikeal Hawthorne notified of this. Order given to monitor at this time.

## 2022-05-03 NOTE — Progress Notes (Signed)
PROGRESS NOTE     [name], is a 71 y.o. female, DOB - [birth year], [id number]  Admit date - 05/02/2022   Admitting Physician Deyon Chizek Denton Brick, MD  Outpatient Primary MD for the patient is [name], [name], MD  LOS - 1  Chief Complaint  Patient presents with   Emesis        Brief Narrative:  71 y.o. female with PMH of CVA, HLD, HTN , poor visual acuity, history of CAD/prior MI, GERD and depressive disorder admitted with acute hypoxic respiratory failure secondary to COVID-19 infection, as well as concerns for colitis and AKI   -Assessment and Plan:  1)Acute hypoxic respiratory failure secondary to COVID-19 infection--- The treatment plan and use of medications  for treatment of COVID-19 infection and possible side effects were discussed with patient and her son [name] -Hypoxia (continue to require oxygen up to 2 L via nasal cannula -----Patient and son verbalizes understanding and agrees to treatment protocols   --Patient is positive for COVID-19 infection, patient is tachypneic/hypoxic and requiring continuous supplemental oxygen---patient meets criteria for initiation of Molnupiravir AND Steroid therapy per protocol  --Check and trend inflammatory markers including D-dimer, ferritin and  CRP---also follow CBC and CMP --Supplemental oxygen to keep O2 sats above 93% --- Encourage prone positioning for More than 16 hours/day in increments of 2 to 3 hours at a time if able to tolerate --Attempt to maintain euvolemic state --Zinc and vitamin C as ordered -Albuterol/Combivent inhaler as needed -PPI while on high-dose steroids COVID-19 Labs  Recent Labs    05/03/22 0442  DDIMER 5.57*  FERRITIN 3,516*  CRP 3.9*    2)Acute Colitis--- -CT abdomen and pelvis without contrast--- suggest severe acute colitis predominantly involving the mid to distal transverse colon -Patient with leukocytosis, abdominal pain and vomiting and diarrhea -Continue Levaquin and Flagyl due  to penicillin allergy--- -???  COVID-related colitis versus bacterial colitis -Leukocytosis persist suspect partly due to steroids -No further BMs   3)H/o CAD/Prior MI--- no ACS type symptoms at this time --Troponin 34 , repeat 30 -EKG sinus rhythm with PVCs -Continue metoprolol, aspirin and restart Crestor   4)AKI----acute kidney injury--suspect due to dehydration in the setting of diarrhea  --Creatinine is up to 2.44 from a baseline of between 0.7-0.8, -Creatinine improving with IV fluids ---renally adjust medications, avoid nephrotoxic agents / dehydration  / hypotension   5)HTN--- hold amlodipine, hold clonidine... Due to soft BP -Continue metoprolol   6)H/o CVA--- no strokelike symptoms at this time continue aspirin and Crestor   7)COPD/Emphysema----no acute exacerbation at this time -COVID-related hypoxia as above #1 -Continue bronchodilators   8) elevated lipase---346 -CT abdomen and pelvis without abnormalities of the pancreas -Clinically presentation not consistent with acute pancreatitis -Continue to monitor closely   9) anemia and thrombocytopenia--- anemia appears not to be new... Thrombocytopenia appears to be new -No bleeding concerns at this time -Monitor closely  10) hypophosphatemia/hypokalemia--- replace and recheck   Disposition/Need for in-Hospital Stay- patient unable to be discharged at this time due to -COVID induced acute hypoxic respiratory failure requiring supplemental oxygen, IV steroids -Dehydration/AKI due to colitis/diarrhea requiring IV fluids*   Status is: Inpatient   Remains inpatient appropriate because:    Dispo: The patient is from: Home              Anticipated d/c is to: Home              Anticipated d/c date is: 2 days  Patient currently is not medically stable to d/c. Barriers: Not Clinically Stable-   Family Communication:    NA (patient is alert, awake and coherent)  -Discussed with patient's son [name]  DVT  Prophylaxis  :   - SCDs   heparin injection 5,000 Units Start: 05/02/22 1415 SCDs Start: 05/02/22 1318 Place TED hose Start: 05/02/22 1318   Lab Results  Component Value Date   PLT 143 (L) 05/03/2022   Inpatient Medications  Scheduled Meds:  vitamin C  500 mg Oral Daily   aspirin EC  81 mg Oral Daily   atorvastatin  20 mg Oral Daily   cyanocobalamin  500 mcg Oral Daily   dextromethorphan-guaiFENesin  1 tablet Oral BID   feeding supplement  237 mL Oral BID BM   folic acid  1 mg Oral Daily   heparin  5,000 Units Subcutaneous Q8H   Ipratropium-Albuterol  1 puff Inhalation Q6H   linaclotide  72 mcg Oral Daily   methylPREDNISolone (SOLU-MEDROL) injection  40 mg Intravenous Q12H   Followed by   Melene Muller ON 05/05/2022] predniSONE  50 mg Oral Daily   metoprolol tartrate  50 mg Oral BID   molnupiravir EUA  4 capsule Oral BID   multivitamin with minerals  1 tablet Oral Daily   pantoprazole  40 mg Oral Daily   sodium chloride flush  3 mL Intravenous Q12H   sodium chloride flush  3 mL Intravenous Q12H   zinc sulfate  220 mg Oral Daily   Continuous Infusions:  sodium chloride 100 mL/hr at 05/02/22 1853   sodium chloride     [START ON 05/04/2022] levofloxacin (LEVAQUIN) IV     metronidazole 500 mg (05/03/22 0834)   potassium chloride     PRN Meds:.sodium chloride, acetaminophen **OR** acetaminophen, albuterol, chlorpheniramine-HYDROcodone, diphenhydrAMINE, guaiFENesin-dextromethorphan, hydrALAZINE, HYDROcodone-acetaminophen, ondansetron **OR** ondansetron (ZOFRAN) IV, sodium chloride flush, traZODone   Anti-infectives (From admission, onward)    Start     Dose/Rate Route Frequency Ordered Stop   05/04/22 0915  levofloxacin (LEVAQUIN) IVPB 500 mg        500 mg 100 mL/hr over 60 Minutes Intravenous Every 48 hours 05/03/22 0917     05/02/22 1400  molnupiravir EUA (LAGEVRIO) capsule 800 mg        4 capsule Oral 2 times daily 05/02/22 1308 05/07/22 0959   05/02/22 1000   nirmatrelvir/ritonavir EUA (PAXLOVID) 3 tablet  Status:  Discontinued        3 tablet Oral 2 times daily 05/02/22 0914 05/02/22 0919   05/02/22 1000  nirmatrelvir/ritonavir EUA (renal dosing) (PAXLOVID) 2 tablet  Status:  Discontinued        2 tablet Oral 2 times daily 05/02/22 0919 05/02/22 1308   05/02/22 0915  levofloxacin (LEVAQUIN) IVPB 750 mg  Status:  Discontinued        750 mg 100 mL/hr over 90 Minutes Intravenous Every 48 hours 05/02/22 0914 05/03/22 0917   05/02/22 0915  metroNIDAZOLE (FLAGYL) IVPB 500 mg        500 mg 100 mL/hr over 60 Minutes Intravenous Every 12 hours 05/02/22 0914           Subjective: Alejandro Adcox today has no fevers, no emesis,  No chest pain,   No further diarrhea -Appetite remains poor   Objective: Vitals:   05/03/22 0823 05/03/22 1010 05/03/22 1326 05/03/22 1421  BP: (!) 142/91  126/75   Pulse: (!) 106  90   Resp:   15   Temp:   98.6  F (37 C)   TempSrc:      SpO2:  (!) 88% 100% 100%  Weight:      Height:        Intake/Output Summary (Last 24 hours) at 05/03/2022 1749 Last data filed at 05/03/2022 1300 Gross per 24 hour  Intake 840 ml  Output --  Net 840 ml   Filed Weights   05/02/22 0628  Weight: 40 kg    Physical Exam  Gen:- Awake Alert,  in no apparent distress  HEENT:- [place].AT, No sclera icterus Nose- [place] 2L/min diminished Neck-Supple Neck,No JVD,.  Lungs-diminished breath sounds, no wheezing  CV- S1, S2 normal, regular  Abd-  +ve B.Sounds, Abd Soft, No tenderness,    Extremity/Skin:- No  edema, pedal pulses present  Psych-affect is appropriate, oriented x3 Neuro-no new focal deficits, no tremors  Data Reviewed: I have personally reviewed following labs and imaging studies  CBC: Recent Labs  Lab 05/02/22 0705 05/03/22 0442  WBC 12.9* 15.9*  NEUTROABS 10.5* 14.0*  HGB 13.8 11.2*  HCT 43.3 34.8*  MCV 99.8 100.0  PLT 147* 143*   Basic Metabolic Panel: Recent Labs  Lab 05/02/22 0705 05/03/22 0442  NA 139  144  K 3.7 3.1*  CL 102 114*  CO2 26 23  GLUCOSE 180* 146*  BUN 31* 24*  CREATININE 2.44* 1.62*  CALCIUM 9.0 8.4*  MG  --  1.9  PHOS  --  2.3*   GFR: Estimated Creatinine Clearance: 19.8 mL/min (A) (by C-G formula based on SCr of 1.62 mg/dL (H)). Liver Function Tests: Recent Labs  Lab 05/02/22 0705 05/03/22 0442  AST 91* 160*  ALT 47* 72*  ALKPHOS 116 96  BILITOT 1.0 1.0  PROT 8.0 6.5  ALBUMIN 3.7 3.0*   Cardiac Enzymes: No results for input(s): "CKTOTAL", "CKMB", "CKMBINDEX", "TROPONINI" in the last 168 hours. BNP (last 3 results) No results for input(s): "PROBNP" in the last 8760 hours. HbA1C: No results for input(s): "HGBA1C" in the last 72 hours. Sepsis Labs: @LABRCNTIP (procalcitonin:4,lacticidven:4) ) Recent Results (from the past 240 hour(s))  Resp Panel by RT-PCR (Flu A&B, Covid) Anterior Nasal Swab     Status: Abnormal   Collection Time: 05/02/22  7:28 AM   Specimen: Anterior Nasal Swab  Result Value Ref Range Status   SARS Coronavirus 2 by RT PCR POSITIVE (A) NEGATIVE Final    Comment: (NOTE) SARS-CoV-2 target nucleic acids are DETECTED.  The SARS-CoV-2 RNA is generally detectable in upper respiratory specimens during the acute phase of infection. Positive results are indicative of the presence of the identified virus, but do not rule out bacterial infection or co-infection with other pathogens not detected by the test. Clinical correlation with patient history and other diagnostic information is necessary to determine patient infection status. The expected result is Negative.  Fact Sheet for Patients: 07/02/22  Fact Sheet for Healthcare Providers: BloggerCourse.com  This test is not yet approved or cleared by the [employer] FDA and  has been authorized for detection and/or diagnosis of SARS-CoV-2 by FDA under an Emergency Use Authorization (EUA).  This EUA will remain in effect (meaning  this test can be used) for the duration of  the COVID-19 declaration under Section 564(b)(1) of the A ct, 21 U.S.C. section 360bbb-3(b)(1), unless the authorization is terminated or revoked sooner.     Influenza A by PCR NEGATIVE NEGATIVE Final   Influenza B by PCR NEGATIVE NEGATIVE Final    Comment: (NOTE) The Xpert Xpress SARS-CoV-2/FLU/RSV plus assay is intended  as an aid in the diagnosis of influenza from Nasopharyngeal swab specimens and should not be used as a sole basis for treatment. Nasal washings and aspirates are unacceptable for Xpert Xpress SARS-CoV-2/FLU/RSV testing.  Fact Sheet for Patients: EntrepreneurPulse.com.au  Fact Sheet for Healthcare Providers: IncredibleEmployment.be  This test is not yet approved or cleared by the [place] FDA and has been authorized for detection and/or diagnosis of SARS-CoV-2 by FDA under an Emergency Use Authorization (EUA). This EUA will remain in effect (meaning this test can be used) for the duration of the COVID-19 declaration under Section 564(b)(1) of the Act, 21 U.S.C. section 360bbb-3(b)(1), unless the authorization is terminated or revoked.  Performed at [facility], [address], [place], [place] [address]       Radiology Studies: DG Chest 2 View  Result Date: 05/02/2022 CLINICAL DATA:  71 year old female with history of abdominal pain, vomiting and diarrhea. EXAM: CHEST - 2 VIEW COMPARISON:  Chest x-ray 10/09/2021. FINDINGS: Lung volumes are increased with diffuse emphysematous changes. No consolidative airspace disease. No pleural effusions. No pneumothorax. No pulmonary nodule or mass noted. Pulmonary vasculature and the cardiomediastinal silhouette are within normal limits. Atherosclerosis in the thoracic aorta. IMPRESSION: 1. No radiographic evidence of acute cardiopulmonary disease. 2. Emphysema. 3. Aortic atherosclerosis. Electronically Signed   By: Vinnie Langton M.D.   On:  05/02/2022 08:51   CT ABDOMEN PELVIS WO CONTRAST  Result Date: 05/02/2022 CLINICAL DATA:  71 year old female with history of acute onset of nonlocalized abdominal pain, vomiting and diarrhea for the past 3 days. EXAM: CT ABDOMEN AND PELVIS WITHOUT CONTRAST TECHNIQUE: Multidetector CT imaging of the abdomen and pelvis was performed following the standard protocol without IV contrast. RADIATION DOSE REDUCTION: This exam was performed according to the departmental dose-optimization program which includes automated exposure control, adjustment of the mA and/or kV according to patient size and/or use of iterative reconstruction technique. COMPARISON:  CT of the abdomen and pelvis 10/09/2021. FINDINGS: Lower chest: Diffuse bronchial wall thickening and emphysematous changes in the lung bases bilaterally. Small right-sided Bochdalek's hernia. Scarring in the lung bases bilaterally. Atherosclerotic calcifications in the descending thoracic aorta as well as the left anterior descending, left circumflex and right coronary arteries. Hepatobiliary: There are low-attenuation lesions in the liver, incompletely characterized on today's noncontrast CT examination, previously characterized as simple cysts on prior abdominal MRI 10/14/2021 (no imaging follow-up is recommended), measuring up to 1.3 cm in segment 4A. No other suspicious appearing hepatic lesions are noted. Pancreas: No definite pancreatic mass or peripancreatic fluid collections or inflammatory changes are noted on today's noncontrast CT examination. Spleen: Unremarkable. Adrenals/Urinary Tract: Large low-attenuation lesion measuring 5.4 cm in the upper pole of the left kidney, and smaller 1.2 cm low-attenuation lesion in the interpolar region of the left kidney, incompletely characterized on today's noncontrast CT examination, but previously characterized as simple cysts on MRI 10/14/2021 (no imaging follow-up is recommended). Right kidney and bilateral adrenal  glands are unremarkable in appearance. No hydroureteronephrosis. Urinary bladder is unremarkable in appearance. Stomach/Bowel: The appearance of the stomach is normal. There is no pathologic dilatation of small bowel or colon. There is profound mural thickening and extensive surrounding inflammatory changes associated with the mid and distal transverse colon, concerning for severe acute colitis. The appendix is not confidently identified and may be surgically absent. Regardless, there are no inflammatory changes noted adjacent to the cecum to suggest the presence of an acute appendicitis at this time. Vascular/Lymphatic: Atherosclerotic calcifications throughout the abdominal aorta and pelvic  vasculature. No definite lymphadenopathy confidently identified in the abdomen or pelvis on today's noncontrast CT examination. Reproductive: Status post hysterectomy. Ovaries are not confidently identified may be surgically absent or atrophic. Other: No significant volume of ascites.  No pneumoperitoneum. Musculoskeletal: There are no aggressive appearing lytic or blastic lesions noted in the visualized portions of the skeleton. IMPRESSION: 1. Findings suggest severe acute colitis, predominantly involving the mid to distal transverse colon. 2. Aortic atherosclerosis, in addition to at least three-vessel coronary artery disease. Assessment for potential risk factor modification, dietary therapy or pharmacologic therapy may be warranted, if clinically indicated. 3. Emphysema. 4. Additional incidental findings, as above. Electronically Signed   By: Vinnie Langton M.D.   On: 05/02/2022 08:50     Scheduled Meds:  vitamin C  500 mg Oral Daily   aspirin EC  81 mg Oral Daily   atorvastatin  20 mg Oral Daily   cyanocobalamin  500 mcg Oral Daily   dextromethorphan-guaiFENesin  1 tablet Oral BID   feeding supplement  237 mL Oral BID BM   folic acid  1 mg Oral Daily   heparin  5,000 Units Subcutaneous Q8H    Ipratropium-Albuterol  1 puff Inhalation Q6H   linaclotide  72 mcg Oral Daily   methylPREDNISolone (SOLU-MEDROL) injection  40 mg Intravenous Q12H   Followed by   Derrill Memo ON 05/05/2022] predniSONE  50 mg Oral Daily   metoprolol tartrate  50 mg Oral BID   molnupiravir EUA  4 capsule Oral BID   multivitamin with minerals  1 tablet Oral Daily   pantoprazole  40 mg Oral Daily   sodium chloride flush  3 mL Intravenous Q12H   sodium chloride flush  3 mL Intravenous Q12H   zinc sulfate  220 mg Oral Daily   Continuous Infusions:  sodium chloride 100 mL/hr at 05/02/22 1853   sodium chloride     [START ON 05/04/2022] levofloxacin (LEVAQUIN) IV     metronidazole 500 mg (05/03/22 0834)   potassium chloride       LOS: 1 day    Roxan Hockey M.D on 05/03/2022 at 5:49 PM  Go to [email or web address] - for contact info  [facility] - Office  [phone]  If 7PM-7AM, please contact night-coverage [email or web address] 05/03/2022, 5:49 PM

## 2022-05-03 NOTE — Progress Notes (Signed)
Patient is refusing oral potassium. MD Emokpae notified.

## 2022-05-04 DIAGNOSIS — U071 COVID-19: Secondary | ICD-10-CM | POA: Diagnosis not present

## 2022-05-04 LAB — CBC WITH DIFFERENTIAL/PLATELET
Abs Immature Granulocytes: 0.16 10*3/uL — ABNORMAL HIGH (ref 0.00–0.07)
Basophils Absolute: 0 10*3/uL (ref 0.0–0.1)
Basophils Relative: 0 %
Eosinophils Absolute: 0 10*3/uL (ref 0.0–0.5)
Eosinophils Relative: 0 %
HCT: 33.5 % — ABNORMAL LOW (ref 36.0–46.0)
Hemoglobin: 10.9 g/dL — ABNORMAL LOW (ref 12.0–15.0)
Immature Granulocytes: 1 %
Lymphocytes Relative: 4 %
Lymphs Abs: 0.8 10*3/uL (ref 0.7–4.0)
MCH: 32.4 pg (ref 26.0–34.0)
MCHC: 32.5 g/dL (ref 30.0–36.0)
MCV: 99.7 fL (ref 80.0–100.0)
Monocytes Absolute: 0.6 10*3/uL (ref 0.1–1.0)
Monocytes Relative: 3 %
Neutro Abs: 19.8 10*3/uL — ABNORMAL HIGH (ref 1.7–7.7)
Neutrophils Relative %: 92 %
Platelets: 168 10*3/uL (ref 150–400)
RBC: 3.36 MIL/uL — ABNORMAL LOW (ref 3.87–5.11)
RDW: 12.4 % (ref 11.5–15.5)
WBC: 21.4 10*3/uL — ABNORMAL HIGH (ref 4.0–10.5)
nRBC: 0 % (ref 0.0–0.2)

## 2022-05-04 LAB — COMPREHENSIVE METABOLIC PANEL
ALT: 85 U/L — ABNORMAL HIGH (ref 0–44)
AST: 181 U/L — ABNORMAL HIGH (ref 15–41)
Albumin: 2.9 g/dL — ABNORMAL LOW (ref 3.5–5.0)
Alkaline Phosphatase: 93 U/L (ref 38–126)
Anion gap: 6 (ref 5–15)
BUN: 18 mg/dL (ref 8–23)
CO2: 21 mmol/L — ABNORMAL LOW (ref 22–32)
Calcium: 7.9 mg/dL — ABNORMAL LOW (ref 8.9–10.3)
Chloride: 114 mmol/L — ABNORMAL HIGH (ref 98–111)
Creatinine, Ser: 1.33 mg/dL — ABNORMAL HIGH (ref 0.44–1.00)
GFR, Estimated: 43 mL/min — ABNORMAL LOW (ref >60)
Glucose, Bld: 152 mg/dL — ABNORMAL HIGH (ref 70–99)
Potassium: 3.2 mmol/L — ABNORMAL LOW (ref 3.5–5.1)
Sodium: 141 mmol/L (ref 135–145)
Total Bilirubin: 0.9 mg/dL (ref 0.3–1.2)
Total Protein: 6.3 g/dL — ABNORMAL LOW (ref 6.5–8.1)

## 2022-05-04 LAB — C-REACTIVE PROTEIN: CRP: 1.1 mg/dL — ABNORMAL HIGH (ref <1.0)

## 2022-05-04 LAB — GLUCOSE, CAPILLARY: Glucose-Capillary: 240 mg/dL — ABNORMAL HIGH (ref 70–99)

## 2022-05-04 LAB — MAGNESIUM: Magnesium: 1.7 mg/dL (ref 1.7–2.4)

## 2022-05-04 LAB — D-DIMER, QUANTITATIVE: D-Dimer, Quant: 3.38 ug/mL-FEU — ABNORMAL HIGH (ref 0.00–0.50)

## 2022-05-04 LAB — FERRITIN: Ferritin: 2778 ng/mL — ABNORMAL HIGH (ref 11–307)

## 2022-05-04 LAB — PHOSPHORUS: Phosphorus: 2.1 mg/dL — ABNORMAL LOW (ref 2.5–4.6)

## 2022-05-04 MED ORDER — BOOST / RESOURCE BREEZE PO LIQD CUSTOM
1.0000 | Freq: Three times a day (TID) | ORAL | Status: DC
  Administered 2022-05-04: 1 via ORAL

## 2022-05-04 MED ORDER — POTASSIUM CHLORIDE 10 MEQ/100ML IV SOLN
10.0000 meq | INTRAVENOUS | Status: AC
  Administered 2022-05-04 (×4): 10 meq via INTRAVENOUS
  Filled 2022-05-04 (×4): qty 100

## 2022-05-04 MED ORDER — POTASSIUM PHOSPHATES 15 MMOLE/5ML IV SOLN
30.0000 mmol | Freq: Once | INTRAVENOUS | Status: AC
  Administered 2022-05-04: 30 mmol via INTRAVENOUS
  Filled 2022-05-04: qty 10

## 2022-05-04 MED ORDER — POTASSIUM PHOSPHATES 15 MMOLE/5ML IV SOLN
30.0000 mmol | Freq: Once | INTRAVENOUS | Status: DC
  Filled 2022-05-04: qty 10

## 2022-05-04 MED ORDER — SODIUM CHLORIDE 0.9 % IV SOLN: INTRAVENOUS | Status: AC

## 2022-05-04 MED ORDER — IPRATROPIUM-ALBUTEROL 20-100 MCG/ACT IN AERS
1.0000 | INHALATION_SPRAY | Freq: Two times a day (BID) | RESPIRATORY_TRACT | Status: DC
  Administered 2022-05-04 – 2022-05-06 (×4): 1 via RESPIRATORY_TRACT

## 2022-05-04 MED ORDER — PROSOURCE PLUS PO LIQD
30.0000 mL | Freq: Three times a day (TID) | ORAL | Status: DC
  Filled 2022-05-04 (×6): qty 30

## 2022-05-04 MED ORDER — MAGNESIUM SULFATE 2 GM/50ML IV SOLN
2.0000 g | Freq: Once | INTRAVENOUS | Status: AC
  Administered 2022-05-04: 2 g via INTRAVENOUS
  Filled 2022-05-04: qty 50

## 2022-05-04 NOTE — Progress Notes (Signed)
CSW noted per chart review that pt is high risk for readmission. CSW attempted to reach pt via room phone and cell phone, CSW unable to reach pt. CSW attempted to reach pts son to complete assessment. Unable to make contact at this time. TOC to continue following pt and attempt assessment at a later date.

## 2022-05-04 NOTE — Evaluation (Signed)
Clinical/Bedside Swallow Evaluation Patient Details  Name: [name] MRN: [id number] Date of Birth: 1951/06/29  Today's Date: 05/04/2022 Time: SLP Start Time (ACUTE ONLY): 2101 SLP Stop Time (ACUTE ONLY): 2121 SLP Time Calculation (min) (ACUTE ONLY): 20 min  Past Medical History:  Past Medical History:  Diagnosis Date   Anxiety    Blind    right   Colitis 09/07/2011   CVA (cerebral infarction)    GERD (gastroesophageal reflux disease)    Hyperlipidemia    Hypertension    Myocardial infarct (HCC)    1997, 2004   Severe major depression with psychotic features (HCC)    2004   Stroke [facility]) 2002   Past Surgical History:  Past Surgical History:  Procedure Laterality Date   ABDOMINAL HYSTERECTOMY     BILATERAL SALPINGOOPHORECTOMY     CHOLECYSTECTOMY N/A 10/15/2021   Procedure: LAPAROSCOPIC CHOLECYSTECTOMY WITH INTRAOPERATIVE CHOLANGIOGRAM;  Surgeon: Franky Macho, MD;  Location: [facility];  Service: General;  Laterality: N/A;   COLONOSCOPY N/A 07/17/2014   RMR: Extremely redundant colon. Colonic diverticulosis. Inadequate prepartation precluded examination of all the rectal and colonic.  I suspect slow colonic transit in the setting of a markely redundant colon.   CORONARY ANGIOPLASTY WITH STENT PLACEMENT  [id number]   cyst removed from left wrist     ERCP N/A 10/10/2021   Procedure: ENDOSCOPIC RETROGRADE CHOLANGIOPANCREATOGRAPHY (ERCP);  Surgeon: Malissa Hippo, MD;  Location: [facility];  Service: Gastroenterology;  Laterality: N/A;   ERCP N/A 10/13/2021   Procedure: ENDOSCOPIC RETROGRADE CHOLANGIOPANCREATOGRAPHY (ERCP);  Surgeon: Malissa Hippo, MD;  Location: [facility];  Service: Gastroenterology;  Laterality: N/A;   ESOPHAGOGASTRODUODENOSCOPY  09/05/2004   Dr Jimmye Norman gastritis, candida esophagitis   ESOPHAGOGASTRODUODENOSCOPY  09/07/2011   BVQ:XIHW, esophageal in the distal esophagus/Mild gastritis   Lumps removed from each breast     SPHINCTEROTOMY N/A 10/10/2021    Procedure: SPHINCTEROTOMY INCOMPLETE, STONE NOT REMOVED;  Surgeon: Malissa Hippo, MD;  Location: [facility];  Service: Gastroenterology;  Laterality: N/A;   HPI:  71 y.o. female with PMH of CVA, HLD, HTN , poor visual acuity, history of CAD/prior MI, GERD and depressive disorder admitted with acute hypoxic respiratory failure secondary to COVID-19 infection, as well as concerns for colitis and AKI. BSE requested.    Assessment / Plan / Recommendation  Clinical Impression  Clinical swallowing evaluation completed while Pt sat on the edge of the bed for limited trials of thin liquids. Pt reports terrible "stomach pain" and reports nothing will "go down". Pt did accept one small sip of thin liquid and she demonstrated immediate gagging and belching after the swallow. She pleasantly refused all solid trials stating they will just "come back". Pt's s/sx indicate an esophageal component. It is possible there is an oropharyngeal component, however, it is difficult to assess since Pt is unable to accept solid trials at this time. Recommend GI consult as s/sx indicate primary esophageal dysphagia. ST will continue to follow but will defer to GI for diet recommendation and treatment. SLP Visit Diagnosis: Dysphagia, unspecified (R13.10)    Aspiration Risk  Mild aspiration risk;Moderate aspiration risk    Diet Recommendation Dysphagia 2 (Fine chop);Thin liquid   Liquid Administration via: Cup;Straw Medication Administration: Crushed with puree Supervision: Patient able to self feed;Intermittent supervision to cue for compensatory strategies Compensations: Minimize environmental distractions;Slow rate;Small sips/bites Postural Changes: Seated upright at 90 degrees;Remain upright for at least 30 minutes after po intake    Other  Recommendations Recommended Consults: Consider GI  evaluation;Consider esophageal assessment Oral Care Recommendations: Oral care BID    Recommendations for follow up therapy are one  component of a multi-disciplinary discharge planning process, led by the attending physician.  Recommendations may be updated based on patient status, additional functional criteria and insurance authorization.           Frequency and Duration min 2x/week  1 week       Prognosis Prognosis for Safe Diet Advancement: Fair      Swallow Study   General Date of Onset: 05/02/22 HPI: 71 y.o. female with PMH of CVA, HLD, HTN , poor visual acuity, history of CAD/prior MI, GERD and depressive disorder admitted with acute hypoxic respiratory failure secondary to COVID-19 infection, as well as concerns for colitis and AKI. BSE requested. Type of Study: Bedside Swallow Evaluation Previous Swallow Assessment: none in chart Diet Prior to this Study: Dysphagia 2 (chopped);Thin liquids Temperature Spikes Noted: No Respiratory Status: Nasal cannula History of Recent Intubation: No Behavior/Cognition: Alert;Cooperative;Pleasant mood Oral Cavity Assessment: Dry Oral Care Completed by SLP: Recent completion by staff Oral Cavity - Dentition: Edentulous Vision: Functional for self-feeding Self-Feeding Abilities: Able to feed self Patient Positioning: Upright in bed Baseline Vocal Quality: Normal Volitional Cough: Strong Volitional Swallow: Able to elicit    Oral/Motor/Sensory Function Overall Oral Motor/Sensory Function: Within functional limits   Ice Chips Ice chips: Not tested   Thin Liquid Thin Liquid: Within functional limits    Nectar Thick Nectar Thick Liquid: Not tested   Honey Thick Honey Thick Liquid: Not tested   Puree Puree: Not tested   Solid     Solid: Not tested     Tejal Monroy H. Romie Levee, CCC-SLP Speech Language Pathologist  Georgetta Haber 05/04/2022,9:21 PM

## 2022-05-04 NOTE — Progress Notes (Signed)
Initial Nutrition Assessment  DOCUMENTATION CODES:   Underweight  INTERVENTION:   -Liberalize diet to regular for wider variety of meal selections -MVI with minerals daily -Boost Breeze po TID, each supplement provides 250 kcal and 9 grams of protein  -30 ml Prosource Plus TID, each supplement provides 100 kcals and 15 grams protein -Feeding assistance with meals   NUTRITION DIAGNOSIS:   Increased nutrient needs related to acute illness (COVID-19) as evidenced by estimated needs.  GOAL:   Patient will meet greater than or equal to 90% of their needs  MONITOR:   PO intake, Supplement acceptance  REASON FOR ASSESSMENT:   Malnutrition Screening Tool    ASSESSMENT:   Pt with PMH of CVA, HLD, HTN , poor visual acuity, history of CAD/prior MI, GERD and depressive disorder admitted with acute hypoxic respiratory failure secondary to COVID-19 infection, as well as concerns for colitis and AKI  Pt admitted with acute hypoxic respiratory failure secondary to COVID-19 infection.   Reviewed I/O's: +1.2 L x 24 hours and +1.4 L since admission   Pt unavailable at time of visit. Attempted to speak with pt via call to hospital room phone, however, unable to reach. RD unable to obtain further nutrition-related history or complete nutrition-focused physical exam at this time.    Pt currently on a heart healthy diet. Noted meal completions 20-40%. Pt is refusing Ensure supplements. Noted pt has visual impairment and may benefit from feeding assistance.   Reviewed wt hx; pt has experienced a 15.9% wt loss over the past 6 months, which is significant for time frame.   Highly suspect pt with malnutrition, however, unable to identify at this time.   Medications reviewed and include vitamin C, vitamin B-12, folic acid, solu-medrol, and zinc sulfate.   Labs reviewed: K: 3.2, Phos: 2.1.    Diet Order:   Diet Order             Diet Heart Room service appropriate? Yes; Fluid consistency:  Thin  Diet effective now                   EDUCATION NEEDS:   No education needs have been identified at this time  Skin:  Skin Assessment: Skin Integrity Issues: Skin Integrity Issues:: Stage I Stage I: sacrum  Last BM:  05/01/22  Height:   Ht Readings from Last 1 Encounters:  05/02/22 5' (1.524 m)    Weight:   Wt Readings from Last 1 Encounters:  05/02/22 40 kg    Ideal Body Weight:  45.5 kg  BMI:  Body mass index is 17.22 kg/m.  Estimated Nutritional Needs:   Kcal:  1600-1800  Protein:  80-95 grams  Fluid:  > 1.6 L    Levada Schilling, RD, LDN, CDCES Registered Dietitian II Certified Diabetes Care and Education Specialist Please refer to [facility] for RD and/or RD on-call/weekend/after hours pager

## 2022-05-04 NOTE — Progress Notes (Signed)
Patient refused all of her PO night time medications which included Metoprolol, Mucinex and Molnupiravir. Dr. Julian Reil notified via message. Patient stated she was not swallowing any more pills because she has trouble and she is tired. Offered to crush medications if able, she said she was not taking that either.

## 2022-05-04 NOTE — Progress Notes (Signed)
PROGRESS NOTE     [name], is a 71 y.o. female, DOB - 05-26-1951, [id number]  Admit date - 05/02/2022   Admitting Physician Deannah Rossi Denton Brick, MD  Outpatient Primary MD for the patient is [name], [name], MD  LOS - 2  Chief Complaint  Patient presents with   Emesis        Brief Narrative:  71 y.o. female with PMH of CVA, HLD, HTN , poor visual acuity, history of CAD/prior MI, GERD and depressive disorder admitted with acute hypoxic respiratory failure secondary to COVID-19 infection, as well as concerns for colitis and AKI   -Assessment and Plan:  1)Acute hypoxic respiratory failure secondary to COVID-19 infection--- The treatment plan and use of medications  for treatment of COVID-19 infection and possible side effects were discussed with patient and her son [name] -Hypoxia (continue to require oxygen up to 2 L via nasal cannula -----Patient and son verbalizes understanding and agrees to treatment protocols   --Patient is positive for COVID-19 infection, patient is tachypneic/hypoxic and requiring continuous supplemental oxygen---patient meets criteria for initiation of Molnupiravir AND Steroid therapy per protocol  --Check and trend inflammatory markers including D-dimer, ferritin and  CRP---also follow CBC and CMP --Supplemental oxygen to keep O2 sats above 93% --- Encourage prone positioning for More than 16 hours/day in increments of 2 to 3 hours at a time if able to tolerate --Attempt to maintain euvolemic state --Zinc and vitamin C as ordered -Albuterol/Combivent inhaler as needed -Inflammatory markers as noted below are trending down  COVID-19 Labs  Recent Labs    05/03/22 0442 05/04/22 0517  DDIMER 5.57* 3.38*  FERRITIN 3,516* 2,778*  CRP 3.9* 1.1*    2)Acute Colitis--- -CT abdomen and pelvis without contrast--- suggest severe acute colitis predominantly involving the mid to distal transverse colon -Patient presented with leukocytosis, abdominal  pain and vomiting and diarrhea -Continue Levaquin and Flagyl due to penicillin allergy--- -???  COVID-related colitis versus bacterial colitis -Leukocytosis persist suspect partly due to steroids -No further BMs (patient is not really eating much)   3)H/o CAD/Prior MI--- no ACS type symptoms at this time --Troponin 34 , repeat 30 -EKG sinus rhythm with PVCs -Continue metoprolol, aspirin and restart Crestor   4)AKI----acute kidney injury--suspect due to dehydration in the setting of diarrhea  --Creatinine is up to 2.44 from a baseline of between 0.7-0.8, -Creatinine improving with IV fluids ---renally adjust medications, avoid nephrotoxic agents / dehydration  / hypotension   5)HTN--- continue to hold amlodipine and hold clonidine... Due to soft BP -Continue metoprolol   6)H/o CVA--- no strokelike symptoms at this time continue aspirin and Crestor   7)COPD/Emphysema----no acute exacerbation at this time -COVID-related hypoxia as above #1 -Continue bronchodilators   8) elevated lipase---346 -CT abdomen and pelvis without abnormalities of the pancreas -Clinically presentation not consistent with acute pancreatitis -Continue to monitor closely   9) anemia and thrombocytopenia--- anemia appears not to be new... Thrombocytopenia appears to be new -No bleeding concerns at this time -Monitor closely  10) hypophosphatemia/hypokalemia--- replace and recheck  11)Dysphagia----patient with history of prior stroke, swallowing difficulties making it difficult for patient to have significant/adequate oral intake -Change diet to dysphagia 2 -Encourage liquids -Get speech pathologist evaluation   Disposition/Need for in-Hospital Stay- patient unable to be discharged at this time due to -COVID induced acute hypoxic respiratory failure requiring supplemental oxygen, IV steroids -Dehydration/AKI due to colitis/diarrhea requiring IV fluids* -Swallowing difficulties making it difficult for  patient to have significant/adequate oral intake  Status is: Inpatient   Remains inpatient appropriate because:    Dispo: The patient is from: Home              Anticipated d/c is to: Home              Anticipated d/c date is: 2 days              Patient currently is not medically stable to d/c. Barriers: Not Clinically Stable-   Family Communication:    NA (patient is alert, awake and coherent)  -Discussed with patient's son [name]  DVT Prophylaxis  :   - SCDs   heparin injection 5,000 Units Start: 05/02/22 1415 SCDs Start: 05/02/22 1318 Place TED hose Start: 05/02/22 1318   Lab Results  Component Value Date   PLT 168 05/04/2022   Inpatient Medications  Scheduled Meds:  (feeding supplement) PROSource Plus  30 mL Oral TID BM   vitamin C  500 mg Oral Daily   aspirin EC  81 mg Oral Daily   atorvastatin  20 mg Oral Daily   cyanocobalamin  500 mcg Oral Daily   dextromethorphan-guaiFENesin  1 tablet Oral BID   feeding supplement  1 Container Oral TID BM   folic acid  1 mg Oral Daily   heparin  5,000 Units Subcutaneous Q8H   Ipratropium-Albuterol  1 puff Inhalation BID   linaclotide  72 mcg Oral Daily   methylPREDNISolone (SOLU-MEDROL) injection  40 mg Intravenous Q12H   Followed by   Melene Muller ON 05/05/2022] predniSONE  50 mg Oral Daily   metoprolol tartrate  50 mg Oral BID   molnupiravir EUA  4 capsule Oral BID   multivitamin with minerals  1 tablet Oral Daily   pantoprazole  40 mg Oral Daily   sodium chloride flush  3 mL Intravenous Q12H   sodium chloride flush  3 mL Intravenous Q12H   zinc sulfate  220 mg Oral Daily   Continuous Infusions:  sodium chloride     sodium chloride     levofloxacin (LEVAQUIN) IV 500 mg (05/04/22 1006)   metronidazole 500 mg (05/04/22 1112)   potassium PHOSPHATE IVPB (in mmol) 30 mmol (05/04/22 1619)   PRN Meds:.sodium chloride, acetaminophen **OR** acetaminophen, albuterol, chlorpheniramine-HYDROcodone, diphenhydrAMINE,  guaiFENesin-dextromethorphan, hydrALAZINE, HYDROcodone-acetaminophen, ondansetron **OR** ondansetron (ZOFRAN) IV, sodium chloride flush, traZODone   Anti-infectives (From admission, onward)    Start     Dose/Rate Route Frequency Ordered Stop   05/04/22 0915  levofloxacin (LEVAQUIN) IVPB 500 mg        500 mg 100 mL/hr over 60 Minutes Intravenous Every 48 hours 05/03/22 0917     05/02/22 1400  molnupiravir EUA (LAGEVRIO) capsule 800 mg        4 capsule Oral 2 times daily 05/02/22 1308 05/07/22 0959   05/02/22 1000  nirmatrelvir/ritonavir EUA (PAXLOVID) 3 tablet  Status:  Discontinued        3 tablet Oral 2 times daily 05/02/22 0914 05/02/22 0919   05/02/22 1000  nirmatrelvir/ritonavir EUA (renal dosing) (PAXLOVID) 2 tablet  Status:  Discontinued        2 tablet Oral 2 times daily 05/02/22 0919 05/02/22 1308   05/02/22 0915  levofloxacin (LEVAQUIN) IVPB 750 mg  Status:  Discontinued        750 mg 100 mL/hr over 90 Minutes Intravenous Every 48 hours 05/02/22 0914 05/03/22 0917   05/02/22 0915  metroNIDAZOLE (FLAGYL) IVPB 500 mg        500 mg 100 mL/hr  over 60 Minutes Intravenous Every 12 hours 05/02/22 0914         Subjective: Sorah Falkenstein today has no fevers, no emesis,  No chest pain,   No further diarrhea -Appetite remains poor -Patient having dysphagia/swallowing difficulties   Objective: Vitals:   05/04/22 0351 05/04/22 0712 05/04/22 1014 05/04/22 1602  BP: (!) 147/73  (!) 154/82 116/70  Pulse: 83  83 (!) 108  Resp: 18   20  Temp: 98.2 F (36.8 C)   98.5 F (36.9 C)  TempSrc: Oral   Oral  SpO2: 100% 100%  100%  Weight:      Height:        Intake/Output Summary (Last 24 hours) at 05/04/2022 1830 Last data filed at 05/04/2022 1721 Gross per 24 hour  Intake 4587.71 ml  Output --  Net 4587.71 ml   Filed Weights   05/02/22 0628  Weight: 40 kg    Physical Exam  Gen:- Awake Alert,  in no apparent distress  HEENT:- [place].AT, No sclera icterus Nose- [place] 2L/min  diminished Neck-Supple Neck,No JVD,.  Lungs-diminished breath sounds, no wheezing  CV- S1, S2 normal, regular  Abd-  +ve B.Sounds, Abd Soft, No tenderness,    Extremity/Skin:- No  edema, pedal pulses present  Psych-affect is appropriate, oriented x3 Neuro-no new focal deficits, no tremors  Data Reviewed: I have personally reviewed following labs and imaging studies  CBC: Recent Labs  Lab 05/02/22 0705 05/03/22 0442 05/04/22 0517  WBC 12.9* 15.9* 21.4*  NEUTROABS 10.5* 14.0* 19.8*  HGB 13.8 11.2* 10.9*  HCT 43.3 34.8* 33.5*  MCV 99.8 100.0 99.7  PLT 147* 143* 168   Basic Metabolic Panel: Recent Labs  Lab 05/02/22 0705 05/03/22 0442 05/04/22 0517  NA 139 144 141  K 3.7 3.1* 3.2*  CL 102 114* 114*  CO2 26 23 21*  GLUCOSE 180* 146* 152*  BUN 31* 24* 18  CREATININE 2.44* 1.62* 1.33*  CALCIUM 9.0 8.4* 7.9*  MG  --  1.9 1.7  PHOS  --  2.3* 2.1*   GFR: Estimated Creatinine Clearance: 24.1 mL/min (A) (by C-G formula based on SCr of 1.33 mg/dL (H)). Liver Function Tests: Recent Labs  Lab 05/02/22 0705 05/03/22 0442 05/04/22 0517  AST 91* 160* 181*  ALT 47* 72* 85*  ALKPHOS 116 96 93  BILITOT 1.0 1.0 0.9  PROT 8.0 6.5 6.3*  ALBUMIN 3.7 3.0* 2.9*   Cardiac Enzymes: No results for input(s): "CKTOTAL", "CKMB", "CKMBINDEX", "TROPONINI" in the last 168 hours. BNP (last 3 results) No results for input(s): "PROBNP" in the last 8760 hours. HbA1C: No results for input(s): "HGBA1C" in the last 72 hours. Sepsis Labs: @LABRCNTIP (procalcitonin:4,lacticidven:4) ) Recent Results (from the past 240 hour(s))  Resp Panel by RT-PCR (Flu A&B, Covid) Anterior Nasal Swab     Status: Abnormal   Collection Time: 05/02/22  7:28 AM   Specimen: Anterior Nasal Swab  Result Value Ref Range Status   SARS Coronavirus 2 by RT PCR POSITIVE (A) NEGATIVE Final    Comment: (NOTE) SARS-CoV-2 target nucleic acids are DETECTED.  The SARS-CoV-2 RNA is generally detectable in upper  respiratory specimens during the acute phase of infection. Positive results are indicative of the presence of the identified virus, but do not rule out bacterial infection or co-infection with other pathogens not detected by the test. Clinical correlation with patient history and other diagnostic information is necessary to determine patient infection status. The expected result is Negative.  Fact Sheet for Patients: 07/02/22  Fact Sheet for Healthcare Providers: IncredibleEmployment.be  This test is not yet approved or cleared by the [place] FDA and  has been authorized for detection and/or diagnosis of SARS-CoV-2 by FDA under an Emergency Use Authorization (EUA).  This EUA will remain in effect (meaning this test can be used) for the duration of  the COVID-19 declaration under Section 564(b)(1) of the A ct, 21 U.S.C. section 360bbb-3(b)(1), unless the authorization is terminated or revoked sooner.     Influenza A by PCR NEGATIVE NEGATIVE Final   Influenza B by PCR NEGATIVE NEGATIVE Final    Comment: (NOTE) The Xpert Xpress SARS-CoV-2/FLU/RSV plus assay is intended as an aid in the diagnosis of influenza from Nasopharyngeal swab specimens and should not be used as a sole basis for treatment. Nasal washings and aspirates are unacceptable for Xpert Xpress SARS-CoV-2/FLU/RSV testing.  Fact Sheet for Patients: EntrepreneurPulse.com.au  Fact Sheet for Healthcare Providers: IncredibleEmployment.be  This test is not yet approved or cleared by the [place] FDA and has been authorized for detection and/or diagnosis of SARS-CoV-2 by FDA under an Emergency Use Authorization (EUA). This EUA will remain in effect (meaning this test can be used) for the duration of the COVID-19 declaration under Section 564(b)(1) of the Act, 21 U.S.C. section 360bbb-3(b)(1), unless the authorization is  terminated or revoked.  Performed at [facility], [address], [place], [place] [address]       Radiology Studies: No results found.   Scheduled Meds:  (feeding supplement) PROSource Plus  30 mL Oral TID BM   vitamin C  500 mg Oral Daily   aspirin EC  81 mg Oral Daily   atorvastatin  20 mg Oral Daily   cyanocobalamin  500 mcg Oral Daily   dextromethorphan-guaiFENesin  1 tablet Oral BID   feeding supplement  1 Container Oral TID BM   folic acid  1 mg Oral Daily   heparin  5,000 Units Subcutaneous Q8H   Ipratropium-Albuterol  1 puff Inhalation BID   linaclotide  72 mcg Oral Daily   methylPREDNISolone (SOLU-MEDROL) injection  40 mg Intravenous Q12H   Followed by   Derrill Memo ON 05/05/2022] predniSONE  50 mg Oral Daily   metoprolol tartrate  50 mg Oral BID   molnupiravir EUA  4 capsule Oral BID   multivitamin with minerals  1 tablet Oral Daily   pantoprazole  40 mg Oral Daily   sodium chloride flush  3 mL Intravenous Q12H   sodium chloride flush  3 mL Intravenous Q12H   zinc sulfate  220 mg Oral Daily   Continuous Infusions:  sodium chloride     sodium chloride     levofloxacin (LEVAQUIN) IV 500 mg (05/04/22 1006)   metronidazole 500 mg (05/04/22 1112)   potassium PHOSPHATE IVPB (in mmol) 30 mmol (05/04/22 1619)     LOS: 2 days    Roxan Hockey M.D on 05/04/2022 at 6:30 PM  Go to [email or web address] - for contact info  [facility] - Office  [phone]  If 7PM-7AM, please contact night-coverage [email or web address] 05/04/2022, 6:30 PM

## 2022-05-05 DIAGNOSIS — U071 COVID-19: Secondary | ICD-10-CM | POA: Diagnosis not present

## 2022-05-05 LAB — COMPREHENSIVE METABOLIC PANEL
ALT: 78 U/L — ABNORMAL HIGH (ref 0–44)
AST: 135 U/L — ABNORMAL HIGH (ref 15–41)
Albumin: 3 g/dL — ABNORMAL LOW (ref 3.5–5.0)
Alkaline Phosphatase: 92 U/L (ref 38–126)
Anion gap: 5 (ref 5–15)
BUN: 13 mg/dL (ref 8–23)
CO2: 20 mmol/L — ABNORMAL LOW (ref 22–32)
Calcium: 8 mg/dL — ABNORMAL LOW (ref 8.9–10.3)
Chloride: 115 mmol/L — ABNORMAL HIGH (ref 98–111)
Creatinine, Ser: 1.07 mg/dL — ABNORMAL HIGH (ref 0.44–1.00)
GFR, Estimated: 55 mL/min — ABNORMAL LOW (ref >60)
Glucose, Bld: 147 mg/dL — ABNORMAL HIGH (ref 70–99)
Potassium: 3.5 mmol/L (ref 3.5–5.1)
Sodium: 140 mmol/L (ref 135–145)
Total Bilirubin: 0.9 mg/dL (ref 0.3–1.2)
Total Protein: 6.2 g/dL — ABNORMAL LOW (ref 6.5–8.1)

## 2022-05-05 LAB — CBC WITH DIFFERENTIAL/PLATELET
Abs Immature Granulocytes: 0.19 10*3/uL — ABNORMAL HIGH (ref 0.00–0.07)
Basophils Absolute: 0 10*3/uL (ref 0.0–0.1)
Basophils Relative: 0 %
Eosinophils Absolute: 0 10*3/uL (ref 0.0–0.5)
Eosinophils Relative: 0 %
HCT: 35 % — ABNORMAL LOW (ref 36.0–46.0)
Hemoglobin: 11.1 g/dL — ABNORMAL LOW (ref 12.0–15.0)
Immature Granulocytes: 1 %
Lymphocytes Relative: 4 %
Lymphs Abs: 0.8 10*3/uL (ref 0.7–4.0)
MCH: 31.8 pg (ref 26.0–34.0)
MCHC: 31.7 g/dL (ref 30.0–36.0)
MCV: 100.3 fL — ABNORMAL HIGH (ref 80.0–100.0)
Monocytes Absolute: 0.7 10*3/uL (ref 0.1–1.0)
Monocytes Relative: 3 %
Neutro Abs: 19.5 10*3/uL — ABNORMAL HIGH (ref 1.7–7.7)
Neutrophils Relative %: 92 %
Platelets: 182 10*3/uL (ref 150–400)
RBC: 3.49 MIL/uL — ABNORMAL LOW (ref 3.87–5.11)
RDW: 12.5 % (ref 11.5–15.5)
WBC: 21.2 10*3/uL — ABNORMAL HIGH (ref 4.0–10.5)
nRBC: 0 % (ref 0.0–0.2)

## 2022-05-05 LAB — D-DIMER, QUANTITATIVE: D-Dimer, Quant: 2.24 ug/mL-FEU — ABNORMAL HIGH (ref 0.00–0.50)

## 2022-05-05 LAB — GLUCOSE, CAPILLARY
Glucose-Capillary: 135 mg/dL — ABNORMAL HIGH (ref 70–99)
Glucose-Capillary: 146 mg/dL — ABNORMAL HIGH (ref 70–99)
Glucose-Capillary: 167 mg/dL — ABNORMAL HIGH (ref 70–99)
Glucose-Capillary: 184 mg/dL — ABNORMAL HIGH (ref 70–99)
Glucose-Capillary: 243 mg/dL — ABNORMAL HIGH (ref 70–99)

## 2022-05-05 LAB — FERRITIN: Ferritin: 2651 ng/mL — ABNORMAL HIGH (ref 11–307)

## 2022-05-05 LAB — C-REACTIVE PROTEIN: CRP: <0.5 mg/dL (ref <1.0)

## 2022-05-05 LAB — PHOSPHORUS: Phosphorus: 2.3 mg/dL — ABNORMAL LOW (ref 2.5–4.6)

## 2022-05-05 LAB — MAGNESIUM: Magnesium: 2 mg/dL (ref 1.7–2.4)

## 2022-05-05 MED ORDER — METOPROLOL TARTRATE 5 MG/5ML IV SOLN
5.0000 mg | Freq: Two times a day (BID) | INTRAVENOUS | Status: DC
Start: 2022-05-05 — End: 2022-05-06
  Administered 2022-05-05 – 2022-05-06 (×3): 5 mg via INTRAVENOUS
  Filled 2022-05-05 (×3): qty 5

## 2022-05-05 MED ORDER — METOPROLOL TARTRATE 5 MG/5ML IV SOLN
5.0000 mg | Freq: Once | INTRAVENOUS | Status: AC
  Administered 2022-05-05: 5 mg via INTRAVENOUS
  Filled 2022-05-05: qty 5

## 2022-05-05 MED ORDER — AMLODIPINE BESYLATE 5 MG PO TABS
5.0000 mg | ORAL_TABLET | Freq: Every day | ORAL | Status: DC
  Filled 2022-05-05 (×2): qty 1

## 2022-05-05 MED ORDER — MEGESTROL ACETATE 400 MG/10ML PO SUSP
400.0000 mg | Freq: Two times a day (BID) | ORAL | Status: DC
  Filled 2022-05-05 (×4): qty 10

## 2022-05-05 NOTE — Progress Notes (Signed)
Speech Language Pathology Treatment: Dysphagia  Patient Details Name: [name] MRN: [id number] DOB: [birth year] Today's Date: 05/05/2022 Time: 2330-0762 SLP Time Calculation (min) (ACUTE ONLY): 22 min  Assessment / Plan / Recommendation Clinical Impression  Pt seen at bedside for ongoing dysphagia intervention following BSE completed yesterday. Pt continues to report globus sensation and fear of food not going down. She needed max encouragement for PO intake. She tells me that she does not drink water, only sodas, and her appetite and ability to swallow solid foods changed about a month ago. She also reports weight loss. Pt does not appear to have oropharyngeal component to her swallowing problems. She consumed thin liquids, puree, and mech soft (limited amounts) without signs of symptoms of aspiration, however does report globus (points to neck and chest)- question whether referred symptoms. Would consider MBSS or Barium Swallow, however Pt needs to be agreeable to drinking barium. This was discussed with Pt, as she is unwilling to take much by mouth at this time. She was agreeable to eating a mango italian ice and was left with this in room. SLP will continue to follow, however consider GI consult due to suspected esophageal component and Pt reports of significant weight loss. Suggest crushing necessary medications in ice cream or Mango italian ice and continue D2 and thin with encouragement for intake- may need to push liquids as Pt reports ease with this.     HPI HPI: 71 y.o. female with PMH of CVA, HLD, HTN , poor visual acuity, history of CAD/prior MI, GERD and depressive disorder admitted with acute hypoxic respiratory failure secondary to COVID-19 infection, as well as concerns for colitis and AKI. BSE requested.      SLP Plan  Continue with current plan of care      Recommendations for follow up therapy are one component of a multi-disciplinary discharge planning process, led by  the attending physician.  Recommendations may be updated based on patient status, additional functional criteria and insurance authorization.    Recommendations  Diet recommendations: Dysphagia 2 (fine chop);Thin liquid Liquids provided via: Cup;Straw Medication Administration: Crushed with puree Supervision: Patient able to self feed Compensations: Slow rate;Small sips/bites Postural Changes and/or Swallow Maneuvers: Seated upright 90 degrees;Upright 30-60 min after meal                General recommendations:  (Consider GI consult) Oral Care Recommendations: Oral care BID Follow Up Recommendations: Follow physician's recommendations for discharge plan and follow up therapies Assistance recommended at discharge: PRN SLP Visit Diagnosis: Dysphagia, unspecified (R13.10) Plan: Continue with current plan of care         Thank you,  Havery Moros, CCC-SLP [phone]   Khilynn Borntreger  05/05/2022, 12:51 PM

## 2022-05-05 NOTE — Progress Notes (Signed)
PROGRESS NOTE     [name], is a 71 y.o. female, DOB - 10-04-1950, [id number]  Admit date - 05/02/2022   Admitting Physician Jiraiya Mcewan Denton Brick, MD  Outpatient Primary MD for the patient is April, [name], MD  LOS - 3  Chief Complaint  Patient presents with   Emesis        Brief Narrative:  71 y.o. female with PMH of CVA, HLD, HTN , poor visual acuity, history of CAD/prior MI, GERD and depressive disorder admitted with acute hypoxic respiratory failure secondary to COVID-19 infection, as well as concerns for colitis, poor oral intake/dehydration and AKI   -Assessment and Plan:  1)Acute hypoxic respiratory failure secondary to COVID-19 infection--- The treatment plan and use of medications  for treatment of COVID-19 infection and possible side effects were discussed with patient and her son [name] -Hypoxia resolved -----Patient and son verbalizes understanding and agrees to treatment protocols   --Patient is positive for COVID-19 infection, patient is tachypneic/hypoxic and requiring continuous supplemental oxygen---patient meets criteria for initiation of Molnupiravir AND Steroid therapy per protocol  --Check and trend inflammatory markers including D-dimer, ferritin and  CRP--- --Attempt to maintain euvolemic state --Zinc and vitamin C as ordered -Albuterol/Combivent inhaler as needed -Inflammatory markers as noted below are trending down  COVID-19 Labs  Recent Labs    05/03/22 0442 05/04/22 0517 05/05/22 0420  DDIMER 5.57* 3.38* 2.24*  FERRITIN 3,516* 2,778* 2,651*  CRP 3.9* 1.1* <0.5    2)Acute Colitis--- -CT abdomen and pelvis without contrast--- suggest severe acute colitis predominantly involving the mid to distal transverse colon -Patient presented with leukocytosis, abdominal pain and vomiting and diarrhea -Continue Levaquin and Flagyl for total of 7 days (pt has penicillin allergy)--- -???  COVID-related colitis versus bacterial  colitis -Leukocytosis persist suspect partly due to steroids -No further BMs (patient is not really eating much)   3)H/o CAD/Prior MI--- no ACS type symptoms at this time --Troponin 34 , repeat 30 -EKG sinus rhythm with PVCs -Continue metoprolol, aspirin and  Crestor   4)AKI----acute kidney injury--suspect due to dehydration in the setting of diarrhea  --Creatinine is up to 2.44 from a baseline of between 0.7-0.8, -Creatinine improved with IV fluids -Okay to discontinue IV fluids and encourage oral intake ---renally adjust medications, avoid nephrotoxic agents / dehydration  / hypotension   5)HTN--- -BP has improved, okay to restart amlodipine -Continue to hold clonidine... Until blood pressure improves further --Continue metoprolol   6)H/o CVA--- no strokelike symptoms at this time continue aspirin and atorvastatin   7)COPD/Emphysema----no acute exacerbation at this time -Hypoxia has resolved -Continue bronchodilators and steroids   8) elevated lipase---346 -CT abdomen and pelvis without abnormalities of the pancreas -Clinically presentation not consistent with acute pancreatitis -Continue to monitor closely   9) anemia and thrombocytopenia--- anemia appears not to be new...  Thrombocytopenia appears to be new, thrombocytopenia has resolved -No bleeding concerns at this time -Monitor closely  10) hypophosphatemia/hypokalemia--- replaced... We will recheck in a.m.  11)Dysphagia----patient with history of prior stroke, swallowing difficulties making it difficult for patient to have significant/adequate oral intake -Encourage liquids -Speech pathology evaluation appreciated recommends Dysphagia 2 (fine chop);Thin liquid Liquids provided via: Cup;Straw Medication Administration: Crushed with puree -  12) anorexia/adult FTT -----give Megace for appetite stimulation  Disposition/Need for in-Hospital Stay- patient unable to be discharged at this time due to -COVID infection  and colitis with-Swallowing difficulties making it difficult for patient to have significant/adequate oral intake -Anticipate discharge home once oral intake improves -  Hypoxia has resolved -Dehydration resolving   Status is: Inpatient   Remains inpatient appropriate because:    Dispo: The patient is from: Home              Anticipated d/c is to: Home              Anticipated d/c date is: 2 days              Patient currently is not medically stable to d/c. Barriers: Not Clinically Stable-   Family Communication:    (patient is alert, awake and coherent)  -Discussed with patient's son [name]  DVT Prophylaxis  :   - SCDs   heparin injection 5,000 Units Start: 05/02/22 1415 SCDs Start: 05/02/22 1318 Place TED hose Start: 05/02/22 1318   Lab Results  Component Value Date   PLT 182 05/05/2022   Inpatient Medications  Scheduled Meds:  (feeding supplement) PROSource Plus  30 mL Oral TID BM   vitamin C  500 mg Oral Daily   aspirin EC  81 mg Oral Daily   atorvastatin  20 mg Oral Daily   cyanocobalamin  500 mcg Oral Daily   dextromethorphan-guaiFENesin  1 tablet Oral BID   feeding supplement  1 Container Oral TID BM   folic acid  1 mg Oral Daily   heparin  5,000 Units Subcutaneous Q8H   Ipratropium-Albuterol  1 puff Inhalation BID   linaclotide  72 mcg Oral Daily   metoprolol tartrate  5 mg Intravenous Q12H   molnupiravir EUA  4 capsule Oral BID   multivitamin with minerals  1 tablet Oral Daily   pantoprazole  40 mg Oral Daily   predniSONE  50 mg Oral Daily   sodium chloride flush  3 mL Intravenous Q12H   sodium chloride flush  3 mL Intravenous Q12H   zinc sulfate  220 mg Oral Daily   Continuous Infusions:  sodium chloride     levofloxacin (LEVAQUIN) IV 500 mg (05/04/22 1006)   metronidazole 500 mg (05/05/22 0929)   PRN Meds:.sodium chloride, acetaminophen **OR** acetaminophen, albuterol, chlorpheniramine-HYDROcodone, diphenhydrAMINE, guaiFENesin-dextromethorphan,  hydrALAZINE, HYDROcodone-acetaminophen, ondansetron **OR** ondansetron (ZOFRAN) IV, sodium chloride flush, traZODone   Anti-infectives (From admission, onward)    Start     Dose/Rate Route Frequency Ordered Stop   05/04/22 0915  levofloxacin (LEVAQUIN) IVPB 500 mg        500 mg 100 mL/hr over 60 Minutes Intravenous Every 48 hours 05/03/22 0917     05/02/22 1400  molnupiravir EUA (LAGEVRIO) capsule 800 mg        4 capsule Oral 2 times daily 05/02/22 1308 05/07/22 0959   05/02/22 1000  nirmatrelvir/ritonavir EUA (PAXLOVID) 3 tablet  Status:  Discontinued        3 tablet Oral 2 times daily 05/02/22 0914 05/02/22 0919   05/02/22 1000  nirmatrelvir/ritonavir EUA (renal dosing) (PAXLOVID) 2 tablet  Status:  Discontinued        2 tablet Oral 2 times daily 05/02/22 0919 05/02/22 1308   05/02/22 0915  levofloxacin (LEVAQUIN) IVPB 750 mg  Status:  Discontinued        750 mg 100 mL/hr over 90 Minutes Intravenous Every 48 hours 05/02/22 0914 05/03/22 0917   05/02/22 0915  metroNIDAZOLE (FLAGYL) IVPB 500 mg        500 mg 100 mL/hr over 60 Minutes Intravenous Every 12 hours 05/02/22 3474         Subjective: Lyndsie Wallman today has no fevers, no emesis,  No  chest pain,   No further diarrhea -Appetite remains poor -Patient having dysphagia/swallowing difficulties -Refusing to take medications and food at times  Objective: Vitals:   05/04/22 2100 05/05/22 0415 05/05/22 0710 05/05/22 1400  BP: (!) 152/67 (!) 147/89  (!) 154/81  Pulse: 84 (!) 106  94  Resp: 20 18  20   Temp: 98.9 F (37.2 C) 98.8 F (37.1 C)  98.7 F (37.1 C)  TempSrc: Oral     SpO2: 100% 100% 98% 100%  Weight:      Height:        Intake/Output Summary (Last 24 hours) at 05/05/2022 1930 Last data filed at 05/05/2022 1700 Gross per 24 hour  Intake 746.57 ml  Output --  Net 746.57 ml   Filed Weights   05/02/22 0628  Weight: 40 kg    Physical Exam  Gen:- Awake Alert,  in no apparent distress  HEENT:- [place].AT, No  sclera icterus Neck-Supple Neck,No JVD,.  Lungs-improving breath sounds, no wheezing  CV- S1, S2 normal, regular  Abd-  +ve B.Sounds, Abd Soft, No tenderness,    Extremity/Skin:- No  edema, pedal pulses present  Psych-affect is appropriate, oriented x3 Neuro-generalized weakness, no new focal deficits, no tremors  Data Reviewed: I have personally reviewed following labs and imaging studies  CBC: Recent Labs  Lab 05/02/22 0705 05/03/22 0442 05/04/22 0517 05/05/22 0420  WBC 12.9* 15.9* 21.4* 21.2*  NEUTROABS 10.5* 14.0* 19.8* 19.5*  HGB 13.8 11.2* 10.9* 11.1*  HCT 43.3 34.8* 33.5* 35.0*  MCV 99.8 100.0 99.7 100.3*  PLT 147* 143* 168 182   Basic Metabolic Panel: Recent Labs  Lab 05/02/22 0705 05/03/22 0442 05/04/22 0517 05/05/22 0420  NA 139 144 141 140  K 3.7 3.1* 3.2* 3.5  CL 102 114* 114* 115*  CO2 26 23 21* 20*  GLUCOSE 180* 146* 152* 147*  BUN 31* 24* 18 13  CREATININE 2.44* 1.62* 1.33* 1.07*  CALCIUM 9.0 8.4* 7.9* 8.0*  MG  --  1.9 1.7 2.0  PHOS  --  2.3* 2.1* 2.3*   GFR: Estimated Creatinine Clearance: 30 mL/min (A) (by C-G formula based on SCr of 1.07 mg/dL (H)). Liver Function Tests: Recent Labs  Lab 05/02/22 0705 05/03/22 0442 05/04/22 0517 05/05/22 0420  AST 91* 160* 181* 135*  ALT 47* 72* 85* 78*  ALKPHOS 116 96 93 92  BILITOT 1.0 1.0 0.9 0.9  PROT 8.0 6.5 6.3* 6.2*  ALBUMIN 3.7 3.0* 2.9* 3.0*   Recent Results (from the past 240 hour(s))  Resp Panel by RT-PCR (Flu A&B, Covid) Anterior Nasal Swab     Status: Abnormal   Collection Time: 05/02/22  7:28 AM   Specimen: Anterior Nasal Swab  Result Value Ref Range Status   SARS Coronavirus 2 by RT PCR POSITIVE (A) NEGATIVE Final    Comment: (NOTE) SARS-CoV-2 target nucleic acids are DETECTED.  The SARS-CoV-2 RNA is generally detectable in upper respiratory specimens during the acute phase of infection. Positive results are indicative of the presence of the identified virus, but do not rule out  bacterial infection or co-infection with other pathogens not detected by the test. Clinical correlation with patient history and other diagnostic information is necessary to determine patient infection status. The expected result is Negative.  Fact Sheet for Patients: 07/02/22  Fact Sheet for Healthcare Providers: BloggerCourse.com  This test is not yet approved or cleared by the [employer] FDA and  has been authorized for detection and/or diagnosis of SARS-CoV-2 by FDA under an  Emergency Use Authorization (EUA).  This EUA will remain in effect (meaning this test can be used) for the duration of  the COVID-19 declaration under Section 564(b)(1) of the A ct, 21 U.S.C. section 360bbb-3(b)(1), unless the authorization is terminated or revoked sooner.     Influenza A by PCR NEGATIVE NEGATIVE Final   Influenza B by PCR NEGATIVE NEGATIVE Final    Comment: (NOTE) The Xpert Xpress SARS-CoV-2/FLU/RSV plus assay is intended as an aid in the diagnosis of influenza from Nasopharyngeal swab specimens and should not be used as a sole basis for treatment. Nasal washings and aspirates are unacceptable for Xpert Xpress SARS-CoV-2/FLU/RSV testing.  Fact Sheet for Patients: EntrepreneurPulse.com.au  Fact Sheet for Healthcare Providers: IncredibleEmployment.be  This test is not yet approved or cleared by the [place] FDA and has been authorized for detection and/or diagnosis of SARS-CoV-2 by FDA under an Emergency Use Authorization (EUA). This EUA will remain in effect (meaning this test can be used) for the duration of the COVID-19 declaration under Section 564(b)(1) of the Act, 21 U.S.C. section 360bbb-3(b)(1), unless the authorization is terminated or revoked.  Performed at [facility], [address] [place], [place] [address]     Radiology Studies: No results found.   Scheduled  Meds:  (feeding supplement) PROSource Plus  30 mL Oral TID BM   vitamin C  500 mg Oral Daily   aspirin EC  81 mg Oral Daily   atorvastatin  20 mg Oral Daily   cyanocobalamin  500 mcg Oral Daily   dextromethorphan-guaiFENesin  1 tablet Oral BID   feeding supplement  1 Container Oral TID BM   folic acid  1 mg Oral Daily   heparin  5,000 Units Subcutaneous Q8H   Ipratropium-Albuterol  1 puff Inhalation BID   linaclotide  72 mcg Oral Daily   metoprolol tartrate  5 mg Intravenous Q12H   molnupiravir EUA  4 capsule Oral BID   multivitamin with minerals  1 tablet Oral Daily   pantoprazole  40 mg Oral Daily   predniSONE  50 mg Oral Daily   sodium chloride flush  3 mL Intravenous Q12H   sodium chloride flush  3 mL Intravenous Q12H   zinc sulfate  220 mg Oral Daily   Continuous Infusions:  sodium chloride     levofloxacin (LEVAQUIN) IV 500 mg (05/04/22 1006)   metronidazole 500 mg (05/05/22 0929)     LOS: 3 days    Roxan Hockey M.D on 05/05/2022 at 7:30 PM  Go to [email or web address] - for contact info  [facility] - Office  [phone]  If 7PM-7AM, please contact night-coverage [email or web address] 05/05/2022, 7:30 PM

## 2022-05-05 NOTE — Progress Notes (Signed)
[facility] night coverage note:  Pt reportedly refused all night time meds due to swallowing issue, even crushed. 1) switch metoprolol to IV 5mg  Q12H scheduled for the moment. 2) cant switch molnuparavir nor mucinex to IV, no O2 requirement to indicate remdesivir for the moment

## 2022-05-05 NOTE — Progress Notes (Signed)
Patient refused all morning medication states " I can't swallow them they get stuck in my throat". Also c/o lower abdominal pain , notified Dr. Marisa Severin, she states she would not be able to take PO hydrocodone, at this time.

## 2022-05-06 DIAGNOSIS — Z8673 Personal history of transient ischemic attack (TIA), and cerebral infarction without residual deficits: Secondary | ICD-10-CM

## 2022-05-06 DIAGNOSIS — R197 Diarrhea, unspecified: Secondary | ICD-10-CM

## 2022-05-06 DIAGNOSIS — J9601 Acute respiratory failure with hypoxia: Secondary | ICD-10-CM

## 2022-05-06 DIAGNOSIS — I10 Essential (primary) hypertension: Secondary | ICD-10-CM

## 2022-05-06 DIAGNOSIS — U071 COVID-19: Secondary | ICD-10-CM | POA: Diagnosis not present

## 2022-05-06 DIAGNOSIS — N179 Acute kidney failure, unspecified: Secondary | ICD-10-CM | POA: Diagnosis not present

## 2022-05-06 DIAGNOSIS — R112 Nausea with vomiting, unspecified: Secondary | ICD-10-CM

## 2022-05-06 LAB — RENAL FUNCTION PANEL
Albumin: 2.9 g/dL — ABNORMAL LOW (ref 3.5–5.0)
Anion gap: 5 (ref 5–15)
BUN: 13 mg/dL (ref 8–23)
CO2: 26 mmol/L (ref 22–32)
Calcium: 8 mg/dL — ABNORMAL LOW (ref 8.9–10.3)
Chloride: 112 mmol/L — ABNORMAL HIGH (ref 98–111)
Creatinine, Ser: 1.03 mg/dL — ABNORMAL HIGH (ref 0.44–1.00)
GFR, Estimated: 58 mL/min — ABNORMAL LOW (ref >60)
Glucose, Bld: 115 mg/dL — ABNORMAL HIGH (ref 70–99)
Phosphorus: 1.2 mg/dL — ABNORMAL LOW (ref 2.5–4.6)
Potassium: 3.1 mmol/L — ABNORMAL LOW (ref 3.5–5.1)
Sodium: 143 mmol/L (ref 135–145)

## 2022-05-06 LAB — GLUCOSE, CAPILLARY
Glucose-Capillary: 115 mg/dL — ABNORMAL HIGH (ref 70–99)
Glucose-Capillary: 131 mg/dL — ABNORMAL HIGH (ref 70–99)
Glucose-Capillary: 137 mg/dL — ABNORMAL HIGH (ref 70–99)
Glucose-Capillary: 155 mg/dL — ABNORMAL HIGH (ref 70–99)

## 2022-05-06 MED ORDER — METHYLPREDNISOLONE SODIUM SUCC 40 MG IJ SOLR
40.0000 mg | Freq: Two times a day (BID) | INTRAMUSCULAR | Status: DC
  Administered 2022-05-06 – 2022-05-08 (×4): 40 mg via INTRAVENOUS
  Filled 2022-05-06 (×6): qty 1

## 2022-05-06 MED ORDER — METOPROLOL TARTRATE 5 MG/5ML IV SOLN
5.0000 mg | Freq: Three times a day (TID) | INTRAVENOUS | Status: DC
  Administered 2022-05-06 – 2022-05-08 (×6): 5 mg via INTRAVENOUS
  Filled 2022-05-06 (×7): qty 5

## 2022-05-06 MED ORDER — IPRATROPIUM-ALBUTEROL 20-100 MCG/ACT IN AERS
1.0000 | INHALATION_SPRAY | RESPIRATORY_TRACT | Status: DC | PRN
Start: 2022-05-06 — End: 2022-05-09

## 2022-05-06 MED ORDER — PANTOPRAZOLE SODIUM 40 MG IV SOLR
40.0000 mg | INTRAVENOUS | Status: DC
  Administered 2022-05-06 – 2022-05-07 (×2): 40 mg via INTRAVENOUS
  Filled 2022-05-06 (×2): qty 10

## 2022-05-06 NOTE — Progress Notes (Signed)
Patient refusing all PO medications and physical assessment.  Encouragement given to patient. Nursing staff will continue to provide support and monitor pt. ADL'S refused, Products left at bedside

## 2022-05-06 NOTE — Progress Notes (Signed)
PROGRESS NOTE     [name], is a 71 y.o. female, DOB - Jan 04, 1951, [id number]  Admit date - 05/02/2022   Admitting Physician Courage Denton Brick, MD  Outpatient Primary MD for the patient is April, [name], MD  LOS - 4  Chief Complaint  Patient presents with   Emesis        Brief Narrative:  71 y.o. female with PMH of CVA, HLD, HTN , poor visual acuity, history of CAD/prior MI, GERD and depressive disorder admitted with acute hypoxic respiratory failure secondary to COVID-19 infection, as well as concerns for colitis, poor oral intake/dehydration and AKI   -Assessment and Plan:  1)Acute hypoxic respiratory failure secondary to COVID-19 infection--- The treatment plan and use of medications  for treatment of COVID-19 infection and possible side effects were discussed with patient and her son [name] -Hypoxia resolved -----Patient and son verbalizes understanding and agrees to treatment protocols   --Patient is positive for COVID-19 infection, patient is tachypneic/hypoxic and requiring continuous supplemental oxygen---patient meets criteria for initiation of Molnupiravir AND Steroid therapy per protocol. --Check and trend inflammatory markers including D-dimer, ferritin and  CRP--- --Attempt to maintain euvolemic state --Continue zinc and vitamin C as ordered -Albuterol/Combivent inhaler as needed -Continue to follow inflammatory markers   2)Acute Colitis--- -abnormal findings appreciated on CT abdomen and pelvis without contrast--- (images suggesting severe acute colitis predominantly involving the mid to distal transverse colon) -Patient presented with leukocytosis, abdominal pain and vomiting and diarrhea -Continue Levaquin and Flagyl for total of 7 days (pt has hx of penicillin allergy)--- -???  COVID-related colitis versus bacterial colitis -Leukocytosis persist suspect partly due to use of steroids -No further BMs (patient is not really eating much) -Continue  supportive care.   3)H/o CAD/Prior MI--- no ACS type symptoms at this time --Troponin 34 , repeat 30 -EKG sinus rhythm with PVCs -Continue metoprolol, aspirin and  Crestor   4)AKI----acute kidney injury--suspect due to dehydration in the setting of diarrhea  --Creatinine is up to 2.44 from a baseline of between 0.7-0.8, -Creatinine improved with IV fluids -Okay to discontinue IV fluids and encourage oral intake ---renally adjust medications, avoid nephrotoxic agents / dehydration  / hypotension -Continue to follow renal function trend.   5)HTN---  -Continue the use of amlodipine and metoprolol -Continue to follow vital signs and further adjust antihypertensive regimen as needed.   6)H/o CVA---  -no strokelike symptoms at this time  -Continue aspirin and Lipitor for secondary prevention.  -Continue risk factor modification.   7)COPD/Emphysema----no acute exacerbation at this time -Hypoxia has resolved -Continue bronchodilators and steroids   8) elevated lipase---346 -CT abdomen and pelvis without abnormalities of the pancreas -Clinically presentation not consistent with acute pancreatitis; and most likely associated with viral infection in the setting of acute phase reactant.   9) anemia and thrombocytopenia--- anemia appears not to be new...  Thrombocytopenia appears to be new, and most likely associated with viral infection. -No bleeding concerns at this time -Continue to monitor closely hemoglobin and platelet count.  10) hypophosphatemia/hypokalemia---  -Repleted -Continue to follow trend and further replete as needed.  11)Dysphagia----patient with history of prior stroke, swallowing difficulties making it difficult for patient to have significant/adequate oral intake -Encourage liquids -Speech pathology evaluation appreciated; will follow rec's -Dysphagia 2 (fine chop);Thin liquid -Liquids provided via: Cup;Straw Medication Administration: Crushed with puree   12)  anorexia/adult FTT ----- -continue the use of Megace for appetite stimulation -Patient has been encouraged to increase oral intake.  Disposition/Need for  in-Hospital Stay- patient unable to be discharged at this time due to -COVID infection and colitis with-Swallowing difficulties making it difficult for patient to have significant/adequate oral intake.  -Anticipate discharge home once oral intake improves -Hypoxia has resolved -Dehydration resolving   Status is: Inpatient   Remains inpatient appropriate because:    Dispo: The patient is from: Home              Anticipated d/c is to: Home              Anticipated d/c date is: 2 days              Patient currently is not medically stable to d/c. Barriers: Not Clinically Stable-   Family Communication:    (patient is alert, awake and coherent)  -Discussed with patient's son [name]  DVT Prophylaxis  :   - SCDs   heparin injection 5,000 Units Start: 05/02/22 1415 SCDs Start: 05/02/22 1318 Place TED hose Start: 05/02/22 1318   Lab Results  Component Value Date   PLT 182 05/05/2022   Inpatient Medications  Scheduled Meds:  (feeding supplement) PROSource Plus  30 mL Oral TID BM   amLODipine  5 mg Oral Daily   vitamin C  500 mg Oral Daily   aspirin EC  81 mg Oral Daily   atorvastatin  20 mg Oral Daily   cyanocobalamin  500 mcg Oral Daily   dextromethorphan-guaiFENesin  1 tablet Oral BID   feeding supplement  1 Container Oral TID BM   folic acid  1 mg Oral Daily   heparin  5,000 Units Subcutaneous Q8H   Ipratropium-Albuterol  1 puff Inhalation BID   linaclotide  72 mcg Oral Daily   megestrol  400 mg Oral BID   metoprolol tartrate  5 mg Intravenous Q12H   molnupiravir EUA  4 capsule Oral BID   multivitamin with minerals  1 tablet Oral Daily   pantoprazole  40 mg Oral Daily   predniSONE  50 mg Oral Daily   sodium chloride flush  3 mL Intravenous Q12H   sodium chloride flush  3 mL Intravenous Q12H   zinc sulfate  220 mg Oral  Daily   Continuous Infusions:  sodium chloride     levofloxacin (LEVAQUIN) IV 500 mg (05/06/22 1338)   metronidazole 500 mg (05/06/22 1317)   PRN Meds:.sodium chloride, acetaminophen **OR** acetaminophen, albuterol, chlorpheniramine-HYDROcodone, diphenhydrAMINE, guaiFENesin-dextromethorphan, hydrALAZINE, HYDROcodone-acetaminophen, ondansetron **OR** ondansetron (ZOFRAN) IV, sodium chloride flush, traZODone   Anti-infectives (From admission, onward)    Start     Dose/Rate Route Frequency Ordered Stop   05/04/22 0915  levofloxacin (LEVAQUIN) IVPB 500 mg        500 mg 100 mL/hr over 60 Minutes Intravenous Every 48 hours 05/03/22 0917     05/02/22 1400  molnupiravir EUA (LAGEVRIO) capsule 800 mg        4 capsule Oral 2 times daily 05/02/22 1308 05/07/22 0959   05/02/22 1000  nirmatrelvir/ritonavir EUA (PAXLOVID) 3 tablet  Status:  Discontinued        3 tablet Oral 2 times daily 05/02/22 0914 05/02/22 0919   05/02/22 1000  nirmatrelvir/ritonavir EUA (renal dosing) (PAXLOVID) 2 tablet  Status:  Discontinued        2 tablet Oral 2 times daily 05/02/22 0919 05/02/22 1308   05/02/22 0915  levofloxacin (LEVAQUIN) IVPB 750 mg  Status:  Discontinued        750 mg 100 mL/hr over 90 Minutes Intravenous Every 48 hours 05/02/22  [id number] 05/03/22 0917   05/02/22 0915  metroNIDAZOLE (FLAGYL) IVPB 500 mg        500 mg 100 mL/hr over 60 Minutes Intravenous Every 12 hours 05/02/22 0914         Subjective: Oran Rein on today's examination status represented today has no fevers, no emesis,  No chest pain,   No further diarrhea -Appetite remains poor -Patient having dysphagia/swallowing difficulties -Refusing to take medications and food at times  Objective: Vitals:   05/05/22 2039 05/06/22 0520 05/06/22 0717 05/06/22 1251  BP: (!) 146/62 (!) 144/83  132/74  Pulse: (!) 104 75  80  Resp: 17 16  18   Temp: 98.8 F (37.1 C) 98.8 F (37.1 C)  99.9 F (37.7 C)  TempSrc:      SpO2: 97% 100% 100%  100%  Weight:      Height:        Intake/Output Summary (Last 24 hours) at 05/06/2022 1516 Last data filed at 05/05/2022 1700 Gross per 24 hour  Intake 746.57 ml  Output --  Net 746.57 ml   Filed Weights   05/02/22 0628  Weight: 40 kg    Physical Exam General exam: Alert, awake, oriented x 3; on 2 L of oxygen supplementation for comfort; no fever, reports still having difficulty swallowing. Respiratory system: Stated rhonchi bilaterally; no using accessory muscle.  Excellent saturation on 2 L nasal cannula supplementation. Cardiovascular system:RRR. No rubs or gallops; no JVD Gastrointestinal system: Abdomen is nondistended, soft and nontender. No organomegaly or masses felt. Normal bowel sounds heard. Central nervous system: Alert and oriented. No focal neurological deficits. Extremities: No nauseous or clubbing. Skin: No petechiae. Psychiatry: Judgement and insight appear normal. Mood & affect appropriate.   Data Reviewed: I have personally reviewed following labs and imaging studies  CBC: Recent Labs  Lab 05/02/22 0705 05/03/22 0442 05/04/22 0517 05/05/22 0420  WBC 12.9* 15.9* 21.4* 21.2*  NEUTROABS 10.5* 14.0* 19.8* 19.5*  HGB 13.8 11.2* 10.9* 11.1*  HCT 43.3 34.8* 33.5* 35.0*  MCV 99.8 100.0 99.7 100.3*  PLT 147* 143* 168 [id number]   Basic Metabolic Panel: Recent Labs  Lab 05/02/22 0705 05/03/22 0442 05/04/22 0517 05/05/22 0420 05/06/22 0429  NA 139 144 141 140 143  K 3.7 3.1* 3.2* 3.5 3.1*  CL 102 114* 114* 115* 112*  CO2 26 23 21* 20* 26  GLUCOSE 180* 146* 152* 147* 115*  BUN 31* 24* 18 13 13   CREATININE 2.44* 1.62* 1.33* 1.07* 1.03*  CALCIUM 9.0 8.4* 7.9* 8.0* 8.0*  MG  --  1.9 1.7 2.0  --   PHOS  --  2.3* 2.1* 2.3* 1.2*   GFR: Estimated Creatinine Clearance: 31.2 mL/min (A) (by C-G formula based on SCr of 1.03 mg/dL (H)).  Liver Function Tests: Recent Labs  Lab 05/02/22 0705 05/03/22 0442 05/04/22 0517 05/05/22 0420 05/06/22 0429  AST 91* 160*  181* 135*  --   ALT 47* 72* 85* 78*  --   ALKPHOS 116 96 93 92  --   BILITOT 1.0 1.0 0.9 0.9  --   PROT 8.0 6.5 6.3* 6.2*  --   ALBUMIN 3.7 3.0* 2.9* 3.0* 2.9*   Recent Results (from the past 240 hour(s))  Resp Panel by RT-PCR (Flu A&B, Covid) Anterior Nasal Swab     Status: Abnormal   Collection Time: 05/02/22  7:28 AM   Specimen: Anterior Nasal Swab  Result Value Ref Range Status   SARS Coronavirus 2 by RT PCR POSITIVE (A) NEGATIVE  Final    Comment: (NOTE) SARS-CoV-2 target nucleic acids are DETECTED.  The SARS-CoV-2 RNA is generally detectable in upper respiratory specimens during the acute phase of infection. Positive results are indicative of the presence of the identified virus, but do not rule out bacterial infection or co-infection with other pathogens not detected by the test. Clinical correlation with patient history and other diagnostic information is necessary to determine patient infection status. The expected result is Negative.  Fact Sheet for Patients: BloggerCourse.com  Fact Sheet for Healthcare Providers: SeriousBroker.it  This test is not yet approved or cleared by the [place] FDA and  has been authorized for detection and/or diagnosis of SARS-CoV-2 by FDA under an Emergency Use Authorization (EUA).  This EUA will remain in effect (meaning this test can be used) for the duration of  the COVID-19 declaration under Section 564(b)(1) of the A ct, 21 U.S.C. section 360bbb-3(b)(1), unless the authorization is terminated or revoked sooner.     Influenza A by PCR NEGATIVE NEGATIVE Final   Influenza B by PCR NEGATIVE NEGATIVE Final    Comment: (NOTE) The Xpert Xpress SARS-CoV-2/FLU/RSV plus assay is intended as an aid in the diagnosis of influenza from Nasopharyngeal swab specimens and should not be used as a sole basis for treatment. Nasal washings and aspirates are unacceptable for Xpert Xpress  SARS-CoV-2/FLU/RSV testing.  Fact Sheet for Patients: BloggerCourse.com  Fact Sheet for Healthcare Providers: SeriousBroker.it  This test is not yet approved or cleared by the [place] FDA and has been authorized for detection and/or diagnosis of SARS-CoV-2 by FDA under an Emergency Use Authorization (EUA). This EUA will remain in effect (meaning this test can be used) for the duration of the COVID-19 declaration under Section 564(b)(1) of the Act, 21 U.S.C. section 360bbb-3(b)(1), unless the authorization is terminated or revoked.  Performed at [facility], [address], [place], [place] [address]     Radiology Studies: No results found.   Scheduled Meds:  (feeding supplement) PROSource Plus  30 mL Oral TID BM   amLODipine  5 mg Oral Daily   vitamin C  500 mg Oral Daily   aspirin EC  81 mg Oral Daily   atorvastatin  20 mg Oral Daily   cyanocobalamin  500 mcg Oral Daily   dextromethorphan-guaiFENesin  1 tablet Oral BID   feeding supplement  1 Container Oral TID BM   folic acid  1 mg Oral Daily   heparin  5,000 Units Subcutaneous Q8H   Ipratropium-Albuterol  1 puff Inhalation BID   linaclotide  72 mcg Oral Daily   megestrol  400 mg Oral BID   metoprolol tartrate  5 mg Intravenous Q12H   molnupiravir EUA  4 capsule Oral BID   multivitamin with minerals  1 tablet Oral Daily   pantoprazole  40 mg Oral Daily   predniSONE  50 mg Oral Daily   sodium chloride flush  3 mL Intravenous Q12H   sodium chloride flush  3 mL Intravenous Q12H   zinc sulfate  220 mg Oral Daily   Continuous Infusions:  sodium chloride     levofloxacin (LEVAQUIN) IV 500 mg (05/06/22 1338)   metronidazole 500 mg (05/06/22 1317)     LOS: 4 days    Vassie Loll M.D on 05/06/2022 at 3:16 PM  Go to [email or web address] - for contact info  [facility] - Office  [phone]  If 7PM-7AM, please contact night-coverage [email or web address] 05/06/2022,  3:16 PM

## 2022-05-06 NOTE — Progress Notes (Signed)
Patient HR irregular and rate increased to 160 at rest. BP 160/80. Patient refusing all PO medications and physical assessment. When asking the patient why she is refusing medications she became agitated. Encouragement given to patient. Secure chat sent to Dr. Carren Rang.

## 2022-05-07 DIAGNOSIS — U071 COVID-19: Secondary | ICD-10-CM | POA: Diagnosis not present

## 2022-05-07 DIAGNOSIS — I10 Essential (primary) hypertension: Secondary | ICD-10-CM | POA: Diagnosis not present

## 2022-05-07 DIAGNOSIS — N179 Acute kidney failure, unspecified: Secondary | ICD-10-CM | POA: Diagnosis not present

## 2022-05-07 DIAGNOSIS — J9601 Acute respiratory failure with hypoxia: Secondary | ICD-10-CM | POA: Diagnosis not present

## 2022-05-07 LAB — GLUCOSE, CAPILLARY
Glucose-Capillary: 209 mg/dL — ABNORMAL HIGH (ref 70–99)
Glucose-Capillary: 163 mg/dL — ABNORMAL HIGH (ref 70–99)
Glucose-Capillary: 220 mg/dL — ABNORMAL HIGH (ref 70–99)
Glucose-Capillary: 246 mg/dL — ABNORMAL HIGH (ref 70–99)
Glucose-Capillary: 175 mg/dL — ABNORMAL HIGH (ref 70–99)

## 2022-05-07 LAB — HEMOGLOBIN A1C
Hgb A1c MFr Bld: 5.9 % — ABNORMAL HIGH (ref 4.8–5.6)
Mean Plasma Glucose: 122.63 mg/dL

## 2022-05-07 MED ORDER — INSULIN ASPART 100 UNIT/ML IJ SOLN
0.0000 [IU] | Freq: Every day | INTRAMUSCULAR | Status: DC
  Administered 2022-05-08: 2 [IU] via SUBCUTANEOUS

## 2022-05-07 MED ORDER — INSULIN ASPART 100 UNIT/ML IJ SOLN
0.0000 [IU] | Freq: Three times a day (TID) | INTRAMUSCULAR | Status: DC
  Administered 2022-05-08: 2 [IU] via SUBCUTANEOUS
  Administered 2022-05-08: 1 [IU] via SUBCUTANEOUS

## 2022-05-07 MED ORDER — POTASSIUM CHLORIDE 2 MEQ/ML IV SOLN
INTRAVENOUS | Status: AC
  Filled 2022-05-07 (×2): qty 1000

## 2022-05-07 MED ORDER — DEXTROSE-NACL 10-0.45 % IV SOLN: INTRAVENOUS | Status: DC

## 2022-05-07 NOTE — Plan of Care (Signed)
  Problem: Education: Goal: Knowledge of General Education information will improve Description: Including pain rating scale, medication(s)/side effects and non-pharmacologic comfort measures Outcome: Progressing   Problem: Health Behavior/Discharge Planning: Goal: Ability to manage health-related needs will improve Outcome: Progressing   Problem: Nutrition: Goal: Adequate nutrition will be maintained Outcome: Not Progressing   Problem: Coping: Goal: Level of anxiety will decrease Outcome: Progressing   Problem: Elimination: Goal: Will not experience complications related to bowel motility Outcome: Progressing Goal: Will not experience complications related to urinary retention Outcome: Progressing   Problem: Safety: Goal: Ability to remain free from injury will improve Outcome: Progressing   Problem: Skin Integrity: Goal: Risk for impaired skin integrity will decrease Outcome: Progressing   Problem: Education: Goal: Knowledge of risk factors and measures for prevention of condition will improve Outcome: Progressing

## 2022-05-07 NOTE — Care Management Important Message (Addendum)
Important Message  Patient Details  Name: [name] MRN: [id number] Date of Birth: 12-05-1950   Medicare Important Message Given:  Other (see comment) (patient on isolation, unable to reach by phone at [phone], [phone], copy mailed to address on file) [phone] son, Tish Men, no answer    Corey Harold 05/07/2022, 2:47 PM

## 2022-05-07 NOTE — Progress Notes (Signed)
PROGRESS NOTE     [name], is a 71 y.o. female, DOB - [birth year], [id number]  Admit date - 05/02/2022   Admitting Physician Courage Denton Brick, MD  Outpatient Primary MD for the patient is [name], [name], MD  LOS - 5  Chief Complaint  Patient presents with   Emesis        Brief Narrative:  71 y.o. female with PMH of CVA, HLD, HTN , poor visual acuity, history of CAD/prior MI, GERD and depressive disorder admitted with acute hypoxic respiratory failure secondary to COVID-19 infection, as well as concerns for colitis, poor oral intake/dehydration and AKI   -Assessment and Plan:  1)Acute hypoxic respiratory failure secondary to COVID-19 infection--- The treatment plan and use of medications  for treatment of COVID-19 infection and possible side effects were discussed with patient and her son [name] -Hypoxia resolved. -Patient is positive for COVID-19 infection, no longer requiring oxygen supplementation.  Has completed treatment with multiple BW -Continue steroids management. --Attempt to maintain euvolemic state --Continue zinc and vitamin C as ordered -Albuterol/Combivent inhaler as needed -Continue to follow inflammatory markers as needed. -Afebrile and overall improved.   2)Acute Colitis--- -abnormal findings appreciated on CT abdomen and pelvis without contrast--- (images suggesting severe acute colitis predominantly involving the mid to distal transverse colon) -Patient presented with leukocytosis, abdominal pain and vomiting and diarrhea -Continue Levaquin and Flagyl for total of 7 days (pt has hx of penicillin allergy)--- -???  COVID-related colitis versus bacterial colitis -Leukocytosis persist suspect partly due to use of steroids -No further BMs (patient is not really eating much) -Continue supportive care and maintain adequate hydration..   3)H/o CAD/Prior MI--- no ACS type symptoms at this time --Troponin 34 , repeat 30 -EKG sinus rhythm with  PVCs -Continue metoprolol, aspirin and  Crestor   4)AKI----acute kidney injury--suspect due to dehydration in the setting of diarrhea  --Creatinine is up to 2.44 from a baseline of between 0.7-0.8, -Creatinine improved with IV fluids -Okay to discontinue IV fluids and encourage oral intake ---renally adjust medications, avoid nephrotoxic agents / dehydration  / hypotension -Continue to follow renal function trend.   5)HTN---  -Continue the use of amlodipine and metoprolol -Continue to follow vital signs and further adjust antihypertensive regimen as needed. -Currently limited options to control blood pressure given inability to swallow meds.   6)H/o CVA---  -no strokelike symptoms at this time  -Continue aspirin and Lipitor for secondary prevention.  -Continue risk factor modification.   7)COPD/Emphysema----no acute exacerbation at this time -Hypoxia has resolved -Continue bronchodilators and steroids   8) elevated lipase---346 -CT abdomen and pelvis without abnormalities of the pancreas -Clinically presentation not consistent with acute pancreatitis; and most likely associated with viral infection in the setting of acute phase reactant. -Patient denies nausea and vomiting.   9) anemia and thrombocytopenia--- anemia appears not to be new...  Thrombocytopenia appears to be new, and most likely associated with viral infection. -Continue to monitor closely hemoglobin and platelet count. -No overt bleeding appreciated.  10) hypophosphatemia/hypokalemia---  -Continue to follow ultralights and further replete as needed.  11)Dysphagia----patient with history of prior stroke, swallowing difficulties making it difficult for patient to have significant/adequate oral intake -Encourage liquids -Speech pathology evaluation appreciated; will follow rec's -Dysphagia 2 (fine chop);Thin liquid -Liquids provided via: Cup;Straw Medication Administration: Crushed with puree -Patient still  reporting sensation of food getting stuck in her chest and definitely no eating or drinking sufficient; GI consulted for endoscopic evaluation and if needed dilatation. -Continue  PPI.   12) anorexia/adult FTT ----- -continue the use of Megace for appetite stimulation -Patient has been encouraged to increase oral intake. -Mainly driven by difficulty swallowing -GI service has been consulted to assist with potential endoscopic evaluation. -Continue PPI.  Disposition/Need for in-Hospital Stay- patient unable to be discharged at this time due to -COVID infection and colitis with-Swallowing difficulties making it difficult for patient to have significant/adequate oral intake.  -Anticipate discharge home once oral intake improves -Hypoxia has resolved -Dehydration resolving -Still requiring electrolyte repletion -Gastroenterology has been consulted for possible endoscopic evaluation given ongoing dysphagia with a sensation of food getting stuck in her chest.   Status is: Inpatient   Remains inpatient appropriate because:    Dispo: The patient is from: Home              Anticipated d/c is to: Home              Anticipated d/c date is: 1-2 days              Patient currently is not medically stable to d/c.  Barriers: Not Clinically Stable-   Family Communication:    (patient is alert, awake and coherent)  -Discussed with patient's son [name]  DVT Prophylaxis  :   - SCDs   heparin injection 5,000 Units Start: 05/02/22 1415 SCDs Start: 05/02/22 1318 Place TED hose Start: 05/02/22 1318   Lab Results  Component Value Date   PLT 182 05/05/2022   Inpatient Medications  Scheduled Meds:  (feeding supplement) PROSource Plus  30 mL Oral TID BM   amLODipine  5 mg Oral Daily   vitamin C  500 mg Oral Daily   aspirin EC  81 mg Oral Daily   atorvastatin  20 mg Oral Daily   cyanocobalamin  500 mcg Oral Daily   dextromethorphan-guaiFENesin  1 tablet Oral BID   feeding supplement  1 Container  Oral TID BM   folic acid  1 mg Oral Daily   heparin  5,000 Units Subcutaneous Q8H   linaclotide  72 mcg Oral Daily   megestrol  400 mg Oral BID   methylPREDNISolone (SOLU-MEDROL) injection  40 mg Intravenous Q12H   metoprolol tartrate  5 mg Intravenous Q8H   multivitamin with minerals  1 tablet Oral Daily   pantoprazole (PROTONIX) IV  40 mg Intravenous Q24H   sodium chloride flush  3 mL Intravenous Q12H   sodium chloride flush  3 mL Intravenous Q12H   zinc sulfate  220 mg Oral Daily   Continuous Infusions:  sodium chloride 10 mL/hr at 05/07/22 0454   levofloxacin (LEVAQUIN) IV Stopped (05/06/22 1721)   metronidazole 500 mg (05/07/22 0748)   PRN Meds:.sodium chloride, acetaminophen **OR** acetaminophen, albuterol, chlorpheniramine-HYDROcodone, diphenhydrAMINE, guaiFENesin-dextromethorphan, hydrALAZINE, HYDROcodone-acetaminophen, Ipratropium-Albuterol, ondansetron **OR** ondansetron (ZOFRAN) IV, sodium chloride flush, traZODone   Anti-infectives (From admission, onward)    Start     Dose/Rate Route Frequency Ordered Stop   05/04/22 0915  levofloxacin (LEVAQUIN) IVPB 500 mg        500 mg 100 mL/hr over 60 Minutes Intravenous Every 48 hours 05/03/22 0917     05/02/22 1400  molnupiravir EUA (LAGEVRIO) capsule 800 mg        4 capsule Oral 2 times daily 05/02/22 1308 05/07/22 0959   05/02/22 1000  nirmatrelvir/ritonavir EUA (PAXLOVID) 3 tablet  Status:  Discontinued        3 tablet Oral 2 times daily 05/02/22 0914 05/02/22 0919   05/02/22 1000  nirmatrelvir/ritonavir  EUA (renal dosing) (PAXLOVID) 2 tablet  Status:  Discontinued        2 tablet Oral 2 times daily 05/02/22 0919 05/02/22 1308   05/02/22 0915  levofloxacin (LEVAQUIN) IVPB 750 mg  Status:  Discontinued        750 mg 100 mL/hr over 90 Minutes Intravenous Every 48 hours 05/02/22 0914 05/03/22 0917   05/02/22 0915  metroNIDAZOLE (FLAGYL) IVPB 500 mg        500 mg 100 mL/hr over 60 Minutes Intravenous Every 12 hours 05/02/22  0914         Subjective: Oran Rein no fever, no chest pain, no nausea, no vomiting.  Still having difficulty swallowing and maintaining adequate nutrition/oral hydration.  Objective: Vitals:   05/06/22 2034 05/06/22 2121 05/07/22 0549 05/07/22 1310  BP: 125/63 93/62 125/66 121/65  Pulse: 98 91 (!) 105 (!) 106  Resp: 20 18 20 18   Temp: 98.8 F (37.1 C) 98.5 F (36.9 C) 98.9 F (37.2 C) 98.5 F (36.9 C)  TempSrc:    Oral  SpO2: 98% 92% 97% 100%  Weight:      Height:        Intake/Output Summary (Last 24 hours) at 05/07/2022 1619 Last data filed at 05/07/2022 1500 Gross per 24 hour  Intake 1378.64 ml  Output --  Net 1378.64 ml   Filed Weights   05/02/22 0628  Weight: 40 kg    Physical Exam General exam: Alert, awake, oriented x 3; demonstrating no need for oxygen supplementation currently; still with difficulty swallowing and maintaining adequate nutrition/hydration.  Afebrile, no nausea or vomiting. Respiratory system: Positive scattered rhonchi; no wheezing or crackles appreciated on exam.  Good saturation on room air.  No using accessory muscle. Cardiovascular system:RRR. No murmurs, rubs, gallops.  No JVD. Gastrointestinal system: Abdomen is nondistended, soft and nontender. No organomegaly or masses felt. Normal bowel sounds heard. Central nervous system: Alert and oriented. No new focal neurological deficits. Extremities: No cyanosis or clubbing. Skin: No petechiae. Psychiatry: Judgement and insight appear normal. Mood & affect appropriate.   Data Reviewed: I have personally reviewed following labs and imaging studies  CBC: Recent Labs  Lab 05/02/22 0705 05/03/22 0442 05/04/22 0517 05/05/22 0420  WBC 12.9* 15.9* 21.4* 21.2*  NEUTROABS 10.5* 14.0* 19.8* 19.5*  HGB 13.8 11.2* 10.9* 11.1*  HCT 43.3 34.8* 33.5* 35.0*  MCV 99.8 100.0 99.7 100.3*  PLT 147* 143* 168 [id number]   Basic Metabolic Panel: Recent Labs  Lab 05/02/22 0705 05/03/22 0442  05/04/22 0517 05/05/22 0420 05/06/22 0429  NA 139 144 141 140 143  K 3.7 3.1* 3.2* 3.5 3.1*  CL 102 114* 114* 115* 112*  CO2 26 23 21* 20* 26  GLUCOSE 180* 146* 152* 147* 115*  BUN 31* 24* 18 13 13   CREATININE 2.44* 1.62* 1.33* 1.07* 1.03*  CALCIUM 9.0 8.4* 7.9* 8.0* 8.0*  MG  --  1.9 1.7 2.0  --   PHOS  --  2.3* 2.1* 2.3* 1.2*   GFR: Estimated Creatinine Clearance: 31.2 mL/min (A) (by C-G formula based on SCr of 1.03 mg/dL (H)).  Liver Function Tests: Recent Labs  Lab 05/02/22 0705 05/03/22 0442 05/04/22 0517 05/05/22 0420 05/06/22 0429  AST 91* 160* 181* 135*  --   ALT 47* 72* 85* 78*  --   ALKPHOS 116 96 93 92  --   BILITOT 1.0 1.0 0.9 0.9  --   PROT 8.0 6.5 6.3* 6.2*  --   ALBUMIN 3.7 3.0* 2.9*  3.0* 2.9*   Recent Results (from the past 240 hour(s))  Resp Panel by RT-PCR (Flu A&B, Covid) Anterior Nasal Swab     Status: Abnormal   Collection Time: 05/02/22  7:28 AM   Specimen: Anterior Nasal Swab  Result Value Ref Range Status   SARS Coronavirus 2 by RT PCR POSITIVE (A) NEGATIVE Final    Comment: (NOTE) SARS-CoV-2 target nucleic acids are DETECTED.  The SARS-CoV-2 RNA is generally detectable in upper respiratory specimens during the acute phase of infection. Positive results are indicative of the presence of the identified virus, but do not rule out bacterial infection or co-infection with other pathogens not detected by the test. Clinical correlation with patient history and other diagnostic information is necessary to determine patient infection status. The expected result is Negative.  Fact Sheet for Patients: BloggerCourse.com  Fact Sheet for Healthcare Providers: SeriousBroker.it  This test is not yet approved or cleared by the [place] FDA and  has been authorized for detection and/or diagnosis of SARS-CoV-2 by FDA under an Emergency Use Authorization (EUA).  This EUA will remain in effect  (meaning this test can be used) for the duration of  the COVID-19 declaration under Section 564(b)(1) of the A ct, 21 U.S.C. section 360bbb-3(b)(1), unless the authorization is terminated or revoked sooner.     Influenza A by PCR NEGATIVE NEGATIVE Final   Influenza B by PCR NEGATIVE NEGATIVE Final    Comment: (NOTE) The Xpert Xpress SARS-CoV-2/FLU/RSV plus assay is intended as an aid in the diagnosis of influenza from Nasopharyngeal swab specimens and should not be used as a sole basis for treatment. Nasal washings and aspirates are unacceptable for Xpert Xpress SARS-CoV-2/FLU/RSV testing.  Fact Sheet for Patients: BloggerCourse.com  Fact Sheet for Healthcare Providers: SeriousBroker.it  This test is not yet approved or cleared by the [place] FDA and has been authorized for detection and/or diagnosis of SARS-CoV-2 by FDA under an Emergency Use Authorization (EUA). This EUA will remain in effect (meaning this test can be used) for the duration of the COVID-19 declaration under Section 564(b)(1) of the Act, 21 U.S.C. section 360bbb-3(b)(1), unless the authorization is terminated or revoked.  Performed at [facility], [address] [place], [place] [address]     Radiology Studies: No results found.   Scheduled Meds:  (feeding supplement) PROSource Plus  30 mL Oral TID BM   amLODipine  5 mg Oral Daily   vitamin C  500 mg Oral Daily   aspirin EC  81 mg Oral Daily   atorvastatin  20 mg Oral Daily   cyanocobalamin  500 mcg Oral Daily   dextromethorphan-guaiFENesin  1 tablet Oral BID   feeding supplement  1 Container Oral TID BM   folic acid  1 mg Oral Daily   heparin  5,000 Units Subcutaneous Q8H   linaclotide  72 mcg Oral Daily   megestrol  400 mg Oral BID   methylPREDNISolone (SOLU-MEDROL) injection  40 mg Intravenous Q12H   metoprolol tartrate  5 mg Intravenous Q8H   multivitamin with minerals  1 tablet Oral Daily    pantoprazole (PROTONIX) IV  40 mg Intravenous Q24H   sodium chloride flush  3 mL Intravenous Q12H   sodium chloride flush  3 mL Intravenous Q12H   zinc sulfate  220 mg Oral Daily   Continuous Infusions:  sodium chloride 10 mL/hr at 05/07/22 0454   levofloxacin (LEVAQUIN) IV Stopped (05/06/22 1721)   metronidazole 500 mg (05/07/22 0748)  LOS: 5 days    Barton Dubois M.D on 05/07/2022 at 4:19 PM  Go to [email or web address] - for contact info  [facility] - Office  [phone]  If 7PM-7AM, please contact night-coverage [email or web address] 05/07/2022, 4:19 PM

## 2022-05-07 NOTE — Progress Notes (Signed)
Patient refusing all PO medications and physical assessment.  Encouragement given to patient. Nursing staff will continue to provide support and monitor pt. ADL'S refused.Products at bedside.Attending is aware

## 2022-05-07 NOTE — Progress Notes (Signed)
Took over care of patient at 1530 today. Patient now agreeable to treatment. Orders for fluids restarted, however, pharmacy gone for the day. AC notified and cannot mix fluids of this nature with potassium. Notified Dr. Gwenlyn Perking. Requesting if [facility] can provide fluid. Have sent message to pharmacy and awaiting answer at this time. Patient given her dinner tray, but she stated she did not feel like eating at the moment. Encouraged her to eat at least the magic cup and drink some fluids. Stated she would try.

## 2022-05-08 ENCOUNTER — Telehealth (INDEPENDENT_AMBULATORY_CARE_PROVIDER_SITE_OTHER): Payer: Self-pay | Admitting: Gastroenterology

## 2022-05-08 ENCOUNTER — Encounter (HOSPITAL_COMMUNITY)
Admission: EM | Disposition: A | Discharge status: Home Health | Payer: Self-pay | Source: Home / Self Care | Attending: Family Medicine

## 2022-05-08 ENCOUNTER — Inpatient Hospital Stay (HOSPITAL_COMMUNITY): Payer: Medicare Other | Admitting: Certified Registered Nurse Anesthetist

## 2022-05-08 ENCOUNTER — Encounter (HOSPITAL_COMMUNITY): Payer: Self-pay | Admitting: Family Medicine

## 2022-05-08 DIAGNOSIS — K2289 Other specified disease of esophagus: Secondary | ICD-10-CM

## 2022-05-08 DIAGNOSIS — K222 Esophageal obstruction: Secondary | ICD-10-CM

## 2022-05-08 DIAGNOSIS — Z87891 Personal history of nicotine dependence: Secondary | ICD-10-CM

## 2022-05-08 DIAGNOSIS — R197 Diarrhea, unspecified: Secondary | ICD-10-CM | POA: Diagnosis not present

## 2022-05-08 DIAGNOSIS — I2510 Atherosclerotic heart disease of native coronary artery without angina pectoris: Secondary | ICD-10-CM

## 2022-05-08 DIAGNOSIS — K209 Esophagitis, unspecified without bleeding: Secondary | ICD-10-CM

## 2022-05-08 DIAGNOSIS — K529 Noninfective gastroenteritis and colitis, unspecified: Secondary | ICD-10-CM

## 2022-05-08 DIAGNOSIS — U071 COVID-19: Secondary | ICD-10-CM | POA: Diagnosis not present

## 2022-05-08 DIAGNOSIS — R1310 Dysphagia, unspecified: Secondary | ICD-10-CM

## 2022-05-08 DIAGNOSIS — N179 Acute kidney failure, unspecified: Secondary | ICD-10-CM | POA: Diagnosis not present

## 2022-05-08 DIAGNOSIS — R112 Nausea with vomiting, unspecified: Secondary | ICD-10-CM | POA: Diagnosis not present

## 2022-05-08 DIAGNOSIS — I10 Essential (primary) hypertension: Secondary | ICD-10-CM | POA: Diagnosis not present

## 2022-05-08 DIAGNOSIS — J9601 Acute respiratory failure with hypoxia: Secondary | ICD-10-CM | POA: Diagnosis not present

## 2022-05-08 HISTORY — PX: ESOPHAGEAL DILATION: SHX303

## 2022-05-08 HISTORY — PX: BIOPSY: SHX5522

## 2022-05-08 HISTORY — PX: ESOPHAGOGASTRODUODENOSCOPY (EGD) WITH PROPOFOL: SHX5813

## 2022-05-08 LAB — GLUCOSE, CAPILLARY
Glucose-Capillary: 144 mg/dL — ABNORMAL HIGH (ref 70–99)
Glucose-Capillary: 186 mg/dL — ABNORMAL HIGH (ref 70–99)
Glucose-Capillary: 96 mg/dL (ref 70–99)
Glucose-Capillary: 204 mg/dL — ABNORMAL HIGH (ref 70–99)

## 2022-05-08 LAB — BASIC METABOLIC PANEL
Anion gap: 5 (ref 5–15)
BUN: 13 mg/dL (ref 8–23)
CO2: 26 mmol/L (ref 22–32)
Calcium: 7.8 mg/dL — ABNORMAL LOW (ref 8.9–10.3)
Chloride: 111 mmol/L (ref 98–111)
Creatinine, Ser: 0.89 mg/dL (ref 0.44–1.00)
GFR, Estimated: >60 mL/min (ref >60)
Glucose, Bld: 163 mg/dL — ABNORMAL HIGH (ref 70–99)
Potassium: 3 mmol/L — ABNORMAL LOW (ref 3.5–5.1)
Sodium: 142 mmol/L (ref 135–145)

## 2022-05-08 SURGERY — ESOPHAGOGASTRODUODENOSCOPY (EGD) WITH PROPOFOL
Anesthesia: General

## 2022-05-08 MED ORDER — PANTOPRAZOLE SODIUM 40 MG IV SOLR
40.0000 mg | Freq: Two times a day (BID) | INTRAVENOUS | Status: DC
  Administered 2022-05-09: 40 mg via INTRAVENOUS
  Filled 2022-05-08 (×3): qty 10

## 2022-05-08 MED ORDER — SODIUM CHLORIDE 0.9 % IV SOLN: INTRAVENOUS | Status: DC

## 2022-05-08 MED ORDER — PROPOFOL 10 MG/ML IV BOLUS
INTRAVENOUS | Status: DC | PRN
  Administered 2022-05-08 (×3): 20 mg via INTRAVENOUS
  Administered 2022-05-08: 10 mg via INTRAVENOUS
  Administered 2022-05-08: 50 mg via INTRAVENOUS

## 2022-05-08 MED ORDER — POTASSIUM CHLORIDE 2 MEQ/ML IV SOLN
INTRAVENOUS | Status: AC
  Filled 2022-05-08 (×2): qty 1000

## 2022-05-08 MED ORDER — METRONIDAZOLE 500 MG/100ML IV SOLN
500.0000 mg | Freq: Two times a day (BID) | INTRAVENOUS | Status: DC
  Administered 2022-05-08 – 2022-05-09 (×2): 500 mg via INTRAVENOUS
  Filled 2022-05-08 (×3): qty 100

## 2022-05-08 MED ORDER — LEVOFLOXACIN IN D5W 500 MG/100ML IV SOLN
500.0000 mg | INTRAVENOUS | Status: DC
  Administered 2022-05-08: 500 mg via INTRAVENOUS
  Filled 2022-05-08: qty 100

## 2022-05-08 MED ORDER — LACTATED RINGERS IV SOLN: INTRAVENOUS | Status: DC | PRN

## 2022-05-08 MED ORDER — LIDOCAINE HCL (CARDIAC) PF 100 MG/5ML IV SOSY
PREFILLED_SYRINGE | INTRAVENOUS | Status: DC | PRN
  Administered 2022-05-08: 50 mg via INTRATRACHEAL

## 2022-05-08 NOTE — Progress Notes (Addendum)
Notified by NT patient was a red MEWS due to fever and increased respirations. Shortly after patient was getting [place] IV which ICU nurse said she was not cooperating and he could not get it. Took a suppository and went to re-access vitals and patient had removed blankets, (room was hot) Temp currently 98.3 at this time and respirations 22. Patient states she was dreaming previously and "they kept waking me up". Will continue to monitor and attempt for IV.

## 2022-05-08 NOTE — Brief Op Note (Signed)
05/02/2022 - 05/08/2022  3:40 PM  PATIENT:  [name]  71 y.o. female  PRE-OPERATIVE DIAGNOSIS:  dysphagia  POST-OPERATIVE DIAGNOSIS:  Esophageal Biopsies , Esophagitis, Exudate in Esophagus, Rule out candida , Distal Esophageal Stricture with Dilation  PROCEDURE:  Procedure(s) with comments: ESOPHAGOGASTRODUODENOSCOPY (EGD) WITH PROPOFOL (N/A) - with possible esophageal dilation  SURGEON:  Surgeon(s) and Role:    * Dolores Frame, MD - Primary  Patient underwent EGD under propofol sedation.  Tolerated the procedure adequately.  Diffuse, scattered white plaques were found in the upper third of the esophagus and in the middle third of the esophagus (unclear ir related to candidiasis or food debris).  Biopsies were taken with a cold forceps for histology. Two benign-appearing, intrinsic mild (non-circumferential scarring) stenoses were found in the distal esophagus.  The narrowest stenosis measured 1.2 cm (inner diameter) x less than one cm (in length).  The stenoses were traversed.  A guidewire was placed and the scope was withdrawn.  Dilation was performed with a Savary dilator with no resistance at 14 mm and mild resistance at 16 mm.  Mucosal disruption was seen upon reinspection in the upper third of the esophguas and at the level of the strictures. LA Grade A (one or more mucosal breaks less than 5 mm, not extending between tops of 2 mucosal folds) esophagitis with no bleeding was found in the lower third of the esophagus. Normal stomach and duodenum  RECOMMENDATIONS - Return patient to hospital ward for ongoing care.  - Advance diet as tolerated.  - Await pathology results, treat candidiasis with fluconazole if abnormal. - Pantoprazole 40 mg every 12 hours. - Follow up in GI clinic in 3-4 weeks. - GI service will sign-off, please call us back if you have any more questions.  Katrinka Blazing, MD Gastroenterology and Hepatology [facility]

## 2022-05-08 NOTE — Transfer of Care (Signed)
Immediate Anesthesia Transfer of Care Note  Patient: [name]  Procedure(s) Performed: ESOPHAGOGASTRODUODENOSCOPY (EGD) WITH PROPOFOL BIOPSY ESOPHAGEAL DILATION  Patient Location: PACU  Anesthesia Type:General  Level of Consciousness: awake, alert  and sedated  Airway & Oxygen Therapy: Patient Spontanous Breathing and Patient connected to nasal cannula oxygen  Post-op Assessment: Report given to RN and Post -op Vital signs reviewed and stable  Post vital signs: Reviewed and stable  Last Vitals:  Vitals Value Taken Time  BP 121/56 05/08/22 1547  Temp 98.3 05/08/22 1553  Pulse 112 05/08/22 1547  Resp 20 05/08/22 1547  SpO2 100 % 05/08/22 1547    Last Pain:  Vitals:   05/08/22 1525  TempSrc:   PainSc: 0-No pain      Patients Stated Pain Goal: 3 (05/07/22 0310)  Complications: No notable events documented.

## 2022-05-08 NOTE — Anesthesia Preprocedure Evaluation (Addendum)
Anesthesia Evaluation  Patient identified by MRN, date of birth, ID band Patient awake    Reviewed: Allergy & Precautions, NPO status , Patient's Chart, lab work & pertinent test results, reviewed documented beta blocker date and time   History of Anesthesia Complications Negative for: history of anesthetic complications  Airway Mallampati: II  TM Distance: >3 FB Neck ROM: Full    Dental  (+) Dental Advisory Given, Missing   Pulmonary neg pulmonary ROS, former smoker,    Pulmonary exam normal breath sounds clear to auscultation       Cardiovascular Exercise Tolerance: Good hypertension, Pt. on medications and Pt. on home beta blockers + CAD and + Past MI  Normal cardiovascular exam Rhythm:Regular Rate:Normal  06-May-2022 18:51:07 [facility]-ICU ROUTINE RECORD 1951-08-22 (70 yr) Female Black Test [id number] pain triage order set Vent. rate 118 BPM PR interval 88 ms QRS duration 78 ms QT/QTcB 288/403 ms P-R-T axes -83 77 -40 Sinus tachycardia with Premature atrial complexes Low voltage QRS Nonspecific T wave abnormality Abnormal ECG When compared with ECG of 04-May-2022 08:30, No significant change since last tracing PREVIOUS ECG IS PRESENT Confirmed by Carylon Perches [phone]) on 05/07/2022 6:23:36 PM   Neuro/Psych PSYCHIATRIC DISORDERS Anxiety Depression CVA    GI/Hepatic Neg liver ROS, GERD  Medicated,  Endo/Other  negative endocrine ROS  Renal/GU Renal disease  negative genitourinary   Musculoskeletal negative musculoskeletal ROS (+)   Abdominal   Peds negative pediatric ROS (+)  Hematology negative hematology ROS (+)   Anesthesia Other Findings Covid positive  Reproductive/Obstetrics negative OB ROS                           Anesthesia Physical Anesthesia Plan  ASA: 3  Anesthesia Plan: General   Post-op Pain Management: Minimal or no pain anticipated    Induction: Intravenous  PONV Risk Score and Plan: Propofol infusion  Airway Management Planned: Nasal Cannula and Natural Airway  Additional Equipment:   Intra-op Plan:   Post-operative Plan:   Informed Consent: I have reviewed the patients History and Physical, chart, labs and discussed the procedure including the risks, benefits and alternatives for the proposed anesthesia with the patient or authorized representative who has indicated his/her understanding and acceptance.     Dental advisory given  Plan Discussed with: CRNA and Surgeon  Anesthesia Plan Comments:        Anesthesia Quick Evaluation

## 2022-05-08 NOTE — Telephone Encounter (Signed)
Hi Mitzie,  Can you please schedule a follow up appointment for this patient in 3-4 weeks with [facility]? Former patient of Dr. Darrick Penna. Diagnosis: dysphagia, esophagitis and strictures.  Thanks,  Katrinka Blazing, MD Gastroenterology and Hepatology [facility]

## 2022-05-08 NOTE — Progress Notes (Signed)
SLP Cancellation Note  Patient Details Name: LENOLA LOCKNER MRN: [id number] DOB: 1951/08/31   Cancelled treatment:       Reason Eval/Treat Not Completed: Patient at procedure or test/unavailable. Pt is NPO for endoscopic evaluation therefore PO trials were not provided this am. ST will continue efforts. Thank you,  Kamri Gotsch H. Romie Levee, CCC-SLP Speech Language Pathologist    Georgetta Haber 05/08/2022, 6:42 AM

## 2022-05-08 NOTE — Progress Notes (Signed)
Pt is refusing to wear telemetry. Have given education in regards to the reason and for her safety. Have informed CMD three times since beginning of shift Will update attending who is aware of this pt noncompliance.

## 2022-05-08 NOTE — Op Note (Addendum)
[facility] Patient Name: [name] Procedure Date: 05/08/2022 3:19 PM MRN: [id number] Date of Birth: 10-May-1951 Attending MD: Maylon Peppers ,  CSN: [id number] Age: 71 Admit Type: Inpatient Procedure:                Upper GI endoscopy Indications:              Dysphagia Providers:                Maylon Peppers, Gwynneth Albright RN, RN,                            Randa Spike, Technician Referring MD:              Medicines:                Monitored Anesthesia Care Complications:            No immediate complications. Estimated Blood Loss:     Estimated blood loss: none. Procedure:                Pre-Anesthesia Assessment:                           - Prior to the procedure, a History and Physical                            was performed, and patient medications, allergies                            and sensitivities were reviewed. The patient's                            tolerance of previous anesthesia was reviewed.                           - The risks and benefits of the procedure and the                            sedation options and risks were discussed with the                            patient. All questions were answered and informed                            consent was obtained.                           After obtaining informed consent, the endoscope was                            passed under direct vision. Throughout the                            procedure, the patient's blood pressure, pulse, and                            oxygen saturations were monitored continuously. The  [id number]-H190 ([id number]) scope was introduced through the                            mouth, and advanced to the second part of duodenum.                            The upper GI endoscopy was accomplished without                            difficulty. The patient tolerated the procedure                            well. Scope In: 3:24:05 PM Scope Out:  3:36:29 PM Total Procedure Duration: 0 hours 12 minutes 24 seconds  Findings:      Diffuse, scattered white plaques were found in the upper third of the       esophagus and in the middle third of the esophagus (unclear ir related       to candidiasis or food debris). Biopsies were taken with a cold forceps       for histology.      Two benign-appearing, intrinsic mild (non-circumferential scarring)       stenoses were found in the distal esophagus. The narrowest stenosis       measured 1.2 cm (inner diameter) x less than one cm (in length). The       stenoses were traversed. A guidewire was placed and the scope was       withdrawn. Dilation was performed with a Savary dilator with no       resistance at 14 mm and mild resistance at 16 mm. Mucosal disruption was       seen upon reinspection in the upper third of the esophguas and at the       level of the strictures.      LA Grade A (one or more mucosal breaks less than 5 mm, not extending       between tops of 2 mucosal folds) esophagitis with no bleeding was found       in the lower third of the esophagus.      The stomach was normal.      The examined duodenum was normal.      Findings discussed with her son Tish Men. Impression:               - Esophageal plaques were found, suspicious for                            candidiasis. Biopsied.                           - Benign-appearing esophageal stenoses. Dilated.                           - LA Grade A esophagitis with no bleeding.                           - Normal stomach.                           - Normal  examined duodenum. Moderate Sedation:      Per Anesthesia Care Recommendation:           - Return patient to hospital ward for ongoing care.                           - Advance diet as tolerated.                           - Await pathology results, treat candidiasis with                            fluconazole if abnormal.                           - Pantoprazole 40 mg every  12 hours.                           - Follow up in GI clinic in 3-4 weeks. Procedure Code(s):        --- Professional ---                           [phone], Esophagogastroduodenoscopy, flexible,                            transoral; with insertion of guide wire followed by                            passage of dilator(s) through esophagus over guide                            wire                           43239, 59, Esophagogastroduodenoscopy, flexible,                            transoral; with biopsy, single or multiple Diagnosis Code(s):        --- Professional ---                           K22.9, Disease of esophagus, unspecified                           K22.2, Esophageal obstruction                           K20.90, Esophagitis, unspecified without bleeding                           R13.10, Dysphagia, unspecified CPT copyright 2019 [employer]. All rights reserved. The codes documented in this report are preliminary and upon coder review may  be revised to meet current compliance requirements. April Blazing, MD [name],  05/08/2022 3:56:26 PM This report has been signed electronically. Number of Addenda: 0

## 2022-05-08 NOTE — Consult Note (Signed)
Referring Provider: No ref. provider found Primary Care Physician:  Benetta Spar, MD Primary Gastroenterologist:  Dr.Carver  Date of Admission: 05/07/22 Date of Consultation: 05/08/22  Reason for Consultation:  dysphagia   HPI:  [name] is a 71 y.o. year old female with medical history of CVA, HLD, HTN, who presented to the ED on 8/12 with c/o abdominal pain, vomiting and diarrhea that had reportedly been present for weeks. Patient with ongoing complaints during admission of difficulty with liquids and solids going down. SLP evaluation with gagging and belching after swallowing thin liquids, patient refused solid trials, recommended further evaluation via EGD for dysphagia. GI consulted.   ED Course: Covid test positive CT A/P with contrast: Findings suggest severe acute colitis, predominantly involving the mid to distal transverse colon. Lipase 346, BN 31, creat 2.44, AST 91, ALT 47, WBC 12.9, Hgb 13.8 Ferritin 3516 (acute phase reactant)  Consult: Patient reports issues with swallowing solid foods. This has been ongoing for the past few months. She will have dysphagia and choke and have to cough the food back up almost every time she eats. She can tolerate liquids and soups usually. Pills tend to get stuck as well. She does endorse odynophagia as well. She has heartburn/acid regurgitation which she takes medicine for with good results.   She also endorses generalized abdominal pain. Last BM prior to admission was runny though she reports this is not normal for her. She has had less BMs recently as she has not been eating. She has lost "a lot of weight" though unable to give me an exact number, stating "it has been going on so long I cant remember all of that." She does endorse she has seen some red stools but is also drinking red fanta drinks, did not see red stools prior to drinking these. Reports everything tastes bitter since her acute illness. She also endorses some  early satiety when she was able to eat. Denies NSAIDs, no etoh, tobacco.   Last Colonoscopy:2015 Extremely redundant colon, colonic diverticulosis, inadequate prep precluded exam of all rectal and colonic mucosal surfaces. No evidence of stricture/gross colonic neoplpasm. Suspect slow colonic transit in setting of redundant colon. 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 mild gastritis  Past Medical History:  Diagnosis Date   Anxiety    Blind    right   Colitis 09/07/2011   CVA (cerebral infarction)    GERD (gastroesophageal reflux disease)    Hyperlipidemia    Hypertension    Myocardial infarct (HCC)    1997, 2004   Severe major depression with psychotic features (HCC)    2004   Stroke [facility]) 2002    Past Surgical History:  Procedure Laterality Date   ABDOMINAL HYSTERECTOMY     BILATERAL SALPINGOOPHORECTOMY     CHOLECYSTECTOMY N/A 10/15/2021   Procedure: LAPAROSCOPIC CHOLECYSTECTOMY WITH INTRAOPERATIVE CHOLANGIOGRAM;  Surgeon: Franky Macho, MD;  Location: [facility];  Service: General;  Laterality: N/A;   COLONOSCOPY N/A 07/17/2014   RMR: Extremely redundant colon. Colonic diverticulosis. Inadequate prepartation precluded examination of all the rectal and colonic.  I suspect slow colonic transit in the setting of a markely redundant colon.   CORONARY ANGIOPLASTY WITH STENT PLACEMENT  [id number]   cyst removed from left wrist     ERCP N/A 10/10/2021   Procedure: ENDOSCOPIC RETROGRADE CHOLANGIOPANCREATOGRAPHY (ERCP);  Surgeon: Malissa Hippo, MD;  Location: [facility];  Service: Gastroenterology;  Laterality: N/A;   ERCP N/A 10/13/2021   Procedure: ENDOSCOPIC RETROGRADE  CHOLANGIOPANCREATOGRAPHY (ERCP);  Surgeon: Rogene Houston, MD;  Location: [facility];  Service: Gastroenterology;  Laterality: N/A;   ESOPHAGOGASTRODUODENOSCOPY  09/05/2004   Dr Chucky May gastritis, candida esophagitis   ESOPHAGOGASTRODUODENOSCOPY  09/07/2011   [id number], esophageal in the distal  esophagus/Mild gastritis   Lumps removed from each breast     SPHINCTEROTOMY N/A 10/10/2021   Procedure: SPHINCTEROTOMY INCOMPLETE, STONE NOT REMOVED;  Surgeon: Rogene Houston, MD;  Location: [facility];  Service: Gastroenterology;  Laterality: N/A;    Prior to Admission medications   Medication Sig Start Date End Date Taking? Authorizing Provider  albuterol (VENTOLIN HFA) 108 (90 Base) MCG/ACT inhaler Inhale into the lungs.   Yes [provider]  amLODipine (NORVASC) 10 MG tablet Take 10 mg by mouth daily. 08/10/21  Yes [provider]  aspirin EC 81 MG tablet Take 81 mg by mouth daily.     Yes [provider]  citalopram (CELEXA) 20 MG tablet Take 20 mg by mouth daily. 03/31/22  Yes [provider]  cloNIDine (CATAPRES) 0.3 MG tablet Take 0.3 mg by mouth 2 (two) times daily. 08/12/21  Yes [provider]  dextromethorphan-guaiFENesin (MUCINEX DM) 30-600 MG per 12 hr tablet Take 1 tablet by mouth 2 (two) times daily.   Yes [provider]  docusate sodium (COLACE) 100 MG capsule Take 100 mg by mouth daily.     Yes [provider]  folic acid (FOLVITE) 1 MG tablet Take 1 tablet (1 mg total) by mouth daily. 10/17/21  Yes Tat, Shanon Brow, MD  furosemide (LASIX) 20 MG tablet Take 20 mg by mouth daily.   Yes [provider]  loratadine (CLARITIN) 10 MG tablet Take 10 mg by mouth daily. 03/12/22  Yes [provider]  metoprolol tartrate (LOPRESSOR) 50 MG tablet Take 50 mg by mouth 2 (two) times daily. 08/10/21  Yes [provider]  mirtazapine (REMERON) 7.5 MG tablet Take 7.5 mg by mouth at bedtime. 04/27/22  Yes [provider]  omeprazole (PRILOSEC) 40 MG capsule Take 40 mg by mouth daily. 10/20/19  Yes [provider]  rosuvastatin (CRESTOR) 5 MG tablet Take by mouth.   Yes [provider]  vitamin B-12 (CYANOCOBALAMIN) 500 MCG tablet Take 1 tablet (500 mcg total) by mouth daily. 10/16/21  Yes  Tat, Shanon Brow, MD  HYDROcodone-acetaminophen (NORCO/VICODIN) 5-325 MG tablet Take 1-2 tablets by mouth every 4 (four) hours as needed for moderate pain. Patient not taking: Reported on 05/03/2022 10/16/21   Orson Eva, MD    Current Facility-Administered Medications  Medication Dose Route Frequency Provider Last Rate Last Admin   (feeding supplement) PROSource Plus liquid 30 mL  30 mL Oral TID BM Emokpae, Courage, MD       0.9 %  sodium chloride infusion   Intravenous PRN Roxan Hockey, MD 10 mL/hr at 05/07/22 0454 Infusion Verify at 05/07/22 0454   acetaminophen (TYLENOL) tablet 650 mg  650 mg Oral Q6H PRN Roxan Hockey, MD       Or   acetaminophen (TYLENOL) suppository 650 mg  650 mg Rectal Q6H PRN Emokpae, Courage, MD       albuterol (VENTOLIN HFA) 108 (90 Base) MCG/ACT inhaler 2 puff  2 puff Inhalation Q4H PRN Emokpae, Courage, MD       amLODipine (NORVASC) tablet 5 mg  5 mg Oral Daily Emokpae, Courage, MD       ascorbic acid (VITAMIN C) tablet 500 mg  500 mg Oral Daily Emokpae, Courage, MD   500  mg at 05/04/22 1003   aspirin EC tablet 81 mg  81 mg Oral Daily Emokpae, Courage, MD   81 mg at 05/04/22 1003   atorvastatin (LIPITOR) tablet 20 mg  20 mg Oral Daily Emokpae, Courage, MD   20 mg at 05/04/22 1003   chlorpheniramine-HYDROcodone 10-8 MG/5ML suspension 5 mL  5 mL Oral Q12H PRN Emokpae, Courage, MD       cyanocobalamin (VITAMIN B12) tablet 500 mcg  500 mcg Oral Daily Emokpae, Courage, MD   500 mcg at 05/04/22 1003   dextromethorphan-guaiFENesin (MUCINEX DM) 30-600 MG per 12 hr tablet 1 tablet  1 tablet Oral BID Emokpae, Courage, MD   1 tablet at 05/04/22 1002   dextrose 10 % and 0.45 % NaCl 1,000 mL with potassium chloride 40 mEq infusion   Intravenous Continuous Vassie Loll, MD 75 mL/hr at 05/07/22 1754 New Bag at 05/07/22 1754   diphenhydrAMINE (BENADRYL) tablet 25 mg  25 mg Oral Q8H PRN Emokpae, Courage, MD       feeding supplement (BOOST / RESOURCE BREEZE) liquid 1 Container  1  Container Oral TID BM Mariea Clonts, Courage, MD   1 Container at 05/04/22 1620   folic acid (FOLVITE) tablet 1 mg  1 mg Oral Daily Emokpae, Courage, MD   1 mg at 05/04/22 1003   guaiFENesin-dextromethorphan (ROBITUSSIN DM) 100-10 MG/5ML syrup 10 mL  10 mL Oral Q4H PRN Emokpae, Courage, MD       heparin injection 5,000 Units  5,000 Units Subcutaneous Q8H Emokpae, Courage, MD   5,000 Units at 05/08/22 0516   hydrALAZINE (APRESOLINE) injection 10 mg  10 mg Intravenous Q6H PRN Emokpae, Courage, MD       HYDROcodone-acetaminophen (NORCO/VICODIN) 5-325 MG per tablet 1-2 tablet  1-2 tablet Oral Q4H PRN Mariea Clonts, Courage, MD   1 tablet at 05/07/22 0304   insulin aspart (novoLOG) injection 0-5 Units  0-5 Units Subcutaneous QHS Vassie Loll, MD       insulin aspart (novoLOG) injection 0-9 Units  0-9 Units Subcutaneous TID WC Vassie Loll, MD   1 Units at 05/08/22 0804   Ipratropium-Albuterol (COMBIVENT) respimat 1 puff  1 puff Inhalation Q4H PRN Vassie Loll, MD       levofloxacin (LEVAQUIN) IVPB 500 mg  500 mg Intravenous Q24H Vassie Loll, MD 100 mL/hr at 05/08/22 0803 500 mg at 05/08/22 0803   linaclotide (LINZESS) capsule 72 mcg  72 mcg Oral Daily Emokpae, Courage, MD   72 mcg at 05/04/22 1014   megestrol (MEGACE) 400 MG/10ML suspension 400 mg  400 mg Oral BID Emokpae, Courage, MD       methylPREDNISolone sodium succinate (SOLU-MEDROL) 40 mg/mL injection 40 mg  40 mg Intravenous Q12H Vassie Loll, MD   40 mg at 05/08/22 0802   metoprolol tartrate (LOPRESSOR) injection 5 mg  5 mg Intravenous Q8H Vassie Loll, MD   5 mg at 05/08/22 0516   metroNIDAZOLE (FLAGYL) IVPB 500 mg  500 mg Intravenous Q12H Vassie Loll, MD       multivitamin with minerals tablet 1 tablet  1 tablet Oral Daily Emokpae, Courage, MD   1 tablet at 05/04/22 1004   ondansetron (ZOFRAN) tablet 4 mg  4 mg Oral Q6H PRN Emokpae, Courage, MD       Or   ondansetron (ZOFRAN) injection 4 mg  4 mg Intravenous Q6H PRN Emokpae, Courage, MD        pantoprazole (PROTONIX) injection 40 mg  40 mg Intravenous Q24H Vassie Loll, MD   40  mg at 05/07/22 2235   sodium chloride flush (NS) 0.9 % injection 3 mL  3 mL Intravenous Q12H Emokpae, Courage, MD   3 mL at 05/07/22 2241   sodium chloride flush (NS) 0.9 % injection 3 mL  3 mL Intravenous Q12H Emokpae, Courage, MD   3 mL at 05/07/22 2241   sodium chloride flush (NS) 0.9 % injection 3 mL  3 mL Intravenous PRN Emokpae, Courage, MD       traZODone (DESYREL) tablet 50 mg  50 mg Oral QHS PRN Emokpae, Courage, MD       zinc sulfate capsule 220 mg  220 mg Oral Daily Emokpae, Courage, MD   220 mg at 05/04/22 1014    Allergies as of 05/02/2022 - Review Complete 05/02/2022  Allergen Reaction Noted   Penicillins Anaphylaxis 09/06/2011   Aspirin Swelling and Other (See Comments) 09/07/2011   Bee venom Swelling 09/07/2011   Contrast media [iodinated contrast media] Swelling 09/07/2011   Dilaudid [hydromorphone hcl] Itching 09/07/2011   Lisinopril Swelling 09/07/2011   Motrin [ibuprofen] Swelling 09/07/2011    Family History  Problem Relation Age of Onset   Multiple sclerosis Father    Alzheimer's disease Mother    Hypertension Mother    Diabetes Mother    Colon cancer Neg Hx     Social History   Socioeconomic History   Marital status: Widowed    Spouse name: Not on file   Number of children: 3   Years of education: Not on file   Highest education level: Not on file  Occupational History   Occupation: disabled, Best boy  Tobacco Use   Smoking status: Former    Packs/day: 0.25    Years: 40.00    Total pack years: 10.00    Types: Cigarettes    Quit date: 08/02/2017    Years since quitting: 4.7   Smokeless tobacco: Never  Vaping Use   Vaping Use: Never used  Substance and Sexual Activity   Alcohol use: No    Alcohol/week: 0.0 standard drinks of alcohol   Drug use: Yes    Types: Marijuana   Sexual activity: Not Currently    Birth control/protection: Post-menopausal   Other Topics Concern   Not on file  Social History Narrative   Lives alone   Social Determinants of Health   Financial Resource Strain: Not on file  Food Insecurity: Not on file  Transportation Needs: Not on file  Physical Activity: Not on file  Stress: Not on file  Social Connections: Not on file  Intimate Partner Violence: Not on file   Review of Systems: Gen: Denies fever, chills, loss of appetite, change in weight or weight loss CV: Denies chest pain, heart palpitations, syncope, edema  Resp: Denies shortness of breath with rest, cough, wheezing GI: denies melena, hematochezia, nausea, vomiting, constipation.+nausea +vomiting +dysphagia +odynophagia +weight loss +early satiety +diarrhea GU : Denies urinary burning, urinary frequency, urinary incontinence.  MS: Denies joint pain,swelling, cramping Derm: Denies rash, itching, dry skin Psych: Denies depression, anxiety,confusion, or memory loss Heme: Denies bruising, bleeding, and enlarged lymph nodes.  Physical Exam: Vital signs in last 24 hours: Temp:  [98.3 F (36.8 C)-98.9 F (37.2 C)] 98.9 F (37.2 C) (08/18 0425) Pulse Rate:  [85-106] 85 (08/18 0425) Resp:  [18-20] 20 (08/18 0425) BP: (121-135)/(60-73) 135/60 (08/18 0425) SpO2:  [98 %-100 %] 98 % (08/18 0425) Last BM Date : 05/01/22 General:   Alert, cachectic Head:  Normocephalic and atraumatic. Eyes:  Sclera clear, no  icterus.   Conjunctiva pink. Ears:  Normal auditory acuity. Nose:  No deformity, discharge,  or lesions. Mouth:  No deformity or lesions, dentition normal. Lungs:  scattered rhonchi. No acute distress Heart:  Regular rate and rhythm; no murmurs, clicks, rubs,  or gallops. Abdomen:  Soft, and nondistended. TTP of upper abdomen. No masses, hepatosplenomegaly or hernias noted. Normal bowel sounds, without guarding, and without rebound.   Rectal:  Deferred until time of colonoscopy.   Msk:  Symmetrical without gross deformities. Normal  posture. Pulses:  Normal pulses noted. Extremities:  Without clubbing or edema. Neurologic:  Alert and  oriented x4;  grossly normal neurologically. Skin:  Intact without significant lesions or rashes. Psych:  Alert and cooperative. Normal mood and affect.  Intake/Output from previous day: 08/17 0701 - 08/18 0700 In: 880.9 [P.O.:637; I.V.:28.7; IV Piggyback:215.3] Out: -  Intake/Output this shift: No intake/output data recorded.  Lab Results: No results for input(s): "WBC", "HGB", "HCT", "PLT" in the last 72 hours. BMET Recent Labs    05/06/22 0429 05/08/22 0405  NA 143 142  K 3.1* 3.0*  CL 112* 111  CO2 26 26  GLUCOSE 115* 163*  BUN 13 13  CREATININE 1.03* 0.89  CALCIUM 8.0* 7.8*   LFT Recent Labs    05/06/22 0429  ALBUMIN 2.9*    Impression: [name] is a 71 y.o. year old female with medical history of CVA, HLD, HTN, who presented to the ED on 8/12 with c/o abdominal pain, vomiting and diarrhea that had reportedly been present for weeks. Patient with ongoing complaints during admission of difficulty with liquids and solids going down. SLP evaluation with gagging and belching after swallowing thin liquids, patient refused solid trials, recommended further evaluation via EGD for dysphagia. GI consulted.   Dysphagia: ongoing for "some time" per patient, she thinks months. History of GERD though well managed on outpatient basis, per patient. Also endorses some early satiety with nausea and vomiting at times. Denies NSAID use or etoh. Dysphagia occurs mostly with solid foods and she often has to regurgitate them back up as they will not pass. Last EGD in 2012 with esophageal ring in distal esophagus as well as gastritis. Recommend proceeding with EGD for further evaluation as we cannot rule out recurrent esophageal ring, or web, stricture, stenosis, or malignancy, especially given weight loss. Indications, risks and benefits of procedure discussed in detail with patient.  Patient verbalized understanding and is in agreement to proceed with EGD at this time.   Abnormal CT A/P of colon: CT with severe acute colitis, predominantly involving the mid to distal transverse colon. constipation at baseline, reports runny stools just prior to admission, though no BMs since being here. Had some red stools but states this started after consuming red fanta soda. Denies melena. No fevers or chills though notably WBC 21.2 today. Consider checking stool studies if diarrhea recurs during admission. She would benefit from follow up colonoscopy on outpatient basis, especially considering last was in 2015 with poor prep. Further discussion of this can be done at outpatient hospital follow up with GI   Plan: PPI daily Remain NPO EGD +/- dilation today Stool studies if diarrhea persists Consider outpatient colonoscopy with 2 day prep   LOS: 6 days    05/08/2022, 9:11 AM   Mally Gavina L. Alver Sorrow, MSN, APRN, AGNP-C Adult-Gerontology Nurse Practitioner [facility]

## 2022-05-08 NOTE — Progress Notes (Signed)
PROGRESS NOTE     [name], is a 71 y.o. female, DOB - 1950/11/01, QB:[id number]  Admit date - 05/02/2022   Admitting Physician Courage Denton Brick, MD  Outpatient Primary MD for the patient is [name], [name], MD  LOS - 6  Chief Complaint  Patient presents with   Emesis        Brief Narrative:  71 y.o. female with PMH of CVA, HLD, HTN , poor visual acuity, history of CAD/prior MI, GERD and depressive disorder admitted with acute hypoxic respiratory failure secondary to COVID-19 infection, as well as concerns for colitis, poor oral intake/dehydration and AKI   -Assessment and Plan:  1)Acute hypoxic respiratory failure secondary to COVID-19 infection--- The treatment plan and use of medications  for treatment of COVID-19 infection and possible side effects were discussed with patient and her son [name] -Hypoxia resolved. -Patient is positive for COVID-19 infection, no longer requiring oxygen supplementation.  Has completed treatment with multiple BW -Continue steroids management. --Attempt to maintain euvolemic state --Continue zinc and vitamin C as ordered -Albuterol/Combivent inhaler as needed -Continue to follow inflammatory markers as needed. -Afebrile and overall improved.   2)Acute Colitis--- -abnormal findings appreciated on CT abdomen and pelvis without contrast--- (images suggesting severe acute colitis predominantly involving the mid to distal transverse colon) -Patient presented with leukocytosis, abdominal pain and vomiting and diarrhea -Continue Levaquin and Flagyl for total of 8 days (pt has hx of penicillin allergy)--- -???  COVID-related colitis versus bacterial colitis -Leukocytosis persist suspect partly due to use of steroids -No further BMs (patient is not really eating much) -Continue supportive care and maintain adequate hydration..   3)H/o CAD/Prior MI--- no ACS type symptoms at this time --Troponin 34 , repeat 30 -EKG sinus rhythm with  PVCs -Continue metoprolol, aspirin and  Crestor (when able to tolerate p.o.). -Chest pain.   4)AKI----acute kidney injury--suspect due to dehydration in the setting of diarrhea  --Creatinine is up to 2.44 from a baseline of between 0.7-0.8, -Creatinine improved and back to normal currently. -Continue to follow renal function trend -Continue to minimize nephrotoxic agents and the use of contrast. -Okay to discontinue IV fluids and encourage oral intake -Vital signs stable.   5)HTN---  -Continue the use of amlodipine and metoprolol -Continue to follow vital signs and further adjust antihypertensive regimen as needed. -Currently limited options to control blood pressure given inability to swallow meds.   6)H/o CVA---  -no strokelike symptoms at this time  -Continue aspirin and Lipitor for secondary prevention.  -Continue risk factors modification.   7)COPD/Emphysema----no acute exacerbation at this time -Hypoxia has resolved -Continue bronchodilators and steroids   8) elevated lipase---346 -CT abdomen and pelvis without abnormalities of the pancreas -Clinically presentation not consistent with acute pancreatitis; and most likely associated with viral infection in the setting of acute phase reactant. -Patient denies nausea and vomiting.   9) anemia and thrombocytopenia--- anemia appears not to be new...  Thrombocytopenia appears to be new, and most likely associated with viral infection. -Continue to monitor closely hemoglobin and platelet count. -No overt bleeding appreciated.  10) hypophosphatemia/hypokalemia---  -Continue to replete as needed; follow electrolytes trend.  11)Dysphagia----patient with history of prior stroke, swallowing difficulties making it difficult for patient to have significant/adequate oral intake -Encourage liquids -Speech pathology evaluation appreciated; will follow rec's -Dysphagia 2 (fine chop);Thin liquid -Liquids provided via:  Cup;Straw Medication Administration: Crushed with puree -Follow GI service recommendations; plan is for endoscopy later today. -Continue PPI.   12) anorexia/adult FTT ----- -  continue the use of Megace for appetite stimulation -Patient has been encouraged to increase oral intake. -Mainly driven by difficulty swallowing. -GI service has been consulted to assist with further evaluation of her dysphagia; plan is for endoscopy later today.  Will follow recommendation. -Continue PPI.  Disposition/Need for in-Hospital Stay- patient unable to be discharged at this time due to -COVID infection and colitis with-Swallowing difficulties making it difficult for patient to have significant/adequate oral intake.  -Anticipate discharge home once oral intake improves -Hypoxia has resolved -Dehydration resolving -Still requiring electrolyte repletion -Gastroenterology has been consulted for possible endoscopic evaluation given ongoing dysphagia with a sensation of food getting stuck in her chest.   Status is: Inpatient   Remains inpatient appropriate because:    Dispo: The patient is from: Home              Anticipated d/c is to: Home              Anticipated d/c date is: 1-2 days              Patient currently is not medically stable to d/c.  Barriers: Not Clinically Stable-   Family Communication:    (patient is alert, awake and coherent)  -Discussed with patient's son [name]  DVT Prophylaxis  :   - SCDs   heparin injection 5,000 Units Start: 05/02/22 1415 SCDs Start: 05/02/22 1318 Place TED hose Start: 05/02/22 1318   Lab Results  Component Value Date   PLT 182 05/05/2022   Inpatient Medications  Scheduled Meds:  (feeding supplement) PROSource Plus  30 mL Oral TID BM   amLODipine  5 mg Oral Daily   vitamin C  500 mg Oral Daily   aspirin EC  81 mg Oral Daily   atorvastatin  20 mg Oral Daily   cyanocobalamin  500 mcg Oral Daily   dextromethorphan-guaiFENesin  1 tablet Oral BID    feeding supplement  1 Container Oral TID BM   folic acid  1 mg Oral Daily   heparin  5,000 Units Subcutaneous Q8H   insulin aspart  0-5 Units Subcutaneous QHS   insulin aspart  0-9 Units Subcutaneous TID WC   linaclotide  72 mcg Oral Daily   megestrol  400 mg Oral BID   methylPREDNISolone (SOLU-MEDROL) injection  40 mg Intravenous Q12H   metoprolol tartrate  5 mg Intravenous Q8H   multivitamin with minerals  1 tablet Oral Daily   pantoprazole (PROTONIX) IV  40 mg Intravenous Q12H   sodium chloride flush  3 mL Intravenous Q12H   sodium chloride flush  3 mL Intravenous Q12H   zinc sulfate  220 mg Oral Daily   Continuous Infusions:  sodium chloride 10 mL/hr at 05/07/22 0454   levofloxacin (LEVAQUIN) IV Stopped (05/08/22 0904)   metronidazole Stopped (05/08/22 1020)   PRN Meds:.sodium chloride, acetaminophen **OR** acetaminophen, albuterol, chlorpheniramine-HYDROcodone, diphenhydrAMINE, guaiFENesin-dextromethorphan, hydrALAZINE, HYDROcodone-acetaminophen, Ipratropium-Albuterol, ondansetron **OR** ondansetron (ZOFRAN) IV, sodium chloride flush, traZODone   Anti-infectives (From admission, onward)    Start     Dose/Rate Route Frequency Ordered Stop   05/08/22 0915  metroNIDAZOLE (FLAGYL) IVPB 500 mg        500 mg 100 mL/hr over 60 Minutes Intravenous Every 12 hours 05/08/22 0753 05/10/22 0914   05/08/22 0900  levofloxacin (LEVAQUIN) IVPB 500 mg        500 mg 100 mL/hr over 60 Minutes Intravenous Every 24 hours 05/08/22 0753 05/10/22 0859   05/04/22 0915  levofloxacin (LEVAQUIN) IVPB 500 mg  Status:  Discontinued        500 mg 100 mL/hr over 60 Minutes Intravenous Every 48 hours 05/03/22 0917 05/08/22 0753   05/02/22 1400  molnupiravir EUA (LAGEVRIO) capsule 800 mg        4 capsule Oral 2 times daily 05/02/22 1308 05/07/22 0959   05/02/22 1000  nirmatrelvir/ritonavir EUA (PAXLOVID) 3 tablet  Status:  Discontinued        3 tablet Oral 2 times daily 05/02/22 0914 05/02/22 0919    05/02/22 1000  nirmatrelvir/ritonavir EUA (renal dosing) (PAXLOVID) 2 tablet  Status:  Discontinued        2 tablet Oral 2 times daily 05/02/22 0919 05/02/22 1308   05/02/22 0915  levofloxacin (LEVAQUIN) IVPB 750 mg  Status:  Discontinued        750 mg 100 mL/hr over 90 Minutes Intravenous Every 48 hours 05/02/22 0914 05/03/22 0917   05/02/22 0915  metroNIDAZOLE (FLAGYL) IVPB 500 mg  Status:  Discontinued        500 mg 100 mL/hr over 60 Minutes Intravenous Every 12 hours 05/02/22 0914 05/08/22 0753       Subjective: [name] no fever, no chest pain, no nausea, no vomiting.  Reports significant improvement in her breathing and feeling significant improvement in her breathing and feeling more hungry today.  Still having some difficulty swallowing..  In appearance patient is underweight, chronically ill and frail.  Objective: Vitals:   05/07/22 2114 05/08/22 0425 05/08/22 1547 05/08/22 1553  BP: 128/73 135/60 (!) 121/56 (!) 148/72  Pulse: 90 85 (!) 112 100  Resp: 18 20 (!) 24 20  Temp: 98.3 F (36.8 C) 98.9 F (37.2 C) 98.3 F (36.8 C)   TempSrc:      SpO2: 100% 98% 100% 97%  Weight:      Height:        Intake/Output Summary (Last 24 hours) at 05/08/2022 1613 Last data filed at 05/08/2022 1552 Gross per 24 hour  Intake 1865.4 ml  Output --  Net 1865.4 ml   Filed Weights   05/02/22 0628  Weight: 40 kg    Physical Exam General exam: Alert, awake, oriented x 3; no requiring oxygen supplementation, expressing feeling slightly better and with some increasing her appetite.  Still having some difficulty swallowing.  Pending endoscopic evaluation later today. Respiratory system: Good air movement bilaterally; no using accessory muscles.  Good saturation on room air. Cardiovascular system:RRR. No rubs or gallops; no JVD. Gastrointestinal system: Abdomen is nondistended, soft and nontender. No organomegaly or masses felt. Normal bowel sounds heard. Central nervous system: Alert  and oriented. No new focal neurological deficits. Extremities: No masses or clubbing. Skin: No petechiae. Psychiatry: Judgement and insight appear normal. Mood & affect appropriate.    Data Reviewed: I have personally reviewed following labs and imaging studies  CBC: Recent Labs  Lab 05/02/22 0705 05/03/22 0442 05/04/22 0517 05/05/22 0420  WBC 12.9* 15.9* 21.4* 21.2*  NEUTROABS 10.5* 14.0* 19.8* 19.5*  HGB 13.8 11.2* 10.9* 11.1*  HCT 43.3 34.8* 33.5* 35.0*  MCV 99.8 100.0 99.7 100.3*  PLT 147* 143* 168 [id number]   Basic Metabolic Panel: Recent Labs  Lab 05/03/22 0442 05/04/22 0517 05/05/22 0420 05/06/22 0429 05/08/22 0405  NA 144 141 140 143 142  K 3.1* 3.2* 3.5 3.1* 3.0*  CL 114* 114* 115* 112* 111  CO2 23 21* 20* 26 26  GLUCOSE 146* 152* 147* 115* 163*  BUN 24* 18 13 13 13   CREATININE 1.62* 1.33* 1.07*  1.03* 0.89  CALCIUM 8.4* 7.9* 8.0* 8.0* 7.8*  MG 1.9 1.7 2.0  --   --   PHOS 2.3* 2.1* 2.3* 1.2*  --    GFR: Estimated Creatinine Clearance: 36.1 mL/min (by C-G formula based on SCr of 0.89 mg/dL).  Liver Function Tests: Recent Labs  Lab 05/02/22 0705 05/03/22 0442 05/04/22 0517 05/05/22 0420 05/06/22 0429  AST 91* 160* 181* 135*  --   ALT 47* 72* 85* 78*  --   ALKPHOS 116 96 93 92  --   BILITOT 1.0 1.0 0.9 0.9  --   PROT 8.0 6.5 6.3* 6.2*  --   ALBUMIN 3.7 3.0* 2.9* 3.0* 2.9*   Recent Results (from the past 240 hour(s))  Resp Panel by RT-PCR (Flu A&B, Covid) Anterior Nasal Swab     Status: Abnormal   Collection Time: 05/02/22  7:28 AM   Specimen: Anterior Nasal Swab  Result Value Ref Range Status   SARS Coronavirus 2 by RT PCR POSITIVE (A) NEGATIVE Final    Comment: (NOTE) SARS-CoV-2 target nucleic acids are DETECTED.  The SARS-CoV-2 RNA is generally detectable in upper respiratory specimens during the acute phase of infection. Positive results are indicative of the presence of the identified virus, but do not rule out bacterial infection or  co-infection with other pathogens not detected by the test. Clinical correlation with patient history and other diagnostic information is necessary to determine patient infection status. The expected result is Negative.  Fact Sheet for Patients: BloggerCourse.com  Fact Sheet for Healthcare Providers: SeriousBroker.it  This test is not yet approved or cleared by the [place] FDA and  has been authorized for detection and/or diagnosis of SARS-CoV-2 by FDA under an Emergency Use Authorization (EUA).  This EUA will remain in effect (meaning this test can be used) for the duration of  the COVID-19 declaration under Section 564(b)(1) of the A ct, 21 U.S.C. section 360bbb-3(b)(1), unless the authorization is terminated or revoked sooner.     Influenza A by PCR NEGATIVE NEGATIVE Final   Influenza B by PCR NEGATIVE NEGATIVE Final    Comment: (NOTE) The Xpert Xpress SARS-CoV-2/FLU/RSV plus assay is intended as an aid in the diagnosis of influenza from Nasopharyngeal swab specimens and should not be used as a sole basis for treatment. Nasal washings and aspirates are unacceptable for Xpert Xpress SARS-CoV-2/FLU/RSV testing.  Fact Sheet for Patients: BloggerCourse.com  Fact Sheet for Healthcare Providers: SeriousBroker.it  This test is not yet approved or cleared by the [place] FDA and has been authorized for detection and/or diagnosis of SARS-CoV-2 by FDA under an Emergency Use Authorization (EUA). This EUA will remain in effect (meaning this test can be used) for the duration of the COVID-19 declaration under Section 564(b)(1) of the Act, 21 U.S.C. section 360bbb-3(b)(1), unless the authorization is terminated or revoked.  Performed at [facility], [address] [place], [place] [address]     Radiology Studies: No results found.   Scheduled Meds:  (feeding  supplement) PROSource Plus  30 mL Oral TID BM   amLODipine  5 mg Oral Daily   vitamin C  500 mg Oral Daily   aspirin EC  81 mg Oral Daily   atorvastatin  20 mg Oral Daily   cyanocobalamin  500 mcg Oral Daily   dextromethorphan-guaiFENesin  1 tablet Oral BID   feeding supplement  1 Container Oral TID BM   folic acid  1 mg Oral Daily   heparin  5,000 Units Subcutaneous Q8H  insulin aspart  0-5 Units Subcutaneous QHS   insulin aspart  0-9 Units Subcutaneous TID WC   linaclotide  72 mcg Oral Daily   megestrol  400 mg Oral BID   methylPREDNISolone (SOLU-MEDROL) injection  40 mg Intravenous Q12H   metoprolol tartrate  5 mg Intravenous Q8H   multivitamin with minerals  1 tablet Oral Daily   pantoprazole (PROTONIX) IV  40 mg Intravenous Q12H   sodium chloride flush  3 mL Intravenous Q12H   sodium chloride flush  3 mL Intravenous Q12H   zinc sulfate  220 mg Oral Daily   Continuous Infusions:  sodium chloride 10 mL/hr at 05/07/22 0454   levofloxacin (LEVAQUIN) IV Stopped (05/08/22 0272)   metronidazole Stopped (05/08/22 1020)     LOS: 6 days    Vassie Loll M.D on 05/08/2022 at 4:13 PM  Go to [email or web address] - for contact info  [facility] - Office  [phone]  If 7PM-7AM, please contact night-coverage [email or web address] 05/08/2022, 4:13 PM

## 2022-05-08 NOTE — Anesthesia Postprocedure Evaluation (Signed)
Anesthesia Post Note  Patient: [name]  Procedure(s) Performed: ESOPHAGOGASTRODUODENOSCOPY (EGD) WITH PROPOFOL BIOPSY ESOPHAGEAL DILATION  Patient location during evaluation: PACU Anesthesia Type: General Level of consciousness: awake and alert and oriented Pain management: pain level controlled Vital Signs Assessment: post-procedure vital signs reviewed and stable Respiratory status: spontaneous breathing, nonlabored ventilation and respiratory function stable Cardiovascular status: blood pressure returned to baseline and stable Postop Assessment: no apparent nausea or vomiting Anesthetic complications: no   No notable events documented.   Last Vitals:  Vitals:   05/08/22 1547 05/08/22 1553  BP: (!) 121/56 (!) 148/72  Pulse: (!) 112 100  Resp: (!) 24 20  Temp: 36.8 C   SpO2: 100% 97%    Last Pain:  Vitals:   05/08/22 1547  TempSrc:   PainSc: 0-No pain                 Laurelin Elson C Kanen Mottola

## 2022-05-09 DIAGNOSIS — R1319 Other dysphagia: Secondary | ICD-10-CM | POA: Diagnosis not present

## 2022-05-09 DIAGNOSIS — N179 Acute kidney failure, unspecified: Secondary | ICD-10-CM | POA: Diagnosis not present

## 2022-05-09 DIAGNOSIS — U071 COVID-19: Secondary | ICD-10-CM | POA: Diagnosis not present

## 2022-05-09 DIAGNOSIS — K219 Gastro-esophageal reflux disease without esophagitis: Secondary | ICD-10-CM

## 2022-05-09 DIAGNOSIS — J9601 Acute respiratory failure with hypoxia: Secondary | ICD-10-CM | POA: Diagnosis not present

## 2022-05-09 DIAGNOSIS — L89151 Pressure ulcer of sacral region, stage 1: Secondary | ICD-10-CM

## 2022-05-09 LAB — GLUCOSE, CAPILLARY
Glucose-Capillary: 120 mg/dL — ABNORMAL HIGH (ref 70–99)
Glucose-Capillary: 118 mg/dL — ABNORMAL HIGH (ref 70–99)

## 2022-05-09 MED ORDER — CLONIDINE HCL 0.1 MG PO TABS: 0.3000 mg | ORAL_TABLET | Freq: Two times a day (BID) | ORAL | 2 refills | Status: DC

## 2022-05-09 MED ORDER — ATORVASTATIN CALCIUM 20 MG PO TABS: 20.0000 mg | ORAL_TABLET | Freq: Every day | ORAL | 1 refills | Status: DC

## 2022-05-09 MED ORDER — PANTOPRAZOLE SODIUM 40 MG PO TBEC: 40.0000 mg | DELAYED_RELEASE_TABLET | Freq: Two times a day (BID) | ORAL | 1 refills | Status: DC

## 2022-05-09 MED ORDER — ACETAMINOPHEN 325 MG PO TABS: 650.0000 mg | ORAL_TABLET | Freq: Four times a day (QID) | ORAL | Status: DC | PRN

## 2022-05-09 MED ORDER — PREDNISONE 10 MG PO TABS: ORAL_TABLET | ORAL | 0 refills | Status: DC

## 2022-05-09 MED ORDER — ZINC SULFATE 220 (50 ZN) MG PO CAPS: 220.0000 mg | ORAL_CAPSULE | Freq: Every day | ORAL | 1 refills | Status: DC

## 2022-05-09 MED ORDER — MEGESTROL ACETATE 400 MG/10ML PO SUSP: 400.0000 mg | Freq: Two times a day (BID) | ORAL | 1 refills | Status: DC

## 2022-05-09 MED ORDER — ASCORBIC ACID 500 MG PO TABS: 500.0000 mg | ORAL_TABLET | Freq: Every day | ORAL | 1 refills | Status: DC

## 2022-05-09 MED ORDER — IPRATROPIUM-ALBUTEROL 20-100 MCG/ACT IN AERS: 1.0000 | INHALATION_SPRAY | Freq: Four times a day (QID) | RESPIRATORY_TRACT | 2 refills | Status: DC | PRN

## 2022-05-09 NOTE — Progress Notes (Signed)
  Transition of Care [facility]) Screening Note   Patient Details  Name: [name] Date of Birth: March 10, 1951   [facility]) CM/SW Contact:    Annice Needy, LCSW Phone Number: 05/09/2022, 10:51 AM    Transition of Care Department [facility]) has reviewed patient and no TOC needs have been identified at this time. We will continue to monitor patient advancement through interdisciplinary progression rounds. If new patient transition needs arise, please place a TOC consult.

## 2022-05-09 NOTE — Progress Notes (Signed)
Ng Discharge Note  Admit Date:  05/02/2022 Discharge date: 05/09/2022   TRISTY UDOVICH to be D/C'd Home per MD order.  AVS completed. Patient/caregiver able to verbalize understanding.  Discharge Medication: Allergies as of 05/09/2022       Reactions   Penicillins Anaphylaxis   Aspirin Swelling, Other (See Comments)   Only in large doses will cause a reaction   Bee Venom Swelling   Severe swelling   Contrast Media [iodinated Contrast Media] Swelling   Dilaudid [hydromorphone Hcl] Itching   Lisinopril Swelling   Motrin [ibuprofen] Swelling        Medication List     STOP taking these medications    albuterol 108 (90 Base) MCG/ACT inhaler Commonly known as: VENTOLIN HFA   amLODipine 10 MG tablet Commonly known as: NORVASC   furosemide 20 MG tablet Commonly known as: LASIX   HYDROcodone-acetaminophen 5-325 MG tablet Commonly known as: NORCO/VICODIN   omeprazole 40 MG capsule Commonly known as: PRILOSEC   rosuvastatin 5 MG tablet Commonly known as: CRESTOR       TAKE these medications    acetaminophen 325 MG tablet Commonly known as: TYLENOL Take 2 tablets (650 mg total) by mouth every 6 (six) hours as needed for mild pain or headache (or Fever >/= 101).   ascorbic acid 500 MG tablet Commonly known as: VITAMIN C Take 1 tablet (500 mg total) by mouth daily. Start taking on: May 10, 2022   aspirin EC 81 MG tablet Take 81 mg by mouth daily.   atorvastatin 20 MG tablet Commonly known as: LIPITOR Take 1 tablet (20 mg total) by mouth daily. Start taking on: May 10, 2022   citalopram 20 MG tablet Commonly known as: CELEXA Take 20 mg by mouth daily.   cloNIDine 0.1 MG tablet Commonly known as: CATAPRES Take 3 tablets (0.3 mg total) by mouth 2 (two) times daily. What changed: medication strength   cyanocobalamin 500 MCG tablet Commonly known as: VITAMIN B12 Take 1 tablet (500 mcg total) by mouth daily.   dextromethorphan-guaiFENesin 30-600  MG 12hr tablet Commonly known as: MUCINEX DM Take 1 tablet by mouth 2 (two) times daily.   docusate sodium 100 MG capsule Commonly known as: COLACE Take 100 mg by mouth daily.   folic acid 1 MG tablet Commonly known as: FOLVITE Take 1 tablet (1 mg total) by mouth daily.   Ipratropium-Albuterol 20-100 MCG/ACT Aers respimat Commonly known as: COMBIVENT Inhale 1 puff into the lungs every 6 (six) hours as needed for wheezing or shortness of breath.   loratadine 10 MG tablet Commonly known as: CLARITIN Take 10 mg by mouth daily.   megestrol 400 MG/10ML suspension Commonly known as: MEGACE Take 10 mLs (400 mg total) by mouth 2 (two) times daily.   metoprolol tartrate 50 MG tablet Commonly known as: LOPRESSOR Take 50 mg by mouth 2 (two) times daily.   mirtazapine 7.5 MG tablet Commonly known as: REMERON Take 7.5 mg by mouth at bedtime.   pantoprazole 40 MG tablet Commonly known as: Protonix Take 1 tablet (40 mg total) by mouth 2 (two) times daily.   predniSONE 10 MG tablet Commonly known as: DELTASONE Take 4 tablets by mouth daily x1 day; then 3 tablet by mouth daily x1 day; then 2 tablet by mouth daily x2 days; then 1 tablet by mouth daily x3 days and stop prednisone.   zinc sulfate 220 (50 Zn) MG capsule Take 1 capsule (220 mg total) by mouth daily. Start taking on:  May 10, 2022        Discharge Assessment: Vitals:   05/09/22 0049 05/09/22 0459  BP: (!) 128/58 (!) 140/63  Pulse: 93 90  Resp: 20 20  Temp: 99 F (37.2 C) 98.8 F (37.1 C)  SpO2: 96% 97%   Skin clean, dry and intact without evidence of skin break down, no evidence of skin tears noted. IV catheter discontinued intact. Site without signs and symptoms of complications - no redness or edema noted at insertion site, patient denies c/o pain - only slight tenderness at site.  Dressing with slight pressure applied.  D/c Instructions-Education: Discharge instructions given to patient/family with  verbalized understanding. D/c education completed with patient/family including follow up instructions, medication list, d/c activities limitations if indicated, with other d/c instructions as indicated by MD - patient able to verbalize understanding, all questions fully answered. Patient instructed to return to ED, call 911, or call MD for any changes in condition.  Patient escorted via WC, and D/C home via private auto.  Cristal Ford, LPN 12/12/4008 2:72 PM

## 2022-05-09 NOTE — Progress Notes (Signed)
Pt refused all PO medications this AM and assessment. Educated pt on the importance of taking her medications, but still refuses. Pt has no IV access and is a hard stick. Will attempt to obtain IV and continue to monitor pt. MD is aware.

## 2022-05-09 NOTE — Discharge Summary (Signed)
Physician Discharge Summary   Patient: [name] MRN: [id number] DOB: October 23, 1950  Admit date:     05/02/2022  Discharge date: 05/09/22  Discharge Physician: Barton Dubois   PCP: Carrolyn Meiers, MD   Recommendations at discharge:  Repeat basic metabolic panel to follow ultralights renal function Repeat CBC to follow WBCs/hemoglobin and stability. Reassess blood pressure and adjust antihypertensive regimen as needed Make sure patient has follow-up with gastroenterology as instructed. Repeat chest x-ray in 6 weeks to assure resolution of infiltrates.   Discharge Diagnoses: Principal Problem:   COVID-19 virus infection Active Problems:   Acute respiratory failure with hypoxia-Covid Related   AKI (acute kidney injury) ([place])   Essential hypertension   CAD (coronary artery disease), native coronary artery   History of stroke   Noninfectious gastroenteritis   Pressure injury of skin   Nausea vomiting and diarrhea   Dysphagia   Gastroesophageal reflux disease  Resolved Problems: [facility] admission course: As per H&P written by Dr. Denton Brick on 05/02/2022 [name]  is a 71 y.o. female with PMH of CVA, HLD, HTN , poor visual acuity, history of CAD/prior MI, GERD and depressive disorder presents to the ED with abdominal pain nausea vomiting and diarrhea for the last week or so worse over the last couple days -Subjective fevers, chills, myalgias and fatigue as well as malaise Additional history obtained from patient's son [name] at bedside -No headaches no speech or gait concerns -No chest pains no palpitations -Patient has had generalized weakness and occasional dizzy spells -UA is not consistent with a UTI -WBC is 12.9, HGB is 13.8 platelets 147 -COVID-positive, flu negative -Troponin 34 , repeat 30, -EKG sinus rhythm with PVCs -Lipase is 346 -Creatinine is up to 2.44 from a baseline of between 0.7-0.8, anion gap is 11, bicarb is 26 -Chest x-ray  with COPD/emphysema -CT abdomen and pelvis without contrast--- suggest severe acute colitis predominantly involving the mid to distal transverse colon  Assessment and Plan: 1)Acute hypoxic respiratory failure secondary to COVID-19 infection--- The treatment plan and use of medications  for treatment of COVID-19 infection and possible side effects were discussed with patient and her son [name] -Hypoxia resolved. -Patient is positive for COVID-19 infection, no longer requiring oxygen supplementation.  Has completed treatment with multiple BW -Continue steroids tapering at discharge. --Attempt to maintain euvolemic state --Continue zinc and vitamin C as ordered -Albuterol/Combivent inhaler as needed -Afebrile and overall improved.   2)Acute Colitis--- -abnormal findings appreciated on CT abdomen and pelvis without contrast--- (images suggesting severe acute colitis predominantly involving the mid to distal transverse colon) -Patient presented with leukocytosis, abdominal pain and vomiting and diarrhea -Completed empirical therapy with Levaquin and Flagyl while inpatient (patient allergy to penicillins). -???  COVID-related colitis versus bacterial colitis -Leukocytosis persist suspect partly due to use of steroids -No further BMs (patient is not really eating much); chronic uses of laxatives discontinue. -Continue supportive care and maintain adequate hydration.. -Patient will follow-up with GI as an outpatient and there are plans for colonoscopy evaluation if needed.   3)H/o CAD/Prior MI--- no ACS type symptoms at this time --Troponin 34 , repeat 30 -EKG sinus rhythm with PVCs -Continue metoprolol, aspirin and statin. -No Chest pain.   4)AKI----acute kidney injury--suspect due to dehydration in the setting of diarrhea  --Creatinine is up to 2.44 from a baseline of between 0.7-0.8, -Creatinine improved and back to normal currently. -Repeat basic metabolic panel follow-up advised to  maintain adequate hydration -The use of nephrotoxic  agents.     5)HTN---  -Stable and well-controlled -Resume home antihypertensive regimen with adjusted medications as instructed. -Reassess blood pressure and further adjust management as required.  6)H/o CVA---  -no strokelike symptoms at this time. -Continue aspirin and Lipitor for secondary prevention.  -Continue risk factors modification.   7)COPD/Emphysema----no acute exacerbation at this time -Hypoxia has resolved -Continue bronchodilators and steroids at discharge.   8) elevated lipase---346 -CT abdomen and pelvis without abnormalities of the pancreas -Clinically presentation not consistent with acute pancreatitis; and most likely associated with viral infection in the setting of acute phase reactant. -Patient denies nausea and vomiting.   9) anemia and thrombocytopenia--- anemia appears not to be new... -Thrombocytopenia appears to be new, and most likely associated with viral infection. -Repeat CBC to follow hemoglobin trend/stability -No overt bleeding appreciated -Continue PPI twice a day   10) hypophosphatemia/hypokalemia---  -Repeat basic metabolic panel to follow electrolytes trend/stability and further replete as needed.   11)Dysphagia----patient with history of prior stroke, swallowing difficulties making it difficult for patient to have significant/adequate oral intake -Encourage to follow diet consistency as recommended and to maintain adequate hydration. -Endoscopic evaluation demonstrating esophagitis, stricture and gastritis; PPI twice a day recommended.  Outpatient follow-up in 3 weeks planned by GI service.    12) anorexia/adult FTT ----- -continue the use of Megace and nightly Remeron for appetite stimulation -Patient has been encouraged to increase oral intake. -Patient has expressed that her anorexia was mainly driven by difficulty swallowing. -Appreciate assistance and recommendations by  gastroenterology service; status post endoscopic evaluation demonstrating stricture esophagitis/gastritis. -Patient is status post dilatation and with instruction to continue PPI twice a day. -Outpatient follow-up with GI in 3 weeks. -Body mass index is 17.22 kg/m. -Patient encouraged to advance diet as instructed and to use feeding supplements.   Consultants: Gastroenterology service Procedures performed: See below for x-ray reports.  EGD demonstrating esophagitis, esophageal stricture and gastritis.  No bleeding or masses. Disposition: Home with home health services. Diet recommendation: Heart healthy diet; dysphagia 2 regarding consistency.  DISCHARGE MEDICATION: Allergies as of 05/09/2022       Reactions   Penicillins Anaphylaxis   Aspirin Swelling, Other (See Comments)   Only in large doses will cause a reaction   Bee Venom Swelling   Severe swelling   Contrast Media [iodinated Contrast Media] Swelling   Dilaudid [hydromorphone Hcl] Itching   Lisinopril Swelling   Motrin [ibuprofen] Swelling        Medication List     STOP taking these medications    albuterol 108 (90 Base) MCG/ACT inhaler Commonly known as: VENTOLIN HFA   amLODipine 10 MG tablet Commonly known as: NORVASC   furosemide 20 MG tablet Commonly known as: LASIX   HYDROcodone-acetaminophen 5-325 MG tablet Commonly known as: NORCO/VICODIN   omeprazole 40 MG capsule Commonly known as: PRILOSEC   rosuvastatin 5 MG tablet Commonly known as: CRESTOR       TAKE these medications    acetaminophen 325 MG tablet Commonly known as: TYLENOL Take 2 tablets (650 mg total) by mouth every 6 (six) hours as needed for mild pain or headache (or Fever >/= 101).   ascorbic acid 500 MG tablet Commonly known as: VITAMIN C Take 1 tablet (500 mg total) by mouth daily. Start taking on: May 10, 2022   aspirin EC 81 MG tablet Take 81 mg by mouth daily.   atorvastatin 20 MG tablet Commonly known as:  LIPITOR Take 1 tablet (20 mg total) by mouth  daily. Start taking on: May 10, 2022   citalopram 20 MG tablet Commonly known as: CELEXA Take 20 mg by mouth daily.   cloNIDine 0.1 MG tablet Commonly known as: CATAPRES Take 3 tablets (0.3 mg total) by mouth 2 (two) times daily. What changed: medication strength   cyanocobalamin 500 MCG tablet Commonly known as: VITAMIN B12 Take 1 tablet (500 mcg total) by mouth daily.   dextromethorphan-guaiFENesin 30-600 MG 12hr tablet Commonly known as: MUCINEX DM Take 1 tablet by mouth 2 (two) times daily.   docusate sodium 100 MG capsule Commonly known as: COLACE Take 100 mg by mouth daily.   folic acid 1 MG tablet Commonly known as: FOLVITE Take 1 tablet (1 mg total) by mouth daily.   Ipratropium-Albuterol 20-100 MCG/ACT Aers respimat Commonly known as: COMBIVENT Inhale 1 puff into the lungs every 6 (six) hours as needed for wheezing or shortness of breath.   loratadine 10 MG tablet Commonly known as: CLARITIN Take 10 mg by mouth daily.   megestrol 400 MG/10ML suspension Commonly known as: MEGACE Take 10 mLs (400 mg total) by mouth 2 (two) times daily.   metoprolol tartrate 50 MG tablet Commonly known as: LOPRESSOR Take 50 mg by mouth 2 (two) times daily.   mirtazapine 7.5 MG tablet Commonly known as: REMERON Take 7.5 mg by mouth at bedtime.   pantoprazole 40 MG tablet Commonly known as: Protonix Take 1 tablet (40 mg total) by mouth 2 (two) times daily.   predniSONE 10 MG tablet Commonly known as: DELTASONE Take 4 tablets by mouth daily x1 day; then 3 tablet by mouth daily x1 day; then 2 tablet by mouth daily x2 days; then 1 tablet by mouth daily x3 days and stop prednisone.   zinc sulfate 220 (50 Zn) MG capsule Take 1 capsule (220 mg total) by mouth daily. Start taking on: May 10, 2022        Follow-up Information     [name], Wayland Salinas, MD. Schedule an appointment as soon as possible for a visit in 10  day(s).   Specialty: Internal Medicine Contact information: [address] [place] [address] [phone]                Discharge Exam: Ceasar Mons Weights   05/02/22 1950  Weight: 40 kg   General exam: Alert, awake, oriented x 3; no requiring oxygen supplementation, expressing feeling much better and ready to go home.  Status post endoscopic evaluation with esophageal dilatation so far well-tolerated.  Expressing improvement in her appetite and has been able to eat and drink some. Respiratory system: Good air movement bilaterally; no using accessory muscles.  Good saturation on room air. Cardiovascular system:RRR. No rubs or gallops; no JVD. Gastrointestinal system: Abdomen is nondistended, soft and nontender. No organomegaly or masses felt. Normal bowel sounds heard. Central nervous system: Alert and oriented. No new focal neurological deficits. Extremities: No masses or clubbing. Skin: No petechiae. Psychiatry: Judgement and insight appear normal. Mood & affect appropriate.     Condition at discharge: Stable and improved.  The results of significant diagnostics from this hospitalization (including imaging, microbiology, ancillary and laboratory) are listed below for reference.   Imaging Studies: DG Chest 2 View  Result Date: 05/02/2022 CLINICAL DATA:  71 year old female with history of abdominal pain, vomiting and diarrhea. EXAM: CHEST - 2 VIEW COMPARISON:  Chest x-ray 10/09/2021. FINDINGS: Lung volumes are increased with diffuse emphysematous changes. No consolidative airspace disease. No pleural effusions. No pneumothorax. No pulmonary nodule or  mass noted. Pulmonary vasculature and the cardiomediastinal silhouette are within normal limits. Atherosclerosis in the thoracic aorta. IMPRESSION: 1. No radiographic evidence of acute cardiopulmonary disease. 2. Emphysema. 3. Aortic atherosclerosis. Electronically Signed   By: Vinnie Langton M.D.   On: 05/02/2022 08:51    CT ABDOMEN PELVIS WO CONTRAST  Result Date: 05/02/2022 CLINICAL DATA:  71 year old female with history of acute onset of nonlocalized abdominal pain, vomiting and diarrhea for the past 3 days. EXAM: CT ABDOMEN AND PELVIS WITHOUT CONTRAST TECHNIQUE: Multidetector CT imaging of the abdomen and pelvis was performed following the standard protocol without IV contrast. RADIATION DOSE REDUCTION: This exam was performed according to the departmental dose-optimization program which includes automated exposure control, adjustment of the mA and/or kV according to patient size and/or use of iterative reconstruction technique. COMPARISON:  CT of the abdomen and pelvis 10/09/2021. FINDINGS: Lower chest: Diffuse bronchial wall thickening and emphysematous changes in the lung bases bilaterally. Small right-sided Bochdalek's hernia. Scarring in the lung bases bilaterally. Atherosclerotic calcifications in the descending thoracic aorta as well as the left anterior descending, left circumflex and right coronary arteries. Hepatobiliary: There are low-attenuation lesions in the liver, incompletely characterized on today's noncontrast CT examination, previously characterized as simple cysts on prior abdominal MRI 10/14/2021 (no imaging follow-up is recommended), measuring up to 1.3 cm in segment 4A. No other suspicious appearing hepatic lesions are noted. Pancreas: No definite pancreatic mass or peripancreatic fluid collections or inflammatory changes are noted on today's noncontrast CT examination. Spleen: Unremarkable. Adrenals/Urinary Tract: Large low-attenuation lesion measuring 5.4 cm in the upper pole of the left kidney, and smaller 1.2 cm low-attenuation lesion in the interpolar region of the left kidney, incompletely characterized on today's noncontrast CT examination, but previously characterized as simple cysts on MRI 10/14/2021 (no imaging follow-up is recommended). Right kidney and bilateral adrenal glands are  unremarkable in appearance. No hydroureteronephrosis. Urinary bladder is unremarkable in appearance. Stomach/Bowel: The appearance of the stomach is normal. There is no pathologic dilatation of small bowel or colon. There is profound mural thickening and extensive surrounding inflammatory changes associated with the mid and distal transverse colon, concerning for severe acute colitis. The appendix is not confidently identified and may be surgically absent. Regardless, there are no inflammatory changes noted adjacent to the cecum to suggest the presence of an acute appendicitis at this time. Vascular/Lymphatic: Atherosclerotic calcifications throughout the abdominal aorta and pelvic vasculature. No definite lymphadenopathy confidently identified in the abdomen or pelvis on today's noncontrast CT examination. Reproductive: Status post hysterectomy. Ovaries are not confidently identified may be surgically absent or atrophic. Other: No significant volume of ascites.  No pneumoperitoneum. Musculoskeletal: There are no aggressive appearing lytic or blastic lesions noted in the visualized portions of the skeleton. IMPRESSION: 1. Findings suggest severe acute colitis, predominantly involving the mid to distal transverse colon. 2. Aortic atherosclerosis, in addition to at least three-vessel coronary artery disease. Assessment for potential risk factor modification, dietary therapy or pharmacologic therapy may be warranted, if clinically indicated. 3. Emphysema. 4. Additional incidental findings, as above. Electronically Signed   By: Vinnie Langton M.D.   On: 05/02/2022 08:50    Microbiology: Results for orders placed or performed during the hospital encounter of 05/02/22  Resp Panel by RT-PCR (Flu A&B, Covid) Anterior Nasal Swab     Status: Abnormal   Collection Time: 05/02/22  7:28 AM   Specimen: Anterior Nasal Swab  Result Value Ref Range Status   SARS Coronavirus 2 by RT PCR POSITIVE (A) NEGATIVE  Final     Comment: (NOTE) SARS-CoV-2 target nucleic acids are DETECTED.  The SARS-CoV-2 RNA is generally detectable in upper respiratory specimens during the acute phase of infection. Positive results are indicative of the presence of the identified virus, but do not rule out bacterial infection or co-infection with other pathogens not detected by the test. Clinical correlation with patient history and other diagnostic information is necessary to determine patient infection status. The expected result is Negative.  Fact Sheet for Patients: EntrepreneurPulse.com.au  Fact Sheet for Healthcare Providers: IncredibleEmployment.be  This test is not yet approved or cleared by the [place] FDA and  has been authorized for detection and/or diagnosis of SARS-CoV-2 by FDA under an Emergency Use Authorization (EUA).  This EUA will remain in effect (meaning this test can be used) for the duration of  the COVID-19 declaration under Section 564(b)(1) of the A ct, 21 U.S.C. section 360bbb-3(b)(1), unless the authorization is terminated or revoked sooner.     Influenza A by PCR NEGATIVE NEGATIVE Final   Influenza B by PCR NEGATIVE NEGATIVE Final    Comment: (NOTE) The Xpert Xpress SARS-CoV-2/FLU/RSV plus assay is intended as an aid in the diagnosis of influenza from Nasopharyngeal swab specimens and should not be used as a sole basis for treatment. Nasal washings and aspirates are unacceptable for Xpert Xpress SARS-CoV-2/FLU/RSV testing.  Fact Sheet for Patients: EntrepreneurPulse.com.au  Fact Sheet for Healthcare Providers: IncredibleEmployment.be  This test is not yet approved or cleared by the [place] FDA and has been authorized for detection and/or diagnosis of SARS-CoV-2 by FDA under an Emergency Use Authorization (EUA). This EUA will remain in effect (meaning this test can be used) for the duration of  the COVID-19 declaration under Section 564(b)(1) of the Act, 21 U.S.C. section 360bbb-3(b)(1), unless the authorization is terminated or revoked.  Performed at [facility], [address], [place], [place] [address]     Labs: CBC: Recent Labs  Lab 05/03/22 0442 05/04/22 0517 05/05/22 0420  WBC 15.9* 21.4* 21.2*  NEUTROABS 14.0* 19.8* 19.5*  HGB 11.2* 10.9* 11.1*  HCT 34.8* 33.5* 35.0*  MCV 100.0 99.7 100.3*  PLT 143* 168 [id number]   Basic Metabolic Panel: Recent Labs  Lab 05/03/22 0442 05/04/22 0517 05/05/22 0420 05/06/22 0429 05/08/22 0405  NA 144 141 140 143 142  K 3.1* 3.2* 3.5 3.1* 3.0*  CL 114* 114* 115* 112* 111  CO2 23 21* 20* 26 26  GLUCOSE 146* 152* 147* 115* 163*  BUN 24* 18 13 13 13   CREATININE 1.62* 1.33* 1.07* 1.03* 0.89  CALCIUM 8.4* 7.9* 8.0* 8.0* 7.8*  MG 1.9 1.7 2.0  --   --   PHOS 2.3* 2.1* 2.3* 1.2*  --    Liver Function Tests: Recent Labs  Lab 05/03/22 0442 05/04/22 0517 05/05/22 0420 05/06/22 0429  AST 160* 181* 135*  --   ALT 72* 85* 78*  --   ALKPHOS 96 93 92  --   BILITOT 1.0 0.9 0.9  --   PROT 6.5 6.3* 6.2*  --   ALBUMIN 3.0* 2.9* 3.0* 2.9*   CBG: Recent Labs  Lab 05/08/22 1245 05/08/22 1708 05/08/22 2100 05/09/22 0735 05/09/22 1124  GLUCAP 186* 96 204* 120* 118*    Discharge time spent: greater than 30 minutes.  Signed: Barton Dubois, MD [facility] 05/09/2022

## 2022-05-09 NOTE — Progress Notes (Signed)
Patient refused all bedtime po medications. IV medication and fluids delayed due to no IV access. Access was obtained. MD aware of above.

## 2022-05-12 LAB — SURGICAL PATHOLOGY

## 2022-05-13 ENCOUNTER — Other Ambulatory Visit (INDEPENDENT_AMBULATORY_CARE_PROVIDER_SITE_OTHER): Payer: Self-pay | Admitting: Gastroenterology

## 2022-05-13 DIAGNOSIS — B3781 Candidal esophagitis: Secondary | ICD-10-CM

## 2022-05-13 MED ORDER — FLUCONAZOLE 150 MG PO TABS: 150.0000 mg | ORAL_TABLET | Freq: Every day | ORAL | 0 refills | Status: DC

## 2022-05-15 ENCOUNTER — Encounter (HOSPITAL_COMMUNITY): Payer: Self-pay | Admitting: Gastroenterology

## 2022-05-28 ENCOUNTER — Inpatient Hospital Stay (HOSPITAL_COMMUNITY)
Admission: EM | Admit: 2022-05-28 | Discharge: 2022-06-17 | DRG: 207 | Disposition: A | Discharge status: Skilled Nursing Facility | Payer: Medicare Other | Attending: Internal Medicine | Admitting: Internal Medicine

## 2022-05-28 ENCOUNTER — Other Ambulatory Visit: Payer: Self-pay

## 2022-05-28 ENCOUNTER — Encounter (HOSPITAL_COMMUNITY): Payer: Self-pay | Admitting: Emergency Medicine

## 2022-05-28 ENCOUNTER — Observation Stay (HOSPITAL_COMMUNITY): Payer: Medicare Other

## 2022-05-28 ENCOUNTER — Emergency Department (HOSPITAL_COMMUNITY): Payer: Medicare Other

## 2022-05-28 DIAGNOSIS — J9601 Acute respiratory failure with hypoxia: Principal | ICD-10-CM

## 2022-05-28 DIAGNOSIS — J449 Chronic obstructive pulmonary disease, unspecified: Secondary | ICD-10-CM | POA: Diagnosis not present

## 2022-05-28 DIAGNOSIS — Z79899 Other long term (current) drug therapy: Secondary | ICD-10-CM

## 2022-05-28 DIAGNOSIS — Z87891 Personal history of nicotine dependence: Secondary | ICD-10-CM

## 2022-05-28 DIAGNOSIS — E870 Hyperosmolality and hypernatremia: Secondary | ICD-10-CM

## 2022-05-28 DIAGNOSIS — J9602 Acute respiratory failure with hypercapnia: Secondary | ICD-10-CM | POA: Diagnosis not present

## 2022-05-28 DIAGNOSIS — I10 Essential (primary) hypertension: Secondary | ICD-10-CM | POA: Diagnosis present

## 2022-05-28 DIAGNOSIS — I429 Cardiomyopathy, unspecified: Secondary | ICD-10-CM | POA: Diagnosis not present

## 2022-05-28 DIAGNOSIS — D696 Thrombocytopenia, unspecified: Secondary | ICD-10-CM | POA: Diagnosis not present

## 2022-05-28 DIAGNOSIS — Z880 Allergy status to penicillin: Secondary | ICD-10-CM

## 2022-05-28 DIAGNOSIS — Z8673 Personal history of transient ischemic attack (TIA), and cerebral infarction without residual deficits: Secondary | ICD-10-CM | POA: Diagnosis not present

## 2022-05-28 DIAGNOSIS — D649 Anemia, unspecified: Secondary | ICD-10-CM

## 2022-05-28 DIAGNOSIS — R778 Other specified abnormalities of plasma proteins: Secondary | ICD-10-CM | POA: Diagnosis not present

## 2022-05-28 DIAGNOSIS — H5461 Unqualified visual loss, right eye, normal vision left eye: Secondary | ICD-10-CM | POA: Diagnosis present

## 2022-05-28 DIAGNOSIS — Z90722 Acquired absence of ovaries, bilateral: Secondary | ICD-10-CM

## 2022-05-28 DIAGNOSIS — E873 Alkalosis: Secondary | ICD-10-CM | POA: Diagnosis not present

## 2022-05-28 DIAGNOSIS — R7989 Other specified abnormal findings of blood chemistry: Secondary | ICD-10-CM

## 2022-05-28 DIAGNOSIS — N1831 Chronic kidney disease, stage 3a: Secondary | ICD-10-CM

## 2022-05-28 DIAGNOSIS — R0602 Shortness of breath: Secondary | ICD-10-CM | POA: Diagnosis not present

## 2022-05-28 DIAGNOSIS — K219 Gastro-esophageal reflux disease without esophagitis: Secondary | ICD-10-CM | POA: Diagnosis not present

## 2022-05-28 DIAGNOSIS — Z7189 Other specified counseling: Secondary | ICD-10-CM | POA: Diagnosis not present

## 2022-05-28 DIAGNOSIS — I248 Other forms of acute ischemic heart disease: Secondary | ICD-10-CM | POA: Diagnosis present

## 2022-05-28 DIAGNOSIS — R64 Cachexia: Secondary | ICD-10-CM | POA: Diagnosis present

## 2022-05-28 DIAGNOSIS — R000 Tachycardia, unspecified: Secondary | ICD-10-CM | POA: Diagnosis not present

## 2022-05-28 DIAGNOSIS — I130 Hypertensive heart and chronic kidney disease with heart failure and stage 1 through stage 4 chronic kidney disease, or unspecified chronic kidney disease: Secondary | ICD-10-CM | POA: Diagnosis not present

## 2022-05-28 DIAGNOSIS — E876 Hypokalemia: Secondary | ICD-10-CM | POA: Diagnosis not present

## 2022-05-28 DIAGNOSIS — I5181 Takotsubo syndrome: Secondary | ICD-10-CM

## 2022-05-28 DIAGNOSIS — Z9079 Acquired absence of other genital organ(s): Secondary | ICD-10-CM

## 2022-05-28 DIAGNOSIS — Z8616 Personal history of COVID-19: Secondary | ICD-10-CM | POA: Diagnosis not present

## 2022-05-28 DIAGNOSIS — I2510 Atherosclerotic heart disease of native coronary artery without angina pectoris: Secondary | ICD-10-CM | POA: Diagnosis not present

## 2022-05-28 DIAGNOSIS — I080 Rheumatic disorders of both mitral and aortic valves: Secondary | ICD-10-CM | POA: Diagnosis present

## 2022-05-28 DIAGNOSIS — R079 Chest pain, unspecified: Secondary | ICD-10-CM | POA: Diagnosis not present

## 2022-05-28 DIAGNOSIS — I214 Non-ST elevation (NSTEMI) myocardial infarction: Secondary | ICD-10-CM | POA: Diagnosis not present

## 2022-05-28 DIAGNOSIS — J441 Chronic obstructive pulmonary disease with (acute) exacerbation: Secondary | ICD-10-CM

## 2022-05-28 DIAGNOSIS — I493 Ventricular premature depolarization: Secondary | ICD-10-CM | POA: Diagnosis not present

## 2022-05-28 DIAGNOSIS — R7302 Impaired glucose tolerance (oral): Secondary | ICD-10-CM | POA: Diagnosis not present

## 2022-05-28 DIAGNOSIS — F419 Anxiety disorder, unspecified: Secondary | ICD-10-CM | POA: Diagnosis present

## 2022-05-28 DIAGNOSIS — I428 Other cardiomyopathies: Secondary | ICD-10-CM | POA: Diagnosis not present

## 2022-05-28 DIAGNOSIS — R4182 Altered mental status, unspecified: Secondary | ICD-10-CM | POA: Diagnosis not present

## 2022-05-28 DIAGNOSIS — Z955 Presence of coronary angioplasty implant and graft: Secondary | ICD-10-CM

## 2022-05-28 DIAGNOSIS — J439 Emphysema, unspecified: Secondary | ICD-10-CM | POA: Diagnosis not present

## 2022-05-28 DIAGNOSIS — Z22322 Carrier or suspected carrier of Methicillin resistant Staphylococcus aureus: Secondary | ICD-10-CM

## 2022-05-28 DIAGNOSIS — R197 Diarrhea, unspecified: Secondary | ICD-10-CM | POA: Diagnosis not present

## 2022-05-28 DIAGNOSIS — R1310 Dysphagia, unspecified: Secondary | ICD-10-CM | POA: Diagnosis not present

## 2022-05-28 DIAGNOSIS — R531 Weakness: Secondary | ICD-10-CM | POA: Diagnosis not present

## 2022-05-28 DIAGNOSIS — Z515 Encounter for palliative care: Secondary | ICD-10-CM | POA: Diagnosis not present

## 2022-05-28 DIAGNOSIS — I69398 Other sequelae of cerebral infarction: Secondary | ICD-10-CM | POA: Diagnosis not present

## 2022-05-28 DIAGNOSIS — J15212 Pneumonia due to Methicillin resistant Staphylococcus aureus: Secondary | ICD-10-CM | POA: Diagnosis present

## 2022-05-28 DIAGNOSIS — F323 Major depressive disorder, single episode, severe with psychotic features: Secondary | ICD-10-CM | POA: Diagnosis present

## 2022-05-28 DIAGNOSIS — I340 Nonrheumatic mitral (valve) insufficiency: Secondary | ICD-10-CM | POA: Diagnosis not present

## 2022-05-28 DIAGNOSIS — J181 Lobar pneumonia, unspecified organism: Secondary | ICD-10-CM

## 2022-05-28 DIAGNOSIS — J90 Pleural effusion, not elsewhere classified: Secondary | ICD-10-CM | POA: Diagnosis not present

## 2022-05-28 DIAGNOSIS — I272 Pulmonary hypertension, unspecified: Secondary | ICD-10-CM | POA: Diagnosis present

## 2022-05-28 DIAGNOSIS — E875 Hyperkalemia: Secondary | ICD-10-CM

## 2022-05-28 DIAGNOSIS — N179 Acute kidney failure, unspecified: Secondary | ICD-10-CM | POA: Diagnosis present

## 2022-05-28 DIAGNOSIS — Z781 Physical restraint status: Secondary | ICD-10-CM

## 2022-05-28 DIAGNOSIS — Z7982 Long term (current) use of aspirin: Secondary | ICD-10-CM

## 2022-05-28 DIAGNOSIS — I25110 Atherosclerotic heart disease of native coronary artery with unstable angina pectoris: Secondary | ICD-10-CM | POA: Diagnosis not present

## 2022-05-28 DIAGNOSIS — R636 Underweight: Secondary | ICD-10-CM | POA: Diagnosis present

## 2022-05-28 DIAGNOSIS — R2689 Other abnormalities of gait and mobility: Secondary | ICD-10-CM | POA: Diagnosis not present

## 2022-05-28 DIAGNOSIS — I25119 Atherosclerotic heart disease of native coronary artery with unspecified angina pectoris: Secondary | ICD-10-CM | POA: Diagnosis not present

## 2022-05-28 DIAGNOSIS — K5904 Chronic idiopathic constipation: Secondary | ICD-10-CM | POA: Diagnosis present

## 2022-05-28 DIAGNOSIS — L89152 Pressure ulcer of sacral region, stage 2: Secondary | ICD-10-CM | POA: Diagnosis not present

## 2022-05-28 DIAGNOSIS — R0902 Hypoxemia: Secondary | ICD-10-CM | POA: Diagnosis not present

## 2022-05-28 DIAGNOSIS — M6281 Muscle weakness (generalized): Secondary | ICD-10-CM | POA: Diagnosis not present

## 2022-05-28 DIAGNOSIS — I252 Old myocardial infarction: Secondary | ICD-10-CM

## 2022-05-28 DIAGNOSIS — Z681 Body mass index (BMI) 19 or less, adult: Secondary | ICD-10-CM | POA: Diagnosis not present

## 2022-05-28 DIAGNOSIS — Z8249 Family history of ischemic heart disease and other diseases of the circulatory system: Secondary | ICD-10-CM

## 2022-05-28 DIAGNOSIS — I5020 Unspecified systolic (congestive) heart failure: Secondary | ICD-10-CM | POA: Diagnosis not present

## 2022-05-28 DIAGNOSIS — R1312 Dysphagia, oropharyngeal phase: Secondary | ICD-10-CM | POA: Diagnosis present

## 2022-05-28 DIAGNOSIS — E43 Unspecified severe protein-calorie malnutrition: Secondary | ICD-10-CM | POA: Insufficient documentation

## 2022-05-28 DIAGNOSIS — Z743 Need for continuous supervision: Secondary | ICD-10-CM | POA: Diagnosis not present

## 2022-05-28 DIAGNOSIS — Z90710 Acquired absence of both cervix and uterus: Secondary | ICD-10-CM

## 2022-05-28 DIAGNOSIS — R918 Other nonspecific abnormal finding of lung field: Secondary | ICD-10-CM | POA: Diagnosis not present

## 2022-05-28 DIAGNOSIS — G9341 Metabolic encephalopathy: Secondary | ICD-10-CM | POA: Diagnosis not present

## 2022-05-28 DIAGNOSIS — Z7401 Bed confinement status: Secondary | ICD-10-CM | POA: Diagnosis not present

## 2022-05-28 DIAGNOSIS — R739 Hyperglycemia, unspecified: Secondary | ICD-10-CM | POA: Diagnosis not present

## 2022-05-28 DIAGNOSIS — Z885 Allergy status to narcotic agent status: Secondary | ICD-10-CM

## 2022-05-28 DIAGNOSIS — J9690 Respiratory failure, unspecified, unspecified whether with hypoxia or hypercapnia: Secondary | ICD-10-CM | POA: Diagnosis not present

## 2022-05-28 DIAGNOSIS — Z20822 Contact with and (suspected) exposure to covid-19: Secondary | ICD-10-CM | POA: Diagnosis present

## 2022-05-28 DIAGNOSIS — E440 Moderate protein-calorie malnutrition: Secondary | ICD-10-CM | POA: Diagnosis not present

## 2022-05-28 DIAGNOSIS — I2582 Chronic total occlusion of coronary artery: Secondary | ICD-10-CM | POA: Diagnosis present

## 2022-05-28 DIAGNOSIS — Z91041 Radiographic dye allergy status: Secondary | ICD-10-CM

## 2022-05-28 DIAGNOSIS — Z91030 Bee allergy status: Secondary | ICD-10-CM

## 2022-05-28 DIAGNOSIS — U071 COVID-19: Secondary | ICD-10-CM | POA: Diagnosis not present

## 2022-05-28 DIAGNOSIS — D539 Nutritional anemia, unspecified: Secondary | ICD-10-CM | POA: Diagnosis not present

## 2022-05-28 DIAGNOSIS — F05 Delirium due to known physiological condition: Secondary | ICD-10-CM | POA: Diagnosis not present

## 2022-05-28 DIAGNOSIS — I471 Supraventricular tachycardia: Secondary | ICD-10-CM | POA: Diagnosis not present

## 2022-05-28 DIAGNOSIS — Z9049 Acquired absence of other specified parts of digestive tract: Secondary | ICD-10-CM

## 2022-05-28 DIAGNOSIS — T380X5A Adverse effect of glucocorticoids and synthetic analogues, initial encounter: Secondary | ICD-10-CM | POA: Diagnosis not present

## 2022-05-28 DIAGNOSIS — U099 Post covid-19 condition, unspecified: Secondary | ICD-10-CM | POA: Diagnosis present

## 2022-05-28 DIAGNOSIS — D6489 Other specified anemias: Secondary | ICD-10-CM | POA: Diagnosis present

## 2022-05-28 DIAGNOSIS — I5041 Acute combined systolic (congestive) and diastolic (congestive) heart failure: Secondary | ICD-10-CM | POA: Diagnosis not present

## 2022-05-28 DIAGNOSIS — I5021 Acute systolic (congestive) heart failure: Secondary | ICD-10-CM | POA: Diagnosis not present

## 2022-05-28 DIAGNOSIS — E785 Hyperlipidemia, unspecified: Secondary | ICD-10-CM | POA: Diagnosis present

## 2022-05-28 DIAGNOSIS — Z886 Allergy status to analgesic agent status: Secondary | ICD-10-CM

## 2022-05-28 LAB — CBC WITH DIFFERENTIAL/PLATELET
Abs Immature Granulocytes: 0.05 10*3/uL (ref 0.00–0.07)
Basophils Absolute: 0 10*3/uL (ref 0.0–0.1)
Basophils Relative: 0 %
Eosinophils Absolute: 0.1 10*3/uL (ref 0.0–0.5)
Eosinophils Relative: 1 %
HCT: 28.5 % — ABNORMAL LOW (ref 36.0–46.0)
Hemoglobin: 8.6 g/dL — ABNORMAL LOW (ref 12.0–15.0)
Immature Granulocytes: 1 %
Lymphocytes Relative: 26 %
Lymphs Abs: 1.6 10*3/uL (ref 0.7–4.0)
MCH: 34.8 pg — ABNORMAL HIGH (ref 26.0–34.0)
MCHC: 30.2 g/dL (ref 30.0–36.0)
MCV: 115.4 fL — ABNORMAL HIGH (ref 80.0–100.0)
Monocytes Absolute: 0.9 10*3/uL (ref 0.1–1.0)
Monocytes Relative: 13 %
Neutro Abs: 3.7 10*3/uL (ref 1.7–7.7)
Neutrophils Relative %: 59 %
Platelets: 341 10*3/uL (ref 150–400)
RBC: 2.47 MIL/uL — ABNORMAL LOW (ref 3.87–5.11)
RDW: 17.7 % — ABNORMAL HIGH (ref 11.5–15.5)
WBC: 6.3 10*3/uL (ref 4.0–10.5)
nRBC: 0 % (ref 0.0–0.2)

## 2022-05-28 LAB — BLOOD GAS, ARTERIAL
Acid-base deficit: 5.1 mmol/L — ABNORMAL HIGH (ref 0.0–2.0)
Acid-base deficit: 3.8 mmol/L — ABNORMAL HIGH (ref 0.0–2.0)
Bicarbonate: 23.7 mmol/L (ref 20.0–28.0)
Bicarbonate: 21.5 mmol/L (ref 20.0–28.0)
Drawn by: 35043
Drawn by: 35043
O2 Saturation: 33.7 %
O2 Saturation: 99.6 %
Patient temperature: 36.9
Patient temperature: 36.6
pCO2 arterial: 65 mmHg — ABNORMAL HIGH (ref 32–48)
pCO2 arterial: 38 mmHg (ref 32–48)
pH, Arterial: 7.17 — CL (ref 7.35–7.45)
pH, Arterial: 7.36 (ref 7.35–7.45)
pO2, Arterial: <31 mmHg — CL (ref 83–108)
pO2, Arterial: 193 mmHg — ABNORMAL HIGH (ref 83–108)

## 2022-05-28 LAB — BLOOD GAS, VENOUS
Acid-base deficit: 4.5 mmol/L — ABNORMAL HIGH (ref 0.0–2.0)
Bicarbonate: 23.6 mmol/L (ref 20.0–28.0)
Drawn by: 12815
O2 Saturation: 22.9 %
Patient temperature: 37.6
pCO2, Ven: 61 mmHg — ABNORMAL HIGH (ref 44–60)
pH, Ven: 7.2 — ABNORMAL LOW (ref 7.25–7.43)
pO2, Ven: <31 mmHg — CL (ref 32–45)

## 2022-05-28 LAB — COMPREHENSIVE METABOLIC PANEL
ALT: 32 U/L (ref 0–44)
AST: 39 U/L (ref 15–41)
Albumin: 3.3 g/dL — ABNORMAL LOW (ref 3.5–5.0)
Alkaline Phosphatase: 107 U/L (ref 38–126)
Anion gap: 6 (ref 5–15)
BUN: 17 mg/dL (ref 8–23)
CO2: 22 mmol/L (ref 22–32)
Calcium: 8.6 mg/dL — ABNORMAL LOW (ref 8.9–10.3)
Chloride: 109 mmol/L (ref 98–111)
Creatinine, Ser: 1.08 mg/dL — ABNORMAL HIGH (ref 0.44–1.00)
GFR, Estimated: 55 mL/min — ABNORMAL LOW (ref >60)
Glucose, Bld: 120 mg/dL — ABNORMAL HIGH (ref 70–99)
Potassium: 4.3 mmol/L (ref 3.5–5.1)
Sodium: 137 mmol/L (ref 135–145)
Total Bilirubin: 0.6 mg/dL (ref 0.3–1.2)
Total Protein: 7.5 g/dL (ref 6.5–8.1)

## 2022-05-28 LAB — BRAIN NATRIURETIC PEPTIDE: B Natriuretic Peptide: 72 pg/mL (ref 0.0–100.0)

## 2022-05-28 LAB — RESP PANEL BY RT-PCR (FLU A&B, COVID) ARPGX2
Influenza A by PCR: NEGATIVE
Influenza B by PCR: NEGATIVE
SARS Coronavirus 2 by RT PCR: NEGATIVE

## 2022-05-28 LAB — D-DIMER, QUANTITATIVE: D-Dimer, Quant: 3.97 ug/mL-FEU — ABNORMAL HIGH (ref 0.00–0.50)

## 2022-05-28 LAB — TROPONIN I (HIGH SENSITIVITY): Troponin I (High Sensitivity): 13 ng/L (ref <18)

## 2022-05-28 MED ORDER — IPRATROPIUM-ALBUTEROL 0.5-2.5 (3) MG/3ML IN SOLN
RESPIRATORY_TRACT | Status: AC
  Administered 2022-05-28: 3 mL
  Filled 2022-05-28: qty 3

## 2022-05-28 MED ORDER — HEPARIN BOLUS VIA INFUSION
2500.0000 [IU] | Freq: Once | INTRAVENOUS | Status: AC
  Administered 2022-05-28: 2500 [IU] via INTRAVENOUS

## 2022-05-28 MED ORDER — ALBUTEROL SULFATE (2.5 MG/3ML) 0.083% IN NEBU
INHALATION_SOLUTION | RESPIRATORY_TRACT | Status: AC
  Administered 2022-05-28: 2.5 mg
  Filled 2022-05-28: qty 3

## 2022-05-28 MED ORDER — SODIUM CHLORIDE 0.9 % IV BOLUS
1000.0000 mL | Freq: Once | INTRAVENOUS | Status: AC
  Administered 2022-05-28: 1000 mL via INTRAVENOUS

## 2022-05-28 MED ORDER — HEPARIN (PORCINE) 25000 UT/250ML-% IV SOLN
700.0000 [IU]/h | INTRAVENOUS | Status: DC
  Administered 2022-05-28: 600 [IU]/h via INTRAVENOUS
  Administered 2022-05-30: 700 [IU]/h via INTRAVENOUS
  Filled 2022-05-28 (×3): qty 250

## 2022-05-28 MED ORDER — LORAZEPAM 2 MG/ML IJ SOLN
0.5000 mg | Freq: Once | INTRAMUSCULAR | Status: AC
Start: 2022-05-28 — End: 2022-05-28
  Administered 2022-05-28: 0.5 mg via INTRAVENOUS
  Filled 2022-05-28: qty 1

## 2022-05-28 NOTE — ED Triage Notes (Signed)
Pt presents with SOB x 2 weeks ago, per admitted and was told she was Covid +, pt O2 77% on room, placed on nonrebreather.

## 2022-05-28 NOTE — ED Notes (Signed)
Resting comfortably on monitor, and Bipap 12/6. Family at [facility]

## 2022-05-28 NOTE — Assessment & Plan Note (Signed)
Denies chest pain, but here with acute hypoxic respiratory failure.  Troponin 37.  EKG without significant ST or T wave changes. -Trend

## 2022-05-28 NOTE — Progress Notes (Signed)
ANTICOAGULATION CONSULT NOTE - Initial Consult  Pharmacy Consult:  Heparin Indication:  Rule out PE  Allergies  Allergen Reactions   Penicillins Anaphylaxis   Aspirin Swelling and Other (See Comments)    Only in large doses will cause a reaction   Bee Venom Swelling    Severe swelling   Contrast Media [Iodinated Contrast Media] Swelling   Dilaudid [Hydromorphone Hcl] Itching   Lisinopril Swelling   Motrin [Ibuprofen] Swelling    Patient Measurements: Height: 5' (152.4 cm) Weight: 40 kg (88 lb 2.9 oz) IBW/kg (Calculated) : 45.5 Heparin Dosing Weight: 40 kg  Vital Signs: Temp: 98.4 F (36.9 C) (09/07 1624) Temp Source: Oral (09/07 1624) BP: 136/67 (09/07 1700) Pulse Rate: 87 (09/07 1700)  Labs: Recent Labs    05/28/22 1640  HGB 8.6*  HCT 28.5*  PLT 341  CREATININE 1.08*  TROPONINIHS 13    Estimated Creatinine Clearance: 29.7 mL/min (A) (by C-G formula based on SCr of 1.08 mg/dL (H)).   Medical History: Past Medical History:  Diagnosis Date   Anxiety    Blind    right   Colitis 09/07/2011   CVA (cerebral infarction)    GERD (gastroesophageal reflux disease)    Hyperlipidemia    Hypertension    Myocardial infarct (HCC)    1997, 2004   Severe major depression with psychotic features (HCC)    2004   Stroke [facility]) 2002    Assessment: 61 YOF presented with SOB in setting of recent Covid.  Pharmacy consulted to dose IV heparin for rule out PE.  Baseline labs reviewed.  Goal of Therapy:  Heparin level 0.3-0.7 units/ml Monitor platelets by anticoagulation protocol: Yes   Plan:  Heparin 2500 units IV bolus, then Heparin gtt at 600 units/hr Check 8 hr heparin level Daily heparin level and CBC  Tamaiya Bump D. Laney Potash, PharmD, BCPS, BCCCP 05/28/2022, 6:05 PM

## 2022-05-28 NOTE — Assessment & Plan Note (Addendum)
-   Presented with respiratory distress with oxygen saturation 77% on room air.  Appears to be secondary to COPD exacerbation and pneumonia -Patient failed the use of BiPAP and in the requiring to be intubated and mechanically ventilated.  (05/30/2022) -CTA chest--neg PE, severe emphysema; multifocal patchy infiltrates RUL, RLL, LLL -Patient has been extubated (06/04/2022); agitated and having difficulties following commands.  Currently 40% FiO2 through T-bar. -Continue to allow sedation to clears off and wean off oxygen supplementation as tolerated  Follow clinical response. -Appreciate pulmonology assistance. -Continue supportive care, current antibiotics and SBT's. -She has failed with plans for extubation today due to agitation and increase tachypnea.

## 2022-05-28 NOTE — Assessment & Plan Note (Addendum)
-  Continue bisoprolol (dose adjusted to 10 mg daily), initiation of losartan and one-time dose of Lasix today. -Follow-up vital signs and heart healthy/low-sodium diet. -Appreciate assistance and recommendation by cardiology service.

## 2022-05-28 NOTE — Progress Notes (Signed)
Gave MD in ED ABG results of pH 7.36, PaCO2 of 38.

## 2022-05-28 NOTE — ED Notes (Signed)
  Patient tolerating BiPAP more comfortably at this time.  Patient able to express needs, joking around and laughing.

## 2022-05-28 NOTE — Assessment & Plan Note (Addendum)
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 -Continue risk factor modification. -No new focal deficits.

## 2022-05-28 NOTE — ED Provider Notes (Signed)
[facility] EMERGENCY DEPARTMENT Provider Note   CSN: [id number] Arrival date & time: 05/28/22  1606     History  Chief Complaint  Patient presents with   Shortness of Breath    [name] is a 71 y.o. female.  With PMH of CVA, HLD, HTN, CAD and prior MI, recent admission in August for acute hypoxic respiratory failure secondary to COVID-19 infection presenting with increasing dyspnea and left-sided chest pain.  Patient has a remote smoking history but no longer smokes.  She denies any history of COPD or CHF.  She notes increasing dyspnea on exertion and orthopnea and dyspnea at rest over the past couple of days but got to the point where she was so short of breath today she presented to the ER.  She notes associated left-sided chest pain worse with breathing.  She also notes some mild swelling in her lower extremities but no significant leg pain.  She is not on any anticoagulation denies any history of PE or DVT.  She has had no fever since going home and has had a cough productive of clear sputum.   Shortness of Breath      Home Medications Prior to Admission medications   Medication Sig Start Date End Date Taking? Authorizing Provider  acetaminophen (TYLENOL) 325 MG tablet Take 2 tablets (650 mg total) by mouth every 6 (six) hours as needed for mild pain or headache (or Fever >/= 101). 05/09/22   Vassie Loll, MD  ascorbic acid (VITAMIN C) 500 MG tablet Take 1 tablet (500 mg total) by mouth daily. 05/10/22   Vassie Loll, MD  aspirin EC 81 MG tablet Take 81 mg by mouth daily.      [provider]  atorvastatin (LIPITOR) 20 MG tablet Take 1 tablet (20 mg total) by mouth daily. 05/10/22   Vassie Loll, MD  citalopram (CELEXA) 20 MG tablet Take 20 mg by mouth daily. 03/31/22   [provider]  cloNIDine (CATAPRES) 0.1 MG tablet Take 3 tablets (0.3 mg total) by mouth 2 (two) times daily. 05/09/22   Vassie Loll, MD  dextromethorphan-guaiFENesin [facility] DM)  30-600 MG per 12 hr tablet Take 1 tablet by mouth 2 (two) times daily.    [provider]  docusate sodium (COLACE) 100 MG capsule Take 100 mg by mouth daily.      [provider]  fluconazole (DIFLUCAN) 150 MG tablet Take 1 tablet (150 mg total) by mouth daily for 22 days. Take 2 pills the first day, then continue one pill daily 05/13/22 06/04/22  Marguerita Merles, Reuel Boom, MD  folic acid (FOLVITE) 1 MG tablet Take 1 tablet (1 mg total) by mouth daily. 10/17/21   Catarina Hartshorn, MD  Ipratropium-Albuterol (COMBIVENT) 20-100 MCG/ACT AERS respimat Inhale 1 puff into the lungs every 6 (six) hours as needed for wheezing or shortness of breath. 05/09/22   Vassie Loll, MD  loratadine (CLARITIN) 10 MG tablet Take 10 mg by mouth daily. 03/12/22   [provider]  megestrol (MEGACE) 400 MG/10ML suspension Take 10 mLs (400 mg total) by mouth 2 (two) times daily. 05/09/22   Vassie Loll, MD  metoprolol tartrate (LOPRESSOR) 50 MG tablet Take 50 mg by mouth 2 (two) times daily. 08/10/21   [provider]  mirtazapine (REMERON) 7.5 MG tablet Take 7.5 mg by mouth at bedtime. 04/27/22   [provider]  pantoprazole (PROTONIX) 40 MG tablet Take 1 tablet (40 mg total) by mouth 2 (two) times daily. 05/09/22 05/09/23  Vassie Loll, MD  predniSONE (DELTASONE) 10 MG tablet Take 4 tablets by mouth daily x1 day; then 3 tablet by mouth daily x1 day; then 2 tablet by mouth daily x2 days; then 1 tablet by mouth daily x3 days and stop prednisone. 05/09/22   Vassie Loll, MD  vitamin B-12 (CYANOCOBALAMIN) 500 MCG tablet Take 1 tablet (500 mcg total) by mouth daily. 10/16/21   Catarina Hartshorn, MD  zinc sulfate 220 (50 Zn) MG capsule Take 1 capsule (220 mg total) by mouth daily. 05/10/22   Vassie Loll, MD      Allergies    Penicillins, Aspirin, Bee venom, Contrast media [iodinated contrast media], Dilaudid [hydromorphone hcl], Lisinopril, and Motrin [ibuprofen]    Review of Systems   Review  of Systems  Respiratory:  Positive for shortness of breath.     Physical Exam Updated Vital Signs BP 95/60   Pulse (!) 119   Temp 97.8 F (36.6 C) (Axillary)   Resp 20   Ht 5' (1.524 m)   Wt 40 kg   SpO2 100%   BMI 17.22 kg/m  Physical Exam  ED Results / Procedures / Treatments   Labs (all labs ordered are listed, but only abnormal results are displayed) Labs Reviewed  COMPREHENSIVE METABOLIC PANEL - Abnormal; Notable for the following components:      Result Value   Glucose, Bld 120 (*)    Creatinine, Ser 1.08 (*)    Calcium 8.6 (*)    Albumin 3.3 (*)    GFR, Estimated 55 (*)    All other components within normal limits  CBC WITH DIFFERENTIAL/PLATELET - Abnormal; Notable for the following components:   RBC 2.47 (*)    Hemoglobin 8.6 (*)    HCT 28.5 (*)    MCV 115.4 (*)    MCH 34.8 (*)    RDW 17.7 (*)    All other components within normal limits  BLOOD GAS, VENOUS - Abnormal; Notable for the following components:   pH, Ven 7.2 (*)    pCO2, Ven 61 (*)    pO2, Ven <31 (*)    Acid-base deficit 4.5 (*)    All other components within normal limits  D-DIMER, QUANTITATIVE - Abnormal; Notable for the following components:   D-Dimer, Quant 3.97 (*)    All other components within normal limits  BLOOD GAS, ARTERIAL - Abnormal; Notable for the following components:   pH, Arterial 7.17 (*)    pCO2 arterial 65 (*)    pO2, Arterial <31 (*)    Acid-base deficit 5.1 (*)    All other components within normal limits  BLOOD GAS, ARTERIAL - Abnormal; Notable for the following components:   pO2, Arterial 193 (*)    Acid-base deficit 3.8 (*)    All other components within normal limits  TROPONIN I (HIGH SENSITIVITY) - Abnormal; Notable for the following components:   Troponin I (High Sensitivity) 375 (*)    All other components within normal limits  RESP PANEL BY RT-PCR (FLU A&B, COVID) [id number]  BRAIN NATRIURETIC PEPTIDE  HEPARIN LEVEL (UNFRACTIONATED)  CBC  TROPONIN I (HIGH  SENSITIVITY)    EKG EKG Interpretation  Date/Time:  Thursday May 28 2022 16:16:05 EDT Ventricular Rate:  144 PR Interval:  115 QRS Duration: 86 QT Interval:  272 QTC Calculation: 421 R Axis:   74 Text Interpretation: Sinus tachycardia Ventricular premature complex Low voltage, precordial leads Artifact in lead(s) I II aVR aVL aVF V1 V2 V4 V5 V6 Limited by motion artifact  Confirmed by Georgina Snell [phone]) on 05/28/2022 4:51:58 PM  Radiology DG Chest Portable 1 View  Result Date: 05/28/2022 CLINICAL DATA:  Shortness of breath for 2 weeks.  COVID positive. EXAM: PORTABLE CHEST 1 VIEW COMPARISON:  05/02/2022 FINDINGS: Lungs are hyperinflated. Significant bullous changes at the RIGHT lung apex, similar to prior. There are no focal consolidations or pleural effusions. No pulmonary edema. IMPRESSION: Hyperinflation of the lungs. No evidence for acute pulmonary abnormality. Electronically Signed   By: Nolon Nations M.D.   On: 05/28/2022 17:12    Procedures .Critical Care  Performed by: Elgie Congo, MD Authorized by: Elgie Congo, MD   Critical care provider statement:    Critical care time (minutes):  50   Critical care was necessary to treat or prevent imminent or life-threatening deterioration of the following conditions:  Respiratory failure and shock   Critical care was time spent personally by me on the following activities:  Development of treatment plan with patient or surrogate, discussions with consultants, evaluation of patient's response to treatment, examination of patient, ordering and review of laboratory studies, ordering and review of radiographic studies, ordering and performing treatments and interventions, pulse oximetry, re-evaluation of patient's condition, review of old charts and obtaining history from patient or surrogate   Care discussed with: admitting provider     Remain on constant cardiac monitoring, sinus tachycardia.  Medications  Ordered in ED Medications  heparin ADULT infusion 100 units/mL (25000 units/274mL) (600 Units/hr Intravenous New Bag/Given 05/28/22 1846)  heparin bolus via infusion 2,500 Units (2,500 Units Intravenous Bolus from Bag 05/28/22 1847)  LORazepam (ATIVAN) injection 0.5 mg (0.5 mg Intravenous Given 05/28/22 1953)  ipratropium-albuterol (DUONEB) 0.5-2.5 (3) MG/3ML nebulizer solution (3 mLs  Given 05/28/22 1957)  albuterol (PROVENTIL) (2.5 MG/3ML) 0.083% nebulizer solution (2.5 mg  Given 05/28/22 1957)  sodium chloride 0.9 % bolus 1,000 mL (1,000 mLs Intravenous New Bag/Given 05/28/22 2200)    ED Course/ Medical Decision Making/ A&P Clinical Course as of 05/29/22 0009  Thu May 28, 2022  1826 Discussed case with hospitalist will be down to evaluate the patient put in orders for admission. [VB]    Clinical Course User Index [VB] Elgie Congo, MD                           Medical Decision Making  SHAWNEA VIRGA is a 71 y.o. female.  With PMH of CVA, HLD, HTN, CAD and prior MI, recent admission in August for acute hypoxic respiratory failure secondary to COVID-19 infection presenting with increasing dyspnea and left-sided chest pain.  Patient presents tachycardic, hypertensive 154/93, hypoxic to the mid 70s and tachypneic with increasing shortness of breath and left-sided pleuritic chest pain as well as endorsed orthopnea and dyspnea on exertion in the setting of recent admission for hypoxic respiratory failure secondary to COVID.  She has no history of COPD or CHF.  She has trace pitting edema of the lower extremities worse on the left lower extremity.  There were no significant B-lines on POCUS and no pericardial effusion witness.  Her chest x-ray was generally unremarkable with no consolidation concerning for pneumonia, no pulmonary edema, no pleural effusions.  Initial EKG sinus tachycardia with low voltage QRS similar to previous.  No acute changes, will trend troponins to evaluate for  NSTEMI.  Her initial pH was 7.2 with PO2 less than 31 on a VBG and PCO2 61 concerning for hypoxic hypercarbic respiratory failure.  Highest  concern would be for possible PE.  She is allergic to contrast media.  She is currently on BiPAP to maintain sats and oxygenation.  She will be unable to lay flat for CT at this time.  Would favor treating for possible PE with heparin and discussing with hospitalist.  Amount and/or Complexity of Data Reviewed Labs: ordered. Radiology: ordered.  Risk Prescription drug management. Decision regarding hospitalization.    Final Clinical Impression(s) / ED Diagnoses Final diagnoses:  Hypoxia  Anemia, unspecified type    Rx / DC Orders ED Discharge Orders     None         Elgie Congo, MD 05/29/22 0009

## 2022-05-28 NOTE — H&P (Signed)
History and Physical    [name] [id number] DOB: April 20, 1951 DOA: 05/28/2022  PCP: Carrolyn Meiers, MD   Patient coming from: Home  I have personally briefly reviewed patient's old medical records in [place]  Chief Complaint: Difficulty breathing  HPI: [name] is a 71 y.o. 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 Patient was brought to the ED reports of difficulty breathing.    Patient was recently admitted 8/12 to 8/19 for COVID-19 infection, with acute hypoxic respiratory failure.  Hypoxia resolved on discharge.  She also had AKI, acute colitis and was treated with Levaquin and Flagyl.  Patient tells me since she was discharged from the hospital she has been having difficulty breathing, but today her breathing significantly worsened.  She denies swelling to her lower extremities.   At time of my evaluation she is on BiPAP, she became severely dyspneic, tachycardic, tachypneic, tripoding.  She was given breathing treatments, 0.5 mg of Ativan.  With worsening ABG despite 3 hours on BiPAP, initial plan was to intubate patient, later patient calmed down, her dyspnea and vitals significantly improved.  Patient's denies COPD diagnosis, she is not on home O2 normally, and she quit smoking about 10 years ago.   ED Course: Tachycardic heart rate up to 153, improved transiently to 80s, currently 120s.  Blood pressure 123456 systolic to [id number]. O2 sats down to 77% on room air, patient was initially placed on nonrebreather and then switched to BiPAP due to increased work of breathing. VBG showed pH of 7.2.  Repeat ABG showed pH of 7.3.  O2 of < 31, there was concern it was a venous draw. D-dimer elevated at 3.97. Chest x-ray clear, showed hyperinflation of the lungs.  No evidence of acute pulmonary abnormality.  Review of Systems: As per HPI all other systems reviewed and  negative.  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 Colonic diverticulosis. Inadequate prepartation precluded examination of all the rectal and colonic.  I suspect slow colonic transit in the setting of a markely redundant colon.   CORONARY ANGIOPLASTY WITH STENT PLACEMENT  [id number]   cyst removed from left wrist     ERCP N/A 10/10/2021   Procedure: ENDOSCOPIC RETROGRADE CHOLANGIOPANCREATOGRAPHY (ERCP);  Surgeon: Rogene Houston, MD;  Location: [facility] ORS;  Service: Gastroenterology;  Laterality: N/A;   ERCP N/A 10/13/2021   Procedure: ENDOSCOPIC RETROGRADE CHOLANGIOPANCREATOGRAPHY (ERCP);  Surgeon: Rogene Houston, MD;  Location: [facility] ORS;  Service: Gastroenterology;  Laterality: N/A;   ESOPHAGEAL DILATION  05/08/2022   Procedure: ESOPHAGEAL DILATION;  Surgeon: Montez Morita,  Quillian Quince, MD;  Location: [facility] ENDO SUITE;  Service: Gastroenterology;;  savory dilation'   ESOPHAGOGASTRODUODENOSCOPY  09/05/2004   Dr Chucky May gastritis, candida esophagitis   ESOPHAGOGASTRODUODENOSCOPY  09/07/2011   [id number], esophageal in the distal esophagus/Mild gastritis   ESOPHAGOGASTRODUODENOSCOPY (EGD) WITH PROPOFOL N/A  05/08/2022   Procedure: ESOPHAGOGASTRODUODENOSCOPY (EGD) WITH PROPOFOL;  Surgeon: Harvel Quale, MD;  Location: [facility] ENDO SUITE;  Service: Gastroenterology;  Laterality: N/A;  with possible esophageal dilation   Lumps removed from each breast     SPHINCTEROTOMY N/A 10/10/2021   Procedure: SPHINCTEROTOMY INCOMPLETE, STONE NOT REMOVED;  Surgeon: Rogene Houston, MD;  Location: [facility] ORS;  Service: Gastroenterology;  Laterality: N/A;     reports that she quit smoking about 4 years ago. Her smoking use included cigarettes. She has a 10.00 pack-year smoking history. She has never used smokeless tobacco. She reports current drug use. Drug: Marijuana. She reports that she does not drink alcohol.  Allergies  Allergen Reactions   Penicillins Anaphylaxis   Aspirin Swelling and Other (See Comments)    Only in large doses will cause a reaction   Bee Venom Swelling    Severe swelling   Contrast Media [Iodinated Contrast Media] Swelling   Dilaudid [Hydromorphone Hcl] Itching   Lisinopril Swelling   Motrin [Ibuprofen] Swelling    Family History  Problem Relation Age of Onset   Multiple sclerosis Father    Alzheimer's disease Mother    Hypertension Mother    Diabetes Mother    Colon cancer Neg Hx     Prior to Admission medications   Medication Sig Start Date End Date Taking? Authorizing Provider  acetaminophen (TYLENOL) 325 MG tablet Take 2 tablets (650 mg total) by mouth every 6 (six) hours as needed for mild pain or headache (or Fever >/= 101). 05/09/22   Barton Dubois, MD  ascorbic acid (VITAMIN C) 500 MG tablet Take 1 tablet (500 mg total) by mouth daily. 05/10/22   Barton Dubois, MD  aspirin EC 81 MG tablet Take 81 mg by mouth daily.      [provider]  atorvastatin (LIPITOR) 20 MG tablet Take 1 tablet (20 mg total) by mouth daily. 05/10/22   Barton Dubois, MD  citalopram (CELEXA) 20 MG tablet Take 20 mg by mouth daily. 03/31/22   [provider]  cloNIDine  (CATAPRES) 0.1 MG tablet Take 3 tablets (0.3 mg total) by mouth 2 (two) times daily. 05/09/22   Barton Dubois, MD  dextromethorphan-guaiFENesin [facility] DM) 30-600 MG per 12 hr tablet Take 1 tablet by mouth 2 (two) times daily.    [provider]  docusate sodium (COLACE) 100 MG capsule Take 100 mg by mouth daily.      [provider]  fluconazole (DIFLUCAN) 150 MG tablet Take 1 tablet (150 mg total) by mouth daily for 22 days. Take 2 pills the first day, then continue one pill daily 05/13/22 06/04/22  Montez Morita, Quillian Quince, MD  folic acid (FOLVITE) 1 MG tablet Take 1 tablet (1 mg total) by mouth daily. 10/17/21   Orson Eva, MD  Ipratropium-Albuterol (COMBIVENT) 20-100 MCG/ACT AERS respimat Inhale 1 puff into the lungs every 6 (six) hours as needed for wheezing or shortness of breath. 05/09/22   Barton Dubois, MD  loratadine (CLARITIN) 10 MG tablet Take 10 mg by mouth daily. 03/12/22   [provider]  megestrol (MEGACE) 400 MG/10ML suspension Take 10 mLs (400 mg total) by mouth 2 (two) times daily. 05/09/22   Barton Dubois, MD  metoprolol tartrate (LOPRESSOR) 50 MG tablet Take 50 mg by mouth 2 (two) times daily. 08/10/21   [provider]  mirtazapine (REMERON) 7.5 MG tablet Take 7.5 mg by mouth at bedtime. 04/27/22   [provider]  pantoprazole (PROTONIX) 40 MG tablet Take 1 tablet (40 mg total) by mouth 2 (two) times daily. 05/09/22 05/09/23  Vassie Loll, MD  predniSONE (DELTASONE) 10 MG tablet Take 4 tablets by mouth daily x1 day; then 3 tablet by mouth daily x1 day; then 2 tablet by mouth daily x2 days; then 1 tablet by mouth daily x3 days and stop prednisone. 05/09/22   Vassie Loll, MD  vitamin B-12 (CYANOCOBALAMIN) 500 MCG tablet Take 1 tablet (500 mcg total) by mouth daily. 10/16/21   Catarina Hartshorn, MD  zinc sulfate 220 (50 Zn) MG capsule Take 1 capsule (220 mg total) by mouth daily. 05/10/22   Vassie Loll, MD    Physical Exam: Vitals:    05/28/22 2038 05/28/22 2103 05/28/22 2130 05/28/22 2200  BP:  (!) 117/58 105/71 114/61  Pulse: (!) 137 (!) 127 (!) 139 (!) 122  Resp: (!) 32 (!) 30 (!) 23 (!) 27  Temp:      TempSrc:      SpO2: 99% 100% 100% 100%  Weight:      Height:        Constitutional: NAD, calm, comfortable Vitals:   05/28/22 2038 05/28/22 2103 05/28/22 2130 05/28/22 2200  BP:  (!) 117/58 105/71 114/61  Pulse: (!) 137 (!) 127 (!) 139 (!) 122  Resp: (!) 32 (!) 30 (!) 23 (!) 27  Temp:      TempSrc:      SpO2: 99% 100% 100% 100%  Weight:      Height:       Eyes: PERRL, lids and conjunctivae normal ENMT: Mucous membranes are moist.   Neck: normal, supple, no masses, no thyromegaly Respiratory: On BiPAP, some reduced entry bilaterally, worse on the right, no added sounds, clear to auscultation bilaterally, no wheezing, no crackles.  Increased work of breathing, tripoding, use of accessory muscles this later resolved, patient was able to lie down. Cardiovascular: Tachycardic, regular rate and rhythm, no murmurs / rubs / gallops.  Trace bilateral pitting xtremity edema.  Abdomen: no tenderness, no masses palpated. No hepatosplenomegaly. Bowel sounds positive.  Musculoskeletal: no clubbing / cyanosis. No joint deformity upper and lower extremities. Good ROM, no contractures. Normal muscle tone.  Skin: no rashes, lesions, ulcers. No induration Neurologic: CN 2-12 grossly intact. Sensation intact, DTR normal. Strength 5/5 in all 4.  Psychiatric: Normal judgment and insight. Alert and oriented x 3. Normal mood.   Labs on Admission: I have personally reviewed following labs and imaging studies  CBC: Recent Labs  Lab 05/28/22 1640  WBC 6.3  NEUTROABS 3.7  HGB 8.6*  HCT 28.5*  MCV 115.4*  PLT 341   Basic Metabolic Panel: Recent Labs  Lab 05/28/22 1640  NA 137  K 4.3  CL 109  CO2 22  GLUCOSE 120*  BUN 17  CREATININE 1.08*  CALCIUM 8.6*   GFR: Estimated Creatinine Clearance: 29.7 mL/min (A) (by C-G  formula based on SCr of 1.08 mg/dL (H)). Liver Function Tests: Recent Labs  Lab 05/28/22 1640  AST 39  ALT 32  ALKPHOS 107  BILITOT 0.6  PROT 7.5  ALBUMIN 3.3*    Radiological Exams on Admission: DG Chest Portable 1 View  Result Date: 05/28/2022 CLINICAL DATA:  Shortness of breath for 2 weeks.  COVID positive. EXAM: PORTABLE CHEST 1 VIEW COMPARISON:  05/02/2022 FINDINGS: Lungs are hyperinflated. Significant bullous changes at the RIGHT lung apex, similar to prior. There are no focal consolidations or pleural effusions. No pulmonary edema. IMPRESSION: Hyperinflation of the lungs. No evidence for acute pulmonary abnormality. Electronically Signed   By: Norva Pavlov M.D.   On: 05/28/2022 17:12    EKG: Independently reviewed.  Sinus tachycardia, rate of 144.  QTc 421.  No significant change from prior.  Assessment/Plan Principal Problem:   Acute respiratory failure with hypoxia-Covid Related Active Problems:   CAD (coronary artery disease), native coronary artery   History of stroke    Assessment and Plan: * Acute respiratory failure with hypoxia-Covid Related O2 sats down to 77% on room air, currently on BiPAP.  Patient became severely dyspneic, respiratory rate up to 40s, tachycardic heart rate initially up to 153, with accessory muscle use.  While on BiPAP.  ABG showed pH of 7.2 > 7.1.  DuoNeb, and IV Ativan 0.5.  Patient dyspnea improved, ABG showed pH of 7.3. -Contrast allergy cannot obtain CTA -D-dimer elevated at 3.97, on recent hospitalization D-dimer 5.57 -ED provider was able to do bedside ultrasound,-did not think patient had pericardial effusion, or enlarged RV,  No suggestion of pulmonary edema. -  Initial plan to intubate the patient and admit to [facility].  Concern for large PE, saddle embolus, and if patient would need tPA.  I talked to critical care, Dr. Jayme Cloud, initially accepted patient in transfer, and will consider tPA on evaluation .   -Also based on  imaging, chest x-ray showing hyperinflation, patient may have undiagnosed emphysema - As patient was not intubated and clinically appears better, without lower extremity swelling to further support the diagnosis of PE, patient will be admitted [facility] at [facility] pending VQ scan in the morning. -1 L bolus, continue N/s 100cc/hr x 15hrs -DuoNebs as needed -Heparin drip  History of stroke Patient reports stroke was -started on 10/2001.  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  CAD (coronary artery disease), native coronary artery Denies chest pain, but here with acute hypoxic respiratory failure.  Troponin 37.  EKG without significant ST or T wave changes. -Trend  Essential hypertension In the setting of possible PE, hold off on clonidine 0.3 mg twice daily, metoprolol 50 mg twice daily.   DVT prophylaxis: Heparin Code Status: Full code-  Family Communication: None at bedside Disposition Plan: > 2 days Consults called: None Admission status: Inpt Step down I certify that at the point of admission it is my clinical judgment that the patient will require inpatient hospital care spanning beyond 2 midnights from the point of admission due to high intensity of service, high risk for further deterioration and high frequency of surveillance required.    Author: Onnie Boer, MD 05/28/2022 11:43 PM  For on call review [email or web address].

## 2022-05-29 ENCOUNTER — Other Ambulatory Visit (HOSPITAL_COMMUNITY): Payer: Self-pay | Admitting: *Deleted

## 2022-05-29 ENCOUNTER — Inpatient Hospital Stay (HOSPITAL_COMMUNITY): Payer: Medicare Other

## 2022-05-29 ENCOUNTER — Encounter (HOSPITAL_COMMUNITY): Payer: Self-pay | Admitting: Internal Medicine

## 2022-05-29 DIAGNOSIS — J9602 Acute respiratory failure with hypercapnia: Secondary | ICD-10-CM

## 2022-05-29 DIAGNOSIS — Z8616 Personal history of COVID-19: Secondary | ICD-10-CM

## 2022-05-29 DIAGNOSIS — N1831 Chronic kidney disease, stage 3a: Secondary | ICD-10-CM | POA: Diagnosis not present

## 2022-05-29 DIAGNOSIS — J441 Chronic obstructive pulmonary disease with (acute) exacerbation: Secondary | ICD-10-CM | POA: Diagnosis not present

## 2022-05-29 DIAGNOSIS — I340 Nonrheumatic mitral (valve) insufficiency: Secondary | ICD-10-CM

## 2022-05-29 DIAGNOSIS — D649 Anemia, unspecified: Secondary | ICD-10-CM | POA: Diagnosis not present

## 2022-05-29 DIAGNOSIS — D539 Nutritional anemia, unspecified: Secondary | ICD-10-CM

## 2022-05-29 DIAGNOSIS — R778 Other specified abnormalities of plasma proteins: Secondary | ICD-10-CM

## 2022-05-29 DIAGNOSIS — I2510 Atherosclerotic heart disease of native coronary artery without angina pectoris: Secondary | ICD-10-CM | POA: Diagnosis not present

## 2022-05-29 DIAGNOSIS — J9601 Acute respiratory failure with hypoxia: Secondary | ICD-10-CM | POA: Diagnosis not present

## 2022-05-29 LAB — MRSA NEXT GEN BY PCR, NASAL: MRSA by PCR Next Gen: DETECTED — AB

## 2022-05-29 LAB — TROPONIN I (HIGH SENSITIVITY)
Troponin I (High Sensitivity): 375 ng/L (ref <18)
Troponin I (High Sensitivity): 478 ng/L (ref <18)
Troponin I (High Sensitivity): 400 ng/L (ref <18)
Troponin I (High Sensitivity): 368 ng/L (ref <18)

## 2022-05-29 LAB — BASIC METABOLIC PANEL
Anion gap: 6 (ref 5–15)
BUN: 13 mg/dL (ref 8–23)
CO2: 21 mmol/L — ABNORMAL LOW (ref 22–32)
Calcium: 8.3 mg/dL — ABNORMAL LOW (ref 8.9–10.3)
Chloride: 114 mmol/L — ABNORMAL HIGH (ref 98–111)
Creatinine, Ser: 1.03 mg/dL — ABNORMAL HIGH (ref 0.44–1.00)
GFR, Estimated: 58 mL/min — ABNORMAL LOW (ref >60)
Glucose, Bld: 91 mg/dL (ref 70–99)
Potassium: 4.3 mmol/L (ref 3.5–5.1)
Sodium: 141 mmol/L (ref 135–145)

## 2022-05-29 LAB — CBC
HCT: 27.4 % — ABNORMAL LOW (ref 36.0–46.0)
Hemoglobin: 8.3 g/dL — ABNORMAL LOW (ref 12.0–15.0)
MCH: 34.4 pg — ABNORMAL HIGH (ref 26.0–34.0)
MCHC: 30.3 g/dL (ref 30.0–36.0)
MCV: 113.7 fL — ABNORMAL HIGH (ref 80.0–100.0)
Platelets: 303 10*3/uL (ref 150–400)
RBC: 2.41 MIL/uL — ABNORMAL LOW (ref 3.87–5.11)
RDW: 17.1 % — ABNORMAL HIGH (ref 11.5–15.5)
WBC: 5.6 10*3/uL (ref 4.0–10.5)
nRBC: 0 % (ref 0.0–0.2)

## 2022-05-29 LAB — ECHOCARDIOGRAM COMPLETE
Area-P 1/2: 7.16 cm2
Calc EF: 44.1 %
Height: 60 in
S' Lateral: 2.5 cm
Single Plane A2C EF: 41.2 %
Single Plane A4C EF: 42.3 %
Weight: 1410.94 oz

## 2022-05-29 LAB — RAPID URINE DRUG SCREEN, HOSP PERFORMED
Amphetamines: NOT DETECTED
Barbiturates: NOT DETECTED
Benzodiazepines: NOT DETECTED
Cocaine: NOT DETECTED
Opiates: POSITIVE — AB
Tetrahydrocannabinol: POSITIVE — AB

## 2022-05-29 LAB — HEPARIN LEVEL (UNFRACTIONATED)
Heparin Unfractionated: 0.25 IU/mL — ABNORMAL LOW (ref 0.30–0.70)
Heparin Unfractionated: 0.39 IU/mL (ref 0.30–0.70)

## 2022-05-29 LAB — TSH: TSH: 0.554 u[IU]/mL (ref 0.350–4.500)

## 2022-05-29 LAB — PROCALCITONIN: Procalcitonin: 0.64 ng/mL

## 2022-05-29 MED ORDER — ONDANSETRON HCL 4 MG/2ML IJ SOLN
4.0000 mg | Freq: Four times a day (QID) | INTRAMUSCULAR | Status: DC | PRN
  Administered 2022-06-07: 4 mg via INTRAVENOUS
  Filled 2022-05-29: qty 2

## 2022-05-29 MED ORDER — HEPARIN BOLUS VIA INFUSION
600.0000 [IU] | Freq: Once | INTRAVENOUS | Status: AC
  Administered 2022-05-29: 600 [IU] via INTRAVENOUS
  Filled 2022-05-29: qty 600

## 2022-05-29 MED ORDER — LORAZEPAM 2 MG/ML IJ SOLN
0.5000 mg | Freq: Once | INTRAMUSCULAR | Status: AC
  Administered 2022-05-29: 0.5 mg via INTRAVENOUS
  Filled 2022-05-29: qty 1

## 2022-05-29 MED ORDER — PROSOURCE PLUS PO LIQD
30.0000 mL | Freq: Three times a day (TID) | ORAL | Status: DC
  Filled 2022-05-29: qty 30

## 2022-05-29 MED ORDER — SODIUM CHLORIDE 0.9 % IV SOLN
500.0000 mg | INTRAVENOUS | Status: DC
  Administered 2022-05-29 – 2022-06-05 (×8): 500 mg via INTRAVENOUS
  Filled 2022-05-29 (×8): qty 5

## 2022-05-29 MED ORDER — IOHEXOL 350 MG/ML SOLN
75.0000 mL | Freq: Once | INTRAVENOUS | Status: AC | PRN
  Administered 2022-05-29: 60 mL via INTRAVENOUS

## 2022-05-29 MED ORDER — IPRATROPIUM-ALBUTEROL 20-100 MCG/ACT IN AERS: 1.0000 | INHALATION_SPRAY | Freq: Four times a day (QID) | RESPIRATORY_TRACT | Status: DC | PRN

## 2022-05-29 MED ORDER — SODIUM CHLORIDE 0.9 % IV SOLN: INTRAVENOUS | Status: DC

## 2022-05-29 MED ORDER — DIPHENHYDRAMINE HCL 25 MG PO CAPS: 50.0000 mg | ORAL_CAPSULE | Freq: Once | ORAL | Status: AC

## 2022-05-29 MED ORDER — VANCOMYCIN HCL IN DEXTROSE 1-5 GM/200ML-% IV SOLN
1000.0000 mg | Freq: Once | INTRAVENOUS | Status: AC
Start: 2022-05-29 — End: 2022-05-29
  Administered 2022-05-29: 1000 mg via INTRAVENOUS
  Filled 2022-05-29: qty 200

## 2022-05-29 MED ORDER — METOPROLOL TARTRATE 5 MG/5ML IV SOLN
5.0000 mg | Freq: Four times a day (QID) | INTRAVENOUS | Status: DC
  Administered 2022-05-29 – 2022-05-30 (×4): 5 mg via INTRAVENOUS
  Filled 2022-05-29 (×4): qty 5

## 2022-05-29 MED ORDER — ACETAMINOPHEN 325 MG PO TABS
650.0000 mg | ORAL_TABLET | Freq: Four times a day (QID) | ORAL | Status: DC | PRN
  Administered 2022-06-06 – 2022-06-14 (×4): 650 mg via ORAL
  Filled 2022-05-29 (×6): qty 2

## 2022-05-29 MED ORDER — ARFORMOTEROL TARTRATE 15 MCG/2ML IN NEBU
15.0000 ug | INHALATION_SOLUTION | Freq: Two times a day (BID) | RESPIRATORY_TRACT | Status: DC
  Administered 2022-05-29 – 2022-06-17 (×36): 15 ug via RESPIRATORY_TRACT
  Filled 2022-05-29 (×38): qty 2

## 2022-05-29 MED ORDER — PANTOPRAZOLE SODIUM 40 MG IV SOLR
40.0000 mg | Freq: Two times a day (BID) | INTRAVENOUS | Status: DC
  Administered 2022-05-29 – 2022-06-17 (×38): 40 mg via INTRAVENOUS
  Filled 2022-05-29 (×38): qty 10

## 2022-05-29 MED ORDER — DIPHENHYDRAMINE HCL 50 MG/ML IJ SOLN
50.0000 mg | Freq: Once | INTRAMUSCULAR | Status: AC
  Administered 2022-05-29: 50 mg via INTRAVENOUS
  Filled 2022-05-29: qty 1

## 2022-05-29 MED ORDER — ONDANSETRON HCL 4 MG PO TABS: 4.0000 mg | ORAL_TABLET | Freq: Four times a day (QID) | ORAL | Status: DC | PRN

## 2022-05-29 MED ORDER — SODIUM CHLORIDE 0.9 % IV SOLN
1.0000 g | Freq: Three times a day (TID) | INTRAVENOUS | Status: DC
  Administered 2022-05-29 – 2022-06-06 (×24): 1 g via INTRAVENOUS
  Filled 2022-05-29 (×12): qty 5
  Filled 2022-05-29: qty 1
  Filled 2022-05-29 (×15): qty 5

## 2022-05-29 MED ORDER — ALBUTEROL SULFATE (2.5 MG/3ML) 0.083% IN NEBU
2.5000 mg | INHALATION_SOLUTION | RESPIRATORY_TRACT | Status: DC | PRN
  Administered 2022-05-29 – 2022-05-30 (×3): 2.5 mg via RESPIRATORY_TRACT
  Filled 2022-05-29 (×4): qty 3

## 2022-05-29 MED ORDER — REVEFENACIN 175 MCG/3ML IN SOLN
175.0000 ug | Freq: Every day | RESPIRATORY_TRACT | Status: DC
  Administered 2022-05-29 – 2022-06-17 (×18): 175 ug via RESPIRATORY_TRACT
  Filled 2022-05-29 (×22): qty 3

## 2022-05-29 MED ORDER — METHYLPREDNISOLONE SODIUM SUCC 40 MG IJ SOLR: 40.0000 mg | Freq: Two times a day (BID) | INTRAMUSCULAR | Status: DC

## 2022-05-29 MED ORDER — MUPIROCIN 2 % EX OINT
TOPICAL_OINTMENT | Freq: Two times a day (BID) | CUTANEOUS | Status: DC
  Administered 2022-05-29 – 2022-06-01 (×2): 1 via NASAL
  Filled 2022-05-29 (×5): qty 22

## 2022-05-29 MED ORDER — ACETAMINOPHEN 650 MG RE SUPP: 650.0000 mg | Freq: Four times a day (QID) | RECTAL | Status: DC | PRN

## 2022-05-29 MED ORDER — POLYETHYLENE GLYCOL 3350 17 G PO PACK: 17.0000 g | PACK | Freq: Every day | ORAL | Status: DC | PRN

## 2022-05-29 MED ORDER — BOOST / RESOURCE BREEZE PO LIQD CUSTOM: 1.0000 | Freq: Three times a day (TID) | ORAL | Status: DC

## 2022-05-29 MED ORDER — BUDESONIDE 0.5 MG/2ML IN SUSP
0.5000 mg | Freq: Two times a day (BID) | RESPIRATORY_TRACT | Status: DC
  Administered 2022-05-29 – 2022-06-17 (×36): 0.5 mg via RESPIRATORY_TRACT
  Filled 2022-05-29 (×38): qty 2

## 2022-05-29 MED ORDER — MORPHINE SULFATE (PF) 2 MG/ML IV SOLN
1.0000 mg | INTRAVENOUS | Status: DC | PRN
  Administered 2022-05-29 – 2022-05-30 (×6): 1 mg via INTRAVENOUS
  Filled 2022-05-29 (×8): qty 1

## 2022-05-29 MED ORDER — METHYLPREDNISOLONE SODIUM SUCC 40 MG IJ SOLR
40.0000 mg | Freq: Two times a day (BID) | INTRAMUSCULAR | Status: DC
  Administered 2022-05-29 – 2022-06-05 (×15): 40 mg via INTRAVENOUS
  Filled 2022-05-29 (×15): qty 1

## 2022-05-29 MED ORDER — METHYLPREDNISOLONE SODIUM SUCC 125 MG IJ SOLR
125.0000 mg | Freq: Once | INTRAMUSCULAR | Status: AC
  Administered 2022-05-29: 125 mg via INTRAVENOUS
  Filled 2022-05-29: qty 2

## 2022-05-29 MED ORDER — CHLORHEXIDINE GLUCONATE CLOTH 2 % EX PADS
6.0000 | MEDICATED_PAD | Freq: Every day | CUTANEOUS | Status: DC
  Administered 2022-05-29 – 2022-06-17 (×19): 6 via TOPICAL

## 2022-05-29 MED ORDER — VANCOMYCIN HCL 750 MG/150ML IV SOLN
750.0000 mg | INTRAVENOUS | Status: DC
  Administered 2022-05-30 – 2022-06-05 (×7): 750 mg via INTRAVENOUS
  Filled 2022-05-29 (×7): qty 150

## 2022-05-29 MED ORDER — MORPHINE SULFATE (PF) 2 MG/ML IV SOLN
1.0000 mg | Freq: Once | INTRAVENOUS | Status: AC
  Administered 2022-05-29: 1 mg via INTRAVENOUS
  Filled 2022-05-29: qty 1

## 2022-05-29 NOTE — Progress Notes (Addendum)
Initial Nutrition Assessment  DOCUMENTATION CODES:   Non-severe (moderate) malnutrition in context of chronic illness  Underweight  INTERVENTION:  Boost Breeze po TID  Protein modular TID  Mango ice as desired   NUTRITION DIAGNOSIS:   Moderate Malnutrition related to chronic illness as evidenced by percent weight loss, mild fat depletion, mild muscle depletion.   GOAL:  Provide needs based on ASPEN/SCCM guidelines  MONITOR:  Diet advancement, Labs, Weight trends, Supplement acceptance, PO intake  REASON FOR ASSESSMENT:   Consult Assessment of nutrition requirement/status  ASSESSMENT: Patient is a 71 yo female with hx of CVA, blindness in right eye, GERD, CKD-3a, CAD, HLD, HTN. Recent COVID, AKI, colitis. Presents with shortness of breath and required BiPAP. COPD with acute exacerbation.   SLP assessed and followed patient on 8/14, 8/15-pt required dysphagia 2 diet and thin liquids. Patient on BiPAP currently. Difficult to discuss nutrition history. She is trying to talk but unable to hear her. Discussed patient with nursing.   Weight encounters reviewed- 16% wt loss since late January- which is significant. At risk for worsening nutrition status due to BiPAP requirement limiting ability to take oral nutrition.     Medications: methylprednisolone, lopressor, protonix.   IVF-NS@ 100 ml/hr     Latest Ref Rng & Units 05/29/2022    4:38 AM 05/28/2022    4:40 PM 05/08/2022    4:05 AM  BMP  Glucose 70 - 99 mg/dL 91  086  578   BUN 8 - 23 mg/dL 13  17  13    Creatinine 0.44 - 1.00 mg/dL  4.69  6.29   Sodium 135 - 145 mmol/L 141  137  142   Potassium 3.5 - 5.1 mmol/L 4.3  4.3  3.0   Chloride 98 - 111 mmol/L 114  109  111   CO2 22 - 32 mmol/L 21  22  26    Calcium 8.9 - 10.3 mg/dL 8.3  8.6  7.8       NUTRITION - FOCUSED PHYSICAL EXAM:  Flowsheet Row Most Recent Value  Orbital Region Unable to assess  Upper Arm Region Mild depletion  Thoracic and Lumbar Region Mild  depletion  Buccal Region Unable to assess  [due to bipap]  Temple Region No depletion  Clavicle Bone Region No depletion  Clavicle and Acromion Bone Region Mild depletion  Scapular Bone Region Unable to assess  Dorsal Hand Mild depletion  Patellar Region Moderate depletion  Anterior Thigh Region Moderate depletion  Edema (RD Assessment) None  Hair Reviewed  Eyes Reviewed  Mouth Unable to assess  Skin Reviewed  Nails Reviewed       Diet Order:   Diet Order             Diet clear liquid Room service appropriate? Yes; Fluid consistency: Thin  Diet effective now                   EDUCATION NEEDS:  Not appropriate for education at this time  Skin:  Skin Assessment: Reviewed RN Assessment  Last BM:  unknown  Height:   Ht Readings from Last 1 Encounters:  05/28/22 5' (1.524 m)    Weight:   Wt Readings from Last 1 Encounters:  05/28/22 40 kg    Ideal Body Weight:   45 kg  BMI:  Body mass index is 17.22 kg/m.  Estimated Nutritional Needs:   Kcal:  1500-1600  Protein:  68-72 gr  Fluid:  1200 ml daily  07/28/22 MS,RD,CSG,LDN Contact:AMION

## 2022-05-29 NOTE — Assessment & Plan Note (Signed)
Restart pantoprazole

## 2022-05-29 NOTE — Progress Notes (Signed)
EKG obtained. Placed in chart.

## 2022-05-29 NOTE — Progress Notes (Addendum)
ANTICOAGULATION CONSULT NOTE - Initial Consult  Pharmacy Consult:  Heparin Indication:  Rule out PE  Allergies  Allergen Reactions   Penicillins Anaphylaxis   Aspirin Swelling and Other (See Comments)    Only in large doses will cause a reaction   Bee Venom Swelling    Severe swelling   Contrast Media [Iodinated Contrast Media] Swelling   Dilaudid [Hydromorphone Hcl] Itching   Lisinopril Swelling   Motrin [Ibuprofen] Swelling    Patient Measurements: Height: 5' (152.4 cm) Weight: 40 kg (88 lb 2.9 oz) IBW/kg (Calculated) : 45.5 Heparin Dosing Weight: 40 kg  Vital Signs: Temp: 98.2 F (36.8 C) (09/08 0231) Temp Source: Axillary (09/08 0231) BP: 128/61 (09/08 0600) Pulse Rate: 113 (09/08 0600)  Labs: Recent Labs    05/28/22 1640 05/28/22 2259 05/29/22 0438  HGB 8.6*  --  8.3*  HCT 28.5*  --  27.4*  PLT 341  --  303  HEPARINUNFRC  --   --  0.25*  CREATININE 1.08*  --  1.03*  TROPONINIHS 13 375* 478*     Estimated Creatinine Clearance: 31.2 mL/min (A) (by C-G formula based on SCr of 1.03 mg/dL (H)).   Medical History: Past Medical History:  Diagnosis Date   Anxiety    Blind    right   Colitis 09/07/2011   CVA (cerebral infarction)    GERD (gastroesophageal reflux disease)    Hyperlipidemia    Hypertension    Myocardial infarct (HCC)    1997, 2004   Severe major depression with psychotic features (HCC)    2004   Stroke [facility]) 2002    Assessment: 22 YOF presented with SOB in setting of recent Covid.  Pharmacy consulted to dose IV heparin for rule out PE.  Baseline labs reviewed.  05/29/2022 AM labs HL 0.25 subtherapeutic There have been no issues with the infusion or signs of bleeding according to the nurse providing care.   Goal of Therapy:  Heparin level 0.3-0.7 units/ml Monitor platelets by anticoagulation protocol: Yes   Plan:  Heparin 600 units IV bolus, then Increase heparin gtt to 700 units/hr Check 8 hr heparin level Daily heparin level  and CBC  Vennie Salsbury BS, PharmD, BCPS Clinical Pharmacist 05/29/2022 6:14 AM  Contact: [phone] after 3 PM  "Be curious, not judgmental..." -Debbora Dus

## 2022-05-29 NOTE — Progress Notes (Signed)
MRSA + in nares. Mupirocin ordered per protocol.

## 2022-05-29 NOTE — Progress Notes (Signed)
PROGRESS NOTE  [name] [id number] DOB: 1951-07-16 DOA: 05/28/2022 PCP: Carrolyn Meiers, MD  Brief History:  71 year old female with a history of coronary disease, hypertension, hyperlipidemia, functional constipation, B12 and folate deficiency, anxiety, and prior stroke presenting with shortness of breath and respiratory failure.  The patient was recently mid to the hospital from 05/02/2022 to 05/09/2022 when she had acute respiratory failure secondary to COVID-19.  During that hospitalization, the patient was also noted to have colitis for which she was treated with levofloxacin and metronidazole.  She completed a course of antibiotics prior to discharge.  She was discharged home with a prednisone taper. She states that she has had some shortness of breath since discharge from the hospital, but this has significantly worsened in the 24 hours prior to admission.  Upon arrival to the emergency department, the patient was noted to have respiratory distress patient was placed on BiPAP. Repeat ABG on BiPAP did not show significant improvement.  There was plans for intubation, but the patient was given Ativan with improvement clinically.  She remained on BiPAP overnight.  Notably, the patient states that she started having some burning chest discomfort on the morning of 05/28/2022.  She states that it somewhat improved while she was on BiPAP, but continues to have the chest discomfort.  She was taken off of BiPAP in the morning of 05/29/2022 and stated that she had some worsening of her burning chest discomfort.  She developed some increased shortness of breath resulting in placement back on BiPAP on the morning of 05/29/22.  Troponins were noted to be 375>> 478.  D-dimer was also noted to be elevated 3.97.  The patient was started on IV heparin. The patient was given Solu-Medrol 125 mg IV, morphine 1 mg IV and placed back on BiPAP.  Repeat EKG showed sinus tachycardia with T wave  inversion V2-V5.  Troponins continue to be cycled. cardiology was consulted to assist with management.      Assessment and Plan: * Acute respiratory failure with hypoxia and hypercarbia (HCC) - Presented with respiratory distress with oxygen saturation 77% on room air -Placed back on BiPAP 05/29/22 am -In part due to COPD exacerbation -Wean off BiPAP as tolerated -VQ scan is pending -PCT 0.64, BNP 72  COPD with acute exacerbation ([place]) Although the patient denies a history of COPD and has not had formal PFTs--> she endorses smoking over 30 years, 1/2 pack/day -Quit smoking 7 years ago -Start DuoNebs -Start IV Solu-Medrol -Start Pulmicort  Elevated troponin Concerned about ACS -Continue IV heparin -Cardiology consult -Patient does complain of chest burning -Personally reviewed EKG--sinus rhythm, T wave depression V2-V5 -Repeat echo  Chronic kidney disease, stage 3a ([place]) Baseline creatinine 0.8-1.1  Gastroesophageal reflux disease Restart pantoprazole  History of stroke Patient reports stroke was -started on 10/2001.  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  CAD (coronary artery disease), native coronary artery Denies chest pain, but here with acute hypoxic respiratory failure.  Troponin 37.  EKG without significant ST or T wave changes. -Trend  Essential hypertension Restart metoprolol--give as IV while patient is on BiPAP -       Family Communication:   no Family at bedside  Consultants:  cardiology  Code Status:  FULL   DVT Prophylaxis:  IV Heparin   Procedures: As Listed in Progress Note Above  Antibiotics: None      Subjective: Patient complains of chest burning and  shortness of breath.  She denies any nausea vomiting.  She has some upper abdominal pain.  She denies any diarrhea, hematochezia, melena, hemoptysis.  Objective: Vitals:   05/29/22 0231 05/29/22 0500 05/29/22 0600 05/29/22 0752  BP:  115/65 128/61   Pulse: (!) 106 (!) 111  (!) 113   Resp: 18 (!) 24 17   Temp: 98.2 F (36.8 C)     TempSrc: Axillary     SpO2: 100% 100% 100% 100%  Weight:      Height:        Intake/Output Summary (Last 24 hours) at 05/29/2022 7425 Last data filed at 05/29/2022 0231 Gross per 24 hour  Intake 1078.75 ml  Output --  Net 1078.75 ml   Weight change:  Exam:  General:  Pt is alert, follows commands appropriately, not in acute distress HEENT: No icterus, No thrush, No neck mass, Regino Ramirez/AT Cardiovascular: RRR, S1/S2, no rubs, no gallops Respiratory: Diminished breath sounds.  Bibasilar rales. Abdomen: Soft/+BS, non tender, non distended, no guarding Extremities: trace LE edema, No lymphangitis, No petechiae, No rashes, no synovitis   Data Reviewed: I have personally reviewed following labs and imaging studies Basic Metabolic Panel: Recent Labs  Lab 05/28/22 1640 05/29/22 0438  NA 137 141  K 4.3 4.3  CL 109 114*  CO2 22 21*  GLUCOSE 120* 91  BUN 17 13  CREATININE 1.08* 1.03*  CALCIUM 8.6* 8.3*   Liver Function Tests: Recent Labs  Lab 05/28/22 1640  AST 39  ALT 32  ALKPHOS 107  BILITOT 0.6  PROT 7.5  ALBUMIN 3.3*   No results for input(s): "LIPASE", "AMYLASE" in the last 168 hours. No results for input(s): "AMMONIA" in the last 168 hours. Coagulation Profile: No results for input(s): "INR", "PROTIME" in the last 168 hours. CBC: Recent Labs  Lab 05/28/22 1640 05/29/22 0438  WBC 6.3 5.6  NEUTROABS 3.7  --   HGB 8.6* 8.3*  HCT 28.5* 27.4*  MCV 115.4* 113.7*  PLT 341 303   Cardiac Enzymes: No results for input(s): "CKTOTAL", "CKMB", "CKMBINDEX", "TROPONINI" in the last 168 hours. BNP: Invalid input(s): "POCBNP" CBG: No results for input(s): "GLUCAP" in the last 168 hours. HbA1C: No results for input(s): "HGBA1C" in the last 72 hours. Urine analysis:    Component Value Date/Time   COLORURINE YELLOW 05/02/2022 0711   APPEARANCEUR HAZY (A) 05/02/2022 0711   LABSPEC 1.014 05/02/2022 0711   PHURINE  6.0 05/02/2022 0711   GLUCOSEU NEGATIVE 05/02/2022 0711   HGBUR SMALL (A) 05/02/2022 0711   BILIRUBINUR NEGATIVE 05/02/2022 0711   KETONESUR NEGATIVE 05/02/2022 0711   PROTEINUR >=300 (A) 05/02/2022 0711   UROBILINOGEN 1.0 07/16/2014 0244   NITRITE NEGATIVE 05/02/2022 0711   LEUKOCYTESUR NEGATIVE 05/02/2022 0711   Sepsis Labs: @LABRCNTIP (procalcitonin:4,lacticidven:4) ) Recent Results (from the past 240 hour(s))  Resp Panel by RT-PCR (Flu A&B, Covid) Anterior Nasal Swab     Status: None   Collection Time: 05/28/22  4:27 PM   Specimen: Anterior Nasal Swab  Result Value Ref Range Status   SARS Coronavirus 2 by RT PCR NEGATIVE NEGATIVE Final    Comment: (NOTE) SARS-CoV-2 target nucleic acids are NOT DETECTED.  The SARS-CoV-2 RNA is generally detectable in upper respiratory specimens during the acute phase of infection. The lowest concentration of SARS-CoV-2 viral copies this assay can detect is 138 copies/mL. A negative result does not preclude SARS-Cov-2 infection and should not be used as the sole basis for treatment or other patient management decisions. A negative  result may occur with  improper specimen collection/handling, submission of specimen other than nasopharyngeal swab, presence of viral mutation(s) within the areas targeted by this assay, and inadequate number of viral copies(<138 copies/mL). A negative result must be combined with clinical observations, patient history, and epidemiological information. The expected result is Negative.  Fact Sheet for Patients:  BloggerCourse.com  Fact Sheet for Healthcare Providers:  SeriousBroker.it  This test is no t yet approved or cleared by the [place] FDA and  has been authorized for detection and/or diagnosis of SARS-CoV-2 by FDA under an Emergency Use Authorization (EUA). This EUA will remain  in effect (meaning this test can be used) for the duration of  the COVID-19 declaration under Section 564(b)(1) of the Act, 21 U.S.C.section 360bbb-3(b)(1), unless the authorization is terminated  or revoked sooner.       Influenza A by PCR NEGATIVE NEGATIVE Final   Influenza B by PCR NEGATIVE NEGATIVE Final    Comment: (NOTE) The Xpert Xpress SARS-CoV-2/FLU/RSV plus assay is intended as an aid in the diagnosis of influenza from Nasopharyngeal swab specimens and should not be used as a sole basis for treatment. Nasal washings and aspirates are unacceptable for Xpert Xpress SARS-CoV-2/FLU/RSV testing.  Fact Sheet for Patients: BloggerCourse.com  Fact Sheet for Healthcare Providers: SeriousBroker.it  This test is not yet approved or cleared by the [place] FDA and has been authorized for detection and/or diagnosis of SARS-CoV-2 by FDA under an Emergency Use Authorization (EUA). This EUA will remain in effect (meaning this test can be used) for the duration of the COVID-19 declaration under Section 564(b)(1) of the Act, 21 U.S.C. section 360bbb-3(b)(1), unless the authorization is terminated or revoked.  Performed at [facility], [address], [place], [place] [address]      Scheduled Meds:  Chlorhexidine Gluconate Cloth  6 each Topical Q0600   metoprolol tartrate  5 mg Intravenous Q6H   pantoprazole (PROTONIX) IV  40 mg Intravenous Q12H   Continuous Infusions:  sodium chloride 100 mL/hr at 05/29/22 0231   heparin 700 Units/hr (05/29/22 0627)    Procedures/Studies: DG Chest Portable 1 View  Result Date: 05/28/2022 CLINICAL DATA:  Shortness of breath for 2 weeks.  COVID positive. EXAM: PORTABLE CHEST 1 VIEW COMPARISON:  05/02/2022 FINDINGS: Lungs are hyperinflated. Significant bullous changes at the RIGHT lung apex, similar to prior. There are no focal consolidations or pleural effusions. No pulmonary edema. IMPRESSION: Hyperinflation of the lungs. No evidence for acute pulmonary  abnormality. Electronically Signed   By: Norva Pavlov M.D.   On: 05/28/2022 17:12   DG Chest 2 View  Result Date: 05/02/2022 CLINICAL DATA:  71 year old female with history of abdominal pain, vomiting and diarrhea. EXAM: CHEST - 2 VIEW COMPARISON:  Chest x-ray 10/09/2021. FINDINGS: Lung volumes are increased with diffuse emphysematous changes. No consolidative airspace disease. No pleural effusions. No pneumothorax. No pulmonary nodule or mass noted. Pulmonary vasculature and the cardiomediastinal silhouette are within normal limits. Atherosclerosis in the thoracic aorta. IMPRESSION: 1. No radiographic evidence of acute cardiopulmonary disease. 2. Emphysema. 3. Aortic atherosclerosis. Electronically Signed   By: Trudie Reed M.D.   On: 05/02/2022 08:51   CT ABDOMEN PELVIS WO CONTRAST  Result Date: 05/02/2022 CLINICAL DATA:  71 year old female with history of acute onset of nonlocalized abdominal pain, vomiting and diarrhea for the past 3 days. EXAM: CT ABDOMEN AND PELVIS WITHOUT CONTRAST TECHNIQUE: Multidetector CT imaging of the abdomen and pelvis was performed following the standard protocol without IV contrast. RADIATION  DOSE REDUCTION: This exam was performed according to the departmental dose-optimization program which includes automated exposure control, adjustment of the mA and/or kV according to patient size and/or use of iterative reconstruction technique. COMPARISON:  CT of the abdomen and pelvis 10/09/2021. FINDINGS: Lower chest: Diffuse bronchial wall thickening and emphysematous changes in the lung bases bilaterally. Small right-sided Bochdalek's hernia. Scarring in the lung bases bilaterally. Atherosclerotic calcifications in the descending thoracic aorta as well as the left anterior descending, left circumflex and right coronary arteries. Hepatobiliary: There are low-attenuation lesions in the liver, incompletely characterized on today's noncontrast CT examination, previously  characterized as simple cysts on prior abdominal MRI 10/14/2021 (no imaging follow-up is recommended), measuring up to 1.3 cm in segment 4A. No other suspicious appearing hepatic lesions are noted. Pancreas: No definite pancreatic mass or peripancreatic fluid collections or inflammatory changes are noted on today's noncontrast CT examination. Spleen: Unremarkable. Adrenals/Urinary Tract: Large low-attenuation lesion measuring 5.4 cm in the upper pole of the left kidney, and smaller 1.2 cm low-attenuation lesion in the interpolar region of the left kidney, incompletely characterized on today's noncontrast CT examination, but previously characterized as simple cysts on MRI 10/14/2021 (no imaging follow-up is recommended). Right kidney and bilateral adrenal glands are unremarkable in appearance. No hydroureteronephrosis. Urinary bladder is unremarkable in appearance. Stomach/Bowel: The appearance of the stomach is normal. There is no pathologic dilatation of small bowel or colon. There is profound mural thickening and extensive surrounding inflammatory changes associated with the mid and distal transverse colon, concerning for severe acute colitis. The appendix is not confidently identified and may be surgically absent. Regardless, there are no inflammatory changes noted adjacent to the cecum to suggest the presence of an acute appendicitis at this time. Vascular/Lymphatic: Atherosclerotic calcifications throughout the abdominal aorta and pelvic vasculature. No definite lymphadenopathy confidently identified in the abdomen or pelvis on today's noncontrast CT examination. Reproductive: Status post hysterectomy. Ovaries are not confidently identified may be surgically absent or atrophic. Other: No significant volume of ascites.  No pneumoperitoneum. Musculoskeletal: There are no aggressive appearing lytic or blastic lesions noted in the visualized portions of the skeleton. IMPRESSION: 1. Findings suggest severe acute  colitis, predominantly involving the mid to distal transverse colon. 2. Aortic atherosclerosis, in addition to at least three-vessel coronary artery disease. Assessment for potential risk factor modification, dietary therapy or pharmacologic therapy may be warranted, if clinically indicated. 3. Emphysema. 4. Additional incidental findings, as above. Electronically Signed   By: Vinnie Langton M.D.   On: 05/02/2022 08:50    Orson Eva, DO  [facility]  If 7PM-7AM, please contact night-coverage [email or web address] Password [facility] 05/29/2022, 8:33 AM   LOS: 1 day

## 2022-05-29 NOTE — Progress Notes (Addendum)
Pharmacy Antibiotic Note  [name] a 71 y.o. female admitted on 05/29/2022 with pneumonia.  Pharmacy has been consulted for vancomycin dosing.  Plan: Vancomycin 750mg  IV every 24 hours.  Goal trough 15-20 mcg/mL.  Medical History: Past Medical History:  Diagnosis Date   Anxiety    Blind    right   Colitis 09/07/2011   CVA (cerebral infarction)    GERD (gastroesophageal reflux disease)    Hyperlipidemia    Hypertension    Myocardial infarct (HCC)    1997, 2004   Severe major depression with psychotic features (HCC)    2004   Stroke [facility]) 2002    Allergies:  Allergies  Allergen Reactions   Penicillins Anaphylaxis   Aspirin Swelling and Other (See Comments)    Only in large doses will cause a reaction   Bee Venom Swelling    Severe swelling   Contrast Media [Iodinated Contrast Media] Swelling   Dilaudid [Hydromorphone Hcl] Itching   Lisinopril Swelling   Motrin [Ibuprofen] Swelling    Filed Weights   05/28/22 1619  Weight: 40 kg (88 lb 2.9 oz)       Latest Ref Rng & Units 05/29/2022    4:38 AM 05/28/2022    4:40 PM 05/05/2022    4:20 AM  CBC  WBC 4.0 - 10.5 K/uL 5.6  6.3  21.2   Hemoglobin 12.0 - 15.0 g/dL 8.3  8.6  05/07/2022   Hematocrit 36.0 - 46.0 % 27.4  28.5  35.0   Platelets 150 - 400 K/uL 303  341  182      Estimated Creatinine Clearance: 31.2 mL/min (A) (by C-G formula based on SCr of 1.03 mg/dL (H)).  Antibiotics Given (last 72 hours)     None       Antimicrobials this admission:  vancomycin 05/29/2022  >>  Aztreonam 9/7 >> Azithromycin >>  Microbiology results: 05/29/2022  BCx: sent 05/29/2022  UCx: sent 05/29/2022  Resp Panel: sent  05/29/2022  MRSA PCR: positive   Thank you for allowing pharmacy to be a part of this patient's care.  07/29/2022, PharmD Clinical Pharmacist

## 2022-05-29 NOTE — Progress Notes (Signed)
*  PRELIMINARY RESULTS* Echocardiogram 2D Echocardiogram has been performed. Somewhat difficult echo due to patient on BiPAP and moving A Lot.  Stacey Drain 05/29/2022, 12:29 PM

## 2022-05-29 NOTE — Assessment & Plan Note (Addendum)
-  Although the patient denies a history of COPD and has not had formal PFTs--> she endorses smoking over 30 years, 1/2 pack/day. -Quit smoking 7 years ago -Continue Yupelri/Brovana -Continue IV Solu-Medrol; statin tapering off process -Continue Pulmicort and pulmonary toiletry.

## 2022-05-29 NOTE — Hospital Course (Addendum)
71 year old female with a history of coronary disease, hypertension, hyperlipidemia, functional constipation, B12 and folate deficiency, anxiety, and prior stroke presenting with shortness of breath and respiratory failure.  The patient was recently mid to the hospital from 05/02/2022 to 05/09/2022 when she had acute respiratory failure secondary to COVID-19.  During that hospitalization, the patient was also noted to have colitis for which she was treated with levofloxacin and metronidazole.  She completed a course of antibiotics prior to discharge.  She was discharged home with a prednisone taper. She states that she has had some shortness of breath since discharge from the hospital, but this has significantly worsened in the 24 hours prior to admission.  Upon arrival to the emergency department, the patient was noted to have respiratory distress patient was placed on BiPAP. Repeat ABG on BiPAP did not show significant improvement.  There was plans for intubation, but the patient was given Ativan with improvement clinically.  She remained on BiPAP overnight.  Notably, the patient states that she started having some burning chest discomfort on the morning of 05/28/2022.  She states that it somewhat improved while she was on BiPAP, but continues to have the chest discomfort.  She was taken off of BiPAP in the morning of 05/29/2022 and stated that she had some worsening of her burning chest discomfort.  She developed some increased shortness of breath resulting in placement back on BiPAP on the morning of 05/29/22.  Troponins were noted to be 375>> 478.  D-dimer was also noted to be elevated 3.97.  The patient was started on IV heparin. The patient was given Solu-Medrol 125 mg IV, morphine 1 mg IV and placed back on BiPAP.  Repeat EKG showed sinus tachycardia with T wave inversion V2-V5.  Troponins continue to be cycled. cardiology was consulted to assist with management.   On the evening of 05/30/2022, the patient  developed respiratory distress and hypoxia on BiPAP.  She remained agitated despite pharmacologic and nonpharmacologic interventions.  After discussion with the patient's family, decision was made to intubate the patient.  Repeat limited echocardiogram was ordered for 05/31/22.

## 2022-05-29 NOTE — Care Management Important Message (Signed)
Important Message  Patient Details  Name: [name] MRN: [id number] Date of Birth: 21-Apr-1951   Medicare Important Message Given:  Yes     Corey Harold 05/29/2022, 2:17 PM

## 2022-05-29 NOTE — Consult Note (Addendum)
NAME:  [name], MRN:  [id number], DOB:  Oct 13, 1950, LOS: 1 ADMISSION DATE:  05/28/2022, CONSULTATION DATE:  05/29/2022 REFERRING MD:  Dr. Arbutus Leas, [facility], CHIEF COMPLAINT:  Dyspnea   History of Present Illness:  71 yo female former smoker presented to [facility] ER with dyspnea.  SpO2 77% on room air.  She was in hospital from 8/12 to 8/19 with COVID 19 infection with AKI and colitis, but didn't need supplemental oxygen at discharge.  Her breathing has gotten progressive worse since hospital discharge.  Her chest xray showed hyperinflation with bullous changes.  She was started on supplemental oxygen and Bipap, and started on therapy for possible COPD exacerbation.  PCCM asked to assist with respiratory management in ICU.  Pertinent  Medical History  Anxiety, Blindness Rt eye, CVA, GERD, HLD, HTN, Depression, CAD s/p stent, CKD 3a  Significant Hospital Events: Including procedures, antibiotic start and stop dates in addition to other pertinent events   9/07 Admit, start on Bipap 9/08 Cardiology consulted for elevated troponin, start heparin gtt  Studies:  Echo 9/08 >>  CT angio chest 9/08 >> centrilobular/paraseptal emphysema with bullous emphysema RUL, patchy infiltrate RUL/RML/RLL/LLL, small Rt effusion  Interim History / Subjective:  She hasn't been able to stay off bipap for very long.  Objective   BP 133/81   Pulse (!) 104   Temp 97.8 F (36.6 C) (Oral)   Resp (!) 26   Ht 5' (1.524 m)   Wt 40 kg   SpO2 100%   BMI 17.22 kg/m   FiO2 (%):  [50 %] 50 %   Intake/Output Summary (Last 24 hours) at 05/29/2022 1128 Last data filed at 05/29/2022 1036 Gross per 24 hour  Intake 1883.6 ml  Output --  Net 1883.6 ml   Filed Weights   05/28/22 1619  Weight: 40 kg    Examination:  General - alert, thin, using some accessory muscles Eyes - pupils reactive ENT - Bipap mask on Cardiac - regular rate/rhythm, no murmur Chest - decreased breath sounds b/l, no wheezing or rales Abdomen -  soft, non tender, + bowel sounds Extremities - no cyanosis, clubbing, or edema Skin - no rashes Neuro - normal strength, moves extremities, follows commands Psych - normal mood and behavior   Resolved Hospital Problem list     Assessment & Plan:   Acute hypoxic/hypercapnic respiratory failure from COPD exacerbation and pneumonia after recent COVID 19 infection in setting of bullous emphysema. - Abx day 1, currently on zithromax and azactam - add solumedrol 40 mg bid - add yupelri, brovana, pulmicort - prn alubterol - adjust oxygen to keep SpO2 90 to 95% - Bipap prn - monitor need for intubation in ICU - morphine 1 mg q4h prn for dyspnea  Elevated troponin from demand ischemia. Hx of CAD, HTN. - cardiology consulted - f/u Echo  CKD 3a. - monitor urine outpt  Macrocytic anemia. - f/u CBC  Pulmonary cachexia. - will ask dietician to assess nutritional needs  Best Practice (right click and "Reselect all SmartList Selections" daily)   Diet/type: clear liquids DVT prophylaxis: systemic heparin GI prophylaxis: PPI Lines: N/A Foley:  N/A Code Status:  full code Last date of multidisciplinary goals of care discussion [x]   Labs   CBC: Recent Labs  Lab 05/28/22 1640 05/29/22 0438  WBC 6.3 5.6  NEUTROABS 3.7  --   HGB 8.6* 8.3*  HCT 28.5* 27.4*  MCV 115.4* 113.7*  PLT 341 303    Basic Metabolic Panel: Recent  Labs  Lab 05/28/22 1640 05/29/22 0438  NA 137 141  K 4.3 4.3  CL 109 114*  CO2 22 21*  GLUCOSE 120* 91  BUN 17 13  CREATININE 1.08* 1.03*  CALCIUM 8.6* 8.3*   GFR: Estimated Creatinine Clearance: 31.2 mL/min (A) (by C-G formula based on SCr of 1.03 mg/dL (H)). Recent Labs  Lab 05/28/22 1640 05/29/22 0438  PROCALCITON  --  0.64  WBC 6.3 5.6    Liver Function Tests: Recent Labs  Lab 05/28/22 1640  AST 39  ALT 32  ALKPHOS 107  BILITOT 0.6  PROT 7.5  ALBUMIN 3.3*   No results for input(s): "LIPASE", "AMYLASE" in the last 168  hours. No results for input(s): "AMMONIA" in the last 168 hours.  ABG    Component Value Date/Time   PHART 7.36 05/28/2022 2137   PCO2ART 38 05/28/2022 2137   PO2ART 193 (H) 05/28/2022 2137   HCO3 21.5 05/28/2022 2137   ACIDBASEDEF 3.8 (H) 05/28/2022 2137   O2SAT 99.6 05/28/2022 2137     Coagulation Profile: No results for input(s): "INR", "PROTIME" in the last 168 hours.  Cardiac Enzymes: No results for input(s): "CKTOTAL", "CKMB", "CKMBINDEX", "TROPONINI" in the last 168 hours.  HbA1C: Hgb A1c MFr Bld  Date/Time Value Ref Range Status  05/07/2022 04:58 PM 5.9 (H) 4.8 - 5.6 % Final    Comment:    (NOTE) Pre diabetes:          5.7%-6.4%  Diabetes:              >6.4%  Glycemic control for   <7.0% adults with diabetes   09/07/2011 04:40 AM 6.0 (H) <5.7 % Final    Comment:    (NOTE)                                                                       According to the ADA Clinical Practice Recommendations for 2011, when HbA1c is used as a screening test:  >=6.5%   Diagnostic of Diabetes Mellitus           (if abnormal result is confirmed) 5.7-6.4%   Increased risk of developing Diabetes Mellitus References:Diagnosis and Classification of Diabetes Mellitus,Diabetes Care,2011,34(Suppl 1):S62-S69 and Standards of Medical Care in         Diabetes - 2011,Diabetes Care,2011,34 (Suppl 1):S11-S61.    CBG: No results for input(s): "GLUCAP" in the last 168 hours.  Review of Systems:   Reviewed and negative  Past Medical History:  She,  has a past medical history of Anxiety, Blind, Colitis (09/07/2011), CVA (cerebral infarction), GERD (gastroesophageal reflux disease), Hyperlipidemia, Hypertension, Myocardial infarct (HCC), Severe major depression with psychotic features (HCC), and Stroke (HCC) (2002).   Surgical History:   Past Surgical History:  Procedure Laterality Date   ABDOMINAL HYSTERECTOMY     BILATERAL SALPINGOOPHORECTOMY     BIOPSY  05/08/2022   Procedure:  BIOPSY;  Surgeon: Dolores Frame, MD;  Location: [facility];  Service: Gastroenterology;;   CHOLECYSTECTOMY N/A 10/15/2021   Procedure: LAPAROSCOPIC CHOLECYSTECTOMY WITH INTRAOPERATIVE CHOLANGIOGRAM;  Surgeon: Franky Macho, MD;  Location: [facility];  Service: General;  Laterality: N/A;   COLONOSCOPY N/A 07/17/2014   RMR: Extremely redundant colon. Colonic diverticulosis. Inadequate prepartation precluded examination  of all the rectal and colonic.  I suspect slow colonic transit in the setting of a markely redundant colon.   CORONARY ANGIOPLASTY WITH STENT PLACEMENT  [id number]   cyst removed from left wrist     ERCP N/A 10/10/2021   Procedure: ENDOSCOPIC RETROGRADE CHOLANGIOPANCREATOGRAPHY (ERCP);  Surgeon: Rogene Houston, MD;  Location: [facility];  Service: Gastroenterology;  Laterality: N/A;   ERCP N/A 10/13/2021   Procedure: ENDOSCOPIC RETROGRADE CHOLANGIOPANCREATOGRAPHY (ERCP);  Surgeon: Rogene Houston, MD;  Location: [facility];  Service: Gastroenterology;  Laterality: N/A;   ESOPHAGEAL DILATION  05/08/2022   Procedure: ESOPHAGEAL DILATION;  Surgeon: Montez Morita, Quillian Quince, MD;  Location: [facility];  Service: Gastroenterology;;  savory dilation'   ESOPHAGOGASTRODUODENOSCOPY  09/05/2004   Dr Chucky May gastritis, candida esophagitis   ESOPHAGOGASTRODUODENOSCOPY  09/07/2011   [id number], esophageal in the distal esophagus/Mild gastritis   ESOPHAGOGASTRODUODENOSCOPY (EGD) WITH PROPOFOL N/A 05/08/2022   Procedure: ESOPHAGOGASTRODUODENOSCOPY (EGD) WITH PROPOFOL;  Surgeon: Harvel Quale, MD;  Location: [facility];  Service: Gastroenterology;  Laterality: N/A;  with possible esophageal dilation   Lumps removed from each breast     SPHINCTEROTOMY N/A 10/10/2021   Procedure: SPHINCTEROTOMY INCOMPLETE, STONE NOT REMOVED;  Surgeon: Rogene Houston, MD;  Location: [facility];  Service: Gastroenterology;  Laterality: N/A;     Social History:   reports that she quit smoking  about 4 years ago. Her smoking use included cigarettes. She has a 10.00 pack-year smoking history. She has never used smokeless tobacco. She reports current drug use. Drug: Marijuana. She reports that she does not drink alcohol.   Family History:  Her family history includes Alzheimer's disease in her mother; Diabetes in her mother; Hypertension in her mother; Multiple sclerosis in her father. There is no history of Colon cancer.   Allergies Allergies  Allergen Reactions   Penicillins Anaphylaxis   Aspirin Swelling and Other (See Comments)    Only in large doses will cause a reaction   Bee Venom Swelling    Severe swelling   Contrast Media [Iodinated Contrast Media] Swelling   Dilaudid [Hydromorphone Hcl] Itching   Lisinopril Swelling   Motrin [Ibuprofen] Swelling     Home Medications  Prior to Admission medications   Medication Sig Start Date End Date Taking? Authorizing Provider  acetaminophen (TYLENOL) 325 MG tablet Take 2 tablets (650 mg total) by mouth every 6 (six) hours as needed for mild pain or headache (or Fever >/= 101). 05/09/22  Yes Barton Dubois, MD  ascorbic acid (VITAMIN C) 500 MG tablet Take 1 tablet (500 mg total) by mouth daily. 05/10/22  Yes Barton Dubois, MD  aspirin EC 81 MG tablet Take 81 mg by mouth daily.     Yes [provider]  atorvastatin (LIPITOR) 20 MG tablet Take 1 tablet (20 mg total) by mouth daily. 05/10/22  Yes Barton Dubois, MD  citalopram (CELEXA) 20 MG tablet Take 20 mg by mouth daily. 03/31/22  Yes [provider]  cloNIDine (CATAPRES) 0.1 MG tablet Take 3 tablets (0.3 mg total) by mouth 2 (two) times daily. 05/09/22  Yes Barton Dubois, MD  dextromethorphan-guaiFENesin [facility]) 30-600 MG per 12 hr tablet Take 1 tablet by mouth 2 (two) times daily.   Yes [provider]  docusate sodium (COLACE) 100 MG capsule Take 100 mg by mouth daily.     Yes [provider]  fluconazole (DIFLUCAN) 150 MG tablet Take 1  tablet (150 mg total) by mouth daily for 22 days. Take  2 pills the first day, then continue one pill daily 05/13/22 06/04/22 Yes Harvel Quale, MD  folic acid (FOLVITE) 1 MG tablet Take 1 tablet (1 mg total) by mouth daily. 10/17/21  Yes Tat, Shanon Brow, MD  Ipratropium-Albuterol (COMBIVENT) 20-100 MCG/ACT AERS respimat Inhale 1 puff into the lungs every 6 (six) hours as needed for wheezing or shortness of breath. 05/09/22  Yes Barton Dubois, MD  loratadine (CLARITIN) 10 MG tablet Take 10 mg by mouth daily. 03/12/22  Yes [provider]  metoprolol tartrate (LOPRESSOR) 50 MG tablet Take 50 mg by mouth 2 (two) times daily. 08/10/21  Yes [provider]  mirtazapine (REMERON) 7.5 MG tablet Take 7.5 mg by mouth at bedtime. 04/27/22  Yes [provider]  pantoprazole (PROTONIX) 40 MG tablet Take 1 tablet (40 mg total) by mouth 2 (two) times daily. 05/09/22 05/09/23 Yes Barton Dubois, MD  vitamin B-12 (CYANOCOBALAMIN) 500 MCG tablet Take 1 tablet (500 mcg total) by mouth daily. 10/16/21  Yes Tat, Shanon Brow, MD  zinc sulfate 220 (50 Zn) MG capsule Take 1 capsule (220 mg total) by mouth daily. 05/10/22  Yes Barton Dubois, MD  megestrol (MEGACE) 400 MG/10ML suspension Take 10 mLs (400 mg total) by mouth 2 (two) times daily. Patient not taking: Reported on 05/29/2022 05/09/22   Barton Dubois, MD  predniSONE (DELTASONE) 10 MG tablet Take 4 tablets by mouth daily x1 day; then 3 tablet by mouth daily x1 day; then 2 tablet by mouth daily x2 days; then 1 tablet by mouth daily x3 days and stop prednisone. Patient not taking: Reported on 05/29/2022 05/09/22   Barton Dubois, MD     Critical care time: 79 minutes  Chesley Mires, MD [place] Pager - [phone] 05/29/2022, 1:18 PM

## 2022-05-29 NOTE — Progress Notes (Signed)
Moved patient from [facility] to ICU 08 without incident.  ICU RT aware of move and patient's Bipap settings.

## 2022-05-29 NOTE — Assessment & Plan Note (Addendum)
-  Baseline creatinine 0.8-1.1 -Continue to follow renal function trend/stability.

## 2022-05-29 NOTE — Consult Note (Addendum)
Cardiology Consultation   Patient ID: [name] MRN: [id number]; DOB: [birth year]  Admit date: 05/28/2022 Date of Consult: 05/29/2022  PCP:  Carrolyn Meiers, [place] Providers Cardiologist:  None        Patient Profile:   [name] is a 71 y.o. female with a hx of reported CAD with MI/PTCA and possible stent to RCA in 1997, NSTEMI 2004 with occluded RCA treated medically, HTN, dyslipidemia, anxiety, depression with prior SI, remote CVA ~2003, former tobacco abuse who is being seen 05/29/2022 for the evaluation of elevated troponin at the request of Dr. Carles Collet.  History of Present Illness:   Per very remote notes, patient had MI in 1997 with stenting to RCA then NSTEMI 2004 with reported occluded RCA with collaterals with mild disease in the left system treated medically at that time. She has not been following with cardiology in recent years. She was recently admitted 8/12-8/19/23 with abdominal pain, nausea, vomiting, and diarrhea. She was found to have acute respiratory failure with Covid-19 infection requiring supplemental O2 (weaned at discharge), severe acute colitis on CT, minimally elevated troponin without ACS symptoms, AKI with Cr 2.44, elevated lipase, anemia and thrombocytopenia, electrolyte abnormalities, dysphagia with plan for OP workup, and possible COPD/emphysema on imaging. She returned to [facility] with SOB. She had reported some SOB after discharge that got significantly worse yesterday. She was hypoxic at 77% RA in triage, severely dyspneic, tachycardic, tachypneic and tripoding with abnormal ABG. She was placed on BiPAP with initial consideration of intubation but she improved wtith supportive care. Labs showed BNP of 72, hsTroponin 13->375->478, procalcitonin 0.64, Hgb 8.3 (downtrended from 10-11 last admission), plt 303, repeat Covid/flu neg.   She is on BiPAP which does make history challenging as it can be difficult to fully  understand her when she speaks. She reports she has had some intermittent chest pain prior to this admission but is unclear on the timeframe - would come on when she is stressed out or exerting herself, improved with rest/calming down. She states she would ignore it because it would just go away. This morning she developed some mild chest burning after the hospitalist came in. She remains on BiPAP at this time and tachycardic (sinus tach). The main concern presently is possible PE. VQ was changed to CTA this morning, still pending at this time with pre-medication. She has received nebs, 125mg  IV SoluMedrol, morphine, IV fluids, IV heparin, IV protonix, and being started on IV metoprolol.   Initial tracings showed sinus tach without acute STT changes. Today's EKG is more abnormal with precordial TWI and possible Q waves I, avL but there were negative P waves in I, avL raising question of lead placement. Tracing was subsequently repeated with verification of lead placement and continues to show anterior TW changes in V3-V6.  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 Colonic diverticulosis. Inadequate prepartation  precluded examination of all the rectal and colonic.  I suspect slow colonic transit in the setting of a markely redundant colon.   CORONARY ANGIOPLASTY WITH STENT PLACEMENT  [id number]   cyst removed from left wrist     ERCP N/A 10/10/2021   Procedure: ENDOSCOPIC RETROGRADE CHOLANGIOPANCREATOGRAPHY (ERCP);  Surgeon: Rogene Houston, MD;  Location: [facility] ORS;  Service: Gastroenterology;  Laterality: N/A;   ERCP N/A 10/13/2021   Procedure: ENDOSCOPIC RETROGRADE CHOLANGIOPANCREATOGRAPHY (ERCP);  Surgeon: Rogene Houston, MD;  Location: [facility] ORS;  Service: Gastroenterology;  Laterality: N/A;   ESOPHAGEAL DILATION  05/08/2022   Procedure: ESOPHAGEAL DILATION;  Surgeon: Montez Morita, Quillian Quince, MD;  Location: [facility] ENDO SUITE;  Service: Gastroenterology;;  savory dilation'   ESOPHAGOGASTRODUODENOSCOPY  09/05/2004   Dr Chucky May gastritis, candida esophagitis   ESOPHAGOGASTRODUODENOSCOPY  09/07/2011   [id number], esophageal in the distal esophagus/Mild gastritis   ESOPHAGOGASTRODUODENOSCOPY (EGD) WITH PROPOFOL N/A 05/08/2022   Procedure: ESOPHAGOGASTRODUODENOSCOPY (EGD) WITH PROPOFOL;  Surgeon: Harvel Quale, MD;  Location: [facility] ENDO SUITE;  Service: Gastroenterology;  Laterality: N/A;  with possible esophageal dilation   Lumps removed from each breast     SPHINCTEROTOMY N/A 10/10/2021   Procedure: SPHINCTEROTOMY INCOMPLETE, STONE NOT REMOVED;  Surgeon: Rogene Houston, MD;  Location: [facility] ORS;  Service: Gastroenterology;  Laterality: N/A;     Home Medications:  Prior to Admission medications   Medication Sig Start Date End Date Taking? Authorizing Provider  acetaminophen (TYLENOL) 325 MG tablet Take 2 tablets (650 mg total) by mouth every 6 (six) hours as needed for mild pain or headache (or Fever >/= 101). 05/09/22  Yes Barton Dubois, MD  ascorbic acid (VITAMIN C) 500 MG tablet Take 1 tablet (500 mg total) by mouth daily. 05/10/22  Yes Barton Dubois, MD  aspirin EC 81 MG tablet Take 81 mg by  mouth daily.     Yes [provider]  atorvastatin (LIPITOR) 20 MG tablet Take 1 tablet (20 mg total) by mouth daily. 05/10/22  Yes Barton Dubois, MD  citalopram (CELEXA) 20 MG tablet Take 20 mg by mouth daily. 03/31/22  Yes [provider]  cloNIDine (CATAPRES) 0.1 MG tablet Take 3 tablets (0.3 mg total) by mouth 2 (two) times daily. 05/09/22  Yes Barton Dubois, MD  dextromethorphan-guaiFENesin [facility] DM) 30-600 MG per 12 hr tablet Take 1 tablet by mouth 2 (two) times daily.   Yes [provider]  docusate sodium (COLACE) 100 MG capsule Take 100 mg by mouth daily.     Yes [provider]  fluconazole (DIFLUCAN) 150 MG tablet Take 1 tablet (150 mg total) by mouth daily for 22 days. Take 2 pills the first day, then continue one pill daily 05/13/22 06/04/22 Yes Harvel Quale, MD  folic acid (FOLVITE) 1 MG tablet Take 1 tablet (1 mg total) by mouth daily. 10/17/21  Yes Tat, Shanon Brow, MD  Ipratropium-Albuterol (COMBIVENT) 20-100 MCG/ACT AERS respimat Inhale 1 puff into the lungs every 6 (six) hours as needed for wheezing or shortness of breath. 05/09/22  Yes Barton Dubois, MD  loratadine (CLARITIN) 10 MG tablet Take 10 mg by mouth daily. 03/12/22  Yes [provider]  metoprolol tartrate (LOPRESSOR) 50 MG tablet Take 50  mg by mouth 2 (two) times daily. 08/10/21  Yes [provider]  mirtazapine (REMERON) 7.5 MG tablet Take 7.5 mg by mouth at bedtime. 04/27/22  Yes [provider]  pantoprazole (PROTONIX) 40 MG tablet Take 1 tablet (40 mg total) by mouth 2 (two) times daily. 05/09/22 05/09/23 Yes Barton Dubois, MD  vitamin B-12 (CYANOCOBALAMIN) 500 MCG tablet Take 1 tablet (500 mcg total) by mouth daily. 10/16/21  Yes Tat, Shanon Brow, MD  zinc sulfate 220 (50 Zn) MG capsule Take 1 capsule (220 mg total) by mouth daily. 05/10/22  Yes Barton Dubois, MD  megestrol (MEGACE) 400 MG/10ML suspension Take 10 mLs (400 mg total) by mouth 2 (two) times  daily. Patient not taking: Reported on 05/29/2022 05/09/22   Barton Dubois, MD  predniSONE (DELTASONE) 10 MG tablet Take 4 tablets by mouth daily x1 day; then 3 tablet by mouth daily x1 day; then 2 tablet by mouth daily x2 days; then 1 tablet by mouth daily x3 days and stop prednisone. Patient not taking: Reported on 05/29/2022 05/09/22   Barton Dubois, MD    Inpatient Medications: Scheduled Meds:  Chlorhexidine Gluconate Cloth  6 each Topical Q0600   diphenhydrAMINE  50 mg Oral Once   Or   diphenhydrAMINE  50 mg Intravenous Once   metoprolol tartrate  5 mg Intravenous Q6H   pantoprazole (PROTONIX) IV  40 mg Intravenous Q12H   Continuous Infusions:  sodium chloride 100 mL/hr at 05/29/22 0231   heparin 700 Units/hr (05/29/22 0627)   PRN Meds: acetaminophen **OR** acetaminophen, Ipratropium-Albuterol, ondansetron **OR** ondansetron (ZOFRAN) IV, polyethylene glycol  Allergies:    Allergies  Allergen Reactions   Penicillins Anaphylaxis   Aspirin Swelling and Other (See Comments)    Only in large doses will cause a reaction   Bee Venom Swelling    Severe swelling   Contrast Media [Iodinated Contrast Media] Swelling   Dilaudid [Hydromorphone Hcl] Itching   Lisinopril Swelling   Motrin [Ibuprofen] Swelling    Social History:   Social History   Socioeconomic History   Marital status: Widowed    Spouse name: Not on file   Number of children: 3   Years of education: Not on file   Highest education level: Not on file  Occupational History   Occupation: disabled, Advertising account planner  Tobacco Use   Smoking status: Former    Packs/day: 0.25    Years: 40.00    Total pack years: 10.00    Types: Cigarettes    Quit date: 08/02/2017    Years since quitting: 4.8   Smokeless tobacco: Never  Vaping Use   Vaping Use: Never used  Substance and Sexual Activity   Alcohol use: No    Alcohol/week: 0.0 standard drinks of alcohol   Drug use: Yes    Types: Marijuana   Sexual activity: Not  Currently    Birth control/protection: Post-menopausal  Other Topics Concern   Not on file  Social History Narrative   Lives alone   Social Determinants of Health   Financial Resource Strain: Not on file  Food Insecurity: Not on file  Transportation Needs: Not on file  Physical Activity: Not on file  Stress: Not on file  Social Connections: Not on file  Intimate Partner Violence: Not on file    Family History:   Family History  Problem Relation Age of Onset   Multiple sclerosis Father    Alzheimer's disease Mother    Hypertension Mother    Diabetes Mother  Colon cancer Neg Hx      ROS:  Please see the history of present illness.  All other ROS reviewed and negative.     Physical Exam/Data:   Vitals:   05/29/22 0500 05/29/22 0600 05/29/22 0752 05/29/22 0800  BP: 115/65 128/61  (!) 148/88  Pulse: (!) 111 (!) 113    Resp: (!) 24 17    Temp:      TempSrc:      SpO2: 100% 100% 100% 97%  Weight:      Height:        Intake/Output Summary (Last 24 hours) at 05/29/2022 0855 Last data filed at 05/29/2022 0231 Gross per 24 hour  Intake 1078.75 ml  Output --  Net 1078.75 ml      05/28/2022    4:19 PM 05/02/2022    6:28 AM 10/23/2021    9:16 AM  Last 3 Weights  Weight (lbs) 88 lb 2.9 oz 88 lb 2.9 oz 90 lb  Weight (kg) 40 kg 40 kg 40.824 kg     Body mass index is 17.22 kg/m.  General: Well developed, well nourished, in no acute distress. Head: Normocephalic, atraumatic, sclera non-icteric, no xanthomas, nares are without discharge. Neck: Negative for carotid bruits. JVP not elevated. Lungs: Diminished on BiPAP but no wheezing, rales or rhonchi. Increased RR. Heart: RRR S1 S2 without murmurs, rubs, or gallops.  Abdomen: Soft, non-tender, non-distended with normoactive bowel sounds. No rebound/guarding. Extremities: No clubbing or cyanosis. No edema. Distal pedal pulses are 2+ and equal bilaterally. Neuro: Alert and oriented X 3. Moves all extremities  spontaneously. Psych:  Responds to questions appropriately with a normal affect.   EKG:  The EKG was personally reviewed and demonstrates:    Multiple tracings reviewed:  Yesterday: sinus tach 144bpm, baseline artifact confounding interpretation, no acute precordial STTW changes F/u yesterday: sinus tach 136bpm, no acute STT changes   Today: sinus tach 117bpm, right axis deviation, low voltage QRS, possible lateral Q waves I, avL with anterolateral TW changes V2-V6 -> There were negative P waves in I, avL raising question of lead placement. Tracing was subsequently repeated with verification of lead placement and continues to show anterior TW changes in V3-V6.   Telemetry:  Telemetry was personally reviewed and demonstrates:  sinus tach  Relevant CV Studies: 2019 echo   - Left ventricle: The cavity size was normal. Wall thickness was    normal. Systolic function was normal. The estimated ejection    fraction was in the range of 55% to 60%. Possible akinesis of the    basalinferolateral myocardium. Left ventricular diastolic    function parameters were normal for the patient&'s age.  - Aortic valve: Mildly calcified annulus. Trileaflet.  - Mitral valve: Mildly calcified annulus. There was trivial    regurgitation.  - Atrial septum: No defect or patent foramen ovale was identified.  - Tricuspid valve: There was trivial regurgitation.  - Pulmonary arteries: Systolic pressure could not be accurately    estimated.  - Pericardium, extracardiac: There was no pericardial effusion.   Carotid US 2019 IMPRESSION: Less than 50% stenosis in the right and left internal carotid arteries. There is plaque in both bulbs and the left common carotid artery.  Cath 2004 DATE OF PROCEDURE:  12/11/2002  DATE OF DISCHARGE:  12/11/2002                               CARDIAC CATHETERIZATION  REFERRING PHYSICIAN:  Dr. [place] Bing.    PROCEDURES PERFORMED:  1. Left heart catheterization  with selective coronary angiography.  2. Ventriculography.    DIAGNOSES:  1. Single-vessel coronary artery disease with an occluded right coronary     artery.  2. Normal left ventricular systolic function with inferior akinesis.    INDICATION:  The patient is a 71 year old African American with a prior  history of myocardial infarction (inferior wall myocardial infarction) with  stent to the right coronary artery in 1997.  The patient now presents with a  non-Q-wave myocardial infarction after acute onset of substernal chest  pressure.  She had mild troponin elevation of approximately 2.1.  She is now  referred for diagnostic catheterization to assess her coronary anatomy.    DESCRIPTION OF PROCEDURE:  After informed consent was obtained, the patient  was brought to the catheterization laboratory.  The right groin was  sterilely prepped and draped.  Lidocaine 1% was infiltrated.  A 6-French  arterial sheath was placed using a modified Seldinger technique.  Following  this, 6-French JL4 and JR4 catheters were used to engage the left and right  coronary arteries.  Ventriculography was performed in single-plane RAO  projection using power injection of contrast.  At the termination of the  procedure, all catheters and sheaths were removed and the patient was  brought back to the holding area.    FINDINGS:  Left ventricular systolic pressure of 170/5 mmHg.  Aortic  pressure 152/86 mmHg.    VENTRICULOGRAPHY:  Ejection fraction was approximately 50-60% with a large  area of inferior akinesis.  No significant mitral regurgitation.    SELECTIVE ANGIOGRAPHY:  1. Left main coronary artery was a large-caliber vessel with no evidence of     flow-limiting disease.  2. Left anterior descending artery was a large-caliber vessel giving rise to     several diagonal branches with no evidence of flow-limiting disease.  3. Circumflex had some mild plaquing in the proximal segment.  Obtuse      marginal branches were free of flow-limiting disease.  4. Right coronary artery was a small vessel.  Stent was visualized in the     proximal-to-mid segment.  There was diffuse in-stent restenosis of     approximately 40-50% with tandem lesions at the distal end of the stent     of approximately 99%.  The remainder of the RCA was diffusely diseased     and ultimately occluded in its distal left segment.  There were left and     right collaterals, septal perforators collateralizing to the right     coronary artery.    RECOMMENDATION:  Angiographic images were carefully reviewed with Dr. Veneda Melter.  The plan is to continue to medical therapy.  The right coronary  artery appears to be occluded but there are left-to-right collaterals.  Unless the patient has recurrent substernal chest pain and is failing  medical therapy, then critical consideration will be given to percutaneous  intervention.  This, however, would require stenting of the entire right  coronary artery and I suspect the distal RCA has been a rather chronic  occlusion.  The patient does have a small gradient across the aortic valve  and should undergo 2-D echocardiographic study, however, the left  ventricular and the aortic pressure were very variable during the phase of  respiration.  The patient also has significant hypertension and should be  aggressively treated for her hypertension.  Ernestine Mcmurray, M.D. [facility]      GED/MEDQ  D:  12/11/2002  T:  12/12/2002  Job:  [id number]    Laboratory Data:  High Sensitivity Troponin:   Recent Labs  Lab 05/02/22 0720 05/02/22 0920 05/28/22 1640 05/28/22 2259 05/29/22 0438  TROPONINIHS 34* 30* 13 375* 478*     Chemistry Recent Labs  Lab 05/28/22 1640 05/29/22 0438  NA 137 141  K 4.3 4.3  CL 109 114*  CO2 22 21*  GLUCOSE 120* 91  BUN 17 13  CREATININE 1.08* 1.03*  CALCIUM 8.6* 8.3*  GFRNONAA 55* 58*  ANIONGAP 6 6     Recent Labs  Lab 05/28/22 1640  PROT 7.5  ALBUMIN 3.3*  AST 39  ALT 32  ALKPHOS 107  BILITOT 0.6   Lipids No results for input(s): "CHOL", "TRIG", "HDL", "LABVLDL", "LDLCALC", "CHOLHDL" in the last 168 hours.  Hematology Recent Labs  Lab 05/28/22 1640 05/29/22 0438  WBC 6.3 5.6  RBC 2.47* 2.41*  HGB 8.6* 8.3*  HCT 28.5* 27.4*  MCV 115.4* 113.7*  MCH 34.8* 34.4*  MCHC 30.2 30.3  RDW 17.7* 17.1*  PLT 341 303   Thyroid No results for input(s): "TSH", "FREET4" in the last 168 hours.  BNP Recent Labs  Lab 05/28/22 1640  BNP 72.0    DDimer  Recent Labs  Lab 05/28/22 1725  DDIMER 3.97*     Radiology/Studies:  DG Chest Portable 1 View  Result Date: 05/28/2022 CLINICAL DATA:  Shortness of breath for 2 weeks.  COVID positive. EXAM: PORTABLE CHEST 1 VIEW COMPARISON:  05/02/2022 FINDINGS: Lungs are hyperinflated. Significant bullous changes at the RIGHT lung apex, similar to prior. There are no focal consolidations or pleural effusions. No pulmonary edema. IMPRESSION: Hyperinflation of the lungs. No evidence for acute pulmonary abnormality. Electronically Signed   By: Nolon Nations M.D.   On: 05/28/2022 17:12     Assessment and Plan:   1. Acute hypoxic respiratory failure with recent hypoxia in setting of Covid-1 infection - remains on BiPAP at this time, consider pulm consultation - eval for PE pending - echo also pending, relayed request to expedite to echo tech who is working alone today but will be able to do at lunch time - BNP totally normal - with normal BMI this argues against fluid  2. Elevated troponin, history of CAD, dyslipidemia  - hsTroponin initially normal at 13 then elevated to 375->478. Given known coronary anatomy with occluded RCA, not surprising to see troponin bump with such severe physiologic stressors including profound hypoxia and worsening anemia - f/u troponin is pending to help guide next steps - remains on heparin for PE r/o - EKG  beginning to show anterior TWI in precordial leads. Has ASA allergy. Per d/w MD, since getting contrast with pre-medication, hold off on ASA so we do not muddy the waters in case of any reaction. Pt is not a great historian with regard to prior allergy hx - agree with IV metoprolol - hold on statin until taking orals off BiPAP - echo pending - lipid panel in AM - check TSH given sinus tach as well though suspect due to acute illness  3. Essential HTN - on clonidine 0.3mg  BID, Lopressor 50mg  BID as OP -> was hypotensive overnight but last BP starting to elevate - need to be cautious about abrupt cessation of clonidine with rebound HTN, can add meds back if BP stable  4. Worsening anemia of unclear etiology, macrocytic - needs further  evaluation, will defer to internal med team - will order FOBT  5. Probable CKD stage IIIa - Cr remains improved from recent admission with AKI - follow   Risk Assessment/Risk Scores:     TIMI Risk Score for Unstable Angina or Non-ST Elevation MI:   The patient's TIMI risk score is 4, which indicates a 20% risk of all cause mortality, new or recurrent myocardial infarction or need for urgent revascularization in the next 14 days.          For questions or updates, please contact [place] Please consult [email or web address] for contact info under    Signed, Charlie Pitter, PA-C  05/29/2022 8:55 AM  Patient seen and examined.  Agree with above documentation.  Ms. Bartron is a 71 year old female with a history of CAD, hypertension, hyperlipidemia, CVA, tobacco use who we are consulted by Dr. Carles Collet for evaluation of troponin elevation.  She has known occluded RCA from cath in 2004 that has been treated medically.  She had admission in August 2023 with COVID-19, course was complicated by AKI and acute colitis.  She presented back to ED yesterday with shortness of breath.  She was markedly hypoxic down to SPO2 77% on presentation.  Started on BiPAP.  Remains  on BiPAP with most recent vitals showing pulse 113, BP 148/88, SPO2 97%.  Labs notable for creatinine 1.08, potassium 4.3, sodium 137, BNP 72, troponin 13 > 375 > 478 > 400.  Procalcitonin 0.6, hemoglobin 8.3, WBC 5.6, platelets 303.  Chest x-ray showed no acute abnormalities.  EKG shows sinus tachycardia, rate 108, low voltage, T wave inversions V3-6.  Denies any chest pain currently.  On exam, patient is alert and oriented, tachycardic regular, no murmurs, lungs with diminished breath sounds, no LE edema.    Her presentation with significant hypoxia and normal chest x-ray concerning for PE.  Reported contrast allergy (swelling), she is unsure of details, reports allergy occurred years ago.  She is getting IV Solu-Medrol and plan for CTPA today.  Will follow-up results of CTPA.  For troponin elevation, could represent demand ischemia in setting of significant hypoxia and possible PE.  Check echocardiogram to evaluate for new wall motion abnormality.  Continue heparin drip for now.  Hold off on aspirin in setting of her aspirin allergy.  Hemoglobin is 8.3, will monitor on heparin drip.  Donato Heinz, MD

## 2022-05-29 NOTE — Assessment & Plan Note (Addendum)
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 -EKG--sinus rhythm, T wave depression V2-V5 -9/8 echo--EF 35-40%, apical HK, trivial MR/TR -9/10--repeat limited Echo-EF 35-40%, unchanged -9/11 started on Plavix after finalization of heparin drip. -Allergy planning the possibility for cardiac catheterization once medically stable and able to lay down flat. -Continue the use of bisoprolol and losartan. -Continue to follow cardiology service recommendations.  Based on patient's mood fluctuation and intermittent episode of combativeness/agitation they are considering the possibility of just medical management.

## 2022-05-29 NOTE — Progress Notes (Signed)
ANTICOAGULATION CONSULT NOTE - Initial Consult  Pharmacy Consult:  Heparin Indication:  ACS  Allergies  Allergen Reactions   Penicillins Anaphylaxis   Aspirin Swelling and Other (See Comments)    Only in large doses will cause a reaction   Bee Venom Swelling    Severe swelling   Contrast Media [Iodinated Contrast Media] Swelling   Dilaudid [Hydromorphone Hcl] Itching   Lisinopril Swelling   Motrin [Ibuprofen] Swelling    Patient Measurements: Height: 5' (152.4 cm) Weight: 40 kg (88 lb 2.9 oz) IBW/kg (Calculated) : 45.5 Heparin Dosing Weight: 40 kg  Vital Signs: Temp: 97.8 F (36.6 C) (09/08 1100) Temp Source: Oral (09/08 1100) BP: 141/76 (09/08 1400) Pulse Rate: 100 (09/08 1600)  Labs: Recent Labs    05/28/22 1640 05/28/22 2259 05/29/22 0438 05/29/22 0811 05/29/22 1012 05/29/22 1509  HGB 8.6*  --  8.3*  --   --   --   HCT 28.5*  --  27.4*  --   --   --   PLT 341  --  303  --   --   --   HEPARINUNFRC  --   --  0.25*  --   --  0.39  CREATININE 1.08*  --  1.03*  --   --   --   TROPONINIHS 13   < > 478* 400* 368*  --    < > = values in this interval not displayed.     Estimated Creatinine Clearance: 31.2 mL/min (A) (by C-G formula based on SCr of 1.03 mg/dL (H)).   Medical History: Past Medical History:  Diagnosis Date   Anxiety    Blind    right   Colitis 09/07/2011   CVA (cerebral infarction)    GERD (gastroesophageal reflux disease)    Hyperlipidemia    Hypertension    Myocardial infarct (HCC)    1997, 2004   Severe major depression with psychotic features (HCC)    2004   Stroke [facility]) 2002    Assessment: [name] presented with SOB in setting of recent Covid.  Pharmacy consulted to dose IV heparin for PE initially (ruled out), changed to possible MI requiring heparin gtt for 48 hours per cardiology.  Baseline labs reviewed.  05/29/2022 AM labs HL 0.39 therapeutic There have been no issues with the infusion or signs of bleeding according to the  nurse providing care.   Goal of Therapy:  Heparin level 0.3-0.7 units/ml Monitor platelets by anticoagulation protocol: Yes   Plan:   Continue heparin gtt at 700 units/hr Check 8 hr heparin level Daily heparin level and CBC  Luan Pulling, PharmD, [facility] Clinical Pharmacist  05/29/2022 4:38 PM  Contact: [phone] after 3 PM  "Be curious, not judgmental..." -Debbora Dus

## 2022-05-29 NOTE — Progress Notes (Signed)
Echo shows EF 35-40% with regional wall motion abnormalities.  Normal systolic function at base with apical akinesis, differential includes LAD infarct vs stress induced cardiomyopathy.  CTPA showed no PE but severe COPD and multifocal atelectasis versus pneumonia.  Troponin peaked at 478.  She is being treated for pneumonia and COPD exacerbation after recent COVID-19 infection.  Procalcitonin elevated.  Systolic dysfunction is out of proportion to troponin elevation, suspect more likely troponin elevation represents demand ischemia in setting of her acute illness as she had significant hypoxia on presentation.  However cannot rule out LAD territory infarct.  Will need ischemia evaluation at some point.  This is currently limited by her respiratory status and anemia (hemoglobin 8.3, down from 11.1 on 05/05/2022; no overt bleeding).  Can continue heparin drip x48 hours, would closely monitor hemoglobin.  If hemoglobin stable with no evidence of bleeding, and EF remains reduced as she recovers from her acute illness, would plan for cath.  D/w Dr Arbutus Leas  Little Ishikawa, MD

## 2022-05-30 ENCOUNTER — Inpatient Hospital Stay (HOSPITAL_COMMUNITY): Payer: Medicare Other

## 2022-05-30 DIAGNOSIS — I5020 Unspecified systolic (congestive) heart failure: Secondary | ICD-10-CM | POA: Diagnosis not present

## 2022-05-30 DIAGNOSIS — I428 Other cardiomyopathies: Secondary | ICD-10-CM

## 2022-05-30 DIAGNOSIS — I429 Cardiomyopathy, unspecified: Secondary | ICD-10-CM

## 2022-05-30 DIAGNOSIS — J9601 Acute respiratory failure with hypoxia: Secondary | ICD-10-CM | POA: Diagnosis not present

## 2022-05-30 DIAGNOSIS — N179 Acute kidney failure, unspecified: Secondary | ICD-10-CM | POA: Diagnosis not present

## 2022-05-30 DIAGNOSIS — E440 Moderate protein-calorie malnutrition: Secondary | ICD-10-CM | POA: Insufficient documentation

## 2022-05-30 DIAGNOSIS — I5181 Takotsubo syndrome: Secondary | ICD-10-CM

## 2022-05-30 DIAGNOSIS — J181 Lobar pneumonia, unspecified organism: Secondary | ICD-10-CM

## 2022-05-30 DIAGNOSIS — E875 Hyperkalemia: Secondary | ICD-10-CM | POA: Diagnosis not present

## 2022-05-30 LAB — BLOOD GAS, ARTERIAL
Acid-base deficit: 11.2 mmol/L — ABNORMAL HIGH (ref 0.0–2.0)
Acid-base deficit: 9.3 mmol/L — ABNORMAL HIGH (ref 0.0–2.0)
Bicarbonate: 17.5 mmol/L — ABNORMAL LOW (ref 20.0–28.0)
Bicarbonate: 18.8 mmol/L — ABNORMAL LOW (ref 20.0–28.0)
Drawn by: 61774
Drawn by: 27407
FIO2: 100 %
FIO2: 50 %
O2 Saturation: 99.9 %
O2 Saturation: 95.4 %
Patient temperature: 37
Patient temperature: 36.9
pCO2 arterial: 49 mmHg — ABNORMAL HIGH (ref 32–48)
pCO2 arterial: 48 mmHg (ref 32–48)
pH, Arterial: 7.16 — CL (ref 7.35–7.45)
pH, Arterial: 7.2 — ABNORMAL LOW (ref 7.35–7.45)
pO2, Arterial: 243 mmHg — ABNORMAL HIGH (ref 83–108)
pO2, Arterial: 76 mmHg — ABNORMAL LOW (ref 83–108)

## 2022-05-30 LAB — CBC
HCT: 31.3 % — ABNORMAL LOW (ref 36.0–46.0)
HCT: 31.2 % — ABNORMAL LOW (ref 36.0–46.0)
Hemoglobin: 9.2 g/dL — ABNORMAL LOW (ref 12.0–15.0)
Hemoglobin: 9.3 g/dL — ABNORMAL LOW (ref 12.0–15.0)
MCH: 34.6 pg — ABNORMAL HIGH (ref 26.0–34.0)
MCH: 35.4 pg — ABNORMAL HIGH (ref 26.0–34.0)
MCHC: 29.4 g/dL — ABNORMAL LOW (ref 30.0–36.0)
MCHC: 29.8 g/dL — ABNORMAL LOW (ref 30.0–36.0)
MCV: 117.7 fL — ABNORMAL HIGH (ref 80.0–100.0)
MCV: 118.6 fL — ABNORMAL HIGH (ref 80.0–100.0)
Platelets: 349 10*3/uL (ref 150–400)
Platelets: 278 10*3/uL (ref 150–400)
RBC: 2.66 MIL/uL — ABNORMAL LOW (ref 3.87–5.11)
RBC: 2.63 MIL/uL — ABNORMAL LOW (ref 3.87–5.11)
RDW: 16.8 % — ABNORMAL HIGH (ref 11.5–15.5)
RDW: 16.5 % — ABNORMAL HIGH (ref 11.5–15.5)
WBC: 5.7 10*3/uL (ref 4.0–10.5)
WBC: 10.3 10*3/uL (ref 4.0–10.5)
nRBC: 0.7 % — ABNORMAL HIGH (ref 0.0–0.2)
nRBC: 1.4 % — ABNORMAL HIGH (ref 0.0–0.2)

## 2022-05-30 LAB — IRON AND TIBC
Iron: 92 ug/dL (ref 28–170)
Saturation Ratios: 37 % — ABNORMAL HIGH (ref 10.4–31.8)
TIBC: 245 ug/dL — ABNORMAL LOW (ref 250–450)
UIBC: 153 ug/dL

## 2022-05-30 LAB — HEPARIN LEVEL (UNFRACTIONATED)
Heparin Unfractionated: 0.42 IU/mL (ref 0.30–0.70)
Heparin Unfractionated: 0.55 IU/mL (ref 0.30–0.70)

## 2022-05-30 LAB — FERRITIN: Ferritin: 7392 ng/mL — ABNORMAL HIGH (ref 11–307)

## 2022-05-30 LAB — BASIC METABOLIC PANEL
Anion gap: 5 (ref 5–15)
BUN: 19 mg/dL (ref 8–23)
CO2: 19 mmol/L — ABNORMAL LOW (ref 22–32)
Calcium: 8.7 mg/dL — ABNORMAL LOW (ref 8.9–10.3)
Chloride: 118 mmol/L — ABNORMAL HIGH (ref 98–111)
Creatinine, Ser: 1.22 mg/dL — ABNORMAL HIGH (ref 0.44–1.00)
GFR, Estimated: 47 mL/min — ABNORMAL LOW (ref >60)
Glucose, Bld: 193 mg/dL — ABNORMAL HIGH (ref 70–99)
Potassium: 5.6 mmol/L — ABNORMAL HIGH (ref 3.5–5.1)
Sodium: 142 mmol/L (ref 135–145)

## 2022-05-30 LAB — LIPID PANEL
Cholesterol: 143 mg/dL (ref 0–200)
HDL: 66 mg/dL (ref >40)
LDL Cholesterol: 72 mg/dL (ref 0–99)
Total CHOL/HDL Ratio: 2.2 RATIO
Triglycerides: 26 mg/dL (ref <150)
VLDL: 5 mg/dL (ref 0–40)

## 2022-05-30 LAB — FOLATE: Folate: 35.6 ng/mL (ref >5.9)

## 2022-05-30 LAB — GLUCOSE, CAPILLARY: Glucose-Capillary: 103 mg/dL — ABNORMAL HIGH (ref 70–99)

## 2022-05-30 LAB — VITAMIN B12: Vitamin B-12: 674 pg/mL (ref 180–914)

## 2022-05-30 LAB — BRAIN NATRIURETIC PEPTIDE: B Natriuretic Peptide: 1779 pg/mL — ABNORMAL HIGH (ref 0.0–100.0)

## 2022-05-30 MED ORDER — ORAL CARE MOUTH RINSE: 15.0000 mL | OROMUCOSAL | Status: DC | PRN

## 2022-05-30 MED ORDER — FUROSEMIDE 10 MG/ML IJ SOLN
40.0000 mg | Freq: Once | INTRAMUSCULAR | Status: AC
  Administered 2022-05-30: 40 mg via INTRAVENOUS
  Filled 2022-05-30: qty 4

## 2022-05-30 MED ORDER — FLUMAZENIL 0.5 MG/5ML IV SOLN
INTRAVENOUS | Status: AC
  Filled 2022-05-30: qty 5

## 2022-05-30 MED ORDER — DOCUSATE SODIUM 50 MG/5ML PO LIQD
100.0000 mg | Freq: Two times a day (BID) | ORAL | Status: DC
  Administered 2022-05-30: 100 mg
  Filled 2022-05-30: qty 10

## 2022-05-30 MED ORDER — MIDAZOLAM HCL 2 MG/2ML IJ SOLN
1.0000 mg | INTRAMUSCULAR | Status: AC | PRN
  Administered 2022-05-31 (×3): 1 mg via INTRAVENOUS
  Filled 2022-05-30 (×3): qty 2

## 2022-05-30 MED ORDER — MIDAZOLAM HCL 2 MG/2ML IJ SOLN
INTRAMUSCULAR | Status: AC
  Filled 2022-05-30: qty 2

## 2022-05-30 MED ORDER — FENTANYL CITRATE PF 50 MCG/ML IJ SOSY
PREFILLED_SYRINGE | INTRAMUSCULAR | Status: AC
  Filled 2022-05-30: qty 2

## 2022-05-30 MED ORDER — FUROSEMIDE 10 MG/ML IJ SOLN: 40.0000 mg | Freq: Two times a day (BID) | INTRAMUSCULAR | Status: DC

## 2022-05-30 MED ORDER — FENTANYL CITRATE PF 50 MCG/ML IJ SOSY
25.0000 ug | PREFILLED_SYRINGE | Freq: Once | INTRAMUSCULAR | Status: AC
  Administered 2022-05-30: 25 ug via INTRAVENOUS

## 2022-05-30 MED ORDER — LORAZEPAM 2 MG/ML IJ SOLN
0.5000 mg | Freq: Once | INTRAMUSCULAR | Status: AC
  Administered 2022-05-30: 0.5 mg via INTRAVENOUS
  Filled 2022-05-30: qty 1

## 2022-05-30 MED ORDER — ETOMIDATE 2 MG/ML IV SOLN
INTRAVENOUS | Status: AC
  Administered 2022-05-30: 20 mg
  Filled 2022-05-30: qty 20

## 2022-05-30 MED ORDER — METOPROLOL TARTRATE 5 MG/5ML IV SOLN
7.5000 mg | Freq: Four times a day (QID) | INTRAVENOUS | Status: DC
  Administered 2022-05-30 – 2022-06-05 (×20): 7.5 mg via INTRAVENOUS
  Filled 2022-05-30 (×22): qty 10

## 2022-05-30 MED ORDER — ORAL CARE MOUTH RINSE
15.0000 mL | OROMUCOSAL | Status: DC
  Administered 2022-05-30 – 2022-05-31 (×3): 15 mL via OROMUCOSAL

## 2022-05-30 MED ORDER — FENTANYL BOLUS VIA INFUSION
25.0000 ug | INTRAVENOUS | Status: DC | PRN
  Administered 2022-06-01: 50 ug via INTRAVENOUS
  Administered 2022-06-03 (×10): 100 ug via INTRAVENOUS
  Administered 2022-06-04: 25 ug via INTRAVENOUS

## 2022-05-30 MED ORDER — FENTANYL CITRATE PF 50 MCG/ML IJ SOSY
25.0000 ug | PREFILLED_SYRINGE | INTRAMUSCULAR | Status: DC | PRN
  Administered 2022-06-02 (×2): 100 ug via INTRAVENOUS

## 2022-05-30 MED ORDER — FENTANYL 2500MCG IN NS 250ML (10MCG/ML) PREMIX INFUSION
25.0000 ug/h | INTRAVENOUS | Status: DC
  Administered 2022-05-30: 25 ug/h via INTRAVENOUS
  Administered 2022-05-31: 100 ug/h via INTRAVENOUS
  Administered 2022-06-01: 125 ug/h via INTRAVENOUS
  Administered 2022-06-02: 150 ug/h via INTRAVENOUS
  Administered 2022-06-03: 50 ug/h via INTRAVENOUS
  Administered 2022-06-03: 75 ug/h via INTRAVENOUS
  Filled 2022-05-30 (×6): qty 250

## 2022-05-30 MED ORDER — POLYETHYLENE GLYCOL 3350 17 G PO PACK
17.0000 g | PACK | Freq: Every day | ORAL | Status: DC
  Administered 2022-05-30 – 2022-06-03 (×5): 17 g
  Filled 2022-05-30 (×6): qty 1

## 2022-05-30 MED ORDER — ROCURONIUM BROMIDE 10 MG/ML (PF) SYRINGE
PREFILLED_SYRINGE | INTRAVENOUS | Status: AC
  Filled 2022-05-30: qty 10

## 2022-05-30 MED ORDER — FENTANYL CITRATE (PF) 100 MCG/2ML IJ SOLN
INTRAMUSCULAR | Status: AC
  Filled 2022-05-30: qty 2

## 2022-05-30 MED ORDER — MIDAZOLAM HCL 2 MG/2ML IJ SOLN
1.0000 mg | INTRAMUSCULAR | Status: DC | PRN
  Administered 2022-05-31 – 2022-06-04 (×14): 1 mg via INTRAVENOUS
  Filled 2022-05-30 (×15): qty 2

## 2022-05-30 MED ORDER — SUCCINYLCHOLINE CHLORIDE 200 MG/10ML IV SOSY
PREFILLED_SYRINGE | INTRAVENOUS | Status: AC
  Administered 2022-05-30: 120 mg
  Filled 2022-05-30: qty 10

## 2022-05-30 MED ORDER — DEXMEDETOMIDINE HCL IN NACL 400 MCG/100ML IV SOLN: 0.0000 ug/kg/h | INTRAVENOUS | Status: DC

## 2022-05-30 MED ORDER — FENTANYL CITRATE PF 50 MCG/ML IJ SOSY
25.0000 ug | PREFILLED_SYRINGE | INTRAMUSCULAR | Status: DC | PRN
  Administered 2022-05-31 – 2022-06-02 (×2): 25 ug via INTRAVENOUS
  Filled 2022-05-30 (×2): qty 1

## 2022-05-30 NOTE — Progress Notes (Signed)
Patient's agitation continues as bed alarm is going off every 3 minutes since son left. He understands  his mothers' impulsive behavior and has requested to spend the night. Earlier hospitalist notified of agitation.

## 2022-05-30 NOTE — Progress Notes (Addendum)
Intubation: Patient preoxygenated and ED MD at bedside.  20mg  etomidate given 1905 120mg  Succinylcholine given 1905 1906 Patient intubated 7.5 ETT 21 @ the lip- positive color change noted and bilateral breath sounds heard.   Xray confirmed placement.

## 2022-05-30 NOTE — Assessment & Plan Note (Signed)
No signs of active blood loss Hgb stable since admission [place] Hgb 10-11 B12--674 Folate 35.6 Iron sat 37, ferritin 7392 Monitor H/H hemoccult

## 2022-05-30 NOTE — Assessment & Plan Note (Signed)
-  9/8 echo--EF 35-40%, apical HK, trivial MR/TR Discussed with cardiology>>suspect Takatsubo's  -finish 48 hours IV heparin Repeat limited Echo

## 2022-05-30 NOTE — Progress Notes (Incomplete)
Progress Notes Patient was intubated at shift change due to worsening acute respiratory failure with hypoxia and hypercapnia. BNP done was 1779, this was 72 about 2 days ago. Chest x-ray showed increasing left lung interstitial and airspace opacities which may represent edema or infection EKG personally reviewed showed sinus tachycardia at rate of 108 bpm with T wave inversion in leads V3 to V6  Echocardiogram done yesterday showed:  1. Left ventricular ejection fraction, by estimation, is 35 to 40%. The  left ventricle has moderately decreased function. The left ventricle demonstrates regional wall motion abnormalities (see scoring diagram/findings for description). Left ventricular  diastolic parameters are indeterminate.   2. Right ventricular systolic function is mildly reduced. The right ventricular size is normal. There is moderately elevated pulmonary artery systolic pressure. The estimated right ventricular systolic pressure is 51.0 mmHg.   3. The mitral valve is degenerative. Trivial mitral valve regurgitation.   4. The aortic valve was not well visualized. Aortic valve regurgitation is not visualized. No aortic stenosis is present.   5. The inferior vena cava is dilated in size with <50% respiratory variability, suggesting right atrial pressure of 15 mmHg.   Conclusion(s)/Recommendation(s): Normal systolic function at base with apical akinesis, differential includes LAD infarct vs stress induced cardiomyopathy.   Patient was already treated with IV Lasix 40 mg x 1 Continue total input/output, daily weights and fluid restriction Continue IV Lasix 40 mg twice daily (as tolerated by BP) Cardiology was already consulted, we shall await further recommendations  BP 127/61   Pulse (!) 118   Temp 98.4 F (36.9 C) (Axillary)   Resp 18   Ht 5' (1.524 m)   Wt 40 kg   SpO2 100%   BMI 17.22 kg/m    Physical Exam  Patient is examined daily including today onTODAY@  , exams remain the  same as of yesterday except that has changed   Gen:-Intubated and sedated  HEENT:- [place].AT, No sclera icterus Neck-Supple Neck,No JVD,.  Lungs-audible Rales in lower lobes bilaterally.  No wheezing CV- S1, S2 normal Abd-  +ve B.Sounds, Abd Soft, No tenderness,    Extremity/Skin:- No  edema,   *** Psych-affect is appropriate, oriented x3 Neuro-no new focal deficits, no tremors

## 2022-05-30 NOTE — Assessment & Plan Note (Addendum)
-  CTA chest as discussed above -MRSA+ -Patient has completed treatment with antibiotics. -PCT 0.64 -Tracheal aspiration without any growth. -Continue supportive care.

## 2022-05-30 NOTE — Progress Notes (Signed)
ANTICOAGULATION CONSULT NOTE - follow-up  Pharmacy Consult:  Heparin Indication:  ACS  Allergies  Allergen Reactions   Penicillins Anaphylaxis   Aspirin Swelling and Other (See Comments)    Only in large doses will cause a reaction   Bee Venom Swelling    Severe swelling   Contrast Media [Iodinated Contrast Media] Swelling   Dilaudid [Hydromorphone Hcl] Itching   Lisinopril Swelling   Motrin [Ibuprofen] Swelling    Patient Measurements: Height: 5' (152.4 cm) Weight: 40 kg (88 lb 2.9 oz) IBW/kg (Calculated) : 45.5 Heparin Dosing Weight: 40 kg  Vital Signs: Temp: 98 F (36.7 C) (09/08 1600) Temp Source: Axillary (09/08 1600) BP: 116/60 (09/08 2300) Pulse Rate: 110 (09/08 2300)  Labs: Recent Labs    05/28/22 1640 05/28/22 2259 05/29/22 0438 05/29/22 0811 05/29/22 1012 05/29/22 1509 05/29/22 2346  HGB 8.6*  --  8.3*  --   --   --   --   HCT 28.5*  --  27.4*  --   --   --   --   PLT 341  --  303  --   --   --   --   HEPARINUNFRC  --   --  0.25*  --   --  0.39 0.55  CREATININE 1.08*  --  1.03*  --   --   --   --   TROPONINIHS 13   < > 478* 400* 368*  --   --    < > = values in this interval not displayed.     Estimated Creatinine Clearance: 31.2 mL/min (A) (by C-G formula based on SCr of 1.03 mg/dL (H)).   Medical History: Past Medical History:  Diagnosis Date   Anxiety    Blind    right   Colitis 09/07/2011   CVA (cerebral infarction)    GERD (gastroesophageal reflux disease)    Hyperlipidemia    Hypertension    Myocardial infarct (HCC)    1997, 2004   Severe major depression with psychotic features (HCC)    2004   Stroke [facility]) 2002    Assessment: 45 YOF presented with SOB in setting of recent Covid.  Pharmacy consulted to dose IV heparin for PE initially (ruled out), changed to possible MI requiring heparin gtt for 48 hours per cardiology.  Baseline labs reviewed.  05/30/2022 early AM labs HL 0.55 therapeutic There have been no issues with the  infusion or signs of bleeding according to the nurse providing care.   Goal of Therapy:  Heparin level 0.3-0.7 units/ml Monitor platelets by anticoagulation protocol: Yes   Plan:   Continue heparin gtt at 700 units/hr Daily heparin level and CBC  Ritamarie Arkin BS, PharmD, BCPS Clinical Pharmacist 05/30/2022 12:33 AM  Contact: [phone] after 3 PM  "Be curious, not judgmental..." -Debbora Dus

## 2022-05-30 NOTE — Progress Notes (Addendum)
ANTICOAGULATION CONSULT NOTE - follow-up  Pharmacy Consult:  Heparin Indication:  ACS  Allergies  Allergen Reactions   Penicillins Anaphylaxis   Aspirin Swelling and Other (See Comments)    Only in large doses will cause a reaction   Bee Venom Swelling    Severe swelling   Contrast Media [Iodinated Contrast Media] Swelling   Dilaudid [Hydromorphone Hcl] Itching   Lisinopril Swelling   Motrin [Ibuprofen] Swelling    Patient Measurements: Height: 5' (152.4 cm) Weight: 40 kg (88 lb 2.9 oz) IBW/kg (Calculated) : 45.5 Heparin Dosing Weight: 40 kg  Vital Signs: Temp: 98.1 F (36.7 C) (09/09 0724) Temp Source: Axillary (09/09 0724) BP: 159/67 (09/09 0600) Pulse Rate: 118 (09/09 0500)  Labs: Recent Labs    05/28/22 1640 05/28/22 2259 05/29/22 0438 05/29/22 0811 05/29/22 1012 05/29/22 1509 05/29/22 2346 05/30/22 0525  HGB 8.6*  --  8.3*  --   --   --   --  9.2*  HCT 28.5*  --  27.4*  --   --   --   --  31.3*  PLT 341  --  303  --   --   --   --  349  HEPARINUNFRC  --    < > 0.25*  --   --  0.39 0.55 0.42  CREATININE 1.08*  --  1.03*  --   --   --   --  1.22*  TROPONINIHS 13   < > 478* 400* 368*  --   --   --    < > = values in this interval not displayed.     Estimated Creatinine Clearance: 26.3 mL/min (A) (by C-G formula based on SCr of 1.22 mg/dL (H)).   Medical History: Past Medical History:  Diagnosis Date   Anxiety    Blind    right   Colitis 09/07/2011   CVA (cerebral infarction)    GERD (gastroesophageal reflux disease)    Hyperlipidemia    Hypertension    Myocardial infarct (HCC)    1997, 2004   Severe major depression with psychotic features (HCC)    2004   Stroke [facility]) 2002    Assessment: 10 YOF presented with SOB in setting of recent Covid.  Pharmacy consulted to dose IV heparin for PE initially (ruled out), changed to possible MI requiring heparin gtt for 48 hours per cardiology.  Baseline labs reviewed.  HL 0.42 therapeutic There have  been no issues with the infusion or signs of bleeding according to the nurse providing care.   Goal of Therapy:  Heparin level 0.3-0.7 units/ml Monitor platelets by anticoagulation protocol: Yes   Plan:   Continue heparin gtt at 700 units/hr Daily heparin level and CBC  Gerre Pebbles Dominyck Reser, PharmD, MBA Clinical Pharmacist  "Be curious, not judgmental..." -Debbora Dus

## 2022-05-30 NOTE — Progress Notes (Signed)
eLink Physician-Brief Progress Note Patient Name: [name] DOB: 1951/03/12 MRN: [id number]   Date of Service  05/30/2022  HPI/Events of Note  PT had been intubated at shift change in ED and transferred to the ICU.  HR 114, BP 138/86 She is currently agitated, pulling at lines.    eICU Interventions  Placed initial sedation orders with fentanyl and precedex.  Will monitor for bradycardia, hypotension.  Placed orders for initial vent settings.  Restraints ordered as well to prevent unintentional extubation.         [name] M DELA CRUZ 05/30/2022, 7:33 PM

## 2022-05-30 NOTE — Progress Notes (Addendum)
PROGRESS NOTE  [name] [id number] DOB: May 08, 1951 DOA: 05/28/2022 PCP: Carrolyn Meiers, MD  Brief History:  71 year old female with a history of coronary disease, hypertension, hyperlipidemia, functional constipation, B12 and folate deficiency, anxiety, and prior stroke presenting with shortness of breath and respiratory failure.  The patient was recently mid to the hospital from 05/02/2022 to 05/09/2022 when she had acute respiratory failure secondary to COVID-19.  During that hospitalization, the patient was also noted to have colitis for which she was treated with levofloxacin and metronidazole.  She completed a course of antibiotics prior to discharge.  She was discharged home with a prednisone taper. She states that she has had some shortness of breath since discharge from the hospital, but this has significantly worsened in the 24 hours prior to admission.  Upon arrival to the emergency department, the patient was noted to have respiratory distress patient was placed on BiPAP. Repeat ABG on BiPAP did not show significant improvement.  There was plans for intubation, but the patient was given Ativan with improvement clinically.  She remained on BiPAP overnight.  Notably, the patient states that she started having some burning chest discomfort on the morning of 05/28/2022.  She states that it somewhat improved while she was on BiPAP, but continues to have the chest discomfort.  She was taken off of BiPAP in the morning of 05/29/2022 and stated that she had some worsening of her burning chest discomfort.  She developed some increased shortness of breath resulting in placement back on BiPAP on the morning of 05/29/22.  Troponins were noted to be 375>> 478.  D-dimer was also noted to be elevated 3.97.  The patient was started on IV heparin. The patient was given Solu-Medrol 125 mg IV, morphine 1 mg IV and placed back on BiPAP.  Repeat EKG showed sinus tachycardia with T wave  inversion V2-V5.  Troponins continue to be cycled. cardiology was consulted to assist with management.     Assessment and Plan: * Acute respiratory failure with hypoxia and hypercarbia (HCC) - Presented with respiratory distress with oxygen saturation 77% on room air -due to COPD exacerbation and pneumonia -Placed back on BiPAP 05/29/22 am -Wean off BiPAP as tolerated -CTA chest--neg PE, severe emphysema; multifocal patchy infiltrates RUL, RLL, LLL -PCT 0.64, BNP 72  COPD with acute exacerbation (HCC) Although the patient denies a history of COPD and has not had formal PFTs--> she endorses smoking over 30 years, 1/2 pack/day -Quit smoking 7 years ago -Start Yupelri -Start IV Solu-Medrol -Start Pulmicort  Elevated troponin Suspect demand ischemia -Continue IV heparin x 48 hours total -Cardiology consult appreciated>>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 -Patient does complain of chest burning -Personally reviewed EKG--sinus rhythm, T wave depression V2-V5 -9/8 echo--EF 35-40%, apical HK, trivial MR/TR  Lobar pneumonia (HCC) CTA chest as discussed above MRSA+ Continue vanc/aztreonam/azithro PCT 0.64  Cardiomyopathy ([place]) -9/8 echo--EF 35-40%, apical HK, trivial MR/TR Discussed with cardiology>>suspect Takatsubo's  -finish 48 hours IV heparin  Malnutrition of moderate degree Start supplements when stable off BiPAP  Anemia No signs of active blood loss Hgb stable since admission [place] Hgb 10-11 B12--674 Folate 35.6 Iron sat 37, ferritin 7392 Monitor H/H hemoccult  Chronic kidney disease, stage 3a (HCC) Baseline creatinine 0.8-1.1  Gastroesophageal reflux disease Restart pantoprazole  History of stroke Patient reports stroke was -started on 10/2001.  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  Essential hypertension Restart metoprolol--give as IV while patient  is on BiPAP     Family Communication:   son updated 9/9  Consultants:cardiology, pulm    Code  Status:  FULL   DVT Prophylaxis:  IV Heparin   Procedures: As Listed in Progress Note Above  Antibiotics: Vanc 9/8>> Aztreonam 9/8>> Azithro 9/8>>    The patient is critically ill with multiple organ systems failure and requires high complexity decision making for assessment and support, frequent evaluation and titration of therapies, application of advanced monitoring technologies and extensive interpretation of multiple databases.  Critical care time - 40 mins.    Subjective: Patient complains of sob, a little better.  Denies f/c, cp, n/v/d, abd pain  Objective: Vitals:   05/30/22 1400 05/30/22 1500 05/30/22 1535 05/30/22 1624  BP: 131/73 105/67    Pulse:      Resp: 18 16 13 16   Temp:    98.4 F (36.9 C)  TempSrc:    Axillary  SpO2:   91%   Weight:      Height:        Intake/Output Summary (Last 24 hours) at 05/30/2022 1645 Last data filed at 05/29/2022 1657 Gross per 24 hour  Intake 82.84 ml  Output --  Net 82.84 ml   Weight change:  Exam:  General:  Pt is alert, follows commands appropriately, not in acute distress HEENT: No icterus, No thrush, No neck mass, [place]/AT Cardiovascular: RRR, S1/S2, no rubs, no gallops Respiratory: scattered bilateral rales.  Bilateral wheeze Abdomen: Soft/+BS, non tender, non distended, no guarding Extremities: trace LE edema, No lymphangitis, No petechiae, No rashes, no synovitis   Data Reviewed: I have personally reviewed following labs and imaging studies Basic Metabolic Panel: Recent Labs  Lab 05/28/22 1640 05/29/22 0438 05/30/22 0525  NA 137 141 142  K 4.3 4.3 5.6*  CL 109 114* 118*  CO2 22 21* 19*  GLUCOSE 120* 91 193*  BUN 17 13 19   CREATININE 1.08* 1.03* 1.22*  CALCIUM 8.6* 8.3* 8.7*   Liver Function Tests: Recent Labs  Lab 05/28/22 1640  AST 39  ALT 32  ALKPHOS 107  BILITOT 0.6  PROT 7.5  ALBUMIN 3.3*   No results for input(s): "LIPASE", "AMYLASE" in the last 168 hours. No results for input(s):  "AMMONIA" in the last 168 hours. Coagulation Profile: No results for input(s): "INR", "PROTIME" in the last 168 hours. CBC: Recent Labs  Lab 05/28/22 1640 05/29/22 0438 05/30/22 0525  WBC 6.3 5.6 5.7  NEUTROABS 3.7  --   --   HGB 8.6* 8.3* 9.2*  HCT 28.5* 27.4* 31.3*  MCV 115.4* 113.7* 117.7*  PLT 341 303 349   Cardiac Enzymes: No results for input(s): "CKTOTAL", "CKMB", "CKMBINDEX", "TROPONINI" in the last 168 hours. BNP: Invalid input(s): "POCBNP" CBG: No results for input(s): "GLUCAP" in the last 168 hours. HbA1C: No results for input(s): "HGBA1C" in the last 72 hours. Urine analysis:    Component Value Date/Time   COLORURINE YELLOW 05/02/2022 0711   APPEARANCEUR HAZY (A) 05/02/2022 0711   LABSPEC 1.014 05/02/2022 0711   PHURINE 6.0 05/02/2022 0711   GLUCOSEU NEGATIVE 05/02/2022 0711   HGBUR SMALL (A) 05/02/2022 0711   BILIRUBINUR NEGATIVE 05/02/2022 0711   KETONESUR NEGATIVE 05/02/2022 0711   PROTEINUR >=300 (A) 05/02/2022 0711   UROBILINOGEN 1.0 07/16/2014 0244   NITRITE NEGATIVE 05/02/2022 0711   LEUKOCYTESUR NEGATIVE 05/02/2022 0711   Sepsis Labs: @LABRCNTIP (procalcitonin:4,lacticidven:4) ) Recent Results (from the past 240 hour(s))  Resp Panel by RT-PCR (Flu A&B, Covid) Anterior Nasal Swab  Status: None   Collection Time: 05/28/22  4:27 PM   Specimen: Anterior Nasal Swab  Result Value Ref Range Status   SARS Coronavirus 2 by RT PCR NEGATIVE NEGATIVE Final    Comment: (NOTE) SARS-CoV-2 target nucleic acids are NOT DETECTED.  The SARS-CoV-2 RNA is generally detectable in upper respiratory specimens during the acute phase of infection. The lowest concentration of SARS-CoV-2 viral copies this assay can detect is 138 copies/mL. A negative result does not preclude SARS-Cov-2 infection and should not be used as the sole basis for treatment or other patient management decisions. A negative result may occur with  improper specimen collection/handling,  submission of specimen other than nasopharyngeal swab, presence of viral mutation(s) within the areas targeted by this assay, and inadequate number of viral copies(<138 copies/mL). A negative result must be combined with clinical observations, patient history, and epidemiological information. The expected result is Negative.  Fact Sheet for Patients:  BloggerCourse.com  Fact Sheet for Healthcare Providers:  SeriousBroker.it  This test is no t yet approved or cleared by the [place] FDA and  has been authorized for detection and/or diagnosis of SARS-CoV-2 by FDA under an Emergency Use Authorization (EUA). This EUA will remain  in effect (meaning this test can be used) for the duration of the COVID-19 declaration under Section 564(b)(1) of the Act, 21 U.S.C.section 360bbb-3(b)(1), unless the authorization is terminated  or revoked sooner.       Influenza A by PCR NEGATIVE NEGATIVE Final   Influenza B by PCR NEGATIVE NEGATIVE Final    Comment: (NOTE) The Xpert Xpress SARS-CoV-2/FLU/RSV plus assay is intended as an aid in the diagnosis of influenza from Nasopharyngeal swab specimens and should not be used as a sole basis for treatment. Nasal washings and aspirates are unacceptable for Xpert Xpress SARS-CoV-2/FLU/RSV testing.  Fact Sheet for Patients: BloggerCourse.com  Fact Sheet for Healthcare Providers: SeriousBroker.it  This test is not yet approved or cleared by the [place] FDA and has been authorized for detection and/or diagnosis of SARS-CoV-2 by FDA under an Emergency Use Authorization (EUA). This EUA will remain in effect (meaning this test can be used) for the duration of the COVID-19 declaration under Section 564(b)(1) of the Act, 21 U.S.C. section 360bbb-3(b)(1), unless the authorization is terminated or revoked.  Performed at [facility], [address] [place], [place] [address]   MRSA Next Gen by PCR, Nasal     Status: Abnormal   Collection Time: 05/29/22  2:27 AM   Specimen: Nasal Mucosa; Nasal Swab  Result Value Ref Range Status   MRSA by PCR Next Gen DETECTED (A) NOT DETECTED Final    Comment: RESULT CALLED TO, READ BACK BY AND VERIFIED WITH: MILLS M @ 1106 ON [id number] BY HENDERSON L (NOTE) The GeneXpert MRSA Assay (FDA approved for NASAL specimens only), is one component of a comprehensive MRSA colonization surveillance program. It is not intended to diagnose MRSA infection nor to guide or monitor treatment for MRSA infections. Test performance is not FDA approved in patients less than 30 years old. Performed at [facility], [address], [place], [place] [address]      Scheduled Meds:  (feeding supplement) PROSource Plus  30 mL Oral TID BM   arformoterol  15 mcg Nebulization BID   budesonide (PULMICORT) nebulizer solution  0.5 mg Nebulization BID   Chlorhexidine Gluconate Cloth  6 each Topical Q0600   feeding supplement  1 Container Oral TID BM   methylPREDNISolone (SOLU-MEDROL) injection  40  mg Intravenous Q12H   metoprolol tartrate  7.5 mg Intravenous Q6H   mupirocin ointment   Nasal BID   mouth rinse  15 mL Mouth Rinse 4 times per day   pantoprazole (PROTONIX) IV  40 mg Intravenous Q12H   revefenacin  175 mcg Nebulization Daily   Continuous Infusions:  sodium chloride 100 mL/hr at 05/29/22 1657   azithromycin 500 mg (05/30/22 1507)   aztreonam 1 g (05/30/22 0534)   heparin 700 Units/hr (05/30/22 0530)   vancomycin Stopped (05/30/22 1505)    Procedures/Studies: DG CHEST PORT 1 VIEW  Result Date: 05/30/2022 CLINICAL DATA:  Respiratory failure, pneumonia EXAM: PORTABLE CHEST 1 VIEW COMPARISON:  Previous studies including chest radiograph done on 05/28/2022 and CT done on 05/29/2022 FINDINGS: There is diffuse increase in patchy interstitial and alveolar densities in both lungs, more so on the left side. There is  relative sparing of right upper lung field due to underlying bullous emphysema. There is blunting of both lateral CP angles. There is no pneumothorax. IMPRESSION: There is diffuse increase in interstitial and alveolar markings in both lungs, more so on the left side. Findings suggest asymmetric pulmonary edema or worsening multifocal bilateral pneumonia. Small bilateral pleural effusions. Electronically Signed   By: Elmer Picker M.D.   On: 05/30/2022 10:05   ECHOCARDIOGRAM COMPLETE  Result Date: 05/29/2022    ECHOCARDIOGRAM REPORT   [name] Date of Exam: 05/29/2022 Medical Rec #:  [id number]         Height:       60.0 in Accession #:    [id number]        Weight:       88.2 lb Date of Birth:  1951/08/27         BSA:          1.318 m Patient Age:    65 years          BP:           143/66 mmHg Patient Gender: F                 HR:           113 bpm. Exam Location:  [facility] Procedure: 2D Echo, Cardiac Doppler and Color Doppler Indications:    Elevated Troponin  History:        Patient has prior history of Echocardiogram examinations, most                 recent 04/14/2018. CAD and Previous Myocardial Infarction, COPD                 and Stroke; Risk Factors:Hypertension and Dyslipidemia. Hx of                 CKD, COVID-10.  Sonographer:    Alvino Chapel RCS Referring Phys: [phone] Pepe Mineau  Sonographer Comments: ** Patient on BiPAP and moving Alot during echo. IMPRESSIONS  1. Left ventricular ejection fraction, by estimation, is 35 to 40%. The left ventricle has moderately decreased function. The left ventricle demonstrates regional wall motion abnormalities (see scoring diagram/findings for description). Left ventricular  diastolic parameters are indeterminate.  2. Right ventricular systolic function is mildly reduced. The right ventricular size is normal. There is moderately elevated pulmonary artery systolic pressure. The estimated right ventricular systolic pressure is 99991111 mmHg.   3. The mitral valve is degenerative. Trivial mitral valve regurgitation.  4. The aortic valve was not well visualized. Aortic  valve regurgitation is not visualized. No aortic stenosis is present.  5. The inferior vena cava is dilated in size with <50% respiratory variability, suggesting right atrial pressure of 15 mmHg. Conclusion(s)/Recommendation(s): Normal systolic function at base with apical akinesis, differential includes LAD infarct vs stress induced cardiomyopathy. FINDINGS  Left Ventricle: Left ventricular ejection fraction, by estimation, is 35 to 40%. The left ventricle has moderately decreased function. The left ventricle demonstrates regional wall motion abnormalities. The left ventricular internal cavity size was normal in size. There is no left ventricular hypertrophy. Left ventricular diastolic parameters are indeterminate.  LV Wall Scoring: The mid and distal anterior septum, entire apex, and mid inferoseptal segment are akinetic. The anterior wall, antero-lateral wall, inferior wall, posterior wall, basal anteroseptal segment, and basal inferoseptal segment are normal. Right Ventricle: The right ventricular size is normal. Right vetricular wall thickness was not well visualized. Right ventricular systolic function is mildly reduced. There is moderately elevated pulmonary artery systolic pressure. The tricuspid regurgitant velocity is 3.00 m/s, and with an assumed right atrial pressure of 15 mmHg, the estimated right ventricular systolic pressure is 99991111 mmHg. Left Atrium: Left atrial size was normal in size. Right Atrium: Right atrial size was normal in size. Pericardium: There is no evidence of pericardial effusion. Mitral Valve: The mitral valve is degenerative in appearance. Trivial mitral valve regurgitation. Tricuspid Valve: The tricuspid valve is normal in structure. Tricuspid valve regurgitation is trivial. Aortic Valve: The aortic valve was not well visualized. Aortic valve regurgitation is  not visualized. No aortic stenosis is present. Pulmonic Valve: The pulmonic valve was not well visualized. Pulmonic valve regurgitation is not visualized. Aorta: The aortic root is normal in size and structure. Venous: The inferior vena cava is dilated in size with less than 50% respiratory variability, suggesting right atrial pressure of 15 mmHg. IAS/Shunts: The interatrial septum was not well visualized.  LEFT VENTRICLE PLAX 2D LVIDd:         3.60 cm LVIDs:         2.50 cm LV PW:         0.90 cm LV IVS:        0.90 cm LVOT diam:     1.70 cm LV SV:         37 LV SV Index:   28 LVOT Area:     2.27 cm  LV Volumes (MOD) LV vol d, MOD A2C: 51.2 ml LV vol d, MOD A4C: 57.0 ml LV vol s, MOD A2C: 30.1 ml LV vol s, MOD A4C: 32.9 ml LV SV MOD A2C:     21.1 ml LV SV MOD A4C:     57.0 ml LV SV MOD BP:      24.6 ml RIGHT VENTRICLE TAPSE (M-mode): 1.2 cm LEFT ATRIUM             Index        RIGHT ATRIUM           Index LA diam:        2.60 cm 1.97 cm/m   RA Area:     10.40 cm LA Vol (A2C):   32.4 ml 24.58 ml/m  RA Volume:   25.30 ml  19.20 ml/m LA Vol (A4C):   32.5 ml 24.66 ml/m LA Biplane Vol: 35.2 ml 26.71 ml/m  AORTIC VALVE LVOT Vmax:   88.30 cm/s LVOT Vmean:  55.300 cm/s LVOT VTI:    0.164 m  AORTA Ao Root diam: 2.50 cm MITRAL VALVE  TRICUSPID VALVE MV Area (PHT): 7.16 cm     TR Peak grad:   36.0 mmHg MV Decel Time: 106 msec     TR Vmax:        300.00 cm/s MV E velocity: 136.00 cm/s                             SHUNTS                             Systemic VTI:  0.16 m                             Systemic Diam: 1.70 cm Oswaldo Milian MD Electronically signed by Oswaldo Milian MD Signature Date/Time: 05/29/2022/2:52:39 PM    Final    CT Angio Chest Pulmonary Embolism (PE) W or WO Contrast  Result Date: 05/29/2022 CLINICAL DATA:  Shortness of breath x2 weeks EXAM: CT ANGIOGRAPHY CHEST WITH CONTRAST TECHNIQUE: Multidetector CT imaging of the chest was performed using the standard protocol during  bolus administration of intravenous contrast. Multiplanar CT image reconstructions and MIPs were obtained to evaluate the vascular anatomy. RADIATION DOSE REDUCTION: This exam was performed according to the departmental dose-optimization program which includes automated exposure control, adjustment of the mA and/or kV according to patient size and/or use of iterative reconstruction technique. CONTRAST:  62mL OMNIPAQUE IOHEXOL 350 MG/ML SOLN COMPARISON:  Previous studies including the chest radiograph done on 05/28/2022 FINDINGS: Cardiovascular: There is homogeneous enhancement in thoracic aorta. There are no intraluminal filling defects in central pulmonary artery branches. There are scattered coronary artery calcifications. Mediastinum/Nodes: No significant lymphadenopathy seen. There are small pockets of air in right subclavian vein, possibly introduced during venipuncture. Lungs/Pleura: Centrilobular and panlobular emphysema is seen. 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 There is small patchy infiltrate in the lateral aspect of posterior segment of right upper lobe. There is thickening of interlobular septi in the lower lung fields. There are patchy infiltrates in right middle lobe and both lower lobes. Small right pleural effusion is seen. There is minimal left pleural effusion. There is no pneumothorax. Upper Abdomen: There is 5.1 cm smooth marginated fluid density lesion in the upper pole of left kidney suggesting possible cyst. No follow-up is recommended. There are a few low-density lesions in liver measuring up to 11 mm in size which are not fully characterized, most likely cysts. No follow-up imaging is recommended. Musculoskeletal: No acute findings are seen. Review of the MIP images confirms the above findings. IMPRESSION: There is no evidence of pulmonary artery embolism. There is no evidence of thoracic aortic dissection. Scattered coronary artery calcifications are seen. Severe COPD.  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 There are small patchy infiltrates in posterior segment of right upper lobe, right middle lobe and both lower lobes suggesting multifocal atelectasis/pneumonia. Bilateral pleural effusions, more so on the right side. There are low-density lesions in left kidney and liver, possibly cysts. No follow-up imaging is recommended. Electronically Signed   By: Elmer Picker M.D.   On: 05/29/2022 13:04   DG Chest Portable 1 View  Result Date: 05/28/2022 CLINICAL DATA:  Shortness of breath for 2 weeks.  COVID positive. EXAM: PORTABLE CHEST 1 VIEW COMPARISON:  05/02/2022 FINDINGS: Lungs are hyperinflated. Significant bullous changes at the RIGHT lung apex, similar to prior. There are no focal consolidations  or pleural effusions. No pulmonary edema. IMPRESSION: Hyperinflation of the lungs. No evidence for acute pulmonary abnormality. Electronically Signed   By: Norva Pavlov M.D.   On: 05/28/2022 17:12   DG Chest 2 View  Result Date: 05/02/2022 CLINICAL DATA:  71 year old female with history of abdominal pain, vomiting and diarrhea. EXAM: CHEST - 2 VIEW COMPARISON:  Chest x-ray 10/09/2021. FINDINGS: Lung volumes are increased with diffuse emphysematous changes. No consolidative airspace disease. No pleural effusions. No pneumothorax. No pulmonary nodule or mass noted. Pulmonary vasculature and the cardiomediastinal silhouette are within normal limits. Atherosclerosis in the thoracic aorta. IMPRESSION: 1. No radiographic evidence of acute cardiopulmonary disease. 2. Emphysema. 3. Aortic atherosclerosis. Electronically Signed   By: Trudie Reed M.D.   On: 05/02/2022 08:51   CT ABDOMEN PELVIS WO CONTRAST  Result Date: 05/02/2022 CLINICAL DATA:  71 year old female with history of acute onset of nonlocalized abdominal pain, vomiting and diarrhea for the past 3 days. EXAM: CT ABDOMEN AND PELVIS WITHOUT CONTRAST TECHNIQUE: Multidetector CT imaging of the abdomen  and pelvis was performed following the standard protocol without IV contrast. RADIATION DOSE REDUCTION: This exam was performed according to the departmental dose-optimization program which includes automated exposure control, adjustment of the mA and/or kV according to patient size and/or use of iterative reconstruction technique. COMPARISON:  CT of the abdomen and pelvis 10/09/2021. FINDINGS: Lower chest: Diffuse bronchial wall thickening and emphysematous changes in the lung bases bilaterally. Small right-sided Bochdalek's hernia. Scarring in the lung bases bilaterally. Atherosclerotic calcifications in the descending thoracic aorta as well as the left anterior descending, left circumflex and right coronary arteries. Hepatobiliary: There are low-attenuation lesions in the liver, incompletely characterized on today's noncontrast CT examination, previously characterized as simple cysts on prior abdominal MRI 10/14/2021 (no imaging follow-up is recommended), measuring up to 1.3 cm in segment 4A. No other suspicious appearing hepatic lesions are noted. Pancreas: No definite pancreatic mass or peripancreatic fluid collections or inflammatory changes are noted on today's noncontrast CT examination. Spleen: Unremarkable. Adrenals/Urinary Tract: Large low-attenuation lesion measuring 5.4 cm in the upper pole of the left kidney, and smaller 1.2 cm low-attenuation lesion in the interpolar region of the left kidney, incompletely characterized on today's noncontrast CT examination, but previously characterized as simple cysts on MRI 10/14/2021 (no imaging follow-up is recommended). Right kidney and bilateral adrenal glands are unremarkable in appearance. No hydroureteronephrosis. Urinary bladder is unremarkable in appearance. Stomach/Bowel: The appearance of the stomach is normal. There is no pathologic dilatation of small bowel or colon. There is profound mural thickening and extensive surrounding inflammatory changes  associated with the mid and distal transverse colon, concerning for severe acute colitis. The appendix is not confidently identified and may be surgically absent. Regardless, there are no inflammatory changes noted adjacent to the cecum to suggest the presence of an acute appendicitis at this time. Vascular/Lymphatic: Atherosclerotic calcifications throughout the abdominal aorta and pelvic vasculature. No definite lymphadenopathy confidently identified in the abdomen or pelvis on today's noncontrast CT examination. Reproductive: Status post hysterectomy. Ovaries are not confidently identified may be surgically absent or atrophic. Other: No significant volume of ascites.  No pneumoperitoneum. Musculoskeletal: There are no aggressive appearing lytic or blastic lesions noted in the visualized portions of the skeleton. IMPRESSION: 1. Findings suggest severe acute colitis, predominantly involving the mid to distal transverse colon. 2. Aortic atherosclerosis, in addition to at least three-vessel coronary artery disease. Assessment for potential risk factor modification, dietary therapy or pharmacologic therapy may be warranted, if clinically indicated.  3. Emphysema. 4. Additional incidental findings, as above. Electronically Signed   By: Vinnie Langton M.D.   On: 05/02/2022 08:50    Orson Eva, DO  [facility]  If 7PM-7AM, please contact night-coverage [email or web address] Password [email or web address] 05/30/2022, 4:45 PM   LOS: 2 days

## 2022-05-30 NOTE — Assessment & Plan Note (Addendum)
-  Advance diet -Count calories intake -Follow response.

## 2022-05-30 NOTE — ED Provider Notes (Signed)
I was called by the hospitalist to intubate this patient.  She was given 20 of etomidate and 120 of succinylcholine.Marland Kitchen  She was intubated using the glide scope without difficulty.  Patient was intubated on first try with a 7.5 tube.  The ET tube was confirmed in the correct place by visualization with the glide scope along with auscultation both sides and CO2 color change.   Bethann Berkshire, MD 05/30/22 [phone]

## 2022-05-30 NOTE — Progress Notes (Addendum)
Responded to nursing call:  hypoxia About one hour prior to current episode, pt was agitated and given ativan 0.5 mg with good effect and pt did well back on bipap with saturation 92-93% on fiO2 0.5. She desaturated down to 50s. And fiO2 increased to 100 percent.     Subjective: Pt somnolent but awakens and is agitated.  ROS not possible due to extremis  Vitals:   05/30/22 1830 05/30/22 1833 05/30/22 1838 05/30/22 1840  BP: 129/60  (!) 120/59 (!) 120/59  Pulse: (!) 110 (!) 108 (!) 106 (!) 105  Resp: 13 12 11 11   Temp:      TempSrc:      SpO2: (!) 81% 91% (!) 85% (!) 79%  Weight:      Height:       CV--RRR Lung--bilateral crackles. Abd--soft+BS/NT   Assessment/Plan: Acute Respiratory failure --ABG 7.16/49/243/17 on 100% --pt remains agitated and having distress despite ativan earlier --updated son>>agrees for intubation --repeat labs BMP and CBC --give lasix IV x 1 --stat CXR--personally reviewed--increased interstitial markings, small bilateral pleural effusions --sign out given to Dr. 10-06-1987 for night shit --son updated after intubation--confirms full code     Thomes Dinning, DO [facility]

## 2022-05-30 NOTE — Progress Notes (Signed)
At 1738 nurse in room with patient. Heart rate 150 and patient awake and pulling at mask. Took off BiPap to allow patient break and to provide oral care. Patient became very agitated and confused.Oriented x1. Bipap replaced at 1740.  Would not follow commands. MD notified at 1740 of heart rate and change and then notified at 1746 of notable respiratory distress. Dr Tat at bedside with nurse. Finis Bud, Hattie Perch, and [facility] ICU technician at bedside as well to assist with patient care. Ativan administered at 1800 per order. Patient resting comfortably after this with spo2 96-99%. At 1830 patient noted to have SpO2 57%. Virginia Rochester, RN and Sharlet Salina, RN at bedside to assist with patient. Jiles Garter, RN at bedside. Patient very difficult to arouse and spo2 verified with second probe. Dr Tat notified. Care link alerted and present. Erie Noe, RT in room, see respiratory documentation for details of BiPap adjustments. Abg obtained with ultrasound assistance and Dr Tat at bedside. Intubation ordered. Dr Estell Harpin in room. Intubated at 1906. Family updated by MD. OG placed at 1914. Placement verified with auscultation and xray. Report given to Alita Chyle, Rn

## 2022-05-31 ENCOUNTER — Inpatient Hospital Stay (HOSPITAL_COMMUNITY): Payer: Medicare Other

## 2022-05-31 ENCOUNTER — Other Ambulatory Visit (HOSPITAL_COMMUNITY): Payer: Self-pay | Admitting: *Deleted

## 2022-05-31 DIAGNOSIS — J441 Chronic obstructive pulmonary disease with (acute) exacerbation: Secondary | ICD-10-CM | POA: Diagnosis not present

## 2022-05-31 DIAGNOSIS — I5021 Acute systolic (congestive) heart failure: Secondary | ICD-10-CM | POA: Diagnosis not present

## 2022-05-31 DIAGNOSIS — J9601 Acute respiratory failure with hypoxia: Secondary | ICD-10-CM | POA: Diagnosis not present

## 2022-05-31 DIAGNOSIS — E875 Hyperkalemia: Secondary | ICD-10-CM

## 2022-05-31 DIAGNOSIS — N1831 Chronic kidney disease, stage 3a: Secondary | ICD-10-CM | POA: Diagnosis not present

## 2022-05-31 DIAGNOSIS — R778 Other specified abnormalities of plasma proteins: Secondary | ICD-10-CM | POA: Diagnosis not present

## 2022-05-31 LAB — BLOOD GAS, ARTERIAL
Acid-base deficit: 6.7 mmol/L — ABNORMAL HIGH (ref 0.0–2.0)
Acid-base deficit: 8 mmol/L — ABNORMAL HIGH (ref 0.0–2.0)
Acid-base deficit: 3.7 mmol/L — ABNORMAL HIGH (ref 0.0–2.0)
Bicarbonate: 22.2 mmol/L (ref 20.0–28.0)
Bicarbonate: 20.3 mmol/L (ref 20.0–28.0)
Bicarbonate: 22.7 mmol/L (ref 20.0–28.0)
Drawn by: 27407
Drawn by: 22179
Drawn by: 27407
FIO2: 50 %
FIO2: 50 %
FIO2: 40 %
O2 Saturation: 95.9 %
O2 Saturation: 98.4 %
O2 Saturation: 96.2 %
Patient temperature: 37
Patient temperature: 37.6
Patient temperature: 37.6
pCO2 arterial: 58 mmHg — ABNORMAL HIGH (ref 32–48)
pCO2 arterial: 53 mmHg — ABNORMAL HIGH (ref 32–48)
pCO2 arterial: 46 mmHg (ref 32–48)
pH, Arterial: 7.19 — CL (ref 7.35–7.45)
pH, Arterial: 7.19 — CL (ref 7.35–7.45)
pH, Arterial: 7.3 — ABNORMAL LOW (ref 7.35–7.45)
pO2, Arterial: 87 mmHg (ref 83–108)
pO2, Arterial: 107 mmHg (ref 83–108)
pO2, Arterial: 81 mmHg — ABNORMAL LOW (ref 83–108)

## 2022-05-31 LAB — BASIC METABOLIC PANEL
Anion gap: 6 (ref 5–15)
Anion gap: 7 (ref 5–15)
BUN: 29 mg/dL — ABNORMAL HIGH (ref 8–23)
BUN: 31 mg/dL — ABNORMAL HIGH (ref 8–23)
CO2: 22 mmol/L (ref 22–32)
CO2: 21 mmol/L — ABNORMAL LOW (ref 22–32)
Calcium: 8.6 mg/dL — ABNORMAL LOW (ref 8.9–10.3)
Calcium: 8.4 mg/dL — ABNORMAL LOW (ref 8.9–10.3)
Chloride: 117 mmol/L — ABNORMAL HIGH (ref 98–111)
Chloride: 117 mmol/L — ABNORMAL HIGH (ref 98–111)
Creatinine, Ser: 1.34 mg/dL — ABNORMAL HIGH (ref 0.44–1.00)
Creatinine, Ser: 1.4 mg/dL — ABNORMAL HIGH (ref 0.44–1.00)
GFR, Estimated: 42 mL/min — ABNORMAL LOW (ref >60)
GFR, Estimated: 40 mL/min — ABNORMAL LOW (ref >60)
Glucose, Bld: 137 mg/dL — ABNORMAL HIGH (ref 70–99)
Glucose, Bld: 142 mg/dL — ABNORMAL HIGH (ref 70–99)
Potassium: 5.6 mmol/L — ABNORMAL HIGH (ref 3.5–5.1)
Potassium: 5.2 mmol/L — ABNORMAL HIGH (ref 3.5–5.1)
Sodium: 145 mmol/L (ref 135–145)
Sodium: 145 mmol/L (ref 135–145)

## 2022-05-31 LAB — CBC
HCT: 32.9 % — ABNORMAL LOW (ref 36.0–46.0)
Hemoglobin: 9.6 g/dL — ABNORMAL LOW (ref 12.0–15.0)
MCH: 34.4 pg — ABNORMAL HIGH (ref 26.0–34.0)
MCHC: 29.2 g/dL — ABNORMAL LOW (ref 30.0–36.0)
MCV: 117.9 fL — ABNORMAL HIGH (ref 80.0–100.0)
Platelets: 251 10*3/uL (ref 150–400)
RBC: 2.79 MIL/uL — ABNORMAL LOW (ref 3.87–5.11)
RDW: 16.4 % — ABNORMAL HIGH (ref 11.5–15.5)
WBC: 8.3 10*3/uL (ref 4.0–10.5)
nRBC: 1.3 % — ABNORMAL HIGH (ref 0.0–0.2)

## 2022-05-31 LAB — ECHOCARDIOGRAM LIMITED
Calc EF: 46.3 %
Height: 60 in
S' Lateral: 3.3 cm
Single Plane A2C EF: 44.7 %
Single Plane A4C EF: 46.7 %
Weight: 1442.69 oz

## 2022-05-31 LAB — GLUCOSE, CAPILLARY
Glucose-Capillary: 113 mg/dL — ABNORMAL HIGH (ref 70–99)
Glucose-Capillary: 124 mg/dL — ABNORMAL HIGH (ref 70–99)
Glucose-Capillary: 129 mg/dL — ABNORMAL HIGH (ref 70–99)
Glucose-Capillary: 169 mg/dL — ABNORMAL HIGH (ref 70–99)
Glucose-Capillary: 184 mg/dL — ABNORMAL HIGH (ref 70–99)

## 2022-05-31 LAB — HEPARIN LEVEL (UNFRACTIONATED): Heparin Unfractionated: 0.39 IU/mL (ref 0.30–0.70)

## 2022-05-31 LAB — MAGNESIUM: Magnesium: 1.8 mg/dL (ref 1.7–2.4)

## 2022-05-31 LAB — PROCALCITONIN: Procalcitonin: 2.06 ng/mL

## 2022-05-31 MED ORDER — SODIUM ZIRCONIUM CYCLOSILICATE 10 G PO PACK
10.0000 g | PACK | Freq: Once | ORAL | Status: AC
  Administered 2022-05-31: 10 g via ORAL
  Filled 2022-05-31: qty 1

## 2022-05-31 MED ORDER — FREE WATER
200.0000 mL | Freq: Three times a day (TID) | Status: DC
Start: 2022-05-31 — End: 2022-06-02
  Administered 2022-05-31 – 2022-06-02 (×8): 200 mL

## 2022-05-31 MED ORDER — CLOPIDOGREL BISULFATE 75 MG PO TABS
75.0000 mg | ORAL_TABLET | Freq: Every day | ORAL | Status: DC
Start: 2022-05-31 — End: 2022-06-05
  Administered 2022-05-31 – 2022-06-03 (×4): 75 mg
  Filled 2022-05-31 (×4): qty 1

## 2022-05-31 MED ORDER — VITAL AF 1.2 CAL PO LIQD
1000.0000 mL | ORAL | Status: DC
  Administered 2022-05-31 – 2022-06-03 (×2): 1000 mL

## 2022-05-31 MED ORDER — PROSOURCE TF20 ENFIT COMPATIBL EN LIQD
60.0000 mL | Freq: Two times a day (BID) | ENTERAL | Status: DC
  Administered 2022-05-31 – 2022-06-03 (×8): 60 mL
  Filled 2022-05-31 (×9): qty 60

## 2022-05-31 MED ORDER — HEPARIN SODIUM (PORCINE) 5000 UNIT/ML IJ SOLN
5000.0000 [IU] | Freq: Three times a day (TID) | INTRAMUSCULAR | Status: DC
  Administered 2022-05-31 – 2022-06-11 (×31): 5000 [IU] via SUBCUTANEOUS
  Filled 2022-05-31 (×32): qty 1

## 2022-05-31 MED ORDER — ORAL CARE MOUTH RINSE
15.0000 mL | OROMUCOSAL | Status: DC
  Administered 2022-05-31 – 2022-06-04 (×48): 15 mL via OROMUCOSAL

## 2022-05-31 MED ORDER — ORAL CARE MOUTH RINSE: 15.0000 mL | OROMUCOSAL | Status: DC | PRN

## 2022-05-31 NOTE — Assessment & Plan Note (Addendum)
-  Given lokelma and now receiving intermittent dosages of diuretics. -Electrolytes stable currently -Continue telemetry monitoring and follow electrolytes trend. -Potassium down to 3.2; will replete and follow trend.

## 2022-05-31 NOTE — Progress Notes (Signed)
*  PRELIMINARY RESULTS* Echocardiogram Limited 2-D Echocardiogram  has been performed.  [name] 05/31/2022, 1:03 PM

## 2022-05-31 NOTE — Progress Notes (Signed)
ANTICOAGULATION CONSULT NOTE - follow-up  Pharmacy Consult:  Heparin Indication:  ACS  Allergies  Allergen Reactions   Penicillins Anaphylaxis   Aspirin Swelling and Other (See Comments)    Only in large doses will cause a reaction   Bee Venom Swelling    Severe swelling   Contrast Media [Iodinated Contrast Media] Swelling   Dilaudid [Hydromorphone Hcl] Itching   Lisinopril Swelling   Motrin [Ibuprofen] Swelling    Patient Measurements: Height: 5' (152.4 cm) Weight: 40.9 kg (90 lb 2.7 oz) IBW/kg (Calculated) : 45.5 Heparin Dosing Weight: 40 kg  Vital Signs: Temp: 99 F (37.2 C) (09/10 0734) Temp Source: Bladder (09/10 0734) BP: 130/54 (09/10 0600) Pulse Rate: 113 (09/10 0734)  Labs: Recent Labs    05/29/22 0438 05/29/22 0811 05/29/22 1012 05/29/22 1509 05/29/22 2346 05/30/22 0525 05/30/22 2002 05/31/22 0419 05/31/22 0645  HGB 8.3*  --   --   --   --  9.2* 9.3* 9.6*  --   HCT 27.4*  --   --   --   --  31.3* 31.2* 32.9*  --   PLT 303  --   --   --   --  349 278 251  --   HEPARINUNFRC 0.25*  --   --    < > 0.55 0.42  --  0.39  --   CREATININE 1.03*  --   --   --   --  1.22*  --  1.34* 1.40*  TROPONINIHS 478* 400* 368*  --   --   --   --   --   --    < > = values in this interval not displayed.     Estimated Creatinine Clearance: 23.5 mL/min (A) (by C-G formula based on SCr of 1.4 mg/dL (H)).   Medical History: Past Medical History:  Diagnosis Date   Anxiety    Blind    right   Colitis 09/07/2011   CVA (cerebral infarction)    GERD (gastroesophageal reflux disease)    Hyperlipidemia    Hypertension    Myocardial infarct (HCC)    1997, 2004   Severe major depression with psychotic features (HCC)    2004   Stroke [facility]) 2002    Assessment: 60 YOF presented with SOB in setting of recent Covid.  Pharmacy consulted to dose IV heparin for PE initially (ruled out), changed to possible MI requiring heparin gtt for 48 hours per cardiology.  Baseline labs  reviewed.  HL 0.39 therapeutic There have been no issues with the infusion or signs of bleeding according to the nurse providing care.   Goal of Therapy:  Heparin level 0.3-0.7 units/ml Monitor platelets by anticoagulation protocol: Yes   Plan:   Continue heparin gtt at 700 units/hr Daily heparin level and CBC  Gerre Pebbles Demetries Coia, PharmD, MBA Clinical Pharmacist  "Be curious, not judgmental..." -Debbora Dus

## 2022-05-31 NOTE — Progress Notes (Signed)
PROGRESS NOTE  [name] [id number] DOB: 03/30/1951 DOA: 05/28/2022 PCP: Carrolyn Meiers, MD  Brief History:  71 year old female with a history of coronary disease, hypertension, hyperlipidemia, functional constipation, B12 and folate deficiency, anxiety, and prior stroke presenting with shortness of breath and respiratory failure.  The patient was recently mid to the hospital from 05/02/2022 to 05/09/2022 when she had acute respiratory failure secondary to COVID-19.  During that hospitalization, the patient was also noted to have colitis for which she was treated with levofloxacin and metronidazole.  She completed a course of antibiotics prior to discharge.  She was discharged home with a prednisone taper. She states that she has had some shortness of breath since discharge from the hospital, but this has significantly worsened in the 24 hours prior to admission.  Upon arrival to the emergency department, the patient was noted to have respiratory distress patient was placed on BiPAP. Repeat ABG on BiPAP did not show significant improvement.  There was plans for intubation, but the patient was given Ativan with improvement clinically.  She remained on BiPAP overnight.  Notably, the patient states that she started having some burning chest discomfort on the morning of 05/28/2022.  She states that it somewhat improved while she was on BiPAP, but continues to have the chest discomfort.  She was taken off of BiPAP in the morning of 05/29/2022 and stated that she had some worsening of her burning chest discomfort.  She developed some increased shortness of breath resulting in placement back on BiPAP on the morning of 05/29/22.  Troponins were noted to be 375>> 478.  D-dimer was also noted to be elevated 3.97.  The patient was started on IV heparin. The patient was given Solu-Medrol 125 mg IV, morphine 1 mg IV and placed back on BiPAP.  Repeat EKG showed sinus tachycardia with T wave  inversion V2-V5.  Troponins continue to be cycled. cardiology was consulted to assist with management.   On the evening of 05/30/2022, the patient developed respiratory distress and hypoxia on BiPAP.  She remained agitated despite pharmacologic and nonpharmacologic interventions.  After discussion with the patient's family, decision was made to intubate the patient.  Repeat limited echocardiogram was ordered for 05/31/22.    Assessment and Plan: * Acute respiratory failure with hypoxia and hypercarbia (HCC) - Presented with respiratory distress with oxygen saturation 77% on room air -due to COPD exacerbation and pneumonia -Placed back on BiPAP 05/29/22 am -05/30/22 evening--agitation/resp distress>>intubated -CTA chest--neg PE, severe emphysema; multifocal patchy infiltrates RUL, RLL, LLL -increase RR on vent based upon ABG -personally reviewed CXR>>patchy interstitial L-lung opacities>R  COPD with acute exacerbation ([place]) Although the patient denies a history of COPD and has not had formal PFTs--> she endorses smoking over 30 years, 1/2 pack/day -Quit smoking 7 years ago -Continue Yupelri -Continue IV Solu-Medrol -Continue Pulmicort  Elevated troponin Suspect demand ischemia -Continue IV heparin x 48 hours total -Cardiology consult appreciated>>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 -Patient does complain of chest burning -Personally reviewed EKG--sinus rhythm, T wave depression V2-V5 -9/8 echo--EF 35-40%, apical HK, trivial MR/TR -9/10--repeat limited Echo -9/10 personally reviewed EKG--sinus TWI V4-V6 essentially unchanged  Lobar pneumonia (HCC) CTA chest as discussed above MRSA+ Continue vanc/aztreonam/azithro PCT 0.64 Obtain tracheal aspirate for culture  Hyperkalemia Give lokelma  Cardiomyopathy ([place]) -9/8 echo--EF 35-40%, apical HK, trivial MR/TR Discussed with cardiology>>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  Essential hypertension Restart metoprolol--give as IV while patient is on BiPAP   Family Communication:   son updated 9/10   Consultants:cardiology, pulm     Code Status:  FULL    DVT Prophylaxis:  IV Heparin     Procedures: As Listed in Progress Note Above   Antibiotics: Vanc 9/8>> Aztreonam 9/8>> Azithro 9/8>>       The patient is critically ill with multiple organ systems failure and requires high complexity decision making for assessment and support, frequent evaluation and titration of therapies, application of advanced monitoring technologies and extensive interpretation of multiple databases.  Critical care time - 40 mins.     Subjective: Patient intubated and sedated.  No distress.  No vomiting or diarrhea.  No acute events overnight.  ROS not possible due to sedation  Objective: Vitals:   05/31/22 0500 05/31/22 0545 05/31/22 0600 05/31/22 0734  BP: (!) 125/55  (!) 130/54   Pulse: (!) 113  (!) 108 (!) 113  Resp: 14  12 (!) 21  Temp:   99.3 F (37.4 C) 99 F (37.2 C)  TempSrc:    Bladder  SpO2: 100% 100% 100% 100%  Weight: 40.9 kg     Height:        Intake/Output Summary (Last 24 hours) at 05/31/2022 0800 Last data filed at 05/31/2022 0400 Gross per 24 hour  Intake 2633.42 ml  Output 2100 ml  Net 533.42 ml   Weight change:  Exam:  General:  Pt is sedated on vent. NAD HEENT: No icterus, No thrush, No neck mass, [place]/AT Cardiovascular: RRR, S1/S2, no rubs, no  gallops Respiratory: scattered bilateral crackles. No wheeze Abdomen: Soft/+BS, non tender, non distended, no guarding Extremities: No edema, No lymphangitis, No petechiae, No rashes, no synovitis   Data Reviewed: I have personally reviewed following labs and imaging studies Basic Metabolic Panel: Recent Labs  Lab 05/28/22 1640 05/29/22 0438 05/30/22 0525 05/31/22 0419 05/31/22 0645  NA 137 141 142 145 145  K 4.3 4.3 5.6* 5.6* 5.2*  CL 109 114* 118* 117* 117*  CO2 22 21* 19* 22 21*  GLUCOSE 120* 91 193* 137* 142*  BUN 17 13 19  29* 31*  CREATININE 1.08* 1.03* 1.22* 1.34* 1.40*  CALCIUM 8.6* 8.3* 8.7* 8.6* 8.4*  MG  --   --   --  1.8  --    Liver Function Tests: Recent Labs  Lab 05/28/22 1640  AST 39  ALT 32  ALKPHOS 107  BILITOT 0.6  PROT 7.5  ALBUMIN 3.3*   No results for input(s): "LIPASE", "AMYLASE" in the last 168 hours. No results for input(s): "AMMONIA" in the last 168 hours. Coagulation Profile: No results for input(s): "INR", "PROTIME" in the last 168 hours. CBC: Recent Labs  Lab 05/28/22 1640 05/29/22 0438 05/30/22 0525 05/30/22 2002 05/31/22 0419  WBC 6.3 5.6 5.7 10.3 8.3  NEUTROABS 3.7  --   --   --   --   HGB 8.6* 8.3* 9.2* 9.3* 9.6*  HCT 28.5* 27.4* 31.3* 31.2* 32.9*  MCV 115.4*  113.7* 117.7* 118.6* 117.9*  PLT 341 303 349 278 251   Cardiac Enzymes: No results for input(s): "CKTOTAL", "CKMB", "CKMBINDEX", "TROPONINI" in the last 168 hours. BNP: Invalid input(s): "POCBNP" CBG: Recent Labs  Lab 05/30/22 1745 05/31/22 0503 05/31/22 0731  GLUCAP 103* 129* 113*   HbA1C: No results for input(s): "HGBA1C" in the last 72 hours. Urine analysis:    Component Value Date/Time   COLORURINE YELLOW 05/02/2022 0711   APPEARANCEUR HAZY (A) 05/02/2022 0711   LABSPEC 1.014 05/02/2022 0711   PHURINE 6.0 05/02/2022 0711   GLUCOSEU NEGATIVE 05/02/2022 0711   HGBUR SMALL (A) 05/02/2022 0711   BILIRUBINUR NEGATIVE 05/02/2022 0711   KETONESUR  NEGATIVE 05/02/2022 0711   PROTEINUR >=300 (A) 05/02/2022 0711   UROBILINOGEN 1.0 07/16/2014 0244   NITRITE NEGATIVE 05/02/2022 0711   LEUKOCYTESUR NEGATIVE 05/02/2022 0711   Sepsis Labs: @LABRCNTIP (procalcitonin:4,lacticidven:4) ) Recent Results (from the past 240 hour(s))  Resp Panel by RT-PCR (Flu A&B, Covid) Anterior Nasal Swab     Status: None   Collection Time: 05/28/22  4:27 PM   Specimen: Anterior Nasal Swab  Result Value Ref Range Status   SARS Coronavirus 2 by RT PCR NEGATIVE NEGATIVE Final    Comment: (NOTE) SARS-CoV-2 target nucleic acids are NOT DETECTED.  The SARS-CoV-2 RNA is generally detectable in upper respiratory specimens during the acute phase of infection. The lowest concentration of SARS-CoV-2 viral copies this assay can detect is 138 copies/mL. A negative result does not preclude SARS-Cov-2 infection and should not be used as the sole basis for treatment or other patient management decisions. A negative result may occur with  improper specimen collection/handling, submission of specimen other than nasopharyngeal swab, presence of viral mutation(s) within the areas targeted by this assay, and inadequate number of viral copies(<138 copies/mL). A negative result must be combined with clinical observations, patient history, and epidemiological information. The expected result is Negative.  Fact Sheet for Patients:  EntrepreneurPulse.com.au  Fact Sheet for Healthcare Providers:  IncredibleEmployment.be  This test is no t yet approved or cleared by the [place] FDA and  has been authorized for detection and/or diagnosis of SARS-CoV-2 by FDA under an Emergency Use Authorization (EUA). This EUA will remain  in effect (meaning this test can be used) for the duration of the COVID-19 declaration under Section 564(b)(1) of the Act, 21 U.S.C.section 360bbb-3(b)(1), unless the authorization is terminated  or revoked sooner.        Influenza A by PCR NEGATIVE NEGATIVE Final   Influenza B by PCR NEGATIVE NEGATIVE Final    Comment: (NOTE) The Xpert Xpress SARS-CoV-2/FLU/RSV plus assay is intended as an aid in the diagnosis of influenza from Nasopharyngeal swab specimens and should not be used as a sole basis for treatment. Nasal washings and aspirates are unacceptable for Xpert Xpress SARS-CoV-2/FLU/RSV testing.  Fact Sheet for Patients: EntrepreneurPulse.com.au  Fact Sheet for Healthcare Providers: IncredibleEmployment.be  This test is not yet approved or cleared by the [place] FDA and has been authorized for detection and/or diagnosis of SARS-CoV-2 by FDA under an Emergency Use Authorization (EUA). This EUA will remain in effect (meaning this test can be used) for the duration of the COVID-19 declaration under Section 564(b)(1) of the Act, 21 U.S.C. section 360bbb-3(b)(1), unless the authorization is terminated or revoked.  Performed at [facility], [address], [place], [place] [address]   MRSA Next Gen by PCR, Nasal     Status: Abnormal   Collection Time: 05/29/22  2:27 AM  Specimen: Nasal Mucosa; Nasal Swab  Result Value Ref Range Status   MRSA by PCR Next Gen DETECTED (A) NOT DETECTED Final    Comment: RESULT CALLED TO, READ BACK BY AND VERIFIED WITH: MILLS M @ 1106 ON OP:[id number] BY HENDERSON L (NOTE) The GeneXpert MRSA Assay (FDA approved for NASAL specimens only), is one component of a comprehensive MRSA colonization surveillance program. It is not intended to diagnose MRSA infection nor to guide or monitor treatment for MRSA infections. Test performance is not FDA approved in patients less than 64 years old. Performed at [facility], [address], [place], [place] [address]      Scheduled Meds:  (feeding supplement) PROSource Plus  30 mL Oral TID BM   arformoterol  15 mcg Nebulization BID   budesonide (PULMICORT) nebulizer solution  0.5 mg  Nebulization BID   Chlorhexidine Gluconate Cloth  6 each Topical Q0600   free water  200 mL Per Tube Q8H   methylPREDNISolone (SOLU-MEDROL) injection  40 mg Intravenous Q12H   metoprolol tartrate  7.5 mg Intravenous Q6H   mupirocin ointment   Nasal BID   mouth rinse  15 mL Mouth Rinse Q2H   pantoprazole (PROTONIX) IV  40 mg Intravenous Q12H   polyethylene glycol  17 g Per Tube Daily   revefenacin  175 mcg Nebulization Daily   sodium zirconium cyclosilicate  10 g Oral Once   Continuous Infusions:  azithromycin Stopped (05/30/22 1608)   aztreonam 1 g (05/31/22 0550)   feeding supplement (VITAL AF 1.2 CAL)     fentaNYL infusion INTRAVENOUS 100 mcg/hr (05/31/22 0326)   heparin 700 Units/hr (05/31/22 0326)   vancomycin Stopped (05/30/22 1443)    Procedures/Studies: DG Chest 1 View  Result Date: 05/31/2022 CLINICAL DATA:  Shortness of breath EXAM: CHEST  1 VIEW COMPARISON:  05/30/2022 FINDINGS: Endotracheal tube terminates 7 cm above the carina. Multifocal patchy left lung opacities, favoring pneumonia, possibly on the basis of aspiration. Right lung is essentially clear. No pleural effusion or pneumothorax. The heart is normal in size. Enteric tube courses into the stomach. Cholecystectomy clips. IMPRESSION: Endotracheal tube terminates 7 cm above the carina. Multifocal patchy left lung opacities, favoring pneumonia, possibly on the basis of aspiration. Electronically Signed   By: Julian Hy M.D.   On: 05/31/2022 03:54   DG CHEST PORT 1 VIEW  Result Date: 05/30/2022 CLINICAL DATA:  Endotracheal and orogastric tube. EXAM: PORTABLE CHEST 1 VIEW COMPARISON:  Chest CT 05/29/2022.  Chest x-ray 05/30/2009 3. FINDINGS: Endotracheal tube tip is 5 cm above the carina. Enteric tube tip is in the mid stomach. There are increasing central interstitial and airspace opacities throughout the left lung. Severe emphysematous changes are again seen in the right upper lobe. There is no pleural effusion or  pneumothorax. No acute fractures are seen. Cardiomediastinal silhouette is within normal limits. IMPRESSION: 1. Endotracheal tube tip 5 cm above carina. 2. Enteric tube tip at the level of the mid stomach. 3. Increasing left lung interstitial and airspace opacities which may represent edema or infection. 4. Stable severe emphysema. Electronically Signed   By: Ronney Asters M.D.   On: 05/30/2022 19:50   DG CHEST PORT 1 VIEW  Result Date: 05/30/2022 CLINICAL DATA:  Respiratory failure, pneumonia EXAM: PORTABLE CHEST 1 VIEW COMPARISON:  Previous studies including chest radiograph done on 05/28/2022 and CT done on 05/29/2022 FINDINGS: There is diffuse increase in patchy interstitial and alveolar densities in both lungs, more so on the left side. There is  relative sparing of right upper lung field due to underlying bullous emphysema. There is blunting of both lateral CP angles. There is no pneumothorax. IMPRESSION: There is diffuse increase in interstitial and alveolar markings in both lungs, more so on the left side. Findings suggest asymmetric pulmonary edema or worsening multifocal bilateral pneumonia. Small bilateral pleural effusions. Electronically Signed   By: Elmer Picker M.D.   On: 05/30/2022 10:05   ECHOCARDIOGRAM COMPLETE  Result Date: 05/29/2022    ECHOCARDIOGRAM REPORT   Patient Name:   ALEYSHKA CIFUENTES Date of Exam: 05/29/2022 Medical Rec #:  [id number]         Height:       60.0 in Accession #:    [id number]        Weight:       88.2 lb Date of Birth:  [birth year]         BSA:          1.318 m Patient Age:    50 years          BP:           143/66 mmHg Patient Gender: F                 HR:           113 bpm. Exam Location:  [facility] Procedure: 2D Echo, Cardiac Doppler and Color Doppler Indications:    Elevated Troponin  History:        Patient has prior history of Echocardiogram examinations, most                 recent 04/14/2018. CAD and Previous Myocardial Infarction, COPD                 and  Stroke; Risk Factors:Hypertension and Dyslipidemia. Hx of                 CKD, COVID-10.  Sonographer:    Alvino Chapel RCS Referring Phys: [id number] Xolani Degracia  Sonographer Comments: ** Patient on BiPAP and moving Alot during echo. IMPRESSIONS  1. Left ventricular ejection fraction, by estimation, is 35 to 40%. The left ventricle has moderately decreased function. The left ventricle demonstrates regional wall motion abnormalities (see scoring diagram/findings for description). Left ventricular  diastolic parameters are indeterminate.  2. Right ventricular systolic function is mildly reduced. The right ventricular size is normal. There is moderately elevated pulmonary artery systolic pressure. The estimated right ventricular systolic pressure is [id number] mmHg.  3. The mitral valve is degenerative. Trivial mitral valve regurgitation.  4. The aortic valve was not well visualized. Aortic valve regurgitation is not visualized. No aortic stenosis is present.  5. The inferior vena cava is dilated in size with <50% respiratory variability, suggesting right atrial pressure of 15 mmHg. Conclusion(s)/Recommendation(s): Normal systolic function at base with apical akinesis, differential includes LAD infarct vs stress induced cardiomyopathy. FINDINGS  Left Ventricle: Left ventricular ejection fraction, by estimation, is 35 to 40%. The left ventricle has moderately decreased function. The left ventricle demonstrates regional wall motion abnormalities. The left ventricular internal cavity size was normal in size. There is no left ventricular hypertrophy. Left ventricular diastolic parameters are indeterminate.  LV Wall Scoring: The mid and distal anterior septum, entire apex, and mid inferoseptal segment are akinetic. The anterior wall, antero-lateral wall, inferior wall, posterior wall, basal anteroseptal segment, and basal inferoseptal segment are normal. Right Ventricle: The right ventricular size is normal. Right vetricular wall  thickness was not well visualized.  Right ventricular systolic function is mildly reduced. There is moderately elevated pulmonary artery systolic pressure. The tricuspid regurgitant velocity is 3.00 m/s, and with an assumed right atrial pressure of 15 mmHg, the estimated right ventricular systolic pressure is 51.0 mmHg. Left Atrium: Left atrial size was normal in size. Right Atrium: Right atrial size was normal in size. Pericardium: There is no evidence of pericardial effusion. Mitral Valve: The mitral valve is degenerative in appearance. Trivial mitral valve regurgitation. Tricuspid Valve: The tricuspid valve is normal in structure. Tricuspid valve regurgitation is trivial. Aortic Valve: The aortic valve was not well visualized. Aortic valve regurgitation is not visualized. No aortic stenosis is present. Pulmonic Valve: The pulmonic valve was not well visualized. Pulmonic valve regurgitation is not visualized. Aorta: The aortic root is normal in size and structure. Venous: The inferior vena cava is dilated in size with less than 50% respiratory variability, suggesting right atrial pressure of 15 mmHg. IAS/Shunts: The interatrial septum was not well visualized.  LEFT VENTRICLE PLAX 2D LVIDd:         3.60 cm LVIDs:         2.50 cm LV PW:         0.90 cm LV IVS:        0.90 cm LVOT diam:     1.70 cm LV SV:         37 LV SV Index:   28 LVOT Area:     2.27 cm  LV Volumes (MOD) LV vol d, MOD A2C: 51.2 ml LV vol d, MOD A4C: 57.0 ml LV vol s, MOD A2C: 30.1 ml LV vol s, MOD A4C: 32.9 ml LV SV MOD A2C:     21.1 ml LV SV MOD A4C:     57.0 ml LV SV MOD BP:      24.6 ml RIGHT VENTRICLE TAPSE (M-mode): 1.2 cm LEFT ATRIUM             Index        RIGHT ATRIUM           Index LA diam:        2.60 cm 1.97 cm/m   RA Area:     10.40 cm LA Vol (A2C):   32.4 ml 24.58 ml/m  RA Volume:   25.30 ml  19.20 ml/m LA Vol (A4C):   32.5 ml 24.66 ml/m LA Biplane Vol: 35.2 ml 26.71 ml/m  AORTIC VALVE LVOT Vmax:   88.30 cm/s LVOT Vmean:   55.300 cm/s LVOT VTI:    0.164 m  AORTA Ao Root diam: 2.50 cm MITRAL VALVE                TRICUSPID VALVE MV Area (PHT): 7.16 cm     TR Peak grad:   36.0 mmHg MV Decel Time: 106 msec     TR Vmax:        300.00 cm/s MV E velocity: 136.00 cm/s                             SHUNTS                             Systemic VTI:  0.16 m                             Systemic Diam: 1.70 cm Epifanio Lesches MD  Electronically signed by Oswaldo Milian MD Signature Date/Time: 05/29/2022/2:52:39 PM    Final    CT Angio Chest Pulmonary Embolism (PE) W or WO Contrast  Result Date: 05/29/2022 CLINICAL DATA:  Shortness of breath x2 weeks EXAM: CT ANGIOGRAPHY CHEST WITH CONTRAST TECHNIQUE: Multidetector CT imaging of the chest was performed using the standard protocol during bolus administration of intravenous contrast. Multiplanar CT image reconstructions and MIPs were obtained to evaluate the vascular anatomy. RADIATION DOSE REDUCTION: This exam was performed according to the departmental dose-optimization program which includes automated exposure control, adjustment of the mA and/or kV according to patient size and/or use of iterative reconstruction technique. CONTRAST:  78mL OMNIPAQUE IOHEXOL 350 MG/ML SOLN COMPARISON:  Previous studies including the chest radiograph done on 05/28/2022 FINDINGS: Cardiovascular: There is homogeneous enhancement in thoracic aorta. There are no intraluminal filling defects in central pulmonary artery branches. There are scattered coronary artery calcifications. Mediastinum/Nodes: No significant lymphadenopathy seen. There are small pockets of air in right subclavian vein, possibly introduced during venipuncture. Lungs/Pleura: Centrilobular and panlobular emphysema is seen. 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 There is small patchy infiltrate in the lateral aspect of posterior segment of right upper lobe. There is thickening of interlobular septi in the lower lung fields. There are  patchy infiltrates in right middle lobe and both lower lobes. Small right pleural effusion is seen. There is minimal left pleural effusion. There is no pneumothorax. Upper Abdomen: There is 5.1 cm smooth marginated fluid density lesion in the upper pole of left kidney suggesting possible cyst. No follow-up is recommended. There are a few low-density lesions in liver measuring up to 11 mm in size which are not fully characterized, most likely cysts. No follow-up imaging is recommended. Musculoskeletal: No acute findings are seen. Review of the MIP images confirms the above findings. IMPRESSION: There is no evidence of pulmonary artery embolism. There is no evidence of thoracic aortic dissection. Scattered coronary artery calcifications are seen. 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 There are small patchy infiltrates in posterior segment of right upper lobe, right middle lobe and both lower lobes suggesting multifocal atelectasis/pneumonia. Bilateral pleural effusions, more so on the right side. There are low-density lesions in left kidney and liver, possibly cysts. No follow-up imaging is recommended. Electronically Signed   By: Elmer Picker M.D.   On: 05/29/2022 13:04   DG Chest Portable 1 View  Result Date: 05/28/2022 CLINICAL DATA:  Shortness of breath for 2 weeks.  COVID positive. EXAM: PORTABLE CHEST 1 VIEW COMPARISON:  05/02/2022 FINDINGS: Lungs are hyperinflated. Significant bullous changes at the RIGHT lung apex, similar to prior. There are no focal consolidations or pleural effusions. No pulmonary edema. IMPRESSION: Hyperinflation of the lungs. No evidence for acute pulmonary abnormality. Electronically Signed   By: Nolon Nations M.D.   On: 05/28/2022 17:12   DG Chest 2 View  Result Date: 05/02/2022 CLINICAL DATA:  71 year old female with history of abdominal pain, vomiting and diarrhea. EXAM: CHEST - 2 VIEW COMPARISON:  Chest x-ray 10/09/2021. FINDINGS: Lung  volumes are increased with diffuse emphysematous changes. No consolidative airspace disease. No pleural effusions. No pneumothorax. No pulmonary nodule or mass noted. Pulmonary vasculature and the cardiomediastinal silhouette are within normal limits. Atherosclerosis in the thoracic aorta. IMPRESSION: 1. No radiographic evidence of acute cardiopulmonary disease. 2. Emphysema. 3. Aortic atherosclerosis. Electronically Signed   By: Vinnie Langton M.D.   On: 05/02/2022 08:51   CT ABDOMEN PELVIS WO CONTRAST  Result  Date: 05/02/2022 CLINICAL DATA:  71 year old female with history of acute onset of nonlocalized abdominal pain, vomiting and diarrhea for the past 3 days. EXAM: CT ABDOMEN AND PELVIS WITHOUT CONTRAST TECHNIQUE: Multidetector CT imaging of the abdomen and pelvis was performed following the standard protocol without IV contrast. RADIATION DOSE REDUCTION: This exam was performed according to the departmental dose-optimization program which includes automated exposure control, adjustment of the mA and/or kV according to patient size and/or use of iterative reconstruction technique. COMPARISON:  CT of the abdomen and pelvis 10/09/2021. FINDINGS: Lower chest: Diffuse bronchial wall thickening and emphysematous changes in the lung bases bilaterally. Small right-sided Bochdalek's hernia. Scarring in the lung bases bilaterally. Atherosclerotic calcifications in the descending thoracic aorta as well as the left anterior descending, left circumflex and right coronary arteries. Hepatobiliary: There are low-attenuation lesions in the liver, incompletely characterized on today's noncontrast CT examination, previously characterized as simple cysts on prior abdominal MRI 10/14/2021 (no imaging follow-up is recommended), measuring up to 1.3 cm in segment 4A. No other suspicious appearing hepatic lesions are noted. Pancreas: No definite pancreatic mass or peripancreatic fluid collections or inflammatory changes are noted  on today's noncontrast CT examination. Spleen: Unremarkable. Adrenals/Urinary Tract: Large low-attenuation lesion measuring 5.4 cm in the upper pole of the left kidney, and smaller 1.2 cm low-attenuation lesion in the interpolar region of the left kidney, incompletely characterized on today's noncontrast CT examination, but previously characterized as simple cysts on MRI 10/14/2021 (no imaging follow-up is recommended). Right kidney and bilateral adrenal glands are unremarkable in appearance. No hydroureteronephrosis. Urinary bladder is unremarkable in appearance. Stomach/Bowel: The appearance of the stomach is normal. There is no pathologic dilatation of small bowel or colon. There is profound mural thickening and extensive surrounding inflammatory changes associated with the mid and distal transverse colon, concerning for severe acute colitis. The appendix is not confidently identified and may be surgically absent. Regardless, there are no inflammatory changes noted adjacent to the cecum to suggest the presence of an acute appendicitis at this time. Vascular/Lymphatic: Atherosclerotic calcifications throughout the abdominal aorta and pelvic vasculature. No definite lymphadenopathy confidently identified in the abdomen or pelvis on today's noncontrast CT examination. Reproductive: Status post hysterectomy. Ovaries are not confidently identified may be surgically absent or atrophic. Other: No significant volume of ascites.  No pneumoperitoneum. Musculoskeletal: There are no aggressive appearing lytic or blastic lesions noted in the visualized portions of the skeleton. IMPRESSION: 1. Findings suggest severe acute colitis, predominantly involving the mid to distal transverse colon. 2. Aortic atherosclerosis, in addition to at least three-vessel coronary artery disease. Assessment for potential risk factor modification, dietary therapy or pharmacologic therapy may be warranted, if clinically indicated. 3. Emphysema. 4.  Additional incidental findings, as above. Electronically Signed   By: Vinnie Langton M.D.   On: 05/02/2022 08:50    Orson Eva, DO  [facility]  If 7PM-7AM, please contact night-coverage [email or web address] Password [email or web address] 05/31/2022, 8:00 AM   LOS: 3 days

## 2022-05-31 NOTE — Progress Notes (Signed)
Checked residual before starting tube feeding 10 cc's

## 2022-06-01 ENCOUNTER — Inpatient Hospital Stay (HOSPITAL_COMMUNITY): Payer: Medicare Other

## 2022-06-01 DIAGNOSIS — I428 Other cardiomyopathies: Secondary | ICD-10-CM | POA: Diagnosis not present

## 2022-06-01 DIAGNOSIS — J9601 Acute respiratory failure with hypoxia: Secondary | ICD-10-CM | POA: Diagnosis not present

## 2022-06-01 DIAGNOSIS — N1831 Chronic kidney disease, stage 3a: Secondary | ICD-10-CM | POA: Diagnosis not present

## 2022-06-01 DIAGNOSIS — D649 Anemia, unspecified: Secondary | ICD-10-CM

## 2022-06-01 DIAGNOSIS — I429 Cardiomyopathy, unspecified: Secondary | ICD-10-CM | POA: Diagnosis not present

## 2022-06-01 DIAGNOSIS — J441 Chronic obstructive pulmonary disease with (acute) exacerbation: Secondary | ICD-10-CM | POA: Diagnosis not present

## 2022-06-01 LAB — BASIC METABOLIC PANEL
Anion gap: 4 — ABNORMAL LOW (ref 5–15)
BUN: 45 mg/dL — ABNORMAL HIGH (ref 8–23)
CO2: 22 mmol/L (ref 22–32)
Calcium: 8.3 mg/dL — ABNORMAL LOW (ref 8.9–10.3)
Chloride: 119 mmol/L — ABNORMAL HIGH (ref 98–111)
Creatinine, Ser: 1.21 mg/dL — ABNORMAL HIGH (ref 0.44–1.00)
GFR, Estimated: 48 mL/min — ABNORMAL LOW (ref >60)
Glucose, Bld: 192 mg/dL — ABNORMAL HIGH (ref 70–99)
Potassium: 4.6 mmol/L (ref 3.5–5.1)
Sodium: 145 mmol/L (ref 135–145)

## 2022-06-01 LAB — BLOOD GAS, ARTERIAL
Acid-base deficit: 2.8 mmol/L — ABNORMAL HIGH (ref 0.0–2.0)
Bicarbonate: 23.2 mmol/L (ref 20.0–28.0)
Drawn by: 41977
FIO2: 40 %
O2 Saturation: 99.5 %
Patient temperature: 36.3
pCO2 arterial: 43 mmHg (ref 32–48)
pH, Arterial: 7.34 — ABNORMAL LOW (ref 7.35–7.45)
pO2, Arterial: 121 mmHg — ABNORMAL HIGH (ref 83–108)

## 2022-06-01 LAB — VANCOMYCIN, TROUGH: Vancomycin Tr: 15 ug/mL (ref 15–20)

## 2022-06-01 LAB — CBC
HCT: 30.6 % — ABNORMAL LOW (ref 36.0–46.0)
Hemoglobin: 9.2 g/dL — ABNORMAL LOW (ref 12.0–15.0)
MCH: 35 pg — ABNORMAL HIGH (ref 26.0–34.0)
MCHC: 30.1 g/dL (ref 30.0–36.0)
MCV: 116.3 fL — ABNORMAL HIGH (ref 80.0–100.0)
Platelets: 194 10*3/uL (ref 150–400)
RBC: 2.63 MIL/uL — ABNORMAL LOW (ref 3.87–5.11)
RDW: 16.3 % — ABNORMAL HIGH (ref 11.5–15.5)
WBC: 8.1 10*3/uL (ref 4.0–10.5)
nRBC: 1.5 % — ABNORMAL HIGH (ref 0.0–0.2)

## 2022-06-01 LAB — GLUCOSE, CAPILLARY
Glucose-Capillary: 154 mg/dL — ABNORMAL HIGH (ref 70–99)
Glucose-Capillary: 164 mg/dL — ABNORMAL HIGH (ref 70–99)
Glucose-Capillary: 167 mg/dL — ABNORMAL HIGH (ref 70–99)
Glucose-Capillary: 183 mg/dL — ABNORMAL HIGH (ref 70–99)
Glucose-Capillary: 188 mg/dL — ABNORMAL HIGH (ref 70–99)
Glucose-Capillary: 191 mg/dL — ABNORMAL HIGH (ref 70–99)

## 2022-06-01 LAB — PROCALCITONIN: Procalcitonin: 2.19 ng/mL

## 2022-06-01 LAB — PHOSPHORUS: Phosphorus: 2.2 mg/dL — ABNORMAL LOW (ref 2.5–4.6)

## 2022-06-01 LAB — MAGNESIUM: Magnesium: 1.9 mg/dL (ref 1.7–2.4)

## 2022-06-01 MED ORDER — K PHOS MONO-SOD PHOS DI & MONO 155-852-130 MG PO TABS
500.0000 mg | ORAL_TABLET | Freq: Two times a day (BID) | ORAL | Status: DC
  Administered 2022-06-01 – 2022-06-03 (×5): 500 mg
  Filled 2022-06-01 (×5): qty 2

## 2022-06-01 NOTE — Progress Notes (Addendum)
PROGRESS NOTE  [name] [id number] DOB: July 10, 1951 DOA: 05/28/2022 PCP: Carrolyn Meiers, MD  Brief History:  71 year old female with a history of coronary disease, hypertension, hyperlipidemia, functional constipation, B12 and folate deficiency, anxiety, and prior stroke presenting with shortness of breath and respiratory failure.  The patient was recently mid to the hospital from 05/02/2022 to 05/09/2022 when she had acute respiratory failure secondary to COVID-19.  During that hospitalization, the patient was also noted to have colitis for which she was treated with levofloxacin and metronidazole.  She completed a course of antibiotics prior to discharge.  She was discharged home with a prednisone taper. She states that she has had some shortness of breath since discharge from the hospital, but this has significantly worsened in the 24 hours prior to admission.  Upon arrival to the emergency department, the patient was noted to have respiratory distress patient was placed on BiPAP. Repeat ABG on BiPAP did not show significant improvement.  There was plans for intubation, but the patient was given Ativan with improvement clinically.  She remained on BiPAP overnight.  Notably, the patient states that she started having some burning chest discomfort on the morning of 05/28/2022.  She states that it somewhat improved while she was on BiPAP, but continues to have the chest discomfort.  She was taken off of BiPAP in the morning of 05/29/2022 and stated that she had some worsening of her burning chest discomfort.  She developed some increased shortness of breath resulting in placement back on BiPAP on the morning of 05/29/22.  Troponins were noted to be 375>> 478.  D-dimer was also noted to be elevated 3.97.  The patient was started on IV heparin. The patient was given Solu-Medrol 125 mg IV, morphine 1 mg IV and placed back on BiPAP.  Repeat EKG showed sinus tachycardia with T wave  inversion V2-V5.  Troponins continue to be cycled. cardiology was consulted to assist with management.   On the evening of 05/30/2022, the patient developed respiratory distress and hypoxia on BiPAP.  She remained agitated despite pharmacologic and nonpharmacologic interventions.  After discussion with the patient's family, decision was made to intubate the patient.  Repeat limited echocardiogram was ordered for 05/31/22.     Assessment and Plan: * Acute respiratory failure with hypoxia and hypercarbia (HCC) - Presented with respiratory distress with oxygen saturation 77% on room air -due to COPD exacerbation and pneumonia -Placed back on BiPAP 05/29/22 am -05/30/22 evening--agitation/resp distress>>intubated -CTA chest--neg PE, severe emphysema; multifocal patchy infiltrates RUL, RLL, LLL -personally reviewed CXR>>patchy interstitial L-lung opacities>R -wean to extubation  (fio2 down to 0.4)  COPD with acute exacerbation ([place]) Although the patient denies a history of COPD and has not had formal PFTs--> she endorses smoking over 30 years, 1/2 pack/day -Quit smoking 7 years ago -Continue Yupelri/Brovana -Continue IV Solu-Medrol -Continue Pulmicort  Elevated troponin Suspect demand ischemia -finished IV heparin x 48 hours total -Cardiology consult appreciated>>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 -Patient does complain of chest burning -Personally reviewed EKG--sinus rhythm, T wave depression V2-V5 -9/8 echo--EF 35-40%, apical HK, trivial MR/TR -9/10--repeat limited Echo-EF 35-40%, unchanged -9/10 personally reviewed EKG--sinus TWI V4-V6 essentially unchanged -9/11 started plavix (ASA allergy) when IV heparin finished  Lobar pneumonia (HCC) CTA chest as discussed above MRSA+ Continue vanc/aztreonam/azithro PCT 0.64 Obtain tracheal aspirate for culture>>non diagnostic  Hypophosphatemia replete  Hyperkalemia Given lokelma  Cardiomyopathy ([place]) -9/8 echo--EF 35-40%, apical HK, trivial  MR/TR Discussed with cardiology>>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  Essential hypertension Restart metoprolol--give as IV while patient is on vent          Family Communication: son updated 9/11  Consultants:  pulm, cardiology  Code Status:  FULL   DVT Prophylaxis:  [place] Heparin    Procedures: As Listed in Progress Note Above  Antibiotics: Vanc 9/8>> Aztreonam 9/8>> Azithro 9/8>>      Subjective: Intubated and sedated.  ROS no possible  Objective: Vitals:   06/01/22 1200 06/01/22 1300 06/01/22 1400 06/01/22 1500  BP: (!) 163/67 (!) 131/59 (!) 124/55 (!) 131/58  Pulse: (!) 108 (!) 107 (!) 106 (!) 109  Resp: (!) 22 11 (!) 21 15  Temp: 100 F (37.8 C) (!) 100.8 F (38.2 C) (!) 101.1 F (38.4 C) (!) 100.6 F (38.1 C)  TempSrc:      SpO2: 95% 94% 91% 99%  Weight:      Height:        Intake/Output Summary (Last 24 hours) at 06/01/2022 1600 Last data filed at 06/01/2022 1538 Gross per 24 hour  Intake 1776.52 ml  Output 1700 ml  Net 76.52 ml   Weight change: 0.1 kg Exam:  General:  Pt is intubated and sedated HEENT: No icterus, No thrush, No neck mass, [place]/AT Cardiovascular: RRR, S1/S2, no rubs, no gallops Respiratory: bilateral rales, L>R Abdomen: Soft/+BS, non tender, non distended, no guarding Extremities: No edema, No lymphangitis, No petechiae, No rashes, no  synovitis   Data Reviewed: I have personally reviewed following labs and imaging studies Basic Metabolic Panel: Recent Labs  Lab 05/29/22 0438 05/30/22 0525 05/31/22 0419 05/31/22 0645 06/01/22 0424  NA 141 142 145 145 145  K 4.3 5.6* 5.6* 5.2* 4.6  CL 114* 118* 117* 117* 119*  CO2 21* 19* 22 21* 22  GLUCOSE 91 193* 137* 142* 192*  BUN 13 19 29* 31* 45*  CREATININE 1.03* 1.22* 1.34* 1.40* 1.21*  CALCIUM 8.3* 8.7* 8.6* 8.4* 8.3*  MG  --   --  1.8  --  1.9  PHOS  --   --   --   --  2.2*   Liver Function Tests: Recent Labs  Lab 05/28/22 1640  AST 39  ALT 32  ALKPHOS 107  BILITOT 0.6  PROT 7.5  ALBUMIN 3.3*   No results for input(s): "LIPASE", "AMYLASE" in the last 168 hours. No results for input(s): "AMMONIA" in the last 168 hours. Coagulation Profile: No results for input(s): "INR", "PROTIME" in the last 168 hours. CBC: Recent Labs  Lab 05/28/22 1640 05/29/22 0438 05/30/22 0525 05/30/22 2002 05/31/22 0419 06/01/22 0424  WBC 6.3 5.6 5.7 10.3 8.3 8.1  NEUTROABS 3.7  --   --   --   --   --   HGB 8.6* 8.3* 9.2* 9.3* 9.6* 9.2*  HCT 28.5* 27.4* 31.3* 31.2* 32.9* 30.6*  MCV 115.4* 113.7* 117.7* 118.6* 117.9* 116.3*  PLT 341 303 349 278 251 194  Cardiac Enzymes: No results for input(s): "CKTOTAL", "CKMB", "CKMBINDEX", "TROPONINI" in the last 168 hours. BNP: Invalid input(s): "POCBNP" CBG: Recent Labs  Lab 05/31/22 2353 06/01/22 0449 06/01/22 0737 06/01/22 1134 06/01/22 1529  GLUCAP 169* 167* 164* 183* 188*   HbA1C: No results for input(s): "HGBA1C" in the last 72 hours. Urine analysis:    Component Value Date/Time   COLORURINE YELLOW 05/02/2022 0711   APPEARANCEUR HAZY (A) 05/02/2022 0711   LABSPEC 1.014 05/02/2022 0711   PHURINE 6.0 05/02/2022 0711   GLUCOSEU NEGATIVE 05/02/2022 0711   HGBUR SMALL (A) 05/02/2022 0711   BILIRUBINUR NEGATIVE 05/02/2022 0711   KETONESUR NEGATIVE 05/02/2022 0711   PROTEINUR >=300 (A) 05/02/2022 0711    UROBILINOGEN 1.0 07/16/2014 0244   NITRITE NEGATIVE 05/02/2022 0711   LEUKOCYTESUR NEGATIVE 05/02/2022 0711   Sepsis Labs: @LABRCNTIP (procalcitonin:4,lacticidven:4) ) Recent Results (from the past 240 hour(s))  Resp Panel by RT-PCR (Flu A&B, Covid) Anterior Nasal Swab     Status: None   Collection Time: 05/28/22  4:27 PM   Specimen: Anterior Nasal Swab  Result Value Ref Range Status   SARS Coronavirus 2 by RT PCR NEGATIVE NEGATIVE Final    Comment: (NOTE) SARS-CoV-2 target nucleic acids are NOT DETECTED.  The SARS-CoV-2 RNA is generally detectable in upper respiratory specimens during the acute phase of infection. The lowest concentration of SARS-CoV-2 viral copies this assay can detect is 138 copies/mL. A negative result does not preclude SARS-Cov-2 infection and should not be used as the sole basis for treatment or other patient management decisions. A negative result may occur with  improper specimen collection/handling, submission of specimen other than nasopharyngeal swab, presence of viral mutation(s) within the areas targeted by this assay, and inadequate number of viral copies(<138 copies/mL). A negative result must be combined with clinical observations, patient history, and epidemiological information. The expected result is Negative.  Fact Sheet for Patients:  EntrepreneurPulse.com.au  Fact Sheet for Healthcare Providers:  IncredibleEmployment.be  This test is no t yet approved or cleared by the [place] FDA and  has been authorized for detection and/or diagnosis of SARS-CoV-2 by FDA under an Emergency Use Authorization (EUA). This EUA will remain  in effect (meaning this test can be used) for the duration of the COVID-19 declaration under Section 564(b)(1) of the Act, 21 U.S.C.section 360bbb-3(b)(1), unless the authorization is terminated  or revoked sooner.       Influenza A by PCR NEGATIVE NEGATIVE Final   Influenza B  by PCR NEGATIVE NEGATIVE Final    Comment: (NOTE) The Xpert Xpress SARS-CoV-2/FLU/RSV plus assay is intended as an aid in the diagnosis of influenza from Nasopharyngeal swab specimens and should not be used as a sole basis for treatment. Nasal washings and aspirates are unacceptable for Xpert Xpress SARS-CoV-2/FLU/RSV testing.  Fact Sheet for Patients: EntrepreneurPulse.com.au  Fact Sheet for Healthcare Providers: IncredibleEmployment.be  This test is not yet approved or cleared by the [place] FDA and has been authorized for detection and/or diagnosis of SARS-CoV-2 by FDA under an Emergency Use Authorization (EUA). This EUA will remain in effect (meaning this test can be used) for the duration of the COVID-19 declaration under Section 564(b)(1) of the Act, 21 U.S.C. section 360bbb-3(b)(1), unless the authorization is terminated or revoked.  Performed at [facility], [address] [place], [place] [address]   MRSA Next Gen by PCR, Nasal     Status: Abnormal   Collection Time: 05/29/22  2:27 AM   Specimen: Nasal Mucosa; Nasal Swab  Result Value Ref Range Status   MRSA by PCR Next Gen DETECTED (A) NOT DETECTED Final    Comment: RESULT CALLED TO, READ BACK BY AND VERIFIED WITH: MILLS M @ 1106 ON [id number] BY HENDERSON L (NOTE) The GeneXpert MRSA Assay (FDA approved for NASAL specimens only), is one component of a comprehensive MRSA colonization surveillance program. It is not intended to diagnose MRSA infection nor to guide or monitor treatment for MRSA infections. Test performance is not FDA approved in patients less than 83 years old. Performed at [facility], [address] [place], [place] [address]   Culture, Respiratory w Gram Stain     Status: None (Preliminary result)   Collection Time: 05/31/22  8:00 AM   Specimen: Tracheal Aspirate; Respiratory  Result Value Ref Range Status   Specimen Description   Final    TRACHEAL  ASPIRATE Performed at [facility], [address] [place], [place] [address]    Special Requests   Final    NONE Performed at [facility], [address], [place], [place] [address]    Gram Stain NO WBC SEEN NO ORGANISMS SEEN   Final   Culture   Final    NO GROWTH 1 DAY Performed at [facility], [address], [place], [place] [address]    Report Status PENDING  Incomplete     Scheduled Meds:  arformoterol  15 mcg Nebulization BID   budesonide (PULMICORT) nebulizer solution  0.5 mg Nebulization BID   Chlorhexidine Gluconate Cloth  6 each Topical Q0600   clopidogrel  75 mg Per Tube Daily   feeding supplement (PROSource TF20)  60 mL Per Tube BID   free water  200 mL Per Tube Q8H   heparin  5,000 Units Subcutaneous Q8H   methylPREDNISolone (SOLU-MEDROL) injection  40 mg Intravenous Q12H   metoprolol tartrate  7.5 mg Intravenous Q6H   mupirocin ointment   Nasal BID   mouth rinse  15 mL Mouth Rinse Q2H   pantoprazole (PROTONIX) IV  40 mg Intravenous Q12H   phosphorus  500 mg Per Tube BID   polyethylene glycol  17 g Per Tube Daily   revefenacin  175 mcg Nebulization Daily   Continuous Infusions:  azithromycin Stopped (06/01/22 1329)   aztreonam Stopped (06/01/22 1420)   feeding supplement (VITAL AF 1.2 CAL) 30 mL/hr at 06/01/22 1538   fentaNYL infusion INTRAVENOUS 150 mcg/hr (06/01/22 1538)   vancomycin Stopped (06/01/22 1535)    Procedures/Studies: DG CHEST PORT 1 VIEW  Result Date: 06/01/2022 CLINICAL DATA:  Pneumonia EXAM: PORTABLE CHEST 1 VIEW COMPARISON:  Previous studies including the examination of 05/31/2022 FINDINGS: Tip of endotracheal tube is 7 cm above the carina. Enteric tube is noted traversing the esophagus. Cardiac size is within normal limits. There is slight improvement in infiltrates in left lung. There is possible slight worsening of infiltrate in right parahilar region. Bullous emphysema is seen in right upper lung field limiting evaluation for  small pneumothorax. There is no significant pleural effusion. No definite pneumothorax is seen. IMPRESSION: There is decrease in extensive interstitial infiltrates in left lung. There is slight worsening of infiltrate in right parahilar region. Severe bullous emphysema in the right upper lung field. Electronically Signed   By: Ernie Avena M.D.   On: 06/01/2022 09:17   ECHOCARDIOGRAM LIMITED  Result Date: 05/31/2022    ECHOCARDIOGRAM LIMITED REPORT   [name] Date of Exam: 05/31/2022 Medical Rec #:  [id number]  Height:       60.0 in Accession #:    [id number]        Weight:       90.2 lb Date of Birth:  09/28/1950         BSA:          1.330 m Patient Age:    31 years          BP:           125/55 mmHg Patient Gender: F                 HR:           101 bpm. Exam Location:  [facility] Procedure: Limited Echo Indications:    CHF-Acute Systolic [id number]  History:        Patient has prior history of Echocardiogram examinations, most                 recent 05/29/2022. Previous Myocardial Infarction and CAD, COPD                 and Stroke; Risk Factors:Hypertension and Dyslipidemia. Hx of                 CKD, COVID-10.  Sonographer:    Alvino Chapel RCS Referring Phys: [phone] Karthik Whittinghill  Sonographer Comments: *Patient on mechanical ventilator at present. IMPRESSIONS  1. Left ventricular ejection fraction, by estimation, is 35 to 40%. The left ventricle has moderately decreased function. The left ventricle demonstrates regional wall motion abnormalities (see scoring diagram/findings for description). The mid septal and all apical LV segments are akinetic (consistent with prior TTE).  2. Right ventricular systolic function is mildly reduced. The right ventricular size is normal.  3. The mitral valve is degenerative. Moderate mitral annular calcification. Comparison(s): Compared to prior TTE on 05/29/22, there is no significant change. FINDINGS  Left Ventricle: The mid septal and all  apical LV segments are akinetic. Left ventricular ejection fraction, by estimation, is 35 to 40%. The left ventricle has moderately decreased function. The left ventricle demonstrates regional wall motion abnormalities. The left ventricular internal cavity size was normal in size. There is no left ventricular hypertrophy. Right Ventricle: The right ventricular size is normal. Right ventricular systolic function is mildly reduced. Pericardium: There is no evidence of pericardial effusion. Mitral Valve: The mitral valve is degenerative in appearance. There is mild thickening of the mitral valve leaflet(s). There is mild calcification of the mitral valve leaflet(s). Moderate mitral annular calcification. Aorta: The aortic root is normal in size and structure. LEFT VENTRICLE PLAX 2D LVIDd:         3.90 cm LVIDs:         3.30 cm LV PW:         0.90 cm LV IVS:        0.90 cm LVOT diam:     1.70 cm LVOT Area:     2.27 cm  LV Volumes (MOD) LV vol d, MOD A2C: 38.7 ml LV vol d, MOD A4C: 50.3 ml LV vol s, MOD A2C: 21.4 ml LV vol s, MOD A4C: 26.8 ml LV SV MOD A2C:     17.3 ml LV SV MOD A4C:     50.3 ml LV SV MOD BP:      20.7 ml LEFT ATRIUM         Index LA diam:    3.20 cm 2.41 cm/m   AORTA Ao Root diam: 2.50 cm  SHUNTS  Systemic Diam: 1.70 cm Laurance Flatten MD Electronically signed by Laurance Flatten MD Signature Date/Time: 05/31/2022/1:31:58 PM    Final    DG Chest 1 View  Result Date: 05/31/2022 CLINICAL DATA:  Shortness of breath EXAM: CHEST  1 VIEW COMPARISON:  05/30/2022 FINDINGS: Endotracheal tube terminates 7 cm above the carina. Multifocal patchy left lung opacities, favoring pneumonia, possibly on the basis of aspiration. Right lung is essentially clear. No pleural effusion or pneumothorax. The heart is normal in size. Enteric tube courses into the stomach. Cholecystectomy clips. IMPRESSION: Endotracheal tube terminates 7 cm above the carina. Multifocal patchy left lung opacities, favoring pneumonia,  possibly on the basis of aspiration. Electronically Signed   By: Charline Bills M.D.   On: 05/31/2022 03:54   DG CHEST PORT 1 VIEW  Result Date: 05/30/2022 CLINICAL DATA:  Endotracheal and orogastric tube. EXAM: PORTABLE CHEST 1 VIEW COMPARISON:  Chest CT 05/29/2022.  Chest x-ray 05/30/2009 3. FINDINGS: Endotracheal tube tip is 5 cm above the carina. Enteric tube tip is in the mid stomach. There are increasing central interstitial and airspace opacities throughout the left lung. Severe emphysematous changes are again seen in the right upper lobe. There is no pleural effusion or pneumothorax. No acute fractures are seen. Cardiomediastinal silhouette is within normal limits. IMPRESSION: 1. Endotracheal tube tip 5 cm above carina. 2. Enteric tube tip at the level of the mid stomach. 3. Increasing left lung interstitial and airspace opacities which may represent edema or infection. 4. Stable severe emphysema. Electronically Signed   By: Darliss Cheney M.D.   On: 05/30/2022 19:50   DG CHEST PORT 1 VIEW  Result Date: 05/30/2022 CLINICAL DATA:  Respiratory failure, pneumonia EXAM: PORTABLE CHEST 1 VIEW COMPARISON:  Previous studies including chest radiograph done on 05/28/2022 and CT done on 05/29/2022 FINDINGS: There is diffuse increase in patchy interstitial and alveolar densities in both lungs, more so on the left side. There is relative sparing of right upper lung field due to underlying bullous emphysema. There is blunting of both lateral CP angles. There is no pneumothorax. IMPRESSION: There is diffuse increase in interstitial and alveolar markings in both lungs, more so on the left side. Findings suggest asymmetric pulmonary edema or worsening multifocal bilateral pneumonia. Small bilateral pleural effusions. Electronically Signed   By: Ernie Avena M.D.   On: 05/30/2022 10:05   ECHOCARDIOGRAM COMPLETE  Result Date: 05/29/2022    ECHOCARDIOGRAM REPORT   [name] Date of  Exam: 05/29/2022 Medical Rec #:  [id number]         Height:       60.0 in Accession #:    [id number]        Weight:       88.2 lb Date of Birth:  [birth year]         BSA:          1.318 m Patient Age:    70 years          BP:           143/66 mmHg Patient Gender: F                 HR:           113 bpm. [facility] Procedure: 2D Echo, Cardiac Doppler and Color Doppler Indications:    Elevated Troponin  History:        Patient has prior history of Echocardiogram examinations, most  recent 04/14/2018. CAD and Previous Myocardial Infarction, COPD                 and Stroke; Risk Factors:Hypertension and Dyslipidemia. Hx of                 CKD, COVID-10.  Sonographer:    Celesta Gentile RCS Referring Phys: [phone] Sura Canul  Sonographer Comments: ** Patient on BiPAP and moving Alot during echo. IMPRESSIONS  1. Left ventricular ejection fraction, by estimation, is 35 to 40%. The left ventricle has moderately decreased function. The left ventricle demonstrates regional wall motion abnormalities (see scoring diagram/findings for description). Left ventricular  diastolic parameters are indeterminate.  2. Right ventricular systolic function is mildly reduced. The right ventricular size is normal. There is moderately elevated pulmonary artery systolic pressure. The estimated right ventricular systolic pressure is 51.0 mmHg.  3. The mitral valve is degenerative. Trivial mitral valve regurgitation.  4. The aortic valve was not well visualized. Aortic valve regurgitation is not visualized. No aortic stenosis is present.  5. The inferior vena cava is dilated in size with <50% respiratory variability, suggesting right atrial pressure of 15 mmHg. Conclusion(s)/Recommendation(s): Normal systolic function at base with apical akinesis, differential includes LAD infarct vs stress induced cardiomyopathy. FINDINGS  Left Ventricle: Left ventricular ejection fraction, by estimation, is 35 to 40%. The left ventricle has  moderately decreased function. The left ventricle demonstrates regional wall motion abnormalities. The left ventricular internal cavity size was normal in size. There is no left ventricular hypertrophy. Left ventricular diastolic parameters are indeterminate.  LV Wall Scoring: The mid and distal anterior septum, entire apex, and mid inferoseptal segment are akinetic. The anterior wall, antero-lateral wall, inferior wall, posterior wall, basal anteroseptal segment, and basal inferoseptal segment are normal. Right Ventricle: The right ventricular size is normal. Right vetricular wall thickness was not well visualized. Right ventricular systolic function is mildly reduced. There is moderately elevated pulmonary artery systolic pressure. The tricuspid regurgitant velocity is 3.00 m/s, and with an assumed right atrial pressure of 15 mmHg, the estimated right ventricular systolic pressure is 51.0 mmHg. Left Atrium: Left atrial size was normal in size. Right Atrium: Right atrial size was normal in size. Pericardium: There is no evidence of pericardial effusion. Mitral Valve: The mitral valve is degenerative in appearance. Trivial mitral valve regurgitation. Tricuspid Valve: The tricuspid valve is normal in structure. Tricuspid valve regurgitation is trivial. Aortic Valve: The aortic valve was not well visualized. Aortic valve regurgitation is not visualized. No aortic stenosis is present. Pulmonic Valve: The pulmonic valve was not well visualized. Pulmonic valve regurgitation is not visualized. Aorta: The aortic root is normal in size and structure. Venous: The inferior vena cava is dilated in size with less than 50% respiratory variability, suggesting right atrial pressure of 15 mmHg. IAS/Shunts: The interatrial septum was not well visualized.  LEFT VENTRICLE PLAX 2D LVIDd:         3.60 cm LVIDs:         2.50 cm LV PW:         0.90 cm LV IVS:        0.90 cm LVOT diam:     1.70 cm LV SV:         37 LV SV Index:   28 LVOT  Area:     2.27 cm  LV Volumes (MOD) LV vol d, MOD A2C: 51.2 ml LV vol d, MOD A4C: 57.0 ml LV vol s, MOD A2C: 30.1 ml LV vol  s, MOD A4C: 32.9 ml LV SV MOD A2C:     21.1 ml LV SV MOD A4C:     57.0 ml LV SV MOD BP:      24.6 ml RIGHT VENTRICLE TAPSE (M-mode): 1.2 cm LEFT ATRIUM             Index        RIGHT ATRIUM           Index LA diam:        2.60 cm 1.97 cm/m   RA Area:     10.40 cm LA Vol (A2C):   32.4 ml 24.58 ml/m  RA Volume:   25.30 ml  19.20 ml/m LA Vol (A4C):   32.5 ml 24.66 ml/m LA Biplane Vol: 35.2 ml 26.71 ml/m  AORTIC VALVE LVOT Vmax:   88.30 cm/s LVOT Vmean:  55.300 cm/s LVOT VTI:    0.164 m  AORTA Ao Root diam: 2.50 cm MITRAL VALVE                TRICUSPID VALVE MV Area (PHT): 7.16 cm     TR Peak grad:   36.0 mmHg MV Decel Time: 106 msec     TR Vmax:        300.00 cm/s MV E velocity: 136.00 cm/s                             SHUNTS                             Systemic VTI:  0.16 m                             Systemic Diam: 1.70 cm Oswaldo Milian MD Electronically signed by Oswaldo Milian MD Signature Date/Time: 05/29/2022/2:52:39 PM    Final    CT Angio Chest Pulmonary Embolism (PE) W or WO Contrast  Result Date: 05/29/2022 CLINICAL DATA:  Shortness of breath x2 weeks EXAM: CT ANGIOGRAPHY CHEST WITH CONTRAST TECHNIQUE: Multidetector CT imaging of the chest was performed using the standard protocol during bolus administration of intravenous contrast. Multiplanar CT image reconstructions and MIPs were obtained to evaluate the vascular anatomy. RADIATION DOSE REDUCTION: This exam was performed according to the departmental dose-optimization program which includes automated exposure control, adjustment of the mA and/or kV according to patient size and/or use of iterative reconstruction technique. CONTRAST:  91mL OMNIPAQUE IOHEXOL 350 MG/ML SOLN COMPARISON:  Previous studies including the chest radiograph done on 05/28/2022 FINDINGS: Cardiovascular: There is homogeneous enhancement in  thoracic aorta. There are no intraluminal filling defects in central pulmonary artery branches. There are scattered coronary artery calcifications. Mediastinum/Nodes: No significant lymphadenopathy seen. There are small pockets of air in right subclavian vein, possibly introduced during venipuncture. Lungs/Pleura: Centrilobular and panlobular emphysema is seen. 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 There is small patchy infiltrate in the lateral aspect of posterior segment of right upper lobe. There is thickening of interlobular septi in the lower lung fields. There are patchy infiltrates in right middle lobe and both lower lobes. Small right pleural effusion is seen. There is minimal left pleural effusion. There is no pneumothorax. Upper Abdomen: There is 5.1 cm smooth marginated fluid density lesion in the upper pole of left kidney suggesting possible cyst. No follow-up is recommended. There are a few low-density lesions in liver measuring  up to 11 mm in size which are not fully characterized, most likely cysts. No follow-up imaging is recommended. Musculoskeletal: No acute findings are seen. Review of the MIP images confirms the above findings. IMPRESSION: There is no evidence of pulmonary artery embolism. There is no evidence of thoracic aortic dissection. Scattered coronary artery calcifications are seen. 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 There are small patchy infiltrates in posterior segment of right upper lobe, right middle lobe and both lower lobes suggesting multifocal atelectasis/pneumonia. Bilateral pleural effusions, more so on the right side. There are low-density lesions in left kidney and liver, possibly cysts. No follow-up imaging is recommended. Electronically Signed   By: Elmer Picker M.D.   On: 05/29/2022 13:04   DG Chest Portable 1 View  Result Date: 05/28/2022 CLINICAL DATA:  Shortness of breath for 2 weeks.  COVID positive. EXAM: PORTABLE CHEST  1 VIEW COMPARISON:  05/02/2022 FINDINGS: Lungs are hyperinflated. Significant bullous changes at the RIGHT lung apex, similar to prior. There are no focal consolidations or pleural effusions. No pulmonary edema. IMPRESSION: Hyperinflation of the lungs. No evidence for acute pulmonary abnormality. Electronically Signed   By: Nolon Nations M.D.   On: 05/28/2022 17:12    Orson Eva, DO  [facility]  If 7PM-7AM, please contact night-coverage [email or web address] Password [email or web address] 06/01/2022, 4:00 PM   LOS: 4 days

## 2022-06-01 NOTE — Progress Notes (Signed)
Progress Note  Patient Name: [name] Date of Encounter: 06/01/2022  Primary Cardiologist: Donato Heinz, MD  Subjective   Patient currently sedated on the ventilator.  Inpatient Medications    Scheduled Meds:  arformoterol  15 mcg Nebulization BID   budesonide (PULMICORT) nebulizer solution  0.5 mg Nebulization BID   Chlorhexidine Gluconate Cloth  6 each Topical Q0600   clopidogrel  75 mg Per Tube Daily   feeding supplement (PROSource TF20)  60 mL Per Tube BID   free water  200 mL Per Tube Q8H   heparin  5,000 Units Subcutaneous Q8H   methylPREDNISolone (SOLU-MEDROL) injection  40 mg Intravenous Q12H   metoprolol tartrate  7.5 mg Intravenous Q6H   mupirocin ointment   Nasal BID   mouth rinse  15 mL Mouth Rinse Q2H   pantoprazole (PROTONIX) IV  40 mg Intravenous Q12H   polyethylene glycol  17 g Per Tube Daily   revefenacin  175 mcg Nebulization Daily   Continuous Infusions:  azithromycin Stopped (05/31/22 1432)   aztreonam Stopped (06/01/22 0524)   feeding supplement (VITAL AF 1.2 CAL) 30 mL/hr at 06/01/22 0904   fentaNYL infusion INTRAVENOUS 125 mcg/hr (06/01/22 0904)   vancomycin Stopped (05/31/22 1426)   PRN Meds: acetaminophen **OR** acetaminophen, albuterol, fentaNYL, fentaNYL (SUBLIMAZE) injection, fentaNYL (SUBLIMAZE) injection, midazolam, ondansetron **OR** ondansetron (ZOFRAN) IV, mouth rinse, polyethylene glycol   Vital Signs    Vitals:   06/01/22 0900 06/01/22 0915 06/01/22 0930 06/01/22 0945  BP: (!) 143/62  (!) 135/52   Pulse: (!) 103 100 93 (!) 105  Resp: 12 16 (!) 22 (!) 23  Temp: 99.5 F (37.5 C)     TempSrc:      SpO2: (!) 88% 91% 92% 91%  Weight:      Height:        Intake/Output Summary (Last 24 hours) at 06/01/2022 1005 Last data filed at 06/01/2022 0904 Gross per 24 hour  Intake 2058.58 ml  Output 1000 ml  Net 1058.58 ml   Filed Weights   05/28/22 1619 05/31/22 0500 06/01/22 0455  Weight: 40 kg 40.9 kg 41 kg     Telemetry    Sinus tachycardia.  Personally reviewed.  ECG    An ECG dated 05/31/2022 was personally reviewed today and demonstrated:  Sinus tachycardia with low voltage, anterolateral T wave inversions, poor R wave progression rule out old anterior infarct pattern.  Physical Exam   GEN: No acute distress.   Neck: No JVD. Cardiac: RRR, no murmur or gallop.  Respiratory: Nonlabored.  Fine crackles on the left anteriorly with diminished breath sounds. GI: Soft, bowel sounds present. MS: No edema; kyphosis. Neuro:  Nonfocal. Psych: Patient sedated.  Labs    Chemistry Recent Labs  Lab 05/28/22 1640 05/29/22 0438 05/31/22 0419 05/31/22 0645 06/01/22 0424  NA 137   < > 145 145 145  K 4.3   < > 5.6* 5.2* 4.6  CL 109   < > 117* 117* 119*  CO2 22   < > 22 21* 22  GLUCOSE 120*   < > 137* 142* 192*  BUN 17   < > 29* 31* 45*  CREATININE 1.08*   < > 1.34* 1.40* 1.21*  CALCIUM 8.6*   < > 8.6* 8.4* 8.3*  PROT 7.5  --   --   --   --   ALBUMIN 3.3*  --   --   --   --   AST 39  --   --   --   --  ALT 32  --   --   --   --   ALKPHOS 107  --   --   --   --   BILITOT 0.6  --   --   --   --   GFRNONAA 55*   < > 42* 40* 48*  ANIONGAP 6   < > 6 7 4*   < > = values in this interval not displayed.     Hematology Recent Labs  Lab 05/30/22 2002 05/31/22 0419 06/01/22 0424  WBC 10.3 8.3 8.1  RBC 2.63* 2.79* 2.63*  HGB 9.3* 9.6* 9.2*  HCT 31.2* 32.9* 30.6*  MCV 118.6* 117.9* 116.3*  MCH 35.4* 34.4* 35.0*  MCHC 29.8* 29.2* 30.1  RDW 16.5* 16.4* 16.3*  PLT 278 251 194    Cardiac Enzymes Recent Labs  Lab 05/28/22 1640 05/28/22 2259 05/29/22 0438 05/29/22 0811 05/29/22 1012  TROPONINIHS 13 375* 478* 400* 368*    BNP Recent Labs  Lab 05/28/22 1640 05/30/22 2002  BNP 72.0 1,779.0*     DDimer Recent Labs  Lab 05/28/22 1725  DDIMER 3.97*     Radiology    DG CHEST PORT 1 VIEW  Result Date: 06/01/2022 CLINICAL DATA:  Pneumonia EXAM: PORTABLE CHEST 1 VIEW  COMPARISON:  Previous studies including the examination of 05/31/2022 FINDINGS: Tip of endotracheal tube is 7 cm above the carina. Enteric tube is noted traversing the esophagus. Cardiac size is within normal limits. There is slight improvement in infiltrates in left lung. There is possible slight worsening of infiltrate in right parahilar region. Bullous emphysema is seen in right upper lung field limiting evaluation for small pneumothorax. There is no significant pleural effusion. No definite pneumothorax is seen. IMPRESSION: There is decrease in extensive interstitial infiltrates in left lung. There is slight worsening of infiltrate in right parahilar region. Severe bullous emphysema in the right upper lung field. Electronically Signed   By: Ernie Avena M.D.   On: 06/01/2022 09:17   ECHOCARDIOGRAM LIMITED  Result Date: 05/31/2022    ECHOCARDIOGRAM LIMITED REPORT   [name] Date of Exam: 05/31/2022 Medical Rec #:  [id number]         Height:       60.0 in Accession #:    [id number]        Weight:       90.2 lb Date of Birth:  1951/04/02         BSA:          1.330 m Patient Age:    70 years          BP:           125/55 mmHg Patient Gender: F                 HR:           101 bpm. [facility] Procedure: Limited Echo Indications:    CHF-Acute Systolic I50.21  History:        Patient has prior history of Echocardiogram examinations, most                 recent 05/29/2022. Previous Myocardial Infarction and CAD, COPD                 and Stroke; Risk Factors:Hypertension and Dyslipidemia. Hx of                 CKD, COVID-10.  Sonographer:  Celesta Gentile RCS Referring Phys: [phone] DAVID TAT  Sonographer Comments: *Patient on mechanical ventilator at present. IMPRESSIONS  1. Left ventricular ejection fraction, by estimation, is 35 to 40%. The left ventricle has moderately decreased function. The left ventricle demonstrates regional wall motion abnormalities (see scoring  diagram/findings for description). The mid septal and all apical LV segments are akinetic (consistent with prior TTE).  2. Right ventricular systolic function is mildly reduced. The right ventricular size is normal.  3. The mitral valve is degenerative. Moderate mitral annular calcification. Comparison(s): Compared to prior TTE on 05/29/22, there is no significant change. FINDINGS  Left Ventricle: The mid septal and all apical LV segments are akinetic. Left ventricular ejection fraction, by estimation, is 35 to 40%. The left ventricle has moderately decreased function. The left ventricle demonstrates regional wall motion abnormalities. The left ventricular internal cavity size was normal in size. There is no left ventricular hypertrophy. Right Ventricle: The right ventricular size is normal. Right ventricular systolic function is mildly reduced. Pericardium: There is no evidence of pericardial effusion. Mitral Valve: The mitral valve is degenerative in appearance. There is mild thickening of the mitral valve leaflet(s). There is mild calcification of the mitral valve leaflet(s). Moderate mitral annular calcification. Aorta: The aortic root is normal in size and structure. LEFT VENTRICLE PLAX 2D LVIDd:         3.90 cm LVIDs:         3.30 cm LV PW:         0.90 cm LV IVS:        0.90 cm LVOT diam:     1.70 cm LVOT Area:     2.27 cm  LV Volumes (MOD) LV vol d, MOD A2C: 38.7 ml LV vol d, MOD A4C: 50.3 ml LV vol s, MOD A2C: 21.4 ml LV vol s, MOD A4C: 26.8 ml LV SV MOD A2C:     17.3 ml LV SV MOD A4C:     50.3 ml LV SV MOD BP:      20.7 ml LEFT ATRIUM         Index LA diam:    3.20 cm 2.41 cm/m   AORTA Ao Root diam: 2.50 cm  SHUNTS Systemic Diam: 1.70 cm Laurance Flatten MD Electronically signed by Laurance Flatten MD Signature Date/Time: 05/31/2022/1:31:58 PM    Final    DG Chest 1 View  Result Date: 05/31/2022 CLINICAL DATA:  Shortness of breath EXAM: CHEST  1 VIEW COMPARISON:  05/30/2022 FINDINGS: Endotracheal  tube terminates 7 cm above the carina. Multifocal patchy left lung opacities, favoring pneumonia, possibly on the basis of aspiration. Right lung is essentially clear. No pleural effusion or pneumothorax. The heart is normal in size. Enteric tube courses into the stomach. Cholecystectomy clips. IMPRESSION: Endotracheal tube terminates 7 cm above the carina. Multifocal patchy left lung opacities, favoring pneumonia, possibly on the basis of aspiration. Electronically Signed   By: Charline Bills M.D.   On: 05/31/2022 03:54   DG CHEST PORT 1 VIEW  Result Date: 05/30/2022 CLINICAL DATA:  Endotracheal and orogastric tube. EXAM: PORTABLE CHEST 1 VIEW COMPARISON:  Chest CT 05/29/2022.  Chest x-ray 05/30/2009 3. FINDINGS: Endotracheal tube tip is 5 cm above the carina. Enteric tube tip is in the mid stomach. There are increasing central interstitial and airspace opacities throughout the left lung. Severe emphysematous changes are again seen in the right upper lobe. There is no pleural effusion or pneumothorax. No acute fractures are seen. Cardiomediastinal silhouette is within normal  limits. IMPRESSION: 1. Endotracheal tube tip 5 cm above carina. 2. Enteric tube tip at the level of the mid stomach. 3. Increasing left lung interstitial and airspace opacities which may represent edema or infection. 4. Stable severe emphysema. Electronically Signed   By: Ronney Asters M.D.   On: 05/30/2022 19:50     Assessment & Plan    1.  HFrEF, newly documented with LVEF 35 to 40% and wall motion abnormalities suggesting possibility of stress-induced cardiomyopathy or LAD distribution infarct.  Complicating picture is recent COVID-19 with current hypoxic respiratory failure due to pneumonia and underlying severe COPD.  She remains on the ventilator.  Repeat limited echocardiogram over the weekend shows no change in LVEF, which would not necessarily be expected in the short-term anyway.  2.  History of CAD with reported inferior  infarct and PTCA with stent to the RCA in 1997, NSTEMI in 2004 with documentation of occluded RCA that was managed medically.  Current high-sensitivity troponin I levels 300-400 range, not necessarily consistent with ACS or in line with degree of LV dysfunction in terms of an acute event.  She completed 48-hour course of IV heparin.  Currently on Plavix and IV Lopressor.  3.  Fluctuating anemia, hemoglobin recently stable in the 8-9 range.  No obvious active bleeding.  4.  Acute hypoxic and hypercarbic respiratory failure with lobar pneumonia, MRSA positive, recent COVID-19 infection as well, and underlying severe COPD.  Remains on the ventilator and on broad-spectrum antibiotics.  5.  Possible CKD stage IIIa, creatinine 1.21.  Chart reviewed, I spoke with the patient's youngest son in the room this morning.  At this point would continue with supportive measures as per primary team and pulmonary.  Depending on how she does clinically, we may need to consider a diagnostic cardiac catheterization ultimately to assess coronary anatomy.  Continue Plavix and IV Lopressor for now.  We will continue to follow with you.  Signed, Rozann Lesches, MD  06/01/2022, 10:05 AM

## 2022-06-01 NOTE — Progress Notes (Signed)
NAME:  [name], MRN:  [id number], DOB:  1951-04-23, LOS: 4 ADMISSION DATE:  05/28/2022, CONSULTATION DATE:  05/29/2022 REFERRING MD:  Dr. Carles Collet, [facility], CHIEF COMPLAINT:  Dyspnea   History of Present Illness:  71 yo female former smoker presented to [facility] ER with dyspnea.  SpO2 77% on room air.  Admitted  8/12 to 8/19 with COVID 19 infection with AKI and colitis, but didn't need supplemental oxygen at discharge.  Her breathing has gotten progressive worse since hospital discharge.  Her chest xray showed hyperinflation with bullous changes.  She was started on supplemental oxygen and Bipap, and started on therapy for possible COPD exacerbation.  PCCM asked to assist with respiratory management in ICU.  Pertinent  Medical History  Anxiety, Blindness Rt eye, CVA, GERD, HLD, HTN, Depression, CAD s/p stent, CKD 3a  Significant Hospital Events: Including procedures, antibiotic start and stop dates in addition to other pertinent events   9/07 Admit, start on Bipap 9/08 Cardiology consulted for elevated troponin, start heparin gtt 9/8 rx zmax/vanc/aztreonam>>>  ET 9/9 >>>  Studies:  Echo 9/08 >-EF 35-40%, apical HK, trivial MR/TR c/w Takatsubo's  CT angio chest 9/08 >> centrilobular/paraseptal emphysema with bullous emphysema RUL, patchy infiltrate RUL/RML/RLL/LLL, small Rt effusion MRSA PCR screen 9/8  POS   Interim History / Subjective:  Not tolerating decrease in fent IV > asynchronous with vent   Objective   BP (!) 130/51   Pulse 86   Temp 100.2 F (37.9 C)   Resp 16   Ht 5' (1.524 m)   Wt 41 kg   SpO2 99%   BMI 17.65 kg/m   Vent Mode: PRVC FiO2 (%):  [30 %-40 %] 30 % Set Rate:  [20 bmp-22 bmp] 20 bmp Vt Set:  [37 mL-370 mL] 370 mL PEEP:  [5 cmH20] 5 cmH20 Plateau Pressure:  [16 cmH20-23 cmH20] 16 cmH20   Intake/Output Summary (Last 24 hours) at 06/01/2022 1610 Last data filed at 06/01/2022 1538 Gross per 24 hour  Intake 1739.71 ml  Output 1700 ml  Net 39.71 ml    Filed Weights   05/28/22 1619 05/31/22 0500 06/01/22 0455  Weight: 40 kg 40.9 kg 41 kg    Examination:  Tmax:  100.4  General appearance:    elderly bf on fent drip/ asynchronous with vent    No jvd Oropharynx et Neck supple Lungs with a few very distant scattered exp > insp rhonchi bilaterally RRR no s3 or or sign murmur Abd soft/ nl excursion  Extr warm with no edema or clubbing noted Neuro  Sensorium sedated, no resp to verbal,  no apparent motor deficits    I personally reviewed images and agree with radiology impression as follows:  CXR:   portale 9/11 decrease in extensive interstitial infiltrates in left lung. There is slight worsening of infiltrate in right parahilar region. Severe bullous emphysema in the right upper lung field.  Resolved Hospital Problem list     Assessment & Plan:   Acute hypoxic/hypercapnic respiratory failure from COPD exacerbation and pneumonia after recent COVID 19 infection in setting of bullous emphysema. >>> difficulty with vent synchrony/ not really weanable on this sedation protocol so add precedex am 9/12 and wean down/ off fentanyl if tol          Elevated troponin from demand ischemia. Hx of CAD, HTN. -9/8 echo--EF 35-40%, apical HK, trivial MR/TR c/w Takatsubo's   CKD 3a with AKI, improved  Lab Results  Component Value Date   CREATININE  1.21 (H) 06/01/2022   CREATININE 1.40 (H) 05/31/2022   CREATININE 1.34 (H) 05/31/2022    >>> keep euvolemic,  monitor urine outpt  Macrocytic anemia. - f/u CBC  Pulmonary cachexia. - will ask dietician to assess nutritional needs  Best Practice (right click and "Reselect all SmartList Selections" daily)    Per [facility]   Labs   CBC: Recent Labs  Lab 05/28/22 1640 05/29/22 0438 05/30/22 0525 05/30/22 2002 05/31/22 0419 06/01/22 0424  WBC 6.3 5.6 5.7 10.3 8.3 8.1  NEUTROABS 3.7  --   --   --   --   --   HGB 8.6* 8.3* 9.2* 9.3* 9.6* 9.2*  HCT 28.5* 27.4* 31.3* 31.2* 32.9*  30.6*  MCV 115.4* 113.7* 117.7* 118.6* 117.9* 116.3*  PLT 341 303 349 278 251 194    Basic Metabolic Panel: Recent Labs  Lab 05/29/22 0438 05/30/22 0525 05/31/22 0419 05/31/22 0645 06/01/22 0424  NA 141 142 145 145 145  K 4.3 5.6* 5.6* 5.2* 4.6  CL 114* 118* 117* 117* 119*  CO2 21* 19* 22 21* 22  GLUCOSE 91 193* 137* 142* 192*  BUN 13 19 29* 31* 45*  CREATININE 1.03* 1.22* 1.34* 1.40* 1.21*  CALCIUM 8.3* 8.7* 8.6* 8.4* 8.3*  MG  --   --  1.8  --  1.9  PHOS  --   --   --   --  2.2*   GFR: Estimated Creatinine Clearance: 27.2 mL/min (A) (by C-G formula based on SCr of 1.21 mg/dL (H)). Recent Labs  Lab 05/29/22 0438 05/30/22 0525 05/30/22 2002 05/31/22 0419 06/01/22 0424  PROCALCITON 0.64  --   --  2.06 2.19  WBC 5.6 5.7 10.3 8.3 8.1    Liver Function Tests: Recent Labs  Lab 05/28/22 1640  AST 39  ALT 32  ALKPHOS 107  BILITOT 0.6  PROT 7.5  ALBUMIN 3.3*   No results for input(s): "LIPASE", "AMYLASE" in the last 168 hours. No results for input(s): "AMMONIA" in the last 168 hours.  ABG    Component Value Date/Time   PHART 7.34 (L) 06/01/2022 0753   PCO2ART 43 06/01/2022 0753   PO2ART 121 (H) 06/01/2022 0753   HCO3 23.2 06/01/2022 0753   ACIDBASEDEF 2.8 (H) 06/01/2022 0753   O2SAT 99.5 06/01/2022 0753     Coagulation Profile: No results for input(s): "INR", "PROTIME" in the last 168 hours.  Cardiac Enzymes: No results for input(s): "CKTOTAL", "CKMB", "CKMBINDEX", "TROPONINI" in the last 168 hours.  HbA1C: Hgb A1c MFr Bld  Date/Time Value Ref Range Status  05/07/2022 04:58 PM 5.9 (H) 4.8 - 5.6 % Final    Comment:    (NOTE) Pre diabetes:          5.7%-6.4%  Diabetes:              >6.4%  Glycemic control for   <7.0% adults with diabetes   09/07/2011 04:40 AM 6.0 (H) <5.7 % Final    Comment:    (NOTE)                                                                       According to the ADA Clinical Practice Recommendations for 2011,  when HbA1c is used as a  screening test:  >=6.5%   Diagnostic of Diabetes Mellitus           (if abnormal result is confirmed) 5.7-6.4%   Increased risk of developing Diabetes Mellitus References:Diagnosis and Classification of Diabetes Mellitus,Diabetes Care,2011,34(Suppl 1):S62-S69 and Standards of Medical Care in         Diabetes - 2011,Diabetes Care,2011,34 (Suppl 1):S11-S61.    CBG: Recent Labs  Lab 05/31/22 2353 06/01/22 0449 06/01/22 0737 06/01/22 1134 06/01/22 1529  GLUCAP 169* 167* 164* 183* 188*      The patient is critically ill with multiple organ systems failure and requires high complexity decision making for assessment and support, frequent evaluation and titration of therapies, application of advanced monitoring technologies and extensive interpretation of multiple databases. Critical Care Time devoted to patient care services described in this note is 40  minutes.   April Hughs, MD Pulmonary and Critical Care Medicine [facility] Cell [phone]   After 7:00 pm call Elink  [phone]

## 2022-06-01 NOTE — Assessment & Plan Note (Addendum)
-   Continue to follow electrolytes and replete as needed.

## 2022-06-01 NOTE — Progress Notes (Signed)
Pharmacy Antibiotic Note  [name] a 71 y.o. female admitted on 06/01/2022 with pneumonia.  Pharmacy has been consulted for vancomycin dosing.  Plan: VT 15 Continue vancomycin 750 mg IV every 24 hours. Monitor labs, c/s, and vanco level as indicated.   Medical History: Past Medical History:  Diagnosis Date   Anxiety    Blind    right   Colitis 09/07/2011   CVA (cerebral infarction)    GERD (gastroesophageal reflux disease)    Hyperlipidemia    Hypertension    Myocardial infarct (HCC)    1997, 2004   Severe major depression with psychotic features (HCC)    2004   Stroke [facility]) 2002    Allergies:  Allergies  Allergen Reactions   Penicillins Anaphylaxis   Aspirin Swelling and Other (See Comments)    Only in large doses will cause a reaction   Bee Venom Swelling    Severe swelling   Contrast Media [Iodinated Contrast Media] Swelling   Dilaudid [Hydromorphone Hcl] Itching   Lisinopril Swelling   Motrin [Ibuprofen] Swelling    Filed Weights   05/28/22 1619 05/31/22 0500 06/01/22 0455  Weight: 40 kg (88 lb 2.9 oz) 40.9 kg (90 lb 2.7 oz) 41 kg (90 lb 6.2 oz)       Latest Ref Rng & Units 06/01/2022    4:24 AM 05/31/2022    4:19 AM 05/30/2022    8:02 PM  CBC  WBC 4.0 - 10.5 K/uL 8.1  8.3  10.3   Hemoglobin 12.0 - 15.0 g/dL 9.2  9.6  9.3   Hematocrit 36.0 - 46.0 % 30.6  32.9  31.2   Platelets 150 - 400 K/uL 194  251  278      Estimated Creatinine Clearance: 27.2 mL/min (A) (by C-G formula based on SCr of 1.21 mg/dL (H)).  Antibiotics Given (last 72 hours)     Date/Time Action Medication Dose Rate   05/29/22 1458 New Bag/Given   azithromycin (ZITHROMAX) 500 mg in sodium chloride 0.9 % 250 mL IVPB 500 mg 250 mL/hr   05/29/22 1615 New Bag/Given   aztreonam (AZACTAM) 1 g in sodium chloride 0.9 % 100 mL IVPB 1 g 200 mL/hr   05/29/22 2110 New Bag/Given   aztreonam (AZACTAM) 1 g in sodium chloride 0.9 % 100 mL IVPB 1 g 200 mL/hr   05/30/22 0534 New Bag/Given    aztreonam (AZACTAM) 1 g in sodium chloride 0.9 % 100 mL IVPB 1 g 200 mL/hr   05/30/22 1342 New Bag/Given   vancomycin (VANCOREADY) IVPB 750 mg/150 mL 750 mg 150 mL/hr   05/30/22 1507 New Bag/Given   azithromycin (ZITHROMAX) 500 mg in sodium chloride 0.9 % 250 mL IVPB 500 mg 250 mL/hr   05/30/22 1648 New Bag/Given   aztreonam (AZACTAM) 1 g in sodium chloride 0.9 % 100 mL IVPB 1 g 200 mL/hr   05/30/22 2218 New Bag/Given   aztreonam (AZACTAM) 1 g in sodium chloride 0.9 % 100 mL IVPB 1 g 200 mL/hr   05/31/22 0550 New Bag/Given   aztreonam (AZACTAM) 1 g in sodium chloride 0.9 % 100 mL IVPB 1 g 200 mL/hr   05/31/22 1317 New Bag/Given   vancomycin (VANCOREADY) IVPB 750 mg/150 mL 750 mg 150 mL/hr   05/31/22 1321 New Bag/Given   azithromycin (ZITHROMAX) 500 mg in sodium chloride 0.9 % 250 mL IVPB 500 mg 250 mL/hr   05/31/22 1429 New Bag/Given   aztreonam (AZACTAM) 1 g in sodium chloride 0.9 %  100 mL IVPB 1 g 200 mL/hr   05/31/22 2153 New Bag/Given   aztreonam (AZACTAM) 1 g in sodium chloride 0.9 % 100 mL IVPB 1 g 200 mL/hr   06/01/22 0454 New Bag/Given   aztreonam (AZACTAM) 1 g in sodium chloride 0.9 % 100 mL IVPB 1 g 200 mL/hr   06/01/22 1228 New Bag/Given   azithromycin (ZITHROMAX) 500 mg in sodium chloride 0.9 % 250 mL IVPB 500 mg 250 mL/hr   06/01/22 1350 New Bag/Given   aztreonam (AZACTAM) 1 g in sodium chloride 0.9 % 100 mL IVPB 1 g 200 mL/hr       Antimicrobials this admission:  vancomycin 05/29/2022  >>  Aztreonam 9/7 >> Azithromycin >>  Microbiology results: 9/10 Respiratory aspirate: pending MRSA PCR: positive  Thank you for allowing pharmacy to be a part of this patient's care.  Tad Moore, PharmD Clinical Pharmacist

## 2022-06-01 NOTE — TOC Initial Note (Signed)
Transition of Care [facility]) - Initial/Assessment Note    Patient Details  Name: [name] MRN: [id number] Date of Birth: [birth year]  Transition of Care [facility]) CM/SW Contact:    Annice Needy, LCSW Phone Number: 06/01/2022, 1:23 PM  Clinical Narrative:                 Patient admitted for Acute respiratory failure with hypoxia and hypercarbia. Considered high risk for readmission. On vent. TOC following and will assess when medically stable.     Barriers to Discharge: Continued Medical Work up   Patient Goals and CMS Choice        Expected Discharge Plan and Services                                                Prior Living Arrangements/Services                       Activities of Daily Living Home Assistive Devices/Equipment: None ADL Screening (condition at time of admission) Patient's cognitive ability adequate to safely complete daily activities?: Yes Is the patient deaf or have difficulty hearing?: No Does the patient have difficulty seeing, even when wearing glasses/contacts?: Yes Does the patient have difficulty concentrating, remembering, or making decisions?: No Patient able to express need for assistance with ADLs?: Yes Does the patient have difficulty dressing or bathing?: No Independently performs ADLs?: Yes (appropriate for developmental age) Does the patient have difficulty walking or climbing stairs?: Yes Weakness of Legs: Both Weakness of Arms/Hands: None  Permission Sought/Granted                  Emotional Assessment              Admission diagnosis:  Hypoxia [R09.02] Acute respiratory failure with hypoxia (HCC) [J96.01] Anemia, unspecified type [D64.9] Patient Active Problem List   Diagnosis Date Noted   Hyperkalemia 05/31/2022   Malnutrition of moderate degree 05/30/2022   Cardiomyopathy (HCC) 05/30/2022   Lobar pneumonia (HCC) 05/30/2022   Elevated troponin 05/29/2022   Chronic kidney disease,  stage 3a (HCC) 05/29/2022   COPD with acute exacerbation (HCC) 05/29/2022   Anemia    History of COVID-19    Gastroesophageal reflux disease    Nausea vomiting and diarrhea    Dysphagia    Pressure injury of skin 05/03/2022   COVID-19 virus infection 05/02/2022   AKI (acute kidney injury) (HCC) 05/02/2022   Acute respiratory failure with hypoxia and hypercarbia (HCC) 05/02/2022   Hypokalemia    Calculus of gallbladder with acute cholecystitis and obstruction    Choledocholithiasis 10/09/2021   Noninfectious gastroenteritis 07/16/2014   Constipation - functional 10/27/2012   Colon cancer screening 10/27/2012   History of stroke 10/26/2012   Chronic back pain 10/26/2012   HYPERLIPIDEMIA-MIXED 03/18/2009   Essential hypertension 03/18/2009   RENAL DISEASE 03/18/2009   PCP:  Benetta Spar, MD Pharmacy:   [facility] [phone] - [place], [place] - [address] AT [facility] OF [address] [place] [address] Phone: [phone] Fax: [phone]     Social Determinants of Health (SDOH) Interventions    Readmission Risk Interventions     No data to display

## 2022-06-02 DIAGNOSIS — R7302 Impaired glucose tolerance (oral): Secondary | ICD-10-CM

## 2022-06-02 DIAGNOSIS — R778 Other specified abnormalities of plasma proteins: Secondary | ICD-10-CM | POA: Diagnosis not present

## 2022-06-02 DIAGNOSIS — J441 Chronic obstructive pulmonary disease with (acute) exacerbation: Secondary | ICD-10-CM | POA: Diagnosis not present

## 2022-06-02 DIAGNOSIS — J181 Lobar pneumonia, unspecified organism: Secondary | ICD-10-CM | POA: Diagnosis not present

## 2022-06-02 DIAGNOSIS — I428 Other cardiomyopathies: Secondary | ICD-10-CM | POA: Diagnosis not present

## 2022-06-02 DIAGNOSIS — E870 Hyperosmolality and hypernatremia: Secondary | ICD-10-CM

## 2022-06-02 DIAGNOSIS — J9601 Acute respiratory failure with hypoxia: Secondary | ICD-10-CM | POA: Diagnosis not present

## 2022-06-02 DIAGNOSIS — I5181 Takotsubo syndrome: Secondary | ICD-10-CM

## 2022-06-02 DIAGNOSIS — J9602 Acute respiratory failure with hypercapnia: Secondary | ICD-10-CM | POA: Diagnosis not present

## 2022-06-02 DIAGNOSIS — N1831 Chronic kidney disease, stage 3a: Secondary | ICD-10-CM | POA: Diagnosis not present

## 2022-06-02 LAB — URINALYSIS, COMPLETE (UACMP) WITH MICROSCOPIC
Bilirubin Urine: NEGATIVE
Glucose, UA: NEGATIVE mg/dL
Hgb urine dipstick: NEGATIVE
Ketones, ur: NEGATIVE mg/dL
Leukocytes,Ua: NEGATIVE
Nitrite: NEGATIVE
Protein, ur: NEGATIVE mg/dL
Specific Gravity, Urine: 1.016 (ref 1.005–1.030)
pH: 5 (ref 5.0–8.0)

## 2022-06-02 LAB — CULTURE, RESPIRATORY W GRAM STAIN
Culture: NO GROWTH
Gram Stain: NONE SEEN

## 2022-06-02 LAB — GLUCOSE, CAPILLARY
Glucose-Capillary: 178 mg/dL — ABNORMAL HIGH (ref 70–99)
Glucose-Capillary: 201 mg/dL — ABNORMAL HIGH (ref 70–99)
Glucose-Capillary: 214 mg/dL — ABNORMAL HIGH (ref 70–99)
Glucose-Capillary: 154 mg/dL — ABNORMAL HIGH (ref 70–99)

## 2022-06-02 LAB — CBC
HCT: 32.1 % — ABNORMAL LOW (ref 36.0–46.0)
Hemoglobin: 9.5 g/dL — ABNORMAL LOW (ref 12.0–15.0)
MCH: 34.9 pg — ABNORMAL HIGH (ref 26.0–34.0)
MCHC: 29.6 g/dL — ABNORMAL LOW (ref 30.0–36.0)
MCV: 118 fL — ABNORMAL HIGH (ref 80.0–100.0)
Platelets: 198 10*3/uL (ref 150–400)
RBC: 2.72 MIL/uL — ABNORMAL LOW (ref 3.87–5.11)
RDW: 16.3 % — ABNORMAL HIGH (ref 11.5–15.5)
WBC: 9.3 10*3/uL (ref 4.0–10.5)
nRBC: 0.9 % — ABNORMAL HIGH (ref 0.0–0.2)

## 2022-06-02 LAB — BASIC METABOLIC PANEL
Anion gap: 3 — ABNORMAL LOW (ref 5–15)
BUN: 47 mg/dL — ABNORMAL HIGH (ref 8–23)
CO2: 24 mmol/L (ref 22–32)
Calcium: 8.7 mg/dL — ABNORMAL LOW (ref 8.9–10.3)
Chloride: 121 mmol/L — ABNORMAL HIGH (ref 98–111)
Creatinine, Ser: 1.02 mg/dL — ABNORMAL HIGH (ref 0.44–1.00)
GFR, Estimated: 58 mL/min — ABNORMAL LOW (ref >60)
Glucose, Bld: 199 mg/dL — ABNORMAL HIGH (ref 70–99)
Potassium: 4.5 mmol/L (ref 3.5–5.1)
Sodium: 148 mmol/L — ABNORMAL HIGH (ref 135–145)

## 2022-06-02 LAB — MAGNESIUM: Magnesium: 2 mg/dL (ref 1.7–2.4)

## 2022-06-02 MED ORDER — INSULIN ASPART 100 UNIT/ML IJ SOLN: 0.0000 [IU] | Freq: Every day | INTRAMUSCULAR | Status: DC

## 2022-06-02 MED ORDER — DEXMEDETOMIDINE HCL IN NACL 400 MCG/100ML IV SOLN
0.4000 ug/kg/h | INTRAVENOUS | Status: DC
  Administered 2022-06-02: 1 ug/kg/h via INTRAVENOUS
  Administered 2022-06-02: 0.4 ug/kg/h via INTRAVENOUS
  Administered 2022-06-03 (×3): 1.2 ug/kg/h via INTRAVENOUS
  Administered 2022-06-04: 0.5 ug/kg/h via INTRAVENOUS
  Administered 2022-06-04: 0.4 ug/kg/h via INTRAVENOUS
  Administered 2022-06-04: 1.2 ug/kg/h via INTRAVENOUS
  Administered 2022-06-05: 0.9 ug/kg/h via INTRAVENOUS
  Administered 2022-06-05: 0.8 ug/kg/h via INTRAVENOUS
  Administered 2022-06-05 – 2022-06-06 (×2): 1 ug/kg/h via INTRAVENOUS
  Filled 2022-06-02 (×9): qty 100
  Filled 2022-06-02: qty 200

## 2022-06-02 MED ORDER — HYDRALAZINE HCL 20 MG/ML IJ SOLN
10.0000 mg | Freq: Four times a day (QID) | INTRAMUSCULAR | Status: DC | PRN
  Administered 2022-06-02: 10 mg via INTRAVENOUS
  Filled 2022-06-02 (×2): qty 1

## 2022-06-02 MED ORDER — INSULIN ASPART 100 UNIT/ML IJ SOLN
0.0000 [IU] | INTRAMUSCULAR | Status: DC
  Administered 2022-06-02: 2 [IU] via SUBCUTANEOUS
  Administered 2022-06-02: 3 [IU] via SUBCUTANEOUS
  Administered 2022-06-03: 5 [IU] via SUBCUTANEOUS
  Administered 2022-06-03 (×3): 2 [IU] via SUBCUTANEOUS
  Administered 2022-06-03 – 2022-06-04 (×4): 3 [IU] via SUBCUTANEOUS
  Administered 2022-06-04 – 2022-06-05 (×3): 2 [IU] via SUBCUTANEOUS
  Administered 2022-06-05 (×3): 1 [IU] via SUBCUTANEOUS
  Administered 2022-06-05: 2 [IU] via SUBCUTANEOUS
  Administered 2022-06-05 – 2022-06-06 (×2): 1 [IU] via SUBCUTANEOUS
  Administered 2022-06-06: 2 [IU] via SUBCUTANEOUS
  Administered 2022-06-06 – 2022-06-08 (×8): 1 [IU] via SUBCUTANEOUS
  Administered 2022-06-09: 2 [IU] via SUBCUTANEOUS
  Administered 2022-06-09: 3 [IU] via SUBCUTANEOUS
  Administered 2022-06-10 – 2022-06-14 (×8): 1 [IU] via SUBCUTANEOUS
  Administered 2022-06-14: 2 [IU] via SUBCUTANEOUS
  Administered 2022-06-15: 1 [IU] via SUBCUTANEOUS
  Administered 2022-06-16: 2 [IU] via SUBCUTANEOUS
  Administered 2022-06-16: 1 [IU] via SUBCUTANEOUS
  Administered 2022-06-16: 3 [IU] via SUBCUTANEOUS

## 2022-06-02 MED ORDER — FREE WATER
200.0000 mL | Status: DC
  Administered 2022-06-02 – 2022-06-04 (×10): 200 mL

## 2022-06-02 NOTE — Progress Notes (Signed)
Unable to complete wake up assessment this morning. While giving meds she became very agitated and  heart rate increased to 160 bpm SVT.100 mcg of fentanyl was bolused from the bag and patient is now calm with heart rate down to 97 bpm. Blood pressure remains stable as well as O2 sats.

## 2022-06-02 NOTE — Inpatient Diabetes Management (Signed)
Inpatient Diabetes Program Recommendations  AACE/ADA: New Consensus Statement on Inpatient Glycemic Control   Target Ranges:  Prepandial:   less than 140 mg/dL      Peak postprandial:   less than 180 mg/dL (1-2 hours)      Critically ill patients:  140 - 180 mg/dL    Latest Reference Range & Units 06/01/22 07:37 06/01/22 11:34 06/01/22 15:29 06/01/22 19:25 06/01/22 23:41 06/02/22 03:49  Glucose-Capillary 70 - 99 mg/dL 694 (H) 854 (H) 627 (H) 154 (H) 191 (H) 178 (H)   Review of Glycemic Control  Diabetes history: No Outpatient Diabetes medications: NA Current orders for Inpatient glycemic control: None; Solumedrol 40 mg Q12H  Inpatient Diabetes Program Recommendations:    Insulin: If steroids are continued, may want to consider ordering Novolog 0-9 units Q4H.  Thanks, Orlando Penner, RN, MSN, CDCES Diabetes Coordinator Inpatient Diabetes Program [phone] (Team Pager from 8am to 5pm)

## 2022-06-02 NOTE — Progress Notes (Signed)
Progress Note  Patient Name: [name] Date of Encounter: 06/02/2022  Primary Cardiologist: Donato Heinz, MD  Subjective   Sedated on ventilator.  Did have some agitation earlier this morning with transient episode of SVT by telemetry which I personally reviewed.  Inpatient Medications    Scheduled Meds:  arformoterol  15 mcg Nebulization BID   budesonide (PULMICORT) nebulizer solution  0.5 mg Nebulization BID   Chlorhexidine Gluconate Cloth  6 each Topical Q0600   clopidogrel  75 mg Per Tube Daily   feeding supplement (PROSource TF20)  60 mL Per Tube BID   free water  200 mL Per Tube Q8H   heparin  5,000 Units Subcutaneous Q8H   methylPREDNISolone (SOLU-MEDROL) injection  40 mg Intravenous Q12H   metoprolol tartrate  7.5 mg Intravenous Q6H   mupirocin ointment   Nasal BID   mouth rinse  15 mL Mouth Rinse Q2H   pantoprazole (PROTONIX) IV  40 mg Intravenous Q12H   phosphorus  500 mg Per Tube BID   polyethylene glycol  17 g Per Tube Daily   revefenacin  175 mcg Nebulization Daily   Continuous Infusions:  azithromycin Stopped (06/01/22 1329)   aztreonam 1 g (06/02/22 0516)   feeding supplement (VITAL AF 1.2 CAL) 30 mL/hr at 06/01/22 1538   fentaNYL infusion INTRAVENOUS 150 mcg/hr (06/02/22 0020)   vancomycin Stopped (06/01/22 1535)   PRN Meds: acetaminophen **OR** acetaminophen, albuterol, fentaNYL, fentaNYL (SUBLIMAZE) injection, fentaNYL (SUBLIMAZE) injection, midazolam, ondansetron **OR** ondansetron (ZOFRAN) IV, mouth rinse, polyethylene glycol   Vital Signs    Vitals:   06/02/22 0500 06/02/22 0600 06/02/22 0700 06/02/22 0740  BP: (!) 128/45 113/60 (!) 145/71 (!) 144/62  Pulse: 62 67 74 (!) 116  Resp: (!) 22 (!) 22 (!) 22 20  Temp: 99.5 F (37.5 C) 99.9 F (37.7 C) 99.9 F (37.7 C) 99.9 F (37.7 C)  TempSrc:      SpO2: 100% 100% 100% 100%  Weight:  41.7 kg    Height:        Intake/Output Summary (Last 24 hours) at 06/02/2022 0851 Last  data filed at 06/02/2022 0543 Gross per 24 hour  Intake 1186.93 ml  Output 1800 ml  Net -613.07 ml   Filed Weights   05/31/22 0500 06/01/22 0455 06/02/22 0600  Weight: 40.9 kg 41 kg 41.7 kg    Telemetry    Currently sinus rhythm, limited episode of SVT this morning.  Personally reviewed.  ECG    No ECG reviewed.  Physical Exam   GEN: No acute distress. ET tube in place and on ventilator. Neck: No JVD. Cardiac: RRR, no murmur, rub, or gallop.  Respiratory: Nonlabored.  Decreased breath sounds with few crackles on the left. GI: Soft, nontender, bowel sounds present. MS: No edema. Neuro: Sedated.  Labs    Chemistry Recent Labs  Lab 05/28/22 1640 05/29/22 0438 05/31/22 0645 06/01/22 0424 06/02/22 0553  NA 137   < > 145 145 148*  K 4.3   < > 5.2* 4.6 4.5  CL 109   < > 117* 119* 121*  CO2 22   < > 21* 22 24  GLUCOSE 120*   < > 142* 192* 199*  BUN 17   < > 31* 45* 47*  CREATININE 1.08*   < > 1.40* 1.21* 1.02*  CALCIUM 8.6*   < > 8.4* 8.3* 8.7*  PROT 7.5  --   --   --   --   ALBUMIN 3.3*  --   --   --   --  AST 39  --   --   --   --   ALT 32  --   --   --   --   ALKPHOS 107  --   --   --   --   BILITOT 0.6  --   --   --   --   GFRNONAA 55*   < > 40* 48* 58*  ANIONGAP 6   < > 7 4* 3*   < > = values in this interval not displayed.     Hematology Recent Labs  Lab 05/31/22 0419 06/01/22 0424 06/02/22 0553  WBC 8.3 8.1 9.3  RBC 2.79* 2.63* 2.72*  HGB 9.6* 9.2* 9.5*  HCT 32.9* 30.6* 32.1*  MCV 117.9* 116.3* 118.0*  MCH 34.4* 35.0* 34.9*  MCHC 29.2* 30.1 29.6*  RDW 16.4* 16.3* 16.3*  PLT 251 194 198    Cardiac Enzymes Recent Labs  Lab 05/28/22 1640 05/28/22 2259 05/29/22 0438 05/29/22 0811 05/29/22 1012  TROPONINIHS 13 375* 478* 400* 368*    BNP Recent Labs  Lab 05/28/22 1640 05/30/22 2002  BNP 72.0 1,779.0*     DDimer Recent Labs  Lab 05/28/22 1725  DDIMER 3.97*     Radiology    DG CHEST PORT 1 VIEW  Result Date:  06/01/2022 CLINICAL DATA:  Pneumonia EXAM: PORTABLE CHEST 1 VIEW COMPARISON:  Previous studies including the examination of 05/31/2022 FINDINGS: Tip of endotracheal tube is 7 cm above the carina. Enteric tube is noted traversing the esophagus. Cardiac size is within normal limits. There is slight improvement in infiltrates in left lung. There is possible slight worsening of infiltrate in right parahilar region. Bullous emphysema is seen in right upper lung field limiting evaluation for small pneumothorax. There is no significant pleural effusion. No definite pneumothorax is seen. IMPRESSION: There is decrease in extensive interstitial infiltrates in left lung. There is slight worsening of infiltrate in right parahilar region. Severe bullous emphysema in the right upper lung field. Electronically Signed   By: Ernie Avena M.D.   On: 06/01/2022 09:17   ECHOCARDIOGRAM LIMITED  Result Date: 05/31/2022    ECHOCARDIOGRAM LIMITED REPORT   [name] Date of Exam: 05/31/2022 Medical Rec #:  [id number]         Height:       60.0 in Accession #:    [id number]        Weight:       90.2 lb Date of Birth:  1950/11/08         BSA:          1.330 m Patient Age:    70 years          BP:           125/55 mmHg Patient Gender: F                 HR:           101 bpm. [facility] Procedure: Limited Echo Indications:    CHF-Acute Systolic I50.21  History:        Patient has prior history of Echocardiogram examinations, most                 recent 05/29/2022. Previous Myocardial Infarction and CAD, COPD                 and Stroke; Risk Factors:Hypertension and Dyslipidemia. Hx of  CKD, COVID-10.  Sonographer:    Celesta Gentile RCS Referring Phys: [phone] DAVID TAT  Sonographer Comments: *Patient on mechanical ventilator at present. IMPRESSIONS  1. Left ventricular ejection fraction, by estimation, is 35 to 40%. The left ventricle has moderately decreased function. The left ventricle  demonstrates regional wall motion abnormalities (see scoring diagram/findings for description). The mid septal and all apical LV segments are akinetic (consistent with prior TTE).  2. Right ventricular systolic function is mildly reduced. The right ventricular size is normal.  3. The mitral valve is degenerative. Moderate mitral annular calcification. Comparison(s): Compared to prior TTE on 05/29/22, there is no significant change. FINDINGS  Left Ventricle: The mid septal and all apical LV segments are akinetic. Left ventricular ejection fraction, by estimation, is 35 to 40%. The left ventricle has moderately decreased function. The left ventricle demonstrates regional wall motion abnormalities. The left ventricular internal cavity size was normal in size. There is no left ventricular hypertrophy. Right Ventricle: The right ventricular size is normal. Right ventricular systolic function is mildly reduced. Pericardium: There is no evidence of pericardial effusion. Mitral Valve: The mitral valve is degenerative in appearance. There is mild thickening of the mitral valve leaflet(s). There is mild calcification of the mitral valve leaflet(s). Moderate mitral annular calcification. Aorta: The aortic root is normal in size and structure. LEFT VENTRICLE PLAX 2D LVIDd:         3.90 cm LVIDs:         3.30 cm LV PW:         0.90 cm LV IVS:        0.90 cm LVOT diam:     1.70 cm LVOT Area:     2.27 cm  LV Volumes (MOD) LV vol d, MOD A2C: 38.7 ml LV vol d, MOD A4C: 50.3 ml LV vol s, MOD A2C: 21.4 ml LV vol s, MOD A4C: 26.8 ml LV SV MOD A2C:     17.3 ml LV SV MOD A4C:     50.3 ml LV SV MOD BP:      20.7 ml LEFT ATRIUM         Index LA diam:    3.20 cm 2.41 cm/m   AORTA Ao Root diam: 2.50 cm  SHUNTS Systemic Diam: 1.70 cm Laurance Flatten MD Electronically signed by Laurance Flatten MD Signature Date/Time: 05/31/2022/1:31:58 PM    Final      Assessment & Plan    1.  HFrEF, newly documented with LVEF 35 to 40% and wall  motion abnormalities suggesting possibility of stress-induced cardiomyopathy or LAD distribution infarct.  Complicating picture is recent COVID-19 with current hypoxic respiratory failure due to pneumonia and underlying severe COPD.  Hemodynamically stable at this time.   2.  History of CAD with reported inferior infarct and PTCA with stent to the RCA in 1997, NSTEMI in 2004 with documentation of occluded RCA that was managed medically.  Current high-sensitivity troponin I levels 300-400 range, not necessarily consistent with ACS or in line with degree of LV dysfunction in terms of an acute event.  She completed 48-hour course of IV heparin.  Currently on Plavix per tube and standing dose IV Lopressor.   3.  Fluctuating anemia, hemoglobin up to 9.5 this morning.  No obvious active bleeding.   4.  Acute hypoxic and hypercarbic respiratory failure with lobar pneumonia, MRSA positive, recent COVID-19 infection as well, and underlying severe COPD.  Remains on the ventilator and on broad-spectrum antibiotics.  Pulmonary/CCM is following.  5.  Possible CKD stage IIIa, creatinine down to 1.02.  Continue supportive measures.  If she shows clinical improvement in terms of pulmonary status and comes off ventilator, further discussion can be had regarding a diagnostic cardiac catheterization to evaluate coronary anatomy.  No changes made to present cardiac regimen.  Allergies to be considered in the process of her coronary evaluation include aspirin, IV contrast media, and lisinopril.  Signed, Rozann Lesches, MD  06/02/2022, 8:51 AM

## 2022-06-02 NOTE — Assessment & Plan Note (Addendum)
-   Continue to maintain adequate amount of free water intake. -Follow electrolytes. -Sodium 148.

## 2022-06-02 NOTE — Progress Notes (Signed)
NAME:  [name], MRN:  [id number], DOB:  09/09/1951, LOS: 5 ADMISSION DATE:  05/28/2022, CONSULTATION DATE:  05/29/2022 REFERRING MD:  Dr. Arbutus Leas, [facility], CHIEF COMPLAINT:  Dyspnea   History of Present Illness:  71 yo female former smoker presented to [facility] ER with dyspnea.  SpO2 77% on room air.  Admitted  8/12 to 8/19 with COVID 19 infection with AKI and colitis, but didn't need supplemental oxygen at discharge.  Her breathing has gotten progressive worse since hospital discharge.  Her chest xray showed hyperinflation with bullous changes.  She was started on supplemental oxygen and Bipap, and started on therapy for possible COPD exacerbation.  PCCM asked to assist with respiratory management in ICU.  Pertinent  Medical History  Anxiety, Blindness Rt eye, CVA, GERD, HLD, HTN, Depression, CAD s/p stent, CKD 3a  Significant Hospital Events: Including procedures, antibiotic start and stop dates in addition to other pertinent events   9/07 Admit, start on Bipap 9/08 Cardiology consulted for elevated troponin, start heparin gtt 9/8 rx zmax/vanc/aztreonam>>>  ET 9/9 >>>  Studies:  Echo 9/08 >-EF 35-40%, apical HK, trivial MR/TR c/w Takatsubo's  CT angio chest 9/08 >> centrilobular/paraseptal emphysema with bullous emphysema RUL, patchy infiltrate RUL/RML/RLL/LLL, small Rt effusion MRSA PCR screen 9/8  POS   Interim History / Subjective:  Very agitated/ asynchronous with fentanyl wean  Objective   BP (!) 142/65   Pulse 75   Temp 99.9 F (37.7 C)   Resp (!) 22   Ht 5' (1.524 m)   Wt 41.7 kg   SpO2 100%   BMI 17.95 kg/m   Vent Mode: PRVC FiO2 (%):  [40 %] 40 % Set Rate:  [22 bmp] 22 bmp Vt Set:  [370 mL] 370 mL PEEP:  [5 cmH20] 5 cmH20 Plateau Pressure:  [13 cmH20-17 cmH20] 14 cmH20   Intake/Output Summary (Last 24 hours) at 06/02/2022 1335 Last data filed at 06/02/2022 1215 Gross per 24 hour  Intake 1347.03 ml  Output 1500 ml  Net -152.97 ml   Filed Weights   05/31/22  0500 06/01/22 0455 06/02/22 0600  Weight: 40.9 kg 41 kg 41.7 kg    Examination: Tmax:  101.1 General appearance:    elderly bf/ sedated on fent drip not breating over backup rate  No jvd Oropharynx et in place Neck supple Lungs with min distant scattered exp > insp rhonchi bilaterally RRR no s3 or or sign murmur Abd soft with nl excursion  Extr warm with no edema or clubbing noted Neuro  Sensorium sedated ,  no apparent motor deficits    I personally reviewed images and agree with radiology impression as follows:  CXR:   portale 9/11 decrease in extensive interstitial infiltrates in left lung. There is slight worsening of infiltrate in right parahilar region. Severe bullous emphysema in the right upper lung field.  Resolved Hospital Problem list     Assessment & Plan:   Acute hypoxic/hypercapnic respiratory failure from COPD exacerbation and pneumonia after recent COVID 19 infection in setting of bullous emphysema. - PCT trending in wrong direction despite good coverage for HCAP  >>> fever/ PCT w/u per [facility], nothing on cxr 9/11 to suggest pulmonary source  >>> difficulty with vent synchrony/ not really weanable on this sedation protocol so added precedex pm 9/12 and wean down/ off fentanyl if tol > extubate if looks great, otherwise wait until am 9/13          Elevated troponin from demand ischemia. Hx of CAD,  HTN. -9/8 echo--EF 35-40%, apical HK, trivial MR/TR c/w Takatsubo's  >>> rx per cards   CKD 3a with AKI, improved  Lab Results  Component Value Date   CREATININE 1.02 (H) 06/02/2022   CREATININE 1.21 (H) 06/01/2022   CREATININE 1.40 (H) 05/31/2022    >>> keep euvolemic,  monitor urine outpt  Macrocytic anemia. - f/u CBC  Pulmonary cachexia. -dietician addressing nutritional needs  Hypernatremia/ worsening pre-renal azotemia >>> hypotonic fluds   Best Practice (right click and "Reselect all SmartList Selections" daily)    Per [facility]   Labs    CBC: Recent Labs  Lab 05/28/22 1640 05/29/22 0438 05/30/22 0525 05/30/22 2002 05/31/22 0419 06/01/22 0424 06/02/22 0553  WBC 6.3   < > 5.7 10.3 8.3 8.1 9.3  NEUTROABS 3.7  --   --   --   --   --   --   HGB 8.6*   < > 9.2* 9.3* 9.6* 9.2* 9.5*  HCT 28.5*   < > 31.3* 31.2* 32.9* 30.6* 32.1*  MCV 115.4*   < > 117.7* 118.6* 117.9* 116.3* 118.0*  PLT 341   < > 349 278 251 194 198   < > = values in this interval not displayed.    Basic Metabolic Panel: Recent Labs  Lab 05/30/22 0525 05/31/22 0419 05/31/22 0645 06/01/22 0424 06/02/22 0553  NA 142 145 145 145 148*  K 5.6* 5.6* 5.2* 4.6 4.5  CL 118* 117* 117* 119* 121*  CO2 19* 22 21* 22 24  GLUCOSE 193* 137* 142* 192* 199*  BUN 19 29* 31* 45* 47*  CREATININE 1.22* 1.34* 1.40* 1.21* 1.02*  CALCIUM 8.7* 8.6* 8.4* 8.3* 8.7*  MG  --  1.8  --  1.9 2.0  PHOS  --   --   --  2.2*  --    GFR: Estimated Creatinine Clearance: 32.8 mL/min (A) (by C-G formula based on SCr of 1.02 mg/dL (H)). Recent Labs  Lab 05/29/22 0438 05/30/22 0525 05/30/22 2002 05/31/22 0419 06/01/22 0424 06/02/22 0553  PROCALCITON 0.64  --   --  2.06 2.19  --   WBC 5.6   < > 10.3 8.3 8.1 9.3   < > = values in this interval not displayed.    Liver Function Tests: Recent Labs  Lab 05/28/22 1640  AST 39  ALT 32  ALKPHOS 107  BILITOT 0.6  PROT 7.5  ALBUMIN 3.3*   No results for input(s): "LIPASE", "AMYLASE" in the last 168 hours. No results for input(s): "AMMONIA" in the last 168 hours.  ABG    Component Value Date/Time   PHART 7.34 (L) 06/01/2022 0753   PCO2ART 43 06/01/2022 0753   PO2ART 121 (H) 06/01/2022 0753   HCO3 23.2 06/01/2022 0753   ACIDBASEDEF 2.8 (H) 06/01/2022 0753   O2SAT 99.5 06/01/2022 0753     Coagulation Profile: No results for input(s): "INR", "PROTIME" in the last 168 hours.  Cardiac Enzymes: No results for input(s): "CKTOTAL", "CKMB", "CKMBINDEX", "TROPONINI" in the last 168 hours.  HbA1C: Hgb A1c MFr Bld   Date/Time Value Ref Range Status  05/07/2022 04:58 PM 5.9 (H) 4.8 - 5.6 % Final    Comment:    (NOTE) Pre diabetes:          5.7%-6.4%  Diabetes:              >6.4%  Glycemic control for   <7.0% adults with diabetes   09/07/2011 04:40 AM 6.0 (H) <5.7 % Final  Comment:    (NOTE)                                                                       According to the ADA Clinical Practice Recommendations for 2011, when HbA1c is used as a screening test:  >=6.5%   Diagnostic of Diabetes Mellitus           (if abnormal result is confirmed) 5.7-6.4%   Increased risk of developing Diabetes Mellitus References:Diagnosis and Classification of Diabetes Mellitus,Diabetes Care,2011,34(Suppl 1):S62-S69 and Standards of Medical Care in         Diabetes - 2011,Diabetes Care,2011,34 (Suppl 1):S11-S61.    CBG: Recent Labs  Lab 06/01/22 1529 06/01/22 1925 06/01/22 2341 06/02/22 0349 06/02/22 1114  GLUCAP 188* 154* 191* 178* 201*        The patient is critically ill with multiple organ systems failure and requires high complexity decision making for assessment and support, frequent evaluation and titration of therapies, application of advanced monitoring technologies and extensive interpretation of multiple databases. Critical Care Time devoted to patient care services described in this note is 35  minutes.   Sandrea Hughs, MD Pulmonary and Critical Care Medicine [facility] Cell [phone]   After 7:00 pm call Elink  [phone]

## 2022-06-02 NOTE — Progress Notes (Signed)
PROGRESS NOTE  HAWRAA KOBZA [id number] DOB: [birth year] DOA: 05/28/2022 PCP: Carrolyn Meiers, MD  Brief History:  71 year old female with a history of coronary disease, hypertension, hyperlipidemia, functional constipation, B12 and folate deficiency, anxiety, and prior stroke presenting with shortness of breath and respiratory failure.  The patient was recently mid to the hospital from 05/02/2022 to 05/09/2022 when she had acute respiratory failure secondary to COVID-19.  During that hospitalization, the patient was also noted to have colitis for which she was treated with levofloxacin and metronidazole.  She completed a course of antibiotics prior to discharge.  She was discharged home with a prednisone taper. She states that she has had some shortness of breath since discharge from the hospital, but this has significantly worsened in the 24 hours prior to admission.  Upon arrival to the emergency department, the patient was noted to have respiratory distress patient was placed on BiPAP. Repeat ABG on BiPAP did not show significant improvement.  There was plans for intubation, but the patient was given Ativan with improvement clinically.  She remained on BiPAP overnight.  Notably, the patient states that she started having some burning chest discomfort on the morning of 05/28/2022.  She states that it somewhat improved while she was on BiPAP, but continues to have the chest discomfort.  She was taken off of BiPAP in the morning of 05/29/2022 and stated that she had some worsening of her burning chest discomfort.  She developed some increased shortness of breath resulting in placement back on BiPAP on the morning of 05/29/22.  Troponins were noted to be 375>> 478.  D-dimer was also noted to be elevated 3.97.  The patient was started on IV heparin. The patient was given Solu-Medrol 125 mg IV, morphine 1 mg IV and placed back on BiPAP.  Repeat EKG showed sinus tachycardia with T wave  inversion V2-V5.  Troponins continue to be cycled. cardiology was consulted to assist with management.   On the evening of 05/30/2022, the patient developed respiratory distress and hypoxia on BiPAP.  She remained agitated despite pharmacologic and nonpharmacologic interventions.  After discussion with the patient's family, decision was made to intubate the patient.  Repeat limited echocardiogram was ordered for 05/31/22.     Assessment and Plan: * Acute respiratory failure with hypoxia and hypercarbia (HCC) - Presented with respiratory distress with oxygen saturation 77% on room air -due to COPD exacerbation and pneumonia -Placed back on BiPAP 05/29/22 am -05/30/22 evening--agitation/resp distress>>intubated -CTA chest--neg PE, severe emphysema; multifocal patchy infiltrates RUL, RLL, LLL -personally reviewed CXR>>patchy interstitial L-lung opacities>R -wean to extubation  (fio2 down to 0.4)  COPD with acute exacerbation ([place]) Although the patient denies a history of COPD and has not had formal PFTs--> she endorses smoking over 30 years, 1/2 pack/day -Quit smoking 7 years ago -Continue Yupelri/Brovana -Continue IV Solu-Medrol -Continue Pulmicort  Elevated troponin Suspect demand ischemia -finished IV heparin x 48 hours total -Cardiology consult appreciated>>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 -Patient does complain of chest burning -Personally reviewed EKG--sinus rhythm, T wave depression V2-V5 -9/8 echo--EF 35-40%, apical HK, trivial MR/TR -9/10--repeat limited Echo-EF 35-40%, unchanged -9/10 personally reviewed EKG--sinus TWI V4-V6 essentially unchanged -9/11 started plavix (ASA allergy) when IV heparin finished -appreciate cardiology>>consider cath once extubated and remains stable  Lobar pneumonia (HCC) CTA chest as discussed above MRSA+ Continue vanc/aztreonam/azithro PCT 0.64 Obtain tracheal aspirate for culture>>negative  Hypernatremia Increase free water flushes to q 4  hours  Impaired glucose  tolerance Start ISS Due to steroids 05/07/22 A1C--5.4  Hypophosphatemia replete  Hyperkalemia Given lokelma  Cardiomyopathy [facility]) -9/8 echo--EF 35-40%, apical HK, trivial MR/TR Discussed with cardiology>>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  Essential hypertension Restart metoprolol--give as IV while patient is on vent  Family Communication: son updated 9/11   Consultants:  pulm, cardiology   Code Status:  FULL    DVT Prophylaxis:  [place] Heparin      Procedures: As Listed in Progress Note Above   Antibiotics: Vanc 9/8>> Aztreonam 9/8>> Azithro 9/8>>         Subjective: Intubated and sedated.  No distress.  Objective: Vitals:   06/02/22 1116 06/02/22 1152 06/02/22 1200 06/02/22 1300  BP: (!) 138/50  (!) 141/50 (!) 142/65  Pulse: 81  72 75  Resp: 15  (!) 22 (!) 22  Temp: 99.5 F (37.5 C)  100 F (37.8 C) 99.9 F (37.7 C)  TempSrc:      SpO2: 100% 100% 100% 100%  Weight:      Height:        Intake/Output Summary (Last 24 hours) at 06/02/2022 1418 Last data filed at 06/02/2022 1215 Gross per 24 hour  Intake 1347.03 ml  Output 1500 ml  Net -152.97 ml   Weight change: 0.7 kg Exam:  General:  Pt is alert, follows commands appropriately, not in acute distress HEENT: No icterus, No thrush, No neck mass, [place]/AT Cardiovascular: RRR, S1/S2, no  rubs, no gallops Respiratory: bibasilar rales.  No wheeze Abdomen: Soft/+BS, non tender, non distended, no guarding Extremities: No edema, No lymphangitis, No petechiae, No rashes, no synovitis   Data Reviewed: I have personally reviewed following labs and imaging studies Basic Metabolic Panel: Recent Labs  Lab 05/30/22 0525 05/31/22 0419 05/31/22 0645 06/01/22 0424 06/02/22 0553  NA 142 145 145 145 148*  K 5.6* 5.6* 5.2* 4.6 4.5  CL 118* 117* 117* 119* 121*  CO2 19* 22 21* 22 24  GLUCOSE 193* 137* 142* 192* 199*  BUN 19 29* 31* 45* 47*  CREATININE 1.22* 1.34* 1.40* 1.21* 1.02*  CALCIUM 8.7* 8.6* 8.4* 8.3* 8.7*  MG  --  1.8  --  1.9 2.0  PHOS  --   --   --  2.2*  --    Liver Function Tests: Recent Labs  Lab 05/28/22 1640  AST 39  ALT 32  ALKPHOS 107  BILITOT 0.6  PROT 7.5  ALBUMIN 3.3*   No results for input(s): "LIPASE", "AMYLASE" in the last 168 hours. No results for input(s): "AMMONIA" in the last 168 hours. Coagulation Profile: No results for input(s): "INR", "PROTIME" in the last 168 hours. CBC: Recent Labs  Lab 05/28/22 1640 05/29/22 0438 05/30/22 0525 05/30/22 2002 05/31/22 0419 06/01/22 0424 06/02/22 0553  WBC 6.3   < > 5.7 10.3 8.3 8.1 9.3  NEUTROABS 3.7  --   --   --   --   --   --  HGB 8.6*   < > 9.2* 9.3* 9.6* 9.2* 9.5*  HCT 28.5*   < > 31.3* 31.2* 32.9* 30.6* 32.1*  MCV 115.4*   < > 117.7* 118.6* 117.9* 116.3* 118.0*  PLT 341   < > 349 278 251 194 198   < > = values in this interval not displayed.   Cardiac Enzymes: No results for input(s): "CKTOTAL", "CKMB", "CKMBINDEX", "TROPONINI" in the last 168 hours. BNP: Invalid input(s): "POCBNP" CBG: Recent Labs  Lab 06/01/22 1529 06/01/22 1925 06/01/22 2341 06/02/22 0349 06/02/22 1114  GLUCAP 188* 154* 191* 178* 201*   HbA1C: No results for input(s): "HGBA1C" in the last 72 hours. Urine analysis:    Component Value Date/Time   COLORURINE YELLOW 05/02/2022 0711   APPEARANCEUR HAZY  (A) 05/02/2022 0711   LABSPEC 1.014 05/02/2022 0711   PHURINE 6.0 05/02/2022 0711   GLUCOSEU NEGATIVE 05/02/2022 0711   HGBUR SMALL (A) 05/02/2022 0711   BILIRUBINUR NEGATIVE 05/02/2022 0711   KETONESUR NEGATIVE 05/02/2022 0711   PROTEINUR >=300 (A) 05/02/2022 0711   UROBILINOGEN 1.0 07/16/2014 0244   NITRITE NEGATIVE 05/02/2022 0711   LEUKOCYTESUR NEGATIVE 05/02/2022 0711   Sepsis Labs: @LABRCNTIP (procalcitonin:4,lacticidven:4) ) Recent Results (from the past 240 hour(s))  Resp Panel by RT-PCR (Flu A&B, Covid) Anterior Nasal Swab     Status: None   Collection Time: 05/28/22  4:27 PM   Specimen: Anterior Nasal Swab  Result Value Ref Range Status   SARS Coronavirus 2 by RT PCR NEGATIVE NEGATIVE Final    Comment: (NOTE) SARS-CoV-2 target nucleic acids are NOT DETECTED.  The SARS-CoV-2 RNA is generally detectable in upper respiratory specimens during the acute phase of infection. The lowest concentration of SARS-CoV-2 viral copies this assay can detect is 138 copies/mL. A negative result does not preclude SARS-Cov-2 infection and should not be used as the sole basis for treatment or other patient management decisions. A negative result may occur with  improper specimen collection/handling, submission of specimen other than nasopharyngeal swab, presence of viral mutation(s) within the areas targeted by this assay, and inadequate number of viral copies(<138 copies/mL). A negative result must be combined with clinical observations, patient history, and epidemiological information. The expected result is Negative.  Fact Sheet for Patients:  07/28/22  Fact Sheet for Healthcare Providers:  BloggerCourse.com  This test is no t yet approved or cleared by the [employer] FDA and  has been authorized for detection and/or diagnosis of SARS-CoV-2 by FDA under an Emergency Use Authorization (EUA). This EUA will remain  in effect  (meaning this test can be used) for the duration of the COVID-19 declaration under Section 564(b)(1) of the Act, 21 U.S.C.section 360bbb-3(b)(1), unless the authorization is terminated  or revoked sooner.       Influenza A by PCR NEGATIVE NEGATIVE Final   Influenza B by PCR NEGATIVE NEGATIVE Final    Comment: (NOTE) The Xpert Xpress SARS-CoV-2/FLU/RSV plus assay is intended as an aid in the diagnosis of influenza from Nasopharyngeal swab specimens and should not be used as a sole basis for treatment. Nasal washings and aspirates are unacceptable for Xpert Xpress SARS-CoV-2/FLU/RSV testing.  Fact Sheet for Patients: [place]  Fact Sheet for Healthcare Providers: BloggerCourse.com  This test is not yet approved or cleared by the [employer] FDA and has been authorized for detection and/or diagnosis of SARS-CoV-2 by FDA under an Emergency Use Authorization (EUA). This EUA will remain in effect (meaning this test can be used) for the duration of the COVID-19  declaration under Section 564(b)(1) of the Act, 21 U.S.C. section 360bbb-3(b)(1), unless the authorization is terminated or revoked.  Performed at [facility], [address] [place], [place] [address]   MRSA Next Gen by PCR, Nasal     Status: Abnormal   Collection Time: 05/29/22  2:27 AM   Specimen: Nasal Mucosa; Nasal Swab  Result Value Ref Range Status   MRSA by PCR Next Gen DETECTED (A) NOT DETECTED Final    Comment: RESULT CALLED TO, READ BACK BY AND VERIFIED WITH: MILLS M @ 1106 ON [id number] BY HENDERSON L (NOTE) The GeneXpert MRSA Assay (FDA approved for NASAL specimens only), is one component of a comprehensive MRSA colonization surveillance program. It is not intended to diagnose MRSA infection nor to guide or monitor treatment for MRSA infections. Test performance is not FDA approved in patients less than 62 years old. Performed at [facility], [address] [place], [place] [address]   Culture, Respiratory w Gram Stain     Status: None   Collection Time: 05/31/22  8:00 AM   Specimen: Tracheal Aspirate; Respiratory  Result Value Ref Range Status   Specimen Description   Final    TRACHEAL ASPIRATE Performed at [facility], [address] [place], [place] [address]    Special Requests   Final    NONE Performed at [facility], [address], [place], [place] [address]    Gram Stain NO WBC SEEN NO ORGANISMS SEEN   Final   Culture   Final    NO GROWTH Performed at [facility], [address], [place], [place] [address]    Report Status 06/02/2022 FINAL  Final     Scheduled Meds:  arformoterol  15 mcg Nebulization BID   budesonide (PULMICORT) nebulizer solution  0.5 mg Nebulization BID   Chlorhexidine Gluconate Cloth  6 each Topical Q0600   clopidogrel  75 mg Per Tube Daily   feeding supplement (PROSource TF20)  60 mL Per Tube BID   free water  200 mL Per Tube Q4H   heparin  5,000 Units Subcutaneous Q8H   methylPREDNISolone (SOLU-MEDROL) injection  40 mg Intravenous Q12H   metoprolol tartrate  7.5 mg Intravenous Q6H   mupirocin ointment   Nasal BID   mouth rinse  15 mL Mouth Rinse Q2H   pantoprazole (PROTONIX) IV  40 mg Intravenous Q12H   phosphorus  500 mg Per Tube BID   polyethylene glycol  17 g Per Tube Daily   revefenacin  175 mcg Nebulization Daily   Continuous Infusions:  azithromycin 500 mg (06/02/22 1218)   aztreonam 1 g (06/02/22 1341)   dexmedetomidine (PRECEDEX) IV infusion 0.4 mcg/kg/hr (06/02/22 1334)   feeding supplement (VITAL AF 1.2 CAL) 30 mL/hr at 06/02/22 1215   fentaNYL infusion INTRAVENOUS 150 mcg/hr (06/02/22 1215)   vancomycin Stopped (06/01/22 1535)    Procedures/Studies: DG CHEST PORT 1 VIEW  Result Date: 06/01/2022 CLINICAL DATA:  Pneumonia EXAM: PORTABLE CHEST 1 VIEW COMPARISON:  Previous studies including the examination of 05/31/2022 FINDINGS: Tip of endotracheal tube is 7 cm above  the carina. Enteric tube is noted traversing the esophagus. Cardiac size is within normal limits. There is slight improvement in infiltrates in left lung. There is possible slight worsening of infiltrate in right parahilar region. Bullous emphysema is seen in right upper lung field limiting evaluation for small pneumothorax. There is no significant pleural effusion. No definite pneumothorax is seen. IMPRESSION: There is decrease in extensive interstitial infiltrates in  left lung. There is slight worsening of infiltrate in right parahilar region. Severe bullous emphysema in the right upper lung field. Electronically Signed   By: Elmer Picker M.D.   On: 06/01/2022 09:17   ECHOCARDIOGRAM LIMITED  Result Date: 05/31/2022    ECHOCARDIOGRAM LIMITED REPORT   Patient Name:   DYANI QUELLA Date of Exam: 05/31/2022 Medical Rec #:  [id number]         Height:       60.0 in Accession #:    [id number]        Weight:       90.2 lb Date of Birth:  Aug 08, 1951         BSA:          1.330 m Patient Age:    56 years          BP:           125/55 mmHg Patient Gender: F                 HR:           101 bpm. Exam Location:  [facility] Procedure: Limited Echo Indications:    CHF-Acute Systolic [id number]  History:        Patient has prior history of Echocardiogram examinations, most                 recent 05/29/2022. Previous Myocardial Infarction and CAD, COPD                 and Stroke; Risk Factors:Hypertension and Dyslipidemia. Hx of                 CKD, COVID-10.  Sonographer:    Alvino Chapel RCS Referring Phys: [phone] Asaiah Hunnicutt  Sonographer Comments: *Patient on mechanical ventilator at present. IMPRESSIONS  1. Left ventricular ejection fraction, by estimation, is 35 to 40%. The left ventricle has moderately decreased function. The left ventricle demonstrates regional wall motion abnormalities (see scoring diagram/findings for description). The mid septal and all apical LV segments are akinetic (consistent with prior TTE).  2.  Right ventricular systolic function is mildly reduced. The right ventricular size is normal.  3. The mitral valve is degenerative. Moderate mitral annular calcification. Comparison(s): Compared to prior TTE on 05/29/22, there is no significant change. FINDINGS  Left Ventricle: The mid septal and all apical LV segments are akinetic. Left ventricular ejection fraction, by estimation, is 35 to 40%. The left ventricle has moderately decreased function. The left ventricle demonstrates regional wall motion abnormalities. The left ventricular internal cavity size was normal in size. There is no left ventricular hypertrophy. Right Ventricle: The right ventricular size is normal. Right ventricular systolic function is mildly reduced. Pericardium: There is no evidence of pericardial effusion. Mitral Valve: The mitral valve is degenerative in appearance. There is mild thickening of the mitral valve leaflet(s). There is mild calcification of the mitral valve leaflet(s). Moderate mitral annular calcification. Aorta: The aortic root is normal in size and structure. LEFT VENTRICLE PLAX 2D LVIDd:         3.90 cm LVIDs:         3.30 cm LV PW:         0.90 cm LV IVS:        0.90 cm LVOT diam:     1.70 cm LVOT Area:     2.27 cm  LV Volumes (MOD) LV vol d, MOD A2C: 38.7 ml LV vol d, MOD A4C: 50.3 ml  LV vol s, MOD A2C: 21.4 ml LV vol s, MOD A4C: 26.8 ml LV SV MOD A2C:     17.3 ml LV SV MOD A4C:     50.3 ml LV SV MOD BP:      20.7 ml LEFT ATRIUM         Index LA diam:    3.20 cm 2.41 cm/m   AORTA Ao Root diam: 2.50 cm  SHUNTS Systemic Diam: 1.70 cm Gwyndolyn Kaufman MD Electronically signed by Gwyndolyn Kaufman MD Signature Date/Time: 05/31/2022/1:31:58 PM    Final    DG Chest 1 View  Result Date: 05/31/2022 CLINICAL DATA:  Shortness of breath EXAM: CHEST  1 VIEW COMPARISON:  05/30/2022 FINDINGS: Endotracheal tube terminates 7 cm above the carina. Multifocal patchy left lung opacities, favoring pneumonia, possibly on the basis of  aspiration. Right lung is essentially clear. No pleural effusion or pneumothorax. The heart is normal in size. Enteric tube courses into the stomach. Cholecystectomy clips. IMPRESSION: Endotracheal tube terminates 7 cm above the carina. Multifocal patchy left lung opacities, favoring pneumonia, possibly on the basis of aspiration. Electronically Signed   By: Julian Hy M.D.   On: 05/31/2022 03:54   DG CHEST PORT 1 VIEW  Result Date: 05/30/2022 CLINICAL DATA:  Endotracheal and orogastric tube. EXAM: PORTABLE CHEST 1 VIEW COMPARISON:  Chest CT 05/29/2022.  Chest x-ray 05/30/2009 3. FINDINGS: Endotracheal tube tip is 5 cm above the carina. Enteric tube tip is in the mid stomach. There are increasing central interstitial and airspace opacities throughout the left lung. Severe emphysematous changes are again seen in the right upper lobe. There is no pleural effusion or pneumothorax. No acute fractures are seen. Cardiomediastinal silhouette is within normal limits. IMPRESSION: 1. Endotracheal tube tip 5 cm above carina. 2. Enteric tube tip at the level of the mid stomach. 3. Increasing left lung interstitial and airspace opacities which may represent edema or infection. 4. Stable severe emphysema. Electronically Signed   By: Ronney Asters M.D.   On: 05/30/2022 19:50   DG CHEST PORT 1 VIEW  Result Date: 05/30/2022 CLINICAL DATA:  Respiratory failure, pneumonia EXAM: PORTABLE CHEST 1 VIEW COMPARISON:  Previous studies including chest radiograph done on 05/28/2022 and CT done on 05/29/2022 FINDINGS: There is diffuse increase in patchy interstitial and alveolar densities in both lungs, more so on the left side. There is relative sparing of right upper lung field due to underlying bullous emphysema. There is blunting of both lateral CP angles. There is no pneumothorax. IMPRESSION: There is diffuse increase in interstitial and alveolar markings in both lungs, more so on the left side. Findings suggest asymmetric  pulmonary edema or worsening multifocal bilateral pneumonia. Small bilateral pleural effusions. Electronically Signed   By: Elmer Picker M.D.   On: 05/30/2022 10:05   ECHOCARDIOGRAM COMPLETE  Result Date: 05/29/2022    ECHOCARDIOGRAM REPORT   Patient Name:   JAN BRIST Date of Exam: 05/29/2022 Medical Rec #:  [id number]         Height:       60.0 in Accession #:    [id number]        Weight:       88.2 lb Date of Birth:  Apr 27, 1951         BSA:          1.318 m Patient Age:    38 years          BP:  143/66 mmHg Patient Gender: F                 HR:           113 bpm. Exam Location:  [facility] Procedure: 2D Echo, Cardiac Doppler and Color Doppler Indications:    Elevated Troponin  History:        Patient has prior history of Echocardiogram examinations, most                 recent 04/14/2018. CAD and Previous Myocardial Infarction, COPD                 and Stroke; Risk Factors:Hypertension and Dyslipidemia. Hx of                 CKD, COVID-10.  Sonographer:    Alvino Chapel RCS Referring Phys: [phone] Jalaysia Lobb  Sonographer Comments: ** Patient on BiPAP and moving Alot during echo. IMPRESSIONS  1. Left ventricular ejection fraction, by estimation, is 35 to 40%. The left ventricle has moderately decreased function. The left ventricle demonstrates regional wall motion abnormalities (see scoring diagram/findings for description). Left ventricular  diastolic parameters are indeterminate.  2. Right ventricular systolic function is mildly reduced. The right ventricular size is normal. There is moderately elevated pulmonary artery systolic pressure. The estimated right ventricular systolic pressure is 99991111 mmHg.  3. The mitral valve is degenerative. Trivial mitral valve regurgitation.  4. The aortic valve was not well visualized. Aortic valve regurgitation is not visualized. No aortic stenosis is present.  5. The inferior vena cava is dilated in size with <50% respiratory variability, suggesting right  atrial pressure of 15 mmHg. Conclusion(s)/Recommendation(s): Normal systolic function at base with apical akinesis, differential includes LAD infarct vs stress induced cardiomyopathy. FINDINGS  Left Ventricle: Left ventricular ejection fraction, by estimation, is 35 to 40%. The left ventricle has moderately decreased function. The left ventricle demonstrates regional wall motion abnormalities. The left ventricular internal cavity size was normal in size. There is no left ventricular hypertrophy. Left ventricular diastolic parameters are indeterminate.  LV Wall Scoring: The mid and distal anterior septum, entire apex, and mid inferoseptal segment are akinetic. The anterior wall, antero-lateral wall, inferior wall, posterior wall, basal anteroseptal segment, and basal inferoseptal segment are normal. Right Ventricle: The right ventricular size is normal. Right vetricular wall thickness was not well visualized. Right ventricular systolic function is mildly reduced. There is moderately elevated pulmonary artery systolic pressure. The tricuspid regurgitant velocity is 3.00 m/s, and with an assumed right atrial pressure of 15 mmHg, the estimated right ventricular systolic pressure is 99991111 mmHg. Left Atrium: Left atrial size was normal in size. Right Atrium: Right atrial size was normal in size. Pericardium: There is no evidence of pericardial effusion. Mitral Valve: The mitral valve is degenerative in appearance. Trivial mitral valve regurgitation. Tricuspid Valve: The tricuspid valve is normal in structure. Tricuspid valve regurgitation is trivial. Aortic Valve: The aortic valve was not well visualized. Aortic valve regurgitation is not visualized. No aortic stenosis is present. Pulmonic Valve: The pulmonic valve was not well visualized. Pulmonic valve regurgitation is not visualized. Aorta: The aortic root is normal in size and structure. Venous: The inferior vena cava is dilated in size with less than 50% respiratory  variability, suggesting right atrial pressure of 15 mmHg. IAS/Shunts: The interatrial septum was not well visualized.  LEFT VENTRICLE PLAX 2D LVIDd:         3.60 cm LVIDs:  2.50 cm LV PW:         0.90 cm LV IVS:        0.90 cm LVOT diam:     1.70 cm LV SV:         37 LV SV Index:   28 LVOT Area:     2.27 cm  LV Volumes (MOD) LV vol d, MOD A2C: 51.2 ml LV vol d, MOD A4C: 57.0 ml LV vol s, MOD A2C: 30.1 ml LV vol s, MOD A4C: 32.9 ml LV SV MOD A2C:     21.1 ml LV SV MOD A4C:     57.0 ml LV SV MOD BP:      24.6 ml RIGHT VENTRICLE TAPSE (M-mode): 1.2 cm LEFT ATRIUM             Index        RIGHT ATRIUM           Index LA diam:        2.60 cm 1.97 cm/m   RA Area:     10.40 cm LA Vol (A2C):   32.4 ml 24.58 ml/m  RA Volume:   25.30 ml  19.20 ml/m LA Vol (A4C):   32.5 ml 24.66 ml/m LA Biplane Vol: 35.2 ml 26.71 ml/m  AORTIC VALVE LVOT Vmax:   88.30 cm/s LVOT Vmean:  55.300 cm/s LVOT VTI:    0.164 m  AORTA Ao Root diam: 2.50 cm MITRAL VALVE                TRICUSPID VALVE MV Area (PHT): 7.16 cm     TR Peak grad:   36.0 mmHg MV Decel Time: 106 msec     TR Vmax:        300.00 cm/s MV E velocity: 136.00 cm/s                             SHUNTS                             Systemic VTI:  0.16 m                             Systemic Diam: 1.70 cm Oswaldo Milian MD Electronically signed by Oswaldo Milian MD Signature Date/Time: 05/29/2022/2:52:39 PM    Final    CT Angio Chest Pulmonary Embolism (PE) W or WO Contrast  Result Date: 05/29/2022 CLINICAL DATA:  Shortness of breath x2 weeks EXAM: CT ANGIOGRAPHY CHEST WITH CONTRAST TECHNIQUE: Multidetector CT imaging of the chest was performed using the standard protocol during bolus administration of intravenous contrast. Multiplanar CT image reconstructions and MIPs were obtained to evaluate the vascular anatomy. RADIATION DOSE REDUCTION: This exam was performed according to the departmental dose-optimization program which includes automated exposure control,  adjustment of the mA and/or kV according to patient size and/or use of iterative reconstruction technique. CONTRAST:  70mL OMNIPAQUE IOHEXOL 350 MG/ML SOLN COMPARISON:  Previous studies including the chest radiograph done on 05/28/2022 FINDINGS: Cardiovascular: There is homogeneous enhancement in thoracic aorta. There are no intraluminal filling defects in central pulmonary artery branches. There are scattered coronary artery calcifications. Mediastinum/Nodes: No significant lymphadenopathy seen. There are small pockets of air in right subclavian vein, possibly introduced during venipuncture. Lungs/Pleura: Centrilobular and panlobular emphysema is seen. 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 There is small patchy  infiltrate in the lateral aspect of posterior segment of right upper lobe. There is thickening of interlobular septi in the lower lung fields. There are patchy infiltrates in right middle lobe and both lower lobes. Small right pleural effusion is seen. There is minimal left pleural effusion. There is no pneumothorax. Upper Abdomen: There is 5.1 cm smooth marginated fluid density lesion in the upper pole of left kidney suggesting possible cyst. No follow-up is recommended. There are a few low-density lesions in liver measuring up to 11 mm in size which are not fully characterized, most likely cysts. No follow-up imaging is recommended. Musculoskeletal: No acute findings are seen. Review of the MIP images confirms the above findings. IMPRESSION: There is no evidence of pulmonary artery embolism. There is no evidence of thoracic aortic dissection. Scattered coronary artery calcifications are seen. 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 There are small patchy infiltrates in posterior segment of right upper lobe, right middle lobe and both lower lobes suggesting multifocal atelectasis/pneumonia. Bilateral pleural effusions, more so on the right side. There are low-density  lesions in left kidney and liver, possibly cysts. No follow-up imaging is recommended. Electronically Signed   By: Elmer Picker M.D.   On: 05/29/2022 13:04   DG Chest Portable 1 View  Result Date: 05/28/2022 CLINICAL DATA:  Shortness of breath for 2 weeks.  COVID positive. EXAM: PORTABLE CHEST 1 VIEW COMPARISON:  05/02/2022 FINDINGS: Lungs are hyperinflated. Significant bullous changes at the RIGHT lung apex, similar to prior. There are no focal consolidations or pleural effusions. No pulmonary edema. IMPRESSION: Hyperinflation of the lungs. No evidence for acute pulmonary abnormality. Electronically Signed   By: Nolon Nations M.D.   On: 05/28/2022 17:12    Orson Eva, DO  [facility]  If 7PM-7AM, please contact night-coverage [email or web address] Password [email or web address] 06/02/2022, 2:18 PM   LOS: 5 days

## 2022-06-02 NOTE — Assessment & Plan Note (Addendum)
Start ISS Due to steroids 05/07/22 A1C--5.4

## 2022-06-02 NOTE — Progress Notes (Signed)
Pt hypertensive but bradycardic with rates in the 50's. This RN held metoprolol  and reached out to overnight coverage Dr. Thomes Dinning. Hydralazine was ordered. Wardell Heath Gerrianne Scale

## 2022-06-03 ENCOUNTER — Inpatient Hospital Stay (HOSPITAL_COMMUNITY): Payer: Medicare Other

## 2022-06-03 DIAGNOSIS — K219 Gastro-esophageal reflux disease without esophagitis: Secondary | ICD-10-CM

## 2022-06-03 DIAGNOSIS — I428 Other cardiomyopathies: Secondary | ICD-10-CM | POA: Diagnosis not present

## 2022-06-03 DIAGNOSIS — I214 Non-ST elevation (NSTEMI) myocardial infarction: Secondary | ICD-10-CM | POA: Diagnosis not present

## 2022-06-03 DIAGNOSIS — J9601 Acute respiratory failure with hypoxia: Secondary | ICD-10-CM | POA: Diagnosis not present

## 2022-06-03 DIAGNOSIS — I5181 Takotsubo syndrome: Secondary | ICD-10-CM | POA: Diagnosis not present

## 2022-06-03 DIAGNOSIS — R7302 Impaired glucose tolerance (oral): Secondary | ICD-10-CM

## 2022-06-03 DIAGNOSIS — R0902 Hypoxemia: Secondary | ICD-10-CM

## 2022-06-03 DIAGNOSIS — D649 Anemia, unspecified: Secondary | ICD-10-CM | POA: Diagnosis not present

## 2022-06-03 DIAGNOSIS — E870 Hyperosmolality and hypernatremia: Secondary | ICD-10-CM

## 2022-06-03 LAB — CBC
HCT: 29.9 % — ABNORMAL LOW (ref 36.0–46.0)
Hemoglobin: 9.1 g/dL — ABNORMAL LOW (ref 12.0–15.0)
MCH: 34.3 pg — ABNORMAL HIGH (ref 26.0–34.0)
MCHC: 30.4 g/dL (ref 30.0–36.0)
MCV: 112.8 fL — ABNORMAL HIGH (ref 80.0–100.0)
Platelets: 207 10*3/uL (ref 150–400)
RBC: 2.65 MIL/uL — ABNORMAL LOW (ref 3.87–5.11)
RDW: 16.1 % — ABNORMAL HIGH (ref 11.5–15.5)
WBC: 9.9 10*3/uL (ref 4.0–10.5)
nRBC: 0.5 % — ABNORMAL HIGH (ref 0.0–0.2)

## 2022-06-03 LAB — URINE CULTURE: Culture: NO GROWTH

## 2022-06-03 LAB — BASIC METABOLIC PANEL
Anion gap: 2 — ABNORMAL LOW (ref 5–15)
BUN: 44 mg/dL — ABNORMAL HIGH (ref 8–23)
CO2: 26 mmol/L (ref 22–32)
Calcium: 8.7 mg/dL — ABNORMAL LOW (ref 8.9–10.3)
Chloride: 118 mmol/L — ABNORMAL HIGH (ref 98–111)
Creatinine, Ser: 0.84 mg/dL (ref 0.44–1.00)
GFR, Estimated: >60 mL/min (ref >60)
Glucose, Bld: 257 mg/dL — ABNORMAL HIGH (ref 70–99)
Potassium: 3.6 mmol/L (ref 3.5–5.1)
Sodium: 146 mmol/L — ABNORMAL HIGH (ref 135–145)

## 2022-06-03 LAB — GLUCOSE, CAPILLARY
Glucose-Capillary: 178 mg/dL — ABNORMAL HIGH (ref 70–99)
Glucose-Capillary: 184 mg/dL — ABNORMAL HIGH (ref 70–99)
Glucose-Capillary: 189 mg/dL — ABNORMAL HIGH (ref 70–99)
Glucose-Capillary: 200 mg/dL — ABNORMAL HIGH (ref 70–99)
Glucose-Capillary: 207 mg/dL — ABNORMAL HIGH (ref 70–99)
Glucose-Capillary: 214 mg/dL — ABNORMAL HIGH (ref 70–99)
Glucose-Capillary: 264 mg/dL — ABNORMAL HIGH (ref 70–99)

## 2022-06-03 LAB — PHOSPHORUS: Phosphorus: 2.9 mg/dL (ref 2.5–4.6)

## 2022-06-03 LAB — MAGNESIUM: Magnesium: 1.9 mg/dL (ref 1.7–2.4)

## 2022-06-03 NOTE — Progress Notes (Signed)
Pt has been restless and agitated  and sitting up in bed despite PRN versed. Fentanyl drip was restarted at 2347 on 9/12/ [employer]

## 2022-06-03 NOTE — Progress Notes (Signed)
Patient weaned for about an hour until HR increased to 165, RR 36.  Placed back on full support.

## 2022-06-03 NOTE — Inpatient Diabetes Management (Signed)
Inpatient Diabetes Program Recommendations  AACE/ADA: New Consensus Statement on Inpatient Glycemic Control   Target Ranges:  Prepandial:   less than 140 mg/dL      Peak postprandial:   less than 180 mg/dL (1-2 hours)      Critically ill patients:  140 - 180 mg/dL    Latest Reference Range & Units 06/03/22 00:12 06/03/22 04:18  Glucose-Capillary 70 - 99 mg/dL 722 (H) 575 (H)    Latest Reference Range & Units 06/02/22 03:49 06/02/22 11:14 06/02/22 16:14 06/02/22 20:07 06/02/22 22:32  Glucose-Capillary 70 - 99 mg/dL 051 (H) 833 (H) 582 (H) 154 (H) 184 (H)   Review of Glycemic Control  Diabetes history: No Outpatient Diabetes medications: NA Current orders for Inpatient glycemic control: Novolog 0-9 units Q4H; Solumedrol 40 mg Q12H, Vital @ 30 ml/hr   Inpatient Diabetes Program Recommendations:     Insulin: Please consider ordering Novolog 3 units Q4H for tube feeding coverage. If tube feeding is stopped or held then Novolog tube feeding coverage should also be stopped or held.   Thanks, Orlando Penner, RN, MSN, CDCES Diabetes Coordinator Inpatient Diabetes Program [phone] (Team Pager from 8am to 5pm)

## 2022-06-03 NOTE — Progress Notes (Signed)
PROGRESS NOTE  [name] [id number] DOB: 05-27-1951 DOA: 05/28/2022 PCP: Carrolyn Meiers, MD  Brief History:  71 year old female with a history of coronary disease, hypertension, hyperlipidemia, functional constipation, B12 and folate deficiency, anxiety, and prior stroke presenting with shortness of breath and respiratory failure.  The patient was recently mid to the hospital from 05/02/2022 to 05/09/2022 when she had acute respiratory failure secondary to COVID-19.  During that hospitalization, the patient was also noted to have colitis for which she was treated with levofloxacin and metronidazole.  She completed a course of antibiotics prior to discharge.  She was discharged home with a prednisone taper. She states that she has had some shortness of breath since discharge from the hospital, but this has significantly worsened in the 24 hours prior to admission.  Upon arrival to the emergency department, the patient was noted to have respiratory distress patient was placed on BiPAP. Repeat ABG on BiPAP did not show significant improvement.  There was plans for intubation, but the patient was given Ativan with improvement clinically.  She remained on BiPAP overnight.  Notably, the patient states that she started having some burning chest discomfort on the morning of 05/28/2022.  She states that it somewhat improved while she was on BiPAP, but continues to have the chest discomfort.  She was taken off of BiPAP in the morning of 05/29/2022 and stated that she had some worsening of her burning chest discomfort.  She developed some increased shortness of breath resulting in placement back on BiPAP on the morning of 05/29/22.  Troponins were noted to be 375>> 478.  D-dimer was also noted to be elevated 3.97.  The patient was started on IV heparin. The patient was given Solu-Medrol 125 mg IV, morphine 1 mg IV and placed back on BiPAP.  Repeat EKG showed sinus tachycardia with T wave  inversion V2-V5.  Troponins continue to be cycled. cardiology was consulted to assist with management.   On the evening of 05/30/2022, the patient developed respiratory distress and hypoxia on BiPAP.  She remained agitated despite pharmacologic and nonpharmacologic interventions.  After discussion with the patient's family, decision was made to intubate the patient.  Repeat limited echocardiogram was ordered for 05/31/22.     Assessment and Plan: * Acute respiratory failure with hypoxia and hypercarbia (HCC) - Presented with respiratory distress with oxygen saturation 77% on room air.  Appears to be secondary to COPD exacerbation and pneumonia -Patient failed the use of BiPAP and in the requiring to be intubated and mechanically ventilated.  (05/30/2022) -CTA chest--neg PE, severe emphysema; multifocal patchy infiltrates RUL, RLL, LLL -Continue supportive care, current antibiotics and SBT's. -She has failed with plans for extubation today due to agitation and increase tachypnea.  COPD with acute exacerbation ([place]) -Although the patient denies a history of COPD and has not had formal PFTs--> she endorses smoking over 30 years, 1/2 pack/day. -Quit smoking 7 years ago -Continue Yupelri/Brovana -Continue IV Solu-Medrol -Continue Pulmicort and pulmonary toiletry. -No wheezing appreciated on exam.  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 -EKG--sinus rhythm, T wave depression V2-V5 -9/8 echo--EF 35-40%, apical HK, trivial MR/TR -9/10--repeat limited Echo-EF 35-40%, unchanged -9/11 started on Plavix after finalization of heparin drip. -Allergy planning the possibility for cardiac catheterization once extubated if she remains stable.  Lobar pneumonia ([place]) -CTA chest as discussed above -MRSA+ -Continue vanc/aztreonam/azithro -PCT 0.64 -  Tracheal aspiration without any  growth. -Continue supportive care.  Hypernatremia - Continue to maintain adequate amount of free water through tube feedings -Follow electrolytes.  Impaired glucose tolerance -05/07/22 A1C--5.4 -Continue sliding scale insulin -Elevated CBGs in the setting of steroids usage and tube feedings.  Hypophosphatemia - Continue to follow ultralights and replete as needed.  Hyperkalemia -Given lokelma -Electrolytes stable currently -Continue telemetry monitoring and follow electrolytes trend.  Cardiomyopathy ([place]) -9/8 echo--EF 35-40%, apical HK, trivial MR/TR -Discussed with cardiology>>suspect Takatsubo's  -finish 48 hours IV heparin 9/11>> patient has been started on Plavix.   Repeat limited Echo EF 35-40%, unchanged  Malnutrition of moderate degree -Continue enteral feeding>>start vital 1.2 @ 30cc/hr increase free water 200 cc q 4 -Planning to provide feeding supplements once extubated.  Anemia -No signs of active blood loss -Hgb stable since admission -baseslin Hgb 10-11 -B12--674 -Folate 35.6 -Iron sat 37, ferritin 7392 -Continue to follow hemoglobin trend.  Chronic kidney disease, stage 3a ([place]) -Baseline creatinine 0.8-1.1 -Continue to follow renal function trend/stability.  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 -Continue risk factor modification.  Essential hypertension - Continue metoprolol -Given IV currently.  Family Communication: No family at bedside for updates.   Consultants:  pulm, cardiology   Code Status:  FULL    DVT Prophylaxis:  [facility] Heparin      Procedures: As Listed in Progress Note Above   Antibiotics: Vanc 9/8>> Aztreonam 9/8>> Azithro 9/8>>     Subjective: Sedated, intubated and mechanically ventilated.  Objective: Vitals:   06/03/22 1500 06/03/22 1533 06/03/22 1600 06/03/22 1700  BP: 122/69 136/84 137/72 139/71  Pulse: 86 95 87 77   Resp: (!) 22 16 (!) 22 (!) 22  Temp: 98.8 F (37.1 C) 98.8 F (37.1 C) 99.1 F (37.3 C) 99.5 F (37.5 C)  TempSrc:      SpO2: 100% 100% 100% 100%  Weight:      Height:        Intake/Output Summary (Last 24 hours) at 06/03/2022 1707 Last data filed at 06/03/2022 1528 Gross per 24 hour  Intake 1020.28 ml  Output 2025 ml  Net -1004.72 ml   Weight change: 4 kg Exam: General exam: Afebrile, no nausea or vomiting.  Sedated, intubated and mechanically ventilated.  On today's examination having difficulty to follow commands.  Patient failed SBT earlier due to tachypnea and agitation. Respiratory system: No wheezing, no crackles, good air movement bilaterally appreciated. Cardiovascular system:Rate controlled, no rubs or gallops appreciated. Gastrointestinal system: Abdomen is nondistended, soft and nontender. No organomegaly or masses felt. Normal bowel sounds heard. Central nervous system: Unable to properly assess due to sedation. Extremities: No cyanosis or clubbing. Skin: No petechiae. Psychiatry: Unable to properly assess due to sedation.    Data Reviewed: I have personally reviewed following labs and imaging studies  Basic Metabolic Panel: Recent Labs  Lab 05/31/22 0419 05/31/22 0645 06/01/22 0424 06/02/22 0553 06/03/22 0436  NA 145 145 145 148* 146*  K 5.6* 5.2* 4.6 4.5 3.6  CL 117* 117* 119* 121* 118*  CO2 22 21* 22 24 26   GLUCOSE 137* 142* 192* 199* 257*  BUN 29* 31* 45* 47* 44*  CREATININE 1.34* 1.40* 1.21* 1.02* 0.84  CALCIUM 8.6* 8.4* 8.3* 8.7* 8.7*  MG 1.8  --  1.9 2.0 1.9  PHOS  --   --  2.2*  --  2.9   Liver Function Tests: Recent Labs  Lab 05/28/22  1640  AST 39  ALT 32  ALKPHOS 107  BILITOT 0.6  PROT 7.5  ALBUMIN 3.3*   CBC: Recent Labs  Lab 05/28/22 1640 05/29/22 0438 05/30/22 2002 05/31/22 0419 06/01/22 0424 06/02/22 0553 06/03/22 0436  WBC 6.3   < > 10.3 8.3 8.1 9.3 9.9  NEUTROABS 3.7  --   --   --   --   --   --   HGB 8.6*   < >  9.3* 9.6* 9.2* 9.5* 9.1*  HCT 28.5*   < > 31.2* 32.9* 30.6* 32.1* 29.9*  MCV 115.4*   < > 118.6* 117.9* 116.3* 118.0* 112.8*  PLT 341   < > 278 251 194 198 207   < > = values in this interval not displayed.   CBG: Recent Labs  Lab 06/03/22 0012 06/03/22 0418 06/03/22 0737 06/03/22 1057 06/03/22 1632  GLUCAP 214* 207* 200* 189* 264*   HbA1C: No results for input(s): "HGBA1C" in the last 72 hours. Urine analysis:    Component Value Date/Time   COLORURINE YELLOW 06/02/2022 1637   APPEARANCEUR CLEAR 06/02/2022 1637   LABSPEC 1.016 06/02/2022 1637   PHURINE 5.0 06/02/2022 1637   GLUCOSEU NEGATIVE 06/02/2022 1637   HGBUR NEGATIVE 06/02/2022 1637   BILIRUBINUR NEGATIVE 06/02/2022 1637   KETONESUR NEGATIVE 06/02/2022 1637   PROTEINUR NEGATIVE 06/02/2022 1637   UROBILINOGEN 1.0 07/16/2014 0244   NITRITE NEGATIVE 06/02/2022 1637   LEUKOCYTESUR NEGATIVE 06/02/2022 1637   Sepsis Labs:  Recent Results (from the past 240 hour(s))  Resp Panel by RT-PCR (Flu A&B, Covid) Anterior Nasal Swab     Status: None   Collection Time: 05/28/22  4:27 PM   Specimen: Anterior Nasal Swab  Result Value Ref Range Status   SARS Coronavirus 2 by RT PCR NEGATIVE NEGATIVE Final    Comment: (NOTE) SARS-CoV-2 target nucleic acids are NOT DETECTED.  The SARS-CoV-2 RNA is generally detectable in upper respiratory specimens during the acute phase of infection. The lowest concentration of SARS-CoV-2 viral copies this assay can detect is 138 copies/mL. A negative result does not preclude SARS-Cov-2 infection and should not be used as the sole basis for treatment or other patient management decisions. A negative result may occur with  improper specimen collection/handling, submission of specimen other than nasopharyngeal swab, presence of viral mutation(s) within the areas targeted by this assay, and inadequate number of viral copies(<138 copies/mL). A negative result must be combined with clinical  observations, patient history, and epidemiological information. The expected result is Negative.  Fact Sheet for Patients:  BloggerCourse.com  Fact Sheet for Healthcare Providers:  SeriousBroker.it  This test is no t yet approved or cleared by the [place] FDA and  has been authorized for detection and/or diagnosis of SARS-CoV-2 by FDA under an Emergency Use Authorization (EUA). This EUA will remain  in effect (meaning this test can be used) for the duration of the COVID-19 declaration under Section 564(b)(1) of the Act, 21 U.S.C.section 360bbb-3(b)(1), unless the authorization is terminated  or revoked sooner.       Influenza A by PCR NEGATIVE NEGATIVE Final   Influenza B by PCR NEGATIVE NEGATIVE Final    Comment: (NOTE) The Xpert Xpress SARS-CoV-2/FLU/RSV plus assay is intended as an aid in the diagnosis of influenza from Nasopharyngeal swab specimens and should not be used as a sole basis for treatment. Nasal washings and aspirates are unacceptable for Xpert Xpress SARS-CoV-2/FLU/RSV testing.  Fact Sheet for Patients: BloggerCourse.com  Fact Sheet for Healthcare Providers:  IncredibleEmployment.be  This test is not yet approved or cleared by the [place] and has been authorized for detection and/or diagnosis of SARS-CoV-2 by FDA under an Emergency Use Authorization (EUA). This EUA will remain in effect (meaning this test can be used) for the duration of the COVID-19 declaration under Section 564(b)(1) of the Act, 21 U.S.C. section 360bbb-3(b)(1), unless the authorization is terminated or revoked.  Performed at [facility], [address] [place], [place] [address]   MRSA Next Gen by PCR, Nasal     Status: Abnormal   Collection Time: 05/29/22  2:27 AM   Specimen: Nasal Mucosa; Nasal Swab  Result Value Ref Range Status   MRSA by PCR Next Gen DETECTED (A) NOT  DETECTED Final    Comment: RESULT CALLED TO, READ BACK BY AND VERIFIED WITH: MILLS M @ 1106 ON [id number] BY HENDERSON L (NOTE) The GeneXpert MRSA Assay (FDA approved for NASAL specimens only), is one component of a comprehensive MRSA colonization surveillance program. It is not intended to diagnose MRSA infection nor to guide or monitor treatment for MRSA infections. Test performance is not FDA approved in patients less than 80 years old. Performed at [facility], [address] [place], [place] [address]   Culture, Respiratory w Gram Stain     Status: None   Collection Time: 05/31/22  8:00 AM   Specimen: Tracheal Aspirate; Respiratory  Result Value Ref Range Status   Specimen Description   Final    TRACHEAL ASPIRATE Performed at [facility], [address], [place], [place] [address]    Special Requests   Final    NONE Performed at [facility], [address], [place], [place] [address]    Gram Stain NO WBC SEEN NO ORGANISMS SEEN   Final   Culture   Final    NO GROWTH Performed at [facility], [place] [address] [place], [place] [address]    Report Status 06/02/2022 FINAL  Final     Scheduled Meds:  arformoterol  15 mcg Nebulization BID   budesonide (PULMICORT) nebulizer solution  0.5 mg Nebulization BID   Chlorhexidine Gluconate Cloth  6 each Topical Q0600   clopidogrel  75 mg Per Tube Daily   feeding supplement (PROSource TF20)  60 mL Per Tube BID   free water  200 mL Per Tube Q4H   heparin  5,000 Units Subcutaneous Q8H   insulin aspart  0-9 Units Subcutaneous Q4H   methylPREDNISolone (SOLU-MEDROL) injection  40 mg Intravenous Q12H   metoprolol tartrate  7.5 mg Intravenous Q6H   mupirocin ointment   Nasal BID   mouth rinse  15 mL Mouth Rinse Q2H   pantoprazole (PROTONIX) IV  40 mg Intravenous Q12H   phosphorus  500 mg Per Tube BID   polyethylene glycol  17 g Per Tube Daily   revefenacin  175 mcg Nebulization Daily   Continuous Infusions:  azithromycin 500 mg  (06/03/22 1358)   aztreonam 200 mL/hr at 06/03/22 1302   dexmedetomidine (PRECEDEX) IV infusion 1.2 mcg/kg/hr (06/03/22 1355)   feeding supplement (VITAL AF 1.2 CAL) 30 mL/hr at 06/03/22 1302   fentaNYL infusion INTRAVENOUS 75 mcg/hr (06/03/22 1302)   vancomycin 750 mg (06/03/22 1507)    Procedures/Studies: DG Chest Port 1 View  Result Date: 06/03/2022 CLINICAL DATA:  Acute respiratory failure with hypoxia EXAM: PORTABLE CHEST 1 VIEW COMPARISON:  06/01/2022 FINDINGS: Endotracheal and enteric tubes are again identified. Enteric tube has likely retracted some given location of side  port. 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 Similar left greater than right interstitial changes. No significant pleural effusion. No pneumothorax. Stable cardiomediastinal contours. IMPRESSION: Similar left greater than right patchy opacities superimposed on emphysema. Enteric tube has likely retracted as side port is now visible just below the gastroesophageal junction. Electronically Signed   By: Macy Mis M.D.   On: 06/03/2022 08:19   DG CHEST PORT 1 VIEW  Result Date: 06/01/2022 CLINICAL DATA:  Pneumonia EXAM: PORTABLE CHEST 1 VIEW COMPARISON:  Previous studies including the examination of 05/31/2022 FINDINGS: Tip of endotracheal tube is 7 cm above the carina. Enteric tube is noted traversing the esophagus. Cardiac size is within normal limits. There is slight improvement in infiltrates in left lung. There is possible slight worsening of infiltrate in right parahilar region. Bullous emphysema is seen in right upper lung field limiting evaluation for small pneumothorax. There is no significant pleural effusion. No definite pneumothorax is seen. IMPRESSION: There is decrease in extensive interstitial infiltrates in left lung. There is slight worsening of infiltrate in right parahilar region. Severe bullous emphysema in the right upper lung field. Electronically Signed   By: Elmer Picker M.D.   On:  06/01/2022 09:17   ECHOCARDIOGRAM LIMITED  Result Date: 05/31/2022    ECHOCARDIOGRAM LIMITED REPORT   [name] Date of Exam: 05/31/2022 Medical Rec #:  [id number]         Height:       60.0 in Accession #:    [id number]        Weight:       90.2 lb Date of Birth:  [birth year]         BSA:          1.330 m Patient Age:    61 years          BP:           125/55 mmHg Patient Gender: F                 HR:           101 bpm. Exam Location:  [facility] Procedure: Limited Echo Indications:    CHF-Acute Systolic [id number]  History:        Patient has prior history of Echocardiogram examinations, most                 recent 05/29/2022. Previous Myocardial Infarction and CAD, COPD                 and Stroke; Risk Factors:Hypertension and Dyslipidemia. Hx of                 CKD, COVID-10.  Sonographer:    Alvino Chapel RCS Referring Phys: [phone] DAVID TAT  Sonographer Comments: *Patient on mechanical ventilator at present. IMPRESSIONS  1. Left ventricular ejection fraction, by estimation, is 35 to 40%. The left ventricle has moderately decreased function. The left ventricle demonstrates regional wall motion abnormalities (see scoring diagram/findings for description). The mid septal and all apical LV segments are akinetic (consistent with prior TTE).  2. Right ventricular systolic function is mildly reduced. The right ventricular size is normal.  3. The mitral valve is degenerative. Moderate mitral annular calcification. Comparison(s): Compared to prior TTE on 05/29/22, there is no significant change. FINDINGS  Left Ventricle: The mid septal and all apical LV segments are akinetic. Left ventricular ejection fraction, by estimation, is 35 to 40%. The left ventricle has moderately decreased  function. The left ventricle demonstrates regional wall motion abnormalities. The left ventricular internal cavity size was normal in size. There is no left ventricular hypertrophy. Right Ventricle: The right ventricular  size is normal. Right ventricular systolic function is mildly reduced. Pericardium: There is no evidence of pericardial effusion. Mitral Valve: The mitral valve is degenerative in appearance. There is mild thickening of the mitral valve leaflet(s). There is mild calcification of the mitral valve leaflet(s). Moderate mitral annular calcification. Aorta: The aortic root is normal in size and structure. LEFT VENTRICLE PLAX 2D LVIDd:         3.90 cm LVIDs:         3.30 cm LV PW:         0.90 cm LV IVS:        0.90 cm LVOT diam:     1.70 cm LVOT Area:     2.27 cm  LV Volumes (MOD) LV vol d, MOD A2C: 38.7 ml LV vol d, MOD A4C: 50.3 ml LV vol s, MOD A2C: 21.4 ml LV vol s, MOD A4C: 26.8 ml LV SV MOD A2C:     17.3 ml LV SV MOD A4C:     50.3 ml LV SV MOD BP:      20.7 ml LEFT ATRIUM         Index LA diam:    3.20 cm 2.41 cm/m   AORTA Ao Root diam: 2.50 cm  SHUNTS Systemic Diam: 1.70 cm Gwyndolyn Kaufman MD Electronically signed by Gwyndolyn Kaufman MD Signature Date/Time: 05/31/2022/1:31:58 PM    Final    DG Chest 1 View  Result Date: 05/31/2022 CLINICAL DATA:  Shortness of breath EXAM: CHEST  1 VIEW COMPARISON:  05/30/2022 FINDINGS: Endotracheal tube terminates 7 cm above the carina. Multifocal patchy left lung opacities, favoring pneumonia, possibly on the basis of aspiration. Right lung is essentially clear. No pleural effusion or pneumothorax. The heart is normal in size. Enteric tube courses into the stomach. Cholecystectomy clips. IMPRESSION: Endotracheal tube terminates 7 cm above the carina. Multifocal patchy left lung opacities, favoring pneumonia, possibly on the basis of aspiration. Electronically Signed   By: Julian Hy M.D.   On: 05/31/2022 03:54   DG CHEST PORT 1 VIEW  Result Date: 05/30/2022 CLINICAL DATA:  Endotracheal and orogastric tube. EXAM: PORTABLE CHEST 1 VIEW COMPARISON:  Chest CT 05/29/2022.  Chest x-ray 05/30/2009 3. FINDINGS: Endotracheal tube tip is 5 cm above the carina. Enteric  tube tip is in the mid stomach. There are increasing central interstitial and airspace opacities throughout the left lung. Severe emphysematous changes are again seen in the right upper lobe. There is no pleural effusion or pneumothorax. No acute fractures are seen. Cardiomediastinal silhouette is within normal limits. IMPRESSION: 1. Endotracheal tube tip 5 cm above carina. 2. Enteric tube tip at the level of the mid stomach. 3. Increasing left lung interstitial and airspace opacities which may represent edema or infection. 4. Stable severe emphysema. Electronically Signed   By: Ronney Asters M.D.   On: 05/30/2022 19:50   DG CHEST PORT 1 VIEW  Result Date: 05/30/2022 CLINICAL DATA:  Respiratory failure, pneumonia EXAM: PORTABLE CHEST 1 VIEW COMPARISON:  Previous studies including chest radiograph done on 05/28/2022 and CT done on 05/29/2022 FINDINGS: There is diffuse increase in patchy interstitial and alveolar densities in both lungs, more so on the left side. There is relative sparing of right upper lung field due to underlying bullous emphysema. There is blunting of both lateral CP angles.  There is no pneumothorax. IMPRESSION: There is diffuse increase in interstitial and alveolar markings in both lungs, more so on the left side. Findings suggest asymmetric pulmonary edema or worsening multifocal bilateral pneumonia. Small bilateral pleural effusions. Electronically Signed   By: Elmer Picker M.D.   On: 05/30/2022 10:05   ECHOCARDIOGRAM COMPLETE  Result Date: 05/29/2022    ECHOCARDIOGRAM REPORT   [name] Date of Exam: 05/29/2022 Medical Rec #:  [id number]         Height:       60.0 in Accession #:    [id number]        Weight:       88.2 lb Date of Birth:  [birth year]         BSA:          1.318 m Patient Age:    21 years          BP:           143/66 mmHg Patient Gender: F                 HR:           113 bpm. Exam Location:  [facility] Procedure: 2D Echo, Cardiac Doppler and  Color Doppler Indications:    Elevated Troponin  History:        Patient has prior history of Echocardiogram examinations, most                 recent 04/14/2018. CAD and Previous Myocardial Infarction, COPD                 and Stroke; Risk Factors:Hypertension and Dyslipidemia. Hx of                 CKD, COVID-10.  Sonographer:    Alvino Chapel RCS Referring Phys: [phone] DAVID TAT  Sonographer Comments: ** Patient on BiPAP and moving Alot during echo. IMPRESSIONS  1. Left ventricular ejection fraction, by estimation, is 35 to 40%. The left ventricle has moderately decreased function. The left ventricle demonstrates regional wall motion abnormalities (see scoring diagram/findings for description). Left ventricular  diastolic parameters are indeterminate.  2. Right ventricular systolic function is mildly reduced. The right ventricular size is normal. There is moderately elevated pulmonary artery systolic pressure. The estimated right ventricular systolic pressure is [id number] mmHg.  3. The mitral valve is degenerative. Trivial mitral valve regurgitation.  4. The aortic valve was not well visualized. Aortic valve regurgitation is not visualized. No aortic stenosis is present.  5. The inferior vena cava is dilated in size with <50% respiratory variability, suggesting right atrial pressure of 15 mmHg. Conclusion(s)/Recommendation(s): Normal systolic function at base with apical akinesis, differential includes LAD infarct vs stress induced cardiomyopathy. FINDINGS  Left Ventricle: Left ventricular ejection fraction, by estimation, is 35 to 40%. The left ventricle has moderately decreased function. The left ventricle demonstrates regional wall motion abnormalities. The left ventricular internal cavity size was normal in size. There is no left ventricular hypertrophy. Left ventricular diastolic parameters are indeterminate.  LV Wall Scoring: The mid and distal anterior septum, entire apex, and mid inferoseptal segment are  akinetic. The anterior wall, antero-lateral wall, inferior wall, posterior wall, basal anteroseptal segment, and basal inferoseptal segment are normal. Right Ventricle: The right ventricular size is normal. Right vetricular wall thickness was not well visualized. Right ventricular systolic function is mildly reduced. There is moderately elevated pulmonary artery systolic pressure. The tricuspid regurgitant velocity is  3.00 m/s, and with an assumed right atrial pressure of 15 mmHg, the estimated right ventricular systolic pressure is 51.0 mmHg. Left Atrium: Left atrial size was normal in size. Right Atrium: Right atrial size was normal in size. Pericardium: There is no evidence of pericardial effusion. Mitral Valve: The mitral valve is degenerative in appearance. Trivial mitral valve regurgitation. Tricuspid Valve: The tricuspid valve is normal in structure. Tricuspid valve regurgitation is trivial. Aortic Valve: The aortic valve was not well visualized. Aortic valve regurgitation is not visualized. No aortic stenosis is present. Pulmonic Valve: The pulmonic valve was not well visualized. Pulmonic valve regurgitation is not visualized. Aorta: The aortic root is normal in size and structure. Venous: The inferior vena cava is dilated in size with less than 50% respiratory variability, suggesting right atrial pressure of 15 mmHg. IAS/Shunts: The interatrial septum was not well visualized.  LEFT VENTRICLE PLAX 2D LVIDd:         3.60 cm LVIDs:         2.50 cm LV PW:         0.90 cm LV IVS:        0.90 cm LVOT diam:     1.70 cm LV SV:         37 LV SV Index:   28 LVOT Area:     2.27 cm  LV Volumes (MOD) LV vol d, MOD A2C: 51.2 ml LV vol d, MOD A4C: 57.0 ml LV vol s, MOD A2C: 30.1 ml LV vol s, MOD A4C: 32.9 ml LV SV MOD A2C:     21.1 ml LV SV MOD A4C:     57.0 ml LV SV MOD BP:      24.6 ml RIGHT VENTRICLE TAPSE (M-mode): 1.2 cm LEFT ATRIUM             Index        RIGHT ATRIUM           Index LA diam:        2.60 cm 1.97  cm/m   RA Area:     10.40 cm LA Vol (A2C):   32.4 ml 24.58 ml/m  RA Volume:   25.30 ml  19.20 ml/m LA Vol (A4C):   32.5 ml 24.66 ml/m LA Biplane Vol: 35.2 ml 26.71 ml/m  AORTIC VALVE LVOT Vmax:   88.30 cm/s LVOT Vmean:  55.300 cm/s LVOT VTI:    0.164 m  AORTA Ao Root diam: 2.50 cm MITRAL VALVE                TRICUSPID VALVE MV Area (PHT): 7.16 cm     TR Peak grad:   36.0 mmHg MV Decel Time: 106 msec     TR Vmax:        300.00 cm/s MV E velocity: 136.00 cm/s                             SHUNTS                             Systemic VTI:  0.16 m                             Systemic Diam: 1.70 cm Epifanio Lesches MD Electronically signed by Epifanio Lesches MD Signature Date/Time: 05/29/2022/2:52:39 PM    Final    CT Angio Chest  Pulmonary Embolism (PE) W or WO Contrast  Result Date: 05/29/2022 CLINICAL DATA:  Shortness of breath x2 weeks EXAM: CT ANGIOGRAPHY CHEST WITH CONTRAST TECHNIQUE: Multidetector CT imaging of the chest was performed using the standard protocol during bolus administration of intravenous contrast. Multiplanar CT image reconstructions and MIPs were obtained to evaluate the vascular anatomy. RADIATION DOSE REDUCTION: This exam was performed according to the departmental dose-optimization program which includes automated exposure control, adjustment of the mA and/or kV according to patient size and/or use of iterative reconstruction technique. CONTRAST:  90mL OMNIPAQUE IOHEXOL 350 MG/ML SOLN COMPARISON:  Previous studies including the chest radiograph done on 05/28/2022 FINDINGS: Cardiovascular: There is homogeneous enhancement in thoracic aorta. There are no intraluminal filling defects in central pulmonary artery branches. There are scattered coronary artery calcifications. Mediastinum/Nodes: No significant lymphadenopathy seen. There are small pockets of air in right subclavian vein, possibly introduced during venipuncture. Lungs/Pleura: Centrilobular and panlobular emphysema is seen.  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 There is small patchy infiltrate in the lateral aspect of posterior segment of right upper lobe. There is thickening of interlobular septi in the lower lung fields. There are patchy infiltrates in right middle lobe and both lower lobes. Small right pleural effusion is seen. There is minimal left pleural effusion. There is no pneumothorax. Upper Abdomen: There is 5.1 cm smooth marginated fluid density lesion in the upper pole of left kidney suggesting possible cyst. No follow-up is recommended. There are a few low-density lesions in liver measuring up to 11 mm in size which are not fully characterized, most likely cysts. No follow-up imaging is recommended. Musculoskeletal: No acute findings are seen. Review of the MIP images confirms the above findings. IMPRESSION: There is no evidence of pulmonary artery embolism. There is no evidence of thoracic aortic dissection. Scattered coronary artery calcifications are seen. 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 There are small patchy infiltrates in posterior segment of right upper lobe, right middle lobe and both lower lobes suggesting multifocal atelectasis/pneumonia. Bilateral pleural effusions, more so on the right side. There are low-density lesions in left kidney and liver, possibly cysts. No follow-up imaging is recommended. Electronically Signed   By: Elmer Picker M.D.   On: 05/29/2022 13:04   DG Chest Portable 1 View  Result Date: 05/28/2022 CLINICAL DATA:  Shortness of breath for 2 weeks.  COVID positive. EXAM: PORTABLE CHEST 1 VIEW COMPARISON:  05/02/2022 FINDINGS: Lungs are hyperinflated. Significant bullous changes at the RIGHT lung apex, similar to prior. There are no focal consolidations or pleural effusions. No pulmonary edema. IMPRESSION: Hyperinflation of the lungs. No evidence for acute pulmonary abnormality. Electronically Signed   By: Nolon Nations M.D.   On:  05/28/2022 17:12    Barton Dubois, MD  [facility]  If 7PM-7AM, please contact night-coverage [email or web address] Password [email or web address] 06/03/2022, 5:07 PM   LOS: 6 days

## 2022-06-03 NOTE — Progress Notes (Signed)
Progress Note  Patient Name: [name] Date of Encounter: 06/03/2022  Primary Cardiologist: Little Ishikawa, MD  Subjective   Patient failed weaning attempt this morning.  Resedated and continues on ventilatory support, now more comfortable.  Inpatient Medications    Scheduled Meds:  arformoterol  15 mcg Nebulization BID   budesonide (PULMICORT) nebulizer solution  0.5 mg Nebulization BID   Chlorhexidine Gluconate Cloth  6 each Topical Q0600   clopidogrel  75 mg Per Tube Daily   feeding supplement (PROSource TF20)  60 mL Per Tube BID   free water  200 mL Per Tube Q4H   heparin  5,000 Units Subcutaneous Q8H   insulin aspart  0-9 Units Subcutaneous Q4H   methylPREDNISolone (SOLU-MEDROL) injection  40 mg Intravenous Q12H   metoprolol tartrate  7.5 mg Intravenous Q6H   mupirocin ointment   Nasal BID   mouth rinse  15 mL Mouth Rinse Q2H   pantoprazole (PROTONIX) IV  40 mg Intravenous Q12H   phosphorus  500 mg Per Tube BID   polyethylene glycol  17 g Per Tube Daily   revefenacin  175 mcg Nebulization Daily   Continuous Infusions:  azithromycin Stopped (06/02/22 1318)   aztreonam Stopped (06/03/22 0540)   dexmedetomidine (PRECEDEX) IV infusion 1.2 mcg/kg/hr (06/03/22 0805)   feeding supplement (VITAL AF 1.2 CAL) 30 mL/hr at 06/03/22 0805   fentaNYL infusion INTRAVENOUS 50 mcg/hr (06/03/22 0755)   vancomycin Stopped (06/02/22 1533)   PRN Meds: acetaminophen **OR** acetaminophen, albuterol, fentaNYL, fentaNYL (SUBLIMAZE) injection, fentaNYL (SUBLIMAZE) injection, hydrALAZINE, midazolam, ondansetron **OR** ondansetron (ZOFRAN) IV, mouth rinse, polyethylene glycol   Vital Signs    Vitals:   06/03/22 0713 06/03/22 0751 06/03/22 0800 06/03/22 0900  BP:   (!) 132/55 136/89  Pulse:   (!) 153 (!) 108  Resp:   (!) 24 15  Temp:   98.2 F (36.8 C) 98.4 F (36.9 C)  TempSrc:      SpO2: 100% 100% 99% 100%  Weight:      Height:        Intake/Output Summary (Last  24 hours) at 06/03/2022 1006 Last data filed at 06/03/2022 0805 Gross per 24 hour  Intake 1239.94 ml  Output 1750 ml  Net -510.06 ml   Filed Weights   06/01/22 0455 06/02/22 0600 06/03/22 0505  Weight: 41 kg 41.7 kg 45.7 kg    Telemetry    Currently sinus rhythm, tachycardic with intermittent SVT during weaning attempt.  Personally reviewed.  ECG    No ECG reviewed.  Physical Exam   GEN: No acute distress. ET tube in place and on ventilator. Neck: No JVD. Cardiac: RRR with no significant murmur or gallop noted.  Respiratory: Nonlabored.  Decreased breath sounds with scattered crackles and rhonchi. GI: Soft, nontender, bowel sounds present. MS: No edema. Neuro: Sedated.  Labs    Chemistry Recent Labs  Lab 05/28/22 1640 05/29/22 0438 06/01/22 0424 06/02/22 0553 06/03/22 0436  NA 137   < > 145 148* 146*  K 4.3   < > 4.6 4.5 3.6  CL 109   < > 119* 121* 118*  CO2 22   < > 22 24 26   GLUCOSE 120*   < > 192* 199* 257*  BUN 17   < > 45* 47* 44*  CREATININE 1.08*   < > 1.21* 1.02* 0.84  CALCIUM 8.6*   < > 8.3* 8.7* 8.7*  PROT 7.5  --   --   --   --  ALBUMIN 3.3*  --   --   --   --   AST 39  --   --   --   --   ALT 32  --   --   --   --   ALKPHOS 107  --   --   --   --   BILITOT 0.6  --   --   --   --   GFRNONAA 55*   < > 48* 58* >60  ANIONGAP 6   < > 4* 3* 2*   < > = values in this interval not displayed.     Hematology Recent Labs  Lab 06/01/22 0424 06/02/22 0553 06/03/22 0436  WBC 8.1 9.3 9.9  RBC 2.63* 2.72* 2.65*  HGB 9.2* 9.5* 9.1*  HCT 30.6* 32.1* 29.9*  MCV 116.3* 118.0* 112.8*  MCH 35.0* 34.9* 34.3*  MCHC 30.1 29.6* 30.4  RDW 16.3* 16.3* 16.1*  PLT 194 198 207    Cardiac Enzymes Recent Labs  Lab 05/28/22 1640 05/28/22 2259 05/29/22 0438 05/29/22 0811 05/29/22 1012  TROPONINIHS 13 375* 478* 400* 368*    BNP Recent Labs  Lab 05/28/22 1640 05/30/22 2002  BNP 72.0 1,779.0*     DDimer Recent Labs  Lab 05/28/22 1725  DDIMER  3.97*     Radiology    DG Chest Port 1 View  Result Date: 06/03/2022 CLINICAL DATA:  Acute respiratory failure with hypoxia EXAM: PORTABLE CHEST 1 VIEW COMPARISON:  06/01/2022 FINDINGS: Endotracheal and enteric tubes are again identified. Enteric tube has likely retracted some given location of side port. 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 Similar left greater than right interstitial changes. No significant pleural effusion. No pneumothorax. Stable cardiomediastinal contours. IMPRESSION: Similar left greater than right patchy opacities superimposed on emphysema. Enteric tube has likely retracted as side port is now visible just below the gastroesophageal junction. Electronically Signed   By: Guadlupe Spanish M.D.   On: 06/03/2022 08:19     Assessment & Plan    1.  HFrEF, newly documented with LVEF 35 to 40% and wall motion abnormalities suggesting possibility of stress-induced cardiomyopathy or LAD distribution infarct.  Complicating picture is recent COVID-19 with current hypoxic respiratory failure due to pneumonia and underlying severe COPD.  Currently on IV Lopressor.   2.  History of CAD with reported inferior infarct and PTCA with stent to the RCA in 1997, NSTEMI in 2004 with documentation of occluded RCA that was managed medically.  Current high-sensitivity troponin I levels 300-400 range, not necessarily consistent with ACS or in line with degree of LV dysfunction in terms of an acute event.  She completed 48-hour course of IV heparin.  Currently on Plavix per tube.   3.  Stable anemia, hemoglobin 9.1 overt bleeding.   4.  Acute hypoxic and hypercarbic respiratory failure with lobar pneumonia, MRSA positive, recent COVID-19 infection as well, and underlying severe COPD.  Remains on the ventilator and on broad-spectrum antibiotics.  Pulmonary/CCM is following.  Failed weaning attempt this morning.   5.  Possible CKD stage IIIa, creatinine down to 0.84.  Continue supportive  measures.  If she shows clinical improvement in terms of pulmonary status and comes off ventilator, further discussion can be had regarding a diagnostic cardiac catheterization to evaluate coronary anatomy.  Continue Plavix per tube and standing IV Lopressor.  Allergies to be considered in the process of her coronary evaluation include aspirin, IV contrast media, and lisinopril.  Signed, Nona Dell, MD  06/03/2022, 10:06 AM

## 2022-06-04 ENCOUNTER — Inpatient Hospital Stay: Payer: Self-pay

## 2022-06-04 DIAGNOSIS — I5021 Acute systolic (congestive) heart failure: Secondary | ICD-10-CM | POA: Diagnosis not present

## 2022-06-04 DIAGNOSIS — I10 Essential (primary) hypertension: Secondary | ICD-10-CM | POA: Diagnosis not present

## 2022-06-04 DIAGNOSIS — J9601 Acute respiratory failure with hypoxia: Secondary | ICD-10-CM | POA: Diagnosis not present

## 2022-06-04 DIAGNOSIS — E870 Hyperosmolality and hypernatremia: Secondary | ICD-10-CM | POA: Diagnosis not present

## 2022-06-04 DIAGNOSIS — I428 Other cardiomyopathies: Secondary | ICD-10-CM | POA: Diagnosis not present

## 2022-06-04 DIAGNOSIS — I25119 Atherosclerotic heart disease of native coronary artery with unspecified angina pectoris: Secondary | ICD-10-CM

## 2022-06-04 DIAGNOSIS — D649 Anemia, unspecified: Secondary | ICD-10-CM | POA: Diagnosis not present

## 2022-06-04 LAB — CBC
HCT: 32.8 % — ABNORMAL LOW (ref 36.0–46.0)
Hemoglobin: 10 g/dL — ABNORMAL LOW (ref 12.0–15.0)
MCH: 34.2 pg — ABNORMAL HIGH (ref 26.0–34.0)
MCHC: 30.5 g/dL (ref 30.0–36.0)
MCV: 112.3 fL — ABNORMAL HIGH (ref 80.0–100.0)
Platelets: 204 10*3/uL (ref 150–400)
RBC: 2.92 MIL/uL — ABNORMAL LOW (ref 3.87–5.11)
RDW: 16.3 % — ABNORMAL HIGH (ref 11.5–15.5)
WBC: 11 10*3/uL — ABNORMAL HIGH (ref 4.0–10.5)
nRBC: 0.7 % — ABNORMAL HIGH (ref 0.0–0.2)

## 2022-06-04 LAB — GLUCOSE, CAPILLARY
Glucose-Capillary: 169 mg/dL — ABNORMAL HIGH (ref 70–99)
Glucose-Capillary: 219 mg/dL — ABNORMAL HIGH (ref 70–99)
Glucose-Capillary: 217 mg/dL — ABNORMAL HIGH (ref 70–99)
Glucose-Capillary: 113 mg/dL — ABNORMAL HIGH (ref 70–99)
Glucose-Capillary: 114 mg/dL — ABNORMAL HIGH (ref 70–99)
Glucose-Capillary: 167 mg/dL — ABNORMAL HIGH (ref 70–99)

## 2022-06-04 MED ORDER — ORAL CARE MOUTH RINSE
15.0000 mL | Freq: Four times a day (QID) | OROMUCOSAL | Status: DC
  Administered 2022-06-04 – 2022-06-05 (×4): 15 mL via OROMUCOSAL

## 2022-06-04 MED ORDER — ROSUVASTATIN CALCIUM 20 MG PO TABS: 20.0000 mg | ORAL_TABLET | Freq: Every day | ORAL | Status: DC

## 2022-06-04 MED ORDER — FENTANYL CITRATE PF 50 MCG/ML IJ SOSY
25.0000 ug | PREFILLED_SYRINGE | INTRAMUSCULAR | Status: DC | PRN
  Administered 2022-06-09: 50 ug via INTRAVENOUS
  Filled 2022-06-04: qty 1

## 2022-06-04 MED ORDER — SODIUM CHLORIDE 0.9% FLUSH
10.0000 mL | INTRAVENOUS | Status: DC | PRN
  Administered 2022-06-16 (×2): 10 mL

## 2022-06-04 MED ORDER — SODIUM CHLORIDE 0.9% FLUSH
10.0000 mL | Freq: Two times a day (BID) | INTRAVENOUS | Status: DC
  Administered 2022-06-04 – 2022-06-06 (×3): 10 mL
  Administered 2022-06-06: 20 mL
  Administered 2022-06-08 – 2022-06-09 (×4): 10 mL
  Administered 2022-06-10: 20 mL
  Administered 2022-06-10 – 2022-06-15 (×11): 10 mL

## 2022-06-04 NOTE — Progress Notes (Signed)
2 PICC line RNs at bedside.

## 2022-06-04 NOTE — Progress Notes (Signed)
PROGRESS NOTE  [name] [id number] DOB: 26-Mar-1951 DOA: 05/28/2022 PCP: Carrolyn Meiers, MD  Brief History:  71 year old female with a history of coronary disease, hypertension, hyperlipidemia, functional constipation, B12 and folate deficiency, anxiety, and prior stroke presenting with shortness of breath and respiratory failure.  The patient was recently mid to the hospital from 05/02/2022 to 05/09/2022 when she had acute respiratory failure secondary to COVID-19.  During that hospitalization, the patient was also noted to have colitis for which she was treated with levofloxacin and metronidazole.  She completed a course of antibiotics prior to discharge.  She was discharged home with a prednisone taper. She states that she has had some shortness of breath since discharge from the hospital, but this has significantly worsened in the 24 hours prior to admission.  Upon arrival to the emergency department, the patient was noted to have respiratory distress patient was placed on BiPAP. Repeat ABG on BiPAP did not show significant improvement.  There was plans for intubation, but the patient was given Ativan with improvement clinically.  She remained on BiPAP overnight.  Notably, the patient states that she started having some burning chest discomfort on the morning of 05/28/2022.  She states that it somewhat improved while she was on BiPAP, but continues to have the chest discomfort.  She was taken off of BiPAP in the morning of 05/29/2022 and stated that she had some worsening of her burning chest discomfort.  She developed some increased shortness of breath resulting in placement back on BiPAP on the morning of 05/29/22.  Troponins were noted to be 375>> 478.  D-dimer was also noted to be elevated 3.97.  The patient was started on IV heparin. The patient was given Solu-Medrol 125 mg IV, morphine 1 mg IV and placed back on BiPAP.  Repeat EKG showed sinus tachycardia with T wave  inversion V2-V5.  Troponins continue to be cycled. cardiology was consulted to assist with management.   On the evening of 05/30/2022, the patient developed respiratory distress and hypoxia on BiPAP.  She remained agitated despite pharmacologic and nonpharmacologic interventions.  After discussion with the patient's family, decision was made to intubate the patient.  Repeat limited echocardiogram was ordered for 05/31/22.     Assessment and Plan: * Acute respiratory failure with hypoxia and hypercarbia (HCC) - Presented with respiratory distress with oxygen saturation 77% on room air.  Appears to be secondary to COPD exacerbation and pneumonia -Patient failed the use of BiPAP and in the requiring to be intubated and mechanically ventilated.  (05/30/2022) -CTA chest--neg PE, severe emphysema; multifocal patchy infiltrates RUL, RLL, LLL -Continue supportive care, current antibiotics and SBT's. -She has failed with plans for extubation today due to agitation and increase tachypnea.  COPD with acute exacerbation ([place]) -Although the patient denies a history of COPD and has not had formal PFTs--> she endorses smoking over 30 years, 1/2 pack/day. -Quit smoking 7 years ago -Continue Yupelri/Brovana -Continue IV Solu-Medrol -Continue Pulmicort and pulmonary toiletry. -No wheezing appreciated on exam.  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 -EKG--sinus rhythm, T wave depression V2-V5 -9/8 echo--EF 35-40%, apical HK, trivial MR/TR -9/10--repeat limited Echo-EF 35-40%, unchanged -9/11 started on Plavix after finalization of heparin drip. -Allergy planning the possibility for cardiac catheterization once extubated if she remains stable.  Lobar pneumonia ([place]) -CTA chest as discussed above -MRSA+ -Continue vanc/aztreonam/azithro -PCT 0.64 -  Tracheal aspiration without any  growth. -Continue supportive care.  Hypernatremia - Continue to maintain adequate amount of free water through tube feedings -Follow electrolytes.  Impaired glucose tolerance -05/07/22 A1C--5.4 -Continue sliding scale insulin -Elevated CBGs in the setting of steroids usage and tube feedings.  Hypophosphatemia - Continue to follow ultralights and replete as needed.  Hyperkalemia -Given lokelma -Electrolytes stable currently -Continue telemetry monitoring and follow electrolytes trend.  Cardiomyopathy ([place]) -9/8 echo--EF 35-40%, apical HK, trivial MR/TR -Discussed with cardiology>>suspect Takatsubo's  -finish 48 hours IV heparin 9/11>> patient has been started on Plavix.   Repeat limited Echo EF 35-40%, unchanged  Malnutrition of moderate degree -Continue enteral feeding>>start vital 1.2 @ 30cc/hr increase free water 200 cc q 4 -Planning to provide feeding supplements once extubated.  Anemia -No signs of active blood loss -Hgb stable since admission -baseslin Hgb 10-11 -B12--674 -Folate 35.6 -Iron sat 37, ferritin 7392 -Continue to follow hemoglobin trend.  Chronic kidney disease, stage 3a ([place]) -Baseline creatinine 0.8-1.1 -Continue to follow renal function trend/stability.  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 -Continue risk factor modification.  Essential hypertension - Continue metoprolol -Given IV currently.  Family Communication: No family at bedside for updates.   Consultants:  pulm, cardiology   Code Status:  FULL    DVT Prophylaxis:  [place] Heparin      Procedures: As Listed in Progress Note Above   Antibiotics: Vanc 9/8>> Aztreonam 9/8>> Azithro 9/8>>     Subjective: Afebrile, no chest pain, no nausea or vomiting reported.  Extubated and receiving supplementation through TEVAR with an FiO2 of 40% currently.  Increased upper airway secretions  appreciated on exam. Patient is Confused and not following commands.  Objective: Vitals:   06/04/22 1413 06/04/22 1414 06/04/22 1415 06/04/22 1646  BP:      Pulse: (!) 136 (!) 139 (!) 139   Resp: 17 19 (!) 28   Temp:    99.2 F (37.3 C)  TempSrc:    Oral  SpO2: 96% 94% 100%   Weight:      Height:        Intake/Output Summary (Last 24 hours) at 06/04/2022 1654 Last data filed at 06/04/2022 1410 Gross per 24 hour  Intake 1247.36 ml  Output 100 ml  Net 1147.36 ml   Weight change: -1.9 kg Exam: General exam: Not following commands currently; patient has been extubated and currently receiving oxygen supplementation through T-bar (FiO2 40%). Respiratory system: Increased upper airway secretions appreciated on exam; no wheezing or crackles. Cardiovascular system:Rate controlled; no rubs, no gallops, no JVD. Gastrointestinal system: Abdomen is nondistended, soft and nontender. No organomegaly or masses felt. Normal bowel sounds heard. Central nervous system:: No following commands.  Able to move 4 limbs spontaneously. Extremities: No cyanosis or clubbing. Skin: No petechiae. Psychiatry: Judgement and insight appear impaired currently; hopefully in the setting of recovering from sedation.    Data Reviewed: I have personally reviewed following labs and imaging studies  Basic Metabolic Panel: Recent Labs  Lab 05/31/22 0419 05/31/22 0645 06/01/22 0424 06/02/22 0553 06/03/22 0436  NA 145 145 145 148* 146*  K 5.6* 5.2* 4.6 4.5 3.6  CL 117* 117* 119* 121* 118*  CO2 22 21* 22 24 26   GLUCOSE 137* 142* 192* 199* 257*  BUN 29* 31* 45* 47* 44*  CREATININE 1.34* 1.40* 1.21* 1.02* 0.84  CALCIUM 8.6* 8.4* 8.3* 8.7* 8.7*  MG 1.8  --  1.9 2.0  1.9  PHOS  --   --  2.2*  --  2.9   Liver Function Tests: No results for input(s): "AST", "ALT", "ALKPHOS", "BILITOT", "PROT", "ALBUMIN" in the last 168 hours.  CBC: Recent Labs  Lab 05/31/22 0419 06/01/22 0424 06/02/22 0553 06/03/22 0436  06/04/22 0519  WBC 8.3 8.1 9.3 9.9 11.0*  HGB 9.6* 9.2* 9.5* 9.1* 10.0*  HCT 32.9* 30.6* 32.1* 29.9* 32.8*  MCV 117.9* 116.3* 118.0* 112.8* 112.3*  PLT 251 194 198 207 204   CBG: Recent Labs  Lab 06/04/22 0101 06/04/22 0529 06/04/22 0737 06/04/22 1132 06/04/22 1645  GLUCAP 169* 219* 217* 113* 114*   HbA1C: No results for input(s): "HGBA1C" in the last 72 hours.  Urine analysis:    Component Value Date/Time   COLORURINE YELLOW 06/02/2022 1637   APPEARANCEUR CLEAR 06/02/2022 1637   LABSPEC 1.016 06/02/2022 1637   PHURINE 5.0 06/02/2022 1637   GLUCOSEU NEGATIVE 06/02/2022 1637   HGBUR NEGATIVE 06/02/2022 1637   BILIRUBINUR NEGATIVE 06/02/2022 1637   KETONESUR NEGATIVE 06/02/2022 1637   PROTEINUR NEGATIVE 06/02/2022 1637   UROBILINOGEN 1.0 07/16/2014 0244   NITRITE NEGATIVE 06/02/2022 1637   LEUKOCYTESUR NEGATIVE 06/02/2022 1637   Sepsis Labs:  Recent Results (from the past 240 hour(s))  Resp Panel by RT-PCR (Flu A&B, Covid) Anterior Nasal Swab     Status: None   Collection Time: 05/28/22  4:27 PM   Specimen: Anterior Nasal Swab  Result Value Ref Range Status   SARS Coronavirus 2 by RT PCR NEGATIVE NEGATIVE Final    Comment: (NOTE) SARS-CoV-2 target nucleic acids are NOT DETECTED.  The SARS-CoV-2 RNA is generally detectable in upper respiratory specimens during the acute phase of infection. The lowest concentration of SARS-CoV-2 viral copies this assay can detect is 138 copies/mL. A negative result does not preclude SARS-Cov-2 infection and should not be used as the sole basis for treatment or other patient management decisions. A negative result may occur with  improper specimen collection/handling, submission of specimen other than nasopharyngeal swab, presence of viral mutation(s) within the areas targeted by this assay, and inadequate number of viral copies(<138 copies/mL). A negative result must be combined with clinical observations, patient history, and  epidemiological information. The expected result is Negative.  Fact Sheet for Patients:  EntrepreneurPulse.com.au  Fact Sheet for Healthcare Providers:  IncredibleEmployment.be  This test is no t yet approved or cleared by the [place] FDA and  has been authorized for detection and/or diagnosis of SARS-CoV-2 by FDA under an Emergency Use Authorization (EUA). This EUA will remain  in effect (meaning this test can be used) for the duration of the COVID-19 declaration under Section 564(b)(1) of the Act, 21 U.S.C.section 360bbb-3(b)(1), unless the authorization is terminated  or revoked sooner.       Influenza A by PCR NEGATIVE NEGATIVE Final   Influenza B by PCR NEGATIVE NEGATIVE Final    Comment: (NOTE) The Xpert Xpress SARS-CoV-2/FLU/RSV plus assay is intended as an aid in the diagnosis of influenza from Nasopharyngeal swab specimens and should not be used as a sole basis for treatment. Nasal washings and aspirates are unacceptable for Xpert Xpress SARS-CoV-2/FLU/RSV testing.  Fact Sheet for Patients: EntrepreneurPulse.com.au  Fact Sheet for Healthcare Providers: IncredibleEmployment.be  This test is not yet approved or cleared by the [place] FDA and has been authorized for detection and/or diagnosis of SARS-CoV-2 by FDA under an Emergency Use Authorization (EUA). This EUA will remain in effect (meaning this test can be used) for  the duration of the COVID-19 declaration under Section 564(b)(1) of the Act, 21 U.S.C. section 360bbb-3(b)(1), unless the authorization is terminated or revoked.  Performed at [facility], [address], [place], [place] [address]   MRSA Next Gen by PCR, Nasal     Status: Abnormal   Collection Time: 05/29/22  2:27 AM   Specimen: Nasal Mucosa; Nasal Swab  Result Value Ref Range Status   MRSA by PCR Next Gen DETECTED (A) NOT DETECTED Final    Comment: RESULT CALLED  TO, READ BACK BY AND VERIFIED WITH: MILLS M @ 1106 ON [id number] BY HENDERSON L (NOTE) The GeneXpert MRSA Assay (FDA approved for NASAL specimens only), is one component of a comprehensive MRSA colonization surveillance program. It is not intended to diagnose MRSA infection nor to guide or monitor treatment for MRSA infections. Test performance is not FDA approved in patients less than 51 years old. Performed at [facility], [address], [place], [place] [address]   Culture, Respiratory w Gram Stain     Status: None   Collection Time: 05/31/22  8:00 AM   Specimen: Tracheal Aspirate; Respiratory  Result Value Ref Range Status   Specimen Description   Final    TRACHEAL ASPIRATE Performed at [facility], [address] [place], [place] [address]    Special Requests   Final    NONE Performed at [facility], [address] [place], [place] [address]    Gram Stain NO WBC SEEN NO ORGANISMS SEEN   Final   Culture   Final    NO GROWTH Performed at [facility], [place] [address], [place], [place] [address]    Report Status 06/02/2022 FINAL  Final  Urine Culture     Status: None   Collection Time: 06/02/22  4:37 PM   Specimen: Urine, Catheterized  Result Value Ref Range Status   Specimen Description   Final    URINE, CATHETERIZED Performed at [facility], [address], [place], [place] [address]    Special Requests   Final    NONE Performed at [facility], [address], [place], [place] [address]    Culture   Final    NO GROWTH Performed at [facility], [place] [address] [place],  [address]    Report Status 06/03/2022 FINAL  Final     Scheduled Meds:  arformoterol  15 mcg Nebulization BID   budesonide (PULMICORT) nebulizer solution  0.5 mg Nebulization BID   Chlorhexidine Gluconate Cloth  6 each Topical Q0600   clopidogrel  75 mg Per Tube Daily   heparin  5,000 Units Subcutaneous Q8H   insulin aspart  0-9 Units Subcutaneous Q4H   methylPREDNISolone  (SOLU-MEDROL) injection  40 mg Intravenous Q12H   metoprolol tartrate  7.5 mg Intravenous Q6H   mupirocin ointment   Nasal BID   mouth rinse  15 mL Mouth Rinse QID   pantoprazole (PROTONIX) IV  40 mg Intravenous Q12H   phosphorus  500 mg Per Tube BID   polyethylene glycol  17 g Per Tube Daily   revefenacin  175 mcg Nebulization Daily   Continuous Infusions:  azithromycin 500 mg (06/04/22 1410)   aztreonam 1 g (06/04/22 1308)   dexmedetomidine (PRECEDEX) IV infusion 0.5 mcg/kg/hr (06/04/22 1526)   vancomycin 750 mg (06/04/22 1407)    Procedures/Studies: [place] EKG SITE RITE  Result Date: 06/04/2022 If Site Rite image not attached, placement could not be confirmed due to current cardiac rhythm.  [place]  EKG SITE RITE  Result Date: 06/04/2022 If Site Rite image not attached, placement could not be confirmed due to current cardiac rhythm.  DG CHEST PORT 1 VIEW  Result Date: 06/03/2022 CLINICAL DATA:  NG tube placement.  Respiratory failure with hypoxia EXAM: PORTABLE CHEST 1 VIEW COMPARISON:  06/03/2022 FINDINGS: Endotracheal tube is 5.8 cm above the carina. NG tube enters the stomach. Heart is normal size. Increased markings in the lung bases likely reflects scarring. No acute confluent opacities or effusions. Emphysematous changes. No acute bony abnormality. IMPRESSION: COPD. Bibasilar scarring. Support devices as above. Electronically Signed   By: Rolm Baptise M.D.   On: 06/03/2022 22:37   DG Chest Port 1 View  Result Date: 06/03/2022 CLINICAL DATA:  Acute respiratory failure with hypoxia EXAM: PORTABLE CHEST 1 VIEW COMPARISON:  06/01/2022 FINDINGS: Endotracheal and enteric tubes are again identified. Enteric tube has likely retracted some given location of side port. 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 Similar left greater than right interstitial changes. No significant pleural effusion. No pneumothorax. Stable cardiomediastinal contours. IMPRESSION: Similar left greater than right  patchy opacities superimposed on emphysema. Enteric tube has likely retracted as side port is now visible just below the gastroesophageal junction. Electronically Signed   By: Macy Mis M.D.   On: 06/03/2022 08:19   DG CHEST PORT 1 VIEW  Result Date: 06/01/2022 CLINICAL DATA:  Pneumonia EXAM: PORTABLE CHEST 1 VIEW COMPARISON:  Previous studies including the examination of 05/31/2022 FINDINGS: Tip of endotracheal tube is 7 cm above the carina. Enteric tube is noted traversing the esophagus. Cardiac size is within normal limits. There is slight improvement in infiltrates in left lung. There is possible slight worsening of infiltrate in right parahilar region. Bullous emphysema is seen in right upper lung field limiting evaluation for small pneumothorax. There is no significant pleural effusion. No definite pneumothorax is seen. IMPRESSION: There is decrease in extensive interstitial infiltrates in left lung. There is slight worsening of infiltrate in right parahilar region. Severe bullous emphysema in the right upper lung field. Electronically Signed   By: Elmer Picker M.D.   On: 06/01/2022 09:17   ECHOCARDIOGRAM LIMITED  Result Date: 05/31/2022    ECHOCARDIOGRAM LIMITED REPORT   [name] Date of Exam: 05/31/2022 Medical Rec #:  [id number]         Height:       60.0 in Accession #:    [id number]        Weight:       90.2 lb Date of Birth:  06-Apr-1951         BSA:          1.330 m Patient Age:    24 years          BP:           125/55 mmHg Patient Gender: F                 HR:           101 bpm. Exam Location:  [facility] Procedure: Limited Echo Indications:    CHF-Acute Systolic [id number]  History:        Patient has prior history of Echocardiogram examinations, most                 recent 05/29/2022. Previous Myocardial Infarction and CAD, COPD                 and Stroke; Risk Factors:Hypertension and  Dyslipidemia. Hx of                 CKD, COVID-10.  Sonographer:    Celesta Gentile RCS Referring Phys: [phone] DAVID TAT  Sonographer Comments: *Patient on mechanical ventilator at present. IMPRESSIONS  1. Left ventricular ejection fraction, by estimation, is 35 to 40%. The left ventricle has moderately decreased function. The left ventricle demonstrates regional wall motion abnormalities (see scoring diagram/findings for description). The mid septal and all apical LV segments are akinetic (consistent with prior TTE).  2. Right ventricular systolic function is mildly reduced. The right ventricular size is normal.  3. The mitral valve is degenerative. Moderate mitral annular calcification. Comparison(s): Compared to prior TTE on 05/29/22, there is no significant change. FINDINGS  Left Ventricle: The mid septal and all apical LV segments are akinetic. Left ventricular ejection fraction, by estimation, is 35 to 40%. The left ventricle has moderately decreased function. The left ventricle demonstrates regional wall motion abnormalities. The left ventricular internal cavity size was normal in size. There is no left ventricular hypertrophy. Right Ventricle: The right ventricular size is normal. Right ventricular systolic function is mildly reduced. Pericardium: There is no evidence of pericardial effusion. Mitral Valve: The mitral valve is degenerative in appearance. There is mild thickening of the mitral valve leaflet(s). There is mild calcification of the mitral valve leaflet(s). Moderate mitral annular calcification. Aorta: The aortic root is normal in size and structure. LEFT VENTRICLE PLAX 2D LVIDd:         3.90 cm LVIDs:         3.30 cm LV PW:         0.90 cm LV IVS:        0.90 cm LVOT diam:     1.70 cm LVOT Area:     2.27 cm  LV Volumes (MOD) LV vol d, MOD A2C: 38.7 ml LV vol d, MOD A4C: 50.3 ml LV vol s, MOD A2C: 21.4 ml LV vol s, MOD A4C: 26.8 ml LV SV MOD A2C:     17.3 ml LV SV MOD A4C:     50.3 ml LV SV MOD BP:      20.7 ml LEFT ATRIUM         Index LA diam:    3.20 cm 2.41 cm/m   AORTA  Ao Root diam: 2.50 cm  SHUNTS Systemic Diam: 1.70 cm Laurance Flatten MD Electronically signed by Laurance Flatten MD Signature Date/Time: 05/31/2022/1:31:58 PM    Final    DG Chest 1 View  Result Date: 05/31/2022 CLINICAL DATA:  Shortness of breath EXAM: CHEST  1 VIEW COMPARISON:  05/30/2022 FINDINGS: Endotracheal tube terminates 7 cm above the carina. Multifocal patchy left lung opacities, favoring pneumonia, possibly on the basis of aspiration. Right lung is essentially clear. No pleural effusion or pneumothorax. The heart is normal in size. Enteric tube courses into the stomach. Cholecystectomy clips. IMPRESSION: Endotracheal tube terminates 7 cm above the carina. Multifocal patchy left lung opacities, favoring pneumonia, possibly on the basis of aspiration. Electronically Signed   By: Charline Bills M.D.   On: 05/31/2022 03:54   DG CHEST PORT 1 VIEW  Result Date: 05/30/2022 CLINICAL DATA:  Endotracheal and orogastric tube. EXAM: PORTABLE CHEST 1 VIEW COMPARISON:  Chest CT 05/29/2022.  Chest x-ray 05/30/2009 3. FINDINGS: Endotracheal tube tip is 5 cm above the carina. Enteric tube tip is in the mid stomach. There are increasing central interstitial and airspace opacities throughout the left lung. Severe emphysematous  changes are again seen in the right upper lobe. There is no pleural effusion or pneumothorax. No acute fractures are seen. Cardiomediastinal silhouette is within normal limits. IMPRESSION: 1. Endotracheal tube tip 5 cm above carina. 2. Enteric tube tip at the level of the mid stomach. 3. Increasing left lung interstitial and airspace opacities which may represent edema or infection. 4. Stable severe emphysema. Electronically Signed   By: Ronney Asters M.D.   On: 05/30/2022 19:50   DG CHEST PORT 1 VIEW  Result Date: 05/30/2022 CLINICAL DATA:  Respiratory failure, pneumonia EXAM: PORTABLE CHEST 1 VIEW COMPARISON:  Previous studies including chest radiograph done on 05/28/2022 and CT done  on 05/29/2022 FINDINGS: There is diffuse increase in patchy interstitial and alveolar densities in both lungs, more so on the left side. There is relative sparing of right upper lung field due to underlying bullous emphysema. There is blunting of both lateral CP angles. There is no pneumothorax. IMPRESSION: There is diffuse increase in interstitial and alveolar markings in both lungs, more so on the left side. Findings suggest asymmetric pulmonary edema or worsening multifocal bilateral pneumonia. Small bilateral pleural effusions. Electronically Signed   By: Elmer Picker M.D.   On: 05/30/2022 10:05   ECHOCARDIOGRAM COMPLETE  Result Date: 05/29/2022    ECHOCARDIOGRAM REPORT   Patient Name:   CATRIN HEADDEN Date of Exam: 05/29/2022 Medical Rec #:  [id number]         Height:       60.0 in Accession #:    [id number]        Weight:       88.2 lb Date of Birth:  1951/04/20         BSA:          1.318 m Patient Age:    46 years          BP:           143/66 mmHg Patient Gender: F                 HR:           113 bpm. Exam Location:  [facility] Procedure: 2D Echo, Cardiac Doppler and Color Doppler Indications:    Elevated Troponin  History:        Patient has prior history of Echocardiogram examinations, most                 recent 04/14/2018. CAD and Previous Myocardial Infarction, COPD                 and Stroke; Risk Factors:Hypertension and Dyslipidemia. Hx of                 CKD, COVID-10.  Sonographer:    Alvino Chapel RCS Referring Phys: [id number] DAVID TAT  Sonographer Comments: ** Patient on BiPAP and moving Alot during echo. IMPRESSIONS  1. Left ventricular ejection fraction, by estimation, is 35 to 40%. The left ventricle has moderately decreased function. The left ventricle demonstrates regional wall motion abnormalities (see scoring diagram/findings for description). Left ventricular  diastolic parameters are indeterminate.  2. Right ventricular systolic function is mildly reduced. The right ventricular  size is normal. There is moderately elevated pulmonary artery systolic pressure. The estimated right ventricular systolic pressure is [id number] mmHg.  3. The mitral valve is degenerative. Trivial mitral valve regurgitation.  4. The aortic valve was not well visualized. Aortic valve regurgitation is not visualized. No aortic stenosis is present.  5. The  inferior vena cava is dilated in size with <50% respiratory variability, suggesting right atrial pressure of 15 mmHg. Conclusion(s)/Recommendation(s): Normal systolic function at base with apical akinesis, differential includes LAD infarct vs stress induced cardiomyopathy. FINDINGS  Left Ventricle: Left ventricular ejection fraction, by estimation, is 35 to 40%. The left ventricle has moderately decreased function. The left ventricle demonstrates regional wall motion abnormalities. The left ventricular internal cavity size was normal in size. There is no left ventricular hypertrophy. Left ventricular diastolic parameters are indeterminate.  LV Wall Scoring: The mid and distal anterior septum, entire apex, and mid inferoseptal segment are akinetic. The anterior wall, antero-lateral wall, inferior wall, posterior wall, basal anteroseptal segment, and basal inferoseptal segment are normal. Right Ventricle: The right ventricular size is normal. Right vetricular wall thickness was not well visualized. Right ventricular systolic function is mildly reduced. There is moderately elevated pulmonary artery systolic pressure. The tricuspid regurgitant velocity is 3.00 m/s, and with an assumed right atrial pressure of 15 mmHg, the estimated right ventricular systolic pressure is [id number] mmHg. Left Atrium: Left atrial size was normal in size. Right Atrium: Right atrial size was normal in size. Pericardium: There is no evidence of pericardial effusion. Mitral Valve: The mitral valve is degenerative in appearance. Trivial mitral valve regurgitation. Tricuspid Valve: The tricuspid valve is  normal in structure. Tricuspid valve regurgitation is trivial. Aortic Valve: The aortic valve was not well visualized. Aortic valve regurgitation is not visualized. No aortic stenosis is present. Pulmonic Valve: The pulmonic valve was not well visualized. Pulmonic valve regurgitation is not visualized. Aorta: The aortic root is normal in size and structure. Venous: The inferior vena cava is dilated in size with less than 50% respiratory variability, suggesting right atrial pressure of 15 mmHg. IAS/Shunts: The interatrial septum was not well visualized.  LEFT VENTRICLE PLAX 2D LVIDd:         3.60 cm LVIDs:         2.50 cm LV PW:         0.90 cm LV IVS:        0.90 cm LVOT diam:     1.70 cm LV SV:         37 LV SV Index:   28 LVOT Area:     2.27 cm  LV Volumes (MOD) LV vol d, MOD A2C: 51.2 ml LV vol d, MOD A4C: 57.0 ml LV vol s, MOD A2C: 30.1 ml LV vol s, MOD A4C: 32.9 ml LV SV MOD A2C:     21.1 ml LV SV MOD A4C:     57.0 ml LV SV MOD BP:      24.6 ml RIGHT VENTRICLE TAPSE (M-mode): 1.2 cm LEFT ATRIUM             Index        RIGHT ATRIUM           Index LA diam:        2.60 cm 1.97 cm/m   RA Area:     10.40 cm LA Vol (A2C):   32.4 ml 24.58 ml/m  RA Volume:   25.30 ml  19.20 ml/m LA Vol (A4C):   32.5 ml 24.66 ml/m LA Biplane Vol: 35.2 ml 26.71 ml/m  AORTIC VALVE LVOT Vmax:   88.30 cm/s LVOT Vmean:  55.300 cm/s LVOT VTI:    0.164 m  AORTA Ao Root diam: 2.50 cm MITRAL VALVE                TRICUSPID VALVE  MV Area (PHT): 7.16 cm     TR Peak grad:   36.0 mmHg MV Decel Time: 106 msec     TR Vmax:        300.00 cm/s MV E velocity: 136.00 cm/s                             SHUNTS                             Systemic VTI:  0.16 m                             Systemic Diam: 1.70 cm Oswaldo Milian MD Electronically signed by Oswaldo Milian MD Signature Date/Time: 05/29/2022/2:52:39 PM    Final    CT Angio Chest Pulmonary Embolism (PE) W or WO Contrast  Result Date: 05/29/2022 CLINICAL DATA:  Shortness of breath  x2 weeks EXAM: CT ANGIOGRAPHY CHEST WITH CONTRAST TECHNIQUE: Multidetector CT imaging of the chest was performed using the standard protocol during bolus administration of intravenous contrast. Multiplanar CT image reconstructions and MIPs were obtained to evaluate the vascular anatomy. RADIATION DOSE REDUCTION: This exam was performed according to the departmental dose-optimization program which includes automated exposure control, adjustment of the mA and/or kV according to patient size and/or use of iterative reconstruction technique. CONTRAST:  27mL OMNIPAQUE IOHEXOL 350 MG/ML SOLN COMPARISON:  Previous studies including the chest radiograph done on 05/28/2022 FINDINGS: Cardiovascular: There is homogeneous enhancement in thoracic aorta. There are no intraluminal filling defects in central pulmonary artery branches. There are scattered coronary artery calcifications. Mediastinum/Nodes: No significant lymphadenopathy seen. There are small pockets of air in right subclavian vein, possibly introduced during venipuncture. Lungs/Pleura: Centrilobular and panlobular emphysema is seen. 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 There is small patchy infiltrate in the lateral aspect of posterior segment of right upper lobe. There is thickening of interlobular septi in the lower lung fields. There are patchy infiltrates in right middle lobe and both lower lobes. Small right pleural effusion is seen. There is minimal left pleural effusion. There is no pneumothorax. Upper Abdomen: There is 5.1 cm smooth marginated fluid density lesion in the upper pole of left kidney suggesting possible cyst. No follow-up is recommended. There are a few low-density lesions in liver measuring up to 11 mm in size which are not fully characterized, most likely cysts. No follow-up imaging is recommended. Musculoskeletal: No acute findings are seen. Review of the MIP images confirms the above findings. IMPRESSION: There is no evidence of  pulmonary artery embolism. There is no evidence of thoracic aortic dissection. Scattered coronary artery calcifications are seen. 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 There are small patchy infiltrates in posterior segment of right upper lobe, right middle lobe and both lower lobes suggesting multifocal atelectasis/pneumonia. Bilateral pleural effusions, more so on the right side. There are low-density lesions in left kidney and liver, possibly cysts. No follow-up imaging is recommended. Electronically Signed   By: Elmer Picker M.D.   On: 05/29/2022 13:04   DG Chest Portable 1 View  Result Date: 05/28/2022 CLINICAL DATA:  Shortness of breath for 2 weeks.  COVID positive. EXAM: PORTABLE CHEST 1 VIEW COMPARISON:  05/02/2022 FINDINGS: Lungs are hyperinflated. Significant bullous changes at the RIGHT lung apex, similar to prior. There are no focal consolidations or pleural  effusions. No pulmonary edema. IMPRESSION: Hyperinflation of the lungs. No evidence for acute pulmonary abnormality. Electronically Signed   By: Norva Pavlov M.D.   On: 05/28/2022 17:12    Vassie Loll, MD  [facility]  If 7PM-7AM, please contact night-coverage [email or web address] Password [email or web address] 06/04/2022, 4:54 PM   LOS: 7 days

## 2022-06-04 NOTE — Progress Notes (Signed)
Nutrition Follow-up  DOCUMENTATION CODES:   Non-severe (moderate) malnutrition in context of chronic illness  Underweight  INTERVENTION:   When diet advanced, add Ensure Enlive po TID, each supplement provides 350 kcal and 20 grams of protein.  If unable to safely advance PO diet, recommend place NG tube for enteral nutrition supplementation: Osmolite 1.2 at 55 ml/h would provide 1584 kcal, 73 gm protein, 1082 ml free water daily to meet 100% of nutrition needs.   NUTRITION DIAGNOSIS:   Moderate Malnutrition related to chronic illness as evidenced by percent weight loss, mild fat depletion, mild muscle depletion.  Ongoing  GOAL:  Provide needs based on ASPEN/SCCM guidelines  Progressing   MONITOR:  Diet advancement, Labs, Weight trends, Supplement acceptance, PO intake  REASON FOR ASSESSMENT:   Consult Assessment of nutrition requirement/status  ASSESSMENT:  Patient is a 71 yo female with hx of CVA, blindness in right eye, GERD, CKD-3a, CAD, HLD, HTN. Recent COVID, AKI, colitis. Presents with shortness of breath and required BiPAP. COPD with acute exacerbation.  Patient was intubated 9/9, extubated this morning. She has been receiving Vital AF 1.2 at 30 ml/h via OG tube with Prosource TF 60 ml BID to provide 1024 kcal, 94 gm protein, 584 ml free water daily. Free water flushes 200 ml every 4 hours added 9/12. OG tube removed with extubation, so TF regimen is currently on hold.   Labs reviewed. Na 146 CBG: [phone]  Medications reviewed and include Novolog, Solu-medrol, Protonix, K-Phos, Miralax, Precedex.  Admission weight  40 kg Current weight 43.8 kg Net IO Since Admission: 3,474.17 mL [06/04/22 1320]  Diet Order:   Diet Order             Diet NPO time specified  Diet effective now                   EDUCATION NEEDS:  Not appropriate for education at this time  Skin:  Skin Assessment: Reviewed RN Assessment  Last BM:  9/8  Height:   Ht  Readings from Last 1 Encounters:  05/31/22 5' (1.524 m)    Weight:   Wt Readings from Last 1 Encounters:  06/04/22 43.8 kg    Ideal Body Weight:   45 kg  BMI:  Body mass index is 18.86 kg/m.  Estimated Nutritional Needs:   Kcal:  1500-1600  Protein:  70-85 gm  Fluid:  1200 ml daily   Gabriel Rainwater RD, LDN, CNSC Please refer to Amion for contact information.

## 2022-06-04 NOTE — Procedures (Signed)
Extubation Procedure Note  Patient Details:   Name: [name] DOB: Jul 26, 1951 MRN: [id number]   Airway Documentation:    Vent end date: 06/04/22 Vent end time: 0825   Evaluation  O2 sats: stable throughout Complications: No apparent complications Patient did tolerate procedure well. Bilateral Breath Sounds: Clear, Diminished   Yes patient able to state her full name.  Congested weak cough. Placed on 40% AFM.  Tolerated well. Will continue to monitor  [name] 06/04/2022, 8:29 AM

## 2022-06-04 NOTE — Progress Notes (Signed)
Peripherally Inserted Central Catheter Placement  The IV Nurse has discussed with the patient and/or persons authorized to consent for the patient, the purpose of this procedure and the potential benefits and risks involved with this procedure.  The benefits include less needle sticks, lab draws from the catheter, and the patient may be discharged home with the catheter. Risks include, but not limited to, infection, bleeding, blood clot (thrombus formation), and puncture of an artery; nerve damage and irregular heartbeat and possibility to perform a PICC exchange if needed/ordered by physician.  Alternatives to this procedure were also discussed.  Bard Power PICC patient education guide, fact sheet on infection prevention and patient information card has been provided to patient /or left at bedside.    PICC Placement Documentation  PICC Triple Lumen 06/04/22 Left Basilic 39 cm 0 cm (Active)  Indication for Insertion or Continuance of Line Administration of hyperosmolar/irritating solutions (i.e. TPN, Vancomycin, etc.) 06/04/22 1854  Exposed Catheter (cm) 0 cm 06/04/22 1854  Site Assessment Clean, Dry, Intact 06/04/22 1854  Lumen #1 Status Saline locked;Flushed;Blood return noted 06/04/22 1854  Lumen #2 Status Saline locked;Flushed;Blood return noted 06/04/22 1854  Lumen #3 Status Saline locked;Flushed;Blood return noted 06/04/22 1854  Dressing Type Transparent;Securing device 06/04/22 1854  Dressing Status Antimicrobial disc in place;Clean, Dry, Intact 06/04/22 1854  Safety Lock Not Applicable 06/04/22 1854  Line Care Connections checked and tightened 06/04/22 1854  Line Adjustment (NICU/IV Team Only) No 06/04/22 1854  Dressing Intervention New dressing 06/04/22 1854  Dressing Change Due 06/11/22 06/04/22 1854       [name] 06/04/2022, 6:55 PM

## 2022-06-04 NOTE — Progress Notes (Signed)
NAME:  [name], MRN:  [id number], DOB:  04-21-1951, LOS: 7 ADMISSION DATE:  05/28/2022, CONSULTATION DATE:  05/29/2022 REFERRING MD:  Dr. Arbutus Leas, [facility], CHIEF COMPLAINT:  Dyspnea   History of Present Illness:  71 yo female former smoker presented to [facility] ER with dyspnea.  SpO2 77% on room air.  Admitted  8/12 to 8/19 with COVID 19 infection with AKI and colitis, but didn't need supplemental oxygen at discharge.  Her breathing has gotten progressive worse since hospital discharge.  Her chest xray showed hyperinflation with bullous changes.  She was started on supplemental oxygen and Bipap, and started on therapy for possible COPD exacerbation.  PCCM asked to assist with respiratory management in ICU.  Pertinent  Medical History  Anxiety, Blindness Rt eye, CVA, GERD, HLD, HTN, Depression, CAD s/p stent, CKD 3a  Significant Hospital Events: Including procedures, antibiotic start and stop dates in addition to other pertinent events   9/07 Admit, start on Bipap 9/08 Cardiology consulted for elevated troponin, start heparin gtt 9/8 rx zmax/vanc/aztreonam>>>  ET 9/9 > out 9/14   Studies:  Echo 9/08 >-EF 35-40%, apical HK, trivial MR/TR c/w Takatsubo's  CT angio chest 9/08 >> centrilobular/paraseptal emphysema with bullous emphysema RUL, patchy infiltrate RUL/RML/RLL/LLL, small Rt effusion MRSA PCR screen 9/8  POS   Interim History / Subjective:  Agitated on t bar and max precedex but tol extubation ok   Objective   BP (!) 137/99   Pulse (!) 126   Temp 98.6 F (37 C)   Resp (!) 24   Ht 5' (1.524 m)   Wt 43.8 kg   SpO2 100%   BMI 18.86 kg/m   Vent Mode: PRVC FiO2 (%):  [35 %-40 %] 40 % Set Rate:  [22 bmp] 22 bmp Vt Set:  [370 mL] 370 mL PEEP:  [5 cmH20] 5 cmH20 Plateau Pressure:  [13 cmH20-16 cmH20] 14 cmH20   Intake/Output Summary (Last 24 hours) at 06/04/2022 0947 Last data filed at 06/03/2022 2357 Gross per 24 hour  Intake 1738.16 ml  Output 1125 ml  Net 613.16 ml    Filed Weights   06/02/22 0600 06/03/22 0505 06/04/22 0500  Weight: 41.7 kg 45.7 kg 43.8 kg    Examination: Tmax:  99.7 General appearance:    eldelry bf/ very restless on precedex rx   At Rest 02 sats  100% on 0.40 VM  No jvd Oropharynx clear/ good cough  Neck supple Lungs with a fvery distant scattered exp > insp rhonchi bilaterally RRR no s3 or or sign murmur Abd soft/ benign with limted  excursion  Extr warm with no edema or clubbing noted Neuro  Sensorium intact ,  no apparent motor deficits    I personally reviewed images and agree with radiology impression as follows:  CXR:   portable 9/13 COPD.Bibasilar scarring.  Resolved Hospital Problem list     Assessment & Plan:   Acute hypoxic/hypercapnic respiratory failure from COPD exacerbation and pneumonia after recent COVID 19 infection in setting of bullous emphysema.  >>> fever/ PCT w/u per [facility], nothing on cxr 9/11 to suggest pulmonary source  >>> weaned to tbar successfully this am and extubated        Elevated troponin from demand ischemia. Hx of CAD, HTN. -9/8 echo--EF 35-40%, apical HK, trivial MR/TR c/w Takatsubo's  >>> rx per cards   CKD 3a with AKI, improved  Lab Results  Component Value Date   CREATININE 0.84 06/03/2022   CREATININE 1.02 (H) 06/02/2022  CREATININE 1.21 (H) 06/01/2022    >>> resolved   Macrocytic anemia.   Lab Results  Component Value Date   HGB 10.0 (L) 06/04/2022   HGB 9.1 (L) 06/03/2022   HGB 9.5 (L) 06/02/2022     Pulmonary cachexia. -dietician addressing nutritional needs  Hypernatremia/ worsening pre-renal azotemia >>>improving with free water rx     Best Practice (right click and "Reselect all SmartList Selections" daily)    Per [facility]   Labs   CBC: Recent Labs  Lab 05/28/22 1640 05/29/22 0438 05/31/22 0419 06/01/22 0424 06/02/22 0553 06/03/22 0436 06/04/22 0519  WBC 6.3   < > 8.3 8.1 9.3 9.9 11.0*  NEUTROABS 3.7  --   --   --   --   --   --   HGB  8.6*   < > 9.6* 9.2* 9.5* 9.1* 10.0*  HCT 28.5*   < > 32.9* 30.6* 32.1* 29.9* 32.8*  MCV 115.4*   < > 117.9* 116.3* 118.0* 112.8* 112.3*  PLT 341   < > 251 194 198 207 204   < > = values in this interval not displayed.    Basic Metabolic Panel: Recent Labs  Lab 05/31/22 0419 05/31/22 0645 06/01/22 0424 06/02/22 0553 06/03/22 0436  NA 145 145 145 148* 146*  K 5.6* 5.2* 4.6 4.5 3.6  CL 117* 117* 119* 121* 118*  CO2 22 21* 22 24 26   GLUCOSE 137* 142* 192* 199* 257*  BUN 29* 31* 45* 47* 44*  CREATININE 1.34* 1.40* 1.21* 1.02* 0.84  CALCIUM 8.6* 8.4* 8.3* 8.7* 8.7*  MG 1.8  --  1.9 2.0 1.9  PHOS  --   --  2.2*  --  2.9   GFR: Estimated Creatinine Clearance: 41.9 mL/min (by C-G formula based on SCr of 0.84 mg/dL). Recent Labs  Lab 05/29/22 0438 05/30/22 0525 05/31/22 0419 06/01/22 0424 06/02/22 0553 06/03/22 0436 06/04/22 0519  PROCALCITON 0.64  --  2.06 2.19  --   --   --   WBC 5.6   < > 8.3 8.1 9.3 9.9 11.0*   < > = values in this interval not displayed.    Liver Function Tests: Recent Labs  Lab 05/28/22 1640  AST 39  ALT 32  ALKPHOS 107  BILITOT 0.6  PROT 7.5  ALBUMIN 3.3*   No results for input(s): "LIPASE", "AMYLASE" in the last 168 hours. No results for input(s): "AMMONIA" in the last 168 hours.  ABG    Component Value Date/Time   PHART 7.34 (L) 06/01/2022 0753   PCO2ART 43 06/01/2022 0753   PO2ART 121 (H) 06/01/2022 0753   HCO3 23.2 06/01/2022 0753   ACIDBASEDEF 2.8 (H) 06/01/2022 0753   O2SAT 99.5 06/01/2022 0753     Coagulation Profile: No results for input(s): "INR", "PROTIME" in the last 168 hours.  Cardiac Enzymes: No results for input(s): "CKTOTAL", "CKMB", "CKMBINDEX", "TROPONINI" in the last 168 hours.  HbA1C: Hgb A1c MFr Bld  Date/Time Value Ref Range Status  05/07/2022 04:58 PM 5.9 (H) 4.8 - 5.6 % Final    Comment:    (NOTE) Pre diabetes:          5.7%-6.4%  Diabetes:              >6.4%  Glycemic control for   <7.0% adults  with diabetes   09/07/2011 04:40 AM 6.0 (H) <5.7 % Final    Comment:    (NOTE)  According to the ADA Clinical Practice Recommendations for 2011, when HbA1c is used as a screening test:  >=6.5%   Diagnostic of Diabetes Mellitus           (if abnormal result is confirmed) 5.7-6.4%   Increased risk of developing Diabetes Mellitus References:Diagnosis and Classification of Diabetes Mellitus,Diabetes Care,2011,34(Suppl 1):S62-S69 and Standards of Medical Care in         Diabetes - 2011,Diabetes Care,2011,34 (Suppl 1):S11-S61.    CBG: Recent Labs  Lab 06/03/22 1632 06/03/22 2104 06/04/22 0101 06/04/22 0529 06/04/22 0737  GLUCAP 264* 178* 169* 219* 217*       The patient is critically ill with multiple organ systems failure and requires high complexity decision making for assessment and support, frequent evaluation and titration of therapies, application of advanced monitoring technologies and extensive interpretation of multiple databases. Critical Care Time devoted to patient care services described in this note is 35 minutes.   Sandrea Hughs, MD Pulmonary and Critical Care Medicine [facility] Cell [phone]   After 7:00 pm call Elink  [phone]

## 2022-06-04 NOTE — Progress Notes (Signed)
Rounding Note    Patient Name: [name] Date of Encounter: 06/04/2022  [facility] Cardiologist: Little Ishikawa, MD   Subjective   Sitting upright in bed. In restraints. Nods yes/no to questions but not speaking to me.  Extubated this AM; On 40% AFM  HR spiked to 140s in the setting of extubation (possibly sinus tach vs SVT; ECG pending)  Inpatient Medications    Scheduled Meds:  arformoterol  15 mcg Nebulization BID   budesonide (PULMICORT) nebulizer solution  0.5 mg Nebulization BID   Chlorhexidine Gluconate Cloth  6 each Topical Q0600   clopidogrel  75 mg Per Tube Daily   feeding supplement (PROSource TF20)  60 mL Per Tube BID   free water  200 mL Per Tube Q4H   heparin  5,000 Units Subcutaneous Q8H   insulin aspart  0-9 Units Subcutaneous Q4H   methylPREDNISolone (SOLU-MEDROL) injection  40 mg Intravenous Q12H   metoprolol tartrate  7.5 mg Intravenous Q6H   mupirocin ointment   Nasal BID   mouth rinse  15 mL Mouth Rinse Q2H   pantoprazole (PROTONIX) IV  40 mg Intravenous Q12H   phosphorus  500 mg Per Tube BID   polyethylene glycol  17 g Per Tube Daily   revefenacin  175 mcg Nebulization Daily   Continuous Infusions:  azithromycin Stopped (06/03/22 1458)   aztreonam 1 g (06/04/22 0521)   dexmedetomidine (PRECEDEX) IV infusion 1.2 mcg/kg/hr (06/04/22 0523)   feeding supplement (VITAL AF 1.2 CAL) 30 mL/hr at 06/03/22 2357   fentaNYL infusion INTRAVENOUS 125 mcg/hr (06/03/22 2357)   vancomycin Stopped (06/03/22 1607)   PRN Meds: acetaminophen **OR** acetaminophen, albuterol, fentaNYL, fentaNYL (SUBLIMAZE) injection, fentaNYL (SUBLIMAZE) injection, hydrALAZINE, midazolam, ondansetron **OR** ondansetron (ZOFRAN) IV, mouth rinse, polyethylene glycol   Vital Signs    Vitals:   06/04/22 0725 06/04/22 0747 06/04/22 0800 06/04/22 0814  BP:   (!) 150/95   Pulse: (!) 52 (!) 162 (!) 143   Resp: (!) 22 (!) 25 (!) 23   Temp: (!) 96.6 F (35.9  C) (!) 97 F (36.1 C) (!) 97.3 F (36.3 C)   TempSrc:  Bladder    SpO2: 100% 100% 100% 100%  Weight:      Height:        Intake/Output Summary (Last 24 hours) at 06/04/2022 0821 Last data filed at 06/03/2022 2357 Gross per 24 hour  Intake 1738.16 ml  Output 1125 ml  Net 613.16 ml      06/04/2022    5:00 AM 06/03/2022    5:05 AM 06/02/2022    6:00 AM  Last 3 Weights  Weight (lbs) 96 lb 9 oz 100 lb 12 oz 91 lb 14.9 oz  Weight (kg) 43.8 kg 45.7 kg 41.7 kg      Telemetry    Sinus bradycardia overnight; now appears to be in sinus tach in the setting of being extubated - Personally Reviewed  ECG    No new tracing - Personally Reviewed  Physical Exam   GEN: Sitting up in bed, on 40% FiO2, nodding to questions Neck: No JVD Cardiac: Tachycardic, regular, no murmurs Respiratory: Diminished, faint crackles at bases, no wheezing GI: Soft, nontender, non-distended  MS: Thin, warm, no edema Neuro:  Able to nod yes and no Psych: Calm  Labs    High Sensitivity Troponin:   Recent Labs  Lab 05/28/22 1640 05/28/22 2259 05/29/22 0438 05/29/22 0811 05/29/22 1012  TROPONINIHS 13 375* 478* 400* 368*  Chemistry Recent Labs  Lab 05/28/22 1640 05/29/22 0438 06/01/22 0424 06/02/22 0553 06/03/22 0436  NA 137   < > 145 148* 146*  K 4.3   < > 4.6 4.5 3.6  CL 109   < > 119* 121* 118*  CO2 22   < > 22 24 26   GLUCOSE 120*   < > 192* 199* 257*  BUN 17   < > 45* 47* 44*  CREATININE 1.08*   < > 1.21* 1.02* 0.84  CALCIUM 8.6*   < > 8.3* 8.7* 8.7*  MG  --    < > 1.9 2.0 1.9  PROT 7.5  --   --   --   --   ALBUMIN 3.3*  --   --   --   --   AST 39  --   --   --   --   ALT 32  --   --   --   --   ALKPHOS 107  --   --   --   --   BILITOT 0.6  --   --   --   --   GFRNONAA 55*   < > 48* 58* >60  ANIONGAP 6   < > 4* 3* 2*   < > = values in this interval not displayed.    Lipids  Recent Labs  Lab 05/30/22 0525  CHOL 143  TRIG 26  HDL 66  LDLCALC 72  CHOLHDL 2.2     Hematology Recent Labs  Lab 06/02/22 0553 06/03/22 0436 06/04/22 0519  WBC 9.3 9.9 11.0*  RBC 2.72* 2.65* 2.92*  HGB 9.5* 9.1* 10.0*  HCT 32.1* 29.9* 32.8*  MCV 118.0* 112.8* 112.3*  MCH 34.9* 34.3* 34.2*  MCHC 29.6* 30.4 30.5  RDW 16.3* 16.1* 16.3*  PLT 198 207 204   Thyroid  Recent Labs  Lab 05/29/22 0600  TSH 0.554    BNP Recent Labs  Lab 05/28/22 1640 05/30/22 2002  BNP 72.0 1,779.0*    DDimer  Recent Labs  Lab 05/28/22 1725  DDIMER 3.97*     Radiology    DG CHEST PORT 1 VIEW  Result Date: 06/03/2022 CLINICAL DATA:  NG tube placement.  Respiratory failure with hypoxia EXAM: PORTABLE CHEST 1 VIEW COMPARISON:  06/03/2022 FINDINGS: Endotracheal tube is 5.8 cm above the carina. NG tube enters the stomach. Heart is normal size. Increased markings in the lung bases likely reflects scarring. No acute confluent opacities or effusions. Emphysematous changes. No acute bony abnormality. IMPRESSION: COPD. Bibasilar scarring. Support devices as above. Electronically Signed   By: Rolm Baptise M.D.   On: 06/03/2022 22:37   DG Chest Port 1 View  Result Date: 06/03/2022 CLINICAL DATA:  Acute respiratory failure with hypoxia EXAM: PORTABLE CHEST 1 VIEW COMPARISON:  06/01/2022 FINDINGS: Endotracheal and enteric tubes are again identified. Enteric tube has likely retracted some given location of side port. 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 Similar left greater than right interstitial changes. No significant pleural effusion. No pneumothorax. Stable cardiomediastinal contours. IMPRESSION: Similar left greater than right patchy opacities superimposed on emphysema. Enteric tube has likely retracted as side port is now visible just below the gastroesophageal junction. Electronically Signed   By: Macy Mis M.D.   On: 06/03/2022 08:19    Cardiac Studies   05/31/22 TTE: IMPRESSIONS     1. Left ventricular ejection fraction, by estimation, is 35 to 40%. The  left  ventricle has moderately decreased function. The left  ventricle  demonstrates regional wall motion abnormalities (see scoring  diagram/findings for description). The mid septal  and all apical LV segments are akinetic (consistent with prior TTE).   2. Right ventricular systolic function is mildly reduced. The right  ventricular size is normal.   3. The mitral valve is degenerative. Moderate mitral annular  calcification.   Comparison(s): Compared to prior TTE on 05/29/22, there is no significant  change.   05/29/22 TTE: IMPRESSIONS    1. Left ventricular ejection fraction, by estimation, is 35 to 40%. The  left ventricle has moderately decreased function. The left ventricle  demonstrates regional wall motion abnormalities (see scoring  diagram/findings for description). Left ventricular   diastolic parameters are indeterminate.   2. Right ventricular systolic function is mildly reduced. The right  ventricular size is normal. There is moderately elevated pulmonary artery  systolic pressure. The estimated right ventricular systolic pressure is  51.0 mmHg.   3. The mitral valve is degenerative. Trivial mitral valve regurgitation.   4. The aortic valve was not well visualized. Aortic valve regurgitation  is not visualized. No aortic stenosis is present.   5. The inferior vena cava is dilated in size with <50% respiratory  variability, suggesting right atrial pressure of 15 mmHg.   Conclusion(s)/Recommendation(s): Normal systolic function at base with  apical akinesis, differential includes LAD infarct vs stress induced  cardiomyopathy.   Patient Profile     71 y.o. female with a hx of reported CAD with MI/PTCA and possible stent to RCA in 1997, NSTEMI 2004 with occluded RCA treated medically, HTN, dyslipidemia, anxiety, depression with prior SI, remote CVA ~2003, former tobacco abuse, and severe COPD who presented with acute hypoxic respiratory failure in the setting of acute COPD  exacerbation, pneumonia and recent COVID 19 infection with course complicated by NSTEMI and newly diagnosed HFrEF with LVEF 35-40% for which Cardiology was consulted.  Assessment & Plan    #Newly Diagnosed Systolic HF: TTE dropped to 35-40% on admission from 55-60% in 2019 with normal basal LV systolic function and apical akinesis suggestive of stress cardiomyopathy but cannot rule out LAD disease. Has been managed medically thus far in the setting of acute hypoxic respiratory failure and difficulty weaning the vent. Was  extubated today (06/04/22) and is on 40% AFM. Will continue medical optimization for now and plan for ischemic work-up once more clinically stable. -On IV metop for now; transition to PO once able -Has allergy to ACE-I (swelling) and therefore would not add ARB/entresto -Can start spiro, SGLT2i once able -Plan for ischemic work-up once more clinically stable from a respiratory standpoint (has contrast allergy so will need to be pre-treated)  #Known CAD: #NSTEMI: Patient with history of prior PTCA to RCA in 1997 with NSTEMI in 2004 with documented CTO of RCA that was managed medically. Trop on admission elevated to 400s with relatively flat trend. Suspect demand vs true ACS in the setting of acute hypoxic respiratory failure. TTE with EF 35-40% with wall motion concerning for stress CM, but cannot rule out LAD disease. Will plan for ischemic work-up once more clinically stable. -Plan for ischemic work-up once more clinically stable from a respiratory standpoint (has contrast allergy so will need to be pre-treated) -Completed 48h of heparin -Continue plavix (has ASA allergy) -Start crestor 20mg  daily once able to tolerate PO -On IV metop for now; can transition to PO once able -Has allergy to ACE  #Acute Hypoxic Respiratory Failure: Secondary to acute on chronic COPD exacerbation, pneumonia and  recent COVID-19 infection. Had difficulty weaning from the vent but was extubated this  AM 9/14. CCM following.  -Management per pulm/CCM  #CKD IIIA: -Cr back to baseline -Trend BMET       For questions or updates, please contact [place] Please consult [email or web address] for contact info under        Signed, Freada Bergeron, MD  06/04/2022, 8:21 AM

## 2022-06-04 NOTE — Progress Notes (Signed)
Hospitalist aware of VS.

## 2022-06-05 DIAGNOSIS — D649 Anemia, unspecified: Secondary | ICD-10-CM | POA: Diagnosis not present

## 2022-06-05 DIAGNOSIS — I428 Other cardiomyopathies: Secondary | ICD-10-CM | POA: Diagnosis not present

## 2022-06-05 DIAGNOSIS — J181 Lobar pneumonia, unspecified organism: Secondary | ICD-10-CM | POA: Diagnosis not present

## 2022-06-05 DIAGNOSIS — J9602 Acute respiratory failure with hypercapnia: Secondary | ICD-10-CM | POA: Diagnosis not present

## 2022-06-05 DIAGNOSIS — J9601 Acute respiratory failure with hypoxia: Secondary | ICD-10-CM | POA: Diagnosis not present

## 2022-06-05 DIAGNOSIS — E870 Hyperosmolality and hypernatremia: Secondary | ICD-10-CM | POA: Diagnosis not present

## 2022-06-05 DIAGNOSIS — I5021 Acute systolic (congestive) heart failure: Secondary | ICD-10-CM | POA: Diagnosis not present

## 2022-06-05 DIAGNOSIS — I10 Essential (primary) hypertension: Secondary | ICD-10-CM | POA: Diagnosis not present

## 2022-06-05 LAB — CBC
HCT: 31.9 % — ABNORMAL LOW (ref 36.0–46.0)
Hemoglobin: 9.9 g/dL — ABNORMAL LOW (ref 12.0–15.0)
MCH: 34.9 pg — ABNORMAL HIGH (ref 26.0–34.0)
MCHC: 31 g/dL (ref 30.0–36.0)
MCV: 112.3 fL — ABNORMAL HIGH (ref 80.0–100.0)
Platelets: 219 10*3/uL (ref 150–400)
RBC: 2.84 MIL/uL — ABNORMAL LOW (ref 3.87–5.11)
RDW: 17 % — ABNORMAL HIGH (ref 11.5–15.5)
WBC: 16 10*3/uL — ABNORMAL HIGH (ref 4.0–10.5)
nRBC: 0.6 % — ABNORMAL HIGH (ref 0.0–0.2)

## 2022-06-05 LAB — GLUCOSE, CAPILLARY
Glucose-Capillary: 129 mg/dL — ABNORMAL HIGH (ref 70–99)
Glucose-Capillary: 136 mg/dL — ABNORMAL HIGH (ref 70–99)
Glucose-Capillary: 144 mg/dL — ABNORMAL HIGH (ref 70–99)
Glucose-Capillary: 145 mg/dL — ABNORMAL HIGH (ref 70–99)
Glucose-Capillary: 154 mg/dL — ABNORMAL HIGH (ref 70–99)
Glucose-Capillary: 174 mg/dL — ABNORMAL HIGH (ref 70–99)

## 2022-06-05 MED ORDER — MIRTAZAPINE 15 MG PO TABS
7.5000 mg | ORAL_TABLET | Freq: Every day | ORAL | Status: DC
  Administered 2022-06-05 – 2022-06-08 (×3): 7.5 mg via ORAL
  Filled 2022-06-05 (×4): qty 1

## 2022-06-05 MED ORDER — ORAL CARE MOUTH RINSE: 15.0000 mL | OROMUCOSAL | Status: DC | PRN

## 2022-06-05 MED ORDER — POLYETHYLENE GLYCOL 3350 17 G PO PACK
17.0000 g | PACK | Freq: Every day | ORAL | Status: DC
  Administered 2022-06-09: 17 g via ORAL
  Filled 2022-06-05 (×6): qty 1

## 2022-06-05 MED ORDER — CITALOPRAM HYDROBROMIDE 20 MG PO TABS
20.0000 mg | ORAL_TABLET | Freq: Every day | ORAL | Status: DC
  Administered 2022-06-05 – 2022-06-09 (×5): 20 mg via ORAL
  Filled 2022-06-05 (×6): qty 1

## 2022-06-05 MED ORDER — METOPROLOL TARTRATE 5 MG/5ML IV SOLN
10.0000 mg | Freq: Four times a day (QID) | INTRAVENOUS | Status: DC
  Administered 2022-06-05 – 2022-06-06 (×4): 10 mg via INTRAVENOUS
  Filled 2022-06-05 (×5): qty 10

## 2022-06-05 MED ORDER — METHYLPREDNISOLONE SODIUM SUCC 40 MG IJ SOLR
40.0000 mg | Freq: Every day | INTRAMUSCULAR | Status: DC
  Administered 2022-06-06 – 2022-06-08 (×3): 40 mg via INTRAVENOUS
  Filled 2022-06-05 (×3): qty 1

## 2022-06-05 MED ORDER — CLOPIDOGREL BISULFATE 75 MG PO TABS
75.0000 mg | ORAL_TABLET | Freq: Every day | ORAL | Status: DC
  Administered 2022-06-05 – 2022-06-09 (×5): 75 mg via ORAL
  Filled 2022-06-05 (×6): qty 1

## 2022-06-05 MED ORDER — SACCHAROMYCES BOULARDII 250 MG PO CAPS
250.0000 mg | ORAL_CAPSULE | Freq: Two times a day (BID) | ORAL | Status: DC
  Administered 2022-06-05 – 2022-06-08 (×5): 250 mg via ORAL
  Filled 2022-06-05 (×10): qty 1

## 2022-06-05 MED ORDER — ENSURE ENLIVE PO LIQD
237.0000 mL | Freq: Three times a day (TID) | ORAL | Status: DC
  Administered 2022-06-05 – 2022-06-17 (×13): 237 mL via ORAL

## 2022-06-05 MED ORDER — K PHOS MONO-SOD PHOS DI & MONO 155-852-130 MG PO TABS
500.0000 mg | ORAL_TABLET | Freq: Two times a day (BID) | ORAL | Status: DC
  Administered 2022-06-05 – 2022-06-08 (×6): 500 mg via ORAL
  Filled 2022-06-05 (×11): qty 2

## 2022-06-05 NOTE — Progress Notes (Signed)
NAME:  [name], MRN:  [id number], DOB:  1951/02/16, LOS: 8 ADMISSION DATE:  05/28/2022, CONSULTATION DATE:  05/29/2022 REFERRING MD:  Dr. Arbutus Leas, [facility], CHIEF COMPLAINT:  Dyspnea   History of Present Illness:  71 yo female former smoker presented to [facility] ER with dyspnea.  SpO2 77% on room air.  Admitted  8/12 to 8/19 with COVID 19 infection with AKI and colitis, but didn't need supplemental oxygen at discharge.  Her breathing has gotten progressive worse since hospital discharge.  Her chest xray showed hyperinflation with bullous changes.  She was started on supplemental oxygen and Bipap, and started on therapy for possible COPD exacerbation.  PCCM asked to assist with respiratory management in ICU.  Pertinent  Medical History  Anxiety, Blindness Rt eye, CVA, GERD, HLD, HTN, Depression, CAD s/p stent, CKD 3a  Significant Hospital Events: Including procedures, antibiotic start and stop dates in addition to other pertinent events   9/07 Admit, start on Bipap 9/08 Cardiology consulted for elevated troponin, start heparin gtt 9/8 rx zmax/vanc/aztreonam>>>  ET 9/9 > out 9/14   Studies:  Echo 9/08 >-EF 35-40%, apical HK, trivial MR/TR c/w Takatsubo's  CT angio chest 9/08 >> centrilobular/paraseptal emphysema with bullous emphysema RUL, patchy infiltrate RUL/RML/RLL/LLL, small Rt effusion MRSA PCR screen 9/8  POS    Scheduled Meds:  arformoterol  15 mcg Nebulization BID   budesonide (PULMICORT) nebulizer solution  0.5 mg Nebulization BID   Chlorhexidine Gluconate Cloth  6 each Topical Q0600   clopidogrel  75 mg Oral Daily   heparin  5,000 Units Subcutaneous Q8H   insulin aspart  0-9 Units Subcutaneous Q4H   methylPREDNISolone (SOLU-MEDROL) injection  40 mg Intravenous Q12H   metoprolol tartrate  10 mg Intravenous Q6H   mupirocin ointment   Nasal BID   mouth rinse  15 mL Mouth Rinse QID   pantoprazole (PROTONIX) IV  40 mg Intravenous Q12H   phosphorus  500 mg Oral BID   polyethylene  glycol  17 g Oral Daily   revefenacin  175 mcg Nebulization Daily   sodium chloride flush  10-40 mL Intracatheter Q12H   Continuous Infusions:  azithromycin Stopped (06/04/22 1717)   aztreonam 1 g (06/05/22 0520)   dexmedetomidine (PRECEDEX) IV infusion 0.8 mcg/kg/hr (06/05/22 0954)   vancomycin Stopped (06/04/22 1621)   PRN Meds:.acetaminophen **OR** acetaminophen, albuterol, fentaNYL, fentaNYL (SUBLIMAZE) injection, hydrALAZINE, midazolam, ondansetron **OR** ondansetron (ZOFRAN) IV, mouth rinse, polyethylene glycol, sodium chloride flush    Interim History / Subjective:  NAD/ very weak cough mechanics   Objective   BP (!) 154/79   Pulse 89   Temp (!) 97.1 F (36.2 C)   Resp 17   Ht 5' (1.524 m)   Wt 44.7 kg   SpO2 97%   BMI 19.25 kg/m       Intake/Output Summary (Last 24 hours) at 06/05/2022 1414 Last data filed at 06/04/2022 2347 Gross per 24 hour  Intake 1375.66 ml  Output --  Net 1375.66 ml   Filed Weights   06/03/22 0505 06/04/22 0500 06/05/22 0500  Weight: 45.7 kg 43.8 kg 44.7 kg    Examination: Tmax:  99 General appearance:    elderly bf nad at 45 degrees   At Rest 02 sats  96% on 3lpm   No jvd Oropharynx clear,  mucosa nl Neck supple Lungs with a few scattered exp > insp rhonchi bilaterally RRR no s3 or or sign murmur Abd soft with limited  excursion  Extr warm with no  edema or clubbing noted Neuro  Sensorium intact,  no apparent motor deficits    I personally reviewed images and agree with radiology impression as follows:  CXR:   portable 9/13 COPD.Bibasilar scarring.  Resolved Hospital Problem list     Assessment & Plan:   Acute hypoxic/hypercapnic respiratory failure from COPD exacerbation and pneumonia after recent COVID 19 infection in setting of bullous emphysema.  >>> fever/ PCT w/u per [facility], nothing on cxr 9/11 to suggest pulmonary source  >>> weaned to tbar successfully am 9/14 and extubated to NP but very marginal cough mechanics  and ventilatory reserve.  >>> up in chair as tol / titrate 02 sats to low to mid 90s PCCM f/u is prn        Elevated troponin from demand ischemia. Hx of CAD, HTN. -9/8 echo--EF 35-40%, apical HK, trivial MR/TR c/w Takatsubo's  >>> rx per cards   CKD 3a with AKI, improved  Lab Results  Component Value Date   CREATININE 0.84 06/03/2022   CREATININE 1.02 (H) 06/02/2022   CREATININE 1.21 (H) 06/01/2022    >>> resolved   Macrocytic anemia.   Lab Results  Component Value Date   HGB 9.9 (L) 06/05/2022   HGB 10.0 (L) 06/04/2022   HGB 9.1 (L) 06/03/2022  Per [facility]   Pulmonary cachexia. -dietician addressing nutritional needs  Hypernatremia/ worsening pre-renal azotemia >>>improving with free water rx     Best Practice (right click and "Reselect all SmartList Selections" daily)    Per [facility]   Labs   CBC: Recent Labs  Lab 06/01/22 0424 06/02/22 0553 06/03/22 0436 06/04/22 0519 06/05/22 0536  WBC 8.1 9.3 9.9 11.0* 16.0*  HGB 9.2* 9.5* 9.1* 10.0* 9.9*  HCT 30.6* 32.1* 29.9* 32.8* 31.9*  MCV 116.3* 118.0* 112.8* 112.3* 112.3*  PLT 194 198 207 204 219    Basic Metabolic Panel: Recent Labs  Lab 05/31/22 0419 05/31/22 0645 06/01/22 0424 06/02/22 0553 06/03/22 0436  NA 145 145 145 148* 146*  K 5.6* 5.2* 4.6 4.5 3.6  CL 117* 117* 119* 121* 118*  CO2 22 21* 22 24 26   GLUCOSE 137* 142* 192* 199* 257*  BUN 29* 31* 45* 47* 44*  CREATININE 1.34* 1.40* 1.21* 1.02* 0.84  CALCIUM 8.6* 8.4* 8.3* 8.7* 8.7*  MG 1.8  --  1.9 2.0 1.9  PHOS  --   --  2.2*  --  2.9   GFR: Estimated Creatinine Clearance: 42.7 mL/min (by C-G formula based on SCr of 0.84 mg/dL). Recent Labs  Lab 05/31/22 0419 06/01/22 0424 06/02/22 0553 06/03/22 0436 06/04/22 0519 06/05/22 0536  PROCALCITON 2.06 2.19  --   --   --   --   WBC 8.3 8.1 9.3 9.9 11.0* 16.0*    Liver Function Tests: No results for input(s): "AST", "ALT", "ALKPHOS", "BILITOT", "PROT", "ALBUMIN" in the last 168  hours.  No results for input(s): "LIPASE", "AMYLASE" in the last 168 hours. No results for input(s): "AMMONIA" in the last 168 hours.  ABG    Component Value Date/Time   PHART 7.34 (L) 06/01/2022 0753   PCO2ART 43 06/01/2022 0753   PO2ART 121 (H) 06/01/2022 0753   HCO3 23.2 06/01/2022 0753   ACIDBASEDEF 2.8 (H) 06/01/2022 0753   O2SAT 99.5 06/01/2022 0753     Coagulation Profile: No results for input(s): "INR", "PROTIME" in the last 168 hours.  Cardiac Enzymes: No results for input(s): "CKTOTAL", "CKMB", "CKMBINDEX", "TROPONINI" in the last 168 hours.  HbA1C: Hgb A1c MFr Bld  Date/Time Value Ref Range Status  05/07/2022 04:58 PM 5.9 (H) 4.8 - 5.6 % Final    Comment:    (NOTE) Pre diabetes:          5.7%-6.4%  Diabetes:              >6.4%  Glycemic control for   <7.0% adults with diabetes   09/07/2011 04:40 AM 6.0 (H) <5.7 % Final    Comment:    (NOTE)                                                                       According to the ADA Clinical Practice Recommendations for 2011, when HbA1c is used as a screening test:  >=6.5%   Diagnostic of Diabetes Mellitus           (if abnormal result is confirmed) 5.7-6.4%   Increased risk of developing Diabetes Mellitus References:Diagnosis and Classification of Diabetes Mellitus,Diabetes Care,2011,34(Suppl 1):S62-S69 and Standards of Medical Care in         Diabetes - 2011,Diabetes Care,2011,34 (Suppl 1):S11-S61.    CBG: Recent Labs  Lab 06/04/22 2118 06/05/22 0003 06/05/22 0541 06/05/22 0751 06/05/22 1133  GLUCAP 167* 154* 129* 144* 145*     Sandrea Hughs, MD Pulmonary and Critical Care Medicine [facility] Cell [phone]   After 7:00 pm call Elink  [phone]

## 2022-06-05 NOTE — Progress Notes (Signed)
PROGRESS NOTE  [name] [id number] DOB: 1951-03-15 DOA: 05/28/2022 PCP: Carrolyn Meiers, MD  Brief History:  71 year old female with a history of coronary disease, hypertension, hyperlipidemia, functional constipation, B12 and folate deficiency, anxiety, and prior stroke presenting with shortness of breath and respiratory failure.  The patient was recently mid to the hospital from 05/02/2022 to 05/09/2022 when she had acute respiratory failure secondary to COVID-19.  During that hospitalization, the patient was also noted to have colitis for which she was treated with levofloxacin and metronidazole.  She completed a course of antibiotics prior to discharge.  She was discharged home with a prednisone taper. She states that she has had some shortness of breath since discharge from the hospital, but this has significantly worsened in the 24 hours prior to admission.  Upon arrival to the emergency department, the patient was noted to have respiratory distress patient was placed on BiPAP. Repeat ABG on BiPAP did not show significant improvement.  There was plans for intubation, but the patient was given Ativan with improvement clinically.  She remained on BiPAP overnight.  Notably, the patient states that she started having some burning chest discomfort on the morning of 05/28/2022.  She states that it somewhat improved while she was on BiPAP, but continues to have the chest discomfort.  She was taken off of BiPAP in the morning of 05/29/2022 and stated that she had some worsening of her burning chest discomfort.  She developed some increased shortness of breath resulting in placement back on BiPAP on the morning of 05/29/22.  Troponins were noted to be 375>> 478.  D-dimer was also noted to be elevated 3.97.  The patient was started on IV heparin. The patient was given Solu-Medrol 125 mg IV, morphine 1 mg IV and placed back on BiPAP.  Repeat EKG showed sinus tachycardia with T wave  inversion V2-V5.  Troponins continue to be cycled. cardiology was consulted to assist with management.   On the evening of 05/30/2022, the patient developed respiratory distress and hypoxia on BiPAP.  She remained agitated despite pharmacologic and nonpharmacologic interventions.  After discussion with the patient's family, decision was made to intubate the patient.  Repeat limited echocardiogram was ordered for 05/31/22.     Assessment and Plan: * Acute respiratory failure with hypoxia and hypercarbia (HCC) - Presented with respiratory distress with oxygen saturation 77% on room air.  Appears to be secondary to COPD exacerbation and pneumonia -Patient failed the use of BiPAP and in the requiring to be intubated and mechanically ventilated.  (05/30/2022) -CTA chest--neg PE, severe emphysema; multifocal patchy infiltrates RUL, RLL, LLL -Patient has been extubated (06/04/2022);  -Currently on 3-4 L nasal cannula supplementation and reporting no significant shortness of breath -Continue weaning patient off of Precedex and continue current bronchodilator management. -Will start tapering steroids process. -Appreciate pulmonology assistance. -Continue supportive care, current antibiotics and the use of flutter valve.   COPD with acute exacerbation ([place]) -Although the patient denies a history of COPD and has not had formal PFTs--> she endorses smoking over 30 years, 1/2 pack/day. -Quit smoking 7 years ago -Continue Yupelri/Brovana -Continue IV Solu-Medrol; statin tapering off process -Continue Pulmicort and pulmonary toiletry.   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 -EKG--sinus rhythm, T wave depression V2-V5 -9/8 echo--EF 35-40%, apical HK, trivial MR/TR -9/10--repeat limited Echo-EF 35-40%, unchanged -9/11 started on Plavix after finalization  of heparin drip. -Allergy  planning the possibility for cardiac catheterization once medically stable stable and able to lay down flat.  Lobar pneumonia ([place]) -CTA chest as discussed above -MRSA+ -Continue vanc/aztreonam/azithro -PCT 0.64 -Tracheal aspiration without any growth. -Continue supportive care.  Hypernatremia - Continue to maintain adequate amount of free water through tube feedings -Follow electrolytes.  Impaired glucose tolerance -05/07/22 A1C--5.4 -Continue sliding scale insulin -Elevated CBGs in the setting of steroids usage and tube feedings.  Hypophosphatemia - Continue to follow electrolytes and replete as needed.  Hyperkalemia -Given lokelma -Electrolytes stable currently -Continue telemetry monitoring and follow electrolytes trend.  Cardiomyopathy ([place]) -9/8 echo--EF 35-40%, apical HK, trivial MR/TR -Discussed with cardiology>>suspect Takatsubo's  -finish 48 hours IV heparin 9/11>> patient has been started on Plavix.   Repeat limited Echo EF 35-40%, unchanged  Malnutrition of moderate degree -Advance diet -Count calories intake -Follow response.  Anemia -No signs of active blood loss -Hgb stable since admission -baseslin Hgb 10-11 -B12--674 -Folate 35.6 -Iron sat 37, ferritin 7392 -Continue to follow hemoglobin trend.  Chronic kidney disease, stage 3a ([place]) -Baseline creatinine 0.8-1.1 -Continue to follow renal function trend/stability.   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 -Continue risk factor modification. -No new focal deficits.  Essential hypertension -Continue metoprolol -Given IV currently. -Per cardiology service planning to use bisoprolol when fully able to take p.o.'s.  Family Communication: No family at bedside for updates.   Consultants:  pulm, cardiology   Code Status:  FULL    DVT Prophylaxis:   Heparin      Procedures: As Listed in Progress  Note Above   Antibiotics: Vanc 9/8>> Aztreonam 9/8>> Azithro 9/8>>     Subjective: No fever, no chest pain, no nausea, no vomiting.  Currently demonstrating good saturation on 3-4 L nasal cannula supplementation.  In no acute distress.  Having multiple intermittent episodes of diarrhea.  Objective: Vitals:   06/05/22 1100 06/05/22 1200 06/05/22 1226 06/05/22 1300  BP: (!) 151/95   (!) 154/79  Pulse: (!) 106   89  Resp: (!) 26 (!) 23  17  Temp:   (!) 97.1 F (36.2 C)   TempSrc:      SpO2: 95%   97%  Weight:      Height:        Intake/Output Summary (Last 24 hours) at 06/05/2022 1855 Last data filed at 06/05/2022 1600 Gross per 24 hour  Intake 1611.97 ml  Output 650 ml  Net 961.97 ml   Weight change: 0.9 kg Exam: General exam: Alert, awake, oriented x 3; following commands appropriately and reporting no chest pain, no nausea, no vomiting.  Overall with improved meant in her breathing.  Patient having diarrhea.  No fever Respiratory system: Clear to auscultation. Respiratory effort normal.  Positive scattered rhonchi; mild expiratory wheezing.  No using accessory muscle. Cardiovascular system: Irregular, no rubs, no gallops, no JVD. Gastrointestinal system: Abdomen is nondistended, soft and nontender. No organomegaly or masses felt. Normal bowel sounds heard. Central nervous system: Alert and oriented. No focal neurological deficits. Extremities: No cyanosis, clubbing or edema. Skin: No petechiae. Psychiatry: Judgement and insight appear normal.  Stable mood currently.   Data Reviewed: I have personally reviewed following labs and imaging studies  Basic Metabolic Panel: Recent Labs  Lab 05/31/22 0419 05/31/22 0645 06/01/22 0424 06/02/22 0553 06/03/22 0436  NA 145 145 145 148* 146*  K 5.6* 5.2* 4.6 4.5 3.6  CL 117*  117* 119* 121* 118*  CO2 22 21* 22 24 26   GLUCOSE 137* 142* 192* 199* 257*  BUN 29* 31* 45* 47* 44*  CREATININE 1.34* 1.40* 1.21* 1.02* 0.84   CALCIUM 8.6* 8.4* 8.3* 8.7* 8.7*  MG 1.8  --  1.9 2.0 1.9  PHOS  --   --  2.2*  --  2.9   Liver Function Tests: No results for input(s): "AST", "ALT", "ALKPHOS", "BILITOT", "PROT", "ALBUMIN" in the last 168 hours.  CBC: Recent Labs  Lab 06/01/22 0424 06/02/22 0553 06/03/22 0436 06/04/22 0519 06/05/22 0536  WBC 8.1 9.3 9.9 11.0* 16.0*  HGB 9.2* 9.5* 9.1* 10.0* 9.9*  HCT 30.6* 32.1* 29.9* 32.8* 31.9*  MCV 116.3* 118.0* 112.8* 112.3* 112.3*  PLT 194 198 207 204 219   CBG: Recent Labs  Lab 06/05/22 0003 06/05/22 0541 06/05/22 0751 06/05/22 1133 06/05/22 1618  GLUCAP 154* 129* 144* 145* 136*   HbA1C: No results for input(s): "HGBA1C" in the last 72 hours.  Urine analysis:    Component Value Date/Time   COLORURINE YELLOW 06/02/2022 1637   APPEARANCEUR CLEAR 06/02/2022 1637   LABSPEC 1.016 06/02/2022 1637   PHURINE 5.0 06/02/2022 1637   GLUCOSEU NEGATIVE 06/02/2022 1637   HGBUR NEGATIVE 06/02/2022 1637   BILIRUBINUR NEGATIVE 06/02/2022 1637   KETONESUR NEGATIVE 06/02/2022 1637   PROTEINUR NEGATIVE 06/02/2022 1637   UROBILINOGEN 1.0 07/16/2014 0244   NITRITE NEGATIVE 06/02/2022 1637   LEUKOCYTESUR NEGATIVE 06/02/2022 1637   Sepsis Labs:  Recent Results (from the past 240 hour(s))  Resp Panel by RT-PCR (Flu A&B, Covid) Anterior Nasal Swab     Status: None   Collection Time: 05/28/22  4:27 PM   Specimen: Anterior Nasal Swab  Result Value Ref Range Status   SARS Coronavirus 2 by RT PCR NEGATIVE NEGATIVE Final    Comment: (NOTE) SARS-CoV-2 target nucleic acids are NOT DETECTED.  The SARS-CoV-2 RNA is generally detectable in upper respiratory specimens during the acute phase of infection. The lowest concentration of SARS-CoV-2 viral copies this assay can detect is 138 copies/mL. A negative result does not preclude SARS-Cov-2 infection and should not be used as the sole basis for treatment or other patient management decisions. A negative result may occur with   improper specimen collection/handling, submission of specimen other than nasopharyngeal swab, presence of viral mutation(s) within the areas targeted by this assay, and inadequate number of viral copies(<138 copies/mL). A negative result must be combined with clinical observations, patient history, and epidemiological information. The expected result is Negative.  Fact Sheet for Patients:  EntrepreneurPulse.com.au  Fact Sheet for Healthcare Providers:  IncredibleEmployment.be  This test is no t yet approved or cleared by the [place] FDA and  has been authorized for detection and/or diagnosis of SARS-CoV-2 by FDA under an Emergency Use Authorization (EUA). This EUA will remain  in effect (meaning this test can be used) for the duration of the COVID-19 declaration under Section 564(b)(1) of the Act, 21 U.S.C.section 360bbb-3(b)(1), unless the authorization is terminated  or revoked sooner.       Influenza A by PCR NEGATIVE NEGATIVE Final   Influenza B by PCR NEGATIVE NEGATIVE Final    Comment: (NOTE) The Xpert Xpress SARS-CoV-2/FLU/RSV plus assay is intended as an aid in the diagnosis of influenza from Nasopharyngeal swab specimens and should not be used as a sole basis for treatment. Nasal washings and aspirates are unacceptable for Xpert Xpress SARS-CoV-2/FLU/RSV testing.  Fact Sheet for Patients: EntrepreneurPulse.com.au  Fact Sheet for Healthcare  Providers: IncredibleEmployment.be  This test is not yet approved or cleared by the [place] and has been authorized for detection and/or diagnosis of SARS-CoV-2 by FDA under an Emergency Use Authorization (EUA). This EUA will remain in effect (meaning this test can be used) for the duration of the COVID-19 declaration under Section 564(b)(1) of the Act, 21 U.S.C. section 360bbb-3(b)(1), unless the authorization is terminated  or revoked.  Performed at [facility], [address], [place], [place] [address]   MRSA Next Gen by PCR, Nasal     Status: Abnormal   Collection Time: 05/29/22  2:27 AM   Specimen: Nasal Mucosa; Nasal Swab  Result Value Ref Range Status   MRSA by PCR Next Gen DETECTED (A) NOT DETECTED Final    Comment: RESULT CALLED TO, READ BACK BY AND VERIFIED WITH: MILLS M @ 1106 ON OP:[id number] BY HENDERSON L (NOTE) The GeneXpert MRSA Assay (FDA approved for NASAL specimens only), is one component of a comprehensive MRSA colonization surveillance program. It is not intended to diagnose MRSA infection nor to guide or monitor treatment for MRSA infections. Test performance is not FDA approved in patients less than 22 years old. Performed at [facility], [address], [place], [place] [address]   Culture, Respiratory w Gram Stain     Status: None   Collection Time: 05/31/22  8:00 AM   Specimen: Tracheal Aspirate; Respiratory  Result Value Ref Range Status   Specimen Description   Final    TRACHEAL ASPIRATE Performed at [facility], [address] [place], [place] [address]    Special Requests   Final    NONE Performed at [facility], [address], [place], [place] [address]    Gram Stain NO WBC SEEN NO ORGANISMS SEEN   Final   Culture   Final    NO GROWTH Performed at [facility], [place] [address] [place], [place] [address]    Report Status 06/02/2022 FINAL  Final  Urine Culture     Status: None   Collection Time: 06/02/22  4:37 PM   Specimen: Urine, Catheterized  Result Value Ref Range Status   Specimen Description   Final    URINE, CATHETERIZED Performed at [facility], [address] [place], [place] [address]    Special Requests   Final    NONE Performed at [facility], [address], [place], [place] [address]    Culture   Final    NO GROWTH Performed at [facility], [place] [address] [place], [place] [address]    Report Status 06/03/2022 FINAL  Final      Scheduled Meds:  arformoterol  15 mcg Nebulization BID   budesonide (PULMICORT) nebulizer solution  0.5 mg Nebulization BID   Chlorhexidine Gluconate Cloth  6 each Topical Q0600   citalopram  20 mg Oral Daily   clopidogrel  75 mg Oral Daily   feeding supplement  237 mL Oral TID BM   heparin  5,000 Units Subcutaneous Q8H   insulin aspart  0-9 Units Subcutaneous Q4H   [START ON 06/06/2022] methylPREDNISolone (SOLU-MEDROL) injection  40 mg Intravenous Daily   metoprolol tartrate  10 mg Intravenous Q6H   mirtazapine  7.5 mg Oral QHS   mupirocin ointment   Nasal BID   pantoprazole (PROTONIX) IV  40 mg Intravenous Q12H   phosphorus  500 mg Oral BID   polyethylene glycol  17 g Oral Daily   revefenacin  175 mcg Nebulization Daily  saccharomyces boulardii  250 mg Oral BID   sodium chloride flush  10-40 mL Intracatheter Q12H   Continuous Infusions:  azithromycin 500 mg (06/05/22 1604)   aztreonam 1 g (06/05/22 1508)   dexmedetomidine (PRECEDEX) IV infusion 0.8 mcg/kg/hr (06/05/22 1450)   vancomycin 750 mg (06/05/22 1511)    Procedures/Studies: [place] EKG SITE RITE  Result Date: 06/04/2022 If Site Rite image not attached, placement could not be confirmed due to current cardiac rhythm.  [place] EKG SITE RITE  Result Date: 06/04/2022 If Site Rite image not attached, placement could not be confirmed due to current cardiac rhythm.  DG CHEST PORT 1 VIEW  Result Date: 06/03/2022 CLINICAL DATA:  NG tube placement.  Respiratory failure with hypoxia EXAM: PORTABLE CHEST 1 VIEW COMPARISON:  06/03/2022 FINDINGS: Endotracheal tube is 5.8 cm above the carina. NG tube enters the stomach. Heart is normal size. Increased markings in the lung bases likely reflects scarring. No acute confluent opacities or effusions. Emphysematous changes. No acute bony abnormality. IMPRESSION: COPD. Bibasilar scarring. Support devices as above. Electronically Signed   By: Rolm Baptise M.D.   On: 06/03/2022 22:37   DG Chest  Port 1 View  Result Date: 06/03/2022 CLINICAL DATA:  Acute respiratory failure with hypoxia EXAM: PORTABLE CHEST 1 VIEW COMPARISON:  06/01/2022 FINDINGS: Endotracheal and enteric tubes are again identified. Enteric tube has likely retracted some given location of side port. 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 Similar left greater than right interstitial changes. No significant pleural effusion. No pneumothorax. Stable cardiomediastinal contours. IMPRESSION: Similar left greater than right patchy opacities superimposed on emphysema. Enteric tube has likely retracted as side port is now visible just below the gastroesophageal junction. Electronically Signed   By: Macy Mis M.D.   On: 06/03/2022 08:19   DG CHEST PORT 1 VIEW  Result Date: 06/01/2022 CLINICAL DATA:  Pneumonia EXAM: PORTABLE CHEST 1 VIEW COMPARISON:  Previous studies including the examination of 05/31/2022 FINDINGS: Tip of endotracheal tube is 7 cm above the carina. Enteric tube is noted traversing the esophagus. Cardiac size is within normal limits. There is slight improvement in infiltrates in left lung. There is possible slight worsening of infiltrate in right parahilar region. Bullous emphysema is seen in right upper lung field limiting evaluation for small pneumothorax. There is no significant pleural effusion. No definite pneumothorax is seen. IMPRESSION: There is decrease in extensive interstitial infiltrates in left lung. There is slight worsening of infiltrate in right parahilar region. Severe bullous emphysema in the right upper lung field. Electronically Signed   By: Elmer Picker M.D.   On: 06/01/2022 09:17   ECHOCARDIOGRAM LIMITED  Result Date: 05/31/2022    ECHOCARDIOGRAM LIMITED REPORT   [name] Date of Exam: 05/31/2022 Medical Rec #:  [id number]         Height:       60.0 in Accession #:    [id number]        Weight:       90.2 lb Date of Birth:  1951-08-26         BSA:          1.330 m  Patient Age:    26 years          BP:           125/55 mmHg Patient Gender: F                 HR:  101 bpm. Exam Location:  [facility] Procedure: Limited Echo Indications:    CHF-Acute Systolic [id number]  History:        Patient has prior history of Echocardiogram examinations, most                 recent 05/29/2022. Previous Myocardial Infarction and CAD, COPD                 and Stroke; Risk Factors:Hypertension and Dyslipidemia. Hx of                 CKD, COVID-10.  Sonographer:    Alvino Chapel RCS Referring Phys: [id number] DAVID TAT  Sonographer Comments: *Patient on mechanical ventilator at present. IMPRESSIONS  1. Left ventricular ejection fraction, by estimation, is 35 to 40%. The left ventricle has moderately decreased function. The left ventricle demonstrates regional wall motion abnormalities (see scoring diagram/findings for description). The mid septal and all apical LV segments are akinetic (consistent with prior TTE).  2. Right ventricular systolic function is mildly reduced. The right ventricular size is normal.  3. The mitral valve is degenerative. Moderate mitral annular calcification. Comparison(s): Compared to prior TTE on 05/29/22, there is no significant change. FINDINGS  Left Ventricle: The mid septal and all apical LV segments are akinetic. Left ventricular ejection fraction, by estimation, is 35 to 40%. The left ventricle has moderately decreased function. The left ventricle demonstrates regional wall motion abnormalities. The left ventricular internal cavity size was normal in size. There is no left ventricular hypertrophy. Right Ventricle: The right ventricular size is normal. Right ventricular systolic function is mildly reduced. Pericardium: There is no evidence of pericardial effusion. Mitral Valve: The mitral valve is degenerative in appearance. There is mild thickening of the mitral valve leaflet(s). There is mild calcification of the mitral valve leaflet(s). Moderate mitral annular  calcification. Aorta: The aortic root is normal in size and structure. LEFT VENTRICLE PLAX 2D LVIDd:         3.90 cm LVIDs:         3.30 cm LV PW:         0.90 cm LV IVS:        0.90 cm LVOT diam:     1.70 cm LVOT Area:     2.27 cm  LV Volumes (MOD) LV vol d, MOD A2C: 38.7 ml LV vol d, MOD A4C: 50.3 ml LV vol s, MOD A2C: 21.4 ml LV vol s, MOD A4C: 26.8 ml LV SV MOD A2C:     17.3 ml LV SV MOD A4C:     50.3 ml LV SV MOD BP:      20.7 ml LEFT ATRIUM         Index LA diam:    3.20 cm 2.41 cm/m   AORTA Ao Root diam: 2.50 cm  SHUNTS Systemic Diam: 1.70 cm Gwyndolyn Kaufman MD Electronically signed by Gwyndolyn Kaufman MD Signature Date/Time: 05/31/2022/1:31:58 PM    Final    DG Chest 1 View  Result Date: 05/31/2022 CLINICAL DATA:  Shortness of breath EXAM: CHEST  1 VIEW COMPARISON:  05/30/2022 FINDINGS: Endotracheal tube terminates 7 cm above the carina. Multifocal patchy left lung opacities, favoring pneumonia, possibly on the basis of aspiration. Right lung is essentially clear. No pleural effusion or pneumothorax. The heart is normal in size. Enteric tube courses into the stomach. Cholecystectomy clips. IMPRESSION: Endotracheal tube terminates 7 cm above the carina. Multifocal patchy left lung opacities, favoring pneumonia, possibly on the basis of aspiration. Electronically  Signed   By: Charline Bills M.D.   On: 05/31/2022 03:54   DG CHEST PORT 1 VIEW  Result Date: 05/30/2022 CLINICAL DATA:  Endotracheal and orogastric tube. EXAM: PORTABLE CHEST 1 VIEW COMPARISON:  Chest CT 05/29/2022.  Chest x-ray 05/30/2009 3. FINDINGS: Endotracheal tube tip is 5 cm above the carina. Enteric tube tip is in the mid stomach. There are increasing central interstitial and airspace opacities throughout the left lung. Severe emphysematous changes are again seen in the right upper lobe. There is no pleural effusion or pneumothorax. No acute fractures are seen. Cardiomediastinal silhouette is within normal limits. IMPRESSION: 1.  Endotracheal tube tip 5 cm above carina. 2. Enteric tube tip at the level of the mid stomach. 3. Increasing left lung interstitial and airspace opacities which may represent edema or infection. 4. Stable severe emphysema. Electronically Signed   By: Darliss Cheney M.D.   On: 05/30/2022 19:50   DG CHEST PORT 1 VIEW  Result Date: 05/30/2022 CLINICAL DATA:  Respiratory failure, pneumonia EXAM: PORTABLE CHEST 1 VIEW COMPARISON:  Previous studies including chest radiograph done on 05/28/2022 and CT done on 05/29/2022 FINDINGS: There is diffuse increase in patchy interstitial and alveolar densities in both lungs, more so on the left side. There is relative sparing of right upper lung field due to underlying bullous emphysema. There is blunting of both lateral CP angles. There is no pneumothorax. IMPRESSION: There is diffuse increase in interstitial and alveolar markings in both lungs, more so on the left side. Findings suggest asymmetric pulmonary edema or worsening multifocal bilateral pneumonia. Small bilateral pleural effusions. Electronically Signed   By: Ernie Avena M.D.   On: 05/30/2022 10:05   ECHOCARDIOGRAM COMPLETE  Result Date: 05/29/2022    ECHOCARDIOGRAM REPORT   [name] Date of Exam: 05/29/2022 Medical Rec #:  [id number]         Height:       60.0 in Accession #:    [id number]        Weight:       88.2 lb Date of Birth:  02-25-1951         BSA:          1.318 m Patient Age:    70 years          BP:           143/66 mmHg Patient Gender: F                 HR:           113 bpm. [facility] Procedure: 2D Echo, Cardiac Doppler and Color Doppler Indications:    Elevated Troponin  History:        Patient has prior history of Echocardiogram examinations, most                 recent 04/14/2018. CAD and Previous Myocardial Infarction, COPD                 and Stroke; Risk Factors:Hypertension and Dyslipidemia. Hx of                 CKD, COVID-10.  Sonographer:     Celesta Gentile RCS Referring Phys: [phone] DAVID TAT  Sonographer Comments: ** Patient on BiPAP and moving Alot during echo. IMPRESSIONS  1. Left ventricular ejection fraction, by estimation, is 35 to 40%. The left ventricle has moderately decreased function. The left ventricle demonstrates regional wall motion abnormalities (  see scoring diagram/findings for description). Left ventricular  diastolic parameters are indeterminate.  2. Right ventricular systolic function is mildly reduced. The right ventricular size is normal. There is moderately elevated pulmonary artery systolic pressure. The estimated right ventricular systolic pressure is 42.6 mmHg.  3. The mitral valve is degenerative. Trivial mitral valve regurgitation.  4. The aortic valve was not well visualized. Aortic valve regurgitation is not visualized. No aortic stenosis is present.  5. The inferior vena cava is dilated in size with <50% respiratory variability, suggesting right atrial pressure of 15 mmHg. Conclusion(s)/Recommendation(s): Normal systolic function at base with apical akinesis, differential includes LAD infarct vs stress induced cardiomyopathy. FINDINGS  Left Ventricle: Left ventricular ejection fraction, by estimation, is 35 to 40%. The left ventricle has moderately decreased function. The left ventricle demonstrates regional wall motion abnormalities. The left ventricular internal cavity size was normal in size. There is no left ventricular hypertrophy. Left ventricular diastolic parameters are indeterminate.  LV Wall Scoring: The mid and distal anterior septum, entire apex, and mid inferoseptal segment are akinetic. The anterior wall, antero-lateral wall, inferior wall, posterior wall, basal anteroseptal segment, and basal inferoseptal segment are normal. Right Ventricle: The right ventricular size is normal. Right vetricular wall thickness was not well visualized. Right ventricular systolic function is mildly reduced. There is moderately  elevated pulmonary artery systolic pressure. The tricuspid regurgitant velocity is 3.00 m/s, and with an assumed right atrial pressure of 15 mmHg, the estimated right ventricular systolic pressure is 83.4 mmHg. Left Atrium: Left atrial size was normal in size. Right Atrium: Right atrial size was normal in size. Pericardium: There is no evidence of pericardial effusion. Mitral Valve: The mitral valve is degenerative in appearance. Trivial mitral valve regurgitation. Tricuspid Valve: The tricuspid valve is normal in structure. Tricuspid valve regurgitation is trivial. Aortic Valve: The aortic valve was not well visualized. Aortic valve regurgitation is not visualized. No aortic stenosis is present. Pulmonic Valve: The pulmonic valve was not well visualized. Pulmonic valve regurgitation is not visualized. Aorta: The aortic root is normal in size and structure. Venous: The inferior vena cava is dilated in size with less than 50% respiratory variability, suggesting right atrial pressure of 15 mmHg. IAS/Shunts: The interatrial septum was not well visualized.  LEFT VENTRICLE PLAX 2D LVIDd:         3.60 cm LVIDs:         2.50 cm LV PW:         0.90 cm LV IVS:        0.90 cm LVOT diam:     1.70 cm LV SV:         37 LV SV Index:   28 LVOT Area:     2.27 cm  LV Volumes (MOD) LV vol d, MOD A2C: 51.2 ml LV vol d, MOD A4C: 57.0 ml LV vol s, MOD A2C: 30.1 ml LV vol s, MOD A4C: 32.9 ml LV SV MOD A2C:     21.1 ml LV SV MOD A4C:     57.0 ml LV SV MOD BP:      24.6 ml RIGHT VENTRICLE TAPSE (M-mode): 1.2 cm LEFT ATRIUM             Index        RIGHT ATRIUM           Index LA diam:        2.60 cm 1.97 cm/m   RA Area:     10.40 cm LA Vol (A2C):  32.4 ml 24.58 ml/m  RA Volume:   25.30 ml  19.20 ml/m LA Vol (A4C):   32.5 ml 24.66 ml/m LA Biplane Vol: 35.2 ml 26.71 ml/m  AORTIC VALVE LVOT Vmax:   88.30 cm/s LVOT Vmean:  55.300 cm/s LVOT VTI:    0.164 m  AORTA Ao Root diam: 2.50 cm MITRAL VALVE                TRICUSPID VALVE MV Area  (PHT): 7.16 cm     TR Peak grad:   36.0 mmHg MV Decel Time: 106 msec     TR Vmax:        300.00 cm/s MV E velocity: 136.00 cm/s                             SHUNTS                             Systemic VTI:  0.16 m                             Systemic Diam: 1.70 cm Epifanio Lesches MD Electronically signed by Epifanio Lesches MD Signature Date/Time: 05/29/2022/2:52:39 PM    Final    CT Angio Chest Pulmonary Embolism (PE) W or WO Contrast  Result Date: 05/29/2022 CLINICAL DATA:  Shortness of breath x2 weeks EXAM: CT ANGIOGRAPHY CHEST WITH CONTRAST TECHNIQUE: Multidetector CT imaging of the chest was performed using the standard protocol during bolus administration of intravenous contrast. Multiplanar CT image reconstructions and MIPs were obtained to evaluate the vascular anatomy. RADIATION DOSE REDUCTION: This exam was performed according to the departmental dose-optimization program which includes automated exposure control, adjustment of the mA and/or kV according to patient size and/or use of iterative reconstruction technique. CONTRAST:  24mL OMNIPAQUE IOHEXOL 350 MG/ML SOLN COMPARISON:  Previous studies including the chest radiograph done on 05/28/2022 FINDINGS: Cardiovascular: There is homogeneous enhancement in thoracic aorta. There are no intraluminal filling defects in central pulmonary artery branches. There are scattered coronary artery calcifications. Mediastinum/Nodes: No significant lymphadenopathy seen. There are small pockets of air in right subclavian vein, possibly introduced during venipuncture. Lungs/Pleura: Centrilobular and panlobular emphysema is seen. 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 There is small patchy infiltrate in the lateral aspect of posterior segment of right upper lobe. There is thickening of interlobular septi in the lower lung fields. There are patchy infiltrates in right middle lobe and both lower lobes. Small right pleural effusion is seen. There is minimal  left pleural effusion. There is no pneumothorax. Upper Abdomen: There is 5.1 cm smooth marginated fluid density lesion in the upper pole of left kidney suggesting possible cyst. No follow-up is recommended. There are a few low-density lesions in liver measuring up to 11 mm in size which are not fully characterized, most likely cysts. No follow-up imaging is recommended. Musculoskeletal: No acute findings are seen. Review of the MIP images confirms the above findings. IMPRESSION: There is no evidence of pulmonary artery embolism. There is no evidence of thoracic aortic dissection. Scattered coronary artery calcifications are seen. 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 There are small patchy infiltrates in posterior segment of right upper lobe, right middle lobe and both lower lobes suggesting multifocal atelectasis/pneumonia. Bilateral pleural effusions, more so on the right side. There are low-density lesions in left  kidney and liver, possibly cysts. No follow-up imaging is recommended. Electronically Signed   By: Elmer Picker M.D.   On: 05/29/2022 13:04   DG Chest Portable 1 View  Result Date: 05/28/2022 CLINICAL DATA:  Shortness of breath for 2 weeks.  COVID positive. EXAM: PORTABLE CHEST 1 VIEW COMPARISON:  05/02/2022 FINDINGS: Lungs are hyperinflated. Significant bullous changes at the RIGHT lung apex, similar to prior. There are no focal consolidations or pleural effusions. No pulmonary edema. IMPRESSION: Hyperinflation of the lungs. No evidence for acute pulmonary abnormality. Electronically Signed   By: Nolon Nations M.D.   On: 05/28/2022 17:12    Barton Dubois, MD  [facility]  If 7PM-7AM, please contact night-coverage [email or web address] Password [facility] 06/05/2022, 6:55 PM   LOS: 8 days

## 2022-06-05 NOTE — TOC Initial Note (Signed)
Transition of Care [facility]) - Initial/Assessment Note    Patient Details  Name: [name] MRN: [id number] Date of Birth: 07/08/1951  Transition of Care [facility]) CM/SW Contact:    Annice Needy, LCSW Phone Number: 06/05/2022, 12:40 PM  Clinical Narrative:                 Patient from home alone. Independent at baseline. Has not driven in about a month as son has been transporting her. Son indicates that patient will discharge to his home and he will provide care for her.   Expected Discharge Plan: Home w Home Health Services Barriers to Discharge: Continued Medical Work up   Patient Goals and CMS Choice Patient states their goals for this hospitalization and ongoing recovery are:: return home      Expected Discharge Plan and Services Expected Discharge Plan: Home w Home Health Services       Living arrangements for the past 2 months: Single Family Home                                      Prior Living Arrangements/Services Living arrangements for the past 2 months: Single Family Home Lives with:: Self                   Activities of Daily Living Home Assistive Devices/Equipment: None ADL Screening (condition at time of admission) Patient's cognitive ability adequate to safely complete daily activities?: Yes Is the patient deaf or have difficulty hearing?: No Does the patient have difficulty seeing, even when wearing glasses/contacts?: Yes Does the patient have difficulty concentrating, remembering, or making decisions?: No Patient able to express need for assistance with ADLs?: Yes Does the patient have difficulty dressing or bathing?: No Independently performs ADLs?: Yes (appropriate for developmental age) Does the patient have difficulty walking or climbing stairs?: Yes Weakness of Legs: Both Weakness of Arms/Hands: None  Permission Sought/Granted Permission sought to share information with : Family Supports    Share Information with NAME:  Tish Men, son           Emotional Assessment     Affect (typically observed): Appropriate Orientation: : Oriented to Self Alcohol / Substance Use: Not Applicable Psych Involvement: No (comment)  Admission diagnosis:  Hypoxia [R09.02] Acute respiratory failure with hypoxia (HCC) [J96.01] Anemia, unspecified type [D64.9] Patient Active Problem List   Diagnosis Date Noted   Impaired glucose tolerance 06/02/2022   Hypernatremia 06/02/2022   Hypophosphatemia 06/01/2022   Hyperkalemia 05/31/2022   Malnutrition of moderate degree 05/30/2022   Cardiomyopathy (HCC) 05/30/2022   Lobar pneumonia (HCC) 05/30/2022   Elevated troponin 05/29/2022   Chronic kidney disease, stage 3a (HCC) 05/29/2022   COPD with acute exacerbation (HCC) 05/29/2022   Anemia    Gastroesophageal reflux disease    Nausea vomiting and diarrhea    Dysphagia    Pressure injury of skin 05/03/2022   COVID-19 virus infection 05/02/2022   AKI (acute kidney injury) (HCC) 05/02/2022   Acute respiratory failure with hypoxia and hypercarbia (HCC) 05/02/2022   Hypokalemia    Calculus of gallbladder with acute cholecystitis and obstruction    Choledocholithiasis 10/09/2021   Noninfectious gastroenteritis 07/16/2014   Constipation - functional 10/27/2012   Colon cancer screening 10/27/2012   History of stroke 10/26/2012   Chronic back pain 10/26/2012   HYPERLIPIDEMIA-MIXED 03/18/2009   Essential hypertension 03/18/2009   RENAL DISEASE 03/18/2009   PCP:  Benetta Spar, MD Pharmacy:   [facility] [phone] - [place] - [address] AT [facility] [address] [place] [address] Phone: [phone] Fax: [phone]     Social Determinants of Health (SDOH) Interventions    Readmission Risk Interventions     No data to display

## 2022-06-05 NOTE — Progress Notes (Signed)
Progress Note  Patient Name: [name] Date of Encounter: 06/05/2022  Primary Cardiologist: Little Ishikawa, MD   Subjective   Overnight WBC continues to increase despite methylpredisolone start of 05/29/22 (if solely steroid related would have expected this spike sooner). Heart rates have improved. Patient is still on precedex. Patient notes that she is cold.  She recounts her ASA allergy (whole body swelling) and her  No CP, SOB, Palpitations.  She denies chest pain as part of her index evaluation on exam today She is cold. She has not has PO yet.  Inpatient Medications    Scheduled Meds:  arformoterol  15 mcg Nebulization BID   budesonide (PULMICORT) nebulizer solution  0.5 mg Nebulization BID   Chlorhexidine Gluconate Cloth  6 each Topical Q0600   clopidogrel  75 mg Per Tube Daily   heparin  5,000 Units Subcutaneous Q8H   insulin aspart  0-9 Units Subcutaneous Q4H   methylPREDNISolone (SOLU-MEDROL) injection  40 mg Intravenous Q12H   metoprolol tartrate  7.5 mg Intravenous Q6H   mupirocin ointment   Nasal BID   mouth rinse  15 mL Mouth Rinse QID   pantoprazole (PROTONIX) IV  40 mg Intravenous Q12H   phosphorus  500 mg Per Tube BID   polyethylene glycol  17 g Per Tube Daily   revefenacin  175 mcg Nebulization Daily   sodium chloride flush  10-40 mL Intracatheter Q12H   Continuous Infusions:  azithromycin Stopped (06/04/22 1717)   aztreonam 1 g (06/05/22 0520)   dexmedetomidine (PRECEDEX) IV infusion 1 mcg/kg/hr (06/05/22 0014)   vancomycin Stopped (06/04/22 1621)   PRN Meds: acetaminophen **OR** acetaminophen, albuterol, fentaNYL, fentaNYL (SUBLIMAZE) injection, hydrALAZINE, midazolam, ondansetron **OR** ondansetron (ZOFRAN) IV, mouth rinse, polyethylene glycol, sodium chloride flush   Vital Signs    Vitals:   06/05/22 0400 06/05/22 0500 06/05/22 0751 06/05/22 0754  BP: 135/79     Pulse: 88     Resp: (!) 25     Temp:    98.6 F (37 C)   TempSrc:    Oral  SpO2: 99%  99%   Weight:  44.7 kg    Height:        Intake/Output Summary (Last 24 hours) at 06/05/2022 0820 Last data filed at 06/04/2022 2347 Gross per 24 hour  Intake 1375.66 ml  Output 100 ml  Net 1275.66 ml   Filed Weights   06/03/22 0505 06/04/22 0500 06/05/22 0500  Weight: 45.7 kg 43.8 kg 44.7 kg    Telemetry    Sinus tachycardia rare PVCs - Personally Reviewed  ECG    Sinus Tachycardia low voltage Ecg, Heart rate 141, TWI in lateral leads. - Personally Reviewed  Physical Exam   Gen: no distress, Thin, frail female   Neck: No JVD Cardiac: No Rubs or Gallops, no murmur, regular tachycardia, + radial pulses Respiratory: Faint crackles lower bases, wheezes bilaterally, no tachypnea on my exam ~ 8 30 AM. GI: Soft, nontender, non-distended  MS: No  edema;  moves all extremities Integument: Skin feels warm Neuro:  At time of evaluation, alert and oriented to person/place/time/situation  Psych: Normal affect   Labs    Chemistry Recent Labs  Lab 06/01/22 0424 06/02/22 0553 06/03/22 0436  NA 145 148* 146*  K 4.6 4.5 3.6  CL 119* 121* 118*  CO2 22 24 26   GLUCOSE 192* 199* 257*  BUN 45* 47* 44*  CREATININE 1.21* 1.02* 0.84  CALCIUM 8.3* 8.7* 8.7*  GFRNONAA 48* 58* >  60  ANIONGAP 4* 3* 2*     Hematology Recent Labs  Lab 06/03/22 0436 06/04/22 0519 06/05/22 0536  WBC 9.9 11.0* 16.0*  RBC 2.65* 2.92* 2.84*  HGB 9.1* 10.0* 9.9*  HCT 29.9* 32.8* 31.9*  MCV 112.8* 112.3* 112.3*  MCH 34.3* 34.2* 34.9*  MCHC 30.4 30.5 31.0  RDW 16.1* 16.3* 17.0*  PLT 207 204 219    Cardiac EnzymesNo results for input(s): "TROPONINI" in the last 168 hours. No results for input(s): "TROPIPOC" in the last 168 hours.   BNP Recent Labs  Lab 05/30/22 2002  BNP 1,779.0*     DDimer No results for input(s): "DDIMER" in the last 168 hours.   Radiology    [place] EKG SITE RITE  Result Date: 06/04/2022 If Site Rite image not attached, placement could not  be confirmed due to current cardiac rhythm.  [place] EKG SITE RITE  Result Date: 06/04/2022 If Site Rite image not attached, placement could not be confirmed due to current cardiac rhythm.  DG CHEST PORT 1 VIEW  Result Date: 06/03/2022 CLINICAL DATA:  NG tube placement.  Respiratory failure with hypoxia EXAM: PORTABLE CHEST 1 VIEW COMPARISON:  06/03/2022 FINDINGS: Endotracheal tube is 5.8 cm above the carina. NG tube enters the stomach. Heart is normal size. Increased markings in the lung bases likely reflects scarring. No acute confluent opacities or effusions. Emphysematous changes. No acute bony abnormality. IMPRESSION: COPD. Bibasilar scarring. Support devices as above. Electronically Signed   By: Charlett Nose M.D.   On: 06/03/2022 22:37     Patient Profile     71 y.o. female new cardiomyopathy   Assessment & Plan    History of CAD Chest pain - s/p 48 hours of heparin - planned for TRH-TRH transfer and LHC at [facility] once clinically stable (WBC stable, heart rate controlled, able to lie flat without issues, see 06/01/22 note for discussion with youngest of her three sons from Dr. Diona Browner) - planned for pre-treatment - on plavix (was per tube)  - has an ASA allergy - presently she is not amenable to [facility] (notes it is scary) but she is will to think about it;   SOB wit bilateral pleural effusions 05/29/22, elevated BNP, new HFrEF but with severe bullous COPD - complicated by AKI (resolved) and hypernatremia - complicated by recent COVID19 and PNA - complicated by ACE allergy - will increase IV metoprolol dose; when able to tolerate PO will transition to 5 mg bisoprolol given her COPD  HX of SVT on this admission per chart review PVCs - AV nodal agents as above    For questions or updates, please contact [facility] Please consult [email or web address] for contact info under Cardiology/STEMI.      Riley Lam, MD FASE Cardiologist [facility]  [address] [place], [address] [place], [place] [address] [phone]  8:20 AM

## 2022-06-05 NOTE — Care Management Important Message (Signed)
Important Message  Patient Details  Name: [name] MRN: [id number] Date of Birth: [birth year]   Medicare Important Message Given:  Yes     Corey Harold 06/05/2022, 2:17 PM

## 2022-06-06 DIAGNOSIS — I428 Other cardiomyopathies: Secondary | ICD-10-CM | POA: Diagnosis not present

## 2022-06-06 DIAGNOSIS — J9601 Acute respiratory failure with hypoxia: Secondary | ICD-10-CM | POA: Diagnosis not present

## 2022-06-06 DIAGNOSIS — I5021 Acute systolic (congestive) heart failure: Secondary | ICD-10-CM | POA: Diagnosis not present

## 2022-06-06 DIAGNOSIS — I10 Essential (primary) hypertension: Secondary | ICD-10-CM | POA: Diagnosis not present

## 2022-06-06 LAB — CBC
HCT: 29.4 % — ABNORMAL LOW (ref 36.0–46.0)
Hemoglobin: 9.4 g/dL — ABNORMAL LOW (ref 12.0–15.0)
MCH: 34.9 pg — ABNORMAL HIGH (ref 26.0–34.0)
MCHC: 32 g/dL (ref 30.0–36.0)
MCV: 109.3 fL — ABNORMAL HIGH (ref 80.0–100.0)
Platelets: 190 10*3/uL (ref 150–400)
RBC: 2.69 MIL/uL — ABNORMAL LOW (ref 3.87–5.11)
RDW: 16.7 % — ABNORMAL HIGH (ref 11.5–15.5)
WBC: 12.8 10*3/uL — ABNORMAL HIGH (ref 4.0–10.5)
nRBC: 0.3 % — ABNORMAL HIGH (ref 0.0–0.2)

## 2022-06-06 LAB — GLUCOSE, CAPILLARY
Glucose-Capillary: 122 mg/dL — ABNORMAL HIGH (ref 70–99)
Glucose-Capillary: 138 mg/dL — ABNORMAL HIGH (ref 70–99)
Glucose-Capillary: 142 mg/dL — ABNORMAL HIGH (ref 70–99)
Glucose-Capillary: 154 mg/dL — ABNORMAL HIGH (ref 70–99)
Glucose-Capillary: 91 mg/dL (ref 70–99)

## 2022-06-06 MED ORDER — VANCOMYCIN HCL 750 MG/150ML IV SOLN
750.0000 mg | INTRAVENOUS | Status: AC
  Administered 2022-06-06 – 2022-06-07 (×2): 750 mg via INTRAVENOUS
  Filled 2022-06-06 (×2): qty 150

## 2022-06-06 MED ORDER — BISOPROLOL FUMARATE 5 MG PO TABS
5.0000 mg | ORAL_TABLET | Freq: Every day | ORAL | Status: DC
  Administered 2022-06-06 – 2022-06-07 (×2): 5 mg via ORAL
  Filled 2022-06-06 (×2): qty 1

## 2022-06-06 MED ORDER — SODIUM CHLORIDE 0.9 % IV SOLN: INTRAVENOUS | Status: DC | PRN

## 2022-06-06 MED ORDER — DIPHENOXYLATE-ATROPINE 2.5-0.025 MG PO TABS
1.0000 | ORAL_TABLET | Freq: Four times a day (QID) | ORAL | Status: DC | PRN
  Administered 2022-06-06 (×2): 1 via ORAL
  Filled 2022-06-06 (×2): qty 1

## 2022-06-06 MED ORDER — METOPROLOL TARTRATE 5 MG/5ML IV SOLN
5.0000 mg | Freq: Four times a day (QID) | INTRAVENOUS | Status: DC | PRN
  Administered 2022-06-07 – 2022-06-13 (×4): 5 mg via INTRAVENOUS
  Filled 2022-06-06 (×4): qty 5

## 2022-06-06 MED ORDER — POLYVINYL ALCOHOL 1.4 % OP SOLN
2.0000 [drp] | OPHTHALMIC | Status: DC | PRN
  Administered 2022-06-06 – 2022-06-16 (×2): 2 [drp] via OPHTHALMIC
  Filled 2022-06-06: qty 15

## 2022-06-06 MED ORDER — SODIUM CHLORIDE 0.9 % IV SOLN
1.0000 g | Freq: Three times a day (TID) | INTRAVENOUS | Status: AC
  Administered 2022-06-06 – 2022-06-08 (×6): 1 g via INTRAVENOUS
  Filled 2022-06-06 (×7): qty 5

## 2022-06-06 NOTE — Plan of Care (Signed)
Patient transferring out of CCU to Med-Surg today.   Problem: Education: Goal: Knowledge of risk factors and measures for prevention of condition will improve Outcome: Progressing   Problem: Coping: Goal: Psychosocial and spiritual needs will be supported Outcome: Progressing   Problem: Respiratory: Goal: Will maintain a patent airway Outcome: Progressing Goal: Complications related to the disease process, condition or treatment will be avoided or minimized Outcome: Progressing   Problem: Education: Goal: Knowledge of General Education information will improve Description: Including pain rating scale, medication(s)/side effects and non-pharmacologic comfort measures Outcome: Progressing   Problem: Health Behavior/Discharge Planning: Goal: Ability to manage health-related needs will improve Outcome: Progressing   Problem: Clinical Measurements: Goal: Ability to maintain clinical measurements within normal limits will improve Outcome: Progressing Goal: Will remain free from infection Outcome: Progressing Goal: Diagnostic test results will improve Outcome: Progressing Goal: Respiratory complications will improve Outcome: Progressing Goal: Cardiovascular complication will be avoided Outcome: Progressing   Problem: Activity: Goal: Risk for activity intolerance will decrease Outcome: Progressing   Problem: Nutrition: Goal: Adequate nutrition will be maintained Outcome: Progressing   Problem: Coping: Goal: Level of anxiety will decrease Outcome: Progressing   Problem: Elimination: Goal: Will not experience complications related to bowel motility Outcome: Progressing Goal: Will not experience complications related to urinary retention Outcome: Progressing   Problem: Pain Managment: Goal: General experience of comfort will improve Outcome: Progressing   Problem: Safety: Goal: Ability to remain free from injury will improve Outcome: Progressing   Problem: Skin  Integrity: Goal: Risk for impaired skin integrity will decrease Outcome: Progressing   Problem: Activity: Goal: Ability to tolerate increased activity will improve Outcome: Progressing   Problem: Respiratory: Goal: Ability to maintain a clear airway and adequate ventilation will improve Outcome: Progressing   Problem: Role Relationship: Goal: Method of communication will improve Outcome: Progressing   Problem: Safety: Goal: Non-violent Restraint(s) Outcome: Progressing   Problem: Education: Goal: Ability to describe self-care measures that may prevent or decrease complications (Diabetes Survival Skills Education) will improve Outcome: Progressing Goal: Individualized Educational Video(s) Outcome: Progressing   Problem: Coping: Goal: Ability to adjust to condition or change in health will improve Outcome: Progressing   Problem: Fluid Volume: Goal: Ability to maintain a balanced intake and output will improve Outcome: Progressing   Problem: Health Behavior/Discharge Planning: Goal: Ability to identify and utilize available resources and services will improve Outcome: Progressing Goal: Ability to manage health-related needs will improve Outcome: Progressing   Problem: Metabolic: Goal: Ability to maintain appropriate glucose levels will improve Outcome: Progressing   Problem: Nutritional: Goal: Maintenance of adequate nutrition will improve Outcome: Progressing Goal: Progress toward achieving an optimal weight will improve Outcome: Progressing   Problem: Skin Integrity: Goal: Risk for impaired skin integrity will decrease Outcome: Progressing   Problem: Tissue Perfusion: Goal: Adequacy of tissue perfusion will improve Outcome: Progressing

## 2022-06-06 NOTE — Progress Notes (Signed)
PROGRESS NOTE  [name] [id number] DOB: December 15, 1950 DOA: 05/28/2022 PCP: Carrolyn Meiers, MD  Brief History:  71 year old female with a history of coronary disease, hypertension, hyperlipidemia, functional constipation, B12 and folate deficiency, anxiety, and prior stroke presenting with shortness of breath and respiratory failure.  The patient was recently mid to the hospital from 05/02/2022 to 05/09/2022 when she had acute respiratory failure secondary to COVID-19.  During that hospitalization, the patient was also noted to have colitis for which she was treated with levofloxacin and metronidazole.  She completed a course of antibiotics prior to discharge.  She was discharged home with a prednisone taper. She states that she has had some shortness of breath since discharge from the hospital, but this has significantly worsened in the 24 hours prior to admission.  Upon arrival to the emergency department, the patient was noted to have respiratory distress patient was placed on BiPAP. Repeat ABG on BiPAP did not show significant improvement.  There was plans for intubation, but the patient was given Ativan with improvement clinically.  She remained on BiPAP overnight.  Notably, the patient states that she started having some burning chest discomfort on the morning of 05/28/2022.  She states that it somewhat improved while she was on BiPAP, but continues to have the chest discomfort.  She was taken off of BiPAP in the morning of 05/29/2022 and stated that she had some worsening of her burning chest discomfort.  She developed some increased shortness of breath resulting in placement back on BiPAP on the morning of 05/29/22.  Troponins were noted to be 375>> 478.  D-dimer was also noted to be elevated 3.97.  The patient was started on IV heparin. The patient was given Solu-Medrol 125 mg IV, morphine 1 mg IV and placed back on BiPAP.  Repeat EKG showed sinus tachycardia with T wave  inversion V2-V5.  Troponins continue to be cycled. cardiology was consulted to assist with management.   On the evening of 05/30/2022, the patient developed respiratory distress and hypoxia on BiPAP.  She remained agitated despite pharmacologic and nonpharmacologic interventions.  After discussion with the patient's family, decision was made to intubate the patient.  Repeat limited echocardiogram was ordered for 05/31/22.     Assessment and Plan: * Acute respiratory failure with hypoxia and hypercarbia (HCC) - Presented with respiratory distress with oxygen saturation 77% on room air.  Appears to be secondary to COPD exacerbation and pneumonia -Patient failed the use of BiPAP and in the requiring to be intubated and mechanically ventilated.  (05/30/2022) -CTA chest--neg PE, severe emphysema; multifocal patchy infiltrates RUL, RLL, LLL -Patient has been extubated (06/04/2022);  -Currently on 1-2 L nasal cannula supplementation and reporting no significant shortness of breath -Continue current bronchodilator management -Start the use of incentive spirometer and flutter valve. -Continue steroids tapering process; 1 more day of IV antibiotics. -Appreciate pulmonology assistance.    COPD with acute exacerbation ([place]) -Although the patient denies a history of COPD and has not had formal PFTs--> she endorses smoking over 30 years, 1/2 pack/day. -Quit smoking 7 years ago -Continue Yupelri/Brovana -Continue IV Solu-Medrol; statin tapering off process -Continue Pulmicort and pulmonary toiletry.   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 -EKG--sinus rhythm, T wave depression V2-V5 -9/8 echo--EF 35-40%, apical HK, trivial MR/TR -9/10--repeat limited Echo-EF 35-40%, unchanged -9/11 started on Plavix after finalization of heparin drip. -  Allergy planning the possibility for cardiac  catheterization once medically stable stable and able to lay down flat.  Lobar pneumonia (HCC) -CTA chest as discussed above -MRSA+ -Continue vanc/aztreonam/azithro -PCT 0.64 -Tracheal aspiration without any growth. -Continue supportive care.  Hypernatremia - Continue to maintain adequate amount of free water through tube feedings -Follow electrolytes.  Impaired glucose tolerance -05/07/22 A1C--5.4 -Continue sliding scale insulin -Elevated CBGs in the setting of steroids usage and tube feedings.  Hypophosphatemia - Continue to follow electrolytes and replete as needed.  Hyperkalemia -Given lokelma -Electrolytes stable currently -Continue telemetry monitoring and follow electrolytes trend.  Cardiomyopathy (HCC) -9/8 echo--EF 35-40%, apical HK, trivial MR/TR -Discussed with cardiology>>suspect Takatsubo's  -finish 48 hours IV heparin 9/11>> patient has been started on Plavix.   Repeat limited Echo EF 35-40%, unchanged  Malnutrition of moderate degree -Advance diet -Count calories intake -Follow response.  Anemia -No signs of active blood loss -Hgb stable since admission -baseslin Hgb 10-11 -B12--674 -Folate 35.6 -Iron sat 37, ferritin 7392 -Continue to follow hemoglobin trend.  Chronic kidney disease, stage 3a (HCC) -Baseline creatinine 0.8-1.1 -Continue to follow renal function trend/stability.   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 -Continue risk factor modification. -No new focal deficits.  Essential hypertension -Continue metoprolol -Given IV currently. -Per cardiology service planning to use bisoprolol when fully able to take p.o.'s.  Diarrhea: -Most likely functional in the setting of tube feedings and antibiotics -WBCs trending down and no fever -There is no blood appreciated in the stools -Start Florastor twice a day -Finishing antibiotics in the next  24 hours -Maintain adequate hydration -Follow clinical response   Family Communication: No family at bedside for updates.   Consultants:  pulm, cardiology   Code Status:  FULL    DVT Prophylaxis:  [place] Heparin      Procedures: As Listed in Progress Note Above   Antibiotics: Vanc 9/8>> Aztreonam 9/8>> Azithro 9/8>>     Subjective: No fever, no chest pain, no nausea, no vomiting.  Continue demonstrating further improvement in her breathing and is now down to 1-2 L nasal cannula supplementation.  Patient reports intermittent episode of diarrhea.  Objective: Vitals:   06/06/22 0740 06/06/22 0800 06/06/22 0900 06/06/22 1000  BP:  (!) 155/78  119/60  Pulse: 66 78 95 79  Resp: (!) 25 20 (!) 22 (!) 28  Temp:      TempSrc:      SpO2: 100% 100% 100% 98%  Weight:      Height:        Intake/Output Summary (Last 24 hours) at 06/06/2022 1025 Last data filed at 06/06/2022 1008 Gross per 24 hour  Intake 1127.77 ml  Output 1250 ml  Net -122.23 ml   Weight change:   Exam: General exam: Alert, awake, oriented x 3; no chest pain, no nausea, no vomiting.  Continue reporting improvement in her breathing and demonstrating good mentation.  Patient is afebrile. Respiratory system: Improved air movement bilaterally; positive scattered rhonchi; no wheezing or crackles on exam.  No using accessory muscles. Cardiovascular system:Rate controlled currently; no rubs, no gallops, no JVD  Gastrointestinal system: Abdomen is nondistended, soft and nontender. No organomegaly or masses felt. Normal bowel sounds heard. Central nervous system: Alert and oriented. No focal neurological deficits. Extremities: No cyanosis or clubbing. Skin: No petechiae. Psychiatry: Judgement and insight appear normal. Mood & affect appropriate.   Data Reviewed: I have personally reviewed following labs and imaging  studies  Basic Metabolic Panel: Recent Labs  Lab 05/31/22 0419 05/31/22 0645 06/01/22 0424  06/02/22 0553 06/03/22 0436  NA 145 145 145 148* 146*  K 5.6* 5.2* 4.6 4.5 3.6  CL 117* 117* 119* 121* 118*  CO2 22 21* 22 24 26   GLUCOSE 137* 142* 192* 199* 257*  BUN 29* 31* 45* 47* 44*  CREATININE 1.34* 1.40* 1.21* 1.02* 0.84  CALCIUM 8.6* 8.4* 8.3* 8.7* 8.7*  MG 1.8  --  1.9 2.0 1.9  PHOS  --   --  2.2*  --  2.9   CBC: Recent Labs  Lab 06/02/22 0553 06/03/22 0436 06/04/22 0519 06/05/22 0536 06/06/22 0410  WBC 9.3 9.9 11.0* 16.0* 12.8*  HGB 9.5* 9.1* 10.0* 9.9* 9.4*  HCT 32.1* 29.9* 32.8* 31.9* 29.4*  MCV 118.0* 112.8* 112.3* 112.3* 109.3*  PLT 198 207 204 219 190   CBG: Recent Labs  Lab 06/05/22 0751 06/05/22 1133 06/05/22 1618 06/05/22 1917 06/06/22 0747  GLUCAP 144* 145* 136* 174* 122*   HbA1C: No results for input(s): "HGBA1C" in the last 72 hours.  Urine analysis:    Component Value Date/Time   COLORURINE YELLOW 06/02/2022 1637   APPEARANCEUR CLEAR 06/02/2022 1637   LABSPEC 1.016 06/02/2022 1637   PHURINE 5.0 06/02/2022 1637   GLUCOSEU NEGATIVE 06/02/2022 1637   HGBUR NEGATIVE 06/02/2022 1637   BILIRUBINUR NEGATIVE 06/02/2022 1637   KETONESUR NEGATIVE 06/02/2022 1637   PROTEINUR NEGATIVE 06/02/2022 1637   UROBILINOGEN 1.0 07/16/2014 0244   NITRITE NEGATIVE 06/02/2022 1637   LEUKOCYTESUR NEGATIVE 06/02/2022 1637   Sepsis Labs:  Recent Results (from the past 240 hour(s))  Resp Panel by RT-PCR (Flu A&B, Covid) Anterior Nasal Swab     Status: None   Collection Time: 05/28/22  4:27 PM   Specimen: Anterior Nasal Swab  Result Value Ref Range Status   SARS Coronavirus 2 by RT PCR NEGATIVE NEGATIVE Final    Comment: (NOTE) SARS-CoV-2 target nucleic acids are NOT DETECTED.  The SARS-CoV-2 RNA is generally detectable in upper respiratory specimens during the acute phase of infection. The lowest concentration of SARS-CoV-2 viral copies this assay can detect is 138 copies/mL. A negative result does not preclude SARS-Cov-2 infection and should not be  used as the sole basis for treatment or other patient management decisions. A negative result may occur with  improper specimen collection/handling, submission of specimen other than nasopharyngeal swab, presence of viral mutation(s) within the areas targeted by this assay, and inadequate number of viral copies(<138 copies/mL). A negative result must be combined with clinical observations, patient history, and epidemiological information. The expected result is Negative.  Fact Sheet for Patients:  EntrepreneurPulse.com.au  Fact Sheet for Healthcare Providers:  IncredibleEmployment.be  This test is no t yet approved or cleared by the [place] FDA and  has been authorized for detection and/or diagnosis of SARS-CoV-2 by FDA under an Emergency Use Authorization (EUA). This EUA will remain  in effect (meaning this test can be used) for the duration of the COVID-19 declaration under Section 564(b)(1) of the Act, 21 U.S.C.section 360bbb-3(b)(1), unless the authorization is terminated  or revoked sooner.       Influenza A by PCR NEGATIVE NEGATIVE Final   Influenza B by PCR NEGATIVE NEGATIVE Final    Comment: (NOTE) The Xpert Xpress SARS-CoV-2/FLU/RSV plus assay is intended as an aid in the diagnosis of influenza from Nasopharyngeal swab specimens and should not be used as a sole basis for treatment. Nasal washings and aspirates are  unacceptable for Xpert Xpress SARS-CoV-2/FLU/RSV testing.  Fact Sheet for Patients: EntrepreneurPulse.com.au  Fact Sheet for Healthcare Providers: IncredibleEmployment.be  This test is not yet approved or cleared by the [place] FDA and has been authorized for detection and/or diagnosis of SARS-CoV-2 by FDA under an Emergency Use Authorization (EUA). This EUA will remain in effect (meaning this test can be used) for the duration of the COVID-19 declaration under Section  564(b)(1) of the Act, 21 U.S.C. section 360bbb-3(b)(1), unless the authorization is terminated or revoked.  Performed at [facility], [address], [place], [place] [address]   MRSA Next Gen by PCR, Nasal     Status: Abnormal   Collection Time: 05/29/22  2:27 AM   Specimen: Nasal Mucosa; Nasal Swab  Result Value Ref Range Status   MRSA by PCR Next Gen DETECTED (A) NOT DETECTED Final    Comment: RESULT CALLED TO, READ BACK BY AND VERIFIED WITH: MILLS M @ 1106 ON OP:[id number] BY HENDERSON L (NOTE) The GeneXpert MRSA Assay (FDA approved for NASAL specimens only), is one component of a comprehensive MRSA colonization surveillance program. It is not intended to diagnose MRSA infection nor to guide or monitor treatment for MRSA infections. Test performance is not FDA approved in patients less than 33 years old. Performed at [facility], [address] [place], [place] [address]   Culture, Respiratory w Gram Stain     Status: None   Collection Time: 05/31/22  8:00 AM   Specimen: Tracheal Aspirate; Respiratory  Result Value Ref Range Status   Specimen Description   Final    TRACHEAL ASPIRATE Performed at [facility], [address], [place], [place] [address]    Special Requests   Final    NONE Performed at [facility], [address] [place], [place] [address]    Gram Stain NO WBC SEEN NO ORGANISMS SEEN   Final   Culture   Final    NO GROWTH Performed at [facility], [place] [address], [place], [place] [address]    Report Status 06/02/2022 FINAL  Final  Urine Culture     Status: None   Collection Time: 06/02/22  4:37 PM   Specimen: Urine, Catheterized  Result Value Ref Range Status   Specimen Description   Final    URINE, CATHETERIZED Performed at [facility], [address], [place], [place] [address]    Special Requests   Final    NONE Performed at [facility], [address] [place], [place] [address]    Culture   Final    NO GROWTH Performed at [facility], [place] [address] [place], [place] [address]    Report Status 06/03/2022 FINAL  Final     Scheduled Meds:  arformoterol  15 mcg Nebulization BID   bisoprolol  5 mg Oral Daily   budesonide (PULMICORT) nebulizer solution  0.5 mg Nebulization BID   Chlorhexidine Gluconate Cloth  6 each Topical Q0600   citalopram  20 mg Oral Daily   clopidogrel  75 mg Oral Daily   feeding supplement  237 mL Oral TID BM   heparin  5,000 Units Subcutaneous Q8H   insulin aspart  0-9 Units Subcutaneous Q4H   methylPREDNISolone (SOLU-MEDROL) injection  40 mg Intravenous Daily   mirtazapine  7.5 mg Oral QHS   mupirocin ointment   Nasal BID   pantoprazole (PROTONIX) IV  40 mg Intravenous Q12H   phosphorus  500 mg Oral BID   polyethylene glycol  17 g Oral Daily   revefenacin  175 mcg Nebulization Daily   saccharomyces boulardii  250 mg Oral BID   sodium chloride flush  10-40 mL Intracatheter Q12H   Continuous Infusions:  aztreonam     dexmedetomidine (PRECEDEX) IV infusion 0.8 mcg/kg/hr (06/06/22 1008)   vancomycin      Procedures/Studies: [place] EKG SITE RITE  Result Date: 06/04/2022 If Site Rite image not attached, placement could not be confirmed due to current cardiac rhythm.  [place] EKG SITE RITE  Result Date: 06/04/2022 If Site Rite image not attached, placement could not be confirmed due to current cardiac rhythm.  DG CHEST PORT 1 VIEW  Result Date: 06/03/2022 CLINICAL DATA:  NG tube placement.  Respiratory failure with hypoxia EXAM: PORTABLE CHEST 1 VIEW COMPARISON:  06/03/2022 FINDINGS: Endotracheal tube is 5.8 cm above the carina. NG tube enters the stomach. Heart is normal size. Increased markings in the lung bases likely reflects scarring. No acute confluent opacities or effusions. Emphysematous changes. No acute bony abnormality. IMPRESSION: COPD. Bibasilar scarring. Support devices as above. Electronically Signed   By: Rolm Baptise M.D.   On: 06/03/2022 22:37   DG Chest Port 1  View  Result Date: 06/03/2022 CLINICAL DATA:  Acute respiratory failure with hypoxia EXAM: PORTABLE CHEST 1 VIEW COMPARISON:  06/01/2022 FINDINGS: Endotracheal and enteric tubes are again identified. Enteric tube has likely retracted some given location of side port. 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 Similar left greater than right interstitial changes. No significant pleural effusion. No pneumothorax. Stable cardiomediastinal contours. IMPRESSION: Similar left greater than right patchy opacities superimposed on emphysema. Enteric tube has likely retracted as side port is now visible just below the gastroesophageal junction. Electronically Signed   By: Macy Mis M.D.   On: 06/03/2022 08:19   DG CHEST PORT 1 VIEW  Result Date: 06/01/2022 CLINICAL DATA:  Pneumonia EXAM: PORTABLE CHEST 1 VIEW COMPARISON:  Previous studies including the examination of 05/31/2022 FINDINGS: Tip of endotracheal tube is 7 cm above the carina. Enteric tube is noted traversing the esophagus. Cardiac size is within normal limits. There is slight improvement in infiltrates in left lung. There is possible slight worsening of infiltrate in right parahilar region. Bullous emphysema is seen in right upper lung field limiting evaluation for small pneumothorax. There is no significant pleural effusion. No definite pneumothorax is seen. IMPRESSION: There is decrease in extensive interstitial infiltrates in left lung. There is slight worsening of infiltrate in right parahilar region. Severe bullous emphysema in the right upper lung field. Electronically Signed   By: Elmer Picker M.D.   On: 06/01/2022 09:17   ECHOCARDIOGRAM LIMITED  Result Date: 05/31/2022    ECHOCARDIOGRAM LIMITED REPORT   Patient Name:   MARRIANNA LIDE Date of Exam: 05/31/2022 Medical Rec #:  [id number]         Height:       60.0 in Accession #:    [id number]        Weight:       90.2 lb Date of Birth:  Dec 06, 1950         BSA:          1.330 m Patient  Age:    6 years          BP:           125/55 mmHg Patient Gender: F                 HR:  101 bpm. Exam Location:  [facility] Procedure: Limited Echo Indications:    CHF-Acute Systolic [id number]  History:        Patient has prior history of Echocardiogram examinations, most                 recent 05/29/2022. Previous Myocardial Infarction and CAD, COPD                 and Stroke; Risk Factors:Hypertension and Dyslipidemia. Hx of                 CKD, COVID-10.  Sonographer:    Alvino Chapel RCS Referring Phys: [phone] DAVID TAT  Sonographer Comments: *Patient on mechanical ventilator at present. IMPRESSIONS  1. Left ventricular ejection fraction, by estimation, is 35 to 40%. The left ventricle has moderately decreased function. The left ventricle demonstrates regional wall motion abnormalities (see scoring diagram/findings for description). The mid septal and all apical LV segments are akinetic (consistent with prior TTE).  2. Right ventricular systolic function is mildly reduced. The right ventricular size is normal.  3. The mitral valve is degenerative. Moderate mitral annular calcification. Comparison(s): Compared to prior TTE on 05/29/22, there is no significant change. FINDINGS  Left Ventricle: The mid septal and all apical LV segments are akinetic. Left ventricular ejection fraction, by estimation, is 35 to 40%. The left ventricle has moderately decreased function. The left ventricle demonstrates regional wall motion abnormalities. The left ventricular internal cavity size was normal in size. There is no left ventricular hypertrophy. Right Ventricle: The right ventricular size is normal. Right ventricular systolic function is mildly reduced. Pericardium: There is no evidence of pericardial effusion. Mitral Valve: The mitral valve is degenerative in appearance. There is mild thickening of the mitral valve leaflet(s). There is mild calcification of the mitral valve leaflet(s). Moderate mitral annular  calcification. Aorta: The aortic root is normal in size and structure. LEFT VENTRICLE PLAX 2D LVIDd:         3.90 cm LVIDs:         3.30 cm LV PW:         0.90 cm LV IVS:        0.90 cm LVOT diam:     1.70 cm LVOT Area:     2.27 cm  LV Volumes (MOD) LV vol d, MOD A2C: 38.7 ml LV vol d, MOD A4C: 50.3 ml LV vol s, MOD A2C: 21.4 ml LV vol s, MOD A4C: 26.8 ml LV SV MOD A2C:     17.3 ml LV SV MOD A4C:     50.3 ml LV SV MOD BP:      20.7 ml LEFT ATRIUM         Index LA diam:    3.20 cm 2.41 cm/m   AORTA Ao Root diam: 2.50 cm  SHUNTS Systemic Diam: 1.70 cm Gwyndolyn Kaufman MD Electronically signed by Gwyndolyn Kaufman MD Signature Date/Time: 05/31/2022/1:31:58 PM    Final    DG Chest 1 View  Result Date: 05/31/2022 CLINICAL DATA:  Shortness of breath EXAM: CHEST  1 VIEW COMPARISON:  05/30/2022 FINDINGS: Endotracheal tube terminates 7 cm above the carina. Multifocal patchy left lung opacities, favoring pneumonia, possibly on the basis of aspiration. Right lung is essentially clear. No pleural effusion or pneumothorax. The heart is normal in size. Enteric tube courses into the stomach. Cholecystectomy clips. IMPRESSION: Endotracheal tube terminates 7 cm above the carina. Multifocal patchy left lung opacities, favoring pneumonia, possibly on the basis of aspiration. Electronically  Signed   By: Charline Bills M.D.   On: 05/31/2022 03:54   DG CHEST PORT 1 VIEW  Result Date: 05/30/2022 CLINICAL DATA:  Endotracheal and orogastric tube. EXAM: PORTABLE CHEST 1 VIEW COMPARISON:  Chest CT 05/29/2022.  Chest x-ray 05/30/2009 3. FINDINGS: Endotracheal tube tip is 5 cm above the carina. Enteric tube tip is in the mid stomach. There are increasing central interstitial and airspace opacities throughout the left lung. Severe emphysematous changes are again seen in the right upper lobe. There is no pleural effusion or pneumothorax. No acute fractures are seen. Cardiomediastinal silhouette is within normal limits. IMPRESSION: 1.  Endotracheal tube tip 5 cm above carina. 2. Enteric tube tip at the level of the mid stomach. 3. Increasing left lung interstitial and airspace opacities which may represent edema or infection. 4. Stable severe emphysema. Electronically Signed   By: Darliss Cheney M.D.   On: 05/30/2022 19:50   DG CHEST PORT 1 VIEW  Result Date: 05/30/2022 CLINICAL DATA:  Respiratory failure, pneumonia EXAM: PORTABLE CHEST 1 VIEW COMPARISON:  Previous studies including chest radiograph done on 05/28/2022 and CT done on 05/29/2022 FINDINGS: There is diffuse increase in patchy interstitial and alveolar densities in both lungs, more so on the left side. There is relative sparing of right upper lung field due to underlying bullous emphysema. There is blunting of both lateral CP angles. There is no pneumothorax. IMPRESSION: There is diffuse increase in interstitial and alveolar markings in both lungs, more so on the left side. Findings suggest asymmetric pulmonary edema or worsening multifocal bilateral pneumonia. Small bilateral pleural effusions. Electronically Signed   By: Ernie Avena M.D.   On: 05/30/2022 10:05   ECHOCARDIOGRAM COMPLETE  Result Date: 05/29/2022    ECHOCARDIOGRAM REPORT   Patient Name:   ALONDRA VANDEVEN Date of Exam: 05/29/2022 Medical Rec #:  [id number]         Height:       60.0 in Accession #:    [id number]        Weight:       88.2 lb Date of Birth:  02-25-1951         BSA:          1.318 m Patient Age:    70 years          BP:           143/66 mmHg Patient Gender: F                 HR:           113 bpm. [facility] Procedure: 2D Echo, Cardiac Doppler and Color Doppler Indications:    Elevated Troponin  History:        Patient has prior history of Echocardiogram examinations, most                 recent 04/14/2018. CAD and Previous Myocardial Infarction, COPD                 and Stroke; Risk Factors:Hypertension and Dyslipidemia. Hx of                 CKD, COVID-10.  Sonographer:     Celesta Gentile RCS Referring Phys: [phone] DAVID TAT  Sonographer Comments: ** Patient on BiPAP and moving Alot during echo. IMPRESSIONS  1. Left ventricular ejection fraction, by estimation, is 35 to 40%. The left ventricle has moderately decreased function. The left ventricle demonstrates regional wall motion abnormalities (  see scoring diagram/findings for description). Left ventricular  diastolic parameters are indeterminate.  2. Right ventricular systolic function is mildly reduced. The right ventricular size is normal. There is moderately elevated pulmonary artery systolic pressure. The estimated right ventricular systolic pressure is 42.6 mmHg.  3. The mitral valve is degenerative. Trivial mitral valve regurgitation.  4. The aortic valve was not well visualized. Aortic valve regurgitation is not visualized. No aortic stenosis is present.  5. The inferior vena cava is dilated in size with <50% respiratory variability, suggesting right atrial pressure of 15 mmHg. Conclusion(s)/Recommendation(s): Normal systolic function at base with apical akinesis, differential includes LAD infarct vs stress induced cardiomyopathy. FINDINGS  Left Ventricle: Left ventricular ejection fraction, by estimation, is 35 to 40%. The left ventricle has moderately decreased function. The left ventricle demonstrates regional wall motion abnormalities. The left ventricular internal cavity size was normal in size. There is no left ventricular hypertrophy. Left ventricular diastolic parameters are indeterminate.  LV Wall Scoring: The mid and distal anterior septum, entire apex, and mid inferoseptal segment are akinetic. The anterior wall, antero-lateral wall, inferior wall, posterior wall, basal anteroseptal segment, and basal inferoseptal segment are normal. Right Ventricle: The right ventricular size is normal. Right vetricular wall thickness was not well visualized. Right ventricular systolic function is mildly reduced. There is moderately  elevated pulmonary artery systolic pressure. The tricuspid regurgitant velocity is 3.00 m/s, and with an assumed right atrial pressure of 15 mmHg, the estimated right ventricular systolic pressure is 83.4 mmHg. Left Atrium: Left atrial size was normal in size. Right Atrium: Right atrial size was normal in size. Pericardium: There is no evidence of pericardial effusion. Mitral Valve: The mitral valve is degenerative in appearance. Trivial mitral valve regurgitation. Tricuspid Valve: The tricuspid valve is normal in structure. Tricuspid valve regurgitation is trivial. Aortic Valve: The aortic valve was not well visualized. Aortic valve regurgitation is not visualized. No aortic stenosis is present. Pulmonic Valve: The pulmonic valve was not well visualized. Pulmonic valve regurgitation is not visualized. Aorta: The aortic root is normal in size and structure. Venous: The inferior vena cava is dilated in size with less than 50% respiratory variability, suggesting right atrial pressure of 15 mmHg. IAS/Shunts: The interatrial septum was not well visualized.  LEFT VENTRICLE PLAX 2D LVIDd:         3.60 cm LVIDs:         2.50 cm LV PW:         0.90 cm LV IVS:        0.90 cm LVOT diam:     1.70 cm LV SV:         37 LV SV Index:   28 LVOT Area:     2.27 cm  LV Volumes (MOD) LV vol d, MOD A2C: 51.2 ml LV vol d, MOD A4C: 57.0 ml LV vol s, MOD A2C: 30.1 ml LV vol s, MOD A4C: 32.9 ml LV SV MOD A2C:     21.1 ml LV SV MOD A4C:     57.0 ml LV SV MOD BP:      24.6 ml RIGHT VENTRICLE TAPSE (M-mode): 1.2 cm LEFT ATRIUM             Index        RIGHT ATRIUM           Index LA diam:        2.60 cm 1.97 cm/m   RA Area:     10.40 cm LA Vol (A2C):  32.4 ml 24.58 ml/m  RA Volume:   25.30 ml  19.20 ml/m LA Vol (A4C):   32.5 ml 24.66 ml/m LA Biplane Vol: 35.2 ml 26.71 ml/m  AORTIC VALVE LVOT Vmax:   88.30 cm/s LVOT Vmean:  55.300 cm/s LVOT VTI:    0.164 m  AORTA Ao Root diam: 2.50 cm MITRAL VALVE                TRICUSPID VALVE MV Area  (PHT): 7.16 cm     TR Peak grad:   36.0 mmHg MV Decel Time: 106 msec     TR Vmax:        300.00 cm/s MV E velocity: 136.00 cm/s                             SHUNTS                             Systemic VTI:  0.16 m                             Systemic Diam: 1.70 cm Epifanio Lesches MD Electronically signed by Epifanio Lesches MD Signature Date/Time: 05/29/2022/2:52:39 PM    Final    CT Angio Chest Pulmonary Embolism (PE) W or WO Contrast  Result Date: 05/29/2022 CLINICAL DATA:  Shortness of breath x2 weeks EXAM: CT ANGIOGRAPHY CHEST WITH CONTRAST TECHNIQUE: Multidetector CT imaging of the chest was performed using the standard protocol during bolus administration of intravenous contrast. Multiplanar CT image reconstructions and MIPs were obtained to evaluate the vascular anatomy. RADIATION DOSE REDUCTION: This exam was performed according to the departmental dose-optimization program which includes automated exposure control, adjustment of the mA and/or kV according to patient size and/or use of iterative reconstruction technique. CONTRAST:  24mL OMNIPAQUE IOHEXOL 350 MG/ML SOLN COMPARISON:  Previous studies including the chest radiograph done on 05/28/2022 FINDINGS: Cardiovascular: There is homogeneous enhancement in thoracic aorta. There are no intraluminal filling defects in central pulmonary artery branches. There are scattered coronary artery calcifications. Mediastinum/Nodes: No significant lymphadenopathy seen. There are small pockets of air in right subclavian vein, possibly introduced during venipuncture. Lungs/Pleura: Centrilobular and panlobular emphysema is seen. 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 There is small patchy infiltrate in the lateral aspect of posterior segment of right upper lobe. There is thickening of interlobular septi in the lower lung fields. There are patchy infiltrates in right middle lobe and both lower lobes. Small right pleural effusion is seen. There is minimal  left pleural effusion. There is no pneumothorax. Upper Abdomen: There is 5.1 cm smooth marginated fluid density lesion in the upper pole of left kidney suggesting possible cyst. No follow-up is recommended. There are a few low-density lesions in liver measuring up to 11 mm in size which are not fully characterized, most likely cysts. No follow-up imaging is recommended. Musculoskeletal: No acute findings are seen. Review of the MIP images confirms the above findings. IMPRESSION: There is no evidence of pulmonary artery embolism. There is no evidence of thoracic aortic dissection. Scattered coronary artery calcifications are seen. 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 There are small patchy infiltrates in posterior segment of right upper lobe, right middle lobe and both lower lobes suggesting multifocal atelectasis/pneumonia. Bilateral pleural effusions, more so on the right side. There are low-density lesions in left  kidney and liver, possibly cysts. No follow-up imaging is recommended. Electronically Signed   By: Elmer Picker M.D.   On: 05/29/2022 13:04   DG Chest Portable 1 View  Result Date: 05/28/2022 CLINICAL DATA:  Shortness of breath for 2 weeks.  COVID positive. EXAM: PORTABLE CHEST 1 VIEW COMPARISON:  05/02/2022 FINDINGS: Lungs are hyperinflated. Significant bullous changes at the RIGHT lung apex, similar to prior. There are no focal consolidations or pleural effusions. No pulmonary edema. IMPRESSION: Hyperinflation of the lungs. No evidence for acute pulmonary abnormality. Electronically Signed   By: Nolon Nations M.D.   On: 05/28/2022 17:12    Barton Dubois, MD  [facility]  If 7PM-7AM, please contact night-coverage [email or web address] Password [email or web address] 06/06/2022, 10:25 AM   LOS: 9 days

## 2022-06-07 ENCOUNTER — Inpatient Hospital Stay (HOSPITAL_COMMUNITY): Payer: Medicare Other

## 2022-06-07 DIAGNOSIS — I428 Other cardiomyopathies: Secondary | ICD-10-CM | POA: Diagnosis not present

## 2022-06-07 DIAGNOSIS — J9601 Acute respiratory failure with hypoxia: Secondary | ICD-10-CM | POA: Diagnosis not present

## 2022-06-07 DIAGNOSIS — I10 Essential (primary) hypertension: Secondary | ICD-10-CM | POA: Diagnosis not present

## 2022-06-07 DIAGNOSIS — I5021 Acute systolic (congestive) heart failure: Secondary | ICD-10-CM | POA: Diagnosis not present

## 2022-06-07 LAB — GLUCOSE, CAPILLARY
Glucose-Capillary: 103 mg/dL — ABNORMAL HIGH (ref 70–99)
Glucose-Capillary: 133 mg/dL — ABNORMAL HIGH (ref 70–99)
Glucose-Capillary: 73 mg/dL (ref 70–99)
Glucose-Capillary: 81 mg/dL (ref 70–99)

## 2022-06-07 LAB — BASIC METABOLIC PANEL
Anion gap: 8 (ref 5–15)
BUN: 22 mg/dL (ref 8–23)
CO2: 26 mmol/L (ref 22–32)
Calcium: 8.8 mg/dL — ABNORMAL LOW (ref 8.9–10.3)
Chloride: 112 mmol/L — ABNORMAL HIGH (ref 98–111)
Creatinine, Ser: 0.82 mg/dL (ref 0.44–1.00)
GFR, Estimated: >60 mL/min (ref >60)
Glucose, Bld: 97 mg/dL (ref 70–99)
Potassium: 3.3 mmol/L — ABNORMAL LOW (ref 3.5–5.1)
Sodium: 146 mmol/L — ABNORMAL HIGH (ref 135–145)

## 2022-06-07 LAB — CBC
HCT: 36.8 % (ref 36.0–46.0)
Hemoglobin: 11.6 g/dL — ABNORMAL LOW (ref 12.0–15.0)
MCH: 34.8 pg — ABNORMAL HIGH (ref 26.0–34.0)
MCHC: 31.5 g/dL (ref 30.0–36.0)
MCV: 110.5 fL — ABNORMAL HIGH (ref 80.0–100.0)
Platelets: 212 10*3/uL (ref 150–400)
RBC: 3.33 MIL/uL — ABNORMAL LOW (ref 3.87–5.11)
RDW: 16.7 % — ABNORMAL HIGH (ref 11.5–15.5)
WBC: 19.8 10*3/uL — ABNORMAL HIGH (ref 4.0–10.5)
nRBC: 0.1 % (ref 0.0–0.2)

## 2022-06-07 LAB — MAGNESIUM: Magnesium: 2 mg/dL (ref 1.7–2.4)

## 2022-06-07 MED ORDER — HALOPERIDOL LACTATE 5 MG/ML IJ SOLN
1.0000 mg | Freq: Four times a day (QID) | INTRAMUSCULAR | Status: DC | PRN
  Administered 2022-06-07: 1 mg via INTRAVENOUS
  Filled 2022-06-07 (×2): qty 1

## 2022-06-07 MED ORDER — POTASSIUM CHLORIDE CRYS ER 20 MEQ PO TBCR: 40.0000 meq | EXTENDED_RELEASE_TABLET | Freq: Once | ORAL | Status: DC

## 2022-06-07 NOTE — Progress Notes (Signed)
PROGRESS NOTE  [name] [id number] DOB: 1951-04-13 DOA: 05/28/2022 PCP: Carrolyn Meiers, MD  Brief History:  71 year old female with a history of coronary disease, hypertension, hyperlipidemia, functional constipation, B12 and folate deficiency, anxiety, and prior stroke presenting with shortness of breath and respiratory failure.  The patient was recently mid to the hospital from 05/02/2022 to 05/09/2022 when she had acute respiratory failure secondary to COVID-19.  During that hospitalization, the patient was also noted to have colitis for which she was treated with levofloxacin and metronidazole.  She completed a course of antibiotics prior to discharge.  She was discharged home with a prednisone taper. She states that she has had some shortness of breath since discharge from the hospital, but this has significantly worsened in the 24 hours prior to admission.  Upon arrival to the emergency department, the patient was noted to have respiratory distress patient was placed on BiPAP. Repeat ABG on BiPAP did not show significant improvement.  There was plans for intubation, but the patient was given Ativan with improvement clinically.  She remained on BiPAP overnight.  Notably, the patient states that she started having some burning chest discomfort on the morning of 05/28/2022.  She states that it somewhat improved while she was on BiPAP, but continues to have the chest discomfort.  She was taken off of BiPAP in the morning of 05/29/2022 and stated that she had some worsening of her burning chest discomfort.  She developed some increased shortness of breath resulting in placement back on BiPAP on the morning of 05/29/22.  Troponins were noted to be 375>> 478.  D-dimer was also noted to be elevated 3.97.  The patient was started on IV heparin. The patient was given Solu-Medrol 125 mg IV, morphine 1 mg IV and placed back on BiPAP.  Repeat EKG showed sinus tachycardia with T wave  inversion V2-V5.  Troponins continue to be cycled. cardiology was consulted to assist with management.   On the evening of 05/30/2022, the patient developed respiratory distress and hypoxia on BiPAP.  She remained agitated despite pharmacologic and nonpharmacologic interventions.  After discussion with the patient's family, decision was made to intubate the patient.  Repeat limited echocardiogram was ordered for 05/31/22.     Assessment and Plan: * Acute respiratory failure with hypoxia and hypercarbia (HCC) - Presented with respiratory distress with oxygen saturation 77% on room air.  Appears to be secondary to COPD exacerbation and pneumonia -Patient failed the use of BiPAP and in the requiring to be intubated and mechanically ventilated.  (05/30/2022) -CTA chest--neg PE, severe emphysema; multifocal patchy infiltrates RUL, RLL, LLL -Patient has been extubated (06/04/2022);  -Currently on 1-2 L nasal cannula supplementation and reporting no significant shortness of breath -Continue current bronchodilator management and follow clinical response. -Start the use of incentive spirometer and flutter valve. -Continue steroids tapering process; has completed antibiotic therapy on 06/07/2022. -Appreciate pulmonology assistance.    COPD with acute exacerbation ([place]) -Although the patient denies a history of COPD and has not had formal PFTs--> she endorses smoking over 30 years, 1/2 pack/day. -Quit smoking 7 years ago -Continue Yupelri/Brovana -Continue IV Solu-Medrol; statin tapering off process -Continue Pulmicort and pulmonary toiletry.   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 -EKG--sinus rhythm, T wave depression V2-V5 -9/8 echo--EF 35-40%, apical HK, trivial MR/TR -9/10--repeat limited Echo-EF 35-40%, unchanged -9/11 started on Plavix after  finalization of heparin  drip. -Allergy planning the possibility for cardiac catheterization once medically stable stable and able to lay down flat.  Lobar pneumonia ([place]) -CTA chest as discussed above -MRSA+ -Continue vanc/aztreonam/azithro -PCT 0.64 -Tracheal aspiration without any growth. -Continue supportive care.  Hypernatremia - Continue to maintain adequate amount of free water through tube feedings -Follow electrolytes.  Impaired glucose tolerance -05/07/22 A1C--5.4 -Continue sliding scale insulin -Elevated CBGs in the setting of steroids usage and tube feedings.  Hypophosphatemia - Continue to follow electrolytes and replete as needed.  Hyperkalemia -Given lokelma -Electrolytes stable currently -Continue telemetry monitoring and follow electrolytes trend.  Cardiomyopathy ([place]) -9/8 echo--EF 35-40%, apical HK, trivial MR/TR -Discussed with cardiology>>suspect Takatsubo's  -finish 48 hours IV heparin 9/11>> patient has been started on Plavix.   Repeat limited Echo EF 35-40%, unchanged  Malnutrition of moderate degree -Advance diet -Count calories intake -Follow response.  Anemia -No signs of active blood loss -Hgb stable since admission -baseslin Hgb 10-11 -B12--674 -Folate 35.6 -Iron sat 37, ferritin 7392 -Continue to follow hemoglobin trend.  Chronic kidney disease, stage 3a ([place]) -Baseline creatinine 0.8-1.1 -Continue to follow renal function trend/stability.   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 -Continue risk factor modification. -No new focal deficits.  Essential hypertension -Continue metoprolol -Given IV currently. -Per cardiology service planning to use bisoprolol when fully able to take p.o.'s.  Diarrhea: -Most likely functional in the setting of tube feedings and antibiotics. -Continue supportive care -Continue the use of Florastor and maintain adequate  hydration.  Family Communication: No family at bedside for updates.   Consultants:  pulm, cardiology   Code Status:  FULL    DVT Prophylaxis:  [place] Heparin      Procedures: As Listed in Progress Note Above   Antibiotics: Vanc 9/8>> Aztreonam 9/8>> Azithro 9/8>>     Subjective: No fever, no chest pain, no palpitations, no nausea vomiting.  1-2 L second supplementation in place.  Experiencing intermittent episode of agitation/restlessness.  Objective: Vitals:   06/07/22 0900 06/07/22 0910 06/07/22 1100 06/07/22 1540  BP: 114/79 95/68 101/68 (!) 177/84  Pulse: (!) 128 (!) 103 98 92  Resp:    (!) 24  Temp:    98.8 F (37.1 C)  TempSrc:    Oral  SpO2:    100%  Weight:      Height:        Intake/Output Summary (Last 24 hours) at 06/07/2022 1609 Last data filed at 06/07/2022 1500 Gross per 24 hour  Intake 272.51 ml  Output 200 ml  Net 72.51 ml   Weight change:   Exam: General exam: Alert, awake, oriented x 2; with episode of agitation and restlessness earlier this morning.  No chest pain, no palpitations, using 1-2 L nasal cannula supplementation. Respiratory system: Positive scattered rhonchi, no using accessory muscles. Cardiovascular system: Rate controlled; no rubs, no gallops, no JVD.  Irregular breathing appreciated. Gastrointestinal system: Abdomen is nondistended, soft and nontender. No organomegaly or masses felt. Normal bowel sounds heard. Central nervous system: Alert and oriented. No focal neurological deficits. Extremities: No cyanosis or clubbing. Skin: No petechiae. Psychiatry: Flat affect.  Data Reviewed: I have personally reviewed following labs and imaging studies  Basic Metabolic Panel: Recent Labs  Lab 06/01/22 0424 06/02/22 0553 06/03/22 0436 06/07/22 0603  NA 145 148* 146* 146*  K 4.6 4.5 3.6 3.3*  CL 119* 121* 118* 112*  CO2 22 24 26  26  GLUCOSE 192* 199* 257* 97  BUN 45* 47* 44* 22  CREATININE 1.21* 1.02* 0.84 0.82  CALCIUM 8.3*  8.7* 8.7* 8.8*  MG 1.9 2.0 1.9 2.0  PHOS 2.2*  --  2.9  --    CBC: Recent Labs  Lab 06/03/22 0436 06/04/22 0519 06/05/22 0536 06/06/22 0410 06/07/22 0658  WBC 9.9 11.0* 16.0* 12.8* 19.8*  HGB 9.1* 10.0* 9.9* 9.4* 11.6*  HCT 29.9* 32.8* 31.9* 29.4* 36.8  MCV 112.8* 112.3* 112.3* 109.3* 110.5*  PLT 207 204 219 190 212   CBG: Recent Labs  Lab 06/06/22 1545 06/06/22 1944 06/06/22 2351 06/07/22 0455 06/07/22 1157  GLUCAP 154* 142* 91 103* 133*   HbA1C: No results for input(s): "HGBA1C" in the last 72 hours.  Urine analysis:    Component Value Date/Time   COLORURINE YELLOW 06/02/2022 1637   APPEARANCEUR CLEAR 06/02/2022 1637   LABSPEC 1.016 06/02/2022 1637   PHURINE 5.0 06/02/2022 1637   GLUCOSEU NEGATIVE 06/02/2022 1637   HGBUR NEGATIVE 06/02/2022 1637   BILIRUBINUR NEGATIVE 06/02/2022 1637   KETONESUR NEGATIVE 06/02/2022 1637   PROTEINUR NEGATIVE 06/02/2022 1637   UROBILINOGEN 1.0 07/16/2014 0244   NITRITE NEGATIVE 06/02/2022 1637   LEUKOCYTESUR NEGATIVE 06/02/2022 1637   Sepsis Labs:  Recent Results (from the past 240 hour(s))  Resp Panel by RT-PCR (Flu A&B, Covid) Anterior Nasal Swab     Status: None   Collection Time: 05/28/22  4:27 PM   Specimen: Anterior Nasal Swab  Result Value Ref Range Status   SARS Coronavirus 2 by RT PCR NEGATIVE NEGATIVE Final    Comment: (NOTE) SARS-CoV-2 target nucleic acids are NOT DETECTED.  The SARS-CoV-2 RNA is generally detectable in upper respiratory specimens during the acute phase of infection. The lowest concentration of SARS-CoV-2 viral copies this assay can detect is 138 copies/mL. A negative result does not preclude SARS-Cov-2 infection and should not be used as the sole basis for treatment or other patient management decisions. A negative result may occur with  improper specimen collection/handling, submission of specimen other than nasopharyngeal swab, presence of viral mutation(s) within the areas targeted by  this assay, and inadequate number of viral copies(<138 copies/mL). A negative result must be combined with clinical observations, patient history, and epidemiological information. The expected result is Negative.  Fact Sheet for Patients:  EntrepreneurPulse.com.au  Fact Sheet for Healthcare Providers:  IncredibleEmployment.be  This test is no t yet approved or cleared by the [place] FDA and  has been authorized for detection and/or diagnosis of SARS-CoV-2 by FDA under an Emergency Use Authorization (EUA). This EUA will remain  in effect (meaning this test can be used) for the duration of the COVID-19 declaration under Section 564(b)(1) of the Act, 21 U.S.C.section 360bbb-3(b)(1), unless the authorization is terminated  or revoked sooner.       Influenza A by PCR NEGATIVE NEGATIVE Final   Influenza B by PCR NEGATIVE NEGATIVE Final    Comment: (NOTE) The Xpert Xpress SARS-CoV-2/FLU/RSV plus assay is intended as an aid in the diagnosis of influenza from Nasopharyngeal swab specimens and should not be used as a sole basis for treatment. Nasal washings and aspirates are unacceptable for Xpert Xpress SARS-CoV-2/FLU/RSV testing.  Fact Sheet for Patients: EntrepreneurPulse.com.au  Fact Sheet for Healthcare Providers: IncredibleEmployment.be  This test is not yet approved or cleared by the [place] FDA and has been authorized for detection and/or diagnosis of SARS-CoV-2 by FDA under an Emergency Use Authorization (EUA). This EUA will remain in effect (meaning  this test can be used) for the duration of the COVID-19 declaration under Section 564(b)(1) of the Act, 21 U.S.C. section 360bbb-3(b)(1), unless the authorization is terminated or revoked.  Performed at [facility], [address], [place], [place] [address]   MRSA Next Gen by PCR, Nasal     Status: Abnormal   Collection Time: 05/29/22  2:27 AM    Specimen: Nasal Mucosa; Nasal Swab  Result Value Ref Range Status   MRSA by PCR Next Gen DETECTED (A) NOT DETECTED Final    Comment: RESULT CALLED TO, READ BACK BY AND VERIFIED WITH: MILLS M @ 1106 ON OP:[id number] BY HENDERSON L (NOTE) The GeneXpert MRSA Assay (FDA approved for NASAL specimens only), is one component of a comprehensive MRSA colonization surveillance program. It is not intended to diagnose MRSA infection nor to guide or monitor treatment for MRSA infections. Test performance is not FDA approved in patients less than 42 years old. Performed at [facility], [address] [place], [place] [address]   Culture, Respiratory w Gram Stain     Status: None   Collection Time: 05/31/22  8:00 AM   Specimen: Tracheal Aspirate; Respiratory  Result Value Ref Range Status   Specimen Description   Final    TRACHEAL ASPIRATE Performed at [facility], [address] [place], [place] [address]    Special Requests   Final    NONE Performed at [facility], [address], [place], [place] [address]    Gram Stain NO WBC SEEN NO ORGANISMS SEEN   Final   Culture   Final    NO GROWTH Performed at [facility], [place] [address], [place], [place] [address]    Report Status 06/02/2022 FINAL  Final  Urine Culture     Status: None   Collection Time: 06/02/22  4:37 PM   Specimen: Urine, Catheterized  Result Value Ref Range Status   Specimen Description   Final    URINE, CATHETERIZED Performed at [facility], [address] [place], [place] [address]    Special Requests   Final    NONE Performed at [facility], [address], [place], [place] [address]    Culture   Final    NO GROWTH Performed at [facility], [place] [address] [place], [place] [address]    Report Status 06/03/2022 FINAL  Final     Scheduled Meds:  arformoterol  15 mcg Nebulization BID   bisoprolol  5 mg Oral Daily   budesonide (PULMICORT) nebulizer solution  0.5 mg Nebulization BID   Chlorhexidine  Gluconate Cloth  6 each Topical Q0600   citalopram  20 mg Oral Daily   clopidogrel  75 mg Oral Daily   feeding supplement  237 mL Oral TID BM   heparin  5,000 Units Subcutaneous Q8H   insulin aspart  0-9 Units Subcutaneous Q4H   methylPREDNISolone (SOLU-MEDROL) injection  40 mg Intravenous Daily   mirtazapine  7.5 mg Oral QHS   mupirocin ointment   Nasal BID   pantoprazole (PROTONIX) IV  40 mg Intravenous Q12H   phosphorus  500 mg Oral BID   polyethylene glycol  17 g Oral Daily   potassium chloride  40 mEq Oral Once   revefenacin  175 mcg Nebulization Daily   saccharomyces boulardii  250 mg Oral BID   sodium chloride flush  10-40 mL Intracatheter Q12H   Continuous Infusions:  sodium chloride Stopped (06/06/22 2139)   aztreonam 1 g (06/07/22 1441)  vancomycin 750 mg (06/07/22 1527)    Procedures/Studies: [place] EKG SITE RITE  Result Date: 06/04/2022 If Site Rite image not attached, placement could not be confirmed due to current cardiac rhythm.  [place] EKG SITE RITE  Result Date: 06/04/2022 If Site Rite image not attached, placement could not be confirmed due to current cardiac rhythm.  DG CHEST PORT 1 VIEW  Result Date: 06/03/2022 CLINICAL DATA:  NG tube placement.  Respiratory failure with hypoxia EXAM: PORTABLE CHEST 1 VIEW COMPARISON:  06/03/2022 FINDINGS: Endotracheal tube is 5.8 cm above the carina. NG tube enters the stomach. Heart is normal size. Increased markings in the lung bases likely reflects scarring. No acute confluent opacities or effusions. Emphysematous changes. No acute bony abnormality. IMPRESSION: COPD. Bibasilar scarring. Support devices as above. Electronically Signed   By: Rolm Baptise M.D.   On: 06/03/2022 22:37   DG Chest Port 1 View  Result Date: 06/03/2022 CLINICAL DATA:  Acute respiratory failure with hypoxia EXAM: PORTABLE CHEST 1 VIEW COMPARISON:  06/01/2022 FINDINGS: Endotracheal and enteric tubes are again identified. Enteric tube has likely retracted  some given location of side port. 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 Similar left greater than right interstitial changes. No significant pleural effusion. No pneumothorax. Stable cardiomediastinal contours. IMPRESSION: Similar left greater than right patchy opacities superimposed on emphysema. Enteric tube has likely retracted as side port is now visible just below the gastroesophageal junction. Electronically Signed   By: Macy Mis M.D.   On: 06/03/2022 08:19   DG CHEST PORT 1 VIEW  Result Date: 06/01/2022 CLINICAL DATA:  Pneumonia EXAM: PORTABLE CHEST 1 VIEW COMPARISON:  Previous studies including the examination of 05/31/2022 FINDINGS: Tip of endotracheal tube is 7 cm above the carina. Enteric tube is noted traversing the esophagus. Cardiac size is within normal limits. There is slight improvement in infiltrates in left lung. There is possible slight worsening of infiltrate in right parahilar region. Bullous emphysema is seen in right upper lung field limiting evaluation for small pneumothorax. There is no significant pleural effusion. No definite pneumothorax is seen. IMPRESSION: There is decrease in extensive interstitial infiltrates in left lung. There is slight worsening of infiltrate in right parahilar region. Severe bullous emphysema in the right upper lung field. Electronically Signed   By: Elmer Picker M.D.   On: 06/01/2022 09:17   ECHOCARDIOGRAM LIMITED  Result Date: 05/31/2022    ECHOCARDIOGRAM LIMITED REPORT   [name] Date of Exam: 05/31/2022 Medical Rec #:  [id number]         Height:       60.0 in Accession #:    [id number]        Weight:       90.2 lb Date of Birth:  Jan 09, 1951         BSA:          1.330 m Patient Age:    48 years          BP:           125/55 mmHg Patient Gender: F                 HR:           101 bpm. Exam Location:  [facility] Procedure: Limited Echo Indications:    CHF-Acute Systolic [id number]  History:        Patient has prior  history of Echocardiogram examinations, most  recent 05/29/2022. Previous Myocardial Infarction and CAD, COPD                 and Stroke; Risk Factors:Hypertension and Dyslipidemia. Hx of                 CKD, COVID-10.  Sonographer:    Alvino Chapel RCS Referring Phys: [phone] DAVID TAT  Sonographer Comments: *Patient on mechanical ventilator at present. IMPRESSIONS  1. Left ventricular ejection fraction, by estimation, is 35 to 40%. The left ventricle has moderately decreased function. The left ventricle demonstrates regional wall motion abnormalities (see scoring diagram/findings for description). The mid septal and all apical LV segments are akinetic (consistent with prior TTE).  2. Right ventricular systolic function is mildly reduced. The right ventricular size is normal.  3. The mitral valve is degenerative. Moderate mitral annular calcification. Comparison(s): Compared to prior TTE on 05/29/22, there is no significant change. FINDINGS  Left Ventricle: The mid septal and all apical LV segments are akinetic. Left ventricular ejection fraction, by estimation, is 35 to 40%. The left ventricle has moderately decreased function. The left ventricle demonstrates regional wall motion abnormalities. The left ventricular internal cavity size was normal in size. There is no left ventricular hypertrophy. Right Ventricle: The right ventricular size is normal. Right ventricular systolic function is mildly reduced. Pericardium: There is no evidence of pericardial effusion. Mitral Valve: The mitral valve is degenerative in appearance. There is mild thickening of the mitral valve leaflet(s). There is mild calcification of the mitral valve leaflet(s). Moderate mitral annular calcification. Aorta: The aortic root is normal in size and structure. LEFT VENTRICLE PLAX 2D LVIDd:         3.90 cm LVIDs:         3.30 cm LV PW:         0.90 cm LV IVS:        0.90 cm LVOT diam:     1.70 cm LVOT Area:     2.27 cm  LV Volumes  (MOD) LV vol d, MOD A2C: 38.7 ml LV vol d, MOD A4C: 50.3 ml LV vol s, MOD A2C: 21.4 ml LV vol s, MOD A4C: 26.8 ml LV SV MOD A2C:     17.3 ml LV SV MOD A4C:     50.3 ml LV SV MOD BP:      20.7 ml LEFT ATRIUM         Index LA diam:    3.20 cm 2.41 cm/m   AORTA Ao Root diam: 2.50 cm  SHUNTS Systemic Diam: 1.70 cm Gwyndolyn Kaufman MD Electronically signed by Gwyndolyn Kaufman MD Signature Date/Time: 05/31/2022/1:31:58 PM    Final    DG Chest 1 View  Result Date: 05/31/2022 CLINICAL DATA:  Shortness of breath EXAM: CHEST  1 VIEW COMPARISON:  05/30/2022 FINDINGS: Endotracheal tube terminates 7 cm above the carina. Multifocal patchy left lung opacities, favoring pneumonia, possibly on the basis of aspiration. Right lung is essentially clear. No pleural effusion or pneumothorax. The heart is normal in size. Enteric tube courses into the stomach. Cholecystectomy clips. IMPRESSION: Endotracheal tube terminates 7 cm above the carina. Multifocal patchy left lung opacities, favoring pneumonia, possibly on the basis of aspiration. Electronically Signed   By: Julian Hy M.D.   On: 05/31/2022 03:54   DG CHEST PORT 1 VIEW  Result Date: 05/30/2022 CLINICAL DATA:  Endotracheal and orogastric tube. EXAM: PORTABLE CHEST 1 VIEW COMPARISON:  Chest CT 05/29/2022.  Chest x-ray 05/30/2009 3. FINDINGS: Endotracheal tube tip  is 5 cm above the carina. Enteric tube tip is in the mid stomach. There are increasing central interstitial and airspace opacities throughout the left lung. Severe emphysematous changes are again seen in the right upper lobe. There is no pleural effusion or pneumothorax. No acute fractures are seen. Cardiomediastinal silhouette is within normal limits. IMPRESSION: 1. Endotracheal tube tip 5 cm above carina. 2. Enteric tube tip at the level of the mid stomach. 3. Increasing left lung interstitial and airspace opacities which may represent edema or infection. 4. Stable severe emphysema. Electronically Signed    By: Ronney Asters M.D.   On: 05/30/2022 19:50   DG CHEST PORT 1 VIEW  Result Date: 05/30/2022 CLINICAL DATA:  Respiratory failure, pneumonia EXAM: PORTABLE CHEST 1 VIEW COMPARISON:  Previous studies including chest radiograph done on 05/28/2022 and CT done on 05/29/2022 FINDINGS: There is diffuse increase in patchy interstitial and alveolar densities in both lungs, more so on the left side. There is relative sparing of right upper lung field due to underlying bullous emphysema. There is blunting of both lateral CP angles. There is no pneumothorax. IMPRESSION: There is diffuse increase in interstitial and alveolar markings in both lungs, more so on the left side. Findings suggest asymmetric pulmonary edema or worsening multifocal bilateral pneumonia. Small bilateral pleural effusions. Electronically Signed   By: Elmer Picker M.D.   On: 05/30/2022 10:05   ECHOCARDIOGRAM COMPLETE  Result Date: 05/29/2022    ECHOCARDIOGRAM REPORT   [name] Date of Exam: 05/29/2022 Medical Rec #:  [id number]         Height:       60.0 in Accession #:    [id number]        Weight:       88.2 lb Date of Birth:  [birth year]         BSA:          1.318 m Patient Age:    49 years          BP:           143/66 mmHg Patient Gender: F                 HR:           113 bpm. Exam Location:  [facility] Procedure: 2D Echo, Cardiac Doppler and Color Doppler Indications:    Elevated Troponin  History:        Patient has prior history of Echocardiogram examinations, most                 recent 04/14/2018. CAD and Previous Myocardial Infarction, COPD                 and Stroke; Risk Factors:Hypertension and Dyslipidemia. Hx of                 CKD, COVID-10.  Sonographer:    Alvino Chapel RCS Referring Phys: [phone] DAVID TAT  Sonographer Comments: ** Patient on BiPAP and moving Alot during echo. IMPRESSIONS  1. Left ventricular ejection fraction, by estimation, is 35 to 40%. The left ventricle has moderately decreased  function. The left ventricle demonstrates regional wall motion abnormalities (see scoring diagram/findings for description). Left ventricular  diastolic parameters are indeterminate.  2. Right ventricular systolic function is mildly reduced. The right ventricular size is normal. There is moderately elevated pulmonary artery systolic pressure. The estimated right ventricular systolic pressure is [id number] mmHg.  3. The mitral valve is  degenerative. Trivial mitral valve regurgitation.  4. The aortic valve was not well visualized. Aortic valve regurgitation is not visualized. No aortic stenosis is present.  5. The inferior vena cava is dilated in size with <50% respiratory variability, suggesting right atrial pressure of 15 mmHg. Conclusion(s)/Recommendation(s): Normal systolic function at base with apical akinesis, differential includes LAD infarct vs stress induced cardiomyopathy. FINDINGS  Left Ventricle: Left ventricular ejection fraction, by estimation, is 35 to 40%. The left ventricle has moderately decreased function. The left ventricle demonstrates regional wall motion abnormalities. The left ventricular internal cavity size was normal in size. There is no left ventricular hypertrophy. Left ventricular diastolic parameters are indeterminate.  LV Wall Scoring: The mid and distal anterior septum, entire apex, and mid inferoseptal segment are akinetic. The anterior wall, antero-lateral wall, inferior wall, posterior wall, basal anteroseptal segment, and basal inferoseptal segment are normal. Right Ventricle: The right ventricular size is normal. Right vetricular wall thickness was not well visualized. Right ventricular systolic function is mildly reduced. There is moderately elevated pulmonary artery systolic pressure. The tricuspid regurgitant velocity is 3.00 m/s, and with an assumed right atrial pressure of 15 mmHg, the estimated right ventricular systolic pressure is [id number] mmHg. Left Atrium: Left atrial size was  normal in size. Right Atrium: Right atrial size was normal in size. Pericardium: There is no evidence of pericardial effusion. Mitral Valve: The mitral valve is degenerative in appearance. Trivial mitral valve regurgitation. Tricuspid Valve: The tricuspid valve is normal in structure. Tricuspid valve regurgitation is trivial. Aortic Valve: The aortic valve was not well visualized. Aortic valve regurgitation is not visualized. No aortic stenosis is present. Pulmonic Valve: The pulmonic valve was not well visualized. Pulmonic valve regurgitation is not visualized. Aorta: The aortic root is normal in size and structure. Venous: The inferior vena cava is dilated in size with less than 50% respiratory variability, suggesting right atrial pressure of 15 mmHg. IAS/Shunts: The interatrial septum was not well visualized.  LEFT VENTRICLE PLAX 2D LVIDd:         3.60 cm LVIDs:         2.50 cm LV PW:         0.90 cm LV IVS:        0.90 cm LVOT diam:     1.70 cm LV SV:         37 LV SV Index:   28 LVOT Area:     2.27 cm  LV Volumes (MOD) LV vol d, MOD A2C: 51.2 ml LV vol d, MOD A4C: 57.0 ml LV vol s, MOD A2C: 30.1 ml LV vol s, MOD A4C: 32.9 ml LV SV MOD A2C:     21.1 ml LV SV MOD A4C:     57.0 ml LV SV MOD BP:      24.6 ml RIGHT VENTRICLE TAPSE (M-mode): 1.2 cm LEFT ATRIUM             Index        RIGHT ATRIUM           Index LA diam:        2.60 cm 1.97 cm/m   RA Area:     10.40 cm LA Vol (A2C):   32.4 ml 24.58 ml/m  RA Volume:   25.30 ml  19.20 ml/m LA Vol (A4C):   32.5 ml 24.66 ml/m LA Biplane Vol: 35.2 ml 26.71 ml/m  AORTIC VALVE LVOT Vmax:   88.30 cm/s LVOT Vmean:  55.300 cm/s LVOT VTI:  0.164 m  AORTA Ao Root diam: 2.50 cm MITRAL VALVE                TRICUSPID VALVE MV Area (PHT): 7.16 cm     TR Peak grad:   36.0 mmHg MV Decel Time: 106 msec     TR Vmax:        300.00 cm/s MV E velocity: 136.00 cm/s                             SHUNTS                             Systemic VTI:  0.16 m                              Systemic Diam: 1.70 cm Oswaldo Milian MD Electronically signed by Oswaldo Milian MD Signature Date/Time: 05/29/2022/2:52:39 PM    Final    CT Angio Chest Pulmonary Embolism (PE) W or WO Contrast  Result Date: 05/29/2022 CLINICAL DATA:  Shortness of breath x2 weeks EXAM: CT ANGIOGRAPHY CHEST WITH CONTRAST TECHNIQUE: Multidetector CT imaging of the chest was performed using the standard protocol during bolus administration of intravenous contrast. Multiplanar CT image reconstructions and MIPs were obtained to evaluate the vascular anatomy. RADIATION DOSE REDUCTION: This exam was performed according to the departmental dose-optimization program which includes automated exposure control, adjustment of the mA and/or kV according to patient size and/or use of iterative reconstruction technique. CONTRAST:  44mL OMNIPAQUE IOHEXOL 350 MG/ML SOLN COMPARISON:  Previous studies including the chest radiograph done on 05/28/2022 FINDINGS: Cardiovascular: There is homogeneous enhancement in thoracic aorta. There are no intraluminal filling defects in central pulmonary artery branches. There are scattered coronary artery calcifications. Mediastinum/Nodes: No significant lymphadenopathy seen. There are small pockets of air in right subclavian vein, possibly introduced during venipuncture. Lungs/Pleura: Centrilobular and panlobular emphysema is seen. 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 There is small patchy infiltrate in the lateral aspect of posterior segment of right upper lobe. There is thickening of interlobular septi in the lower lung fields. There are patchy infiltrates in right middle lobe and both lower lobes. Small right pleural effusion is seen. There is minimal left pleural effusion. There is no pneumothorax. Upper Abdomen: There is 5.1 cm smooth marginated fluid density lesion in the upper pole of left kidney suggesting possible cyst. No follow-up is recommended. There are a few low-density  lesions in liver measuring up to 11 mm in size which are not fully characterized, most likely cysts. No follow-up imaging is recommended. Musculoskeletal: No acute findings are seen. Review of the MIP images confirms the above findings. IMPRESSION: There is no evidence of pulmonary artery embolism. There is no evidence of thoracic aortic dissection. Scattered coronary artery calcifications are seen. 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 There are small patchy infiltrates in posterior segment of right upper lobe, right middle lobe and both lower lobes suggesting multifocal atelectasis/pneumonia. Bilateral pleural effusions, more so on the right side. There are low-density lesions in left kidney and liver, possibly cysts. No follow-up imaging is recommended. Electronically Signed   By: Elmer Picker M.D.   On: 05/29/2022 13:04   DG Chest Portable 1 View  Result Date: 05/28/2022 CLINICAL DATA:  Shortness of breath for 2 weeks.  COVID positive. EXAM: PORTABLE  CHEST 1 VIEW COMPARISON:  05/02/2022 FINDINGS: Lungs are hyperinflated. Significant bullous changes at the RIGHT lung apex, similar to prior. There are no focal consolidations or pleural effusions. No pulmonary edema. IMPRESSION: Hyperinflation of the lungs. No evidence for acute pulmonary abnormality. Electronically Signed   By: Nolon Nations M.D.   On: 05/28/2022 17:12    Barton Dubois, MD  [facility]  If 7PM-7AM, please contact night-coverage [email or web address] Password [email or web address] 06/07/2022, 4:09 PM   LOS: 10 days

## 2022-06-07 NOTE — Progress Notes (Signed)
Patient remains sleepy after haldol this morning. She is able to answer questions. Refused to eat her dinner. HR 100 at this time, other VSS . Will continue to monitor

## 2022-06-07 NOTE — Progress Notes (Signed)
PT Cancellation Note  Patient Details Name: [name] MRN: [id number] DOB: 1951/04/23   Cancelled Treatment:    Reason Eval/Treat Not Completed: Medical issues which prohibited therapy.  Physical therapy held due to patient having high HR per RN.  Will check back tomorrow.   11:43 AM, 06/07/22 Lonell Grandchild, MPT Physical Therapist with [facility] [phone] office [phone] mobile phone

## 2022-06-07 NOTE — Progress Notes (Addendum)
EKG completed; given to RN.

## 2022-06-07 NOTE — Progress Notes (Signed)
Given PRN metoprolol for HR of 128 . Changed MEWS to yellow we will recheck     06/07/22 0900  Vitals  BP 114/79  MAP (mmHg) 90  BP Location Right Leg  BP Method Automatic  Patient Position (if appropriate) Lying  Pulse Rate (!) 128

## 2022-06-07 NOTE — Progress Notes (Signed)
   06/07/22 0910  Vitals  BP 95/68  Pulse Rate (!) 103  Pulse Rate Source Monitor   Post metoprolol BP 95/68 HR 103

## 2022-06-07 NOTE — Progress Notes (Signed)
Patient very confused / combative attempting to get OOB , Her HR 140-150 with movement. Norified Dr. Dyann Kief she refused PO medications , given PRN zofran states she was nauseated. Safety mits applied and will give PRN haldol as ordered. Attempted to kick and punch staff.

## 2022-06-08 DIAGNOSIS — J9601 Acute respiratory failure with hypoxia: Secondary | ICD-10-CM | POA: Diagnosis not present

## 2022-06-08 DIAGNOSIS — I5021 Acute systolic (congestive) heart failure: Secondary | ICD-10-CM | POA: Diagnosis not present

## 2022-06-08 DIAGNOSIS — J9602 Acute respiratory failure with hypercapnia: Secondary | ICD-10-CM | POA: Diagnosis not present

## 2022-06-08 DIAGNOSIS — I428 Other cardiomyopathies: Secondary | ICD-10-CM | POA: Diagnosis not present

## 2022-06-08 DIAGNOSIS — I10 Essential (primary) hypertension: Secondary | ICD-10-CM | POA: Diagnosis not present

## 2022-06-08 DIAGNOSIS — I5041 Acute combined systolic (congestive) and diastolic (congestive) heart failure: Secondary | ICD-10-CM | POA: Diagnosis not present

## 2022-06-08 LAB — GLUCOSE, CAPILLARY
Glucose-Capillary: 107 mg/dL — ABNORMAL HIGH (ref 70–99)
Glucose-Capillary: 133 mg/dL — ABNORMAL HIGH (ref 70–99)
Glucose-Capillary: 141 mg/dL — ABNORMAL HIGH (ref 70–99)
Glucose-Capillary: 144 mg/dL — ABNORMAL HIGH (ref 70–99)
Glucose-Capillary: 146 mg/dL — ABNORMAL HIGH (ref 70–99)
Glucose-Capillary: 90 mg/dL (ref 70–99)
Glucose-Capillary: 99 mg/dL (ref 70–99)

## 2022-06-08 LAB — CBC
HCT: 36.7 % (ref 36.0–46.0)
Hemoglobin: 11.5 g/dL — ABNORMAL LOW (ref 12.0–15.0)
MCH: 35.3 pg — ABNORMAL HIGH (ref 26.0–34.0)
MCHC: 31.3 g/dL (ref 30.0–36.0)
MCV: 112.6 fL — ABNORMAL HIGH (ref 80.0–100.0)
Platelets: 152 10*3/uL (ref 150–400)
RBC: 3.26 MIL/uL — ABNORMAL LOW (ref 3.87–5.11)
RDW: 16.4 % — ABNORMAL HIGH (ref 11.5–15.5)
WBC: 12.9 10*3/uL — ABNORMAL HIGH (ref 4.0–10.5)
nRBC: 0 % (ref 0.0–0.2)

## 2022-06-08 LAB — BASIC METABOLIC PANEL
Anion gap: 9 (ref 5–15)
BUN: 16 mg/dL (ref 8–23)
CO2: 21 mmol/L — ABNORMAL LOW (ref 22–32)
Calcium: 8.3 mg/dL — ABNORMAL LOW (ref 8.9–10.3)
Chloride: 115 mmol/L — ABNORMAL HIGH (ref 98–111)
Creatinine, Ser: 0.71 mg/dL (ref 0.44–1.00)
GFR, Estimated: >60 mL/min (ref >60)
Glucose, Bld: 111 mg/dL — ABNORMAL HIGH (ref 70–99)
Potassium: 3.1 mmol/L — ABNORMAL LOW (ref 3.5–5.1)
Sodium: 145 mmol/L (ref 135–145)

## 2022-06-08 LAB — TROPONIN I (HIGH SENSITIVITY)
Troponin I (High Sensitivity): 133 ng/L (ref <18)
Troponin I (High Sensitivity): 113 ng/L (ref <18)

## 2022-06-08 MED ORDER — ROSUVASTATIN CALCIUM 20 MG PO TABS
20.0000 mg | ORAL_TABLET | Freq: Every evening | ORAL | Status: DC
  Administered 2022-06-08 – 2022-06-09 (×2): 20 mg via ORAL
  Filled 2022-06-08 (×3): qty 1

## 2022-06-08 MED ORDER — LOSARTAN POTASSIUM 25 MG PO TABS
12.5000 mg | ORAL_TABLET | Freq: Every day | ORAL | Status: DC
  Administered 2022-06-08 – 2022-06-09 (×2): 12.5 mg via ORAL
  Filled 2022-06-08 (×3): qty 1

## 2022-06-08 MED ORDER — ALUM & MAG HYDROXIDE-SIMETH 200-200-20 MG/5ML PO SUSP
30.0000 mL | Freq: Once | ORAL | Status: AC
  Administered 2022-06-08: 30 mL via ORAL
  Filled 2022-06-08: qty 30

## 2022-06-08 MED ORDER — PREDNISONE 20 MG PO TABS
40.0000 mg | ORAL_TABLET | Freq: Every day | ORAL | Status: DC
  Administered 2022-06-09: 40 mg via ORAL
  Filled 2022-06-08 (×2): qty 2

## 2022-06-08 MED ORDER — POTASSIUM CHLORIDE 10 MEQ/100ML IV SOLN
10.0000 meq | INTRAVENOUS | Status: AC
  Administered 2022-06-08 (×4): 10 meq via INTRAVENOUS
  Filled 2022-06-08 (×4): qty 100

## 2022-06-08 MED ORDER — BISOPROLOL FUMARATE 5 MG PO TABS
10.0000 mg | ORAL_TABLET | Freq: Every day | ORAL | Status: DC
  Administered 2022-06-08 – 2022-06-09 (×2): 10 mg via ORAL
  Filled 2022-06-08 (×3): qty 2

## 2022-06-08 MED ORDER — FUROSEMIDE 10 MG/ML IJ SOLN
40.0000 mg | Freq: Once | INTRAMUSCULAR | Status: AC
  Administered 2022-06-08: 40 mg via INTRAVENOUS
  Filled 2022-06-08: qty 4

## 2022-06-08 NOTE — TOC Progression Note (Signed)
Transition of Care [facility]) - Progression Note    Patient Details  Name: [name] MRN: [id number] Date of Birth: 05/15/1951  Transition of Care [facility] CM/SW Contact  Salome Arnt, [place] Phone Number: 06/08/2022, 11:22 AM  Clinical Narrative:  Discussed PT recommendation for SNF with pt's son, Elta Guadeloupe. Elta Guadeloupe is agreeable to short term SNF in [place]. Medicare.gov SNF list with ratings left in pt's room. Will initiate bed search.      Expected Discharge Plan: [place] Barriers to Discharge: Continued Medical Work up  Expected Discharge Plan and Services Expected Discharge Plan: [place] arrangements for the past 2 months: Single Family Home                                       Social Determinants of Health (SDOH) Interventions    Readmission Risk Interventions     No data to display

## 2022-06-08 NOTE — Progress Notes (Signed)
Nutrition Follow-up  DOCUMENTATION CODES:   Non-severe (moderate) malnutrition in context of chronic illness  Underweight  INTERVENTION:   Continue to encourage intake of meals and supplements.   Continue to offer Ensure Enlive po TID, each supplement provides 350 kcal and 20 grams of protein.  Consider placing NG tube for enteral nutrition supplementation: Osmolite 1.2 at 55 ml/h would provide 1584 kcal, 73 gm protein, 1082 ml free water daily to meet 100% of nutrition needs.   NUTRITION DIAGNOSIS:   Moderate Malnutrition related to chronic illness as evidenced by percent weight loss, mild fat depletion, mild muscle depletion.  Ongoing  GOAL:  Patient will meet greater than or equal to 90% of their needs  Unmet  MONITOR:  PO intake, Supplement acceptance, Skin, Labs  REASON FOR ASSESSMENT:   Consult Calorie Count  ASSESSMENT:  Patient is a 71 yo female with hx of CVA, blindness in right eye, GERD, CKD-3a, CAD, HLD, HTN. Recent COVID, AKI, colitis. Presents with shortness of breath and required BiPAP. COPD with acute exacerbation.  Patient is now on a dysphagia 3 diet with thin liquids. Calorie count ordered 9/15 x 48 hours. Patient is consuming 0% of meals. She is also refusing to drink the Ensure supplements.   Labs 9/17 reviewed. Na 146, K 3.3 CBG: 144-90-107 this AM  Medications reviewed and include Novolog, Solu-medrol, Remeron, Protonix, K-Phos, Miralax, Klor-Con, Florastor.  Admission weight  40 kg Current weight 46 kg Net IO Since Admission: 4,431.63 mL [06/08/22 0953]  Diet Order:   Diet Order             DIET DYS 3 Room service appropriate? Yes; Fluid consistency: Thin  Diet effective now                   EDUCATION NEEDS:  Not appropriate for education at this time  Skin:  Skin Assessment: Skin Integrity Issues: Skin Integrity Issues:: Stage II Stage II: sacrum  Last BM:  9/16 type 6  Height:   Ht Readings from Last 1 Encounters:   05/31/22 5' (1.524 m)    Weight:   Wt Readings from Last 1 Encounters:  06/08/22 46 kg    Ideal Body Weight:   45 kg  BMI:  Body mass index is 19.81 kg/m.  Estimated Nutritional Needs:   Kcal:  1500-1600  Protein:  70-85 gm  Fluid:  1200 ml daily   Lucas Mallow RD, LDN, CNSC Please refer to Amion for contact information.

## 2022-06-08 NOTE — Progress Notes (Addendum)
RT went to perform EKG on patient. Upon arrival RN had patient hooked up to the pulse ox and pulse ox started reading SPO2 in the 50's. We administered O2 to the patient by [place] and O2 saturation came up to the mid 90's. We used two different machines to read the SPO2 and both read the same with a good waveform. Patient states she was only a little SOB at the time. RN and RT moved patient from the chair to the bed. RT performed EKG. RN notified the MD.

## 2022-06-08 NOTE — Progress Notes (Signed)
DOB: Jan 03, 1951 Date: 06/08/22   Must ID: [id number]   To Whom it May Concern:  Please be advised that the above named patient will require a short-term nursing home stay- anticipated 30 days or less rehabilitation and strengthening. The plan is for return home.

## 2022-06-08 NOTE — Plan of Care (Signed)
  Problem: Acute Rehab PT Goals(only PT should resolve) Goal: Pt Will Go Supine/Side To Sit Outcome: Progressing Flowsheets (Taken 06/08/2022 1228) Pt will go Supine/Side to Sit: with min guard assist Goal: Patient Will Transfer Sit To/From Stand Outcome: Progressing Flowsheets (Taken 06/08/2022 1228) Patient will transfer sit to/from stand: with min guard assist Goal: Pt Will Transfer Bed To Chair/Chair To Bed Outcome: Progressing Flowsheets (Taken 06/08/2022 1228) Pt will Transfer Bed to Chair/Chair to Bed:  min guard assist  with min assist Goal: Pt Will Ambulate Outcome: Progressing Flowsheets (Taken 06/08/2022 1228) Pt will Ambulate:  25 feet  with minimal assist  with rolling walker   12:29 PM, 06/08/22 Lonell Grandchild, MPT Physical Therapist with [facility] [phone] office [phone] mobile phone

## 2022-06-08 NOTE — Progress Notes (Addendum)
Rounding Note    Patient Name: [name] Date of Encounter: 06/08/2022  [place] Cardiologist: Donato Heinz, MD    Subjective   Feels her heart racing.   Inpatient Medications    Scheduled Meds:  arformoterol  15 mcg Nebulization BID   bisoprolol  5 mg Oral Daily   budesonide (PULMICORT) nebulizer solution  0.5 mg Nebulization BID   Chlorhexidine Gluconate Cloth  6 each Topical Q0600   citalopram  20 mg Oral Daily   clopidogrel  75 mg Oral Daily   feeding supplement  237 mL Oral TID BM   heparin  5,000 Units Subcutaneous Q8H   insulin aspart  0-9 Units Subcutaneous Q4H   methylPREDNISolone (SOLU-MEDROL) injection  40 mg Intravenous Daily   mirtazapine  7.5 mg Oral QHS   mupirocin ointment   Nasal BID   pantoprazole (PROTONIX) IV  40 mg Intravenous Q12H   phosphorus  500 mg Oral BID   polyethylene glycol  17 g Oral Daily   potassium chloride  40 mEq Oral Once   revefenacin  175 mcg Nebulization Daily   saccharomyces boulardii  250 mg Oral BID   sodium chloride flush  10-40 mL Intracatheter Q12H   Continuous Infusions:  sodium chloride Stopped (06/06/22 2139)   PRN Meds: sodium chloride, acetaminophen **OR** acetaminophen, albuterol, diphenoxylate-atropine, fentaNYL, fentaNYL (SUBLIMAZE) injection, haloperidol lactate, hydrALAZINE, metoprolol tartrate, ondansetron **OR** ondansetron (ZOFRAN) IV, mouth rinse, polyethylene glycol, polyvinyl alcohol, sodium chloride flush   Vital Signs    Vitals:   06/07/22 2028 06/07/22 2132 06/08/22 0612 06/08/22 0805  BP:  (!) 144/72 (!) 163/70   Pulse:  96 (!) 108 (!) 112  Resp:  (!) 22 (!) 22 18  Temp:  99.2 F (37.3 C) 98.7 F (37.1 C)   TempSrc:  Oral    SpO2: 96% 96% 100% 94%  Weight:   46 kg   Height:        Intake/Output Summary (Last 24 hours) at 06/08/2022 0832 Last data filed at 06/08/2022 0600 Gross per 24 hour  Intake 280.5 ml  Output 650 ml  Net -369.5 ml      06/08/2022     6:12 AM 06/05/2022    5:00 AM 06/04/2022    5:00 AM  Last 3 Weights  Weight (lbs) 101 lb 6.6 oz 98 lb 8.7 oz 96 lb 9 oz  Weight (kg) 46 kg 44.7 kg 43.8 kg      Telemetry    Sinus tachycardia 130/m - Personally Reviewed  ECG    NSR with deep antlat TWI new since Aug - Personally Reviewed  Physical Exam    GEN: No acute distress.   Neck: No JVD Cardiac: RRR 130/m, no murmurs, rubs, or gallops.  Respiratory: decreased breath sounds but Clear to auscultation bilaterally. GI: Soft, nontender, non-distended  MS: No edema; No deformity. Neuro:  Nonfocal  Psych: Normal affect   Labs    High Sensitivity Troponin:   Recent Labs  Lab 05/28/22 1640 05/28/22 2259 05/29/22 0438 05/29/22 0811 05/29/22 1012  TROPONINIHS 13 375* 478* 400* 368*     Chemistry Recent Labs  Lab 06/02/22 0553 06/03/22 0436 06/07/22 0603  NA 148* 146* 146*  K 4.5 3.6 3.3*  CL 121* 118* 112*  CO2 24 26 26   GLUCOSE 199* 257* 97  BUN 47* 44* 22  CREATININE 1.02* 0.84 0.82  CALCIUM 8.7* 8.7* 8.8*  MG 2.0 1.9 2.0  GFRNONAA 58* >60 >60  ANIONGAP 3*  2* 8    Lipids No results for input(s): "CHOL", "TRIG", "HDL", "LABVLDL", "LDLCALC", "CHOLHDL" in the last 168 hours.  Hematology Recent Labs  Lab 06/06/22 0410 06/07/22 0658 06/08/22 0618  WBC 12.8* 19.8* 12.9*  RBC 2.69* 3.33* 3.26*  HGB 9.4* 11.6* 11.5*  HCT 29.4* 36.8 36.7  MCV 109.3* 110.5* 112.6*  MCH 34.9* 34.8* 35.3*  MCHC 32.0 31.5 31.3  RDW 16.7* 16.7* 16.4*  PLT 190 212 152   Thyroid No results for input(s): "TSH", "FREET4" in the last 168 hours.  BNPNo results for input(s): "BNP", "PROBNP" in the last 168 hours.  DDimer No results for input(s): "DDIMER" in the last 168 hours.   Radiology    DG Chest Port 1 View  Result Date: 06/07/2022 CLINICAL DATA:  Acute respiratory failure with hypoxia and hypercarbia. EXAM: PORTABLE CHEST 1 VIEW COMPARISON:  06/03/2022. FINDINGS: The heart size and mediastinal contours are stable. There  is atherosclerotic calcification of the aorta. Emphysematous changes are noted in the lungs. Coarse interstitial markings are present bilaterally and likely chronic. No consolidation, effusion, or pneumothorax. No acute osseous abnormality. IMPRESSION: 1. No active disease. 2. Emphysema. Electronically Signed   By: Thornell Sartorius M.D.   On: 06/07/2022 22:07    Cardiac Studies    05/31/22 TTE: IMPRESSIONS     1. Left ventricular ejection fraction, by estimation, is 35 to 40%. The  left ventricle has moderately decreased function. The left ventricle  demonstrates regional wall motion abnormalities (see scoring  diagram/findings for description). The mid septal  and all apical LV segments are akinetic (consistent with prior TTE).   2. Right ventricular systolic function is mildly reduced. The right  ventricular size is normal.   3. The mitral valve is degenerative. Moderate mitral annular  calcification.   Comparison(s): Compared to prior TTE on 05/29/22, there is no significant  change.    05/29/22 TTE: IMPRESSIONS    1. Left ventricular ejection fraction, by estimation, is 35 to 40%. The  left ventricle has moderately decreased function. The left ventricle  demonstrates regional wall motion abnormalities (see scoring  diagram/findings for description). Left ventricular   diastolic parameters are indeterminate.   2. Right ventricular systolic function is mildly reduced. The right  ventricular size is normal. There is moderately elevated pulmonary artery  systolic pressure. The estimated right ventricular systolic pressure is  51.0 mmHg.   3. The mitral valve is degenerative. Trivial mitral valve regurgitation.   4. The aortic valve was not well visualized. Aortic valve regurgitation  is not visualized. No aortic stenosis is present.   5. The inferior vena cava is dilated in size with <50% respiratory  variability, suggesting right atrial pressure of 15 mmHg.    Conclusion(s)/Recommendation(s): Normal systolic function at base with  apical akinesis, differential includes LAD infarct vs stress induced  cardiomyopathy.   Patient Profile     71 y.o. female with a hx of reported CAD with MI/PTCA and possible stent to RCA in 1997, NSTEMI 2004 with occluded RCA treated medically, HTN, dyslipidemia, anxiety, depression with prior SI, remote CVA ~2003, former tobacco abuse, and severe COPD who presented with acute hypoxic respiratory failure in the setting of acute COPD exacerbation, pneumonia and recent COVID 19 infection with course complicated by NSTEMI and newly diagnosed HFrEF with LVEF 35-40% for which Cardiology was consulted.   Assessment & Plan    1.  HFrEF, newly documented with LVEF 35 to 40% and wall motion abnormalities suggesting possibility of stress-induced cardiomyopathy  or LAD distribution infarct.  Complicating picture is recent COVID-19 with current hypoxic respiratory failure due to pneumonia and underlying severe COPD.   Repeat limited echocardiogram  shows no change in LVEF, which would not necessarily be expected in the short-term anyway.   2.  History of CAD with reported inferior infarct and PTCA with stent to the RCA in 1997, NSTEMI in 2004 with documentation of occluded RCA that was managed medically.  Current high-sensitivity troponin I levels 300-400 range, not necessarily consistent with ACS or in line with degree of LV dysfunction in terms of an acute event.  She completed 48-hour course of IV heparin.  Currently on Plavix and IV Lopressor.   3.  Fluctuating anemia, hemoglobin up to 11.5.  No obvious active bleeding.   4.  Acute hypoxic and hypercarbic respiratory failure with lobar pneumonia, MRSA positive, recent COVID-19 infection as well, and underlying severe COPD. Off vent 9/14 and on O2   5.  Possible CKD stage IIIa, creatinine 1.21.  6. Sinus tachycardia 130-140/m on bisoprolol 5 mg daily-will increase to 10 mg daily.  Will order EKG to make sure not flutter. K 3.3 yest recheck today       For questions or updates, please contact [place] Please consult [email or web address] for contact info under        Signed, Ermalinda Barrios, PA-C  06/08/2022, 8:32 AM   Attending note  Patient seen and discussed with PA Bonnell Public, I agree with her documentation.   1.HFrEF - new diagnosis this admissio - 05/29/22 echo LVEF 35-40%, apical akinesis, indet diastolic fxn, mild RV dysfunction, mod pulm HTN PASP 51,  - 05/31/22 limited echo LVE 35-40%, apex akinetic.  - suggestive of stress induced CM but cannot rule out ischemic CM. - CXR yesterday no active disease.    - ACEi allergy with swelling, not elgible for ARNI. Guidelines would indicate ARB reasonable as alternative as cross reactivity is low.  - medical therapy with bisoprolol 10mg  daily, start losartan 12.5mg  daily. Aldactone and SGLT2i over next few days.   - some signs of fluid overload this AM by exam, +JVD and crackles. Dose IV lasix 40mg  x 1, add bnp to labs, check reds vest.   2. History of CAD - PTCA to RCA in 1997. NSTEMI 2004 with RCA CTO managed medically - trop peak 478 this admit in setting of respiratory failure on vent initially - completed 48 hrs heparin  - ASA allergy, she is on plavix. Also on bisoprolol, losartan started. Start crestor 20mg  daily.  - cath once more medically stable, note she has contrast allergy.   -yesterday new deep inverted anterior/anterolateral Twaves - has some chest burning pain that lasts for hours at a time, worst with taking pills. No other chest pains.  - recycle enzymes, given oral liquid maalox possible GI pain.   3. Respiratory failure - secondary to recent COVID infection, COPD exacerbation, pneumonia - initially on vent, extubated 06/04/22.  - completed antibiotic course, remains on IV steroids and nebs.   4. Sinus tachycardia - variable heart rates, at times low 100s up to 130s-140s. Tele shows  sinus tach - remains on steroids and nebs - has some burning like chest pains mild to moderate, no SOB or marked hypoxia. No significant anemia or signs of hypovolemia - perhaps medication related, continue to monitor.    Carlyle Dolly MD

## 2022-06-08 NOTE — Evaluation (Signed)
Physical Therapy Evaluation Patient Details Name: [name] MRN: [id number] DOB: [birth year] Today's Date: 06/08/2022  History of Present Illness  [name] is a 71 y.o. 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  Patient was brought to the ED reports of difficulty breathing.       Patient was recently admitted 8/12 to 8/19 for COVID-19 infection, with acute hypoxic respiratory failure.  Hypoxia resolved on discharge.  She also had AKI, acute colitis and was treated with Levaquin and Flagyl.  Patient tells me since she was discharged from the hospital she has been having difficulty breathing, but today her breathing significantly worsened.  She denies swelling to her lower extremities.      At time of my evaluation she is on BiPAP, she became severely dyspneic, tachycardic, tachypneic, tripoding.  She was given breathing treatments, 0.5 mg of Ativan.  With worsening ABG despite 3 hours on BiPAP, initial plan was to intubate patient, later patient calmed down, her dyspnea and vitals significantly improved.  Patient's denies COPD diagnosis, she is not on home O2 normally, and she quit smoking about 10 years ago.   Clinical Impression  Patient demonstrates slow labored movement for sitting up at bedside, very unsteady on feet, unable to maintain standing balance without AD, required use of RW for safety and limited to few labored side steps before having to sit due generalized weakness and fatigue.  Patient will benefit from continued skilled physical therapy in hospital and recommended venue below to increase strength, balance, endurance for safe ADLs and gait.         Recommendations for follow up therapy are one component of a multi-disciplinary discharge planning process, led by the attending physician.  Recommendations may be updated based on patient status, additional functional criteria and  insurance authorization.  Follow Up Recommendations Skilled nursing-short term rehab (<3 hours/day) Can patient physically be transported by private vehicle: Yes    Assistance Recommended at Discharge Intermittent Supervision/Assistance  Patient can return home with the following  A lot of help with walking and/or transfers;A lot of help with bathing/dressing/bathroom;Help with stairs or ramp for entrance;Assistance with cooking/housework    Equipment Recommendations None recommended by PT  Recommendations for Other Services       Functional Status Assessment Patient has had a recent decline in their functional status and demonstrates the ability to make significant improvements in function in a reasonable and predictable amount of time.     Precautions / Restrictions Precautions Precautions: Fall Restrictions Weight Bearing Restrictions: No      Mobility  Bed Mobility Overal bed mobility: Needs Assistance Bed Mobility: Supine to Sit     Supine to sit: Mod assist     General bed mobility comments: increased time, labored movement    Transfers Overall transfer level: Needs assistance Equipment used: Rolling walker (2 wheels) Transfers: Sit to/from Stand, Bed to chair/wheelchair/BSC Sit to Stand: Min assist, Mod assist   Step pivot transfers: Mod assist       General transfer comment: unsteady labored movement requring use of RW for safety    Ambulation/Gait Ambulation/Gait assistance: Mod assist, Max assist Gait Distance (Feet): 5 Feet Assistive device: Rolling walker (2 wheels) Gait Pattern/deviations: Decreased step length - right, Decreased step length - left, Decreased stride length, Shuffle Gait velocity: slow     General Gait Details: limited to few slow labored side steps with buckling of knees due to  weakness  Stairs            Wheelchair Mobility    Modified Rankin (Stroke Patients Only)       Balance Overall balance assessment: Needs  assistance Sitting-balance support: Feet supported, No upper extremity supported Sitting balance-Leahy Scale: Fair Sitting balance - Comments: seated at EOB   Standing balance support: During functional activity, Reliant on assistive device for balance, Bilateral upper extremity supported Standing balance-Leahy Scale: Poor Standing balance comment: fair/poor using RW                             Pertinent Vitals/Pain Pain Assessment Pain Assessment: 0-10 Pain Score: 8  Pain Location: burning sensation in chest Pain Descriptors / Indicators: Burning Pain Intervention(s): Limited activity within patient's tolerance, Monitored during session, Repositioned    Home Living Family/patient expects to be discharged to:: Private residence Living Arrangements: Alone Available Help at Discharge: Family Type of Home: House Home Access: Stairs to enter Entrance Stairs-Rails: Right;Left;Can reach both Entrance Stairs-Number of Steps: 6   Home Layout: One level Home Equipment: Conservation officer, nature (2 wheels);Cane - single point;BSC/3in1;Shower seat      Prior Function               Mobility Comments: short distance and household ambulation without at ADLs Comments: Independent     Hand Dominance        Extremity/Trunk Assessment   Upper Extremity Assessment Upper Extremity Assessment: Generalized weakness    Lower Extremity Assessment Lower Extremity Assessment: Generalized weakness    Cervical / Trunk Assessment Cervical / Trunk Assessment: Normal  Communication   Communication: Receptive difficulties (slow to respond to questions)  Cognition Arousal/Alertness: Awake/alert Behavior During Therapy: WFL for tasks assessed/performed Overall Cognitive Status: No family/caregiver present to determine baseline cognitive functioning                                          General Comments      Exercises     Assessment/Plan    PT Assessment  Patient needs continued PT services  PT Problem List Decreased strength;Decreased activity tolerance;Decreased balance;Decreased mobility       PT Treatment Interventions DME instruction;Gait training;Stair training;Functional mobility training;Therapeutic activities;Therapeutic exercise;Balance training;Patient/family education    PT Goals (Current goals can be found in the Care Plan section)  Acute Rehab PT Goals Patient Stated Goal: return home after rehab PT Goal Formulation: With patient Time For Goal Achievement: 06/22/22 Potential to Achieve Goals: Good    Frequency Min 3X/week     Co-evaluation               AM-PAC PT "6 Clicks" Mobility  Outcome Measure Help needed turning from your back to your side while in a flat bed without using bedrails?: A Little Help needed moving from lying on your back to sitting on the side of a flat bed without using bedrails?: A Lot Help needed moving to and from a bed to a chair (including a wheelchair)?: A Lot Help needed standing up from a chair using your arms (e.g., wheelchair or bedside chair)?: A Lot Help needed to walk in hospital room?: A Lot Help needed climbing 3-5 steps with a railing? : Total 6 Click Score: 12    End of Session   Activity Tolerance: Patient tolerated treatment well;Patient limited by fatigue Patient  left: in chair;with call bell/phone within reach;with chair alarm set Nurse Communication: Mobility status PT Visit Diagnosis: Unsteadiness on feet (R26.81);Other abnormalities of gait and mobility (R26.89);Muscle weakness (generalized) (M62.81)    Time: 7711-6579 PT Time Calculation (min) (ACUTE ONLY): 30 min   Charges:     PT Treatments $Therapeutic Activity: 23-37 mins        12:25 PM, 06/08/22 Lonell Grandchild, MPT Physical Therapist with [facility] 336 [phone] office [phone] mobile phone

## 2022-06-08 NOTE — NC FL2 (Signed)
[place] LEVEL OF CARE SCREENING TOOL     IDENTIFICATION  Patient Name: [name] Birthdate: 10/28/1950 Sex: female Admission Date (Current Location): 05/28/2022  [facility] and [place] Number:  [employer] and Address:  [facility] [address] [place]      Provider Number: [id number]  Attending Physician Name and Address:  Barton Dubois, MD  Relative Name and Phone Number:       Current Level of Care: Hospital Recommended Level of Care: [place] Prior Approval Number:    Date Approved/Denied:   PASRR Number: pending  Discharge Plan: SNF    Current Diagnoses: Patient Active Problem List   Diagnosis Date Noted   Impaired glucose tolerance 06/02/2022   Hypernatremia 06/02/2022   Hypophosphatemia 06/01/2022   Hyperkalemia 05/31/2022   Malnutrition of moderate degree 05/30/2022   Cardiomyopathy ([place]) 05/30/2022   Lobar pneumonia ([place]) 05/30/2022   Elevated troponin 05/29/2022   Chronic kidney disease, stage 3a ([place]) 05/29/2022   COPD with acute exacerbation ([place]) 05/29/2022   Anemia    Gastroesophageal reflux disease    Nausea vomiting and diarrhea    Dysphagia    Pressure injury of skin 05/03/2022   COVID-19 virus infection 05/02/2022   AKI (acute kidney injury) ([place]) 05/02/2022   Acute respiratory failure with hypoxia and hypercarbia ([facility]) 05/02/2022   Hypokalemia    Calculus of gallbladder with acute cholecystitis and obstruction    Choledocholithiasis 10/09/2021   Noninfectious gastroenteritis 07/16/2014   Constipation - functional 10/27/2012   Colon cancer screening 10/27/2012   History of stroke 10/26/2012   Chronic back pain 10/26/2012   HYPERLIPIDEMIA-MIXED 03/18/2009   Essential hypertension 03/18/2009   RENAL DISEASE 03/18/2009    Orientation RESPIRATION BLADDER Height & Weight     Self  O2 (2L) External catheter Weight: 101 lb 6.6 oz (46 kg) Height:  5' (152.4 cm)   BEHAVIORAL SYMPTOMS/MOOD NEUROLOGICAL BOWEL NUTRITION STATUS      Continent Diet (Dysphagia 3 with thin liquids. See d/c summary for updates.)  AMBULATORY STATUS COMMUNICATION OF NEEDS Skin   Extensive Assist Verbally Skin abrasions, Other (Comment) (weeping bilateral arms. Redness to sacrum. Stage II to sacrum.)                       Personal Care Assistance Level of Assistance  Bathing, Feeding, Dressing Bathing Assistance: Maximum assistance Feeding assistance: Limited assistance Dressing Assistance: Maximum assistance     Functional Limitations Info  Sight, Hearing, Speech Sight Info: Impaired Hearing Info: Impaired Speech Info: Adequate    SPECIAL CARE FACTORS FREQUENCY  PT (By licensed PT)     PT Frequency: 5x weekly              Contractures      Additional Factors Info  Code Status, Allergies, Psychotropic, Isolation Precautions Code Status Info: Full code Allergies Info: Penicillins, Aspirin, Bee Venom, Contrast Media (iodinated Contrast Media), Dilaudid (hydromorphone Hcl), Lisinopril, Motrin (ibuprofen) Psychotropic Info: Celexa, Remeron   Isolation Precautions Info: MRSA (nasal swab) 05/29/22     Current Medications (06/08/2022):  This is the current hospital active medication list Current Facility-Administered Medications  Medication Dose Route Frequency Provider Last Rate Last Admin   0.9 %  sodium chloride infusion   Intravenous PRN Barton Dubois, MD   Stopped at 06/06/22 2139   acetaminophen (TYLENOL) tablet 650 mg  650 mg Oral Q6H PRN Barton Dubois, MD   650 mg at 06/06/22  0846   Or   acetaminophen (TYLENOL) suppository 650 mg  650 mg Rectal Q6H PRN Barton Dubois, MD       albuterol (PROVENTIL) (2.5 MG/3ML) 0.083% nebulizer solution 2.5 mg  2.5 mg Nebulization Q4H PRN Barton Dubois, MD   2.5 mg at 05/30/22 1955   arformoterol (BROVANA) nebulizer solution 15 mcg  15 mcg Nebulization BID Barton Dubois, MD   15 mcg at 06/08/22 0804   bisoprolol  (ZEBETA) tablet 10 mg  10 mg Oral Daily Imogene Burn, PA-C   10 mg at 06/08/22 0944   budesonide (PULMICORT) nebulizer solution 0.5 mg  0.5 mg Nebulization BID Barton Dubois, MD   0.5 mg at 06/08/22 0805   Chlorhexidine Gluconate Cloth 2 % PADS 6 each  6 each Topical Q0600 Barton Dubois, MD   6 each at 06/08/22 0556   citalopram (CELEXA) tablet 20 mg  20 mg Oral Daily Barton Dubois, MD   20 mg at 06/08/22 0943   clopidogrel (PLAVIX) tablet 75 mg  75 mg Oral Daily Barton Dubois, MD   75 mg at 06/08/22 0944   diphenoxylate-atropine (LOMOTIL) 2.5-0.025 MG per tablet 1 tablet  1 tablet Oral QID PRN Barton Dubois, MD   1 tablet at 06/06/22 2140   feeding supplement (ENSURE ENLIVE / ENSURE PLUS) liquid 237 mL  237 mL Oral TID BM Barton Dubois, MD   237 mL at 06/06/22 1214   fentaNYL (SUBLIMAZE) bolus via infusion 25-100 mcg  25-100 mcg Intravenous Q15 min PRN Barton Dubois, MD   25 mcg at 06/04/22 0959   fentaNYL (SUBLIMAZE) injection 25-50 mcg  25-50 mcg Intravenous Q30 min PRN Barton Dubois, MD       haloperidol lactate (HALDOL) injection 1 mg  1 mg Intravenous Q6H PRN Barton Dubois, MD   1 mg at 06/07/22 0900   heparin injection 5,000 Units  5,000 Units Subcutaneous Q8H Barton Dubois, MD   5,000 Units at 06/08/22 0556   hydrALAZINE (APRESOLINE) injection 10 mg  10 mg Intravenous Q6H PRN Barton Dubois, MD   10 mg at 06/02/22 2325   insulin aspart (novoLOG) injection 0-9 Units  0-9 Units Subcutaneous Q4H Barton Dubois, MD   1 Units at 06/08/22 1013   losartan (COZAAR) tablet 12.5 mg  12.5 mg Oral Daily Arnoldo Lenis, MD   12.5 mg at 06/08/22 1014   methylPREDNISolone sodium succinate (SOLU-MEDROL) 40 mg/mL injection 40 mg  40 mg Intravenous Daily Barton Dubois, MD   40 mg at 06/08/22 0936   metoprolol tartrate (LOPRESSOR) injection 5 mg  5 mg Intravenous Q6H PRN Barton Dubois, MD   5 mg at 06/08/22 0924   mirtazapine (REMERON) tablet 7.5 mg  7.5 mg Oral QHS Barton Dubois, MD   7.5  mg at 06/06/22 2140   mupirocin ointment (BACTROBAN) 2 %   Nasal BID Barton Dubois, MD   Given at 06/08/22 1020   ondansetron (ZOFRAN) tablet 4 mg  4 mg Oral Q6H PRN Barton Dubois, MD       Or   ondansetron [facility]) injection 4 mg  4 mg Intravenous Q6H PRN Barton Dubois, MD   4 mg at 06/07/22 0847   Oral care mouth rinse  15 mL Mouth Rinse PRN Barton Dubois, MD       pantoprazole (PROTONIX) injection 40 mg  40 mg Intravenous Q12H Barton Dubois, MD   40 mg at 06/08/22 5009   phosphorus (K PHOS NEUTRAL) tablet 500 mg  500 mg Oral BID Dyann Kief,  Clifton James, MD   500 mg at 06/08/22 0944   polyethylene glycol (MIRALAX / GLYCOLAX) packet 17 g  17 g Oral Daily PRN Barton Dubois, MD       polyethylene glycol (MIRALAX / GLYCOLAX) packet 17 g  17 g Oral Daily Barton Dubois, MD       polyvinyl alcohol (LIQUIFILM TEARS) 1.4 % ophthalmic solution 2 drop  2 drop Both Eyes PRN Barton Dubois, MD   2 drop at 06/06/22 1332   potassium chloride 10 mEq in 100 mL IVPB  10 mEq Intravenous Q1 Hr x 4 Arnoldo Lenis, MD 100 mL/hr at 06/08/22 1047 10 mEq at 06/08/22 1047   revefenacin (YUPELRI) nebulizer solution 175 mcg  175 mcg Nebulization Daily Barton Dubois, MD   175 mcg at 06/08/22 0804   rosuvastatin (CRESTOR) tablet 20 mg  20 mg Oral QPM Arnoldo Lenis, MD       saccharomyces boulardii (FLORASTOR) capsule 250 mg  250 mg Oral BID Barton Dubois, MD   250 mg at 06/08/22 0944   sodium chloride flush (NS) 0.9 % injection 10-40 mL  10-40 mL Intracatheter Q12H Barton Dubois, MD   10 mL at 06/08/22 0959   sodium chloride flush (NS) 0.9 % injection 10-40 mL  10-40 mL Intracatheter PRN Barton Dubois, MD         Discharge Medications: Please see discharge summary for a list of discharge medications.  Relevant Imaging Results:  Relevant Lab Results:   Additional Information SSN: [id number].  Salome Arnt, LCSW

## 2022-06-08 NOTE — Progress Notes (Signed)
PROGRESS NOTE  [name] ZOX:[id number] DOB: 23-Oct-1950 DOA: 05/28/2022 PCP: April Meiers, MD  Brief History:  71 year old female with a history of coronary disease, hypertension, hyperlipidemia, functional constipation, B12 and folate deficiency, anxiety, and prior stroke presenting with shortness of breath and respiratory failure.  The patient was recently mid to the hospital from 05/02/2022 to 05/09/2022 when she had acute respiratory failure secondary to COVID-19.  During that hospitalization, the patient was also noted to have colitis for which she was treated with levofloxacin and metronidazole.  She completed a course of antibiotics prior to discharge.  She was discharged home with a prednisone taper. She states that she has had some shortness of breath since discharge from the hospital, but this has significantly worsened in the 24 hours prior to admission.  Upon arrival to the emergency department, the patient was noted to have respiratory distress patient was placed on BiPAP. Repeat ABG on BiPAP did not show significant improvement.  There was plans for intubation, but the patient was given Ativan with improvement clinically.  She remained on BiPAP overnight.  Notably, the patient states that she started having some burning chest discomfort on the morning of 05/28/2022.  She states that it somewhat improved while she was on BiPAP, but continues to have the chest discomfort.  She was taken off of BiPAP in the morning of 05/29/2022 and stated that she had some worsening of her burning chest discomfort.  She developed some increased shortness of breath resulting in placement back on BiPAP on the morning of 05/29/22.  Troponins were noted to be 375>> 478.  D-dimer was also noted to be elevated 3.97.  The patient was started on IV heparin. The patient was given Solu-Medrol 125 mg IV, morphine 1 mg IV and placed back on BiPAP.  Repeat EKG showed sinus tachycardia with T wave  inversion V2-V5.  Troponins continue to be cycled. cardiology was consulted to assist with management.   On the evening of 05/30/2022, the patient developed respiratory distress and hypoxia on BiPAP.  She remained agitated despite pharmacologic and nonpharmacologic interventions.  After discussion with the patient's family, decision was made to intubate the patient.  Repeat limited echocardiogram was ordered for 05/31/22.     Assessment and Plan: * Acute respiratory failure with hypoxia and hypercarbia (HCC) - Presented with respiratory distress with oxygen saturation 77% on room air.  Appears to be secondary to COPD exacerbation and pneumonia -Patient failed the use of BiPAP and in the requiring to be intubated and mechanically ventilated.  (05/30/2022) -CTA chest--neg PE, severe emphysema; multifocal patchy infiltrates RUL, RLL, LLL -Patient has been extubated (06/04/2022);  -Currently on 1-2 L nasal cannula supplementation and reporting no significant shortness of breath -Continue current bronchodilator management and follow clinical response. -Continue t the use of incentive spirometer and flutter valve. -Continue steroids tapering process; has completed antibiotic therapy on 06/07/2022. -Appreciate pulmonology assistance. -Per cardiology recommendation Lasix x1 dose will be given.  Follow daily weights and strict I's and O's.  Patient with positive JVD on today's examination.    COPD with acute exacerbation ([place]) -Although the patient denies a history of COPD and has not had formal PFTs--> she endorses smoking over 30 years, 1/2 pack/day. -Quit smoking 7 years ago -Continue Yupelri/Brovana -Continue IV Solu-Medrol; statin tapering off process -Continue Pulmicort and pulmonary toiletry.   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 -EKG--sinus rhythm, T wave depression  V2-V5 -9/8 echo--EF 35-40%, apical HK, trivial MR/TR -9/10--repeat limited Echo-EF 35-40%, unchanged -9/11 started on Plavix after finalization of heparin drip. -Allergy planning the possibility for cardiac catheterization once medically stable and able to lay down flat. -Continue the use of bisoprolol and losartan.  Lobar pneumonia ([place]) -CTA chest as discussed above -MRSA+ -Patient has completed treatment with antibiotics. -PCT 0.64 -Tracheal aspiration without any growth. -Continue supportive care.  Hypernatremia - Continue to maintain adequate amount of free water through tube feedings -Follow electrolytes.  Impaired glucose tolerance -05/07/22 A1C--5.4 -Continue sliding scale insulin -Elevated CBGs in the setting of steroids usage and tube feedings.  Hypophosphatemia - Continue to follow electrolytes and replete as needed.  Hyperkalemia -Given lokelma -Electrolytes stable currently -Continue telemetry monitoring and follow electrolytes trend.  Cardiomyopathy ([place]) -9/8 echo--EF 35-40%, apical HK, trivial MR/TR -Discussed with cardiology>>suspect Takatsubo's  -finish 48 hours IV heparin 9/11>> patient has been started on Plavix.   Repeat limited Echo EF 35-40%, unchanged  Malnutrition of moderate degree -Advance diet -Count calories intake -Follow response.  Anemia -No signs of active blood loss -Hgb stable since admission -baseslin Hgb 10-11 -B12--674 -Folate 35.6 -Iron sat 37, ferritin 7392 -Continue to follow hemoglobin trend.  Chronic kidney disease, stage 3a ([place]) -Baseline creatinine 0.8-1.1 -Continue to follow renal function trend/stability.   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 -Continue risk factor modification. -No new focal deficits.  Essential hypertension -Continue bisoprolol (dose adjusted to 10 mg daily), initiation of losartan and one-time  dose of Lasix today. -Follow-up vital signs and heart healthy/low-sodium diet. -Appreciate assistance and recommendation by cardiology service.  Diarrhea: -Most likely functional in the setting of tube feedings and antibiotics. -Continue supportive care -Continue the use of Florastor and continue to maintain adequate hydration.  Family Communication: No family at bedside for updates.   Consultants:  pulm, cardiology   Code Status:  FULL    DVT Prophylaxis:  [place] Heparin      Procedures: As Listed in Progress Note Above   Antibiotics: Vanc 9/8>> Aztreonam 9/8>> Azithro 9/8>>     Subjective: Reporting shortness of breath, burning chest discomfort and tachypnea with exertion.  Patient requiring 3 L nasal cannula supplementation today.  Objective: Vitals:   06/07/22 2132 06/08/22 0612 06/08/22 0805 06/08/22 1300  BP: (!) 144/72 (!) 163/70  (!) 180/91  Pulse: 96 (!) 108 (!) 112 83  Resp: (!) 22 (!) 22 18 20   Temp: 99.2 F (37.3 C) 98.7 F (37.1 C)  98.6 F (37 C)  TempSrc: Oral     SpO2: 96% 100% 94% 100%  Weight:  46 kg    Height:        Intake/Output Summary (Last 24 hours) at 06/08/2022 1727 Last data filed at 06/08/2022 1300 Gross per 24 hour  Intake 200 ml  Output 2050 ml  Net -1850 ml   Weight change:   Exam: General exam: Alert, awake, oriented x 3; expressed feeling slightly short of breath today; positive palpitations and feeling her heart racing.  Patient also expressed chest burning sensation. Respiratory system: Decreased breath sounds at the bases; fine crackles.  No wheezing, no use of accessory muscles.  Slight tachypnea appreciated. Cardiovascular system: Irregular, no rubs, no gallops, positive JVD. Gastrointestinal system: Abdomen is nondistended, soft and nontender. No organomegaly or masses felt. Normal bowel sounds heard. Central nervous system: Alert and oriented. No  focal neurological deficits. Extremities: No cyanosis or clubbing. Skin: No  petechiae. Psychiatry: Judgement and insight appear normal. Mood & affect appropriate.   Data Reviewed: I have personally reviewed following labs and imaging studies  Basic Metabolic Panel: Recent Labs  Lab 06/02/22 0553 06/03/22 0436 06/07/22 0603 06/08/22 0921  NA 148* 146* 146* 145  K 4.5 3.6 3.3* 3.1*  CL 121* 118* 112* 115*  CO2 24 26 26  21*  GLUCOSE 199* 257* 97 111*  BUN 47* 44* 22 16  CREATININE 1.02* 0.84 0.82 0.71  CALCIUM 8.7* 8.7* 8.8* 8.3*  MG 2.0 1.9 2.0  --   PHOS  --  2.9  --   --    CBC: Recent Labs  Lab 06/04/22 0519 06/05/22 0536 06/06/22 0410 06/07/22 0658 06/08/22 0618  WBC 11.0* 16.0* 12.8* 19.8* 12.9*  HGB 10.0* 9.9* 9.4* 11.6* 11.5*  HCT 32.8* 31.9* 29.4* 36.8 36.7  MCV 112.3* 112.3* 109.3* 110.5* 112.6*  PLT 204 219 190 212 152   CBG: Recent Labs  Lab 06/08/22 0013 06/08/22 0601 06/08/22 0716 06/08/22 1109 06/08/22 1618  GLUCAP 144* 90 107* 133* 141*   HbA1C: No results for input(s): "HGBA1C" in the last 72 hours.  Urine analysis:    Component Value Date/Time   COLORURINE YELLOW 06/02/2022 1637   APPEARANCEUR CLEAR 06/02/2022 1637   LABSPEC 1.016 06/02/2022 1637   PHURINE 5.0 06/02/2022 1637   GLUCOSEU NEGATIVE 06/02/2022 1637   HGBUR NEGATIVE 06/02/2022 1637   BILIRUBINUR NEGATIVE 06/02/2022 1637   KETONESUR NEGATIVE 06/02/2022 1637   PROTEINUR NEGATIVE 06/02/2022 1637   UROBILINOGEN 1.0 07/16/2014 0244   NITRITE NEGATIVE 06/02/2022 1637   LEUKOCYTESUR NEGATIVE 06/02/2022 1637   Sepsis Labs:  Recent Results (from the past 240 hour(s))  Culture, Respiratory w Gram Stain     Status: None   Collection Time: 05/31/22  8:00 AM   Specimen: Tracheal Aspirate; Respiratory  Result Value Ref Range Status   Specimen Description   Final    TRACHEAL ASPIRATE Performed at [facility], [address] [place], [place] [address]    Special Requests   Final    NONE Performed at [facility], [address] [place], [place]  [address]    Gram Stain NO WBC SEEN NO ORGANISMS SEEN   Final   Culture   Final    NO GROWTH Performed at [facility], [address], [place], [place] [address]    Report Status 06/02/2022 FINAL  Final  Urine Culture     Status: None   Collection Time: 06/02/22  4:37 PM   Specimen: Urine, Catheterized  Result Value Ref Range Status   Specimen Description   Final    URINE, CATHETERIZED Performed at [facility], [address] [place], [place] [address]    Special Requests   Final    NONE Performed at [facility], [address], [place], [place] [address]    Culture   Final    NO GROWTH Performed at [facility], [address], [place], [place] [address]    Report Status 06/03/2022 FINAL  Final     Scheduled Meds:  arformoterol  15 mcg Nebulization BID   bisoprolol  10 mg Oral Daily   budesonide (PULMICORT) nebulizer solution  0.5 mg Nebulization BID   Chlorhexidine Gluconate Cloth  6 each Topical Q0600   citalopram  20 mg Oral Daily   clopidogrel  75 mg Oral Daily   feeding supplement  237 mL  Oral TID BM   heparin  5,000 Units Subcutaneous Q8H   insulin aspart  0-9 Units Subcutaneous Q4H   losartan  12.5 mg Oral Daily   mirtazapine  7.5 mg Oral QHS   mupirocin ointment   Nasal BID   pantoprazole (PROTONIX) IV  40 mg Intravenous Q12H   phosphorus  500 mg Oral BID   polyethylene glycol  17 g Oral Daily   [START ON 06/09/2022] predniSONE  40 mg Oral Q breakfast   revefenacin  175 mcg Nebulization Daily   rosuvastatin  20 mg Oral QPM   saccharomyces boulardii  250 mg Oral BID   sodium chloride flush  10-40 mL Intracatheter Q12H   Continuous Infusions:  sodium chloride Stopped (06/06/22 2139)    Procedures/Studies: DG Chest Port 1 View  Result Date: 06/07/2022 CLINICAL DATA:  Acute respiratory failure with hypoxia and hypercarbia. EXAM: PORTABLE CHEST 1 VIEW COMPARISON:  06/03/2022. FINDINGS: The heart size and mediastinal contours are stable. There is  atherosclerotic calcification of the aorta. Emphysematous changes are noted in the lungs. Coarse interstitial markings are present bilaterally and likely chronic. No consolidation, effusion, or pneumothorax. No acute osseous abnormality. IMPRESSION: 1. No active disease. 2. Emphysema. Electronically Signed   By: Brett Fairy M.D.   On: 06/07/2022 22:07   [place] EKG SITE RITE  Result Date: 06/04/2022 If Site Rite image not attached, placement could not be confirmed due to current cardiac rhythm.  [place] EKG SITE RITE  Result Date: 06/04/2022 If Site Rite image not attached, placement could not be confirmed due to current cardiac rhythm.  DG CHEST PORT 1 VIEW  Result Date: 06/03/2022 CLINICAL DATA:  NG tube placement.  Respiratory failure with hypoxia EXAM: PORTABLE CHEST 1 VIEW COMPARISON:  06/03/2022 FINDINGS: Endotracheal tube is 5.8 cm above the carina. NG tube enters the stomach. Heart is normal size. Increased markings in the lung bases likely reflects scarring. No acute confluent opacities or effusions. Emphysematous changes. No acute bony abnormality. IMPRESSION: COPD. Bibasilar scarring. Support devices as above. Electronically Signed   By: Rolm Baptise M.D.   On: 06/03/2022 22:37   DG Chest Port 1 View  Result Date: 06/03/2022 CLINICAL DATA:  Acute respiratory failure with hypoxia EXAM: PORTABLE CHEST 1 VIEW COMPARISON:  06/01/2022 FINDINGS: Endotracheal and enteric tubes are again identified. Enteric tube has likely retracted some given location of side port. 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 Similar left greater than right interstitial changes. No significant pleural effusion. No pneumothorax. Stable cardiomediastinal contours. IMPRESSION: Similar left greater than right patchy opacities superimposed on emphysema. Enteric tube has likely retracted as side port is now visible just below the gastroesophageal junction. Electronically Signed   By: Macy Mis M.D.   On: 06/03/2022 08:19    DG CHEST PORT 1 VIEW  Result Date: 06/01/2022 CLINICAL DATA:  Pneumonia EXAM: PORTABLE CHEST 1 VIEW COMPARISON:  Previous studies including the examination of 05/31/2022 FINDINGS: Tip of endotracheal tube is 7 cm above the carina. Enteric tube is noted traversing the esophagus. Cardiac size is within normal limits. There is slight improvement in infiltrates in left lung. There is possible slight worsening of infiltrate in right parahilar region. Bullous emphysema is seen in right upper lung field limiting evaluation for small pneumothorax. There is no significant pleural effusion. No definite pneumothorax is seen. IMPRESSION: There is decrease in extensive interstitial infiltrates in left lung. There is slight worsening of infiltrate in right parahilar region. Severe bullous emphysema in the right upper  lung field. Electronically Signed   By: Ernie Avena M.D.   On: 06/01/2022 09:17   ECHOCARDIOGRAM LIMITED  Result Date: 05/31/2022    ECHOCARDIOGRAM LIMITED REPORT   [name] Date of Exam: 05/31/2022 Medical Rec #:  [id number]         Height:       60.0 in Accession #:    [id number]        Weight:       90.2 lb Date of Birth:  Mar 13, 1951         BSA:          1.330 m Patient Age:    70 years          BP:           125/55 mmHg Patient Gender: F                 HR:           101 bpm. [facility] Procedure: Limited Echo Indications:    CHF-Acute Systolic I50.21  History:        Patient has prior history of Echocardiogram examinations, most                 recent 05/29/2022. Previous Myocardial Infarction and CAD, COPD                 and Stroke; Risk Factors:Hypertension and Dyslipidemia. Hx of                 CKD, COVID-10.  Sonographer:    Celesta Gentile RCS Referring Phys: [phone] DAVID TAT  Sonographer Comments: *Patient on mechanical ventilator at present. IMPRESSIONS  1. Left ventricular ejection fraction, by estimation, is 35 to 40%. The left ventricle has  moderately decreased function. The left ventricle demonstrates regional wall motion abnormalities (see scoring diagram/findings for description). The mid septal and all apical LV segments are akinetic (consistent with prior TTE).  2. Right ventricular systolic function is mildly reduced. The right ventricular size is normal.  3. The mitral valve is degenerative. Moderate mitral annular calcification. Comparison(s): Compared to prior TTE on 05/29/22, there is no significant change. FINDINGS  Left Ventricle: The mid septal and all apical LV segments are akinetic. Left ventricular ejection fraction, by estimation, is 35 to 40%. The left ventricle has moderately decreased function. The left ventricle demonstrates regional wall motion abnormalities. The left ventricular internal cavity size was normal in size. There is no left ventricular hypertrophy. Right Ventricle: The right ventricular size is normal. Right ventricular systolic function is mildly reduced. Pericardium: There is no evidence of pericardial effusion. Mitral Valve: The mitral valve is degenerative in appearance. There is mild thickening of the mitral valve leaflet(s). There is mild calcification of the mitral valve leaflet(s). Moderate mitral annular calcification. Aorta: The aortic root is normal in size and structure. LEFT VENTRICLE PLAX 2D LVIDd:         3.90 cm LVIDs:         3.30 cm LV PW:         0.90 cm LV IVS:        0.90 cm LVOT diam:     1.70 cm LVOT Area:     2.27 cm  LV Volumes (MOD) LV vol d, MOD A2C: 38.7 ml LV vol d, MOD A4C: 50.3 ml LV vol s, MOD A2C: 21.4 ml LV vol s, MOD A4C: 26.8 ml LV SV MOD A2C:  17.3 ml LV SV MOD A4C:     50.3 ml LV SV MOD BP:      20.7 ml LEFT ATRIUM         Index LA diam:    3.20 cm 2.41 cm/m   AORTA Ao Root diam: 2.50 cm  SHUNTS Systemic Diam: 1.70 cm Gwyndolyn Kaufman MD Electronically signed by Gwyndolyn Kaufman MD Signature Date/Time: 05/31/2022/1:31:58 PM    Final    DG Chest 1 View  Result Date:  05/31/2022 CLINICAL DATA:  Shortness of breath EXAM: CHEST  1 VIEW COMPARISON:  05/30/2022 FINDINGS: Endotracheal tube terminates 7 cm above the carina. Multifocal patchy left lung opacities, favoring pneumonia, possibly on the basis of aspiration. Right lung is essentially clear. No pleural effusion or pneumothorax. The heart is normal in size. Enteric tube courses into the stomach. Cholecystectomy clips. IMPRESSION: Endotracheal tube terminates 7 cm above the carina. Multifocal patchy left lung opacities, favoring pneumonia, possibly on the basis of aspiration. Electronically Signed   By: Julian Hy M.D.   On: 05/31/2022 03:54   DG CHEST PORT 1 VIEW  Result Date: 05/30/2022 CLINICAL DATA:  Endotracheal and orogastric tube. EXAM: PORTABLE CHEST 1 VIEW COMPARISON:  Chest CT 05/29/2022.  Chest x-ray 05/30/2009 3. FINDINGS: Endotracheal tube tip is 5 cm above the carina. Enteric tube tip is in the mid stomach. There are increasing central interstitial and airspace opacities throughout the left lung. Severe emphysematous changes are again seen in the right upper lobe. There is no pleural effusion or pneumothorax. No acute fractures are seen. Cardiomediastinal silhouette is within normal limits. IMPRESSION: 1. Endotracheal tube tip 5 cm above carina. 2. Enteric tube tip at the level of the mid stomach. 3. Increasing left lung interstitial and airspace opacities which may represent edema or infection. 4. Stable severe emphysema. Electronically Signed   By: Ronney Asters M.D.   On: 05/30/2022 19:50   DG CHEST PORT 1 VIEW  Result Date: 05/30/2022 CLINICAL DATA:  Respiratory failure, pneumonia EXAM: PORTABLE CHEST 1 VIEW COMPARISON:  Previous studies including chest radiograph done on 05/28/2022 and CT done on 05/29/2022 FINDINGS: There is diffuse increase in patchy interstitial and alveolar densities in both lungs, more so on the left side. There is relative sparing of right upper lung field due to underlying  bullous emphysema. There is blunting of both lateral CP angles. There is no pneumothorax. IMPRESSION: There is diffuse increase in interstitial and alveolar markings in both lungs, more so on the left side. Findings suggest asymmetric pulmonary edema or worsening multifocal bilateral pneumonia. Small bilateral pleural effusions. Electronically Signed   By: Elmer Picker M.D.   On: 05/30/2022 10:05   ECHOCARDIOGRAM COMPLETE  Result Date: 05/29/2022    ECHOCARDIOGRAM REPORT   [name] Date of Exam: 05/29/2022 Medical Rec #:  [id number]         Height:       60.0 in Accession #:    [id number]        Weight:       88.2 lb Date of Birth:  June 27, 1951         BSA:          1.318 m Patient Age:    54 years          BP:           143/66 mmHg Patient Gender: F  HR:           113 bpm. Exam Location:  [facility] Procedure: 2D Echo, Cardiac Doppler and Color Doppler Indications:    Elevated Troponin  History:        Patient has prior history of Echocardiogram examinations, most                 recent 04/14/2018. CAD and Previous Myocardial Infarction, COPD                 and Stroke; Risk Factors:Hypertension and Dyslipidemia. Hx of                 CKD, COVID-10.  Sonographer:    Alvino Chapel RCS Referring Phys: [phone] DAVID TAT  Sonographer Comments: ** Patient on BiPAP and moving Alot during echo. IMPRESSIONS  1. Left ventricular ejection fraction, by estimation, is 35 to 40%. The left ventricle has moderately decreased function. The left ventricle demonstrates regional wall motion abnormalities (see scoring diagram/findings for description). Left ventricular  diastolic parameters are indeterminate.  2. Right ventricular systolic function is mildly reduced. The right ventricular size is normal. There is moderately elevated pulmonary artery systolic pressure. The estimated right ventricular systolic pressure is [id number] mmHg.  3. The mitral valve is degenerative. Trivial mitral valve  regurgitation.  4. The aortic valve was not well visualized. Aortic valve regurgitation is not visualized. No aortic stenosis is present.  5. The inferior vena cava is dilated in size with <50% respiratory variability, suggesting right atrial pressure of 15 mmHg. Conclusion(s)/Recommendation(s): Normal systolic function at base with apical akinesis, differential includes LAD infarct vs stress induced cardiomyopathy. FINDINGS  Left Ventricle: Left ventricular ejection fraction, by estimation, is 35 to 40%. The left ventricle has moderately decreased function. The left ventricle demonstrates regional wall motion abnormalities. The left ventricular internal cavity size was normal in size. There is no left ventricular hypertrophy. Left ventricular diastolic parameters are indeterminate.  LV Wall Scoring: The mid and distal anterior septum, entire apex, and mid inferoseptal segment are akinetic. The anterior wall, antero-lateral wall, inferior wall, posterior wall, basal anteroseptal segment, and basal inferoseptal segment are normal. Right Ventricle: The right ventricular size is normal. Right vetricular wall thickness was not well visualized. Right ventricular systolic function is mildly reduced. There is moderately elevated pulmonary artery systolic pressure. The tricuspid regurgitant velocity is 3.00 m/s, and with an assumed right atrial pressure of 15 mmHg, the estimated right ventricular systolic pressure is [id number] mmHg. Left Atrium: Left atrial size was normal in size. Right Atrium: Right atrial size was normal in size. Pericardium: There is no evidence of pericardial effusion. Mitral Valve: The mitral valve is degenerative in appearance. Trivial mitral valve regurgitation. Tricuspid Valve: The tricuspid valve is normal in structure. Tricuspid valve regurgitation is trivial. Aortic Valve: The aortic valve was not well visualized. Aortic valve regurgitation is not visualized. No aortic stenosis is present. Pulmonic  Valve: The pulmonic valve was not well visualized. Pulmonic valve regurgitation is not visualized. Aorta: The aortic root is normal in size and structure. Venous: The inferior vena cava is dilated in size with less than 50% respiratory variability, suggesting right atrial pressure of 15 mmHg. IAS/Shunts: The interatrial septum was not well visualized.  LEFT VENTRICLE PLAX 2D LVIDd:         3.60 cm LVIDs:         2.50 cm LV PW:         0.90 cm LV IVS:  0.90 cm LVOT diam:     1.70 cm LV SV:         37 LV SV Index:   28 LVOT Area:     2.27 cm  LV Volumes (MOD) LV vol d, MOD A2C: 51.2 ml LV vol d, MOD A4C: 57.0 ml LV vol s, MOD A2C: 30.1 ml LV vol s, MOD A4C: 32.9 ml LV SV MOD A2C:     21.1 ml LV SV MOD A4C:     57.0 ml LV SV MOD BP:      24.6 ml RIGHT VENTRICLE TAPSE (M-mode): 1.2 cm LEFT ATRIUM             Index        RIGHT ATRIUM           Index LA diam:        2.60 cm 1.97 cm/m   RA Area:     10.40 cm LA Vol (A2C):   32.4 ml 24.58 ml/m  RA Volume:   25.30 ml  19.20 ml/m LA Vol (A4C):   32.5 ml 24.66 ml/m LA Biplane Vol: 35.2 ml 26.71 ml/m  AORTIC VALVE LVOT Vmax:   88.30 cm/s LVOT Vmean:  55.300 cm/s LVOT VTI:    0.164 m  AORTA Ao Root diam: 2.50 cm MITRAL VALVE                TRICUSPID VALVE MV Area (PHT): 7.16 cm     TR Peak grad:   36.0 mmHg MV Decel Time: 106 msec     TR Vmax:        300.00 cm/s MV E velocity: 136.00 cm/s                             SHUNTS                             Systemic VTI:  0.16 m                             Systemic Diam: 1.70 cm Oswaldo Milian MD Electronically signed by Oswaldo Milian MD Signature Date/Time: 05/29/2022/2:52:39 PM    Final    CT Angio Chest Pulmonary Embolism (PE) W or WO Contrast  Result Date: 05/29/2022 CLINICAL DATA:  Shortness of breath x2 weeks EXAM: CT ANGIOGRAPHY CHEST WITH CONTRAST TECHNIQUE: Multidetector CT imaging of the chest was performed using the standard protocol during bolus administration of intravenous contrast.  Multiplanar CT image reconstructions and MIPs were obtained to evaluate the vascular anatomy. RADIATION DOSE REDUCTION: This exam was performed according to the departmental dose-optimization program which includes automated exposure control, adjustment of the mA and/or kV according to patient size and/or use of iterative reconstruction technique. CONTRAST:  53mL OMNIPAQUE IOHEXOL 350 MG/ML SOLN COMPARISON:  Previous studies including the chest radiograph done on 05/28/2022 FINDINGS: Cardiovascular: There is homogeneous enhancement in thoracic aorta. There are no intraluminal filling defects in central pulmonary artery branches. There are scattered coronary artery calcifications. Mediastinum/Nodes: No significant lymphadenopathy seen. There are small pockets of air in right subclavian vein, possibly introduced during venipuncture. Lungs/Pleura: Centrilobular and panlobular emphysema is seen. 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 There is small patchy infiltrate in the lateral aspect of posterior segment of right upper lobe. There is thickening of interlobular septi in the lower lung fields.  There are patchy infiltrates in right middle lobe and both lower lobes. Small right pleural effusion is seen. There is minimal left pleural effusion. There is no pneumothorax. Upper Abdomen: There is 5.1 cm smooth marginated fluid density lesion in the upper pole of left kidney suggesting possible cyst. No follow-up is recommended. There are a few low-density lesions in liver measuring up to 11 mm in size which are not fully characterized, most likely cysts. No follow-up imaging is recommended. Musculoskeletal: No acute findings are seen. Review of the MIP images confirms the above findings. IMPRESSION: There is no evidence of pulmonary artery embolism. There is no evidence of thoracic aortic dissection. Scattered coronary artery calcifications are seen. 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  There are small patchy infiltrates in posterior segment of right upper lobe, right middle lobe and both lower lobes suggesting multifocal atelectasis/pneumonia. Bilateral pleural effusions, more so on the right side. There are low-density lesions in left kidney and liver, possibly cysts. No follow-up imaging is recommended. Electronically Signed   By: Elmer Picker M.D.   On: 05/29/2022 13:04   DG Chest Portable 1 View  Result Date: 05/28/2022 CLINICAL DATA:  Shortness of breath for 2 weeks.  COVID positive. EXAM: PORTABLE CHEST 1 VIEW COMPARISON:  05/02/2022 FINDINGS: Lungs are hyperinflated. Significant bullous changes at the RIGHT lung apex, similar to prior. There are no focal consolidations or pleural effusions. No pulmonary edema. IMPRESSION: Hyperinflation of the lungs. No evidence for acute pulmonary abnormality. Electronically Signed   By: Nolon Nations M.D.   On: 05/28/2022 17:12    Barton Dubois, MD  [facility]  If 7PM-7AM, please contact night-coverage [email or web address] Password [email or web address] 06/08/2022, 5:27 PM   LOS: 11 days

## 2022-06-09 DIAGNOSIS — I428 Other cardiomyopathies: Secondary | ICD-10-CM | POA: Diagnosis not present

## 2022-06-09 DIAGNOSIS — J9601 Acute respiratory failure with hypoxia: Secondary | ICD-10-CM | POA: Diagnosis not present

## 2022-06-09 DIAGNOSIS — I5021 Acute systolic (congestive) heart failure: Secondary | ICD-10-CM | POA: Diagnosis not present

## 2022-06-09 DIAGNOSIS — I10 Essential (primary) hypertension: Secondary | ICD-10-CM | POA: Diagnosis not present

## 2022-06-09 DIAGNOSIS — J9602 Acute respiratory failure with hypercapnia: Secondary | ICD-10-CM | POA: Diagnosis not present

## 2022-06-09 LAB — CBC
HCT: 35.4 % — ABNORMAL LOW (ref 36.0–46.0)
Hemoglobin: 11.3 g/dL — ABNORMAL LOW (ref 12.0–15.0)
MCH: 35.1 pg — ABNORMAL HIGH (ref 26.0–34.0)
MCHC: 31.9 g/dL (ref 30.0–36.0)
MCV: 109.9 fL — ABNORMAL HIGH (ref 80.0–100.0)
Platelets: 143 10*3/uL — ABNORMAL LOW (ref 150–400)
RBC: 3.22 MIL/uL — ABNORMAL LOW (ref 3.87–5.11)
RDW: 16.2 % — ABNORMAL HIGH (ref 11.5–15.5)
WBC: 14.5 10*3/uL — ABNORMAL HIGH (ref 4.0–10.5)
nRBC: 0 % (ref 0.0–0.2)

## 2022-06-09 LAB — GLUCOSE, CAPILLARY
Glucose-Capillary: 99 mg/dL (ref 70–99)
Glucose-Capillary: 107 mg/dL — ABNORMAL HIGH (ref 70–99)
Glucose-Capillary: 104 mg/dL — ABNORMAL HIGH (ref 70–99)
Glucose-Capillary: 170 mg/dL — ABNORMAL HIGH (ref 70–99)
Glucose-Capillary: 249 mg/dL — ABNORMAL HIGH (ref 70–99)

## 2022-06-09 LAB — BASIC METABOLIC PANEL
Anion gap: 6 (ref 5–15)
BUN: 17 mg/dL (ref 8–23)
CO2: 30 mmol/L (ref 22–32)
Calcium: 9 mg/dL (ref 8.9–10.3)
Chloride: 112 mmol/L — ABNORMAL HIGH (ref 98–111)
Creatinine, Ser: 0.79 mg/dL (ref 0.44–1.00)
GFR, Estimated: >60 mL/min (ref >60)
Glucose, Bld: 98 mg/dL (ref 70–99)
Potassium: 3.2 mmol/L — ABNORMAL LOW (ref 3.5–5.1)
Sodium: 148 mmol/L — ABNORMAL HIGH (ref 135–145)

## 2022-06-09 LAB — MAGNESIUM: Magnesium: 1.7 mg/dL (ref 1.7–2.4)

## 2022-06-09 LAB — TROPONIN I (HIGH SENSITIVITY): Troponin I (High Sensitivity): 107 ng/L (ref <18)

## 2022-06-09 LAB — BRAIN NATRIURETIC PEPTIDE: B Natriuretic Peptide: 1803 pg/mL — ABNORMAL HIGH (ref 0.0–100.0)

## 2022-06-09 MED ORDER — POTASSIUM CHLORIDE 20 MEQ PO PACK
40.0000 meq | PACK | Freq: Four times a day (QID) | ORAL | Status: AC
  Administered 2022-06-09: 40 meq via ORAL
  Filled 2022-06-09 (×2): qty 2

## 2022-06-09 MED ORDER — FUROSEMIDE 10 MG/ML IJ SOLN
40.0000 mg | Freq: Once | INTRAMUSCULAR | Status: AC
  Administered 2022-06-09: 40 mg via INTRAVENOUS
  Filled 2022-06-09: qty 4

## 2022-06-09 MED ORDER — SPIRONOLACTONE 12.5 MG HALF TABLET
12.5000 mg | ORAL_TABLET | Freq: Every day | ORAL | Status: DC
  Administered 2022-06-09: 12.5 mg via ORAL
  Filled 2022-06-09 (×2): qty 1

## 2022-06-09 MED ORDER — NITROGLYCERIN 0.4 MG SL SUBL
SUBLINGUAL_TABLET | SUBLINGUAL | Status: AC
  Filled 2022-06-09: qty 1

## 2022-06-09 MED ORDER — NITROGLYCERIN 0.4 MG SL SUBL
0.4000 mg | SUBLINGUAL_TABLET | SUBLINGUAL | Status: DC | PRN
  Administered 2022-06-09: 0.4 mg via SUBLINGUAL

## 2022-06-09 MED ORDER — MAGNESIUM SULFATE 2 GM/50ML IV SOLN
2.0000 g | Freq: Once | INTRAVENOUS | Status: AC
  Administered 2022-06-09: 2 g via INTRAVENOUS
  Filled 2022-06-09: qty 50

## 2022-06-09 NOTE — Progress Notes (Addendum)
Rounding Note    Patient Name: [name] Date of Encounter: 06/09/2022  [place] Cardiologist: Donato Heinz, MD   Subjective   No complaints  Inpatient Medications    Scheduled Meds:  arformoterol  15 mcg Nebulization BID   bisoprolol  10 mg Oral Daily   budesonide (PULMICORT) nebulizer solution  0.5 mg Nebulization BID   Chlorhexidine Gluconate Cloth  6 each Topical Q0600   citalopram  20 mg Oral Daily   clopidogrel  75 mg Oral Daily   feeding supplement  237 mL Oral TID BM   heparin  5,000 Units Subcutaneous Q8H   insulin aspart  0-9 Units Subcutaneous Q4H   losartan  12.5 mg Oral Daily   mirtazapine  7.5 mg Oral QHS   mupirocin ointment   Nasal BID   pantoprazole (PROTONIX) IV  40 mg Intravenous Q12H   phosphorus  500 mg Oral BID   polyethylene glycol  17 g Oral Daily   predniSONE  40 mg Oral Q breakfast   revefenacin  175 mcg Nebulization Daily   rosuvastatin  20 mg Oral QPM   saccharomyces boulardii  250 mg Oral BID   sodium chloride flush  10-40 mL Intracatheter Q12H   Continuous Infusions:  sodium chloride Stopped (06/06/22 2139)   PRN Meds: sodium chloride, acetaminophen **OR** acetaminophen, albuterol, diphenoxylate-atropine, fentaNYL, fentaNYL (SUBLIMAZE) injection, haloperidol lactate, hydrALAZINE, metoprolol tartrate, ondansetron **OR** ondansetron (ZOFRAN) IV, mouth rinse, polyethylene glycol, polyvinyl alcohol, sodium chloride flush   Vital Signs    Vitals:   06/08/22 2020 06/08/22 2050 06/09/22 0333 06/09/22 0850  BP:  (!) 166/80 (!) 166/75   Pulse:  96 (!) 102 (!) 101  Resp:  (!) 21 16 20   Temp:  99.6 F (37.6 C) 97.8 F (36.6 C)   TempSrc:   Axillary   SpO2: 95% 99% 97% 98%  Weight:   44.6 kg   Height:        Intake/Output Summary (Last 24 hours) at 06/09/2022 0933 Last data filed at 06/09/2022 0500 Gross per 24 hour  Intake 665.42 ml  Output 2100 ml  Net -1434.58 ml      06/09/2022    3:33 AM  06/08/2022    6:12 AM 06/05/2022    5:00 AM  Last 3 Weights  Weight (lbs) 98 lb 5.2 oz 101 lb 6.6 oz 98 lb 8.7 oz  Weight (kg) 44.6 kg 46 kg 44.7 kg      Telemetry    SR - Personally Reviewed  ECG    N/a - Personally Reviewed  Physical Exam   GEN: No acute distress.   Neck: will not cooperate with exam Cardiac: RRR, no murmurs, rubs, or gallops.  Respiratory:mild crackles bases GI: Soft, nontender, non-distended  MS: No edema; No deformity. Neuro:  Nonfocal  Psych: Normal affect   Labs    High Sensitivity Troponin:   Recent Labs  Lab 05/29/22 0438 05/29/22 0811 05/29/22 1012 06/08/22 0921 06/08/22 1154  TROPONINIHS 478* 400* 368* 133* 113*     Chemistry Recent Labs  Lab 06/03/22 0436 06/07/22 0603 06/08/22 0921 06/09/22 0406  NA 146* 146* 145 148*  K 3.6 3.3* 3.1* 3.2*  CL 118* 112* 115* 112*  CO2 26 26 21* 30  GLUCOSE 257* 97 111* 98  BUN 44* 22 16 17   CREATININE 0.84 0.82 0.71 0.79  CALCIUM 8.7* 8.8* 8.3* 9.0  MG 1.9 2.0  --  1.7  GFRNONAA >60 >60 >60 >60  ANIONGAP  2* 8 9 6     Lipids No results for input(s): "CHOL", "TRIG", "HDL", "LABVLDL", "LDLCALC", "CHOLHDL" in the last 168 hours.  Hematology Recent Labs  Lab 06/07/22 0658 06/08/22 0618 06/09/22 0406  WBC 19.8* 12.9* 14.5*  RBC 3.33* 3.26* 3.22*  HGB 11.6* 11.5* 11.3*  HCT 36.8 36.7 35.4*  MCV 110.5* 112.6* 109.9*  MCH 34.8* 35.3* 35.1*  MCHC 31.5 31.3 31.9  RDW 16.7* 16.4* 16.2*  PLT 212 152 143*   Thyroid No results for input(s): "TSH", "FREET4" in the last 168 hours.  BNP Recent Labs  Lab 06/09/22 0406  BNP 1,803.0*    DDimer No results for input(s): "DDIMER" in the last 168 hours.   Radiology    DG Chest Port 1 View  Result Date: 06/07/2022 CLINICAL DATA:  Acute respiratory failure with hypoxia and hypercarbia. EXAM: PORTABLE CHEST 1 VIEW COMPARISON:  06/03/2022. FINDINGS: The heart size and mediastinal contours are stable. There is atherosclerotic calcification of the  aorta. Emphysematous changes are noted in the lungs. Coarse interstitial markings are present bilaterally and likely chronic. No consolidation, effusion, or pneumothorax. No acute osseous abnormality. IMPRESSION: 1. No active disease. 2. Emphysema. Electronically Signed   By: Brett Fairy M.D.   On: 06/07/2022 22:07    Cardiac Studies    05/31/22 TTE: IMPRESSIONS     1. Left ventricular ejection fraction, by estimation, is 35 to 40%. The  left ventricle has moderately decreased function. The left ventricle  demonstrates regional wall motion abnormalities (see scoring  diagram/findings for description). The mid septal  and all apical LV segments are akinetic (consistent with prior TTE).   2. Right ventricular systolic function is mildly reduced. The right  ventricular size is normal.   3. The mitral valve is degenerative. Moderate mitral annular  calcification.   Comparison(s): Compared to prior TTE on 05/29/22, there is no significant  change.    05/29/22 TTE: IMPRESSIONS    1. Left ventricular ejection fraction, by estimation, is 35 to 40%. The  left ventricle has moderately decreased function. The left ventricle  demonstrates regional wall motion abnormalities (see scoring  diagram/findings for description). Left ventricular   diastolic parameters are indeterminate.   2. Right ventricular systolic function is mildly reduced. The right  ventricular size is normal. There is moderately elevated pulmonary artery  systolic pressure. The estimated right ventricular systolic pressure is  16.1 mmHg.   3. The mitral valve is degenerative. Trivial mitral valve regurgitation.   4. The aortic valve was not well visualized. Aortic valve regurgitation  is not visualized. No aortic stenosis is present.   5. The inferior vena cava is dilated in size with <50% respiratory  variability, suggesting right atrial pressure of 15 mmHg.   Conclusion(s)/Recommendation(s): Normal systolic function at  base with  apical akinesis, differential includes LAD infarct vs stress induced  cardiomyopathy.   Patient Profile      05/31/22 TTE: IMPRESSIONS     1. Left ventricular ejection fraction, by estimation, is 35 to 40%. The  left ventricle has moderately decreased function. The left ventricle  demonstrates regional wall motion abnormalities (see scoring  diagram/findings for description). The mid septal  and all apical LV segments are akinetic (consistent with prior TTE).   2. Right ventricular systolic function is mildly reduced. The right  ventricular size is normal.   3. The mitral valve is degenerative. Moderate mitral annular  calcification.   Comparison(s): Compared to prior TTE on 05/29/22, there is no significant  change.  05/29/22 TTE: IMPRESSIONS    1. Left ventricular ejection fraction, by estimation, is 35 to 40%. The  left ventricle has moderately decreased function. The left ventricle  demonstrates regional wall motion abnormalities (see scoring  diagram/findings for description). Left ventricular   diastolic parameters are indeterminate.   2. Right ventricular systolic function is mildly reduced. The right  ventricular size is normal. There is moderately elevated pulmonary artery  systolic pressure. The estimated right ventricular systolic pressure is  99991111 mmHg.   3. The mitral valve is degenerative. Trivial mitral valve regurgitation.   4. The aortic valve was not well visualized. Aortic valve regurgitation  is not visualized. No aortic stenosis is present.   5. The inferior vena cava is dilated in size with <50% respiratory  variability, suggesting right atrial pressure of 15 mmHg.   Conclusion(s)/Recommendation(s): Normal systolic function at base with  apical akinesis, differential includes LAD infarct vs stress induced  cardiomyopathy.  Assessment & Plan    1.HFrEF - new diagnosis this admission - 05/29/22 echo LVEF 35-40%, apical akinesis, indet  diastolic fxn, mild RV dysfunction, mod pulm HTN PASP 51,  - 05/31/22 limited echo LVE 35-40%, apex akinetic.  - suggestive of stress induced CM but cannot rule out ischemic CM.    - ACEi allergy with swelling, not elgible for ARNI. ARB reasonable as alternative as cross reactivity is low, started losartan 12.5mg  daily yesterday. She is on bisoprolol 10mg  daily. Start aldactone 12.5mg  daily, likely SGLT2i tomorrow.    - some signs of fluid overload yesterday. Dosed IV lasix 40mg  x 1. Neg 1.3 L yesterday, renal function is stable. BNP 1803 this AM Redose IV lasix 40mg  x 1 today.     2. History of CAD - PTCA to RCA in 1997. NSTEMI 2004 with RCA CTO managed medically - trop peak 478 this admit in setting of respiratory failure on vent initially - completed 48 hrs heparin   - ASA allergy, she is on plavix. Also on bisoprolol, losartan started. Start crestor 20mg  daily.  -yesterday new deep inverted anterior/anterolateral [place] - has some chest burning pain that lasts for hours at a time, worst with taking pills and better with maalox. No other chest pains. Enzymes continue to trend down.    - plan had been cath once more medically stable, note she has contrast allergy.  - remains quite frail after this admit with extensive severe medical issues, I'm not quite sure she will be viable for cath. Some confusion and combativeness this AM. If she maintains how she is today I don't see her being a cath candidate. Continue to diurese, follow medical status next few days, may be just medical therapy.    3. Respiratory failure - secondary to recent COVID infection, COPD exacerbation, pneumonia - initially on vent, extubated 06/04/22.  - completed antibiotic course, remains on IV steroids and nebs.    4. Sinus tachycardia - variable heart rates, at times low 100s up to 130s-140s. Tele shows sinus tach - remains on steroids and nebs - improved rates today.     For questions or updates, please contact  [place] Please consult [email or web address] for contact info under        Signed, Carlyle Dolly, MD  06/09/2022, 9:33 AM

## 2022-06-09 NOTE — TOC Progression Note (Signed)
Transition of Care [facility]) - Progression Note    Patient Details  Name: [name] MRN: [id number] Date of Birth: [birth year]  Transition of Care [facility]) CM/SW Contact  Ihor Gully, LCSW Phone Number: 06/09/2022, 4:44 PM  Clinical Narrative:    Son provided bed offers and selected [facility].    Expected Discharge Plan: [place] Barriers to Discharge: Continued Medical Work up  Expected Discharge Plan and Services Expected Discharge Plan: [place] arrangements for the past 2 months: Single Family Home                                       Social Determinants of Health (SDOH) Interventions    Readmission Risk Interventions     No data to display

## 2022-06-09 NOTE — Progress Notes (Signed)
PROGRESS NOTE  [name] [id number] DOB: [birth year] DOA: 05/28/2022 PCP: Carrolyn Meiers, MD  Brief History:  71 year old female with a history of coronary disease, hypertension, hyperlipidemia, functional constipation, B12 and folate deficiency, anxiety, and prior stroke presenting with shortness of breath and respiratory failure.  The patient was recently mid to the hospital from 05/02/2022 to 05/09/2022 when she had acute respiratory failure secondary to COVID-19.  During that hospitalization, the patient was also noted to have colitis for which she was treated with levofloxacin and metronidazole.  She completed a course of antibiotics prior to discharge.  She was discharged home with a prednisone taper. She states that she has had some shortness of breath since discharge from the hospital, but this has significantly worsened in the 24 hours prior to admission.  Upon arrival to the emergency department, the patient was noted to have respiratory distress patient was placed on BiPAP. Repeat ABG on BiPAP did not show significant improvement.  There was plans for intubation, but the patient was given Ativan with improvement clinically.  She remained on BiPAP overnight.  Notably, the patient states that she started having some burning chest discomfort on the morning of 05/28/2022.  She states that it somewhat improved while she was on BiPAP, but continues to have the chest discomfort.  She was taken off of BiPAP in the morning of 05/29/2022 and stated that she had some worsening of her burning chest discomfort.  She developed some increased shortness of breath resulting in placement back on BiPAP on the morning of 05/29/22.  Troponins were noted to be 375>> 478.  D-dimer was also noted to be elevated 3.97.  The patient was started on IV heparin. The patient was given Solu-Medrol 125 mg IV, morphine 1 mg IV and placed back on BiPAP.  Repeat EKG showed sinus tachycardia with T wave  inversion V2-V5.  Troponins continue to be cycled. cardiology was consulted to assist with management.   On the evening of 05/30/2022, the patient developed respiratory distress and hypoxia on BiPAP.  She remained agitated despite pharmacologic and nonpharmacologic interventions.  After discussion with the patient's family, decision was made to intubate the patient.  Repeat limited echocardiogram was ordered for 05/31/22.     Assessment and Plan: * Acute respiratory failure with hypoxia and hypercarbia (HCC) - Presented with respiratory distress with oxygen saturation 77% on room air.  Appears to be secondary to COPD exacerbation and pneumonia -Patient failed the use of BiPAP and in the requiring to be intubated and mechanically ventilated.  (05/30/2022) -CTA chest--neg PE, severe emphysema; multifocal patchy infiltrates RUL, RLL, LLL -Patient has been extubated (06/04/2022);  -Currently on 2 L nasal cannula supplementation and reporting no significant shortness of breath -Continue current bronchodilator management and follow clinical response. -Continue t the use of incentive spirometer and flutter valve. -Continue steroids tapering process; has completed antibiotic therapy on 06/07/2022. -Appreciate pulmonology assistance. -Per cardiology recommendation Lasix IV 40 mg x 1 dose will be repeated today; patient demonstrating elevated BNP.  Still using 2 L nasal cannula supplementation.  No frank crackles on exam. -Continue to follow daily weights and strict I's and O's.    COPD with acute exacerbation ([place]) -Although the patient denies a history of COPD and has not had formal PFTs--> she endorses smoking over 30 years, 1/2 pack/day. -Quit smoking 7 years ago -Continue Yupelri/Brovana -Continue IV Solu-Medrol; statin tapering off process -Continue Pulmicort and pulmonary toiletry.  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 -EKG--sinus rhythm, T wave depression V2-V5 -9/8 echo--EF 35-40%, apical HK, trivial MR/TR -9/10--repeat limited Echo-EF 35-40%, unchanged -9/11 started on Plavix after finalization of heparin drip. -Allergy planning the possibility for cardiac catheterization once medically stable and able to lay down flat. -Continue the use of bisoprolol and losartan. -Continue to follow cardiology service recommendations.  Based on patient's mood fluctuation and intermittent episode of combativeness/agitation they are considering the possibility of just medical management.  Lobar pneumonia ([place]) -CTA chest as discussed above -MRSA+ -Patient has completed treatment with antibiotics. -PCT 0.64 -Tracheal aspiration without any growth. -Continue supportive care.  Hypernatremia - Continue to maintain adequate amount of free water intake. -Follow electrolytes. -Sodium 148.  Impaired glucose tolerance -05/07/22 A1C--5.4 -Continue sliding scale insulin -Elevated CBGs in the setting of steroids usage and tube feedings.  Hypophosphatemia - Continue to follow electrolytes and replete as needed.  Hyperkalemia -Given lokelma and now receiving intermittent dosages of diuretics. -Electrolytes stable currently -Continue telemetry monitoring and follow electrolytes trend. -Potassium down to 3.2; will replete and follow trend.  Cardiomyopathy ([place]) -9/8 echo--EF 35-40%, apical HK, trivial MR/TR -Discussed with cardiology>>suspect Takatsubo's  -finish 48 hours IV heparin 9/11>> patient has been started on Plavix.   Repeat limited Echo EF 35-40%, unchanged  Malnutrition of moderate degree -Advance diet -Count calories intake -Follow response.  Anemia -No signs of active blood loss -Hgb stable since admission -baseslin Hgb 10-11 -B12--674 -Folate 35.6 -Iron sat 37, ferritin 7392 -Continue to follow hemoglobin trend.  Chronic kidney disease,  stage 3a ([place]) -Baseline creatinine 0.8-1.1 -Continue to follow renal function trend/stability.   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 -Continue risk factor modification. -No new focal deficits.  Essential hypertension -Continue bisoprolol (dose adjusted to 10 mg daily), initiation of losartan and one-time dose of Lasix today. -Follow-up vital signs and heart healthy/low-sodium diet. -Appreciate assistance and recommendation by cardiology service.  Diarrhea: -Most likely functional in the setting of tube feedings and antibiotics. -Continue supportive care -Continue the use of Florastor and continue to maintain adequate hydration. -No significant events reported.  Family Communication: No family at bedside for updates.   Consultants:  pulm, cardiology   Code Status:  FULL    DVT Prophylaxis:  [place] Heparin      Procedures: As Listed in Progress Note Above   Antibiotics: Vanc 9/8>>9/17 Aztreonam 9/8>>9/17 Azithro 9/8>>9/15     Subjective: Reports improvement in her breathing, no chest pain, no nausea, no vomiting.  Nursing staff reporting intermittent episode of combativeness and agitation.  Patient with elevated BNP.  Objective: Vitals:   06/09/22 0333 06/09/22 0850 06/09/22 1032 06/09/22 1243  BP: (!) 166/75  (!) 172/90 (!) 160/67  Pulse: (!) 102 (!) 101 95 97  Resp: 16 20  20   Temp: 97.8 F (36.6 C)   98.4 F (36.9 C)  TempSrc: Axillary   Axillary  SpO2: 97% 98% 100% 100%  Weight: 44.6 kg     Height:        Intake/Output Summary (Last 24 hours) at 06/09/2022 1535 Last data filed at 06/09/2022 1200 Gross per 24 hour  Intake 585.42 ml  Output 800 ml  Net -214.58 ml   Weight change: -1.4 kg  Exam: General exam: Alert, awake, oriented x 2; was able to follow commands on my examination; no chest pain, no  nausea, no vomiting.  2 L nasal cannula supplementation in  place. Respiratory system: Improved air movement bilaterally, no frank crackles, no wheezing, no using accessory muscles. Cardiovascular system: Irregular, no rubs, no gallops, no JVD. Gastrointestinal system: Abdomen is nondistended, soft and nontender. No organomegaly or masses felt. Normal bowel sounds heard. Central nervous system: Alert and oriented. No focal neurological deficits. Extremities: No cyanosis, clubbing or edema. Skin: No petechiae. Psychiatry: Judgement and insight appear normal.  Stable mood currently.  Nursing staff reporting intermittent episode of agitation and combativeness.  Data Reviewed: I have personally reviewed following labs and imaging studies  Basic Metabolic Panel: Recent Labs  Lab 06/03/22 0436 06/07/22 0603 06/08/22 0921 06/09/22 0406  NA 146* 146* 145 148*  K 3.6 3.3* 3.1* 3.2*  CL 118* 112* 115* 112*  CO2 26 26 21* 30  GLUCOSE 257* 97 111* 98  BUN 44* 22 16 17   CREATININE 0.84 0.82 0.71 0.79  CALCIUM 8.7* 8.8* 8.3* 9.0  MG 1.9 2.0  --  1.7  PHOS 2.9  --   --   --    CBC: Recent Labs  Lab 06/05/22 0536 06/06/22 0410 06/07/22 0658 06/08/22 0618 06/09/22 0406  WBC 16.0* 12.8* 19.8* 12.9* 14.5*  HGB 9.9* 9.4* 11.6* 11.5* 11.3*  HCT 31.9* 29.4* 36.8 36.7 35.4*  MCV 112.3* 109.3* 110.5* 112.6* 109.9*  PLT 219 190 212 152 143*   CBG: Recent Labs  Lab 06/08/22 2049 06/08/22 2345 06/09/22 0330 06/09/22 0752 06/09/22 1157  GLUCAP 146* 99 99 107* 104*   Urine analysis:    Component Value Date/Time   COLORURINE YELLOW 06/02/2022 [place] 06/02/2022 1637   LABSPEC 1.016 06/02/2022 1637   PHURINE 5.0 06/02/2022 1637   GLUCOSEU NEGATIVE 06/02/2022 1637   HGBUR NEGATIVE 06/02/2022 1637   BILIRUBINUR NEGATIVE 06/02/2022 1637   KETONESUR NEGATIVE 06/02/2022 1637   PROTEINUR NEGATIVE 06/02/2022 1637   UROBILINOGEN 1.0 07/16/2014 0244   NITRITE NEGATIVE 06/02/2022 1637   LEUKOCYTESUR NEGATIVE 06/02/2022 1637    Sepsis Labs:  Recent Results (from the past 240 hour(s))  Culture, Respiratory w Gram Stain     Status: None   Collection Time: 05/31/22  8:00 AM   Specimen: Tracheal Aspirate; Respiratory  Result Value Ref Range Status   Specimen Description   Final    TRACHEAL ASPIRATE Performed at [facility], [address] [place], [place] [address]    Special Requests   Final    NONE Performed at [facility], [address], [place], [place] [address]    Gram Stain NO WBC SEEN NO ORGANISMS SEEN   Final   Culture   Final    NO GROWTH Performed at [facility], [place] [address], [place], [place] [address]    Report Status 06/02/2022 FINAL  Final  Urine Culture     Status: None   Collection Time: 06/02/22  4:37 PM   Specimen: Urine, Catheterized  Result Value Ref Range Status   Specimen Description   Final    URINE, CATHETERIZED Performed at [facility], [address], [place], [place] [address]    Special Requests   Final    NONE Performed at [facility], [address] [place], [place] [address]    Culture   Final    NO GROWTH Performed at [facility], [place] [address], [place], [place] [address]    Report Status 06/03/2022 FINAL  Final     Scheduled Meds:  arformoterol  15 mcg Nebulization BID   bisoprolol  10 mg Oral Daily   budesonide (PULMICORT) nebulizer solution  0.5 mg Nebulization BID   Chlorhexidine Gluconate Cloth  6 each Topical Q0600   citalopram  20 mg Oral Daily   clopidogrel  75 mg Oral Daily   feeding supplement  237 mL Oral TID BM   heparin  5,000 Units Subcutaneous Q8H   insulin aspart  0-9 Units Subcutaneous Q4H   losartan  12.5 mg Oral Daily   mirtazapine  7.5 mg Oral QHS   mupirocin ointment   Nasal BID   pantoprazole (PROTONIX) IV  40 mg Intravenous Q12H   phosphorus  500 mg Oral BID   polyethylene glycol  17 g Oral Daily   potassium chloride  40 mEq Oral Q6H   predniSONE  40 mg Oral Q breakfast   revefenacin  175 mcg Nebulization  Daily   rosuvastatin  20 mg Oral QPM   saccharomyces boulardii  250 mg Oral BID   sodium chloride flush  10-40 mL Intracatheter Q12H   spironolactone  12.5 mg Oral Daily   Continuous Infusions:  sodium chloride Stopped (06/06/22 2139)    Procedures/Studies: DG Chest Port 1 View  Result Date: 06/07/2022 CLINICAL DATA:  Acute respiratory failure with hypoxia and hypercarbia. EXAM: PORTABLE CHEST 1 VIEW COMPARISON:  06/03/2022. FINDINGS: The heart size and mediastinal contours are stable. There is atherosclerotic calcification of the aorta. Emphysematous changes are noted in the lungs. Coarse interstitial markings are present bilaterally and likely chronic. No consolidation, effusion, or pneumothorax. No acute osseous abnormality. IMPRESSION: 1. No active disease. 2. Emphysema. Electronically Signed   By: Brett Fairy M.D.   On: 06/07/2022 22:07   [place] EKG SITE RITE  Result Date: 06/04/2022 If Site Rite image not attached, placement could not be confirmed due to current cardiac rhythm.  [place] EKG SITE RITE  Result Date: 06/04/2022 If Site Rite image not attached, placement could not be confirmed due to current cardiac rhythm.  DG CHEST PORT 1 VIEW  Result Date: 06/03/2022 CLINICAL DATA:  NG tube placement.  Respiratory failure with hypoxia EXAM: PORTABLE CHEST 1 VIEW COMPARISON:  06/03/2022 FINDINGS: Endotracheal tube is 5.8 cm above the carina. NG tube enters the stomach. Heart is normal size. Increased markings in the lung bases likely reflects scarring. No acute confluent opacities or effusions. Emphysematous changes. No acute bony abnormality. IMPRESSION: COPD. Bibasilar scarring. Support devices as above. Electronically Signed   By: Rolm Baptise M.D.   On: 06/03/2022 22:37   DG Chest Port 1 View  Result Date: 06/03/2022 CLINICAL DATA:  Acute respiratory failure with hypoxia EXAM: PORTABLE CHEST 1 VIEW COMPARISON:  06/01/2022 FINDINGS: Endotracheal and enteric tubes are again identified.  Enteric tube has likely retracted some given location of side port. 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 Similar left greater than right interstitial changes. No significant pleural effusion. No pneumothorax. Stable cardiomediastinal contours. IMPRESSION: Similar left greater than right patchy opacities superimposed on emphysema. Enteric tube has likely retracted as side port is now visible just below the gastroesophageal junction. Electronically Signed   By: Macy Mis M.D.   On: 06/03/2022 08:19   DG CHEST PORT 1 VIEW  Result Date: 06/01/2022 CLINICAL DATA:  Pneumonia EXAM: PORTABLE CHEST 1 VIEW COMPARISON:  Previous studies including the examination of 05/31/2022 FINDINGS: Tip of endotracheal tube is 7 cm above the carina. Enteric tube is noted traversing the esophagus. Cardiac size is within normal limits. There is slight  improvement in infiltrates in left lung. There is possible slight worsening of infiltrate in right parahilar region. Bullous emphysema is seen in right upper lung field limiting evaluation for small pneumothorax. There is no significant pleural effusion. No definite pneumothorax is seen. IMPRESSION: There is decrease in extensive interstitial infiltrates in left lung. There is slight worsening of infiltrate in right parahilar region. Severe bullous emphysema in the right upper lung field. Electronically Signed   By: Elmer Picker M.D.   On: 06/01/2022 09:17   ECHOCARDIOGRAM LIMITED  Result Date: 05/31/2022    ECHOCARDIOGRAM LIMITED REPORT   Patient Name:   HILMA CALZADO Date of Exam: 05/31/2022 Medical Rec #:  [id number]         Height:       60.0 in Accession #:    [id number]        Weight:       90.2 lb Date of Birth:  Jun 23, 1951         BSA:          1.330 m Patient Age:    9 years          BP:           125/55 mmHg Patient Gender: F                 HR:           101 bpm. Exam Location:  [facility] Procedure: Limited Echo Indications:    CHF-Acute Systolic [id number]   History:        Patient has prior history of Echocardiogram examinations, most                 recent 05/29/2022. Previous Myocardial Infarction and CAD, COPD                 and Stroke; Risk Factors:Hypertension and Dyslipidemia. Hx of                 CKD, COVID-10.  Sonographer:    Alvino Chapel RCS Referring Phys: [phone] DAVID TAT  Sonographer Comments: *Patient on mechanical ventilator at present. IMPRESSIONS  1. Left ventricular ejection fraction, by estimation, is 35 to 40%. The left ventricle has moderately decreased function. The left ventricle demonstrates regional wall motion abnormalities (see scoring diagram/findings for description). The mid septal and all apical LV segments are akinetic (consistent with prior TTE).  2. Right ventricular systolic function is mildly reduced. The right ventricular size is normal.  3. The mitral valve is degenerative. Moderate mitral annular calcification. Comparison(s): Compared to prior TTE on 05/29/22, there is no significant change. FINDINGS  Left Ventricle: The mid septal and all apical LV segments are akinetic. Left ventricular ejection fraction, by estimation, is 35 to 40%. The left ventricle has moderately decreased function. The left ventricle demonstrates regional wall motion abnormalities. The left ventricular internal cavity size was normal in size. There is no left ventricular hypertrophy. Right Ventricle: The right ventricular size is normal. Right ventricular systolic function is mildly reduced. Pericardium: There is no evidence of pericardial effusion. Mitral Valve: The mitral valve is degenerative in appearance. There is mild thickening of the mitral valve leaflet(s). There is mild calcification of the mitral valve leaflet(s). Moderate mitral annular calcification. Aorta: The aortic root is normal in size and structure. LEFT VENTRICLE PLAX 2D LVIDd:         3.90 cm LVIDs:         3.30 cm LV PW:  0.90 cm LV IVS:        0.90 cm LVOT diam:     1.70 cm LVOT  Area:     2.27 cm  LV Volumes (MOD) LV vol d, MOD A2C: 38.7 ml LV vol d, MOD A4C: 50.3 ml LV vol s, MOD A2C: 21.4 ml LV vol s, MOD A4C: 26.8 ml LV SV MOD A2C:     17.3 ml LV SV MOD A4C:     50.3 ml LV SV MOD BP:      20.7 ml LEFT ATRIUM         Index LA diam:    3.20 cm 2.41 cm/m   AORTA Ao Root diam: 2.50 cm  SHUNTS Systemic Diam: 1.70 cm Gwyndolyn Kaufman MD Electronically signed by Gwyndolyn Kaufman MD Signature Date/Time: 05/31/2022/1:31:58 PM    Final    DG Chest 1 View  Result Date: 05/31/2022 CLINICAL DATA:  Shortness of breath EXAM: CHEST  1 VIEW COMPARISON:  05/30/2022 FINDINGS: Endotracheal tube terminates 7 cm above the carina. Multifocal patchy left lung opacities, favoring pneumonia, possibly on the basis of aspiration. Right lung is essentially clear. No pleural effusion or pneumothorax. The heart is normal in size. Enteric tube courses into the stomach. Cholecystectomy clips. IMPRESSION: Endotracheal tube terminates 7 cm above the carina. Multifocal patchy left lung opacities, favoring pneumonia, possibly on the basis of aspiration. Electronically Signed   By: Julian Hy M.D.   On: 05/31/2022 03:54   DG CHEST PORT 1 VIEW  Result Date: 05/30/2022 CLINICAL DATA:  Endotracheal and orogastric tube. EXAM: PORTABLE CHEST 1 VIEW COMPARISON:  Chest CT 05/29/2022.  Chest x-ray 05/30/2009 3. FINDINGS: Endotracheal tube tip is 5 cm above the carina. Enteric tube tip is in the mid stomach. There are increasing central interstitial and airspace opacities throughout the left lung. Severe emphysematous changes are again seen in the right upper lobe. There is no pleural effusion or pneumothorax. No acute fractures are seen. Cardiomediastinal silhouette is within normal limits. IMPRESSION: 1. Endotracheal tube tip 5 cm above carina. 2. Enteric tube tip at the level of the mid stomach. 3. Increasing left lung interstitial and airspace opacities which may represent edema or infection. 4. Stable severe  emphysema. Electronically Signed   By: Ronney Asters M.D.   On: 05/30/2022 19:50   DG CHEST PORT 1 VIEW  Result Date: 05/30/2022 CLINICAL DATA:  Respiratory failure, pneumonia EXAM: PORTABLE CHEST 1 VIEW COMPARISON:  Previous studies including chest radiograph done on 05/28/2022 and CT done on 05/29/2022 FINDINGS: There is diffuse increase in patchy interstitial and alveolar densities in both lungs, more so on the left side. There is relative sparing of right upper lung field due to underlying bullous emphysema. There is blunting of both lateral CP angles. There is no pneumothorax. IMPRESSION: There is diffuse increase in interstitial and alveolar markings in both lungs, more so on the left side. Findings suggest asymmetric pulmonary edema or worsening multifocal bilateral pneumonia. Small bilateral pleural effusions. Electronically Signed   By: Elmer Picker M.D.   On: 05/30/2022 10:05   ECHOCARDIOGRAM COMPLETE  Result Date: 05/29/2022    ECHOCARDIOGRAM REPORT   Patient Name:   BRINLEY WIBBENMEYER Date of Exam: 05/29/2022 Medical Rec #:  [id number]         Height:       60.0 in Accession #:    [id number]        Weight:       88.2 lb Date of Birth:  03-06-1951         BSA:          1.318 m Patient Age:    76 years          BP:           143/66 mmHg Patient Gender: F                 HR:           113 bpm. Exam Location:  [facility] Procedure: 2D Echo, Cardiac Doppler and Color Doppler Indications:    Elevated Troponin  History:        Patient has prior history of Echocardiogram examinations, most                 recent 04/14/2018. CAD and Previous Myocardial Infarction, COPD                 and Stroke; Risk Factors:Hypertension and Dyslipidemia. Hx of                 CKD, COVID-10.  Sonographer:    Alvino Chapel RCS Referring Phys: [phone] DAVID TAT  Sonographer Comments: ** Patient on BiPAP and moving Alot during echo. IMPRESSIONS  1. Left ventricular ejection fraction, by estimation, is 35 to 40%. The left  ventricle has moderately decreased function. The left ventricle demonstrates regional wall motion abnormalities (see scoring diagram/findings for description). Left ventricular  diastolic parameters are indeterminate.  2. Right ventricular systolic function is mildly reduced. The right ventricular size is normal. There is moderately elevated pulmonary artery systolic pressure. The estimated right ventricular systolic pressure is 99991111 mmHg.  3. The mitral valve is degenerative. Trivial mitral valve regurgitation.  4. The aortic valve was not well visualized. Aortic valve regurgitation is not visualized. No aortic stenosis is present.  5. The inferior vena cava is dilated in size with <50% respiratory variability, suggesting right atrial pressure of 15 mmHg. Conclusion(s)/Recommendation(s): Normal systolic function at base with apical akinesis, differential includes LAD infarct vs stress induced cardiomyopathy. FINDINGS  Left Ventricle: Left ventricular ejection fraction, by estimation, is 35 to 40%. The left ventricle has moderately decreased function. The left ventricle demonstrates regional wall motion abnormalities. The left ventricular internal cavity size was normal in size. There is no left ventricular hypertrophy. Left ventricular diastolic parameters are indeterminate.  LV Wall Scoring: The mid and distal anterior septum, entire apex, and mid inferoseptal segment are akinetic. The anterior wall, antero-lateral wall, inferior wall, posterior wall, basal anteroseptal segment, and basal inferoseptal segment are normal. Right Ventricle: The right ventricular size is normal. Right vetricular wall thickness was not well visualized. Right ventricular systolic function is mildly reduced. There is moderately elevated pulmonary artery systolic pressure. The tricuspid regurgitant velocity is 3.00 m/s, and with an assumed right atrial pressure of 15 mmHg, the estimated right ventricular systolic pressure is [id number] mmHg.  Left Atrium: Left atrial size was normal in size. Right Atrium: Right atrial size was normal in size. Pericardium: There is no evidence of pericardial effusion. Mitral Valve: The mitral valve is degenerative in appearance. Trivial mitral valve regurgitation. Tricuspid Valve: The tricuspid valve is normal in structure. Tricuspid valve regurgitation is trivial. Aortic Valve: The aortic valve was not well visualized. Aortic valve regurgitation is not visualized. No aortic stenosis is present. Pulmonic Valve: The pulmonic valve was not well visualized. Pulmonic valve regurgitation is not visualized. Aorta: The aortic root is normal in size and structure. Venous: The inferior vena cava is  dilated in size with less than 50% respiratory variability, suggesting right atrial pressure of 15 mmHg. IAS/Shunts: The interatrial septum was not well visualized.  LEFT VENTRICLE PLAX 2D LVIDd:         3.60 cm LVIDs:         2.50 cm LV PW:         0.90 cm LV IVS:        0.90 cm LVOT diam:     1.70 cm LV SV:         37 LV SV Index:   28 LVOT Area:     2.27 cm  LV Volumes (MOD) LV vol d, MOD A2C: 51.2 ml LV vol d, MOD A4C: 57.0 ml LV vol s, MOD A2C: 30.1 ml LV vol s, MOD A4C: 32.9 ml LV SV MOD A2C:     21.1 ml LV SV MOD A4C:     57.0 ml LV SV MOD BP:      24.6 ml RIGHT VENTRICLE TAPSE (M-mode): 1.2 cm LEFT ATRIUM             Index        RIGHT ATRIUM           Index LA diam:        2.60 cm 1.97 cm/m   RA Area:     10.40 cm LA Vol (A2C):   32.4 ml 24.58 ml/m  RA Volume:   25.30 ml  19.20 ml/m LA Vol (A4C):   32.5 ml 24.66 ml/m LA Biplane Vol: 35.2 ml 26.71 ml/m  AORTIC VALVE LVOT Vmax:   88.30 cm/s LVOT Vmean:  55.300 cm/s LVOT VTI:    0.164 m  AORTA Ao Root diam: 2.50 cm MITRAL VALVE                TRICUSPID VALVE MV Area (PHT): 7.16 cm     TR Peak grad:   36.0 mmHg MV Decel Time: 106 msec     TR Vmax:        300.00 cm/s MV E velocity: 136.00 cm/s                             SHUNTS                             Systemic VTI:  0.16  m                             Systemic Diam: 1.70 cm Oswaldo Milian MD Electronically signed by Oswaldo Milian MD Signature Date/Time: 05/29/2022/2:52:39 PM    Final    CT Angio Chest Pulmonary Embolism (PE) W or WO Contrast  Result Date: 05/29/2022 CLINICAL DATA:  Shortness of breath x2 weeks EXAM: CT ANGIOGRAPHY CHEST WITH CONTRAST TECHNIQUE: Multidetector CT imaging of the chest was performed using the standard protocol during bolus administration of intravenous contrast. Multiplanar CT image reconstructions and MIPs were obtained to evaluate the vascular anatomy. RADIATION DOSE REDUCTION: This exam was performed according to the departmental dose-optimization program which includes automated exposure control, adjustment of the mA and/or kV according to patient size and/or use of iterative reconstruction technique. CONTRAST:  70mL OMNIPAQUE IOHEXOL 350 MG/ML SOLN COMPARISON:  Previous studies including the chest radiograph done on 05/28/2022 FINDINGS: Cardiovascular: There is homogeneous enhancement in thoracic aorta. There are no intraluminal filling defects  in central pulmonary artery branches. There are scattered coronary artery calcifications. Mediastinum/Nodes: No significant lymphadenopathy seen. There are small pockets of air in right subclavian vein, possibly introduced during venipuncture. Lungs/Pleura: Centrilobular and panlobular emphysema is seen. 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 There is small patchy infiltrate in the lateral aspect of posterior segment of right upper lobe. There is thickening of interlobular septi in the lower lung fields. There are patchy infiltrates in right middle lobe and both lower lobes. Small right pleural effusion is seen. There is minimal left pleural effusion. There is no pneumothorax. Upper Abdomen: There is 5.1 cm smooth marginated fluid density lesion in the upper pole of left kidney suggesting possible cyst. No follow-up is recommended.  There are a few low-density lesions in liver measuring up to 11 mm in size which are not fully characterized, most likely cysts. No follow-up imaging is recommended. Musculoskeletal: No acute findings are seen. Review of the MIP images confirms the above findings. IMPRESSION: There is no evidence of pulmonary artery embolism. There is no evidence of thoracic aortic dissection. Scattered coronary artery calcifications are seen. 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 There are small patchy infiltrates in posterior segment of right upper lobe, right middle lobe and both lower lobes suggesting multifocal atelectasis/pneumonia. Bilateral pleural effusions, more so on the right side. There are low-density lesions in left kidney and liver, possibly cysts. No follow-up imaging is recommended. Electronically Signed   By: Elmer Picker M.D.   On: 05/29/2022 13:04   DG Chest Portable 1 View  Result Date: 05/28/2022 CLINICAL DATA:  Shortness of breath for 2 weeks.  COVID positive. EXAM: PORTABLE CHEST 1 VIEW COMPARISON:  05/02/2022 FINDINGS: Lungs are hyperinflated. Significant bullous changes at the RIGHT lung apex, similar to prior. There are no focal consolidations or pleural effusions. No pulmonary edema. IMPRESSION: Hyperinflation of the lungs. No evidence for acute pulmonary abnormality. Electronically Signed   By: Nolon Nations M.D.   On: 05/28/2022 17:12    Barton Dubois, MD  [facility]  If 7PM-7AM, please contact night-coverage [email or web address] Password [email or web address] 06/09/2022, 3:35 PM   LOS: 12 days

## 2022-06-09 NOTE — Progress Notes (Addendum)
pt had bath and sitting in chair and tachy. hr remained 130s/140s after resting. metoprolol 5mg  iv given and now pt in 90s. also pt has labored breathing, sats from two machines showed 60% on ra and RT also at bedside, with perfect pleth.pt placed on 6L [place] by Rt at 0935 and pt quickly up to 100%. Pt down to 5L at  0940. pt on 4L at 0950 and at 100%. Right lower and upper lung sounds decreased and course. Dr Dyann Kief aware. Dr Harl Bowie in at 1012 to assess as well. Pt told Dr Harl Bowie she was c/o burning in mid chest with no radiation x 2 days and worse with eating.

## 2022-06-10 ENCOUNTER — Inpatient Hospital Stay (HOSPITAL_COMMUNITY)
Admit: 2022-06-10 | Discharge: 2022-06-10 | Disposition: A | Discharge status: Home / Self Care | Payer: Medicare Other | Attending: Family Medicine | Admitting: Family Medicine

## 2022-06-10 ENCOUNTER — Inpatient Hospital Stay (HOSPITAL_COMMUNITY): Payer: Medicare Other

## 2022-06-10 ENCOUNTER — Encounter (HOSPITAL_COMMUNITY): Payer: Self-pay | Admitting: Internal Medicine

## 2022-06-10 DIAGNOSIS — J9601 Acute respiratory failure with hypoxia: Secondary | ICD-10-CM | POA: Diagnosis not present

## 2022-06-10 DIAGNOSIS — R4182 Altered mental status, unspecified: Secondary | ICD-10-CM

## 2022-06-10 DIAGNOSIS — Z515 Encounter for palliative care: Secondary | ICD-10-CM

## 2022-06-10 DIAGNOSIS — J9602 Acute respiratory failure with hypercapnia: Secondary | ICD-10-CM | POA: Diagnosis not present

## 2022-06-10 DIAGNOSIS — Z7189 Other specified counseling: Secondary | ICD-10-CM

## 2022-06-10 LAB — CBC
HCT: 37.7 % (ref 36.0–46.0)
Hemoglobin: 11.4 g/dL — ABNORMAL LOW (ref 12.0–15.0)
MCH: 35.5 pg — ABNORMAL HIGH (ref 26.0–34.0)
MCHC: 30.2 g/dL (ref 30.0–36.0)
MCV: 117.4 fL — ABNORMAL HIGH (ref 80.0–100.0)
Platelets: 127 10*3/uL — ABNORMAL LOW (ref 150–400)
RBC: 3.21 MIL/uL — ABNORMAL LOW (ref 3.87–5.11)
RDW: 16 % — ABNORMAL HIGH (ref 11.5–15.5)
WBC: 20.5 10*3/uL — ABNORMAL HIGH (ref 4.0–10.5)
nRBC: 0 % (ref 0.0–0.2)

## 2022-06-10 LAB — BLOOD GAS, ARTERIAL
Acid-Base Excess: 7.5 mmol/L — ABNORMAL HIGH (ref 0.0–2.0)
Bicarbonate: 32 mmol/L — ABNORMAL HIGH (ref 20.0–28.0)
Drawn by: 21310
O2 Saturation: 96.3 %
Patient temperature: 37.2
pCO2 arterial: 44 mmHg (ref 32–48)
pH, Arterial: 7.47 — ABNORMAL HIGH (ref 7.35–7.45)
pO2, Arterial: 76 mmHg — ABNORMAL LOW (ref 83–108)

## 2022-06-10 LAB — TROPONIN I (HIGH SENSITIVITY): Troponin I (High Sensitivity): 107 ng/L (ref <18)

## 2022-06-10 LAB — GLUCOSE, CAPILLARY
Glucose-Capillary: 103 mg/dL — ABNORMAL HIGH (ref 70–99)
Glucose-Capillary: 119 mg/dL — ABNORMAL HIGH (ref 70–99)
Glucose-Capillary: 119 mg/dL — ABNORMAL HIGH (ref 70–99)
Glucose-Capillary: 121 mg/dL — ABNORMAL HIGH (ref 70–99)
Glucose-Capillary: 121 mg/dL — ABNORMAL HIGH (ref 70–99)
Glucose-Capillary: 133 mg/dL — ABNORMAL HIGH (ref 70–99)
Glucose-Capillary: 148 mg/dL — ABNORMAL HIGH (ref 70–99)

## 2022-06-10 LAB — COMPREHENSIVE METABOLIC PANEL
ALT: 99 U/L — ABNORMAL HIGH (ref 0–44)
AST: 39 U/L (ref 15–41)
Albumin: 2.9 g/dL — ABNORMAL LOW (ref 3.5–5.0)
Alkaline Phosphatase: 60 U/L (ref 38–126)
Anion gap: 10 (ref 5–15)
BUN: 17 mg/dL (ref 8–23)
CO2: 30 mmol/L (ref 22–32)
Calcium: 9.1 mg/dL (ref 8.9–10.3)
Chloride: 108 mmol/L (ref 98–111)
Creatinine, Ser: 1.12 mg/dL — ABNORMAL HIGH (ref 0.44–1.00)
GFR, Estimated: 52 mL/min — ABNORMAL LOW (ref >60)
Glucose, Bld: 121 mg/dL — ABNORMAL HIGH (ref 70–99)
Potassium: 3.4 mmol/L — ABNORMAL LOW (ref 3.5–5.1)
Sodium: 148 mmol/L — ABNORMAL HIGH (ref 135–145)
Total Bilirubin: 1.2 mg/dL (ref 0.3–1.2)
Total Protein: 5.4 g/dL — ABNORMAL LOW (ref 6.5–8.1)

## 2022-06-10 LAB — MAGNESIUM: Magnesium: 2 mg/dL (ref 1.7–2.4)

## 2022-06-10 LAB — AMMONIA: Ammonia: 27 umol/L (ref 9–35)

## 2022-06-10 MED ORDER — LORAZEPAM 2 MG/ML IJ SOLN
1.0000 mg | Freq: Three times a day (TID) | INTRAMUSCULAR | Status: DC | PRN
  Administered 2022-06-10 – 2022-06-11 (×2): 1 mg via INTRAVENOUS
  Filled 2022-06-10 (×2): qty 1

## 2022-06-10 MED ORDER — LORAZEPAM 2 MG/ML IJ SOLN
INTRAMUSCULAR | Status: AC
  Filled 2022-06-10: qty 1

## 2022-06-10 MED ORDER — ASPIRIN 300 MG RE SUPP
300.0000 mg | Freq: Every day | RECTAL | Status: DC
  Administered 2022-06-10 – 2022-06-13 (×3): 300 mg via RECTAL
  Filled 2022-06-10 (×5): qty 1

## 2022-06-10 MED ORDER — LORAZEPAM 2 MG/ML IJ SOLN
1.0000 mg | Freq: Once | INTRAMUSCULAR | Status: AC
  Administered 2022-06-10: 1 mg via INTRAVENOUS

## 2022-06-10 MED ORDER — LORAZEPAM 2 MG/ML IJ SOLN: 1.0000 mg | INTRAMUSCULAR | Status: DC | PRN

## 2022-06-10 MED ORDER — POTASSIUM CL IN DEXTROSE 5% 20 MEQ/L IV SOLN: 20.0000 meq | INTRAVENOUS | Status: DC

## 2022-06-10 MED ORDER — POTASSIUM CL IN DEXTROSE 5% 20 MEQ/L IV SOLN
20.0000 meq | INTRAVENOUS | Status: DC
  Administered 2022-06-10 – 2022-06-11 (×3): 20 meq via INTRAVENOUS

## 2022-06-10 MED ORDER — LORAZEPAM 2 MG/ML IJ SOLN: 1.0000 mg | Freq: Once | INTRAMUSCULAR | Status: DC

## 2022-06-10 NOTE — Consult Note (Signed)
I connected with  [name] on 06/10/22 by a video enabled telemedicine application and verified that I am speaking with the correct person using two identifiers.   I discussed the limitations of evaluation and management by telemedicine. The patient expressed understanding and agreed to proceed.  Location of patient: [facility] Requesting physician: [facility]   Neurology Consultation Reason for Consult: Seizure Referring Physician: Dr. Roxan Hockey  CC: Seizure  History is obtained from: Chart review  HPI: [name] is a 71 y.o. female with medical history of hypertension, chronic stroke, coronary artery disease, blind in right eye, depression and psychosis who presented on 05/28/2022 with difficulty breathing.  Of note patient was admitted from 03/03/2019 2320/90/23 for COVID-19 infection.  Chest x-ray showed hyperinflation of lungs.  CT angio chest did not show any evidence of PE there was suggestive of severe COPD as well as emphysema.  Patchy infiltrates were seen in posterior segment of right upper lobe, right middle lobe and both lower lobes concerning for multifocal A. tach to assess/pneumonia.  Bilateral pleural effusions were also seen, more on the right side.  Patient was placed on BiPAP with improvement in symptoms.  However on 05/29/2022 she had worsening chest discomfort.  Work-up revealed troponins elevated at 375--> 478 as well as elevated D-dimer of 3.97.  Therefore patient was started on IV heparin and eventually transitioned to Plavix.  On 05/30/2022, respiratory distress worsened on BiPAP and therefore patient was eventually intubated.   Per RN note, at around 4 AM today patient was initially noted to be kicking feet in the air.  She then started shaking uncontrollably with foaming mouth and heart rate in 150s.  The episode lasted for about 30 seconds. I attempted to call both son;s for more history but no one picked up.   ROS: Unable to obtain  due to altered mental status.   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 Her smoking use included cigarettes. She has a 10.00 pack-year smoking history. She has never used smokeless tobacco. She reports current drug use. Drug: Marijuana. She reports that she does not drink alcohol.   Medications Prior to Admission  Medication Sig Dispense Refill Last Dose   acetaminophen (TYLENOL) 325 MG tablet Take 2 tablets (650 mg total) by mouth every 6 (six) hours as needed for mild pain or headache (or Fever >/= 101).   unk   ascorbic acid (VITAMIN C) 500 MG tablet Take 1 tablet (500 mg total) by mouth daily. 30 tablet 1 05/28/2022   aspirin EC 81 MG tablet Take 81 mg by mouth daily.     05/28/2022   atorvastatin (LIPITOR) 20 MG tablet Take 1 tablet (20 mg total) by mouth daily. 30 tablet 1 Past Week   citalopram (CELEXA) 20 MG tablet  Take 20 mg by mouth daily.   05/28/2022   cloNIDine (CATAPRES) 0.1 MG tablet Take 3 tablets (0.3 mg total) by mouth 2 (two) times daily. 60 tablet 2 05/28/2022   dextromethorphan-guaiFENesin (MUCINEX DM) 30-600 MG per 12 hr tablet Take 1 tablet by mouth 2 (two) times daily.   05/28/2022   docusate sodium (COLACE) 100 MG capsule Take 100 mg by mouth daily.     05/28/2022   [EXPIRED] fluconazole (DIFLUCAN) 150 MG tablet Take 1 tablet (150 mg total) by mouth daily for 22 days. Take 2 pills the first day, then continue one pill daily 22 tablet 0 [id number]    folic acid (FOLVITE) 1 MG tablet Take 1 tablet (1 mg total) by mouth daily.   05/28/2022   Ipratropium-Albuterol (COMBIVENT) 20-100 MCG/ACT AERS respimat Inhale 1 puff into the lungs every 6 (six) hours as needed for wheezing or shortness of breath. 4 g 2 05/28/2022   loratadine (CLARITIN) 10 MG tablet Take 10 mg by mouth daily.   05/28/2022   metoprolol tartrate (LOPRESSOR) 50 MG tablet Take 50 mg by mouth 2 (two) times daily.   05/28/2022 at am   mirtazapine (REMERON) 7.5 MG tablet Take 7.5 mg by mouth at bedtime.   Past Week   pantoprazole (PROTONIX) 40 MG tablet Take 1 tablet (40 mg total) by mouth 2 (two) times daily. 60 tablet 1 05/28/2022   vitamin B-12 (CYANOCOBALAMIN) 500 MCG tablet Take 1 tablet (500 mcg total) by mouth daily.   05/28/2022   zinc sulfate 220 (50 Zn) MG capsule Take 1 capsule (220 mg total) by mouth daily. 30 capsule 1 05/28/2022   megestrol (MEGACE) 400 MG/10ML suspension Take 10 mLs (400 mg total) by mouth 2 (two) times daily. (Patient not taking: Reported on 05/29/2022) 240 mL 1 Not Taking   predniSONE (DELTASONE) 10 MG tablet Take 4 tablets by mouth daily x1 day; then 3 tablet by mouth daily x1 day; then 2 tablet by mouth daily x2 days; then 1 tablet by mouth daily x3 days and stop prednisone. (Patient not taking: Reported on 05/29/2022) 12 tablet 0 Not Taking      Exam: Current vital signs: BP (!) 142/85 (BP Location: Right Arm)   Pulse 100   Temp (!) 97.5 F (36.4 C) (Axillary)   Resp 17   Ht 5' (1.524 m)   Wt 44.8 kg   SpO2 99%   BMI 19.29 kg/m  Vital signs in last 24 hours: Temp:  [97.5 F (36.4 C)-99.9 F (37.7 C)] 97.5 F (36.4 C) (09/20 1223) Pulse Rate:  [75-155] 100 (09/20 1223) Resp:  [17-32] 17 (09/20 1223) BP: (142-179)/(54-125) 142/85 (09/20 1223) SpO2:  [95 %-100 %] 99 % (09/20 1223) Weight:  [44.8 kg] 44.8 kg (09/20 1042)   Physical Exam  Constitutional: laying in bed, NAD Neuro: barely opens eyes to tactile stimuli, doesn't follow commands, doesn't  track examiner, PERLA, no gaze deviation, spontaneously moves extremities in bed   I have reviewed labs in epic and the results pertinent to this consultation are: CBC:  Recent Labs  Lab 06/09/22 0406 06/10/22 0414  WBC 14.5* 20.5*  HGB 11.3* 11.4*  HCT 35.4* 37.7  MCV 109.9* 117.4*  PLT 143* 127*    Basic Metabolic Panel:  Lab Results  Component Value Date   NA 148 (H) 06/10/2022   K 3.4 (L) 06/10/2022   CO2 30 06/10/2022   GLUCOSE 121 (H) 06/10/2022   BUN 17 06/10/2022   CREATININE 1.12 (H) 06/10/2022  CALCIUM 9.1 06/10/2022   GFRNONAA 52 (L) 06/10/2022   GFRAA >90 07/16/2014   Lipid Panel:  Lab Results  Component Value Date   LDLCALC 72 05/30/2022   HgbA1c:  Lab Results  Component Value Date   HGBA1C 5.9 (H) 05/07/2022   Urine Drug Screen:     Component Value Date/Time   LABOPIA POSITIVE (A) 05/29/2022 1807   COCAINSCRNUR NONE DETECTED 05/29/2022 1807   LABBENZ NONE DETECTED 05/29/2022 1807   AMPHETMU NONE DETECTED 05/29/2022 1807   THCU POSITIVE (A) 05/29/2022 1807   LABBARB NONE DETECTED 05/29/2022 1807    Alcohol Level No results found for: "ETH"   I have reviewed the images obtained: No brain imaging available  ASSESSMENT/PLAN: 71yo F with ams and seizure like activity this morning.  Seizure like activity Acute encephalopathy - Semiology of the episodes isnt typical for epileptic seizure. No documented h/o of prior seizures - encephalopathy likely due to ativan use, stroke, subclinical seizures, toxic-metabolic causes. Has not been consistently hypertensive so less likely PRES  Recommendations: - will check EEG to assess for epileptogenicity, subclinical seizures - will get CT head wo contrast to assess for acute abnormality - normal B12, folate and TSH earlier this month, will check ammonia to look for other reversible causes - Hold off on Aeds unless eeg shows epileptogenicity. If needed will start Depakote or oxcarb as they have mood  stabilizing effects  - avoid sedating meds  Addendum - CT head showed multiple age-indeterminate infratentorial and bilateral supratentorial cortical hypodensities, worrisome for age indeterminate infarcts.  - will obtain MRI brain and MRA head and neck for further w/u   Thank you for allowing [place] to participate in the care of this patient. If you have any further questions, please contact  me or neurohospitalist.   Zeb [facility]

## 2022-06-10 NOTE — Progress Notes (Signed)
RN reports tonic clonic activity. Lasted 30 seconds. Labs ordered including blood gas. Eeg ordered for AM. No anticonvulsants ordered. Ativan was ordered later as patient became anxious.

## 2022-06-10 NOTE — Progress Notes (Signed)
Lab called with critical Troponin level of 107. MD aware, no new orders at this time.

## 2022-06-10 NOTE — Progress Notes (Signed)
EEG complete - results pending 

## 2022-06-10 NOTE — Progress Notes (Addendum)
Rounding Note    Patient Name: [name] Date of Encounter: 06/10/2022  [place] Cardiologist: Donato Heinz, MD   Subjective   Notes from overnight reviewed. Was tachycardiac with HR into the 150's and also noted to have tonic-clonic activity and patient was foaming at the mouth by review of notes. EEG was ordered and is pending at this time. Hs Troponin values were rechecked and flat at 107.  Patient awake this morning but moaning in bed. Not responding to questions. Patient's son at the bedside.   Inpatient Medications    Scheduled Meds:  arformoterol  15 mcg Nebulization BID   bisoprolol  10 mg Oral Daily   budesonide (PULMICORT) nebulizer solution  0.5 mg Nebulization BID   Chlorhexidine Gluconate Cloth  6 each Topical Q0600   citalopram  20 mg Oral Daily   clopidogrel  75 mg Oral Daily   feeding supplement  237 mL Oral TID BM   heparin  5,000 Units Subcutaneous Q8H   insulin aspart  0-9 Units Subcutaneous Q4H   losartan  12.5 mg Oral Daily   mirtazapine  7.5 mg Oral QHS   mupirocin ointment   Nasal BID   pantoprazole (PROTONIX) IV  40 mg Intravenous Q12H   phosphorus  500 mg Oral BID   polyethylene glycol  17 g Oral Daily   predniSONE  40 mg Oral Q breakfast   revefenacin  175 mcg Nebulization Daily   rosuvastatin  20 mg Oral QPM   saccharomyces boulardii  250 mg Oral BID   sodium chloride flush  10-40 mL Intracatheter Q12H   spironolactone  12.5 mg Oral Daily   Continuous Infusions:  sodium chloride Stopped (06/06/22 2139)   dextrose 5 % with KCl 20 mEq / L 20 mEq (06/10/22 0942)   PRN Meds: sodium chloride, acetaminophen **OR** acetaminophen, albuterol, diphenoxylate-atropine, fentaNYL, fentaNYL (SUBLIMAZE) injection, haloperidol lactate, hydrALAZINE, LORazepam, metoprolol tartrate, nitroGLYCERIN, ondansetron **OR** ondansetron (ZOFRAN) IV, mouth rinse, polyethylene glycol, polyvinyl alcohol, sodium chloride flush   Vital Signs     Vitals:   06/10/22 0026 06/10/22 0110 06/10/22 0635 06/10/22 0800  BP: (!) 146/59 (!) 162/70 (!) 173/74   Pulse: 75 78 100 (!) 111  Resp: 20  (!) 22 18  Temp: 98.2 F (36.8 C) 99.9 F (37.7 C) 98.5 F (36.9 C)   TempSrc: Oral Axillary Oral   SpO2: 98% 100% 100% 99%  Weight:      Height:        Intake/Output Summary (Last 24 hours) at 06/10/2022 0959 Last data filed at 06/10/2022 0500 Gross per 24 hour  Intake 170 ml  Output 350 ml  Net -180 ml      06/09/2022    3:33 AM 06/08/2022    6:12 AM 06/05/2022    5:00 AM  Last 3 Weights  Weight (lbs) 98 lb 5.2 oz 101 lb 6.6 oz 98 lb 8.7 oz  Weight (kg) 44.6 kg 46 kg 44.7 kg      Telemetry    Sinus tachycardia, HR in low-100's and peaked into the 150's overnight.  - Personally Reviewed  ECG    Narrow-complex tachycardia, HR 158 with TWI along precordial leads.  - Personally Reviewed  Physical Exam   GEN: Frail female. Resting in bed and moaning intermittently.  Neck: No JVD Cardiac: RRR, no murmurs, rubs, or gallops.  Respiratory: Clear to auscultation bilaterally. GI: Soft, nontender, non-distended  MS: No pitting edema; No deformity. Psych: Unable to assess. Not  responding to questions.     Labs    High Sensitivity Troponin:   Recent Labs  Lab 05/29/22 1012 06/08/22 0921 06/08/22 1154 06/09/22 2156 06/09/22 2329  TROPONINIHS 368* 133* 113* 107* 107*     Chemistry Recent Labs  Lab 06/07/22 0603 06/08/22 0921 06/09/22 0406 06/10/22 0414  NA 146* 145 148* 148*  K 3.3* 3.1* 3.2* 3.4*  CL 112* 115* 112* 108  CO2 26 21* 30 30  GLUCOSE 97 111* 98 121*  BUN 22 16 17 17   CREATININE 0.82 0.71 0.79 1.12*  CALCIUM 8.8* 8.3* 9.0 9.1  MG 2.0  --  1.7 2.0  PROT  --   --   --  5.4*  ALBUMIN  --   --   --  2.9*  AST  --   --   --  39  ALT  --   --   --  99*  ALKPHOS  --   --   --  60  BILITOT  --   --   --  1.2  GFRNONAA >60 >60 >60 52*  ANIONGAP 8 9 6 10     Lipids No results for input(s): "CHOL",  "TRIG", "HDL", "LABVLDL", "LDLCALC", "CHOLHDL" in the last 168 hours.  Hematology Recent Labs  Lab 06/08/22 0618 06/09/22 0406 06/10/22 0414  WBC 12.9* 14.5* 20.5*  RBC 3.26* 3.22* 3.21*  HGB 11.5* 11.3* 11.4*  HCT 36.7 35.4* 37.7  MCV 112.6* 109.9* 117.4*  MCH 35.3* 35.1* 35.5*  MCHC 31.3 31.9 30.2  RDW 16.4* 16.2* 16.0*  PLT 152 143* 127*   Thyroid No results for input(s): "TSH", "FREET4" in the last 168 hours.  BNP Recent Labs  Lab 06/09/22 0406  BNP 1,803.0*    DDimer No results for input(s): "DDIMER" in the last 168 hours.   Radiology    No results found.  Cardiac Studies   Limited Echo: 05/31/2022 IMPRESSIONS     1. Left ventricular ejection fraction, by estimation, is 35 to 40%. The  left ventricle has moderately decreased function. The left ventricle  demonstrates regional wall motion abnormalities (see scoring  diagram/findings for description). The mid septal  and all apical LV segments are akinetic (consistent with prior TTE).   2. Right ventricular systolic function is mildly reduced. The right  ventricular size is normal.   3. The mitral valve is degenerative. Moderate mitral annular  calcification.   Comparison(s): Compared to prior TTE on 05/29/22, there is no significant  change.   Patient Profile     71 y.o. female w/ PMH of reported CAD (with MI/PTCA and possible stent to RCA in 1997, NSTEMI 2004 with occluded RCA treated medically), HTN, HLD, anxiety, depression, remote CVA ~2003, former tobacco abuse, and severe COPD who presented with acute hypoxic respiratory failure in the setting of acute COPD exacerbation, pneumonia and recent COVID 19 infection with course complicated by NSTEMI and newly diagnosed HFrEF with LVEF 35-40% for which Cardiology was consulted.   Assessment & Plan    1. HFrEF - Echo imaging this admission has shown a reduced EF of 35-40% and possibly stress-induced but cannot rule-out an ischemic etiology.  - Medical  therapy was initially limited due to hypotension but she has gradually been started on Losartan 12.5mg  daily (allergic to ACE-I which limits the use of Entresto as well), Bisoprolol 10mg  daily and Aldactone 12.5mg  daily.  - She has been receiving IV Lasix 40mg  daily and would hold for now given NPO status and worsening AMS. Prior  notes mentioned adding an SGLT2 inhibitor but not an ideal candidate at this time given her NPO status and malnutrition (BMI 19).   2. CAD - She has known CAD with PTCA to RCA in 1997 and NSTEMI in 2004 and found to have a CTO of the RCA with medical management recommended. Hs Troponin values peaked at 478 this admission and she was treated with IV Heparin for 48 hours.  - Initial plan was for a catheterization once her status improved but now not felt to be a candidate given her episodes of AMS and possible seizure. Therefore, would focus on medical management at this time with plans for a follow-up echocardiogram as an outpatient and can decide on additional ischemic evaluation at that time pending improvement in her clinical status.  - Continue Bisoprolol, Crestor and Plavix (allergic to ASA).   3. Acute Hypoxic Respiratory Failure - Multifactorial in the setting of COVID, COPD exacerbation and PNA. Was extubated on 06/04/2022. Management per the admitting team.   4. Sinus Tachycardia - Noted on telemetry overnight but improved this AM. Mg normal at 2.0 and K+ low at 3.4. Can give IV supplementation if unable to take PO's. Continue Bisoprolol 10mg  daily.   For questions or updates, please contact [facility] Please consult [email or web address] for contact info under        Signed, , PA-C  06/10/2022, 9:59 AM    Personally seen and examined. Agree with above.  Currently delirious.  Longstanding hospitalization. Previously receiving IV Lasix 40 mg a day.  Given her worsening mental status, continued malnutrition and multifactorial respiratory  failure we should probably hold off on Lasix at this point. No other changes from cardiology perspective. She is not a candidate for cardiac catheterization.  We will go ahead and sign off.  Please let 06/12/2022 know if we can be of further assistance.  [place], MD

## 2022-06-10 NOTE — Progress Notes (Signed)
Central telemetry called this nurse informing pt sustaining heart rate in the 160's. Upon entering the room, pt had arm over chest moaning out in pain. Pt not verbally responsive to this nurse. Vitals obtained with heart rate 155 and respirations 32, turing pt to Red MEWS. Charge nurse and respiratory called to room. STAT EKG performed. PRN Metoprolol given as well as Fentanyl. MD aware, ordered Nitro and labs. Pt heart rate back down to 86 before this writer left the room. Will continue to closely monitor this pt.

## 2022-06-10 NOTE — Progress Notes (Signed)
   06/09/22 2142  Assess: MEWS Score  Temp 98.1 F (36.7 C)  BP (!) 179/125  MAP (mmHg) 143  Pulse Rate (!) 155  Resp (!) 32  SpO2 95 %  O2 Device Nasal Cannula  O2 Flow Rate (L/min) 2 L/min  Assess: MEWS Score  MEWS Temp 0  MEWS Systolic 0  MEWS Pulse 3  MEWS RR 2  MEWS LOC 0  MEWS Score 5  MEWS Score Color Red  Assess: if the MEWS score is Yellow or Red  Were vital signs taken at a resting state? Yes  Focused Assessment Change from prior assessment (see assessment flowsheet)  Does the patient meet 2 or more of the SIRS criteria? No  MEWS guidelines implemented *See Row Information* Yes  Treat  MEWS Interventions Administered prn meds/treatments;Consulted Respiratory Therapy  Pain Scale Faces  Faces Pain Scale 8  Pain Type Acute pain  Pain Location Chest  Pain Intervention(s) Medication (See eMAR)  Take Vital Signs  Increase Vital Sign Frequency  Red: Q 1hr X 4 then Q 4hr X 4, if remains red, continue Q 4hrs  Escalate  MEWS: Escalate Red: discuss with charge nurse/RN and provider, consider discussing with RRT  Notify: Charge Nurse/RN  Name of Charge Nurse/RN Notified LeTina, RN  Date Charge Nurse/RN Notified 06/09/22  Time Charge Nurse/RN Notified 2145  Notify: Provider  Provider Name/Title Dr. Earnest Conroy  Date Provider Notified 06/09/22  Time Provider Notified 2145  Method of Notification Page (secure chat)  Notification Reason Change in status  Provider response See new orders  Date of Provider Response 06/09/22  Time of Provider Response 2150  Document  Patient Outcome Stabilized after interventions  Progress note created (see row info) Yes  Assess: SIRS CRITERIA  SIRS Temperature  0  SIRS Pulse 1  SIRS Respirations  1  SIRS WBC 1  SIRS Score Sum  3

## 2022-06-10 NOTE — Progress Notes (Signed)
[facility] radiology called going over the results of the CT. MD Courage notified.

## 2022-06-10 NOTE — Progress Notes (Signed)
PROGRESS NOTE  [name] BPZ:[id number] DOB: [birth year] DOA: 05/28/2022 PCP: Carrolyn Meiers, MD  Brief History:  71 year old female with a history of coronary disease, hypertension, hyperlipidemia, functional constipation, B12 and folate deficiency, anxiety, and prior stroke presenting with shortness of breath and respiratory failure.  The patient was recently mid to the hospital from 05/02/2022 to 05/09/2022 when she had acute respiratory failure secondary to COVID-19.  During that hospitalization, the patient was also noted to have colitis for which she was treated with levofloxacin and metronidazole.  She completed a course of antibiotics prior to discharge.  She was discharged home with a prednisone taper. She states that she has had some shortness of breath since discharge from the hospital, but this has significantly worsened in the 24 hours prior to admission.  Upon arrival to the emergency department, the patient was noted to have respiratory distress patient was placed on BiPAP. Repeat ABG on BiPAP did not show significant improvement.  There was plans for intubation, but the patient was given Ativan with improvement clinically.  She remained on BiPAP overnight.  Notably, the patient states that she started having some burning chest discomfort on the morning of 05/28/2022.  She states that it somewhat improved while she was on BiPAP, but continues to have the chest discomfort.  She was taken off of BiPAP in the morning of 05/29/2022 and stated that she had some worsening of her burning chest discomfort.  She developed some increased shortness of breath resulting in placement back on BiPAP on the morning of 05/29/22.  Troponins were noted to be 375>> 478.  D-dimer was also noted to be elevated 3.97.  The patient was started on IV heparin. The patient was given Solu-Medrol 125 mg IV, morphine 1 mg IV and placed back on BiPAP.  Repeat EKG showed sinus tachycardia with T wave  inversion V2-V5.  Troponins continue to be cycled. cardiology was consulted to assist with management.   On the evening of 05/30/2022, the patient developed respiratory distress and hypoxia on BiPAP.  She remained agitated despite pharmacologic and nonpharmacologic interventions.  After discussion with the patient's family, decision was made to intubate the patient.  Repeat limited echocardiogram was ordered for 05/31/22.     Assessment and Plan: 1) acute metabolic encephalopathy----need to rule out acute stroke -Seizures less likely -Neurology consult appreciated -CT head without acute bleed cannot rule out acute strokes -Discussed with pharmacist okay to give rectal aspirin -MRI brain, MRA head and neck pending -EEG pending -Consider echo if MRI brain confirms acute stroke -N.p.o. for now  2)Social/Ethics--I tried to call her 2 sons Elta Guadeloupe  [phone] and [name] [phone], no answer -Palliative care and neurology team also tried to reach both sons left messages -Official palliative care consult pending  3)Acute respiratory failure with hypoxia and hypercarbia ([place]) - Presented with respiratory distress with oxygen saturation 77% on room air.  Appears to be secondary to COPD exacerbation and pneumonia -Patient failed the use of BiPAP and in the requiring to be intubated and mechanically ventilated.  (05/30/2022) -CTA chest--neg PE, severe emphysema; multifocal patchy infiltrates RUL, RLL, LLL -Patient has been extubated (06/04/2022);  -Currently on 2 L nasal cannula supplementation and reporting no significant shortness of breath -Continue current bronchodilator management and follow clinical response. -Continue t the use of incentive spirometer and flutter valve. -Continue steroids tapering process; has completed antibiotic therapy on 06/07/2022. -Appreciate pulmonology assistance. -Per cardiology recommendation Lasix IV  40 mg x 1 dose will be repeated today; patient demonstrating elevated  BNP.  Still using 2 L nasal cannula supplementation.  No frank crackles on exam. -Continue to follow daily weights and strict I's and O's. 06/10/22 -Patient with significant altered mentation unable to tolerate oral intake at this time risk for dehydration avoid aggressive diuresis at this time  4)COPD with acute exacerbation ([place]) -Although the patient denies a history of COPD and has not had formal PFTs--> she endorses smoking over 30 years, 1/2 pack/day. 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 -EKG--sinus rhythm, T wave depression V2-V5 -9/8 echo--EF 35-40%, apical HK, trivial MR/TR -9/10--repeat limited Echo-EF 35-40%, unchanged -9/11 started on Plavix after finalization of heparin drip. -Allergy planning the possibility for cardiac catheterization once medically stable and able to lay down flat. -Continue the use of bisoprolol and losartan. -Continue to follow cardiology service recommendations.  Based on patient's mood fluctuation and intermittent episode of combativeness/agitation they are considering the possibility of just medical management.  Lobar pneumonia ([place]) -CTA chest as discussed above -MRSA+ -Patient has completed treatment with antibiotics. -PCT 0.64 -Tracheal aspiration without any growth. -Continue supportive care.  Hypernatremia - Continue to maintain adequate amount of free water intake. -Follow electrolytes.  Impaired glucose tolerance -05/07/22 A1C--5.4 -Continue sliding scale insulin -Elevated CBGs in the setting of steroids usage and tube feedings.  Hypophosphatemia - Continue to follow electrolytes and replete as needed.  Hyperkalemia -Given lokelma and now receiving intermittent dosages of diuretics. -Electrolytes stable  currently -Continue telemetry monitoring and follow electrolytes trend. -Potassium down to 3.2; will replete and follow trend.  Cardiomyopathy ([place]) -9/8 echo--EF 35-40%, apical HK, trivial MR/TR -Discussed with cardiology>>suspect Takatsubo's  -finish 48 hours IV heparin 9/11>> patient has been started on Plavix.   Repeat limited Echo EF 35-40%, unchanged  Malnutrition of moderate degree -Advance diet -Count calories intake -Follow response.  Anemia -No signs of active blood loss -Hgb stable since admission -baseslin Hgb 10-11 -B12--674 -Folate 35.6 -Iron sat 37, ferritin 7392 -Continue to follow hemoglobin trend.  Chronic kidney disease, stage 3a ([place]) -Baseline creatinine 0.8-1.1 -Continue to follow renal function trend/stability.   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 -Continue risk factor modification. -No new focal deficits.  Essential hypertension -Continue bisoprolol (dose adjusted to 10 mg daily), initiation of losartan and one-time dose of Lasix today. -Follow-up vital signs and heart healthy/low-sodium diet. -Appreciate assistance and recommendation by cardiology service.  Diarrhea: -Most likely functional in the setting of tube feedings and antibiotics. -Continue supportive care -Continue the use of Florastor and continue to maintain adequate hydration. -No significant events reported.  Family Communication: No family at bedside for updates.   Consultants:  pulm, cardiology   Code Status:  FULL    DVT Prophylaxis:  [place] Heparin      Procedures: As Listed in Progress Note Above   Antibiotics: Vanc 9/8>>9/17 Aztreonam 9/8>>9/17 Azithro 9/8>>9/15     Subjective: 06/10/22 -Patient with significant altered mentation unable to tolerate oral intake at this time risk for dehydration avoid aggressive diuresis at this time -Concerns for possible acute stroke  versus seizures -Patient is largely nonverbal... Largely unresponsive at this time -No fevers, no emesis  Objective: Vitals:   06/10/22 0635 06/10/22 0800 06/10/22 1042 06/10/22 1223  BP: (!) 173/74  (!) 149/96 (!) 142/85  Pulse: 100 (!) 111 (!) 107 100  Resp: (!) 22 18 17 17   Temp: 98.5 F (36.9 C)   (!) 97.5 F (36.4 C)  TempSrc: Oral   Axillary  SpO2: 100% 99% 99% 99%  Weight:   44.8 kg   Height:        Intake/Output Summary (Last 24 hours) at 06/10/2022 1700 Last data filed at 06/10/2022 1100 Gross per 24 hour  Intake 120 ml  Output 350 ml  Net -230 ml   Weight change:   Exam:  Physical Exam  Gen:-Energy, sleepy, responds to tactile stimuli , occasionally responds to verbal stimuli by squeezing hand  HEENT:- [place].AT, No sclera icterus Neck-Supple Neck,No JVD,.  Lungs-fair air movement, no wheezing  CV- S1, S2 normal, RRR Abd-  +ve B.Sounds, Abd Soft, No tenderness,    Extremity/Skin:- No  edema,   good pedal pulses  Neuro-Psych-lethargic, largely unresponsive at this time occasionally mumbles, will squeeze hands at times -Limited exam as patient is not able to follow commands consistently, patient moves extremities spontaneously  Data Reviewed: I have personally reviewed following labs and imaging studies  Basic Metabolic Panel: Recent Labs  Lab 06/07/22 0603 06/08/22 0921 06/09/22 0406 06/10/22 0414  NA 146* 145 148* 148*  K 3.3* 3.1* 3.2* 3.4*  CL 112* 115* 112* 108  CO2 26 21* 30 30  GLUCOSE 97 111* 98 121*  BUN 22 16 17 17   CREATININE 0.82 0.71 0.79 1.12*  CALCIUM 8.8* 8.3* 9.0 9.1  MG 2.0  --  1.7 2.0   CBC: Recent Labs  Lab 06/06/22 0410 06/07/22 0658 06/08/22 0618 06/09/22 0406 06/10/22 0414  WBC 12.8* 19.8* 12.9* 14.5* 20.5*  HGB 9.4* 11.6* 11.5* 11.3* 11.4*  HCT 29.4* 36.8 36.7 35.4* 37.7  MCV 109.3* 110.5* 112.6* 109.9* 117.4*  PLT 190 212 152 143* 127*   CBG: Recent Labs  Lab 06/10/22 0108 06/10/22 0404 06/10/22 0719  06/10/22 1112 06/10/22 1607  GLUCAP 148* 119* 119* 121* 133*   Urine analysis:    Component Value Date/Time   COLORURINE YELLOW 06/02/2022 [place] 06/02/2022 1637   LABSPEC 1.016 06/02/2022 1637   PHURINE 5.0 06/02/2022 [place] 06/02/2022 1637   HGBUR NEGATIVE 06/02/2022 1637   BILIRUBINUR NEGATIVE 06/02/2022 1637   KETONESUR NEGATIVE 06/02/2022 1637   PROTEINUR NEGATIVE 06/02/2022 1637   UROBILINOGEN 1.0 07/16/2014 0244   NITRITE NEGATIVE 06/02/2022 1637   LEUKOCYTESUR NEGATIVE 06/02/2022 1637   Sepsis Labs:  Recent Results (from the past 240 hour(s))  Urine Culture     Status: None   Collection Time: 06/02/22  4:37 PM   Specimen: Urine, Catheterized  Result Value Ref Range Status   Specimen Description   Final    URINE, CATHETERIZED Performed at [facility], [address], [place], [place] [address]    Special Requests   Final    NONE Performed at [facility], [address] [place], [place] [address]    Culture   Final    NO GROWTH Performed at [facility], [place] [address], [place], [place] [address]    Report Status 06/03/2022 FINAL  Final     Scheduled Meds:  arformoterol  15 mcg Nebulization BID   aspirin  300 mg Rectal Daily   bisoprolol  10 mg Oral Daily   budesonide (PULMICORT) nebulizer solution  0.5 mg Nebulization BID   Chlorhexidine Gluconate Cloth  6  each Topical Q0600   citalopram  20 mg Oral Daily   clopidogrel  75 mg Oral Daily   feeding supplement  237 mL Oral TID BM   heparin  5,000 Units Subcutaneous Q8H   insulin aspart  0-9 Units Subcutaneous Q4H   losartan  12.5 mg Oral Daily   mirtazapine  7.5 mg Oral QHS   mupirocin ointment   Nasal BID   pantoprazole (PROTONIX) IV  40 mg Intravenous Q12H   phosphorus  500 mg Oral BID   polyethylene glycol  17 g Oral Daily   predniSONE  40 mg Oral Q breakfast   revefenacin  175 mcg Nebulization Daily   rosuvastatin  20 mg Oral QPM   saccharomyces  boulardii  250 mg Oral BID   sodium chloride flush  10-40 mL Intracatheter Q12H   spironolactone  12.5 mg Oral Daily   Continuous Infusions:  sodium chloride Stopped (06/06/22 2139)   dextrose 5 % with KCl 20 mEq / L 20 mEq (06/10/22 0942)    Procedures/Studies: CT HEAD WO CONTRAST (5MM)  Result Date: 06/10/2022 CLINICAL DATA:  Altered mental status EXAM: CT HEAD WITHOUT CONTRAST TECHNIQUE: Contiguous axial images were obtained from the base of the skull through the vertex without intravenous contrast. RADIATION DOSE REDUCTION: This exam was performed according to the departmental dose-optimization program which includes automated exposure control, adjustment of the mA and/or kV according to patient size and/or use of iterative reconstruction technique. COMPARISON:  None Available. FINDINGS: Brain: No evidence of hemorrhage, hydrocephalus, extra-axial collection or mass lesion/mass effect. There is extensive subcortical hypodensity in the bilateral cerebral hemispheres. There are age-indeterminate infarcts in the right cerebellum (series 3, image 4), left temporal lobe (series 3, image 8-9), the corona radiata on the left (series 3, image 16) and possibly the left lentiform nucleus (series 13, image 30). Vascular: No hyperdense vessel or unexpected calcification. Skull: Pneumatization of the right petrous apex. Sinuses/Orbits: Bilateral lens replacements. Assessment of the bilateral paranasal sinuses is limited due to the degree of motion artifact. Other: None. IMPRESSION: 1. There are multiple age-indeterminate infratentorial and bilateral supratentorial cortical hypodensities, worrisome for age indeterminate infarcts. Recommend further evaluation with a brain MRI. 2. There is extensive subcortical hypodensity, which is nonspecific, and may represent sequela of chronic microvascular ischemic change. This can also be assessed on follow up brian MRI. Electronically Signed   By: Marin Roberts M.D.   On:  06/10/2022 15:42   DG Chest Port 1 View  Result Date: 06/07/2022 CLINICAL DATA:  Acute respiratory failure with hypoxia and hypercarbia. EXAM: PORTABLE CHEST 1 VIEW COMPARISON:  06/03/2022. FINDINGS: The heart size and mediastinal contours are stable. There is atherosclerotic calcification of the aorta. Emphysematous changes are noted in the lungs. Coarse interstitial markings are present bilaterally and likely chronic. No consolidation, effusion, or pneumothorax. No acute osseous abnormality. IMPRESSION: 1. No active disease. 2. Emphysema. Electronically Signed   By: Brett Fairy M.D.   On: 06/07/2022 22:07   [place] EKG SITE RITE  Result Date: 06/04/2022 If Site Rite image not attached, placement could not be confirmed due to current cardiac rhythm.  [place] EKG SITE RITE  Result Date: 06/04/2022 If Site Rite image not attached, placement could not be confirmed due to current cardiac rhythm.  DG CHEST PORT 1 VIEW  Result Date: 06/03/2022 CLINICAL DATA:  NG tube placement.  Respiratory failure with hypoxia EXAM: PORTABLE CHEST 1 VIEW COMPARISON:  06/03/2022 FINDINGS: Endotracheal tube is 5.8 cm above the carina. NG  tube enters the stomach. Heart is normal size. Increased markings in the lung bases likely reflects scarring. No acute confluent opacities or effusions. Emphysematous changes. No acute bony abnormality. IMPRESSION: COPD. Bibasilar scarring. Support devices as above. Electronically Signed   By: Rolm Baptise M.D.   On: 06/03/2022 22:37   DG Chest Port 1 View  Result Date: 06/03/2022 CLINICAL DATA:  Acute respiratory failure with hypoxia EXAM: PORTABLE CHEST 1 VIEW COMPARISON:  06/01/2022 FINDINGS: Endotracheal and enteric tubes are again identified. Enteric tube has likely retracted some given location of side port. 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 Similar left greater than right interstitial changes. No significant pleural effusion. No pneumothorax. Stable cardiomediastinal contours.  IMPRESSION: Similar left greater than right patchy opacities superimposed on emphysema. Enteric tube has likely retracted as side port is now visible just below the gastroesophageal junction. Electronically Signed   By: Macy Mis M.D.   On: 06/03/2022 08:19   DG CHEST PORT 1 VIEW  Result Date: 06/01/2022 CLINICAL DATA:  Pneumonia EXAM: PORTABLE CHEST 1 VIEW COMPARISON:  Previous studies including the examination of 05/31/2022 FINDINGS: Tip of endotracheal tube is 7 cm above the carina. Enteric tube is noted traversing the esophagus. Cardiac size is within normal limits. There is slight improvement in infiltrates in left lung. There is possible slight worsening of infiltrate in right parahilar region. Bullous emphysema is seen in right upper lung field limiting evaluation for small pneumothorax. There is no significant pleural effusion. No definite pneumothorax is seen. IMPRESSION: There is decrease in extensive interstitial infiltrates in left lung. There is slight worsening of infiltrate in right parahilar region. Severe bullous emphysema in the right upper lung field. Electronically Signed   By: Elmer Picker M.D.   On: 06/01/2022 09:17   ECHOCARDIOGRAM LIMITED  Result Date: 05/31/2022    ECHOCARDIOGRAM LIMITED REPORT   Patient Name:   SHEKITA GOOGE Date of Exam: 05/31/2022 Medical Rec #:  [id number]         Height:       60.0 in Accession #:    [id number]        Weight:       90.2 lb Date of Birth:  08-08-1951         BSA:          1.330 m Patient Age:    42 years          BP:           125/55 mmHg Patient Gender: F                 HR:           101 bpm. Exam Location:  [facility] Procedure: Limited Echo Indications:    CHF-Acute Systolic [id number]  History:        Patient has prior history of Echocardiogram examinations, most                 recent 05/29/2022. Previous Myocardial Infarction and CAD, COPD                 and Stroke; Risk Factors:Hypertension and Dyslipidemia. Hx of                  CKD, COVID-10.  Sonographer:    Alvino Chapel RCS Referring Phys: [phone] DAVID TAT  Sonographer Comments: *Patient on mechanical ventilator at present. IMPRESSIONS  1. Left ventricular ejection fraction, by estimation, is 35 to 40%. The left ventricle has moderately  decreased function. The left ventricle demonstrates regional wall motion abnormalities (see scoring diagram/findings for description). The mid septal and all apical LV segments are akinetic (consistent with prior TTE).  2. Right ventricular systolic function is mildly reduced. The right ventricular size is normal.  3. The mitral valve is degenerative. Moderate mitral annular calcification. Comparison(s): Compared to prior TTE on 05/29/22, there is no significant change. FINDINGS  Left Ventricle: The mid septal and all apical LV segments are akinetic. Left ventricular ejection fraction, by estimation, is 35 to 40%. The left ventricle has moderately decreased function. The left ventricle demonstrates regional wall motion abnormalities. The left ventricular internal cavity size was normal in size. There is no left ventricular hypertrophy. Right Ventricle: The right ventricular size is normal. Right ventricular systolic function is mildly reduced. Pericardium: There is no evidence of pericardial effusion. Mitral Valve: The mitral valve is degenerative in appearance. There is mild thickening of the mitral valve leaflet(s). There is mild calcification of the mitral valve leaflet(s). Moderate mitral annular calcification. Aorta: The aortic root is normal in size and structure. LEFT VENTRICLE PLAX 2D LVIDd:         3.90 cm LVIDs:         3.30 cm LV PW:         0.90 cm LV IVS:        0.90 cm LVOT diam:     1.70 cm LVOT Area:     2.27 cm  LV Volumes (MOD) LV vol d, MOD A2C: 38.7 ml LV vol d, MOD A4C: 50.3 ml LV vol s, MOD A2C: 21.4 ml LV vol s, MOD A4C: 26.8 ml LV SV MOD A2C:     17.3 ml LV SV MOD A4C:     50.3 ml LV SV MOD BP:      20.7 ml LEFT ATRIUM          Index LA diam:    3.20 cm 2.41 cm/m   AORTA Ao Root diam: 2.50 cm  SHUNTS Systemic Diam: 1.70 cm Gwyndolyn Kaufman MD Electronically signed by Gwyndolyn Kaufman MD Signature Date/Time: 05/31/2022/1:31:58 PM    Final    DG Chest 1 View  Result Date: 05/31/2022 CLINICAL DATA:  Shortness of breath EXAM: CHEST  1 VIEW COMPARISON:  05/30/2022 FINDINGS: Endotracheal tube terminates 7 cm above the carina. Multifocal patchy left lung opacities, favoring pneumonia, possibly on the basis of aspiration. Right lung is essentially clear. No pleural effusion or pneumothorax. The heart is normal in size. Enteric tube courses into the stomach. Cholecystectomy clips. IMPRESSION: Endotracheal tube terminates 7 cm above the carina. Multifocal patchy left lung opacities, favoring pneumonia, possibly on the basis of aspiration. Electronically Signed   By: Julian Hy M.D.   On: 05/31/2022 03:54   DG CHEST PORT 1 VIEW  Result Date: 05/30/2022 CLINICAL DATA:  Endotracheal and orogastric tube. EXAM: PORTABLE CHEST 1 VIEW COMPARISON:  Chest CT 05/29/2022.  Chest x-ray 05/30/2009 3. FINDINGS: Endotracheal tube tip is 5 cm above the carina. Enteric tube tip is in the mid stomach. There are increasing central interstitial and airspace opacities throughout the left lung. Severe emphysematous changes are again seen in the right upper lobe. There is no pleural effusion or pneumothorax. No acute fractures are seen. Cardiomediastinal silhouette is within normal limits. IMPRESSION: 1. Endotracheal tube tip 5 cm above carina. 2. Enteric tube tip at the level of the mid stomach. 3. Increasing left lung interstitial and airspace opacities which may represent edema or infection. 4.  Stable severe emphysema. Electronically Signed   By: Ronney Asters M.D.   On: 05/30/2022 19:50   DG CHEST PORT 1 VIEW  Result Date: 05/30/2022 CLINICAL DATA:  Respiratory failure, pneumonia EXAM: PORTABLE CHEST 1 VIEW COMPARISON:  Previous studies including  chest radiograph done on 05/28/2022 and CT done on 05/29/2022 FINDINGS: There is diffuse increase in patchy interstitial and alveolar densities in both lungs, more so on the left side. There is relative sparing of right upper lung field due to underlying bullous emphysema. There is blunting of both lateral CP angles. There is no pneumothorax. IMPRESSION: There is diffuse increase in interstitial and alveolar markings in both lungs, more so on the left side. Findings suggest asymmetric pulmonary edema or worsening multifocal bilateral pneumonia. Small bilateral pleural effusions. Electronically Signed   By: Elmer Picker M.D.   On: 05/30/2022 10:05   ECHOCARDIOGRAM COMPLETE  Result Date: 05/29/2022    ECHOCARDIOGRAM REPORT   Patient Name:   KHYLA TROCHE Date of Exam: 05/29/2022 Medical Rec #:  [id number]         Height:       60.0 in Accession #:    [id number]        Weight:       88.2 lb Date of Birth:  03/21/1951         BSA:          1.318 m Patient Age:    74 years          BP:           143/66 mmHg Patient Gender: F                 HR:           113 bpm. Exam Location:  [facility] Procedure: 2D Echo, Cardiac Doppler and Color Doppler Indications:    Elevated Troponin  History:        Patient has prior history of Echocardiogram examinations, most                 recent 04/14/2018. CAD and Previous Myocardial Infarction, COPD                 and Stroke; Risk Factors:Hypertension and Dyslipidemia. Hx of                 CKD, COVID-10.  Sonographer:    Alvino Chapel RCS Referring Phys: [phone] DAVID TAT  Sonographer Comments: ** Patient on BiPAP and moving Alot during echo. IMPRESSIONS  1. Left ventricular ejection fraction, by estimation, is 35 to 40%. The left ventricle has moderately decreased function. The left ventricle demonstrates regional wall motion abnormalities (see scoring diagram/findings for description). Left ventricular  diastolic parameters are indeterminate.  2. Right ventricular systolic  function is mildly reduced. The right ventricular size is normal. There is moderately elevated pulmonary artery systolic pressure. The estimated right ventricular systolic pressure is [id number] mmHg.  3. The mitral valve is degenerative. Trivial mitral valve regurgitation.  4. The aortic valve was not well visualized. Aortic valve regurgitation is not visualized. No aortic stenosis is present.  5. The inferior vena cava is dilated in size with <50% respiratory variability, suggesting right atrial pressure of 15 mmHg. Conclusion(s)/Recommendation(s): Normal systolic function at base with apical akinesis, differential includes LAD infarct vs stress induced cardiomyopathy. FINDINGS  Left Ventricle: Left ventricular ejection fraction, by estimation, is 35 to 40%. The left ventricle has moderately decreased function. The left ventricle demonstrates regional wall  motion abnormalities. The left ventricular internal cavity size was normal in size. There is no left ventricular hypertrophy. Left ventricular diastolic parameters are indeterminate.  LV Wall Scoring: The mid and distal anterior septum, entire apex, and mid inferoseptal segment are akinetic. The anterior wall, antero-lateral wall, inferior wall, posterior wall, basal anteroseptal segment, and basal inferoseptal segment are normal. Right Ventricle: The right ventricular size is normal. Right vetricular wall thickness was not well visualized. Right ventricular systolic function is mildly reduced. There is moderately elevated pulmonary artery systolic pressure. The tricuspid regurgitant velocity is 3.00 m/s, and with an assumed right atrial pressure of 15 mmHg, the estimated right ventricular systolic pressure is [id number] mmHg. Left Atrium: Left atrial size was normal in size. Right Atrium: Right atrial size was normal in size. Pericardium: There is no evidence of pericardial effusion. Mitral Valve: The mitral valve is degenerative in appearance. Trivial mitral valve  regurgitation. Tricuspid Valve: The tricuspid valve is normal in structure. Tricuspid valve regurgitation is trivial. Aortic Valve: The aortic valve was not well visualized. Aortic valve regurgitation is not visualized. No aortic stenosis is present. Pulmonic Valve: The pulmonic valve was not well visualized. Pulmonic valve regurgitation is not visualized. Aorta: The aortic root is normal in size and structure. Venous: The inferior vena cava is dilated in size with less than 50% respiratory variability, suggesting right atrial pressure of 15 mmHg. IAS/Shunts: The interatrial septum was not well visualized.  LEFT VENTRICLE PLAX 2D LVIDd:         3.60 cm LVIDs:         2.50 cm LV PW:         0.90 cm LV IVS:        0.90 cm LVOT diam:     1.70 cm LV SV:         37 LV SV Index:   28 LVOT Area:     2.27 cm  LV Volumes (MOD) LV vol d, MOD A2C: 51.2 ml LV vol d, MOD A4C: 57.0 ml LV vol s, MOD A2C: 30.1 ml LV vol s, MOD A4C: 32.9 ml LV SV MOD A2C:     21.1 ml LV SV MOD A4C:     57.0 ml LV SV MOD BP:      24.6 ml RIGHT VENTRICLE TAPSE (M-mode): 1.2 cm LEFT ATRIUM             Index        RIGHT ATRIUM           Index LA diam:        2.60 cm 1.97 cm/m   RA Area:     10.40 cm LA Vol (A2C):   32.4 ml 24.58 ml/m  RA Volume:   25.30 ml  19.20 ml/m LA Vol (A4C):   32.5 ml 24.66 ml/m LA Biplane Vol: 35.2 ml 26.71 ml/m  AORTIC VALVE LVOT Vmax:   88.30 cm/s LVOT Vmean:  55.300 cm/s LVOT VTI:    0.164 m  AORTA Ao Root diam: 2.50 cm MITRAL VALVE                TRICUSPID VALVE MV Area (PHT): 7.16 cm     TR Peak grad:   36.0 mmHg MV Decel Time: 106 msec     TR Vmax:        300.00 cm/s MV E velocity: 136.00 cm/s  SHUNTS                             Systemic VTI:  0.16 m                             Systemic Diam: 1.70 cm Oswaldo Milian MD Electronically signed by Oswaldo Milian MD Signature Date/Time: 05/29/2022/2:52:39 PM    Final    CT Angio Chest Pulmonary Embolism (PE) W or WO  Contrast  Result Date: 05/29/2022 CLINICAL DATA:  Shortness of breath x2 weeks EXAM: CT ANGIOGRAPHY CHEST WITH CONTRAST TECHNIQUE: Multidetector CT imaging of the chest was performed using the standard protocol during bolus administration of intravenous contrast. Multiplanar CT image reconstructions and MIPs were obtained to evaluate the vascular anatomy. RADIATION DOSE REDUCTION: This exam was performed according to the departmental dose-optimization program which includes automated exposure control, adjustment of the mA and/or kV according to patient size and/or use of iterative reconstruction technique. CONTRAST:  48mL OMNIPAQUE IOHEXOL 350 MG/ML SOLN COMPARISON:  Previous studies including the chest radiograph done on 05/28/2022 FINDINGS: Cardiovascular: There is homogeneous enhancement in thoracic aorta. There are no intraluminal filling defects in central pulmonary artery branches. There are scattered coronary artery calcifications. Mediastinum/Nodes: No significant lymphadenopathy seen. There are small pockets of air in right subclavian vein, possibly introduced during venipuncture. Lungs/Pleura: Centrilobular and panlobular emphysema is seen. 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 There is small patchy infiltrate in the lateral aspect of posterior segment of right upper lobe. There is thickening of interlobular septi in the lower lung fields. There are patchy infiltrates in right middle lobe and both lower lobes. Small right pleural effusion is seen. There is minimal left pleural effusion. There is no pneumothorax. Upper Abdomen: There is 5.1 cm smooth marginated fluid density lesion in the upper pole of left kidney suggesting possible cyst. No follow-up is recommended. There are a few low-density lesions in liver measuring up to 11 mm in size which are not fully characterized, most likely cysts. No follow-up imaging is recommended. Musculoskeletal: No acute findings are seen. Review of the MIP  images confirms the above findings. IMPRESSION: There is no evidence of pulmonary artery embolism. There is no evidence of thoracic aortic dissection. Scattered coronary artery calcifications are seen. 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 There are small patchy infiltrates in posterior segment of right upper lobe, right middle lobe and both lower lobes suggesting multifocal atelectasis/pneumonia. Bilateral pleural effusions, more so on the right side. There are low-density lesions in left kidney and liver, possibly cysts. No follow-up imaging is recommended. Electronically Signed   By: Elmer Picker M.D.   On: 05/29/2022 13:04   DG Chest Portable 1 View  Result Date: 05/28/2022 CLINICAL DATA:  Shortness of breath for 2 weeks.  COVID positive. EXAM: PORTABLE CHEST 1 VIEW COMPARISON:  05/02/2022 FINDINGS: Lungs are hyperinflated. Significant bullous changes at the RIGHT lung apex, similar to prior. There are no focal consolidations or pleural effusions. No pulmonary edema. IMPRESSION: Hyperinflation of the lungs. No evidence for acute pulmonary abnormality. Electronically Signed   By: Nolon Nations M.D.   On: 05/28/2022 17:12    Darrik Richman Denton Brick, MD  [facility]  If 7PM-7AM, please contact night-coverage [email or web address] Password [email or web address] 06/10/2022, 5:00 PM   LOS: 13 days

## 2022-06-10 NOTE — Progress Notes (Signed)
MD Courage notified that patient is still not responding much other than stating "yeah" when saying her name. Patient will not follow commands to complete the neuro checks.

## 2022-06-10 NOTE — Progress Notes (Signed)
Upon entering room to provide pt care, pt noted to be kicking feet in the air talking to herself. Before providing any care pt started to foam at the mouth and started shaking uncontrollably which lasted around 30 seconds. Rapid response immediately called. Charge nurse called down to room. Glucose and vitals checked. EKG performed. MD provided new orders. Seizure mats placed on bed. Red MEWS protocol still in place from earlier in the shift. Will continue to monitor this pt closely.

## 2022-06-10 NOTE — Consult Note (Signed)
Consultation Note Date: 06/10/2022   Patient Name: [name]  DOB: 10-Feb-1951  MRN: [id number]  Age / Sex: 71 y.o., female  PCP: Carrolyn Meiers, MD Referring Physician: Roxan Hockey, MD  Reason for Consultation: Establishing goals of care  HPI/Patient Profile: 71 y.o. 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  Consulted 9/20 for seizure, neurology work-up.  She had been planning for short-term rehab at [facility], ready to discharge.  Clinical Assessment and Goals of Care: I have reviewed medical records including EPIC notes, labs and imaging, received report from RN, assessed the patient.  Ms. Rajagopalan is resting quietly in bed.  She appears acutely ill.  She is noted to have seizure activity overnight, and does not respond in any meaningful way to voice or touch.  She clearly cannot make her basic needs known.  There is no family at bedside at this time.  Call to son/healthcare surrogate, Rico Junker, to discuss diagnosis prognosis, GOC, EOL wishes, disposition and options.  No answer, left somewhat generic voicemail message.    Conference with attending, bedside nursing staff, transition of care team related to patient condition, needs, goals of care,  disposition.   HCPOA NEXT OF KIN -son, Donetta Potts.    SUMMARY OF RECOMMENDATIONS   At this point continue full scope/full code Agreeable to short-term rehab at [facility] Would benefit from outpatient palliative services.   Code Status/Advance Care Planning: Full code -would benefit from [place] discussions.  Unable to reach son, patient unable to participate at this time.  Symptom Management:  Per hospitalist, no additional needs at this time.  Palliative Prophylaxis:  Oral Care and Turn Reposition  Additional Recommendations (Limitations, Scope, Preferences): Full Scope Treatment  Psycho-social/Spiritual:  Desire for further Chaplaincy support:no Additional Recommendations: Caregiving  Support/Resources  Prognosis:  Unable to determine, based on outcomes.  Discharge Planning: Anticipate discharge to short-term rehab, outpatient palliative would be of benefit      Primary Diagnoses: Present on Admission:  Essential hypertension  Gastroesophageal reflux disease   I have reviewed the medical record, interviewed the patient and family, and examined the patient. The following aspects are pertinent.  Past Medical History:  Diagnosis Date   Anxiety    Blind    right   Colitis 09/07/2011   CVA (cerebral infarction)    GERD (gastroesophageal reflux disease)    Hyperlipidemia    Hypertension    Myocardial infarct ([place])    1997, 2004   Severe major depression with psychotic features (  [place])    2004   Stroke [facility]) 2002   Social History   Socioeconomic History   Marital status: Widowed    Spouse name: Not on file   Number of children: 3   Years of education: Not on file   Highest education level: Not on file  Occupational History   Occupation: disabled, Advertising account planner  Tobacco Use   Smoking status: Former    Packs/day: 0.25    Years: 40.00    Total pack years: 10.00    Types: Cigarettes    Quit date: 08/02/2017    Years since quitting: 4.8    Smokeless tobacco: Never  Vaping Use   Vaping Use: Never used  Substance and Sexual Activity   Alcohol use: No    Alcohol/week: 0.0 standard drinks of alcohol   Drug use: Yes    Types: Marijuana   Sexual activity: Not Currently    Birth control/protection: Post-menopausal  Other Topics Concern   Not on file  Social History Narrative   Lives alone   Social Determinants of Health   Financial Resource Strain: Not on file  Food Insecurity: No Food Insecurity (05/29/2022)   Hunger Vital Sign    Worried About Running Out of Food in the Last Year: Never true    [place] in the Last Year: Never true  Transportation Needs: No Transportation Needs (05/29/2022)   PRAPARE - Hydrologist (Medical): No    Lack of Transportation (Non-Medical): No  Physical Activity: Not on file  Stress: Not on file  Social Connections: Not on file   Family History  Problem Relation Age of Onset   Multiple sclerosis Father    Alzheimer's disease Mother    Hypertension Mother    Diabetes Mother    Colon cancer Neg Hx    Scheduled Meds:  arformoterol  15 mcg Nebulization BID   bisoprolol  10 mg Oral Daily   budesonide (PULMICORT) nebulizer solution  0.5 mg Nebulization BID   Chlorhexidine Gluconate Cloth  6 each Topical Q0600   citalopram  20 mg Oral Daily   clopidogrel  75 mg Oral Daily   feeding supplement  237 mL Oral TID BM   heparin  5,000 Units Subcutaneous Q8H   insulin aspart  0-9 Units Subcutaneous Q4H   losartan  12.5 mg Oral Daily   mirtazapine  7.5 mg Oral QHS   mupirocin ointment   Nasal BID   pantoprazole (PROTONIX) IV  40 mg Intravenous Q12H   phosphorus  500 mg Oral BID   polyethylene glycol  17 g Oral Daily   predniSONE  40 mg Oral Q breakfast   revefenacin  175 mcg Nebulization Daily   rosuvastatin  20 mg Oral QPM   saccharomyces boulardii  250 mg Oral BID   sodium chloride flush  10-40 mL Intracatheter Q12H   spironolactone  12.5 mg Oral Daily    Continuous Infusions:  sodium chloride Stopped (06/06/22 2139)   dextrose 5 % with KCl 20 mEq / L 20 mEq (06/10/22 0942)   PRN Meds:.sodium chloride, acetaminophen **OR** acetaminophen, albuterol, diphenoxylate-atropine, fentaNYL, fentaNYL (SUBLIMAZE) injection, haloperidol lactate, hydrALAZINE, LORazepam, LORazepam, metoprolol tartrate, nitroGLYCERIN, ondansetron **OR** ondansetron (ZOFRAN) IV, mouth rinse, polyethylene glycol, polyvinyl alcohol, sodium chloride flush Medications Prior to Admission:  Prior to Admission medications   Medication Sig Start Date End Date Taking? Authorizing Provider  acetaminophen (TYLENOL) 325 MG tablet Take 2 tablets (650 mg total) by mouth every 6 (  six) hours as needed for mild pain or headache (or Fever >/= 101). 05/09/22  Yes Barton Dubois, MD  ascorbic acid (VITAMIN C) 500 MG tablet Take 1 tablet (500 mg total) by mouth daily. 05/10/22  Yes Barton Dubois, MD  aspirin EC 81 MG tablet Take 81 mg by mouth daily.     Yes [provider]  atorvastatin (LIPITOR) 20 MG tablet Take 1 tablet (20 mg total) by mouth daily. 05/10/22  Yes Barton Dubois, MD  citalopram (CELEXA) 20 MG tablet Take 20 mg by mouth daily. 03/31/22  Yes [provider]  cloNIDine (CATAPRES) 0.1 MG tablet Take 3 tablets (0.3 mg total) by mouth 2 (two) times daily. 05/09/22  Yes Barton Dubois, MD  dextromethorphan-guaiFENesin [facility] DM) 30-600 MG per 12 hr tablet Take 1 tablet by mouth 2 (two) times daily.   Yes [provider]  docusate sodium (COLACE) 100 MG capsule Take 100 mg by mouth daily.     Yes [provider]  folic acid (FOLVITE) 1 MG tablet Take 1 tablet (1 mg total) by mouth daily. 10/17/21  Yes Tat, Shanon Brow, MD  Ipratropium-Albuterol (COMBIVENT) 20-100 MCG/ACT AERS respimat Inhale 1 puff into the lungs every 6 (six) hours as needed for wheezing or shortness of breath. 05/09/22  Yes Barton Dubois, MD  loratadine (CLARITIN) 10 MG tablet Take 10 mg by  mouth daily. 03/12/22  Yes [provider]  metoprolol tartrate (LOPRESSOR) 50 MG tablet Take 50 mg by mouth 2 (two) times daily. 08/10/21  Yes [provider]  mirtazapine (REMERON) 7.5 MG tablet Take 7.5 mg by mouth at bedtime. 04/27/22  Yes [provider]  pantoprazole (PROTONIX) 40 MG tablet Take 1 tablet (40 mg total) by mouth 2 (two) times daily. 05/09/22 05/09/23 Yes Barton Dubois, MD  vitamin B-12 (CYANOCOBALAMIN) 500 MCG tablet Take 1 tablet (500 mcg total) by mouth daily. 10/16/21  Yes Tat, Shanon Brow, MD  zinc sulfate 220 (50 Zn) MG capsule Take 1 capsule (220 mg total) by mouth daily. 05/10/22  Yes Barton Dubois, MD  megestrol (MEGACE) 400 MG/10ML suspension Take 10 mLs (400 mg total) by mouth 2 (two) times daily. Patient not taking: Reported on 05/29/2022 05/09/22   Barton Dubois, MD  predniSONE (DELTASONE) 10 MG tablet Take 4 tablets by mouth daily x1 day; then 3 tablet by mouth daily x1 day; then 2 tablet by mouth daily x2 days; then 1 tablet by mouth daily x3 days and stop prednisone. Patient not taking: Reported on 05/29/2022 05/09/22   Barton Dubois, MD   Allergies  Allergen Reactions   Aspirin Swelling and Other (See Comments)    Only in large doses will cause a reaction   Bee Venom Swelling    Severe swelling   Contrast Media [Iodinated Contrast Media] Swelling   Lisinopril Swelling   Motrin [Ibuprofen] Swelling   Penicillins Anaphylaxis   Dilaudid [Hydromorphone Hcl] Itching   Review of Systems  Unable to perform ROS: Mental status change    Physical Exam Vitals and nursing note reviewed.  Constitutional:      General: She is not in acute distress.    Appearance: She is ill-appearing.  Cardiovascular:     Rate and Rhythm: Normal rate.  Pulmonary:     Effort: Pulmonary effort is normal. No tachypnea.  Skin:    General: Skin is warm and dry.  Neurological:     Comments: Postictal, does not respond     Vital Signs: BP (!) 142/85 (BP Location:  Right Arm)   Pulse 100   Temp (!) 97.5 F (36.4 C) (Axillary)   Resp 17   Ht 5' (1.524 m)   Wt 44.8 kg   SpO2 99%   BMI 19.29 kg/m  Pain Scale: 0-10   Pain Score: Asleep   SpO2: SpO2: 99 % O2 Device:SpO2: 99 % O2 Flow Rate: .O2 Flow Rate (L/min): 1 L/min  IO: Intake/output summary:  Intake/Output Summary (Last 24 hours) at 06/10/2022 1443 Last data filed at 06/10/2022 1100 Gross per 24 hour  Intake 170 ml  Output 350 ml  Net -180 ml    LBM: Last BM Date : 06/06/22 Baseline Weight: Weight: 40 kg Most recent weight: Weight: 44.8 kg     Palliative Assessment/Data:   Flowsheet Rows    Flowsheet Row Most Recent Value  Intake Tab   Referral Department Hospitalist  Unit at Time of Referral Cardiac/Telemetry Unit  Palliative Care Primary Diagnosis Pulmonary  Date Notified 06/10/22  Palliative Care Type New Palliative care  Reason for referral Clarify Goals of Care  Date of Admission 05/28/22  Date first seen by Palliative Care 06/10/22  # of days Palliative referral response time 0 Day(s)  # of days IP prior to Palliative referral 13  Clinical Assessment   Palliative Performance Scale Score 20%  Pain Max last 24 hours Not able to report  Pain Min Last 24 hours Not able to report  Dyspnea Max Last 24 Hours Not able to report  Dyspnea Min Last 24 hours Not able to report  Psychosocial & Spiritual Assessment   Palliative Care Outcomes        Time In: 1300 Time Out: 1355 Time Total: 55 minutes  Greater than 50%  of this time was spent counseling and coordinating care related to the above assessment and plan.  Signed by: Drue Novel, NP   Please contact Palliative Medicine Team phone at [phone] for questions and concerns.  For individual provider: See Shea Evans

## 2022-06-10 NOTE — Progress Notes (Signed)
Rapid response called minutes after 0400. Patient was found by her primary nurse shaking and white foam at the mouth. Decorticate posturing of upper extremities noted when entering the room. Pulse thready and EKG showed heart rate 150s. MD provided new orders,.

## 2022-06-10 NOTE — Procedures (Signed)
Patient Name: [name]  MRN: [id number]  Epilepsy Attending: Lora Havens  Referring Physician/Provider: Rolla Plate, DO  Date: 06/10/2022 Duration: 21.57 mins  Patient history: 71yo F with seizure like activity. EEG to evaluate for seizure  Level of alertness: lethargic   AEDs during EEG study: Ativan  Technical aspects: This EEG study was done with scalp electrodes positioned according to the 10-20 International system of electrode placement. Electrical activity was reviewed with band pass filter of 1-70Hz , sensitivity of 7 uV/mm, display speed of 63mm/sec with a 60Hz  notched filter applied as appropriate. EEG data were recorded continuously and digitally stored.  Video monitoring was available and reviewed as appropriate.  Description: No posterior dominant rhythm was seen. EEG showed continuous generalized polymorphic 3 to 6 Hz theta-delta slowing. Generalized periodic discharges with triphasic morphology at 1-1.5 Hz were also noted, more prominent when awake/stimulated. Hyperventilation and photic stimulation were not performed.     ABNORMALITY - Periodic discharges with triphasic morphology, generalized ( GPDs) - Continuous slow, generalized  IMPRESSION: This study showed generalized periodic discharges with triphasic morphology which can be on the ictal-interictal continuum. However, the morphology, frequency and reactivity to stimulation is more commonly indicative of toxic-metabolic causes. Additionally there was  moderate diffuse encephalopathy, nonspecific etiology. No seizures were seen throughout the recording.  [name] Barbra Sarks

## 2022-06-11 ENCOUNTER — Inpatient Hospital Stay (HOSPITAL_COMMUNITY): Payer: Medicare Other

## 2022-06-11 DIAGNOSIS — J9602 Acute respiratory failure with hypercapnia: Secondary | ICD-10-CM | POA: Diagnosis not present

## 2022-06-11 DIAGNOSIS — Z515 Encounter for palliative care: Secondary | ICD-10-CM | POA: Diagnosis not present

## 2022-06-11 DIAGNOSIS — Z7189 Other specified counseling: Secondary | ICD-10-CM | POA: Diagnosis not present

## 2022-06-11 DIAGNOSIS — J9601 Acute respiratory failure with hypoxia: Secondary | ICD-10-CM | POA: Diagnosis not present

## 2022-06-11 LAB — URINALYSIS, ROUTINE W REFLEX MICROSCOPIC
Bilirubin Urine: NEGATIVE
Glucose, UA: NEGATIVE mg/dL
Hgb urine dipstick: NEGATIVE
Ketones, ur: NEGATIVE mg/dL
Leukocytes,Ua: NEGATIVE
Nitrite: NEGATIVE
Protein, ur: 30 mg/dL — AB
Specific Gravity, Urine: 1.015 (ref 1.005–1.030)
pH: 6 (ref 5.0–8.0)

## 2022-06-11 LAB — GLUCOSE, CAPILLARY
Glucose-Capillary: 114 mg/dL — ABNORMAL HIGH (ref 70–99)
Glucose-Capillary: 98 mg/dL (ref 70–99)
Glucose-Capillary: 118 mg/dL — ABNORMAL HIGH (ref 70–99)
Glucose-Capillary: 99 mg/dL (ref 70–99)
Glucose-Capillary: 84 mg/dL (ref 70–99)

## 2022-06-11 LAB — COMPREHENSIVE METABOLIC PANEL
ALT: 70 U/L — ABNORMAL HIGH (ref 0–44)
AST: 34 U/L (ref 15–41)
Albumin: 2.3 g/dL — ABNORMAL LOW (ref 3.5–5.0)
Alkaline Phosphatase: 49 U/L (ref 38–126)
Anion gap: 6 (ref 5–15)
BUN: 10 mg/dL (ref 8–23)
CO2: 31 mmol/L (ref 22–32)
Calcium: 7.7 mg/dL — ABNORMAL LOW (ref 8.9–10.3)
Chloride: 99 mmol/L (ref 98–111)
Creatinine, Ser: 0.88 mg/dL (ref 0.44–1.00)
GFR, Estimated: >60 mL/min (ref >60)
Glucose, Bld: 334 mg/dL — ABNORMAL HIGH (ref 70–99)
Potassium: 4.3 mmol/L (ref 3.5–5.1)
Sodium: 136 mmol/L (ref 135–145)
Total Bilirubin: 1.2 mg/dL (ref 0.3–1.2)
Total Protein: 4.6 g/dL — ABNORMAL LOW (ref 6.5–8.1)

## 2022-06-11 LAB — CBC WITH DIFFERENTIAL/PLATELET
Abs Immature Granulocytes: 0.05 10*3/uL (ref 0.00–0.07)
Basophils Absolute: 0 10*3/uL (ref 0.0–0.1)
Basophils Relative: 0 %
Eosinophils Absolute: 0 10*3/uL (ref 0.0–0.5)
Eosinophils Relative: 0 %
HCT: 29.3 % — ABNORMAL LOW (ref 36.0–46.0)
Hemoglobin: 9.2 g/dL — ABNORMAL LOW (ref 12.0–15.0)
Immature Granulocytes: 0 %
Lymphocytes Relative: 10 %
Lymphs Abs: 1.3 10*3/uL (ref 0.7–4.0)
MCH: 35 pg — ABNORMAL HIGH (ref 26.0–34.0)
MCHC: 31.4 g/dL (ref 30.0–36.0)
MCV: 111.4 fL — ABNORMAL HIGH (ref 80.0–100.0)
Monocytes Absolute: 1 10*3/uL (ref 0.1–1.0)
Monocytes Relative: 7 %
Neutro Abs: 11.1 10*3/uL — ABNORMAL HIGH (ref 1.7–7.7)
Neutrophils Relative %: 83 %
Platelets: 87 10*3/uL — ABNORMAL LOW (ref 150–400)
RBC: 2.63 MIL/uL — ABNORMAL LOW (ref 3.87–5.11)
RDW: 15.8 % — ABNORMAL HIGH (ref 11.5–15.5)
WBC: 13.5 10*3/uL — ABNORMAL HIGH (ref 4.0–10.5)
nRBC: 0 % (ref 0.0–0.2)

## 2022-06-11 LAB — FOLATE: Folate: 19.4 ng/mL (ref >5.9)

## 2022-06-11 LAB — BLOOD GAS, ARTERIAL
Acid-Base Excess: 15.2 mmol/L — ABNORMAL HIGH (ref 0.0–2.0)
Bicarbonate: 40.1 mmol/L — ABNORMAL HIGH (ref 20.0–28.0)
Drawn by: 27407
FIO2: 24 %
O2 Saturation: 98.3 %
Patient temperature: 37.3
pCO2 arterial: 49 mmHg — ABNORMAL HIGH (ref 32–48)
pH, Arterial: 7.53 — ABNORMAL HIGH (ref 7.35–7.45)
pO2, Arterial: 79 mmHg — ABNORMAL LOW (ref 83–108)

## 2022-06-11 LAB — CK: Total CK: 78 U/L (ref 38–234)

## 2022-06-11 LAB — PHOSPHORUS: Phosphorus: 2.4 mg/dL — ABNORMAL LOW (ref 2.5–4.6)

## 2022-06-11 LAB — PROCALCITONIN: Procalcitonin: 0.21 ng/mL

## 2022-06-11 MED ORDER — CLOPIDOGREL BISULFATE 75 MG PO TABS
75.0000 mg | ORAL_TABLET | Freq: Every day | ORAL | Status: DC
  Administered 2022-06-14 – 2022-06-17 (×4): 75 mg via ORAL
  Filled 2022-06-11 (×4): qty 1

## 2022-06-11 MED ORDER — LOSARTAN POTASSIUM 25 MG PO TABS
12.5000 mg | ORAL_TABLET | Freq: Every day | ORAL | Status: DC
  Administered 2022-06-14 – 2022-06-17 (×4): 12.5 mg via ORAL
  Filled 2022-06-11 (×4): qty 1

## 2022-06-11 MED ORDER — HYDRALAZINE HCL 20 MG/ML IJ SOLN
20.0000 mg | Freq: Four times a day (QID) | INTRAMUSCULAR | Status: DC | PRN
  Administered 2022-06-11 – 2022-06-13 (×2): 20 mg via INTRAVENOUS
  Filled 2022-06-11 (×2): qty 1

## 2022-06-11 MED ORDER — BISOPROLOL FUMARATE 5 MG PO TABS: 10.0000 mg | ORAL_TABLET | Freq: Every day | ORAL | Status: DC

## 2022-06-11 MED ORDER — DEXTROSE-NACL 5-0.45 % IV SOLN: INTRAVENOUS | Status: DC

## 2022-06-11 MED ORDER — ROSUVASTATIN CALCIUM 20 MG PO TABS
20.0000 mg | ORAL_TABLET | Freq: Every evening | ORAL | Status: DC
  Administered 2022-06-14 – 2022-06-16 (×3): 20 mg via ORAL
  Filled 2022-06-11 (×3): qty 1

## 2022-06-11 MED ORDER — CITALOPRAM HYDROBROMIDE 10 MG PO TABS
20.0000 mg | ORAL_TABLET | Freq: Every day | ORAL | Status: DC
  Administered 2022-06-14 – 2022-06-17 (×4): 20 mg via ORAL
  Filled 2022-06-11 (×4): qty 2

## 2022-06-11 MED ORDER — LORAZEPAM 2 MG/ML IJ SOLN
1.0000 mg | Freq: Once | INTRAMUSCULAR | Status: AC
  Administered 2022-06-11: 1 mg via INTRAVENOUS

## 2022-06-11 MED ORDER — METOPROLOL TARTRATE 5 MG/5ML IV SOLN
5.0000 mg | Freq: Three times a day (TID) | INTRAVENOUS | Status: DC
  Administered 2022-06-11 – 2022-06-17 (×17): 5 mg via INTRAVENOUS
  Filled 2022-06-11 (×17): qty 5

## 2022-06-11 MED ORDER — SPIRONOLACTONE 12.5 MG HALF TABLET
12.5000 mg | ORAL_TABLET | Freq: Every day | ORAL | Status: DC
  Administered 2022-06-14 – 2022-06-17 (×4): 12.5 mg via ORAL
  Filled 2022-06-11 (×4): qty 1

## 2022-06-11 MED FILL — Medication: Qty: 1 | Status: AC

## 2022-06-11 NOTE — Progress Notes (Signed)
PROGRESS NOTE  VERONE PARI [id number] DOB: 1951/02/20 DOA: 05/28/2022 PCP: Carrolyn Meiers, MD  Brief History:  71 year old female with a history of coronary disease, hypertension, hyperlipidemia, functional constipation, B12 and folate deficiency, anxiety, and prior stroke presenting with shortness of breath and respiratory failure.  The patient was recently mid to the hospital from 05/02/2022 to 05/09/2022 when she had acute respiratory failure secondary to COVID-19.  During that hospitalization, the patient was also noted to have colitis for which she was treated with levofloxacin and metronidazole.  She completed a course of antibiotics prior to discharge.  She was discharged home with a prednisone taper. She states that she has had some shortness of breath since discharge from the hospital, but this has significantly worsened in the 24 hours prior to admission.  Upon arrival to the emergency department, the patient was noted to have respiratory distress patient was placed on BiPAP. Repeat ABG on BiPAP did not show significant improvement.  There was plans for intubation, but the patient was given Ativan with improvement clinically.  She remained on BiPAP overnight.  Notably, the patient states that she started having some burning chest discomfort on the morning of 05/28/2022.  She states that it somewhat improved while she was on BiPAP, but continues to have the chest discomfort.  She was taken off of BiPAP in the morning of 05/29/2022 and stated that she had some worsening of her burning chest discomfort.  She developed some increased shortness of breath resulting in placement back on BiPAP on the morning of 05/29/22.  Troponins were noted to be 375>> 478.  D-dimer was also noted to be elevated 3.97.  The patient was started on IV heparin. The patient was given Solu-Medrol 125 mg IV, morphine 1 mg IV and placed back on BiPAP.  Repeat EKG showed sinus tachycardia with T wave  inversion V2-V5.  Troponins continue to be cycled. cardiology was consulted to assist with management.   On the evening of 05/30/2022, the patient developed respiratory distress and hypoxia on BiPAP.  She remained agitated despite pharmacologic and nonpharmacologic interventions.  After discussion with the patient's family, decision was made to intubate the patient.  Repeat limited echocardiogram was ordered for 05/31/22.   06/11/22 -?? Seizures --Neurology consult from Dr. Hortense Ramal dated 06/10/2022 appreciated -Neurology consult from Dr. Oliver Barre dated 06/11/2022 appreciated --EEG from 06/10/22 without definite epileptiform findings -Neurology team recommends transfer to [facility] for continuous EEG monitoring  to rule out seizures  Assessment and Plan: 1) acute metabolic encephalopathy----?? Seizures --Neurology consult from Dr. Hortense Ramal dated 06/10/2022 appreciated -Neurology consult from Dr. Oliver Barre dated 06/11/2022 appreciated --EEG from 06/10/22 without definite epileptiform findings -Neurology team recommends transfer to [facility] for continuous EEG monitoring  to rule out seizures -CT head without acute bleed -Discussed with pharmacist okay to give rectal aspirin -MRI brain, MRA head and neck "distorted" by motion artifact however no acute strokes or LVO noted 06/11/22 -UA is not suggestive of UTI -Chest x-ray without definite pneumonia -WBC and procalcitonin actually trending down -Repeat blood cultures for completeness sake CK 78 -N.p.o. for now  2)Social/Ethics-- I Discussed with her son Elta Guadeloupe  [phone]  Bonne Dolores calling son [name] [phone], no answer -Remains a full code -Palliative care consult requested  3)Acute respiratory failure with hypoxia and hypercarbia ([place]) - Presented with respiratory distress with oxygen saturation 77% on room air.  Appears to be secondary to COPD exacerbation -  Patient failed the use of BiPAP and in the requiring to be  intubated and mechanically ventilated.  (05/30/2022) -CTA chest--neg PE, severe emphysema; multifocal patchy infiltrates RUL, RLL, LLL -Patient has been extubated (06/04/2022);  -Currently on 2 L nasal cannula supplementation   -Has completed antibiotic therapy on 06/07/2022. -Pulmonology consult appreciated -Cardiology consult appreciated initially diuresed --ABG with metabolic alkalosis, PCO2 borderline at 49 06/11/22 -Patient with significant altered mentation unable to tolerate oral intake at this time risk for dehydration avoid aggressive diuresis at this time  4)COPD with acute exacerbation ([place]) -Although the patient denies a history of COPD and has not had formal PFTs--> she endorses smoking over 30 years, 1/2 pack/day. 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 -EKG--sinus rhythm, T wave depression V2-V5 -9/8 echo--EF 35-40%, apical HK, trivial MR/TR -9/10--repeat limited Echo-EF 35-40%, unchanged -9/11 started on Plavix after finalization of heparin drip. 06/11/22---unable to take oral intake so bisoprolol was changed to IV metoprolol, -Aspirin changed to rectal aspirin suppository and -Plavix on hold -Cardiology was planning on possibly doing cardiac catheterization once medically stable and able to lay down flat.  Lobar pneumonia ([place]) -CTA chest as discussed above -MRSA+ -Has completed antibiotic therapy on 06/07/2022. -PCT 2.19 >> 0.21 -Tracheal aspiration without any growth. -Continue supportive care. WBC 20.5 >> 13.5 -Repeat chest x-ray on 06/11/2022 without definite pneumonia  Hypernatremia --Sodium is normalized - continue IV fluids until oral intake resumes  Impaired glucose tolerance -05/07/22 A1C--5.4 -Continue sliding  scale insulin - Hypophosphatemia - Continue to follow electrolytes and replete as needed.  Hyperkalemia -Given lokelma and now receiving intermittent dosages of diuretics. -Potassium normalized -Continue telemetry monitoring and follow electrolytes trend.   Cardiomyopathy [facility]) -9/8 echo--EF 35-40%, apical HK, trivial MR/TR -Discussed with cardiology>>suspect Takatsubo's  -finish 48 hours IV heparin 9/11>> patient has been started on Plavix  -06/11/22 Currently N.p.o. restart Plavix when oral intake resumes Repeat limited Echo EF 35-40%, unchanged  Malnutrition of moderate degree -Advance diet -Count calories intake -Follow response.  Anemia -No signs of active blood loss -baseslin Hgb 10-11 -B12--674 -Folate 35.6 -Iron sat 37, ferritin 7392 -Hgb trending down with hydration suspect hemodilution  Chronic kidney disease, stage 3a (HCC) -Baseline creatinine 0.8-1.1 -Continue to follow renal function trend/stability.  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 -Continue risk factor modification. -Currently with altered mentation  Essential hypertension --Bisoprolol on hold due to n.p.o. status okay to use IV metoprolol for now -May use IV hydralazine as needed elevated BP -Appreciate assistance and recommendation by cardiology service.   Family Communication: -Discussed with her son Elta Guadeloupe  [phone]  Bonne Dolores calling son [name] [phone], no answer   Consultants:  pulm, cardiology   Code Status:  FULL    DVT Prophylaxis:  [place] Heparin      Procedures: As Listed in Progress Note Above   Antibiotics: Vanc 9/8>>9/17 Aztreonam 9/8>>9/17 Azithro 9/8>>9/15     Subjective: 06/11/22 -?? Seizures -Overnight patient again had episodes of tonic-clonic movements of her upper body and torso.... Marland Kitchen. Currently sleepy after Ativan.Marland KitchenMarland Kitchen??  Postictal state --Neurology consult from Dr.  Hortense Ramal dated 06/10/2022 appreciated -Neurology consult from Dr. Oliver Barre dated 06/11/2022 appreciated --EEG from 06/10/22 without definite epileptiform findings -Neurology team recommends transfer to [facility] for continuous  EEG monitoring  to rule out seizures -Concerns for possible acute stroke versus seizures -Patient is largely nonverbal... Largely unresponsive at this time -No fevers, no emesis  Objective: Vitals:   06/11/22 1100 06/11/22 1200 06/11/22 1500 06/11/22 1600  BP: (!) 169/94 (!) 176/68 (!) 154/87 (!) 156/77  Pulse: 90 73 73 (!) 58  Resp: (!) 22 (!) 22 (!) 23 (!) 22  Temp: 98.2 F (36.8 C)   98.2 F (36.8 C)  TempSrc: Axillary   Axillary  SpO2: 100% 100% 100% 100%  Weight:      Height:        Intake/Output Summary (Last 24 hours) at 06/11/2022 1653 Last data filed at 06/11/2022 1644 Gross per 24 hour  Intake 2164.05 ml  Output 1250 ml  Net 914.05 ml   Weight change:   Exam:  Physical Exam  Gen:-lethargic sleepy, responds to tactile stimuli , occasionally responds to verbal stimuli by squeezing hand  HEENT:- [place].AT, No sclera icterus, at baseline Rt eye residual vision loss from prior stroke Neck-Supple Neck,No JVD,.  Lungs-fair air movement, no wheezing  CV- S1, S2 normal, RRR Abd-  +ve B.Sounds, Abd Soft, No tenderness,    Extremity/Skin:- No  edema,   good pedal pulses  Neuro-Psych-lethargic, largely unresponsive at this time occasionally mumbles, will squeeze hands at times -Limited exam as patient is not able to follow commands consistently, patient moves extremities spontaneously  Data Reviewed: I have personally reviewed following labs and imaging studies  Basic Metabolic Panel: Recent Labs  Lab 06/07/22 0603 06/08/22 0921 06/09/22 0406 06/10/22 0414 06/11/22 0624  NA 146* 145 148* 148* 136  K 3.3* 3.1* 3.2* 3.4* 4.3  CL 112* 115* 112* 108 99  CO2 26 21* 30 30 31   GLUCOSE 97 111* 98 121* 334*  BUN 22 16 17 17 10   CREATININE  0.82 0.71 0.79 1.12* 0.88  CALCIUM 8.8* 8.3* 9.0 9.1 7.7*  MG 2.0  --  1.7 2.0  --   PHOS  --   --   --   --  2.4*   CBC: Recent Labs  Lab 06/07/22 0658 06/08/22 0618 06/09/22 0406 06/10/22 0414 06/11/22 0624  WBC 19.8* 12.9* 14.5* 20.5* 13.5*  NEUTROABS  --   --   --   --  11.1*  HGB 11.6* 11.5* 11.3* 11.4* 9.2*  HCT 36.8 36.7 35.4* 37.7 29.3*  MCV 110.5* 112.6* 109.9* 117.4* 111.4*  PLT 212 152 143* 127* 87*   CBG: Recent Labs  Lab 06/10/22 2345 06/11/22 0308 06/11/22 0452 06/11/22 0732 06/11/22 1126  GLUCAP 121* 114* 98 118* 99   Urine analysis:    Component Value Date/Time   COLORURINE YELLOW 06/11/2022 [place] 06/11/2022 0624   LABSPEC 1.015 06/11/2022 0624   PHURINE 6.0 06/11/2022 0624   GLUCOSEU NEGATIVE 06/11/2022 0624   HGBUR NEGATIVE 06/11/2022 0624   BILIRUBINUR NEGATIVE 06/11/2022 0624   KETONESUR NEGATIVE 06/11/2022 0624   PROTEINUR 30 (A) 06/11/2022 0624   UROBILINOGEN 1.0 07/16/2014 0244   NITRITE NEGATIVE 06/11/2022 0624   LEUKOCYTESUR NEGATIVE 06/11/2022 0624   Sepsis Labs:  Recent Results (from the past 240 hour(s))  Urine Culture     Status: None   Collection Time: 06/02/22  4:37 PM   Specimen: Urine, Catheterized  Result Value Ref Range Status   Specimen Description   Final    URINE, CATHETERIZED Performed at [facility], [address] [place], [place] [address]    Special Requests   Final  NONE Performed at [facility], [address], [place], [place] [address]    Culture   Final    NO GROWTH Performed at [facility], [place] [address], [place],  [address]    Report Status 06/03/2022 FINAL  Final     Scheduled Meds:  arformoterol  15 mcg Nebulization BID   aspirin  300 mg Rectal Daily   budesonide (PULMICORT) nebulizer solution  0.5 mg Nebulization BID   Chlorhexidine Gluconate Cloth  6 each Topical Q0600   [START ON 06/14/2022] citalopram  20 mg Oral Daily   [START ON 06/14/2022]  clopidogrel  75 mg Oral Daily   feeding supplement  237 mL Oral TID BM   insulin aspart  0-9 Units Subcutaneous Q4H   LORazepam  1 mg Intravenous Once   [START ON 06/14/2022] losartan  12.5 mg Oral Daily   metoprolol tartrate  5 mg Intravenous Q8H   mupirocin ointment   Nasal BID   pantoprazole (PROTONIX) IV  40 mg Intravenous Q12H   revefenacin  175 mcg Nebulization Daily   [START ON 06/14/2022] rosuvastatin  20 mg Oral QPM   sodium chloride flush  10-40 mL Intracatheter Q12H   [START ON 06/14/2022] spironolactone  12.5 mg Oral Daily   Continuous Infusions:  sodium chloride Stopped (06/06/22 2139)   dextrose 5 % and 0.45% NaCl      Procedures/Studies: DG Chest 1 View  Result Date: 06/11/2022 CLINICAL DATA:  Shortness of breath.  Code stroke. EXAM: CHEST  1 VIEW COMPARISON:  06/07/2022 FINDINGS: Lungs are hyperexpanded. Bullous changes noted right upper lobe, as before. Left PICC line tip overlies the distal SVC level. Interstitial markings are diffusely coarsened with chronic features. Interval development of streaky opacity at the right base, likely atelectatic with probable tiny right pleural effusion. Prominent skin folds overlie the lateral left lung. Telemetry leads overlie the chest. IMPRESSION: Interval development of streaky opacity at the right base, likely atelectatic with probable tiny right pleural effusion. Electronically Signed   By: Misty Stanley M.D.   On: 06/11/2022 06:36   MR BRAIN WO CONTRAST  Result Date: 06/10/2022 CLINICAL DATA:  Altered mental status stroke suspected EXAM: MRI HEAD WITHOUT CONTRAST MRA HEAD WITHOUT CONTRAST MRA NECK WITHOUT CONTRAST TECHNIQUE: Multiplanar, multi-echo pulse sequences of the brain and surrounding structures were acquired without intravenous contrast. Angiographic images of the Circle of Willis were acquired using MRA technique without intravenous contrast. Angiographic images of the neck were acquired using MRA technique without  intravenous contrast. Carotid stenosis measurements (when applicable) are obtained utilizing NASCET criteria, using the distal internal carotid diameter as the denominator. COMPARISON:  No prior MRI or MRA, correlation is made with CT head 06/10/2022 FINDINGS: Evaluation is limited by motion artifact. MRI HEAD FINDINGS Brain: No restricted diffusion to suggest acute or subacute infarct. Remaining sequences are severely motion limited, but no definite hemorrhage, mass, mass effect, or midline shift. No definite hydrocephalus or extra-axial collection. Vascular: Please see MRA findings below. Skull and upper cervical spine: Very motion limited. Sinuses/Orbits: No acute finding. Other: Fluid in the mastoid air cells bilaterally. MRA HEAD FINDINGS Evaluation is significantly limited by motion. Flow is grossly seen in the intracranial internal carotid arteries and proximal middle and anterior cerebral arteries. Flow is seen in the distal ACAs and mid to distal left and mid right MCA branches. Flow is grossly seen in the vertebral arteries, basilar artery and some of the posterior cerebral artery branches. MRA NECK FINDINGS Evaluation is somewhat  motion limited. Standard aortic branching without evidence aneurysm or dissection. There does not appear to be hemodynamically significant stenosis at the ICA bifurcations. The extracranial vertebral arteries appear patent throughout their course. IMPRESSION: 1. Evaluation of the brain is significantly limited by motion artifact. The diffusion-weighted sequence is less motion sensitive and, on this sequence, there is no evidence of acute or subacute infarct. 2. Evaluation of the intracranial vasculature is significantly limited by motion. Flow is grossly seen in the proximal intracranial vessels, but a detailed description of stenosis is not possible. 3. Evaluation of the neck vasculature is limited by motion. Within this limitation, no hemodynamically significant stenosis in the  neck. Electronically Signed   By: Merilyn Baba M.D.   On: 06/10/2022 18:38   MR ANGIO HEAD WO CONTRAST  Result Date: 06/10/2022 CLINICAL DATA:  Altered mental status stroke suspected EXAM: MRI HEAD WITHOUT CONTRAST MRA HEAD WITHOUT CONTRAST MRA NECK WITHOUT CONTRAST TECHNIQUE: Multiplanar, multi-echo pulse sequences of the brain and surrounding structures were acquired without intravenous contrast. Angiographic images of the Circle of Willis were acquired using MRA technique without intravenous contrast. Angiographic images of the neck were acquired using MRA technique without intravenous contrast. Carotid stenosis measurements (when applicable) are obtained utilizing NASCET criteria, using the distal internal carotid diameter as the denominator. COMPARISON:  No prior MRI or MRA, correlation is made with CT head 06/10/2022 FINDINGS: Evaluation is limited by motion artifact. MRI HEAD FINDINGS Brain: No restricted diffusion to suggest acute or subacute infarct. Remaining sequences are severely motion limited, but no definite hemorrhage, mass, mass effect, or midline shift. No definite hydrocephalus or extra-axial collection. Vascular: Please see MRA findings below. Skull and upper cervical spine: Very motion limited. Sinuses/Orbits: No acute finding. Other: Fluid in the mastoid air cells bilaterally. MRA HEAD FINDINGS Evaluation is significantly limited by motion. Flow is grossly seen in the intracranial internal carotid arteries and proximal middle and anterior cerebral arteries. Flow is seen in the distal ACAs and mid to distal left and mid right MCA branches. Flow is grossly seen in the vertebral arteries, basilar artery and some of the posterior cerebral artery branches. MRA NECK FINDINGS Evaluation is somewhat motion limited. Standard aortic branching without evidence aneurysm or dissection. There does not appear to be hemodynamically significant stenosis at the ICA bifurcations. The extracranial vertebral  arteries appear patent throughout their course. IMPRESSION: 1. Evaluation of the brain is significantly limited by motion artifact. The diffusion-weighted sequence is less motion sensitive and, on this sequence, there is no evidence of acute or subacute infarct. 2. Evaluation of the intracranial vasculature is significantly limited by motion. Flow is grossly seen in the proximal intracranial vessels, but a detailed description of stenosis is not possible. 3. Evaluation of the neck vasculature is limited by motion. Within this limitation, no hemodynamically significant stenosis in the neck. Electronically Signed   By: Merilyn Baba M.D.   On: 06/10/2022 18:38   MR ANGIO NECK WO CONTRAST  Result Date: 06/10/2022 CLINICAL DATA:  Altered mental status stroke suspected EXAM: MRI HEAD WITHOUT CONTRAST MRA HEAD WITHOUT CONTRAST MRA NECK WITHOUT CONTRAST TECHNIQUE: Multiplanar, multi-echo pulse sequences of the brain and surrounding structures were acquired without intravenous contrast. Angiographic images of the Circle of Willis were acquired using MRA technique without intravenous contrast. Angiographic images of the neck were acquired using MRA technique without intravenous contrast. Carotid stenosis measurements (when applicable) are obtained utilizing NASCET criteria, using the distal internal carotid diameter as the denominator. COMPARISON:  No prior MRI  or MRA, correlation is made with CT head 06/10/2022 FINDINGS: Evaluation is limited by motion artifact. MRI HEAD FINDINGS Brain: No restricted diffusion to suggest acute or subacute infarct. Remaining sequences are severely motion limited, but no definite hemorrhage, mass, mass effect, or midline shift. No definite hydrocephalus or extra-axial collection. Vascular: Please see MRA findings below. Skull and upper cervical spine: Very motion limited. Sinuses/Orbits: No acute finding. Other: Fluid in the mastoid air cells bilaterally. MRA HEAD FINDINGS Evaluation is  significantly limited by motion. Flow is grossly seen in the intracranial internal carotid arteries and proximal middle and anterior cerebral arteries. Flow is seen in the distal ACAs and mid to distal left and mid right MCA branches. Flow is grossly seen in the vertebral arteries, basilar artery and some of the posterior cerebral artery branches. MRA NECK FINDINGS Evaluation is somewhat motion limited. Standard aortic branching without evidence aneurysm or dissection. There does not appear to be hemodynamically significant stenosis at the ICA bifurcations. The extracranial vertebral arteries appear patent throughout their course. IMPRESSION: 1. Evaluation of the brain is significantly limited by motion artifact. The diffusion-weighted sequence is less motion sensitive and, on this sequence, there is no evidence of acute or subacute infarct. 2. Evaluation of the intracranial vasculature is significantly limited by motion. Flow is grossly seen in the proximal intracranial vessels, but a detailed description of stenosis is not possible. 3. Evaluation of the neck vasculature is limited by motion. Within this limitation, no hemodynamically significant stenosis in the neck. Electronically Signed   By: Merilyn Baba M.D.   On: 06/10/2022 18:38   EEG adult  Result Date: 06/10/2022 Lora Havens, MD     06/10/2022  5:15 PM Patient Name: CASS NOBLE MRN: [id number] Epilepsy Attending: Lora Havens Referring Physician/Provider: Rolla Plate, DO Date: 06/10/2022 Duration: 21.57 mins Patient history: 71yo F with seizure like activity. EEG to evaluate for seizure Level of alertness: lethargic AEDs during EEG study: Ativan Technical aspects: This EEG study was done with scalp electrodes positioned according to the 10-20 International system of electrode placement. Electrical activity was reviewed with band pass filter of 1-70Hz , sensitivity of 7 uV/mm, display speed of 59mm/sec with a 60Hz  notched filter  applied as appropriate. EEG data were recorded continuously and digitally stored.  Video monitoring was available and reviewed as appropriate. Description: No posterior dominant rhythm was seen. EEG showed continuous generalized polymorphic 3 to 6 Hz theta-delta slowing. Generalized periodic discharges with triphasic morphology at 1-1.5 Hz were also noted, more prominent when awake/stimulated. Hyperventilation and photic stimulation were not performed.   ABNORMALITY - Periodic discharges with triphasic morphology, generalized ( GPDs) - Continuous slow, generalized IMPRESSION: This study showed generalized periodic discharges with triphasic morphology which can be on the ictal-interictal continuum. However, the morphology, frequency and reactivity to stimulation is more commonly indicative of toxic-metabolic causes. Additionally there was  moderate diffuse encephalopathy, nonspecific etiology. No seizures were seen throughout the recording. [place]   CT HEAD WO CONTRAST (5MM)  Result Date: 06/10/2022 CLINICAL DATA:  Altered mental status EXAM: CT HEAD WITHOUT CONTRAST TECHNIQUE: Contiguous axial images were obtained from the base of the skull through the vertex without intravenous contrast. RADIATION DOSE REDUCTION: This exam was performed according to the departmental dose-optimization program which includes automated exposure control, adjustment of the mA and/or kV according to patient size and/or use of iterative reconstruction technique. COMPARISON:  None Available. FINDINGS: Brain: No evidence of hemorrhage, hydrocephalus, extra-axial collection or mass lesion/mass effect.  There is extensive subcortical hypodensity in the bilateral cerebral hemispheres. There are age-indeterminate infarcts in the right cerebellum (series 3, image 4), left temporal lobe (series 3, image 8-9), the corona radiata on the left (series 3, image 16) and possibly the left lentiform nucleus (series 13, image 30). Vascular:  No hyperdense vessel or unexpected calcification. Skull: Pneumatization of the right petrous apex. Sinuses/Orbits: Bilateral lens replacements. Assessment of the bilateral paranasal sinuses is limited due to the degree of motion artifact. Other: None. IMPRESSION: 1. There are multiple age-indeterminate infratentorial and bilateral supratentorial cortical hypodensities, worrisome for age indeterminate infarcts. Recommend further evaluation with a brain MRI. 2. There is extensive subcortical hypodensity, which is nonspecific, and may represent sequela of chronic microvascular ischemic change. This can also be assessed on follow up brian MRI. Electronically Signed   By: Marin Roberts M.D.   On: 06/10/2022 15:42   DG Chest Port 1 View  Result Date: 06/07/2022 CLINICAL DATA:  Acute respiratory failure with hypoxia and hypercarbia. EXAM: PORTABLE CHEST 1 VIEW COMPARISON:  06/03/2022. FINDINGS: The heart size and mediastinal contours are stable. There is atherosclerotic calcification of the aorta. Emphysematous changes are noted in the lungs. Coarse interstitial markings are present bilaterally and likely chronic. No consolidation, effusion, or pneumothorax. No acute osseous abnormality. IMPRESSION: 1. No active disease. 2. Emphysema. Electronically Signed   By: Brett Fairy M.D.   On: 06/07/2022 22:07   [place] EKG SITE RITE  Result Date: 06/04/2022 If Site Rite image not attached, placement could not be confirmed due to current cardiac rhythm.  [place] EKG SITE RITE  Result Date: 06/04/2022 If Site Rite image not attached, placement could not be confirmed due to current cardiac rhythm.  DG CHEST PORT 1 VIEW  Result Date: 06/03/2022 CLINICAL DATA:  NG tube placement.  Respiratory failure with hypoxia EXAM: PORTABLE CHEST 1 VIEW COMPARISON:  06/03/2022 FINDINGS: Endotracheal tube is 5.8 cm above the carina. NG tube enters the stomach. Heart is normal size. Increased markings in the lung bases likely reflects  scarring. No acute confluent opacities or effusions. Emphysematous changes. No acute bony abnormality. IMPRESSION: COPD. Bibasilar scarring. Support devices as above. Electronically Signed   By: Rolm Baptise M.D.   On: 06/03/2022 22:37   DG Chest Port 1 View  Result Date: 06/03/2022 CLINICAL DATA:  Acute respiratory failure with hypoxia EXAM: PORTABLE CHEST 1 VIEW COMPARISON:  06/01/2022 FINDINGS: Endotracheal and enteric tubes are again identified. Enteric tube has likely retracted some given location of side port. 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 Similar left greater than right interstitial changes. No significant pleural effusion. No pneumothorax. Stable cardiomediastinal contours. IMPRESSION: Similar left greater than right patchy opacities superimposed on emphysema. Enteric tube has likely retracted as side port is now visible just below the gastroesophageal junction. Electronically Signed   By: Macy Mis M.D.   On: 06/03/2022 08:19   DG CHEST PORT 1 VIEW  Result Date: 06/01/2022 CLINICAL DATA:  Pneumonia EXAM: PORTABLE CHEST 1 VIEW COMPARISON:  Previous studies including the examination of 05/31/2022 FINDINGS: Tip of endotracheal tube is 7 cm above the carina. Enteric tube is noted traversing the esophagus. Cardiac size is within normal limits. There is slight improvement in infiltrates in left lung. There is possible slight worsening of infiltrate in right parahilar region. Bullous emphysema is seen in right upper lung field limiting evaluation for small pneumothorax. There is no significant pleural effusion. No definite pneumothorax is seen. IMPRESSION: There is decrease in extensive interstitial infiltrates  in left lung. There is slight worsening of infiltrate in right parahilar region. Severe bullous emphysema in the right upper lung field. Electronically Signed   By: Elmer Picker M.D.   On: 06/01/2022 09:17   ECHOCARDIOGRAM LIMITED  Result Date: 05/31/2022     ECHOCARDIOGRAM LIMITED REPORT   Patient Name:   AKRITI REIMOLD Date of Exam: 05/31/2022 Medical Rec #:  [id number]         Height:       60.0 in Accession #:    [id number]        Weight:       90.2 lb Date of Birth:  09-05-1951         BSA:          1.330 m Patient Age:    84 years          BP:           125/55 mmHg Patient Gender: F                 HR:           101 bpm. Exam Location:  [facility] Procedure: Limited Echo Indications:    CHF-Acute Systolic [id number]  History:        Patient has prior history of Echocardiogram examinations, most                 recent 05/29/2022. Previous Myocardial Infarction and CAD, COPD                 and Stroke; Risk Factors:Hypertension and Dyslipidemia. Hx of                 CKD, COVID-10.  Sonographer:    Alvino Chapel RCS Referring Phys: [phone] DAVID TAT  Sonographer Comments: *Patient on mechanical ventilator at present. IMPRESSIONS  1. Left ventricular ejection fraction, by estimation, is 35 to 40%. The left ventricle has moderately decreased function. The left ventricle demonstrates regional wall motion abnormalities (see scoring diagram/findings for description). The mid septal and all apical LV segments are akinetic (consistent with prior TTE).  2. Right ventricular systolic function is mildly reduced. The right ventricular size is normal.  3. The mitral valve is degenerative. Moderate mitral annular calcification. Comparison(s): Compared to prior TTE on 05/29/22, there is no significant change. FINDINGS  Left Ventricle: The mid septal and all apical LV segments are akinetic. Left ventricular ejection fraction, by estimation, is 35 to 40%. The left ventricle has moderately decreased function. The left ventricle demonstrates regional wall motion abnormalities. The left ventricular internal cavity size was normal in size. There is no left ventricular hypertrophy. Right Ventricle: The right ventricular size is normal. Right ventricular systolic function is mildly reduced.  Pericardium: There is no evidence of pericardial effusion. Mitral Valve: The mitral valve is degenerative in appearance. There is mild thickening of the mitral valve leaflet(s). There is mild calcification of the mitral valve leaflet(s). Moderate mitral annular calcification. Aorta: The aortic root is normal in size and structure. LEFT VENTRICLE PLAX 2D LVIDd:         3.90 cm LVIDs:         3.30 cm LV PW:         0.90 cm LV IVS:        0.90 cm LVOT diam:     1.70 cm LVOT Area:     2.27 cm  LV Volumes (MOD) LV vol d, MOD A2C: 38.7 ml LV vol d, MOD A4C:  50.3 ml LV vol s, MOD A2C: 21.4 ml LV vol s, MOD A4C: 26.8 ml LV SV MOD A2C:     17.3 ml LV SV MOD A4C:     50.3 ml LV SV MOD BP:      20.7 ml LEFT ATRIUM         Index LA diam:    3.20 cm 2.41 cm/m   AORTA Ao Root diam: 2.50 cm  SHUNTS Systemic Diam: 1.70 cm Gwyndolyn Kaufman MD Electronically signed by Gwyndolyn Kaufman MD Signature Date/Time: 05/31/2022/1:31:58 PM    Final    DG Chest 1 View  Result Date: 05/31/2022 CLINICAL DATA:  Shortness of breath EXAM: CHEST  1 VIEW COMPARISON:  05/30/2022 FINDINGS: Endotracheal tube terminates 7 cm above the carina. Multifocal patchy left lung opacities, favoring pneumonia, possibly on the basis of aspiration. Right lung is essentially clear. No pleural effusion or pneumothorax. The heart is normal in size. Enteric tube courses into the stomach. Cholecystectomy clips. IMPRESSION: Endotracheal tube terminates 7 cm above the carina. Multifocal patchy left lung opacities, favoring pneumonia, possibly on the basis of aspiration. Electronically Signed   By: Julian Hy M.D.   On: 05/31/2022 03:54   DG CHEST PORT 1 VIEW  Result Date: 05/30/2022 CLINICAL DATA:  Endotracheal and orogastric tube. EXAM: PORTABLE CHEST 1 VIEW COMPARISON:  Chest CT 05/29/2022.  Chest x-ray 05/30/2009 3. FINDINGS: Endotracheal tube tip is 5 cm above the carina. Enteric tube tip is in the mid stomach. There are increasing central interstitial  and airspace opacities throughout the left lung. Severe emphysematous changes are again seen in the right upper lobe. There is no pleural effusion or pneumothorax. No acute fractures are seen. Cardiomediastinal silhouette is within normal limits. IMPRESSION: 1. Endotracheal tube tip 5 cm above carina. 2. Enteric tube tip at the level of the mid stomach. 3. Increasing left lung interstitial and airspace opacities which may represent edema or infection. 4. Stable severe emphysema. Electronically Signed   By: Ronney Asters M.D.   On: 05/30/2022 19:50   DG CHEST PORT 1 VIEW  Result Date: 05/30/2022 CLINICAL DATA:  Respiratory failure, pneumonia EXAM: PORTABLE CHEST 1 VIEW COMPARISON:  Previous studies including chest radiograph done on 05/28/2022 and CT done on 05/29/2022 FINDINGS: There is diffuse increase in patchy interstitial and alveolar densities in both lungs, more so on the left side. There is relative sparing of right upper lung field due to underlying bullous emphysema. There is blunting of both lateral CP angles. There is no pneumothorax. IMPRESSION: There is diffuse increase in interstitial and alveolar markings in both lungs, more so on the left side. Findings suggest asymmetric pulmonary edema or worsening multifocal bilateral pneumonia. Small bilateral pleural effusions. Electronically Signed   By: Elmer Picker M.D.   On: 05/30/2022 10:05   ECHOCARDIOGRAM COMPLETE  Result Date: 05/29/2022    ECHOCARDIOGRAM REPORT   Patient Name:   HADE DIMITRIOU Date of Exam: 05/29/2022 Medical Rec #:  [id number]         Height:       60.0 in Accession #:    [id number]        Weight:       88.2 lb Date of Birth:  10-14-1950         BSA:          1.318 m Patient Age:    30 years          BP:  143/66 mmHg Patient Gender: F                 HR:           113 bpm. Exam Location:  [facility] Procedure: 2D Echo, Cardiac Doppler and Color Doppler Indications:    Elevated Troponin  History:        Patient has  prior history of Echocardiogram examinations, most                 recent 04/14/2018. CAD and Previous Myocardial Infarction, COPD                 and Stroke; Risk Factors:Hypertension and Dyslipidemia. Hx of                 CKD, COVID-10.  Sonographer:    Alvino Chapel RCS Referring Phys: [phone] DAVID TAT  Sonographer Comments: ** Patient on BiPAP and moving Alot during echo. IMPRESSIONS  1. Left ventricular ejection fraction, by estimation, is 35 to 40%. The left ventricle has moderately decreased function. The left ventricle demonstrates regional wall motion abnormalities (see scoring diagram/findings for description). Left ventricular  diastolic parameters are indeterminate.  2. Right ventricular systolic function is mildly reduced. The right ventricular size is normal. There is moderately elevated pulmonary artery systolic pressure. The estimated right ventricular systolic pressure is 99991111 mmHg.  3. The mitral valve is degenerative. Trivial mitral valve regurgitation.  4. The aortic valve was not well visualized. Aortic valve regurgitation is not visualized. No aortic stenosis is present.  5. The inferior vena cava is dilated in size with <50% respiratory variability, suggesting right atrial pressure of 15 mmHg. Conclusion(s)/Recommendation(s): Normal systolic function at base with apical akinesis, differential includes LAD infarct vs stress induced cardiomyopathy. FINDINGS  Left Ventricle: Left ventricular ejection fraction, by estimation, is 35 to 40%. The left ventricle has moderately decreased function. The left ventricle demonstrates regional wall motion abnormalities. The left ventricular internal cavity size was normal in size. There is no left ventricular hypertrophy. Left ventricular diastolic parameters are indeterminate.  LV Wall Scoring: The mid and distal anterior septum, entire apex, and mid inferoseptal segment are akinetic. The anterior wall, antero-lateral wall, inferior wall, posterior wall,  basal anteroseptal segment, and basal inferoseptal segment are normal. Right Ventricle: The right ventricular size is normal. Right vetricular wall thickness was not well visualized. Right ventricular systolic function is mildly reduced. There is moderately elevated pulmonary artery systolic pressure. The tricuspid regurgitant velocity is 3.00 m/s, and with an assumed right atrial pressure of 15 mmHg, the estimated right ventricular systolic pressure is 99991111 mmHg. Left Atrium: Left atrial size was normal in size. Right Atrium: Right atrial size was normal in size. Pericardium: There is no evidence of pericardial effusion. Mitral Valve: The mitral valve is degenerative in appearance. Trivial mitral valve regurgitation. Tricuspid Valve: The tricuspid valve is normal in structure. Tricuspid valve regurgitation is trivial. Aortic Valve: The aortic valve was not well visualized. Aortic valve regurgitation is not visualized. No aortic stenosis is present. Pulmonic Valve: The pulmonic valve was not well visualized. Pulmonic valve regurgitation is not visualized. Aorta: The aortic root is normal in size and structure. Venous: The inferior vena cava is dilated in size with less than 50% respiratory variability, suggesting right atrial pressure of 15 mmHg. IAS/Shunts: The interatrial septum was not well visualized.  LEFT VENTRICLE PLAX 2D LVIDd:         3.60 cm LVIDs:  2.50 cm LV PW:         0.90 cm LV IVS:        0.90 cm LVOT diam:     1.70 cm LV SV:         37 LV SV Index:   28 LVOT Area:     2.27 cm  LV Volumes (MOD) LV vol d, MOD A2C: 51.2 ml LV vol d, MOD A4C: 57.0 ml LV vol s, MOD A2C: 30.1 ml LV vol s, MOD A4C: 32.9 ml LV SV MOD A2C:     21.1 ml LV SV MOD A4C:     57.0 ml LV SV MOD BP:      24.6 ml RIGHT VENTRICLE TAPSE (M-mode): 1.2 cm LEFT ATRIUM             Index        RIGHT ATRIUM           Index LA diam:        2.60 cm 1.97 cm/m   RA Area:     10.40 cm LA Vol (A2C):   32.4 ml 24.58 ml/m  RA Volume:    25.30 ml  19.20 ml/m LA Vol (A4C):   32.5 ml 24.66 ml/m LA Biplane Vol: 35.2 ml 26.71 ml/m  AORTIC VALVE LVOT Vmax:   88.30 cm/s LVOT Vmean:  55.300 cm/s LVOT VTI:    0.164 m  AORTA Ao Root diam: 2.50 cm MITRAL VALVE                TRICUSPID VALVE MV Area (PHT): 7.16 cm     TR Peak grad:   36.0 mmHg MV Decel Time: 106 msec     TR Vmax:        300.00 cm/s MV E velocity: 136.00 cm/s                             SHUNTS                             Systemic VTI:  0.16 m                             Systemic Diam: 1.70 cm Oswaldo Milian MD Electronically signed by Oswaldo Milian MD Signature Date/Time: 05/29/2022/2:52:39 PM    Final    CT Angio Chest Pulmonary Embolism (PE) W or WO Contrast  Result Date: 05/29/2022 CLINICAL DATA:  Shortness of breath x2 weeks EXAM: CT ANGIOGRAPHY CHEST WITH CONTRAST TECHNIQUE: Multidetector CT imaging of the chest was performed using the standard protocol during bolus administration of intravenous contrast. Multiplanar CT image reconstructions and MIPs were obtained to evaluate the vascular anatomy. RADIATION DOSE REDUCTION: This exam was performed according to the departmental dose-optimization program which includes automated exposure control, adjustment of the mA and/or kV according to patient size and/or use of iterative reconstruction technique. CONTRAST:  71mL OMNIPAQUE IOHEXOL 350 MG/ML SOLN COMPARISON:  Previous studies including the chest radiograph done on 05/28/2022 FINDINGS: Cardiovascular: There is homogeneous enhancement in thoracic aorta. There are no intraluminal filling defects in central pulmonary artery branches. There are scattered coronary artery calcifications. Mediastinum/Nodes: No significant lymphadenopathy seen. There are small pockets of air in right subclavian vein, possibly introduced during venipuncture. Lungs/Pleura: Centrilobular and panlobular emphysema is seen. 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 There is small patchy  infiltrate  in the lateral aspect of posterior segment of right upper lobe. There is thickening of interlobular septi in the lower lung fields. There are patchy infiltrates in right middle lobe and both lower lobes. Small right pleural effusion is seen. There is minimal left pleural effusion. There is no pneumothorax. Upper Abdomen: There is 5.1 cm smooth marginated fluid density lesion in the upper pole of left kidney suggesting possible cyst. No follow-up is recommended. There are a few low-density lesions in liver measuring up to 11 mm in size which are not fully characterized, most likely cysts. No follow-up imaging is recommended. Musculoskeletal: No acute findings are seen. Review of the MIP images confirms the above findings. IMPRESSION: There is no evidence of pulmonary artery embolism. There is no evidence of thoracic aortic dissection. Scattered coronary artery calcifications are seen. 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 There are small patchy infiltrates in posterior segment of right upper lobe, right middle lobe and both lower lobes suggesting multifocal atelectasis/pneumonia. Bilateral pleural effusions, more so on the right side. There are low-density lesions in left kidney and liver, possibly cysts. No follow-up imaging is recommended. Electronically Signed   By: Elmer Picker M.D.   On: 05/29/2022 13:04   DG Chest Portable 1 View  Result Date: 05/28/2022 CLINICAL DATA:  Shortness of breath for 2 weeks.  COVID positive. EXAM: PORTABLE CHEST 1 VIEW COMPARISON:  05/02/2022 FINDINGS: Lungs are hyperinflated. Significant bullous changes at the RIGHT lung apex, similar to prior. There are no focal consolidations or pleural effusions. No pulmonary edema. IMPRESSION: Hyperinflation of the lungs. No evidence for acute pulmonary abnormality. Electronically Signed   By: Nolon Nations M.D.   On: 05/28/2022 17:12    Paulyne Mooty Denton Brick, MD  [facility]  If 7PM-7AM, please  contact night-coverage [email or web address] Password [email or web address] 06/11/2022, 4:53 PM   LOS: 14 days

## 2022-06-11 NOTE — Progress Notes (Addendum)
Critical care note  RN called due to patient's presenting with seizure for 20 minutes, she states that Ativan 1 mg was already given, 20 minutes earlier.  She was asked to give an extra 1 mg of Ativan pending arrival to bedside. At bedside, patient had continuous movement of upper torso and the head, but movement does not appear to be seizures, it was more like seizure-like movements. She was lethargic and minimally withdraws to painful stimuli. Blood work showed procalcitonin of 0.1, CBC showed WBC 30.5, H/H 9.2/23, MCV 111.4, platelets 87.  BMP was normal except for hyperglycemia, albumin 2.3, ALT 70, phosphorus 2.4 Chest x-ray done showed intervaldevelopment of streaky opacity at the right base, likely atelectatic with probable tiny right pleural effusion Patient was moved to stepdown unit  BP 115/66 (BP Location: Left Arm)   Pulse (!) 121   Temp 98.6 F (37 C)   Resp 17   Ht 5' (1.524 m)   Wt 44.8 kg   SpO2 92%   BMI 19.29 kg/m    Physical Exam  Gen:-Lethargic and difficult to arouse HEENT:- [place].AT, No sclera icterus, PERRL Neck-Supple Neck,No JVD,.  Lungs-Tachypneic.  Coarse breath sounds worse in right lower lobe  CV-tachycardic.  S1, S2 normal Abd-  +ve B.Sounds, Abd Soft, No tenderness,    Extremity/Skin:- No  edema, warm and dry Psych-This cannot be assessed at this time due to patient's current condition Neuro-patient was lethargic but moving all extremities.  No new focal deficits  Assessment and plan Acute metabolic encephalopathy with seizure-like movements At bedside, patient's movement was not true szs EEG done yesterday showed no seizures per Dr. Karolee Stamps medical record Teleneurology was consulted and recommended continuous EEG  Acute respiratory failure with hypoxia r/o aspiration pneumonia Continue management per primary team  Leukocytosis possibly reactive, rule out infectious process Procalcitonin was only 0.21 Continue to monitor WBC with morning  labs  Thrombocytopenia Platelets 87, heparin was temporarily held due to platelets level being less than 100  Elevated MCV MCV 111.4.  Vitamin B12 and folate levels will be checked  Hyperglycemia CBG 334, hemoglobin A1c about a month ago was 5.9 (indicating prediabetes) This may be a reactive process, considering that CBG within last 4 days has been less than 120 Continue to monitor CBG and treat accordingly  Hypoalbuminemia possibly secondary to moderate protein calorie malnutrition Albumin 2.3, Continue Ensure.  Hypophosphatemia Phosphorus 2.4, continue management per primary team  Please refer to primary team's progress note for detailed management of this patient  Critical time: 71 minutes   Critical care personally provided  managing the patient due to high probability of clinically significant and life threatening deterioration. This critical care time included obtaining a history; examining the patient, pulse oximetry; ordering and review of studies; arranging urgent treatment with development of a management plan; evaluation of patient's response of treatment; frequent reassessment; and discussions with other providers.  This critical care time was performed to assess and manage the high probability of imminent and life threatening deterioration that could result in multi-organ failure.

## 2022-06-11 NOTE — Progress Notes (Signed)
PT Cancellation Note  Patient Details Name: [name] MRN: [id number] DOB: 02-04-1951   Cancelled Treatment:    Reason Eval/Treat Not Completed: Medical issues which prohibited therapy.  Patient transferred to a higher level of care and will need new PT consult to resume therapy when patient is medically stable.  Thank you.    7:59 AM, 06/11/22 Lonell Grandchild, MPT Physical Therapist with [facility] [address] [phone] office [phone] mobile phone

## 2022-06-11 NOTE — Progress Notes (Signed)
Report given to receiving nurse at [facility]. [facility] for transport to [facility] [facility].

## 2022-06-11 NOTE — Progress Notes (Addendum)
Palliative: Chart review completed.  [name] appeared to have another seizure-like episode last night and was transferred to stepdown unit.  She is resting quietly in bed, but does not interact with me in any meaningful way.  She is clearly unable to make her basic needs known.  Per bedside nursing staff she will be transferred to [facility] for 24-hour EEG.  At this point continue to treat the treatable, time for outcomes.  Unable to reach next of kin.  Conference with attending, bedside nursing staff, transition of care team related to patient condition, needs, goals of care.  Plan: At this point continue full scope/full code.  Transfer to [facility] for continuous EEG.  Time for outcomes.  Previously agreeable to short-term rehab.  25 minutes Quinn Axe, NP Palliative medicine team Team phone [phone] Greater than 50% of this time was spent counseling and coordinating care related to the above assessment and plan.

## 2022-06-11 NOTE — Consult Note (Signed)
[facility] TeleNeurology Consult Services  Stat Consult  [name], [name] Date of Birth:   October 24, 1950 Identification Number:   MRN - [id number] Date of Service:   06/11/2022 06:51:27  Diagnosis:       G93.49 - Encephalopathy Multifactorial  Impression 71 year old female admitted for management of respiratory failure presenting in consult for possible seizure. MRI brain and EEG already obtained on 06/10/2022 did not demonstrate any significant abnormalities. Would recommend continuous EEG if possible to evaluate for any subclinical seizure activity.   Recommendations: Our recommendations are outlined below.  Diagnostic Studies : Continuous EEG Monitoring  Nursing Recommendations : Delirium precautions: Blinds open during the day, closed at night, frequent reorientation, minimize nighttime interruptionsWhen possible avoid benzodiazepines, opioid pain medications, and anticholinergic medications  Consultations : Toxic metabolic work up per primary team  DVT Prophylaxis : Choice of Primary Team  Disposition : Neurology will follow  ----------------------------------------------------------------------------------------------------    Metrics: [facility] Notification Time: 06/11/2022 06:51:27 Stamp Time: 06/11/2022 06:51:27 Callback Response Time: 06/11/2022 06:52:57  Primary Provider Notified of Diagnostic Impression and Management Plan on: 06/11/2022 07:42:27    Imaging MRI brain, independently reviewed, motion degraded but does not demonstrate any acute abnormalities   ----------------------------------------------------------------------------------------------------  Chief Complaint: Seizure  History of Present Illness: Patient is a 71 year old Female. 71 year old female history of hypertension, stroke, CAD, psychosis initially presented on 05/28/2032 with dyspnea. She was admitted to ICU for management of respiratory  failure. Respiratory failure improved and she denies downgraded to hospital floor subsequently there was concern for possible seizure for which neurology was consulted on 06/10/2022. She was given lorazepam and inpatient neurology was consulted. MRI was obtained, independently reviewed, it was mostly graded by did not show any acute intracranial normalities. Routine EEG was obtained per report demonstrated triphasic waves and generalized slowing without epileptiform abnormalities. Patient another episode of generalized shaking on 06/11/2022 and was transferred back to ICU for management of this. At the time of neurology evaluation she was obtunded not able to follow commands    Past Medical History:      Hypertension      Coronary Artery Disease      Stroke  Medications:  No Anticoagulant use  Antiplatelet use: Yes Clopidogrel Reviewed EMR for current medications  Allergies:  Reviewed Description: Clopidogrel, iodine, lisinopril, penicillins  Social History: Unable To Obtain Due To Patient Status : Patient Is Confused  Family History:  Family History Cannot Be Obtained Because:Patient Is Confused  ROS : ROS Cannot Be Obtained Because:  Patient Is Confused  Past Surgical History: Past Surgical History Cannot Be Obtained Because: Patient Is Confused    Examination: BP(106/52), Pulse(107),  Neuro Exam:  General: Responds to painful stimulation  Speech: Did not speak  Language: Did not speak  Face: Symmetric:  Facial Sensation: Intact:  Visual Fields: Unable to assess  Extraocular Movements: Unable to assess  Motor Exam: Moves extremities antigravity  Sensation: Responds to pain  Spoke with : Dr. Cliffton Asters    Patient / Family was informed the Neurology Consult would occur via TeleHealth consult by way of interactive audio and video telecommunications and consented to receiving care in this manner.  Patient is being evaluated for possible acute neurologic impairment  and high probability of imminent or life - threatening deterioration.I spent total of 24 minutes providing care to this patient, including time for face to face visit via telemedicine, review of medical records, imaging studies and discussion of findings with providers, the patient and /  or family.   Dr Oliver Barre   [facility] For Inpatient follow-up with [facility] physician please call [facility] [phone]. This is not an outpatient service. Post hospital discharge, please contact hospital directly.

## 2022-06-12 ENCOUNTER — Inpatient Hospital Stay (HOSPITAL_COMMUNITY): Payer: Medicare Other

## 2022-06-12 DIAGNOSIS — J9601 Acute respiratory failure with hypoxia: Secondary | ICD-10-CM | POA: Diagnosis not present

## 2022-06-12 DIAGNOSIS — R778 Other specified abnormalities of plasma proteins: Secondary | ICD-10-CM | POA: Diagnosis not present

## 2022-06-12 DIAGNOSIS — I428 Other cardiomyopathies: Secondary | ICD-10-CM | POA: Diagnosis not present

## 2022-06-12 DIAGNOSIS — J441 Chronic obstructive pulmonary disease with (acute) exacerbation: Secondary | ICD-10-CM | POA: Diagnosis not present

## 2022-06-12 LAB — GLUCOSE, CAPILLARY
Glucose-Capillary: 126 mg/dL — ABNORMAL HIGH (ref 70–99)
Glucose-Capillary: 96 mg/dL (ref 70–99)
Glucose-Capillary: 118 mg/dL — ABNORMAL HIGH (ref 70–99)
Glucose-Capillary: 150 mg/dL — ABNORMAL HIGH (ref 70–99)
Glucose-Capillary: 106 mg/dL — ABNORMAL HIGH (ref 70–99)
Glucose-Capillary: 78 mg/dL (ref 70–99)
Glucose-Capillary: 88 mg/dL (ref 70–99)

## 2022-06-12 LAB — CBC WITH DIFFERENTIAL/PLATELET
Abs Immature Granulocytes: 0.08 10*3/uL — ABNORMAL HIGH (ref 0.00–0.07)
Basophils Absolute: 0 10*3/uL (ref 0.0–0.1)
Basophils Relative: 0 %
Eosinophils Absolute: 0 10*3/uL (ref 0.0–0.5)
Eosinophils Relative: 0 %
HCT: 37.1 % (ref 36.0–46.0)
Hemoglobin: 12.2 g/dL (ref 12.0–15.0)
Immature Granulocytes: 1 %
Lymphocytes Relative: 9 %
Lymphs Abs: 1.3 10*3/uL (ref 0.7–4.0)
MCH: 35.4 pg — ABNORMAL HIGH (ref 26.0–34.0)
MCHC: 32.9 g/dL (ref 30.0–36.0)
MCV: 107.5 fL — ABNORMAL HIGH (ref 80.0–100.0)
Monocytes Absolute: 1 10*3/uL (ref 0.1–1.0)
Monocytes Relative: 7 %
Neutro Abs: 12.7 10*3/uL — ABNORMAL HIGH (ref 1.7–7.7)
Neutrophils Relative %: 83 %
Platelets: 86 10*3/uL — ABNORMAL LOW (ref 150–400)
RBC: 3.45 MIL/uL — ABNORMAL LOW (ref 3.87–5.11)
RDW: 15.3 % (ref 11.5–15.5)
WBC: 15.2 10*3/uL — ABNORMAL HIGH (ref 4.0–10.5)
nRBC: 0 % (ref 0.0–0.2)

## 2022-06-12 LAB — COMPREHENSIVE METABOLIC PANEL
ALT: 69 U/L — ABNORMAL HIGH (ref 0–44)
AST: 38 U/L (ref 15–41)
Albumin: 2.6 g/dL — ABNORMAL LOW (ref 3.5–5.0)
Alkaline Phosphatase: 60 U/L (ref 38–126)
Anion gap: 11 (ref 5–15)
BUN: 9 mg/dL (ref 8–23)
CO2: 29 mmol/L (ref 22–32)
Calcium: 8.8 mg/dL — ABNORMAL LOW (ref 8.9–10.3)
Chloride: 100 mmol/L (ref 98–111)
Creatinine, Ser: 0.92 mg/dL (ref 0.44–1.00)
GFR, Estimated: >60 mL/min (ref >60)
Glucose, Bld: 113 mg/dL — ABNORMAL HIGH (ref 70–99)
Potassium: 3.9 mmol/L (ref 3.5–5.1)
Sodium: 140 mmol/L (ref 135–145)
Total Bilirubin: 1.2 mg/dL (ref 0.3–1.2)
Total Protein: 5.4 g/dL — ABNORMAL LOW (ref 6.5–8.1)

## 2022-06-12 LAB — VITAMIN B12: Vitamin B-12: 1258 pg/mL — ABNORMAL HIGH (ref 180–914)

## 2022-06-12 MED ORDER — ENOXAPARIN SODIUM 40 MG/0.4ML IJ SOSY: 40.0000 mg | PREFILLED_SYRINGE | INTRAMUSCULAR | Status: DC

## 2022-06-12 MED ORDER — ENOXAPARIN SODIUM 30 MG/0.3ML IJ SOSY: 30.0000 mg | PREFILLED_SYRINGE | INTRAMUSCULAR | Status: DC

## 2022-06-12 NOTE — Progress Notes (Addendum)
Subjective: No acute events overnight.  Son [name] at bedside states she appears more alert and awake compared to previous days.  No new concerns.  ROS: Unable to obtain due to poor mental status  Examination  Vital signs in last 24 hours: Temp:  [97.8 F (36.6 C)-99.7 F (37.6 C)] 97.8 F (36.6 C) (09/22 1158) Pulse Rate:  [58-123] 97 (09/22 0439) Resp:  [17-34] 20 (09/22 1158) BP: (101-183)/(37-87) 152/70 (09/22 1158) SpO2:  [98 %-100 %] 98 % (09/22 1158)  General: lying in bed, NAD Neuro: Was asleep but did wake up to verbal stimulation, able to tell me her name but not oriented to place or person (not recognize her son), followed simple one-step commands like sticking out her tongue but was not able to name objects, PERRLA, blinks to threat bilaterally, no apparent facial asymmetry, spontaneously moving all 4 extremities in bed.  Basic Metabolic Panel: Recent Labs  Lab 06/07/22 0603 06/08/22 0921 06/09/22 0406 06/10/22 0414 06/11/22 0624 06/12/22 0243  NA 146* 145 148* 148* 136 140  K 3.3* 3.1* 3.2* 3.4* 4.3 3.9  CL 112* 115* 112* 108 99 100  CO2 26 21* 30 30 31 29   GLUCOSE 97 111* 98 121* 334* 113*  BUN 22 16 17 17 10 9   CREATININE 0.82 0.71 0.79 1.12* 0.88 0.92  CALCIUM 8.8* 8.3* 9.0 9.1 7.7* 8.8*  MG 2.0  --  1.7 2.0  --   --   PHOS  --   --   --   --  2.4*  --     CBC: Recent Labs  Lab 06/08/22 0618 06/09/22 0406 06/10/22 0414 06/11/22 0624 06/12/22 0243  WBC 12.9* 14.5* 20.5* 13.5* 15.2*  NEUTROABS  --   --   --  11.1* 12.7*  HGB 11.5* 11.3* 11.4* 9.2* 12.2  HCT 36.7 35.4* 37.7 29.3* 37.1  MCV 112.6* 109.9* 117.4* 111.4* 107.5*  PLT 152 143* 127* 87* 86*     Coagulation Studies: No results for input(s): "LABPROT", "INR" in the last 72 hours.  Imaging No new brain imaging overnight  ASSESSMENT AND PLAN: 71yo F with ams and seizure [facility] activity [facility]   Seizure like activity Acute encephalopathy - Semiology of the first  episodes wasn't typical for epileptic seizure. No documented h/o of prior seizures and son at bedside also denies previous history of seizures -Encephalopathy most likely due to delirium, medication use in an elderly female with minimal cognitive reserves   Recommendations: -Continue EEG overnight to look for intermittent seizures and characterization of spells - Hold off on Aeds unless eeg shows epileptogenicity. If needed will start Depakote or oxcarb as they have mood stabilizing effects  - avoid sedating meds -Continue seizure precautions, delirium precautions -Off note, MRI brain was significantly limited due to motion artifact.  Patient is already on aspirin and plavix therefore will hold off on sedating patient for MRI.  If needed, MRI brain can be repeated as an outpatient -Discussed plan with son at bedside as well as Dr. Sloan Leiter  I have spent a total of   40 minutes with the patient reviewing hospital notes,  test results, labs and examining the patient as well as establishing an assessment and plan that was discussed personally with the patient's son [name].  > 50% of time was spent in direct patient care.    [name] Epilepsy [facility] For questions after 5pm please refer to AMION to reach the Neurologist on call

## 2022-06-12 NOTE — Consult Note (Signed)
   [facility] Inpatient Consult   06/12/2022  [name] Jan 05, 1951 [id number]  [place] [facility] [ACO] Patient: [employer]  Primary Care Provider:  Carrolyn Meiers, MD  Reviewed for length of stay and less than 30 days readmission for high risk unplanned readmission 1415 pm Patient resting comfortably resp even and non labored on rounds, nursing staff at bedside, no family at bedside.  Patient screened for hospitalization with noted extreme high risk score for unplanned readmission risk and to assess for potential [place] Management service needs for post hospital transition.  Review of patient's medical record reveals patient is likely for a skilled nursing facility level of care. Will follow.   Plan:  Continue to follow progress and disposition to assess for post hospital care management needs.    For questions contact:   [name], RN BSN [place] [facility] Liaison  [phone] business mobile phone Toll free office [phone]  Fax number: [phone] [email or web address]

## 2022-06-12 NOTE — Progress Notes (Signed)
LTM EEG hooked up and running - no initial skin breakdown - push button tested - neuro notified. [facility] monitoring.  

## 2022-06-12 NOTE — Progress Notes (Signed)
Nutrition Follow-up  DOCUMENTATION CODES:  Severe malnutrition in context of chronic illness  INTERVENTION:  Advance diet as able per SLP, NPO recommended at this time due to mental status When able, resume Ensure Enlive po BID, each supplement provides 350 kcal and 20 grams of protein. Would recommend placement of NG tube and initiation of TF as pt is severely malnourished and has been with without adequate nutrition for most of admission  Recommend the following: Osmolite 1.2 at 55 ml/h would provide 1584 kcal, 73 gm protein, 1082 ml free water daily to meet 100% of nutrition needs.   NUTRITION DIAGNOSIS:  Severe Malnutrition related to chronic illness as evidenced by severe muscle depletion, severe fat depletion, percent weight loss. - ongoing  GOAL:   Patient will meet greater than or equal to 90% of their needs - no progressing, NPO  MONITOR:   Diet advancement, Weight trends, I & O's  REASON FOR ASSESSMENT:   Consult Enteral/tube feeding initiation and management  ASSESSMENT:   Patient is a 71 yo female with hx of CVA, blindness in right eye, GERD, CKD-3a, CAD, HLD, HTN. Recent COVID, AKI, colitis. Presents with shortness of breath and required BiPAP. COPD with acute exacerbation.  9/9 intubated 9/14 extubated  Pt sleeping at the time of visit, undergoing EEG. Pt did not open her eyes despite having name called and being touched.   Upon physical assessment, pt with severe muscle and fat deficits. PO intake has been lacking this admission as she has refused the majority of her meals and supplements. Initially, MD asked that small bore tube be placed in IR but could not be scheduled until Monday. SLP evaluated today and cleared pt to take medicines orally in puree when awake and alert but did not recommend a diet. Tube placement now on hold at this time.   Would benefit from tube placement in order to provide nutrition. Urine amber colored on exam.  Nutritionally  Relevant Medications: Scheduled Meds:  feeding supplement  237 mL Oral TID BM   insulin aspart  0-9 Units Subcutaneous Q4H   pantoprazole (PROTONIX) IV  40 mg Intravenous Q12H   Continuous Infusions:  dextrose 5 % and 0.45% NaCl 100 mL/hr at 06/11/22 1852   Labs Reviewed  NUTRITION - FOCUSED PHYSICAL EXAM: Flowsheet Row Most Recent Value  Orbital Region Mild depletion  Upper Arm Region Severe depletion  Thoracic and Lumbar Region Moderate depletion  Buccal Region Mild depletion  Temple Region Mild depletion  Clavicle Bone Region Moderate depletion  Clavicle and Acromion Bone Region Severe depletion  Scapular Bone Region Unable to assess  Dorsal Hand Mild depletion  Patellar Region Severe depletion  Anterior Thigh Region Severe depletion  Posterior Calf Region Severe depletion  Edema (RD Assessment) None  Hair Reviewed  Eyes Reviewed  Mouth Reviewed  [no top teeth present]  Skin Reviewed  Nails Reviewed    Diet Order:   Diet Order             Diet NPO time specified Except for: Other (See Comments)  Diet effective now                   EDUCATION NEEDS:  Not appropriate for education at this time  Skin:  Skin Assessment: Skin Integrity Issues: Skin Integrity Issues:: Stage II Stage II: sacrum  Last BM:  9/16 type 6  Height:   Ht Readings from Last 1 Encounters:  05/31/22 5' (1.524 m)    Weight:   Wt  Readings from Last 1 Encounters:  06/10/22 44.8 kg    Ideal Body Weight:  45.5 kg  BMI:  Body mass index is 19.29 kg/m.  Estimated Nutritional Needs:  Kcal:  1500-1700 kcal/d Protein:  75-90 g/d Fluid:  >/=1.5L/d    Ranell Patrick, RD, LDN Clinical Dietitian RD pager # available in [facility]  After hours/weekend pager # available in [facility]

## 2022-06-12 NOTE — Procedures (Signed)
Patient Name: [name]  MRN: [id number]  Epilepsy Attending: Lora Havens  Referring Physician/Provider: Amie Portland, MD  Duration: 06/12/2022 0448 to 06/13/2022 0448   Patient history: 71yo F with seizure like activity. EEG to evaluate for seizure   Level of alertness: lethargic , asleep   AEDs during EEG study: None   Technical aspects: This EEG study was done with scalp electrodes positioned according to the 10-20 International system of electrode placement. Electrical activity was reviewed with band pass filter of 1-70Hz , sensitivity of 7 uV/mm, display speed of 70mm/sec with a 60Hz  notched filter applied as appropriate. EEG data were recorded continuously and digitally stored.  Video monitoring was available and reviewed as appropriate.   Description: No posterior dominant rhythm was seen. Sleep was characterized by vertex waves, sleep spindles (12 to 14hz ), maximal frontocentral region. EEG showed continuous generalized polymorphic 3 to 6 Hz theta-delta slowing. Generalized periodic discharges with triphasic morphology at 1-1.5 Hz were also noted, more prominent when awake/stimulated. Hyperventilation and photic stimulation were not performed.      ABNORMALITY - Periodic discharges with triphasic morphology, generalized ( GPDs) - Continuous slow, generalized   IMPRESSION: This study showed generalized periodic discharges with triphasic morphology which can be on the ictal-interictal continuum. However, the morphology, frequency and reactivity to stimulation is more commonly indicative of toxic-metabolic causes. Additionally there was  moderate diffuse encephalopathy, nonspecific etiology. No seizures were seen throughout the recording.   Azeem Poorman Barbra Sarks

## 2022-06-12 NOTE — Evaluation (Signed)
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 Colonic diverticulosis. Inadequate prepartation precluded examination of all the rectal and colonic.  I suspect slow colonic transit in the setting of a markely redundant colon.   CORONARY ANGIOPLASTY WITH STENT PLACEMENT  [id number]   cyst removed from left wrist     ERCP N/A 10/10/2021   Procedure: ENDOSCOPIC RETROGRADE CHOLANGIOPANCREATOGRAPHY (ERCP);  Surgeon: Rogene Houston, MD;  Location: [facility] ORS;  Service: Gastroenterology;  Laterality: N/A;   ERCP N/A 10/13/2021   Procedure: ENDOSCOPIC RETROGRADE CHOLANGIOPANCREATOGRAPHY (ERCP);  Surgeon: Rogene Houston, MD;  Location: [facility] ORS;  Service: Gastroenterology;  Laterality: N/A;   ESOPHAGEAL DILATION  05/08/2022   Procedure: ESOPHAGEAL DILATION;  Surgeon: Montez Morita, Quillian Quince, MD;  Location: [facility] ENDO  SUITE;  Service: Gastroenterology;;  savory dilation'   ESOPHAGOGASTRODUODENOSCOPY  09/05/2004   Dr Chucky May gastritis, candida esophagitis   ESOPHAGOGASTRODUODENOSCOPY  09/07/2011   [id number], esophageal in the distal esophagus/Mild gastritis   ESOPHAGOGASTRODUODENOSCOPY (EGD) WITH PROPOFOL N/A 05/08/2022   Procedure: ESOPHAGOGASTRODUODENOSCOPY (EGD) WITH PROPOFOL;  Surgeon: Harvel Quale, MD;  Location: [facility];  Service: Gastroenterology;  Laterality: N/A;  with possible esophageal dilation   Lumps removed from each breast     SPHINCTEROTOMY N/A 10/10/2021   Procedure: SPHINCTEROTOMY INCOMPLETE, STONE NOT REMOVED;  Surgeon: Rogene Houston, MD;  Location: [facility] ORS;  Service: Gastroenterology;  Laterality: N/A;   HPI:  Pt is [name] on 05/28/22 with SOB secondary to COPD exacerbation. Pt was initially placed on BiPAP, however she continued to worsen and required intubation. ETT 9/9-9/14. No [facility] documented. Hospital course complicated by new onset HFrEF, acute metabolic encephalopathy and seizures. Pt transferred to [facility] on 9/22 for further workup. MRI 9/20 negative for acute changes. EEG 9/22: negative for siezures, generalized periodic discharges with triphasic morphology thought to be indicative of toxic-metabolic causes. Moderate diffuse encephalopathy. 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 Pt followed for swallowing 05/04/22-05/05/22 with recommendation for dysphagia 2 solids with thin liquids, and GI consult due to question of esphageal component. EGD 8/18: Two benign-appearing, intrinsic mild stenoses were found in the distal esophagus and dilation performed; advance diet as tolerated.    Assessment /  Plan / Recommendation  Clinical Impression  Pt was seen  for bedside swallow evaluation. She was lethargic throughout the evaluation and this likely impacted her performance. She responded to questions, but exhibited difficulty maintaining alertness for prolonged periods. Pt was unable to participate in a complete oral mechanism exam. Oral inspection revealed limited dentition (mandibular anterior teeth only) and she denied use of dentures. Pt exhibited reduced bolus awareness and required intermittent cues for bolus manipulation and swallowing of purees and thin liquids due to oral holding. Pt was occasionally resistant to accepting boluses, but responded to verbal prompts. Despite her reduced alertness, no s/s of aspiration were noted with any solids or liquids. It is recommended that the pt's NPO status be maintained, but if pt is alert, meds may be crushed and given in puree and pt may have floor-stock puree and small sips of water. SLP will follow pt to assess improvement in swallow function. SLP Visit Diagnosis: Dysphagia, unspecified (R13.10)    Aspiration Risk  Mild aspiration risk    Diet Recommendation NPO (pt may have floor-stock purees and small sips of water if alert)   Medication Administration: Crushed with puree Compensations: Slow rate;Small sips/bites Postural Changes: Seated upright at 90 degrees    Other  Recommendations Oral Care Recommendations: Oral care QID;Oral care prior to ice chip/H20    Recommendations for follow up therapy are one component of a multi-disciplinary discharge planning process, led by the attending physician.  Recommendations may be updated based on patient status, additional functional criteria and insurance authorization.  Follow up Recommendations  (TBD)      Assistance Recommended at Discharge Frequent or constant Supervision/Assistance  Functional Status Assessment Patient has had a recent decline in their functional status and demonstrates the ability to make significant improvements in function in a  reasonable and predictable amount of time.  Frequency and Duration min 2x/week  2 weeks       Prognosis Prognosis for Safe Diet Advancement: Good Barriers to Reach Goals: Cognitive deficits      Swallow Study   General Date of Onset: 06/11/22 HPI: Pt is [name] on 05/28/22 with SOB secondary to COPD exacerbation. Pt was initially placed on BiPAP, however she continued to worsen and required intubation. ETT 9/9-9/14. No [place] documented. Hospital course complicated by new onset HFrEF, acute metabolic encephalopathy and seizures. Pt transferred to [facility] on 9/22 for further workup. MRI 9/20 negative for acute changes. EEG 9/22: negative for siezures, generalized periodic discharges with triphasic morphology thought to be indicative of toxic-metabolic causes. Moderate diffuse encephalopathy. 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 Pt followed for swallowing 05/04/22-05/05/22 with recommendation for dysphagia 2 solids with thin liquids, and GI consult due to question of esphageal component. EGD 8/18: Two benign-appearing, intrinsic mild stenoses were found in the distal esophagus and dilation performed; advance diet as tolerated. Type of Study: Bedside Swallow Evaluation Previous Swallow Assessment: See HPI Diet Prior to this Study: NPO Temperature Spikes Noted: No Respiratory Status: Nasal cannula History of Recent Intubation: Yes Length of Intubations (days): 5 days Date extubated: 06/09/22 Behavior/Cognition: Lethargic/Drowsy;Confused;Requires cueing;Doesn't follow directions Oral Cavity Assessment: Dry Oral Care Completed by SLP: Yes Oral Cavity - Dentition: Missing dentition Self-Feeding Abilities: Total assist Patient Positioning: Upright in bed Baseline Vocal Quality:  Normal Volitional Cough: Cognitively unable to elicit Volitional Swallow: Unable  to elicit    Oral/Motor/Sensory Function Overall Oral Motor/Sensory Function:  (UTA)   Ice Chips Ice chips: Impaired Presentation: Spoon Oral Phase Impairments: Poor awareness of bolus;Impaired mastication;Reduced lingual movement/coordination Oral Phase Functional Implications: Oral holding;Prolonged oral transit   Thin Liquid Thin Liquid: Impaired Presentation: Straw Oral Phase Functional Implications: Oral holding;Prolonged oral transit    Nectar Thick Nectar Thick Liquid: Not tested   Honey Thick Honey Thick Liquid: Not tested   Puree Puree: Impaired Presentation: Spoon Oral Phase Impairments: Poor awareness of bolus Oral Phase Functional Implications: Oral holding;Prolonged oral transit;Oral residue   Solid     Solid: Not tested     Reyah Streeter I. Hardin Negus, [place], [place] Office number [phone]  Horton Marshall 06/12/2022,12:05 PM

## 2022-06-12 NOTE — Plan of Care (Signed)
  Problem: Coping: Goal: Psychosocial and spiritual needs will be supported Outcome: Progressing   Problem: Respiratory: Goal: Will maintain a patent airway Outcome: Progressing Goal: Complications related to the disease process, condition or treatment will be avoided or minimized Outcome: Progressing   Problem: Clinical Measurements: Goal: Ability to maintain clinical measurements within normal limits will improve Outcome: Progressing Goal: Will remain free from infection Outcome: Progressing Goal: Diagnostic test results will improve Outcome: Progressing Goal: Respiratory complications will improve Outcome: Progressing Goal: Cardiovascular complication will be avoided Outcome: Progressing   Problem: Coping: Goal: Level of anxiety will decrease Outcome: Progressing   Problem: Elimination: Goal: Will not experience complications related to bowel motility Outcome: Progressing Goal: Will not experience complications related to urinary retention Outcome: Progressing   Problem: Pain Managment: Goal: General experience of comfort will improve Outcome: Progressing   Problem: Safety: Goal: Ability to remain free from injury will improve Outcome: Progressing   Problem: Skin Integrity: Goal: Risk for impaired skin integrity will decrease Outcome: Progressing   Problem: Respiratory: Goal: Ability to maintain a clear airway and adequate ventilation will improve Outcome: Progressing   Problem: Education: Goal: Individualized Educational Video(s) Outcome: Progressing   Problem: Fluid Volume: Goal: Ability to maintain a balanced intake and output will improve Outcome: Progressing   Problem: Metabolic: Goal: Ability to maintain appropriate glucose levels will improve Outcome: Progressing   Problem: Nutritional: Goal: Progress toward achieving an optimal weight will improve Outcome: Progressing   Problem: Skin Integrity: Goal: Risk for impaired skin integrity will  decrease Outcome: Progressing   Problem: Tissue Perfusion: Goal: Adequacy of tissue perfusion will improve Outcome: Progressing   Problem: Clinical Measurements: Goal: Diagnostic test results will improve Outcome: Progressing   Problem: Education: Goal: Knowledge of risk factors and measures for prevention of condition will improve Outcome: Not Progressing   Problem: Education: Goal: Knowledge of General Education information will improve Description: Including pain rating scale, medication(s)/side effects and non-pharmacologic comfort measures Outcome: Not Progressing   Problem: Health Behavior/Discharge Planning: Goal: Ability to manage health-related needs will improve Outcome: Not Progressing   Problem: Activity: Goal: Risk for activity intolerance will decrease Outcome: Not Progressing   Problem: Nutrition: Goal: Adequate nutrition will be maintained Outcome: Not Progressing   Problem: Activity: Goal: Ability to tolerate increased activity will improve Outcome: Not Progressing   Problem: Role Relationship: Goal: Method of communication will improve Outcome: Not Progressing   Problem: Education: Goal: Ability to describe self-care measures that may prevent or decrease complications (Diabetes Survival Skills Education) will improve Outcome: Not Progressing   Problem: Coping: Goal: Ability to adjust to condition or change in health will improve Outcome: Not Progressing   Problem: Health Behavior/Discharge Planning: Goal: Ability to identify and utilize available resources and services will improve Outcome: Not Progressing Goal: Ability to manage health-related needs will improve Outcome: Not Progressing   Problem: Nutritional: Goal: Maintenance of adequate nutrition will improve Outcome: Not Progressing   Problem: Safety: Goal: Non-violent Restraint(s) Outcome: Not Applicable

## 2022-06-12 NOTE — Evaluation (Signed)
Physical Therapy Re-Evaluation  Patient Details Name: [name] MRN: [id number] DOB: 12-Nov-1950 Today's Date: 06/12/2022  History of Present Illness  Pt is a 71 y/o female [facility]ing [facility] with SOB due to COPD exacerbation. Placed on bipap initally, progressing to intubation with ETT 9/9-9/14. Course complicated by new onset HFrEF, acute metabolic encephalopathy and seizures.  Transferred to [facility] on 9/22.  MRI negative, EEG 9/22 negative for seizures, moderate diffuse encephalopathy. PMH includes: HTN, CVA, CAD, blind R eye, depression, psychosis.   Clinical Impression  Pt admitted with above diagnosis. Pt currently with functional limitations due to the deficits listed below (see PT Problem List). At the time of PT eval pt was able to perform transfers to/from EOB with up to +2 total assist. Based on current presentation, recommending SNF level rehab at d/c to maximize functional independence and safety prior to return home with family support. Acutely, pt will benefit from skilled PT to increase their independence and safety with mobility to allow discharge to the venue listed below.          Recommendations for follow up therapy are one component of a multi-disciplinary discharge planning process, led by the attending physician.  Recommendations may be updated based on patient status, additional functional criteria and insurance authorization.  Follow Up Recommendations Skilled nursing-short term rehab (<3 hours/day) Can patient physically be transported by private vehicle: No    Assistance Recommended at Discharge Intermittent Supervision/Assistance  Patient can return home with the following  Help with stairs or ramp for entrance;Assistance with cooking/housework;Two people to help with walking and/or transfers;Two people to help with bathing/dressing/bathroom;Direct supervision/assist for medications management;Direct supervision/assist for financial management;Assist for  transportation    Equipment Recommendations None recommended by PT  Recommendations for Other Services       Functional Status Assessment Patient has had a recent decline in their functional status and demonstrates the ability to make significant improvements in function in a reasonable and predictable amount of time.     Precautions / Restrictions Precautions Precautions: Fall Precaution Comments: long term EEG Restrictions Weight Bearing Restrictions: No      Mobility  Bed Mobility Overal bed mobility: Needs Assistance Bed Mobility: Supine to Sit, Sit to Sidelying, Rolling Rolling: Total assist, +2 for physical assistance, +2 for safety/equipment   Supine to sit: Total assist, +2 for physical assistance, +2 for safety/equipment   Sit to sidelying: Total assist, +2 for physical assistance, +2 for safety/equipment General bed mobility comments: pt turned to R side of bed, total assist +2  to transition to EOB and return to supine    Transfers                   General transfer comment: deferred    Ambulation/Gait                  Stairs            Wheelchair Mobility    Modified Rankin (Stroke Patients Only)       Balance Overall balance assessment: Needs assistance Sitting-balance support: Feet supported, No upper extremity supported Sitting balance-Leahy Scale: Poor Sitting balance - Comments: no righting reactions, heavy posterior lean when support removed.  to maintain upright posture requires max-total assist                                     Pertinent Vitals/Pain  Pain Assessment Pain Assessment: Faces Faces Pain Scale: Hurts little more Pain Location: buttocks (son reports wound) Pain Descriptors / Indicators: Discomfort, Grimacing Pain Intervention(s): Limited activity within patient's tolerance, Monitored during session, Repositioned    Home Living Family/patient expects to be discharged to:: Private  residence Living Arrangements: Alone Available Help at Discharge: Family;Available PRN/intermittently Type of Home: House Home Access: Stairs to enter Entrance Stairs-Rails: Right;Left;Can reach both Entrance Stairs-Number of Steps: 6   Home Layout: One level Home Equipment: Conservation officer, nature (2 wheels);Cane - single point;BSC/3in1;Shower seat Additional Comments: son reports has RW, unsure of other DME- above is from [facility] eval    Prior Function Prior Level of Function : Independent/Modified Independent;Driving             Mobility Comments: short distance and household ambulation without at ADLs Comments: Independent     Hand Dominance        Extremity/Trunk Assessment   Upper Extremity Assessment Upper Extremity Assessment: Defer to OT evaluation RUE Deficits / Details: squeezing hand to command x 1, resistive to ROM with elbow held in flexion. Limited assessment due to cognition. LUE Deficits / Details: does not squeeze hand on command, resistive to ROM with elbow held in flexion. Limited assessment due to cognition.    Lower Extremity Assessment Lower Extremity Assessment: Generalized weakness (Pt not following any commands to attempt meaningful)    Cervical / Trunk Assessment Cervical / Trunk Assessment: Other exceptions Cervical / Trunk Exceptions: Forward head posture with rounded shoulders  Communication   Communication: Receptive difficulties  Cognition Arousal/Alertness: Lethargic Behavior During Therapy: Flat affect Overall Cognitive Status: Difficult to assess Area of Impairment: Following commands, Awareness                       Following Commands: Follows one step commands inconsistently   Awareness: Intellectual   General Comments: pt repsonds to her name and corrects therapist on how to pronouce.  Otherwise able to squeeze R hand 1x to command, not following further commands. Voices "no" when asked pt to open her eyes, as session progressed  pt with increased lethargy and decreased interactions.        General Comments General comments (skin integrity, edema, etc.): son present and supportive, O2 on 2L upon exit RA at EOB maintained SPO2 but supine SpO2 rnaged from 87-89% on RA    Exercises     Assessment/Plan    PT Assessment Patient needs continued PT services  PT Problem List Decreased strength;Decreased activity tolerance;Decreased balance;Decreased mobility       PT Treatment Interventions DME instruction;Gait training;Stair training;Functional mobility training;Therapeutic activities;Therapeutic exercise;Balance training;Patient/family education    PT Goals (Current goals can be found in the Care Plan section)  Acute Rehab PT Goals Patient Stated Goal: return home after rehab PT Goal Formulation: With patient Time For Goal Achievement: 07/06/22 Potential to Achieve Goals: Good    Frequency Min 3X/week     Co-evaluation PT/OT/SLP Co-Evaluation/Treatment: Yes Reason for Co-Treatment: Complexity of the patient's impairments (multi-system involvement);Necessary to address cognition/behavior during functional activity;For patient/therapist safety;To address functional/ADL transfers PT goals addressed during session: Mobility/safety with mobility;Balance;Strengthening/ROM OT goals addressed during session: ADL's and self-care       AM-PAC PT "6 Clicks" Mobility  Outcome Measure Help needed turning from your back to your side while in a flat bed without using bedrails?: Total Help needed moving from lying on your back to sitting on the side of a flat bed without using bedrails?: Total  Help needed moving to and from a bed to a chair (including a wheelchair)?: Total Help needed standing up from a chair using your arms (e.g., wheelchair or bedside chair)?: Total Help needed to walk in hospital room?: Total Help needed climbing 3-5 steps with a railing? : Total 6 Click Score: 6    End of Session Equipment  Utilized During Treatment: Gait belt Activity Tolerance: Patient tolerated treatment well;Patient limited by fatigue Patient left: in chair;with call bell/phone within reach;with chair alarm set Nurse Communication: Mobility status PT Visit Diagnosis: Unsteadiness on feet (R26.81);Other abnormalities of gait and mobility (R26.89);Muscle weakness (generalized) (M62.81)    Time: [id number] PT Time Calculation (min) (ACUTE ONLY): 24 min   Charges:   PT Evaluation $PT Re-evaluation: 1 Re-eval          Rolinda Roan, PT, DPT Acute Rehabilitation Services Secure Chat Preferred Office: [phone]   Thelma Comp 06/12/2022, 2:45 PM

## 2022-06-12 NOTE — Plan of Care (Signed)
  Problem: Education: Goal: Knowledge of risk factors and measures for prevention of condition will improve Outcome: Not Progressing   Problem: Coping: Goal: Psychosocial and spiritual needs will be supported Outcome: Not Progressing   Problem: Respiratory: Goal: Will maintain a patent airway Outcome: Not Progressing Goal: Complications related to the disease process, condition or treatment will be avoided or minimized Outcome: Not Progressing   Problem: Education: Goal: Knowledge of General Education information will improve Description: Including pain rating scale, medication(s)/side effects and non-pharmacologic comfort measures Outcome: Not Progressing   Problem: Health Behavior/Discharge Planning: Goal: Ability to manage health-related needs will improve Outcome: Not Progressing   Problem: Clinical Measurements: Goal: Ability to maintain clinical measurements within normal limits will improve Outcome: Not Progressing Goal: Will remain free from infection Outcome: Not Progressing Goal: Diagnostic test results will improve Outcome: Not Progressing Goal: Respiratory complications will improve Outcome: Not Progressing Goal: Cardiovascular complication will be avoided Outcome: Not Progressing   Problem: Activity: Goal: Risk for activity intolerance will decrease Outcome: Not Progressing   Problem: Nutrition: Goal: Adequate nutrition will be maintained Outcome: Not Progressing   Problem: Coping: Goal: Level of anxiety will decrease Outcome: Not Progressing   Problem: Elimination: Goal: Will not experience complications related to bowel motility Outcome: Not Progressing Goal: Will not experience complications related to urinary retention Outcome: Not Progressing   Problem: Pain Managment: Goal: General experience of comfort will improve Outcome: Not Progressing   Problem: Safety: Goal: Ability to remain free from injury will improve Outcome: Not Progressing    Problem: Skin Integrity: Goal: Risk for impaired skin integrity will decrease Outcome: Not Progressing   Problem: Activity: Goal: Ability to tolerate increased activity will improve Outcome: Not Progressing   Problem: Respiratory: Goal: Ability to maintain a clear airway and adequate ventilation will improve Outcome: Not Progressing   Problem: Role Relationship: Goal: Method of communication will improve Outcome: Not Progressing   Problem: Education: Goal: Ability to describe self-care measures that may prevent or decrease complications (Diabetes Survival Skills Education) will improve Outcome: Not Progressing Goal: Individualized Educational Video(s) Outcome: Not Progressing   Problem: Coping: Goal: Ability to adjust to condition or change in health will improve Outcome: Not Progressing   Problem: Fluid Volume: Goal: Ability to maintain a balanced intake and output will improve Outcome: Not Progressing   Problem: Health Behavior/Discharge Planning: Goal: Ability to identify and utilize available resources and services will improve Outcome: Not Progressing Goal: Ability to manage health-related needs will improve Outcome: Not Progressing   Problem: Metabolic: Goal: Ability to maintain appropriate glucose levels will improve Outcome: Not Progressing   Problem: Nutritional: Goal: Maintenance of adequate nutrition will improve Outcome: Not Progressing Goal: Progress toward achieving an optimal weight will improve Outcome: Not Progressing   Problem: Skin Integrity: Goal: Risk for impaired skin integrity will decrease Outcome: Not Progressing   Problem: Tissue Perfusion: Goal: Adequacy of tissue perfusion will improve Outcome: Not Progressing   Problem: Clinical Measurements: Goal: Diagnostic test results will improve Outcome: Not Progressing

## 2022-06-12 NOTE — Progress Notes (Signed)
Overnight On Call Note Patient seen in consultation by Dr. Hortense Ramal at [facility] via tele is now at [facility] for cEEG per telespecialist recs. Seen and briefly examined by me on arrival. On exam, sleeping in bed, does not open eyes to voice, to nox stim, mouths obscenity, moves all 4 to nox stim. No evidence of ongoing clinical seizures. Will get cEEG hooked up. Neurology will follow in the AM after EEG results.  -- Amie Portland, MD Neurologist [facility] Pager: [phone]

## 2022-06-12 NOTE — Progress Notes (Signed)
Pt arrived via EMS in stable condition, not following any commands and response only to painful stimuli, was able to answer 1 or 2 yes or no questions once she was aroused. MEWS score yellow on arrival, previously documented as red. Will continue to actively assess for any changes.

## 2022-06-12 NOTE — Evaluation (Signed)
Occupational Therapy Evaluation Patient Details Name: [name] MRN: [id number] DOB: June 22, 1951 Today's Date: 06/12/2022   History of Present Illness Pt is a 71 y/o female presentsing to  [facility] on [facility]7 with SOB due to COPD exacerbation. Placed on bipap initally, progressing to intubation with ETT 9/9-9/14. Course complicated by new onset HFrEF, acute metabolic encephalopathy and seizures.  Transferred to [facility] on 9/22.  MRI negative, EEG 9/22 negative for seizures, moderate diffuse encephalopathy. PMH includes: HTN, CVA, CAD, blind R eye, depression, psychosis.   Clinical Impression   PTA patient independent and driving. Admitted for above and presents with problem list below, including lethargy, impaired cognition, generalized weakness, impaired balance.  She demonstrates limited interaction with therapists, able to correct the pronunciation of her name and voicing "no" when asked to open her eyes but otherwise did not engage.  She follows 1 command to squeeze her R hand.  Overall requires total assist for self care, total assist +2 for bed mobility.  Based on performance today, believe she will best benefit in SNF level rehab to optimize return to PLOF. Will follow acutely.      Recommendations for follow up therapy are one component of a multi-disciplinary discharge planning process, led by the attending physician.  Recommendations may be updated based on patient status, additional functional criteria and insurance authorization.   Follow Up Recommendations  Skilled nursing-short term rehab (<3 hours/day)    Assistance Recommended at Discharge Frequent or constant Supervision/Assistance  Patient can return home with the following Other (comment) (total care)    Functional Status Assessment  Patient has had a recent decline in their functional status and demonstrates the ability to make significant improvements in function in a reasonable and predictable amount of time.  Equipment  Recommendations  Other (comment) (defer)    Recommendations for Other Services       Precautions / Restrictions Precautions Precautions: Fall Precaution Comments: long term EEG Restrictions Weight Bearing Restrictions: No      Mobility Bed Mobility Overal bed mobility: Needs Assistance Bed Mobility: Supine to Sit, Sit to Sidelying, Rolling Rolling: Total assist, +2 for physical assistance, +2 for safety/equipment   Supine to sit: Total assist, +2 for physical assistance, +2 for safety/equipment   Sit to sidelying: Total assist, +2 for physical assistance, +2 for safety/equipment General bed mobility comments: pt turned to R side of bed, total assist +2  to transition to EOB and return to supine    Transfers                   General transfer comment: deferred      Balance Overall balance assessment: Needs assistance Sitting-balance support: Feet supported, No upper extremity supported Sitting balance-Leahy Scale: Poor Sitting balance - Comments: no righting reactions, heavy posterior lean when support removed.  to maintain upright posture requires max-total assist       Standing balance comment: deferred                           ADL either performed or assessed with clinical judgement   ADL Overall ADL's : Needs assistance/impaired                                     Functional mobility during ADLs: Total assistance;+2 for physical assistance;+2 for safety/equipment General ADL Comments: hand over hand to wash face, total  assist.     Vision Baseline Vision/History:  (blind R eye baseline) Ability to See in Adequate Light: 1 Impaired Vision Assessment?: Vision impaired- to be further tested in functional context Additional Comments: keeps eyes closed, when asked to open voiced "no"; attempted assistin opening L eye and pt holding closed. R eye baseline blind.     Perception     Praxis      Pertinent Vitals/Pain Pain  Assessment Pain Assessment: Faces Faces Pain Scale: Hurts little more Pain Location: buttocks (son reports wound) Pain Descriptors / Indicators: Discomfort, Grimacing Pain Intervention(s): Limited activity within patient's tolerance, Monitored during session, Repositioned     Hand Dominance     Extremity/Trunk Assessment Upper Extremity Assessment Upper Extremity Assessment: RUE deficits/detail;LUE deficits/detail;Difficult to assess due to impaired cognition RUE Deficits / Details: squeezing hand to command x 1, resistive to ROM with elbow held in flexion. Limited assessment due to cognition. LUE Deficits / Details: does not squeeze hand on command, resistive to ROM with elbow held in flexion. Limited assessment due to cognition.   Lower Extremity Assessment Lower Extremity Assessment: Defer to PT evaluation       Communication Communication Communication: Receptive difficulties   Cognition Arousal/Alertness: Lethargic Behavior During Therapy: Flat affect Overall Cognitive Status: Difficult to assess Area of Impairment: Following commands, Awareness                       Following Commands: Follows one step commands inconsistently   Awareness: Intellectual   General Comments: pt repsonds to her name and corrects therapist on how to pronouce.  Otherwise able to squeeze R hand 1x to command, not following further commands. Voices "no" when asked pt to open her eyes, as session progressed pt with increased lethargy and decreased interactions.     General Comments  son present and supportive, O2 on 2L upon exit RA at EOB maintained SPO2 but supine SpO2 rnaged from 87-89% on RA    Exercises     Shoulder Instructions      Home Living Family/patient expects to be discharged to:: Private residence Living Arrangements: Alone Available Help at Discharge: Family;Available PRN/intermittently Type of Home: House Home Access: Stairs to enter [employer] of  Steps: 6 Entrance Stairs-Rails: Right;Left;Can reach both Home Layout: One level     Bathroom Shower/Tub: Tub only;Walk-in shower   Bathroom Toilet: Handicapped height     Home Equipment: Agricultural consultant (2 wheels);Cane - single point;BSC/3in1;Shower seat   Additional Comments: son reports has RW, unsure of other DME- above is from [facility] eval      Prior Functioning/Environment Prior Level of Function : Independent/Modified Independent;Driving                        OT Problem List: Decreased strength;Decreased range of motion;Decreased activity tolerance;Impaired balance (sitting and/or standing);Impaired vision/perception;Decreased coordination;Decreased cognition;Decreased safety awareness;Decreased knowledge of use of DME or AE;Decreased knowledge of precautions;Cardiopulmonary status limiting activity;Impaired tone;Impaired UE functional use;Pain      OT Treatment/Interventions: Self-care/ADL training;Therapeutic exercise;DME and/or AE instruction;Therapeutic activities;Cognitive remediation/compensation;Patient/family education;Balance training;Visual/perceptual remediation/compensation    OT Goals(Current goals can be found in the care plan section) Acute Rehab OT Goals Patient Stated Goal: none stated OT Goal Formulation: Patient unable to participate in goal setting Time For Goal Achievement: 06/26/22 Potential to Achieve Goals: Fair  OT Frequency: Min 2X/week    Co-evaluation PT/OT/SLP Co-Evaluation/Treatment: Yes Reason for Co-Treatment: Complexity of the patient's impairments (multi-system involvement)   OT  goals addressed during session: ADL's and self-care      AM-PAC OT "6 Clicks" Daily Activity     Outcome Measure Help from another person eating meals?: Total Help from another person taking care of personal grooming?: Total Help from another person toileting, which includes using toliet, bedpan, or urinal?: Total Help from another person bathing  (including washing, rinsing, drying)?: Total Help from another person to put on and taking off regular upper body clothing?: Total Help from another person to put on and taking off regular lower body clothing?: Total 6 Click Score: 6   End of Session Nurse Communication: Mobility status  Activity Tolerance: Patient limited by lethargy Patient left: in bed;with call bell/phone within reach;with bed alarm set;with nursing/sitter in room  OT Visit Diagnosis: Other abnormalities of gait and mobility (R26.89);Muscle weakness (generalized) (M62.81);Other symptoms and signs involving cognitive function;Pain Pain - part of body:  (bottom)                Time: 7510-2585 OT Time Calculation (min): 22 min Charges:  OT General Charges $OT Visit: 1 Visit OT Evaluation $OT Eval Moderate Complexity: 1 Mod  Jolaine Artist, OT Acute Rehabilitation Services Office [phone]   Delight Stare 06/12/2022, 2:00 PM

## 2022-06-12 NOTE — Progress Notes (Addendum)
PROGRESS NOTE        PATIENT DETAILS Name: [name] Age: 71 y.o. Sex: female Date of Birth: March 22, 1951 Admit Date: 05/28/2022 Admitting Physician Ejiroghene Arlyce Dice, MD QQI:WLNLG, [name], MD  Brief Summary: Patient is a 71 y.o.  female with history of COPD, HTN, HLD, anxiety, prior CVA who was admitted to [facility] on 9/7 with shortness of breath due to COPD exacerbation-she was initially placed on BiPAP, however she continued to worsen and required intubation.  She was stabilized-extubated-further hospital course complicated by new onset HFrEF (possibly stress-induced), acute metabolic encephalopathy and seizures.  She was transferred to [facility] on 9/22 for LTM EEG and neurology evaluation.  Please see below for further details   Significant events: 9/07>> SOB-BiPAP-admit to [facility] at [facility]. 9/09>> intubated 9/14>> extubated 9/20>> telemetry neurology consult for confusion/encephalopathy.  EEG no seizures. 9/21>> seizure-like activity. 9/22>> transferred to [facility] LTM EEG/neurology evaluation  Significant studies: 9/08>> CT angio chest: No PE/dissection-severe COPD, patchy infiltrates right lung. 9/08>> Echo: EF 35-40%, multiple wall motion abnormalities. 9/10>> Limited Echo: EF 35-40%, mid septal/apical LV segments are akinetic. 9/20>> CT head: Multiple age indeterminate infratentorial/bilateral supratentorial cortical hypodensities-worrisome for age-indeterminate infarcts. 9/20>> MRI brain: Limited by Motion artifact-but no evidence of acute/subacute infarct 9/20>> MRA brain/neck: Limited by motion artifact-but no significant stenosis observed. 9/20>> EEG: No seizures  Significant microbiology data: 9/07>> COVID/influenza PCR: Negative 9/10>> tracheal aspirate: No growth 9/12>> urine culture: No growth 9/21>> blood culture: No growth  Procedures: 9/09-9/14>> ETT  Consults: Neurology, pulmonology, neurology, palliative  care  Subjective: Easily arouses to loud verbal stimuli-moves all 4 extremities but not following commands.  Objective: Vitals: Blood pressure (!) 152/74, pulse 97, temperature 97.9 F (36.6 C), temperature source Axillary, resp. rate 17, height 5' (1.524 m), weight 44.8 kg, SpO2 100 %.   Exam: Gen Exam: Not in any distress HEENT:atraumatic, normocephalic Chest: B/L clear to auscultation anteriorly CVS:S1S2 regular Abdomen:soft non tender, non distended Extremities:no edema Neurology: Non focal-but with generalized weakness. Skin: no rash  Pertinent Labs/Radiology:    Latest Ref Rng & Units 06/12/2022    2:43 AM 06/11/2022    6:24 AM 06/10/2022    4:14 AM  CBC  WBC 4.0 - 10.5 K/uL 15.2  13.5  20.5   Hemoglobin 12.0 - 15.0 g/dL 12.2  9.2  11.4   Hematocrit 36.0 - 46.0 % 37.1  29.3  37.7   Platelets 150 - 400 K/uL 86  87  127     Lab Results  Component Value Date   NA 140 06/12/2022   K 3.9 06/12/2022   CL 100 06/12/2022   CO2 29 06/12/2022      Assessment/Plan: Acute hypoxic/hypercarbic respiratory failure due to other focal PNA and COPD exacerbation.  Extubated on 9/14-currently stable on just 2 L of oxygen.  Has completed a course of antibiotics-continue bronchodilators.  Acute metabolic encephalopathy: Due to seizures-possible hospital induced delirium playing a role as well.  Neuroimaging negative-although MRI limited by motion artifact.  Spot EEG negative-neurology following.  Supportive care in the interim.    Generalized tonic-clonic seizure: LTM EEG in progress-neuroimaging negative for structural abnormalities-await recommendations from neurology.  Currently not on any AEDs.  Elevated troponins due to demand ischemia: No obvious anginal symptoms-cardiology following-plans are for LHC once for stabilized.  Resume antiplatelets when her oral intake is stable.  She is  currently encephalopathic.  HFrEF-stress-induced/Takotsubo cardiomyopathy: Volume status  stable-plan for LHC to rule out ischemic etiology-once she is less encephalopathic and more medically stable.  Cardiology following.  Continue metoprolol, losartan/Aldactone-use Lasix as needed.  History of CAD-/PCI 1997, CTO of RCA 2004: Continue beta-blocker/statin/Plavix.  History of CVA with residual right eye blindness: Moving all 4 extremities-continue antiplatelet/statin.  HTN: BP stable-continue bisoprolol/losartan-follow and optimize.  CKD stage IIIa: At baseline.  Normocytic anemia: Mild-hemoglobin seems to have improved-suspect this was from CKD/critical illness.  No evidence of blood loss.  Thrombocytopenia: Unclear etiology-mild-watch for now.  GERD: Continue PPI  Oropharyngeal dysphagia: In the setting of encephalopathy-suspect she will require cotrak tube insertion and  NG feeding.  Addendum: Evaluated by SLP-cleared for diet.  Hold off on placing NG tube.  Debility/deconditioning: PT/OT eval.  Nutrition Status: Nutrition Problem: Moderate Malnutrition Etiology: chronic illness Signs/Symptoms: percent weight loss, mild fat depletion, mild muscle depletion Percent weight loss: 16 % Interventions: Ensure Enlive (each supplement provides 350kcal and 20 grams of protein)   Pressure Ulcer: Pressure Injury 06/06/22 Sacrum Medial Stage 2 -  Partial thickness loss of dermis presenting as a shallow open injury with a red, pink wound bed without slough. (Active)  06/06/22 2159  Location: Sacrum  Location Orientation: Medial  Staging: Stage 2 -  Partial thickness loss of dermis presenting as a shallow open injury with a red, pink wound bed without slough.  Wound Description (Comments):   Present on Admission: No  Dressing Type Foam - Lift dressing to assess site every shift 06/12/22 0038    BMI: Estimated body mass index is 19.29 kg/m as calculated from the following:   Height as of this encounter: 5' (1.524 m).   Weight as of this encounter: 44.8 kg.   Code  status:   Code Status: Full Code   DVT Prophylaxis: SCDs for now-on aspirin/Plavix-platelet count downtrending.  Reassess over the next few days.   Family Communication: Son Mark-[phone]-updated over the phone on 9/22.  I have asked him to update rest of the family members.  Disposition Plan: Status is: Inpatient Remains inpatient appropriate because: Possible new onset generalized tonic-clonic seizures-encephalopathic-LTM EEG in progress-neurology evaluation in progress-not stable for discharge.   Planned Discharge Destination:Skilled nursing facility   Diet: Diet Order             Diet NPO time specified Except for: Sips with Meds  Diet effective now                     Antimicrobial agents: Anti-infectives (From admission, onward)    Start     Dose/Rate Route Frequency Ordered Stop   06/06/22 1400  aztreonam (AZACTAM) 1 g in sodium chloride 0.9 % 100 mL IVPB        1 g 200 mL/hr over 30 Minutes Intravenous Every 8 hours 06/06/22 0933 06/08/22 1405   06/06/22 1400  vancomycin (VANCOREADY) IVPB 750 mg/150 mL        750 mg 150 mL/hr over 60 Minutes Intravenous Every 24 hours 06/06/22 0933 06/07/22 1627   05/30/22 1400  vancomycin (VANCOREADY) IVPB 750 mg/150 mL  Status:  Discontinued        750 mg 150 mL/hr over 60 Minutes Intravenous Every 24 hours 05/29/22 1324 06/06/22 0933   05/29/22 1415  vancomycin (VANCOCIN) IVPB 1000 mg/200 mL premix        1,000 mg 200 mL/hr over 60 Minutes Intravenous  Once 05/29/22 1322 05/29/22 1451   05/29/22 1400  aztreonam (AZACTAM) 1 g in sodium chloride 0.9 % 100 mL IVPB  Status:  Discontinued        1 g 200 mL/hr over 30 Minutes Intravenous Every 8 hours 05/29/22 1311 06/06/22 0933   05/29/22 1400  azithromycin (ZITHROMAX) 500 mg in sodium chloride 0.9 % 250 mL IVPB  Status:  Discontinued        500 mg 250 mL/hr over 60 Minutes Intravenous Every 24 hours 05/29/22 1311 06/06/22 0932        MEDICATIONS: Scheduled Meds:   arformoterol  15 mcg Nebulization BID   aspirin  300 mg Rectal Daily   budesonide (PULMICORT) nebulizer solution  0.5 mg Nebulization BID   Chlorhexidine Gluconate Cloth  6 each Topical Q0600   [START ON 06/14/2022] citalopram  20 mg Oral Daily   [START ON 06/14/2022] clopidogrel  75 mg Oral Daily   feeding supplement  237 mL Oral TID BM   insulin aspart  0-9 Units Subcutaneous Q4H   LORazepam  1 mg Intravenous Once   [START ON 06/14/2022] losartan  12.5 mg Oral Daily   metoprolol tartrate  5 mg Intravenous Q8H   mupirocin ointment   Nasal BID   pantoprazole (PROTONIX) IV  40 mg Intravenous Q12H   revefenacin  175 mcg Nebulization Daily   [START ON 06/14/2022] rosuvastatin  20 mg Oral QPM   sodium chloride flush  10-40 mL Intracatheter Q12H   [START ON 06/14/2022] spironolactone  12.5 mg Oral Daily   Continuous Infusions:  sodium chloride Stopped (06/06/22 2139)   dextrose 5 % and 0.45% NaCl 100 mL/hr at 06/11/22 1852   PRN Meds:.sodium chloride, acetaminophen **OR** acetaminophen, albuterol, diphenoxylate-atropine, haloperidol lactate, hydrALAZINE, LORazepam, metoprolol tartrate, nitroGLYCERIN, ondansetron **OR** ondansetron (ZOFRAN) IV, mouth rinse, polyethylene glycol, polyvinyl alcohol, sodium chloride flush   I have personally reviewed following labs and imaging studies  LABORATORY DATA: CBC: Recent Labs  Lab 06/08/22 0618 06/09/22 0406 06/10/22 0414 06/11/22 0624 06/12/22 0243  WBC 12.9* 14.5* 20.5* 13.5* 15.2*  NEUTROABS  --   --   --  11.1* 12.7*  HGB 11.5* 11.3* 11.4* 9.2* 12.2  HCT 36.7 35.4* 37.7 29.3* 37.1  MCV 112.6* 109.9* 117.4* 111.4* 107.5*  PLT 152 143* 127* 87* 86*    Basic Metabolic Panel: Recent Labs  Lab 06/07/22 0603 06/08/22 0921 06/09/22 0406 06/10/22 0414 06/11/22 0624 06/12/22 0243  NA 146* 145 148* 148* 136 140  K 3.3* 3.1* 3.2* 3.4* 4.3 3.9  CL 112* 115* 112* 108 99 100  CO2 26 21* 30 30 31 29   GLUCOSE 97 111* 98 121* 334* 113*  BUN 22  16 17 17 10 9   CREATININE 0.82 0.71 0.79 1.12* 0.88 0.92  CALCIUM 8.8* 8.3* 9.0 9.1 7.7* 8.8*  MG 2.0  --  1.7 2.0  --   --   PHOS  --   --   --   --  2.4*  --     GFR: Estimated Creatinine Clearance: 39.1 mL/min (by C-G formula based on SCr of 0.92 mg/dL).  Liver Function Tests: Recent Labs  Lab 06/10/22 0414 06/11/22 0624 06/12/22 0243  AST 39 34 38  ALT 99* 70* 69*  ALKPHOS 60 49 60  BILITOT 1.2 1.2 1.2  PROT 5.4* 4.6* 5.4*  ALBUMIN 2.9* 2.3* 2.6*   No results for input(s): "LIPASE", "AMYLASE" in the last 168 hours. Recent Labs  Lab 06/10/22 1924  AMMONIA 27    Coagulation Profile: No results for input(s): "INR", "PROTIME"  in the last 168 hours.  Cardiac Enzymes: Recent Labs  Lab 06/11/22 0816  CKTOTAL 78    BNP (last 3 results) No results for input(s): "PROBNP" in the last 8760 hours.  Lipid Profile: No results for input(s): "CHOL", "HDL", "LDLCALC", "TRIG", "CHOLHDL", "LDLDIRECT" in the last 72 hours.  Thyroid Function Tests: No results for input(s): "TSH", "T4TOTAL", "FREET4", "T3FREE", "THYROIDAB" in the last 72 hours.  Anemia Panel: Recent Labs    06/11/22 0908 06/12/22 0620  VITAMINB12  --  1,258*  FOLATE 19.4  --     Urine analysis:    Component Value Date/Time   COLORURINE YELLOW 06/11/2022 0624   APPEARANCEUR CLEAR 06/11/2022 0624   LABSPEC 1.015 06/11/2022 0624   PHURINE 6.0 06/11/2022 0624   GLUCOSEU NEGATIVE 06/11/2022 0624   HGBUR NEGATIVE 06/11/2022 0624   BILIRUBINUR NEGATIVE 06/11/2022 0624   KETONESUR NEGATIVE 06/11/2022 0624   PROTEINUR 30 (A) 06/11/2022 0624   UROBILINOGEN 1.0 07/16/2014 0244   NITRITE NEGATIVE 06/11/2022 0624   LEUKOCYTESUR NEGATIVE 06/11/2022 0624    Sepsis Labs: Lactic Acid, Venous No results found for: "LATICACIDVEN"  MICROBIOLOGY: Recent Results (from the past 240 hour(s))  Urine Culture     Status: None   Collection Time: 06/02/22  4:37 PM   Specimen: Urine, Catheterized  Result Value Ref  Range Status   Specimen Description   Final    URINE, CATHETERIZED Performed at [facility], [address] [place], [place] [address]    Special Requests   Final    NONE Performed at [facility], [address] [place], [place] [address]    Culture   Final    NO GROWTH Performed at [facility], [place] [address] [place], [place] [address]    Report Status 06/03/2022 FINAL  Final  Culture, blood (Routine X 2) w Reflex to ID Panel     Status: None (Preliminary result)   Collection Time: 06/11/22  5:09 PM   Specimen: Right Antecubital; Blood  Result Value Ref Range Status   Specimen Description RIGHT ANTECUBITAL  Final   Special Requests   Final    BOTTLES DRAWN AEROBIC AND ANAEROBIC Blood Culture adequate volume Performed at [facility], [address], [place],  [address]    Culture PENDING  Incomplete   Report Status PENDING  Incomplete    RADIOLOGY STUDIES/RESULTS: DG CHEST PORT 1 VIEW  Result Date: 06/12/2022 CLINICAL DATA:  Shortness of breath EXAM: PORTABLE CHEST 1 VIEW COMPARISON:  06/11/2022 FINDINGS: Similar hyperinflation suggesting background COPD/emphysema. Stable heart size and vascular congestion. Similar small posterior layering right effusion and right base atelectasis pattern. Stable left lung aeration. Trachea midline. Aorta atherosclerotic. Skin fold overlies the right chest. 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 Electronically Signed   By: Jerilynn Mages.  Shick M.D.   On: 06/12/2022 06:57   DG Chest 1 View  Result Date: 06/11/2022 CLINICAL DATA:  Shortness of breath.  Code stroke. EXAM: CHEST  1 VIEW COMPARISON:  06/07/2022 FINDINGS: Lungs are hyperexpanded. Bullous changes noted right upper lobe, as before. Left PICC line tip overlies the distal SVC level. Interstitial markings are diffusely coarsened with chronic features. Interval development of streaky opacity at the  right base, likely atelectatic with probable tiny right pleural effusion. Prominent skin folds overlie the lateral left lung. Telemetry leads overlie the chest. IMPRESSION: Interval development of streaky opacity at the right base, likely atelectatic with probable tiny right pleural effusion. Electronically Signed  By: Misty Stanley M.D.   On: 06/11/2022 06:36   MR BRAIN WO CONTRAST  Result Date: 06/10/2022 CLINICAL DATA:  Altered mental status stroke suspected EXAM: MRI HEAD WITHOUT CONTRAST MRA HEAD WITHOUT CONTRAST MRA NECK WITHOUT CONTRAST TECHNIQUE: Multiplanar, multi-echo pulse sequences of the brain and surrounding structures were acquired without intravenous contrast. Angiographic images of the Circle of Willis were acquired using MRA technique without intravenous contrast. Angiographic images of the neck were acquired using MRA technique without intravenous contrast. Carotid stenosis measurements (when applicable) are obtained utilizing NASCET criteria, using the distal internal carotid diameter as the denominator. COMPARISON:  No prior MRI or MRA, correlation is made with CT head 06/10/2022 FINDINGS: Evaluation is limited by motion artifact. MRI HEAD FINDINGS Brain: No restricted diffusion to suggest acute or subacute infarct. Remaining sequences are severely motion limited, but no definite hemorrhage, mass, mass effect, or midline shift. No definite hydrocephalus or extra-axial collection. Vascular: Please see MRA findings below. Skull and upper cervical spine: Very motion limited. Sinuses/Orbits: No acute finding. Other: Fluid in the mastoid air cells bilaterally. MRA HEAD FINDINGS Evaluation is significantly limited by motion. Flow is grossly seen in the intracranial internal carotid arteries and proximal middle and anterior cerebral arteries. Flow is seen in the distal ACAs and mid to distal left and mid right MCA branches. Flow is grossly seen in the vertebral arteries, basilar artery and some of  the posterior cerebral artery branches. MRA NECK FINDINGS Evaluation is somewhat motion limited. Standard aortic branching without evidence aneurysm or dissection. There does not appear to be hemodynamically significant stenosis at the ICA bifurcations. The extracranial vertebral arteries appear patent throughout their course. IMPRESSION: 1. Evaluation of the brain is significantly limited by motion artifact. The diffusion-weighted sequence is less motion sensitive and, on this sequence, there is no evidence of acute or subacute infarct. 2. Evaluation of the intracranial vasculature is significantly limited by motion. Flow is grossly seen in the proximal intracranial vessels, but a detailed description of stenosis is not possible. 3. Evaluation of the neck vasculature is limited by motion. Within this limitation, no hemodynamically significant stenosis in the neck. Electronically Signed   By: Merilyn Baba M.D.   On: 06/10/2022 18:38   MR ANGIO HEAD WO CONTRAST  Result Date: 06/10/2022 CLINICAL DATA:  Altered mental status stroke suspected EXAM: MRI HEAD WITHOUT CONTRAST MRA HEAD WITHOUT CONTRAST MRA NECK WITHOUT CONTRAST TECHNIQUE: Multiplanar, multi-echo pulse sequences of the brain and surrounding structures were acquired without intravenous contrast. Angiographic images of the Circle of Willis were acquired using MRA technique without intravenous contrast. Angiographic images of the neck were acquired using MRA technique without intravenous contrast. Carotid stenosis measurements (when applicable) are obtained utilizing NASCET criteria, using the distal internal carotid diameter as the denominator. COMPARISON:  No prior MRI or MRA, correlation is made with CT head 06/10/2022 FINDINGS: Evaluation is limited by motion artifact. MRI HEAD FINDINGS Brain: No restricted diffusion to suggest acute or subacute infarct. Remaining sequences are severely motion limited, but no definite hemorrhage, mass, mass effect, or  midline shift. No definite hydrocephalus or extra-axial collection. Vascular: Please see MRA findings below. Skull and upper cervical spine: Very motion limited. Sinuses/Orbits: No acute finding. Other: Fluid in the mastoid air cells bilaterally. MRA HEAD FINDINGS Evaluation is significantly limited by motion. Flow is grossly seen in the intracranial internal carotid arteries and proximal middle and anterior cerebral arteries. Flow is seen in the distal ACAs and mid to distal left and mid right  MCA branches. Flow is grossly seen in the vertebral arteries, basilar artery and some of the posterior cerebral artery branches. MRA NECK FINDINGS Evaluation is somewhat motion limited. Standard aortic branching without evidence aneurysm or dissection. There does not appear to be hemodynamically significant stenosis at the ICA bifurcations. The extracranial vertebral arteries appear patent throughout their course. IMPRESSION: 1. Evaluation of the brain is significantly limited by motion artifact. The diffusion-weighted sequence is less motion sensitive and, on this sequence, there is no evidence of acute or subacute infarct. 2. Evaluation of the intracranial vasculature is significantly limited by motion. Flow is grossly seen in the proximal intracranial vessels, but a detailed description of stenosis is not possible. 3. Evaluation of the neck vasculature is limited by motion. Within this limitation, no hemodynamically significant stenosis in the neck. Electronically Signed   By: Merilyn Baba M.D.   On: 06/10/2022 18:38   MR ANGIO NECK WO CONTRAST  Result Date: 06/10/2022 CLINICAL DATA:  Altered mental status stroke suspected EXAM: MRI HEAD WITHOUT CONTRAST MRA HEAD WITHOUT CONTRAST MRA NECK WITHOUT CONTRAST TECHNIQUE: Multiplanar, multi-echo pulse sequences of the brain and surrounding structures were acquired without intravenous contrast. Angiographic images of the Circle of Willis were acquired using MRA technique  without intravenous contrast. Angiographic images of the neck were acquired using MRA technique without intravenous contrast. Carotid stenosis measurements (when applicable) are obtained utilizing NASCET criteria, using the distal internal carotid diameter as the denominator. COMPARISON:  No prior MRI or MRA, correlation is made with CT head 06/10/2022 FINDINGS: Evaluation is limited by motion artifact. MRI HEAD FINDINGS Brain: No restricted diffusion to suggest acute or subacute infarct. Remaining sequences are severely motion limited, but no definite hemorrhage, mass, mass effect, or midline shift. No definite hydrocephalus or extra-axial collection. Vascular: Please see MRA findings below. Skull and upper cervical spine: Very motion limited. Sinuses/Orbits: No acute finding. Other: Fluid in the mastoid air cells bilaterally. MRA HEAD FINDINGS Evaluation is significantly limited by motion. Flow is grossly seen in the intracranial internal carotid arteries and proximal middle and anterior cerebral arteries. Flow is seen in the distal ACAs and mid to distal left and mid right MCA branches. Flow is grossly seen in the vertebral arteries, basilar artery and some of the posterior cerebral artery branches. MRA NECK FINDINGS Evaluation is somewhat motion limited. Standard aortic branching without evidence aneurysm or dissection. There does not appear to be hemodynamically significant stenosis at the ICA bifurcations. The extracranial vertebral arteries appear patent throughout their course. IMPRESSION: 1. Evaluation of the brain is significantly limited by motion artifact. The diffusion-weighted sequence is less motion sensitive and, on this sequence, there is no evidence of acute or subacute infarct. 2. Evaluation of the intracranial vasculature is significantly limited by motion. Flow is grossly seen in the proximal intracranial vessels, but a detailed description of stenosis is not possible. 3. Evaluation of the neck  vasculature is limited by motion. Within this limitation, no hemodynamically significant stenosis in the neck. Electronically Signed   By: Merilyn Baba M.D.   On: 06/10/2022 18:38   EEG adult  Result Date: 06/10/2022 Lora Havens, MD     06/10/2022  5:15 PM Patient Name: GLENNIS CLESTER MRN: [id number] Epilepsy Attending: Lora Havens Referring Physician/Provider: Rolla Plate, DO Date: 06/10/2022 Duration: 21.57 mins Patient history: 71yo F with seizure like activity. EEG to evaluate for seizure Level of alertness: lethargic AEDs during EEG study: Ativan Technical aspects: This EEG study was done with scalp  electrodes positioned according to the 10-20 International system of electrode placement. Electrical activity was reviewed with band pass filter of 1-70Hz , sensitivity of 7 uV/mm, display speed of 57mm/sec with a 60Hz  notched filter applied as appropriate. EEG data were recorded continuously and digitally stored.  Video monitoring was available and reviewed as appropriate. Description: No posterior dominant rhythm was seen. EEG showed continuous generalized polymorphic 3 to 6 Hz theta-delta slowing. Generalized periodic discharges with triphasic morphology at 1-1.5 Hz were also noted, more prominent when awake/stimulated. Hyperventilation and photic stimulation were not performed.   ABNORMALITY - Periodic discharges with triphasic morphology, generalized ( GPDs) - Continuous slow, generalized IMPRESSION: This study showed generalized periodic discharges with triphasic morphology which can be on the ictal-interictal continuum. However, the morphology, frequency and reactivity to stimulation is more commonly indicative of toxic-metabolic causes. Additionally there was  moderate diffuse encephalopathy, nonspecific etiology. No seizures were seen throughout the recording. [place]   CT HEAD WO CONTRAST (5MM)  Result Date: 06/10/2022 CLINICAL DATA:  Altered mental status EXAM: CT HEAD  WITHOUT CONTRAST TECHNIQUE: Contiguous axial images were obtained from the base of the skull through the vertex without intravenous contrast. RADIATION DOSE REDUCTION: This exam was performed according to the departmental dose-optimization program which includes automated exposure control, adjustment of the mA and/or kV according to patient size and/or use of iterative reconstruction technique. COMPARISON:  None Available. FINDINGS: Brain: No evidence of hemorrhage, hydrocephalus, extra-axial collection or mass lesion/mass effect. There is extensive subcortical hypodensity in the bilateral cerebral hemispheres. There are age-indeterminate infarcts in the right cerebellum (series 3, image 4), left temporal lobe (series 3, image 8-9), the corona radiata on the left (series 3, image 16) and possibly the left lentiform nucleus (series 13, image 30). Vascular: No hyperdense vessel or unexpected calcification. Skull: Pneumatization of the right petrous apex. Sinuses/Orbits: Bilateral lens replacements. Assessment of the bilateral paranasal sinuses is limited due to the degree of motion artifact. Other: None. IMPRESSION: 1. There are multiple age-indeterminate infratentorial and bilateral supratentorial cortical hypodensities, worrisome for age indeterminate infarcts. Recommend further evaluation with a brain MRI. 2. There is extensive subcortical hypodensity, which is nonspecific, and may represent sequela of chronic microvascular ischemic change. This can also be assessed on follow up brian MRI. Electronically Signed   By: Marin Roberts M.D.   On: 06/10/2022 15:42     LOS: 15 days   Oren Binet, MD  [facility]    To contact the attending provider between 7A-7P or the covering provider during after hours 7P-7A, please log into the web site [email or web address] and access using universal [place] password for that web site. If you do not have the password, please call the hospital operator.  06/12/2022,  10:02 AM

## 2022-06-13 DIAGNOSIS — J441 Chronic obstructive pulmonary disease with (acute) exacerbation: Secondary | ICD-10-CM | POA: Diagnosis not present

## 2022-06-13 DIAGNOSIS — R778 Other specified abnormalities of plasma proteins: Secondary | ICD-10-CM | POA: Diagnosis not present

## 2022-06-13 DIAGNOSIS — J9601 Acute respiratory failure with hypoxia: Secondary | ICD-10-CM | POA: Diagnosis not present

## 2022-06-13 DIAGNOSIS — E43 Unspecified severe protein-calorie malnutrition: Secondary | ICD-10-CM | POA: Insufficient documentation

## 2022-06-13 DIAGNOSIS — I428 Other cardiomyopathies: Secondary | ICD-10-CM | POA: Diagnosis not present

## 2022-06-13 LAB — CBC WITH DIFFERENTIAL/PLATELET
Abs Immature Granulocytes: 0.04 10*3/uL (ref 0.00–0.07)
Basophils Absolute: 0 10*3/uL (ref 0.0–0.1)
Basophils Relative: 0 %
Eosinophils Absolute: 0 10*3/uL (ref 0.0–0.5)
Eosinophils Relative: 0 %
HCT: 30.4 % — ABNORMAL LOW (ref 36.0–46.0)
Hemoglobin: 9.9 g/dL — ABNORMAL LOW (ref 12.0–15.0)
Immature Granulocytes: 0 %
Lymphocytes Relative: 10 %
Lymphs Abs: 1.1 10*3/uL (ref 0.7–4.0)
MCH: 35.2 pg — ABNORMAL HIGH (ref 26.0–34.0)
MCHC: 32.6 g/dL (ref 30.0–36.0)
MCV: 108.2 fL — ABNORMAL HIGH (ref 80.0–100.0)
Monocytes Absolute: 0.9 10*3/uL (ref 0.1–1.0)
Monocytes Relative: 9 %
Neutro Abs: 8.5 10*3/uL — ABNORMAL HIGH (ref 1.7–7.7)
Neutrophils Relative %: 81 %
Platelets: 88 10*3/uL — ABNORMAL LOW (ref 150–400)
RBC: 2.81 MIL/uL — ABNORMAL LOW (ref 3.87–5.11)
RDW: 14.9 % (ref 11.5–15.5)
WBC: 10.5 10*3/uL (ref 4.0–10.5)
nRBC: 0 % (ref 0.0–0.2)

## 2022-06-13 LAB — GLUCOSE, CAPILLARY
Glucose-Capillary: 109 mg/dL — ABNORMAL HIGH (ref 70–99)
Glucose-Capillary: 136 mg/dL — ABNORMAL HIGH (ref 70–99)
Glucose-Capillary: 136 mg/dL — ABNORMAL HIGH (ref 70–99)
Glucose-Capillary: 95 mg/dL (ref 70–99)

## 2022-06-13 LAB — COMPREHENSIVE METABOLIC PANEL
ALT: 52 U/L — ABNORMAL HIGH (ref 0–44)
AST: 32 U/L (ref 15–41)
Albumin: 2.3 g/dL — ABNORMAL LOW (ref 3.5–5.0)
Alkaline Phosphatase: 54 U/L (ref 38–126)
Anion gap: 10 (ref 5–15)
BUN: 6 mg/dL — ABNORMAL LOW (ref 8–23)
CO2: 29 mmol/L (ref 22–32)
Calcium: 8.7 mg/dL — ABNORMAL LOW (ref 8.9–10.3)
Chloride: 99 mmol/L (ref 98–111)
Creatinine, Ser: 0.82 mg/dL (ref 0.44–1.00)
GFR, Estimated: >60 mL/min (ref >60)
Glucose, Bld: 221 mg/dL — ABNORMAL HIGH (ref 70–99)
Potassium: 3 mmol/L — ABNORMAL LOW (ref 3.5–5.1)
Sodium: 138 mmol/L (ref 135–145)
Total Bilirubin: 1.3 mg/dL — ABNORMAL HIGH (ref 0.3–1.2)
Total Protein: 4.8 g/dL — ABNORMAL LOW (ref 6.5–8.1)

## 2022-06-13 MED ORDER — POTASSIUM CHLORIDE 20 MEQ PO PACK
40.0000 meq | PACK | Freq: Once | ORAL | Status: DC
  Filled 2022-06-13: qty 2

## 2022-06-13 NOTE — Progress Notes (Addendum)
PROGRESS NOTE        PATIENT DETAILS Name: [name] Age: 71 y.o. Sex: female Date of Birth: 1950-10-13 Admit Date: 05/28/2022 Admitting Physician Ejiroghene Arlyce Dice, MD UD:[id number], [name], MD  Brief Summary: Patient is a 71 y.o.  female with history of COPD, HTN, HLD, anxiety, prior CVA who was admitted to [facility] on 9/7 with shortness of breath due to COPD exacerbation-she was initially placed on BiPAP, however she continued to worsen and required intubation.  She was stabilized-extubated-further hospital course complicated by new onset HFrEF (possibly stress-induced), acute metabolic encephalopathy and seizures.  She was transferred to [facility] on 9/22 for LTM EEG and neurology evaluation.  Please see below for further details   Significant events: 9/07>> SOB-BiPAP-admit to [facility] at [facility]. 9/09>> intubated 9/14>> extubated 9/20>> telemetry neurology consult for confusion/encephalopathy.  EEG no seizures. 9/21>> seizure-like activity. 9/22>> transferred to [facility] LTM EEG/neurology evaluation  Significant studies: 9/08>> CT angio chest: No PE/dissection-severe COPD, patchy infiltrates right lung. 9/08>> Echo: EF 35-40%, multiple wall motion abnormalities. 9/10>> Limited Echo: EF 35-40%, mid septal/apical LV segments are akinetic. 9/20>> CT head: Multiple age indeterminate infratentorial/bilateral supratentorial cortical hypodensities-worrisome for age-indeterminate infarcts. 9/20>> MRI brain: Limited by Motion artifact-but no evidence of acute/subacute infarct 9/20>> MRA brain/neck: Limited by motion artifact-but no significant stenosis observed. 9/20>> Spot EEG: No seizures 9/22-9/23>> LTM EEG: No seizures. 9/23>> LTM EEG: No seizures.  Significant microbiology data: 9/07>> COVID/influenza PCR: Negative 9/10>> tracheal aspirate: No growth 9/12>> urine culture: No growth 9/21>> blood culture: No growth  Procedures: 9/09-9/14>>  ETT  Consults: Neurology, pulmonology, neurology, palliative care  Subjective: Sleeping but easily arousable with loud verbal stimuli-able to tell me her name-able to squeeze my fingers with both hands.  With some redirection follow simple commands.  Objective: Vitals: Blood pressure (!) 120/58, pulse (!) 113, temperature 99.5 F (37.5 C), temperature source Axillary, resp. rate 19, height 5' (1.524 m), weight 49 kg, SpO2 100 %.   Exam: Gen Exam:not in any distress HEENT:atraumatic, normocephalic Chest: B/L clear to auscultation anteriorly CVS:S1S2 regular Abdomen:soft non tender, non distended Extremities:no edema Neurology: Non focal-but with generalized weakness. Skin: no rash   Pertinent Labs/Radiology:    Latest Ref Rng & Units 06/13/2022    3:45 AM 06/12/2022    2:43 AM 06/11/2022    6:24 AM  CBC  WBC 4.0 - 10.5 K/uL 10.5  15.2  13.5   Hemoglobin 12.0 - 15.0 g/dL 9.9  12.2  9.2   Hematocrit 36.0 - 46.0 % 30.4  37.1  29.3   Platelets 150 - 400 K/uL 88  86  87     Lab Results  Component Value Date   NA 138 06/13/2022   K 3.0 (L) 06/13/2022   CL 99 06/13/2022   CO2 29 06/13/2022      Assessment/Plan: Acute hypoxic/hypercarbic respiratory failure due to Mutlifocal PNA and COPD exacerbation.  Extubated on 9/14-currently stable on just 2 L of oxygen.  Has completed a course of antibiotics-continue bronchodilators.  Acute metabolic encephalopathy: Likely due to ICU/hospital delirium-possible postictal state.  Seems to be slowly improving.  Neuroimaging negative for structural abnormalities-EEG without seizures.  Neurology following.   ?Generalized tonic-clonic seizure: LTM EEG negative-not on any AEDs-neurology not convinced that patient actually had seizure-like activity at [facility].  Await further recommendations.    Elevated troponins due to demand  ischemia: No obvious anginal symptoms-cardiology following-plans are for eventual LHC once she is further stabilized.   Continue antiplatelets/statins and metoprolol.    HFrEF-stress-induced/Takotsubo cardiomyopathy: Volume status stable-cardiology plans for eventual LHC rule out ischemic etiology when she is stable.  Continue metoprolol, losartan/Aldactone-use Lasix as needed.  Cardiology notified of patient's transfer to [place] on 9/22.  History of CAD-/PCI 1997, CTO of RCA 2004: Continue beta-blocker/statin/Plavix.  History of CVA with residual right eye blindness: Moving all 4 extremities-continue antiplatelet/statin.  HTN: BP stable-continue bisoprolol/losartan-follow and optimize.  CKD stage IIIa: At baseline.  Hypokalemia: Replete and recheck.  Normocytic anemia: Due to critical illness/CKD-no evidence of blood loss-follow periodically.    Thrombocytopenia: Unclear etiology-mild-seems to have stabilized-watch for now.  GERD: Continue PPI  Oropharyngeal dysphagia: Plans were to place NG tube but since mentation improving-this was put on hold-patient tolerating in stock pures and medications.  I have asked RN to see if we can get SLP reevaluation today.  If her oral intake remains poor-she remains encephalopathic-we could potentially reconsider in place NG tube (Cortrak only placed Tues/Thurs)  Debility/deconditioning: PT/OT eval.  Nutrition Status: Nutrition Problem: Severe Malnutrition Etiology: chronic illness Signs/Symptoms: severe muscle depletion, severe fat depletion, percent weight loss Percent weight loss: 16 % Interventions: Refer to RD note for recommendations   Pressure Ulcer: Pressure Injury 06/06/22 Sacrum Medial Stage 2 -  Partial thickness loss of dermis presenting as a shallow open injury with a red, pink wound bed without slough. (Active)  06/06/22 2159  Location: Sacrum  Location Orientation: Medial  Staging: Stage 2 -  Partial thickness loss of dermis presenting as a shallow open injury with a red, pink wound bed without slough.  Wound Description (Comments):   Present on  Admission: No  Dressing Type Foam - Lift dressing to assess site every shift 06/12/22 2000    BMI: Estimated body mass index is 21.1 kg/m as calculated from the following:   Height as of this encounter: 5' (1.524 m).   Weight as of this encounter: 49 kg.   Code status:   Code Status: Full Code   DVT Prophylaxis: SCDs for now-on aspirin/Plavix-platelet count downtrending.  Reassess over the next few days.   Family Communication: Son [phone]-updated over the phone on 9/23.  I have asked him to update rest of the family members.  Disposition Plan: Status is: Inpatient Remains inpatient appropriate because: Possible new onset generalized tonic-clonic seizures-encephalopathic-LTM EEG in progress-neurology evaluation in progress-not stable for discharge.   Planned Discharge Destination:Skilled nursing facility   Diet: Diet Order             Diet NPO time specified Except for: Other (See Comments)  Diet effective now                     Antimicrobial agents: Anti-infectives (From admission, onward)    Start     Dose/Rate Route Frequency Ordered Stop   06/06/22 1400  aztreonam (AZACTAM) 1 g in sodium chloride 0.9 % 100 mL IVPB        1 g 200 mL/hr over 30 Minutes Intravenous Every 8 hours 06/06/22 0933 06/08/22 1405   06/06/22 1400  vancomycin (VANCOREADY) IVPB 750 mg/150 mL        750 mg 150 mL/hr over 60 Minutes Intravenous Every 24 hours 06/06/22 0933 06/07/22 1627   05/30/22 1400  vancomycin (VANCOREADY) IVPB 750 mg/150 mL  Status:  Discontinued        750 mg 150 mL/hr over 60  Minutes Intravenous Every 24 hours 05/29/22 1324 06/06/22 0933   05/29/22 1415  vancomycin (VANCOCIN) IVPB 1000 mg/200 mL premix        1,000 mg 200 mL/hr over 60 Minutes Intravenous  Once 05/29/22 1322 05/29/22 1451   05/29/22 1400  aztreonam (AZACTAM) 1 g in sodium chloride 0.9 % 100 mL IVPB  Status:  Discontinued        1 g 200 mL/hr over 30 Minutes Intravenous Every 8 hours  05/29/22 1311 06/06/22 0933   05/29/22 1400  azithromycin (ZITHROMAX) 500 mg in sodium chloride 0.9 % 250 mL IVPB  Status:  Discontinued        500 mg 250 mL/hr over 60 Minutes Intravenous Every 24 hours 05/29/22 1311 06/06/22 0932        MEDICATIONS: Scheduled Meds:  arformoterol  15 mcg Nebulization BID   aspirin  300 mg Rectal Daily   budesonide (PULMICORT) nebulizer solution  0.5 mg Nebulization BID   Chlorhexidine Gluconate Cloth  6 each Topical Q0600   [START ON 06/14/2022] citalopram  20 mg Oral Daily   [START ON 06/14/2022] clopidogrel  75 mg Oral Daily   feeding supplement  237 mL Oral TID BM   insulin aspart  0-9 Units Subcutaneous Q4H   LORazepam  1 mg Intravenous Once   [START ON 06/14/2022] losartan  12.5 mg Oral Daily   metoprolol tartrate  5 mg Intravenous Q8H   mupirocin ointment   Nasal BID   pantoprazole (PROTONIX) IV  40 mg Intravenous Q12H   potassium chloride  40 mEq Oral Once   revefenacin  175 mcg Nebulization Daily   [START ON 06/14/2022] rosuvastatin  20 mg Oral QPM   sodium chloride flush  10-40 mL Intracatheter Q12H   [START ON 06/14/2022] spironolactone  12.5 mg Oral Daily   Continuous Infusions:  sodium chloride Stopped (06/06/22 2139)   dextrose 5 % and 0.45% NaCl 50 mL/hr at 06/13/22 0930   PRN Meds:.sodium chloride, acetaminophen **OR** acetaminophen, albuterol, diphenoxylate-atropine, haloperidol lactate, hydrALAZINE, LORazepam, metoprolol tartrate, nitroGLYCERIN, ondansetron **OR** ondansetron (ZOFRAN) IV, mouth rinse, polyethylene glycol, polyvinyl alcohol, sodium chloride flush   I have personally reviewed following labs and imaging studies  LABORATORY DATA: CBC: Recent Labs  Lab 06/09/22 0406 06/10/22 0414 06/11/22 0624 06/12/22 0243 06/13/22 0345  WBC 14.5* 20.5* 13.5* 15.2* 10.5  NEUTROABS  --   --  11.1* 12.7* 8.5*  HGB 11.3* 11.4* 9.2* 12.2 9.9*  HCT 35.4* 37.7 29.3* 37.1 30.4*  MCV 109.9* 117.4* 111.4* 107.5* 108.2*  PLT 143*  127* 87* 86* 88*     Basic Metabolic Panel: Recent Labs  Lab 06/07/22 0603 06/08/22 0921 06/09/22 0406 06/10/22 0414 06/11/22 0624 06/12/22 0243 06/13/22 0345  NA 146*   < > 148* 148* 136 140 138  K 3.3*   < > 3.2* 3.4* 4.3 3.9 3.0*  CL 112*   < > 112* 108 99 100 99  CO2 26   < > 30 30 31 29 29   GLUCOSE 97   < > 98 121* 334* 113* 221*  BUN 22   < > 17 17 10 9  6*  CREATININE 0.82   < > 0.79 1.12* 0.88 0.92 0.82  CALCIUM 8.8*   < > 9.0 9.1 7.7* 8.8* 8.7*  MG 2.0  --  1.7 2.0  --   --   --   PHOS  --   --   --   --  2.4*  --   --    < > =  values in this interval not displayed.     GFR: Estimated Creatinine Clearance: 44.5 mL/min (by C-G formula based on SCr of 0.82 mg/dL).  Liver Function Tests: Recent Labs  Lab 06/10/22 0414 06/11/22 0624 06/12/22 0243 06/13/22 0345  AST 39 34 38 32  ALT 99* 70* 69* 52*  ALKPHOS 60 49 60 54  BILITOT 1.2 1.2 1.2 1.3*  PROT 5.4* 4.6* 5.4* 4.8*  ALBUMIN 2.9* 2.3* 2.6* 2.3*    No results for input(s): "LIPASE", "AMYLASE" in the last 168 hours. Recent Labs  Lab 06/10/22 1924  AMMONIA 27     Coagulation Profile: No results for input(s): "INR", "PROTIME" in the last 168 hours.  Cardiac Enzymes: Recent Labs  Lab 06/11/22 0816  CKTOTAL 78     BNP (last 3 results) No results for input(s): "PROBNP" in the last 8760 hours.  Lipid Profile: No results for input(s): "CHOL", "HDL", "LDLCALC", "TRIG", "CHOLHDL", "LDLDIRECT" in the last 72 hours.  Thyroid Function Tests: No results for input(s): "TSH", "T4TOTAL", "FREET4", "T3FREE", "THYROIDAB" in the last 72 hours.  Anemia Panel: Recent Labs    06/11/22 0908 06/12/22 0620  VITAMINB12  --  1,258*  FOLATE 19.4  --      Urine analysis:    Component Value Date/Time   COLORURINE YELLOW 06/11/2022 0624   APPEARANCEUR CLEAR 06/11/2022 0624   LABSPEC 1.015 06/11/2022 0624   PHURINE 6.0 06/11/2022 0624   GLUCOSEU NEGATIVE 06/11/2022 0624   HGBUR NEGATIVE 06/11/2022 0624    BILIRUBINUR NEGATIVE 06/11/2022 0624   KETONESUR NEGATIVE 06/11/2022 0624   PROTEINUR 30 (A) 06/11/2022 0624   UROBILINOGEN 1.0 07/16/2014 0244   NITRITE NEGATIVE 06/11/2022 0624   LEUKOCYTESUR NEGATIVE 06/11/2022 0624    Sepsis Labs: Lactic Acid, Venous No results found for: "LATICACIDVEN"  MICROBIOLOGY: Recent Results (from the past 240 hour(s))  Culture, blood (Routine X 2) w Reflex to ID Panel     Status: None (Preliminary result)   Collection Time: 06/11/22  5:09 PM   Specimen: Right Antecubital; Blood  Result Value Ref Range Status   Specimen Description RIGHT ANTECUBITAL  Final   Special Requests   Final    BOTTLES DRAWN AEROBIC AND ANAEROBIC Blood Culture adequate volume   Culture   Final    NO GROWTH 2 DAYS Performed at [facility], [address], [place], [place] [address]    Report Status PENDING  Incomplete  Culture, blood (Routine X 2) w Reflex to ID Panel     Status: None (Preliminary result)   Collection Time: 06/11/22  5:14 PM   Specimen: BLOOD  Result Value Ref Range Status   Specimen Description BLOOD  Final   Special Requests BLOOD  Final   Culture   Final    NO GROWTH 2 DAYS Performed at [facility], [address], [place], [place] [address]    Report Status PENDING  Incomplete    RADIOLOGY STUDIES/RESULTS: Overnight EEG with video  Result Date: 06/12/2022 Lora Havens, MD     06/13/2022  8:13 AM Patient Name: STEPHANINE BEK MRN: [id number] Epilepsy Attending: Lora Havens Referring Physician/Provider: Amie Portland, MD Duration: 06/12/2022 0448 to 06/13/2022 0448  Patient history: 71yo F with seizure like activity. EEG to evaluate for seizure  Level of alertness: lethargic , asleep  AEDs during EEG study: None  Technical aspects: This EEG study was done with scalp electrodes positioned according to the 10-20 International system of electrode placement. Electrical activity was reviewed with band pass filter  of 1-70Hz , sensitivity of 7 uV/mm,  display speed of 21mm/sec with a 60Hz  notched filter applied as appropriate. EEG data were recorded continuously and digitally stored.  Video monitoring was available and reviewed as appropriate.  Description: No posterior dominant rhythm was seen. Sleep was characterized by vertex waves, sleep spindles (12 to 14hz ), maximal frontocentral region. EEG showed continuous generalized polymorphic 3 to 6 Hz theta-delta slowing. Generalized periodic discharges with triphasic morphology at 1-1.5 Hz were also noted, more prominent when awake/stimulated. Hyperventilation and photic stimulation were not performed.    ABNORMALITY - Periodic discharges with triphasic morphology, generalized ( GPDs) - Continuous slow, generalized  IMPRESSION: This study showed generalized periodic discharges with triphasic morphology which can be on the ictal-interictal continuum. However, the morphology, frequency and reactivity to stimulation is more commonly indicative of toxic-metabolic causes. Additionally there was  moderate diffuse encephalopathy, nonspecific etiology. No seizures were seen throughout the recording.  Lora Havens   DG CHEST PORT 1 VIEW  Result Date: 06/12/2022 CLINICAL DATA:  Shortness of breath EXAM: PORTABLE CHEST 1 VIEW COMPARISON:  06/11/2022 FINDINGS: Similar hyperinflation suggesting background COPD/emphysema. Stable heart size and vascular congestion. Similar small posterior layering right effusion and right base atelectasis pattern. Stable left lung aeration. Trachea midline. Aorta atherosclerotic. Skin fold overlies the right chest. 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 Electronically Signed   By: Jerilynn Mages.  Shick M.D.   On: 06/12/2022 06:57     LOS: 16 days   Oren Binet, MD  [facility]    To contact the attending provider between 7A-7P or the covering provider during after hours 7P-7A, please log  into the web site [email or web address] and access using universal [place] password for that web site. If you do not have the password, please call the hospital operator.  06/13/2022, 10:43 AM

## 2022-06-13 NOTE — Procedures (Signed)
Patient Name: [name]  MRN: [id number]  Epilepsy Attending: Lora Havens  Referring Physician/Provider: Amie Portland, MD  Duration: 06/13/2022 0448 to 06/13/2022 9798   Patient history: 71yo F with seizure like activity. EEG to evaluate for seizure   Level of alertness: lethargic , asleep   AEDs during EEG study: None   Technical aspects: This EEG study was done with scalp electrodes positioned according to the 10-20 International system of electrode placement. Electrical activity was reviewed with band pass filter of 1-70Hz , sensitivity of 7 uV/mm, display speed of 27mm/sec with a 60Hz  notched filter applied as appropriate. EEG data were recorded continuously and digitally stored.  Video monitoring was available and reviewed as appropriate.   Description:  he posterior dominant rhythm consists of 90 Hz activity of moderate voltage (25-35 uV) seen predominantly in posterior head regions, symmetric and reactive to eye opening and eye closing. Sleep was characterized by vertex waves, sleep spindles (12 to 14hz ), maximal frontocentral region. EEG showed intermittent generalized 3 to 6 Hz theta-delta slowing, at times with triphasic morphology. Hyperventilation and photic stimulation were not performed.      ABNORMALITY - Intermittent slow, generalized   IMPRESSION: This study is suggestive of mild to moderate diffuse encephalopathy, nonspecific etiology but likely secondary to toxic-metabolic causes. No seizures or epileptiform discharges were seen throughout the recording.   Dorma Altman Barbra Sarks

## 2022-06-13 NOTE — Progress Notes (Signed)
PT Cancellation Note  Patient Details Name: [name] MRN: [id number] DOB: 06/20/1951   Cancelled Treatment:    Reason Eval/Treat Not Completed: (P) Other (comment) (Pt eating lunch on arrival.  Pt educated on the benefits and that PT would not be back today if she declines due to our schedule with +2 assistance.  Pt continues to decline.)   [name] 06/13/2022, 2:21 PM  Erasmo Leventhal , PTA Acute Rehabilitation Services Office [phone]

## 2022-06-13 NOTE — Progress Notes (Signed)
LTM EEG discontinued - no skin breakdown at unhook.   

## 2022-06-13 NOTE — Progress Notes (Signed)
LTM maint complete - no skin breakdown under: Fp2 F4 Fp1 Service Cz P8 C4  [facility] monitored, Event button test confirmed by [facility].

## 2022-06-13 NOTE — Progress Notes (Signed)
Speech Language Pathology Treatment: Dysphagia  Patient Details Name: [name] MRN: [id number] DOB: [birth year] Today's Date: 06/13/2022 Time: 6283-6629 SLP Time Calculation (min) (ACUTE ONLY): 24 min  Assessment / Plan / Recommendation Clinical Impression  Pt is a bit more alert and interactive today. She was able to sustain attention during PO trials, demonstrated adequate bolus awareness and no oral holding as documented yesterday. She had difficulty manipulating chopped solids, requiring manual removal from her mouth after ongoing but unsuccessful efforts to chew pears.  There were no s/s of aspiration when drinking thin liquids and she was able to handle pureed solids without difficulty. She will need full supervision and assistance with feeding at this time. Recommend starting a dysphagia 1 diet/thin liquids; crush meds in puree.  SLP will follow.    HPI HPI: Pt is [name] on 05/28/22 with SOB secondary to COPD exacerbation. Pt was initially placed on BiPAP, however she continued to worsen and required intubation. ETT 9/9-9/14. No [place] documented. Hospital course complicated by new onset HFrEF, acute metabolic encephalopathy and seizures. Pt transferred to [facility] on 9/22 for further workup. MRI 9/20 negative for acute changes. EEG 9/22: negative for siezures, generalized periodic discharges with triphasic morphology thought to be indicative of toxic-metabolic causes. Moderate diffuse encephalopathy. 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 Pt followed for swallowing 05/04/22-05/05/22 with recommendation for dysphagia 2 solids with thin liquids, and GI consult due to question of esphageal component. EGD 8/18: Two benign-appearing, intrinsic mild stenoses were found in the distal  esophagus and dilation performed; advance diet as tolerated.      SLP Plan  Continue with current plan of care      Recommendations for follow up therapy are one component of a multi-disciplinary discharge planning process, led by the attending physician.  Recommendations may be updated based on patient status, additional functional criteria and insurance authorization.    Recommendations  Diet recommendations: Dysphagia 1 (puree);Thin liquid Liquids provided via: Cup;Straw Medication Administration: Crushed with puree Supervision: Full supervision/cueing for compensatory strategies Compensations: Slow rate;Small sips/bites Postural Changes and/or Swallow Maneuvers: Seated upright 90 degrees;Upright 30-60 min after meal                Oral Care Recommendations: Oral care BID Follow Up Recommendations: Other (comment) (tba) Assistance recommended at discharge: Frequent or constant Supervision/Assistance SLP Visit Diagnosis: Dysphagia, unspecified (R13.10) Plan: Continue with current plan of care         April Jaquith L. Tivis Ringer, MA CCC/SLP Clinical Specialist - Acute Care SLP Acute Rehabilitation Services Office number [phone]   [name]  06/13/2022, 11:19 AM

## 2022-06-13 NOTE — Progress Notes (Signed)
Mobility Specialist Progress Note   06/13/22 1141  Mobility  Activity Dangled on edge of bed  Level of Assistance Total care  Assistive Device  (HHA)  Activity Response Tolerated well  $Mobility charge 1 Mobility   Pre Mobility: 103 HR, 127/65 BP, 98% SpO2 During Mobility: 122 HR, 94% SpO2 Post Mobility: 105 HR, 127/65 BP, 100% SpO2  Received in bed having no complaints and agreeable to mobility. Pt presenting w/ general weakness and limited mobility in extremities requiring MaxA to get EOB. Once sitting EOB constant support from back side was needed to keep pt upright d/t limited core strength. Sat EOB for ~10 mins and returned back supine w/o fault. Call bell in reach and bed alarm on.   Holland Falling Mobility Specialist MS [facility] #:  [phone] Acute Rehab Office:  [phone]

## 2022-06-14 DIAGNOSIS — J9602 Acute respiratory failure with hypercapnia: Secondary | ICD-10-CM | POA: Diagnosis not present

## 2022-06-14 DIAGNOSIS — J9601 Acute respiratory failure with hypoxia: Secondary | ICD-10-CM | POA: Diagnosis not present

## 2022-06-14 LAB — CBC WITH DIFFERENTIAL/PLATELET
Abs Immature Granulocytes: 0.03 10*3/uL (ref 0.00–0.07)
Basophils Absolute: 0 10*3/uL (ref 0.0–0.1)
Basophils Relative: 0 %
Eosinophils Absolute: 0 10*3/uL (ref 0.0–0.5)
Eosinophils Relative: 0 %
HCT: 28.5 % — ABNORMAL LOW (ref 36.0–46.0)
Hemoglobin: 9.3 g/dL — ABNORMAL LOW (ref 12.0–15.0)
Immature Granulocytes: 0 %
Lymphocytes Relative: 12 %
Lymphs Abs: 1 10*3/uL (ref 0.7–4.0)
MCH: 35 pg — ABNORMAL HIGH (ref 26.0–34.0)
MCHC: 32.6 g/dL (ref 30.0–36.0)
MCV: 107.1 fL — ABNORMAL HIGH (ref 80.0–100.0)
Monocytes Absolute: 0.9 10*3/uL (ref 0.1–1.0)
Monocytes Relative: 11 %
Neutro Abs: 6.4 10*3/uL (ref 1.7–7.7)
Neutrophils Relative %: 77 %
Platelets: 89 10*3/uL — ABNORMAL LOW (ref 150–400)
RBC: 2.66 MIL/uL — ABNORMAL LOW (ref 3.87–5.11)
RDW: 15.2 % (ref 11.5–15.5)
WBC: 8.5 10*3/uL (ref 4.0–10.5)
nRBC: 0 % (ref 0.0–0.2)

## 2022-06-14 LAB — COMPREHENSIVE METABOLIC PANEL
ALT: 43 U/L (ref 0–44)
AST: 31 U/L (ref 15–41)
Albumin: 2.2 g/dL — ABNORMAL LOW (ref 3.5–5.0)
Alkaline Phosphatase: 51 U/L (ref 38–126)
Anion gap: 7 (ref 5–15)
BUN: 7 mg/dL — ABNORMAL LOW (ref 8–23)
CO2: 29 mmol/L (ref 22–32)
Calcium: 8.5 mg/dL — ABNORMAL LOW (ref 8.9–10.3)
Chloride: 102 mmol/L (ref 98–111)
Creatinine, Ser: 0.88 mg/dL (ref 0.44–1.00)
GFR, Estimated: >60 mL/min (ref >60)
Glucose, Bld: 124 mg/dL — ABNORMAL HIGH (ref 70–99)
Potassium: 3.2 mmol/L — ABNORMAL LOW (ref 3.5–5.1)
Sodium: 138 mmol/L (ref 135–145)
Total Bilirubin: 1.2 mg/dL (ref 0.3–1.2)
Total Protein: 4.8 g/dL — ABNORMAL LOW (ref 6.5–8.1)

## 2022-06-14 LAB — GLUCOSE, CAPILLARY
Glucose-Capillary: 119 mg/dL — ABNORMAL HIGH (ref 70–99)
Glucose-Capillary: 116 mg/dL — ABNORMAL HIGH (ref 70–99)
Glucose-Capillary: 196 mg/dL — ABNORMAL HIGH (ref 70–99)
Glucose-Capillary: 137 mg/dL — ABNORMAL HIGH (ref 70–99)

## 2022-06-14 LAB — MAGNESIUM: Magnesium: 1.6 mg/dL — ABNORMAL LOW (ref 1.7–2.4)

## 2022-06-14 MED ORDER — POTASSIUM CHLORIDE CRYS ER 20 MEQ PO TBCR
20.0000 meq | EXTENDED_RELEASE_TABLET | Freq: Once | ORAL | Status: AC
  Administered 2022-06-14: 20 meq via ORAL
  Filled 2022-06-14: qty 1

## 2022-06-14 MED ORDER — POTASSIUM CHLORIDE CRYS ER 20 MEQ PO TBCR
40.0000 meq | EXTENDED_RELEASE_TABLET | Freq: Once | ORAL | Status: AC
  Administered 2022-06-14: 40 meq via ORAL
  Filled 2022-06-14: qty 2

## 2022-06-14 MED ORDER — ASPIRIN 81 MG PO CHEW
81.0000 mg | CHEWABLE_TABLET | Freq: Every day | ORAL | Status: DC
  Administered 2022-06-14 – 2022-06-17 (×4): 81 mg via ORAL
  Filled 2022-06-14 (×4): qty 1

## 2022-06-14 MED ORDER — MAGNESIUM SULFATE 4 GM/100ML IV SOLN
4.0000 g | Freq: Once | INTRAVENOUS | Status: AC
  Administered 2022-06-14: 4 g via INTRAVENOUS
  Filled 2022-06-14: qty 100

## 2022-06-14 NOTE — Progress Notes (Signed)
Physical Therapy Treatment Patient Details Name: [name] MRN: [id number] DOB: 10/18/1950 Today's Date: 06/14/2022   History of Present Illness Pt is a 71 y/o female presentsing to  [facility] on 9/7 with SOB due to COPD exacerbation. Placed on bipap initally, progressing to intubation with ETT 9/9-9/14. Course complicated by new onset HFrEF, acute metabolic encephalopathy and seizures.  Transferred to [facility] on 9/22.  MRI negative, EEG 9/22 negative for seizures, moderate diffuse encephalopathy. PMH includes: HTN, CVA, CAD, blind R eye, depression, psychosis.    PT Comments    Patient with improved alertness this session and more conversive. Required maxA+2 for bed mobility this date which is improved from previous session. Able to progress to standing x2 with maxA+2 and RW initially but better with HHAx2. Patient demos cognitive deficits in processing, attention, awareness, and sequencing. Increased time required to follow commands. Continue to recommend SNF for ongoing Physical Therapy.       Recommendations for follow up therapy are one component of a multi-disciplinary discharge planning process, led by the attending physician.  Recommendations may be updated based on patient status, additional functional criteria and insurance authorization.  Follow Up Recommendations  Skilled nursing-short term rehab (<3 hours/day) Can patient physically be transported by private vehicle: No   Assistance Recommended at Discharge Intermittent Supervision/Assistance  Patient can return home with the following Help with stairs or ramp for entrance;Assistance with cooking/housework;Two people to help with walking and/or transfers;Two people to help with bathing/dressing/bathroom;Direct supervision/assist for medications management;Direct supervision/assist for financial management;Assist for transportation   Equipment Recommendations  None recommended by PT    Recommendations for Other Services        Precautions / Restrictions Precautions Precautions: Fall Restrictions Weight Bearing Restrictions: No     Mobility  Bed Mobility Overal bed mobility: Needs Assistance Bed Mobility: Supine to Sit, Sit to Supine     Supine to sit: Max assist, +2 for physical assistance, +2 for safety/equipment Sit to supine: Max assist, +2 for physical assistance, +2 for safety/equipment   General bed mobility comments: increased time to complete. Assist required for LE management but patient able to assist with trunk elevation with use of bed rail. On return to supine, patient able to control trunk to bed but assist required for LE management    Transfers Overall transfer level: Needs assistance Equipment used: Rolling [name] (2 wheels), 2 person hand held assist Transfers: Sit to/from Stand Sit to Stand: Max assist, +2 safety/equipment, +2 physical assistance           General transfer comment: assist to power up and steady. L knee flexed and hip adducted in standing with inability to actively correct. Initially stood with RW but unable to maintain grip so on second stand used HHAx2    Ambulation/Gait                   Stairs             Wheelchair Mobility    Modified Rankin (Stroke Patients Only)       Balance Overall balance assessment: Needs assistance Sitting-balance support: Feet supported, No upper extremity supported Sitting balance-Leahy Scale: Poor Sitting balance - Comments: at least minA to maintain sitting balance with L lateral lean Postural control: Left lateral lean Standing balance support: Bilateral upper extremity supported, Reliant on assistive device for balance Standing balance-Leahy Scale: Zero  Cognition Arousal/Alertness: Awake/alert Behavior During Therapy: Flat affect Overall Cognitive Status: Impaired/Different from baseline Area of Impairment: Attention, Following commands, Safety/judgement,  Awareness                   Current Attention Level: Sustained   Following Commands: Follows one step commands inconsistently, Follows one step commands with increased time Safety/Judgement: Decreased awareness of safety, Decreased awareness of deficits Awareness: Intellectual   General Comments: poor awareness into deficits. Following commands with increased time but intermittently. More alert and conversive with therapist this session        Exercises      General Comments        Pertinent Vitals/Pain Pain Assessment Pain Assessment: Faces Faces Pain Scale: Hurts little more Pain Location: L LE with movement Pain Descriptors / Indicators: Discomfort, Grimacing Pain Intervention(s): Monitored during session, Repositioned    Home Living                          Prior Function            PT Goals (current goals can now be found in the care plan section) Acute Rehab PT Goals PT Goal Formulation: With patient Time For Goal Achievement: 07/06/22 Potential to Achieve Goals: Good Progress towards PT goals: Progressing toward goals    Frequency    Min 3X/week      PT Plan Current plan remains appropriate    Co-evaluation              AM-PAC PT "6 Clicks" Mobility   Outcome Measure  Help needed turning from your back to your side while in a flat bed without using bedrails?: Total Help needed moving from lying on your back to sitting on the side of a flat bed without using bedrails?: Total Help needed moving to and from a bed to a chair (including a wheelchair)?: Total Help needed standing up from a chair using your arms (e.g., wheelchair or bedside chair)?: Total Help needed to walk in hospital room?: Total Help needed climbing 3-5 steps with a railing? : Total 6 Click Score: 6    End of Session Equipment Utilized During Treatment: Gait belt Activity Tolerance: Patient tolerated treatment well Patient left: in bed;with call bell/phone  within reach;with bed alarm set Nurse Communication: Mobility status PT Visit Diagnosis: Unsteadiness on feet (R26.81);Other abnormalities of gait and mobility (R26.89);Muscle weakness (generalized) (M62.81)     Time: [id number] PT Time Calculation (min) (ACUTE ONLY): 25 min  Charges:  $Therapeutic Activity: 23-37 mins                     Osiris Charles A. Gilford Rile PT, DPT Acute Rehabilitation Services Office [phone]    [name] 06/14/2022, 3:58 PM

## 2022-06-14 NOTE — Progress Notes (Signed)
PROGRESS NOTE        PATIENT DETAILS Name: [name] Age: 71 y.o. Sex: female Date of Birth: [birth year] Admit Date: 05/28/2022 Admitting Physician Ejiroghene Arlyce Dice, MD VHQ:IONGE, [name], MD  Brief Summary: Patient is a 71 y.o.  female with history of COPD, HTN, HLD, anxiety, prior CVA who was admitted to [facility] on 9/7 with shortness of breath due to COPD exacerbation-she was initially placed on BiPAP, however she continued to worsen and required intubation.  She was stabilized-extubated-further hospital course complicated by new onset HFrEF (possibly stress-induced), acute metabolic encephalopathy and seizures.  She was transferred to [facility] on 9/22 for LTM EEG and neurology evaluation.  Please see below for further details   Significant events: 9/07>> SOB-BiPAP-admit to [facility] at [facility]. 9/09>> intubated 9/14>> extubated 9/20>> telemetry neurology consult for confusion/encephalopathy.  EEG no seizures. 9/21>> seizure-like activity. 9/22>> transferred to [facility] LTM EEG/neurology evaluation  Significant studies: 9/08>> CT angio chest: No PE/dissection-severe COPD, patchy infiltrates right lung. 9/08>> Echo: EF 35-40%, multiple wall motion abnormalities. 9/10>> Limited Echo: EF 35-40%, mid septal/apical LV segments are akinetic. 9/20>> CT head: Multiple age indeterminate infratentorial/bilateral supratentorial cortical hypodensities-worrisome for age-indeterminate infarcts. 9/20>> MRI brain: Limited by Motion artifact-but no evidence of acute/subacute infarct 9/20>> MRA brain/neck: Limited by motion artifact-but no significant stenosis observed. 9/20>> Spot EEG: No seizures 9/22-9/23>> LTM EEG: No seizures. 9/23>> LTM EEG: No seizures.  Significant microbiology data: 9/07>> COVID/influenza PCR: Negative 9/10>> tracheal aspirate: No growth 9/12>> urine culture: No growth 9/21>> blood culture: No growth  Procedures: 9/09-9/14>>  ETT  Consults: Neurology, pulmonology, neurology, palliative care  Subjective:  Patient in bed, appears comfortable, denies any headache, no fever, no chest pain or pressure, no shortness of breath , no abdominal pain. No new focal weakness.   Objective: Vitals: Blood pressure (!) 149/73, pulse 96, temperature 97.7 F (36.5 C), temperature source Axillary, resp. rate 19, height 5' (1.524 m), weight 49 kg, SpO2 100 %.   Exam:  Awake Alert, No new F.N deficits, diffuse generalized weakness [place].AT,PERRAL Supple Neck, No JVD,   Symmetrical Chest wall movement, Good air movement bilaterally, CTAB RRR,No Gallops, Rubs or new Murmurs,  +ve B.Sounds, Abd Soft, No tenderness,   No Cyanosis, Clubbing or edema    Assessment/Plan: Acute hypoxic/hypercarbic respiratory failure due to Mutlifocal PNA and COPD exacerbation.  Extubated on 9/14-currently stable on just 2 L of oxygen.  Has completed a course of antibiotics-continue bronchodilators.  Acute metabolic encephalopathy: Likely due to ICU/hospital delirium-possible postictal state.  Seems to be slowly improving.  Neuroimaging negative for structural abnormalities-EEG without seizures.  Neurology following.   ?Generalized tonic-clonic seizure: LTM EEG negative-not on any AEDs-neurology not convinced that patient actually had seizure-like activity at [facility].  Await further recommendations.    Elevated troponins due to demand ischemia: No obvious anginal symptoms-cardiology following-plans are for eventual LHC once she is further stabilized.  Continue antiplatelets/statins and metoprolol.    HFrEF-stress-induced/Takotsubo cardiomyopathy: Volume status stable-cardiology plans for eventual LHC rule out ischemic etiology when she is stable.  Continue metoprolol, losartan/Aldactone-use Lasix as needed.  Cardiology notified of patient's transfer to [facility] on 9/22.  History of CAD-/PCI 1997, CTO of RCA 2004: Continue  beta-blocker/statin/Plavix.  History of CVA with residual right eye blindness: Moving all 4 extremities-continue antiplatelet/statin.  HTN: BP stable-continue bisoprolol/losartan-follow and optimize.  CKD stage IIIa: At baseline.  Hypokalemia and hypomagnesemia: Repleted  Normocytic anemia: Due to critical illness/CKD-no evidence of blood loss-follow periodically.    Thrombocytopenia: Unclear etiology-mild-seems to have stabilized-watch for now.  GERD: Continue PPI  Oropharyngeal dysphagia: Plans were to place NG tube but since mentation improving-this was put on hold-patient tolerating in stock pures and medications.  I have asked RN to see if we can get SLP reevaluation today.  If her oral intake remains poor-she remains encephalopathic-we could potentially reconsider in place NG tube (Cortrak only placed Tues/Thurs)  Debility/deconditioning: PT/OT eval.  Nutrition Status: Nutrition Problem: Severe Malnutrition Etiology: chronic illness Signs/Symptoms: severe muscle depletion, severe fat depletion, percent weight loss Percent weight loss: 16 % Interventions: Refer to RD note for recommendations   Pressure Ulcer: Pressure Injury 06/06/22 Sacrum Medial Stage 2 -  Partial thickness loss of dermis presenting as a shallow open injury with a red, pink wound bed without slough. (Active)  06/06/22 2159  Location: Sacrum  Location Orientation: Medial  Staging: Stage 2 -  Partial thickness loss of dermis presenting as a shallow open injury with a red, pink wound bed without slough.  Wound Description (Comments):   Present on Admission: No  Dressing Type Foam - Lift dressing to assess site every shift 06/12/22 2000    BMI: Estimated body mass index is 21.1 kg/m as calculated from the following:   Height as of this encounter: 5' (1.524 m).   Weight as of this encounter: 49 kg.   Code status:   Code Status: Full Code   DVT Prophylaxis: SCDs for now-on aspirin/Plavix-platelet  count downtrending.  Reassess over the next few days.   Family Communication: Son [phone]-updated over the phone on 9/23.  I have asked him to update rest of the family members.  Disposition Plan: Status is: Inpatient Remains inpatient appropriate because: Possible new onset generalized tonic-clonic seizures-encephalopathic-LTM EEG in progress-neurology evaluation in progress-not stable for discharge.   Planned Discharge Destination:Skilled nursing facility   Diet: Diet Order             DIET - DYS 1 Room service appropriate? Yes with Assist; Fluid consistency: Thin  Diet effective now                    MEDICATIONS: Scheduled Meds:  arformoterol  15 mcg Nebulization BID   aspirin  81 mg Oral Daily   budesonide (PULMICORT) nebulizer solution  0.5 mg Nebulization BID   Chlorhexidine Gluconate Cloth  6 each Topical Q0600   citalopram  20 mg Oral Daily   clopidogrel  75 mg Oral Daily   feeding supplement  237 mL Oral TID BM   insulin aspart  0-9 Units Subcutaneous Q4H   LORazepam  1 mg Intravenous Once   losartan  12.5 mg Oral Daily   metoprolol tartrate  5 mg Intravenous Q8H   mupirocin ointment   Nasal BID   pantoprazole (PROTONIX) IV  40 mg Intravenous Q12H   potassium chloride  40 mEq Oral Once   revefenacin  175 mcg Nebulization Daily   rosuvastatin  20 mg Oral QPM   sodium chloride flush  10-40 mL Intracatheter Q12H   spironolactone  12.5 mg Oral Daily   Continuous Infusions:  sodium chloride Stopped (06/06/22 2139)   magnesium sulfate bolus IVPB     PRN Meds:.sodium chloride, acetaminophen **OR** acetaminophen, albuterol, diphenoxylate-atropine, haloperidol lactate, hydrALAZINE, LORazepam, metoprolol tartrate, nitroGLYCERIN, ondansetron **OR** ondansetron (ZOFRAN) IV, mouth rinse, polyethylene glycol, polyvinyl alcohol, sodium chloride flush   I have  personally reviewed following labs and imaging studies  LABORATORY DATA:  Recent Labs  Lab  06/10/22 0414 06/11/22 0624 06/12/22 0243 06/13/22 0345 06/14/22 0600  WBC 20.5* 13.5* 15.2* 10.5 8.5  HGB 11.4* 9.2* 12.2 9.9* 9.3*  HCT 37.7 29.3* 37.1 30.4* 28.5*  PLT 127* 87* 86* 88* 89*  MCV 117.4* 111.4* 107.5* 108.2* 107.1*  MCH 35.5* 35.0* 35.4* 35.2* 35.0*  MCHC 30.2 31.4 32.9 32.6 32.6  RDW 16.0* 15.8* 15.3 14.9 15.2  LYMPHSABS  --  1.3 1.3 1.1 1.0  MONOABS  --  1.0 1.0 0.9 0.9  EOSABS  --  0.0 0.0 0.0 0.0  BASOSABS  --  0.0 0.0 0.0 0.0    Recent Labs  Lab 06/09/22 0406 06/10/22 0414 06/10/22 1924 06/11/22 0624 06/12/22 0243 06/13/22 0345 06/14/22 0600  NA 148* 148*  --  136 140 138 138  K 3.2* 3.4*  --  4.3 3.9 3.0* 3.2*  CL 112* 108  --  99 100 99 102  CO2 30 30  --  31 29 29 29   GLUCOSE 98 121*  --  334* 113* 221* 124*  BUN 17 17  --  10 9 6* 7*  CREATININE 0.79 1.12*  --  0.88 0.92 0.82 0.88  CALCIUM 9.0 9.1  --  7.7* 8.8* 8.7* 8.5*  AST  --  39  --  34 38 32 31  ALT  --  99*  --  70* 69* 52* 43  ALKPHOS  --  60  --  49 60 54 51  BILITOT  --  1.2  --  1.2 1.2 1.3* 1.2  ALBUMIN  --  2.9*  --  2.3* 2.6* 2.3* 2.2*  MG 1.7 2.0  --   --   --   --  1.6*  PHOS  --   --   --  2.4*  --   --   --   PROCALCITON  --   --   --  0.21  --   --   --   AMMONIA  --   --  27  --   --   --   --   BNP 1,803.0*  --   --   --   --   --   --      RADIOLOGY STUDIES/RESULTS: Overnight EEG with video  Result Date: 06/12/2022 Lora Havens, MD     06/13/2022  8:13 AM Patient Name: AYAKA CARRIER MRN: [id number] Epilepsy Attending: Lora Havens Referring Physician/Provider: Amie Portland, MD Duration: 06/12/2022 0448 to 06/13/2022 0448  Patient history: 71yo F with seizure like activity. EEG to evaluate for seizure  Level of alertness: lethargic , asleep  AEDs during EEG study: None  Technical aspects: This EEG study was done with scalp electrodes positioned according to the 10-20 International system of electrode placement. Electrical activity was reviewed with band  pass filter of 1-70Hz , sensitivity of 7 uV/mm, display speed of 59mm/sec with a 60Hz  notched filter applied as appropriate. EEG data were recorded continuously and digitally stored.  Video monitoring was available and reviewed as appropriate.  Description: No posterior dominant rhythm was seen. Sleep was characterized by vertex waves, sleep spindles (12 to 14hz ), maximal frontocentral region. EEG showed continuous generalized polymorphic 3 to 6 Hz theta-delta slowing. Generalized periodic discharges with triphasic morphology at 1-1.5 Hz were also noted, more prominent when awake/stimulated. Hyperventilation and photic stimulation were not performed.    ABNORMALITY - Periodic discharges with triphasic morphology,  generalized ( GPDs) - Continuous slow, generalized  IMPRESSION: This study showed generalized periodic discharges with triphasic morphology which can be on the ictal-interictal continuum. However, the morphology, frequency and reactivity to stimulation is more commonly indicative of toxic-metabolic causes. Additionally there was  moderate diffuse encephalopathy, nonspecific etiology. No seizures were seen throughout the recording.  Lora Havens     LOS: 17 days   Signature  Lala Lund M.D on 06/14/2022 at 10:09 AM   -  To page go to [email or web address]

## 2022-06-15 DIAGNOSIS — J9601 Acute respiratory failure with hypoxia: Secondary | ICD-10-CM | POA: Diagnosis not present

## 2022-06-15 DIAGNOSIS — J9602 Acute respiratory failure with hypercapnia: Secondary | ICD-10-CM | POA: Diagnosis not present

## 2022-06-15 LAB — MAGNESIUM: Magnesium: 2.1 mg/dL (ref 1.7–2.4)

## 2022-06-15 LAB — BASIC METABOLIC PANEL
Anion gap: 6 (ref 5–15)
BUN: 7 mg/dL — ABNORMAL LOW (ref 8–23)
CO2: 30 mmol/L (ref 22–32)
Calcium: 8.2 mg/dL — ABNORMAL LOW (ref 8.9–10.3)
Chloride: 103 mmol/L (ref 98–111)
Creatinine, Ser: 0.86 mg/dL (ref 0.44–1.00)
GFR, Estimated: >60 mL/min (ref >60)
Glucose, Bld: 104 mg/dL — ABNORMAL HIGH (ref 70–99)
Potassium: 3.2 mmol/L — ABNORMAL LOW (ref 3.5–5.1)
Sodium: 139 mmol/L (ref 135–145)

## 2022-06-15 LAB — GLUCOSE, CAPILLARY
Glucose-Capillary: 117 mg/dL — ABNORMAL HIGH (ref 70–99)
Glucose-Capillary: 123 mg/dL — ABNORMAL HIGH (ref 70–99)
Glucose-Capillary: 143 mg/dL — ABNORMAL HIGH (ref 70–99)
Glucose-Capillary: 80 mg/dL (ref 70–99)
Glucose-Capillary: 93 mg/dL (ref 70–99)
Glucose-Capillary: 98 mg/dL (ref 70–99)
Glucose-Capillary: 99 mg/dL (ref 70–99)

## 2022-06-15 MED ORDER — POTASSIUM CHLORIDE 20 MEQ PO PACK
40.0000 meq | PACK | Freq: Two times a day (BID) | ORAL | Status: AC
  Administered 2022-06-15 (×2): 40 meq via ORAL
  Filled 2022-06-15 (×2): qty 2

## 2022-06-15 NOTE — Progress Notes (Addendum)
Subjective: No acute events over night.  Denies any new concerns.  ROS: negative except above  Examination  Vital signs in last 24 hours: Temp:  [97.7 F (36.5 C)-99 F (37.2 C)] 98.6 F (37 C) (09/25 0359) Pulse Rate:  [92-110] 92 (09/25 0802) Resp:  [16-20] 18 (09/25 0802) BP: (134-148)/(64-67) 148/64 (09/25 0359) SpO2:  [98 %-100 %] 99 % (09/25 0802) FiO2 (%):  [28 %] 28 % (09/25 0802) Weight:  [50.5 kg] 50.5 kg (09/25 0500)  General: lying in bed, NAD Neuro: MS: Alert, oriented to place and person, month : August, no aphasia, able to do simple math CN: pupils equal and reactive,  EOMI, face symmetric, tongue midline, normal sensation over face Motor: 4/5 strength in all 4 extremities Coordination: normal Gait: not tested  Basic Metabolic Panel: Recent Labs  Lab 06/09/22 0406 06/10/22 0414 06/11/22 0624 06/12/22 0243 06/13/22 0345 06/14/22 0600 06/15/22 0250  NA 148* 148* 136 140 138 138 139  K 3.2* 3.4* 4.3 3.9 3.0* 3.2* 3.2*  CL 112* 108 99 100 99 102 103  CO2 30 30 31 29 29 29 30   GLUCOSE 98 121* 334* 113* 221* 124* 104*  BUN 17 17 10 9  6* 7* 7*  CREATININE 0.79 1.12* 0.88 0.92 0.82 0.88 0.86  CALCIUM 9.0 9.1 7.7* 8.8* 8.7* 8.5* 8.2*  MG 1.7 2.0  --   --   --  1.6* 2.1  PHOS  --   --  2.4*  --   --   --   --     CBC: Recent Labs  Lab 06/10/22 0414 06/11/22 0624 06/12/22 0243 06/13/22 0345 06/14/22 0600  WBC 20.5* 13.5* 15.2* 10.5 8.5  NEUTROABS  --  11.1* 12.7* 8.5* 6.4  HGB 11.4* 9.2* 12.2 9.9* 9.3*  HCT 37.7 29.3* 37.1 30.4* 28.5*  MCV 117.4* 111.4* 107.5* 108.2* 107.1*  PLT 127* 87* 86* 88* 89*     Coagulation Studies: No results for input(s): "LABPROT", "INR" in the last 72 hours.  Imaging No new brain imaging overnight   ASSESSMENT AND PLAN: 71yo F with ams and seizure like activity X2 at [facility]   Seizure like activity Acute encephalopathy, resolved - Semiology of the first episodes wasn't typical for epileptic  seizure. No documented h/o of prior seizures and son at bedside also denies previous history of seizures.  Even if these were seizures, could have been provoked seizures in the setting of pneumonia -Encephalopathy most likely due to delirium, medication use in an elderly female with minimal cognitive reserves   Recommendations: -MRI brain obtained for encephalopathy work-up showed restricted diffusion which was significantly limited due to motion artifact.  -Continue aspirin 81 mg daily as well as Plavix 75 mg daily for 3 months due to intracranial stenosis. -Continues rosuvastatin 20 mg daily -As EEG did not show any epileptiform abnormality, will hold off on starting any AEDs -Neurology follow-up in 3 months.  Will likely also benefit from dementia evaluation as an outpatient   I have spent a total of  25 minutes with the patient reviewing hospital notes,  test results, labs and examining the patient as well as establishing an assessment and plan.  > 50% of time was spent in direct patient care.   Zeb Comfort Epilepsy [facility] For questions after 5pm please refer to AMION to reach the Neurologist on call

## 2022-06-15 NOTE — Progress Notes (Signed)
Nutrition Follow-up  DOCUMENTATION CODES:  Severe malnutrition in context of chronic illness  INTERVENTION:  Advance diet as able per SLP, encourage PO intake Ensure Enlive po TID, each supplement provides 350 kcal and 20 grams of protein. Would recommend placement of NG tube and initiation of TF as pt is severely malnourished and has been with without adequate nutrition for most of admission  Recommend the following: Osmolite 1.2 at 55 ml/h would provide 1584 kcal, 73 gm protein, 1082 ml free water daily to meet 100% of nutrition needs.   NUTRITION DIAGNOSIS:  Severe Malnutrition related to chronic illness as evidenced by severe muscle depletion, severe fat depletion, percent weight loss. - ongoing  GOAL:   Patient will meet greater than or equal to 90% of their needs - no progressing, NPO  MONITOR:   Diet advancement, Weight trends, I & O's  REASON FOR ASSESSMENT:   Consult Enteral/tube feeding initiation and management  ASSESSMENT:   Patient is a 71 yo female with hx of CVA, blindness in right eye, GERD, CKD-3a, CAD, HLD, HTN. Recent COVID, AKI, colitis. Presents with shortness of breath and required BiPAP. COPD with acute exacerbation.  9/9 intubated 9/14 extubated  Diet advanced on Friday but pt continues to have poor intake of meals. Discussed with son outside of room about pt's continued poor intake. Discussed the tube that was ordered on Friday - which son states he was aware and still in favor of if it was needed. States that pt's intake is much reduced from her baseline and he hopes that she will continue to regain her functional status but in the mealtime understands she needs nutrition.   Reached out to provider as the cortrak order is still in place to confirm that placement was still desired. MD asked RD not to have this conversation with son and he would inform the RD team if TF was needed.   Nutritionally Relevant Medications: Scheduled Meds:  feeding  supplement  237 mL Oral TID BM   insulin aspart  0-9 Units Subcutaneous Q4H   pantoprazole IV  40 mg Intravenous Q12H   Continuous Infusions:  dextrose 5 % and 0.45% NaCl 100 mL/hr at 06/11/22 1852   Labs Reviewed  NUTRITION - FOCUSED PHYSICAL EXAM: Flowsheet Row Most Recent Value  Orbital Region Mild depletion  Upper Arm Region Severe depletion  Thoracic and Lumbar Region Moderate depletion  Buccal Region Mild depletion  Temple Region Mild depletion  Clavicle Bone Region Moderate depletion  Clavicle and Acromion Bone Region Severe depletion  Scapular Bone Region Unable to assess  Dorsal Hand Mild depletion  Patellar Region Severe depletion  Anterior Thigh Region Severe depletion  Posterior Calf Region Severe depletion  Edema (RD Assessment) None  Hair Reviewed  Eyes Reviewed  Mouth Reviewed  [no top teeth present]  Skin Reviewed  Nails Reviewed    Diet Order:   Diet Order             DIET - DYS 1 Room service appropriate? Yes with Assist; Fluid consistency: Thin  Diet effective now                   EDUCATION NEEDS:  Not appropriate for education at this time  Skin:  Skin Assessment: Skin Integrity Issues: Skin Integrity Issues:: Stage II Stage II: sacrum  Last BM:  9/16 type 6  Height:   Ht Readings from Last 1 Encounters:  05/31/22 5' (1.524 m)    Weight:   Wt Readings from  Last 1 Encounters:  06/15/22 50.5 kg    Ideal Body Weight:  45.5 kg  BMI:  Body mass index is 21.73 kg/m.  Estimated Nutritional Needs:  Kcal:  1500-1700 kcal/d Protein:  75-90 g/d Fluid:  >/=1.5L/d    Ranell Patrick, RD, LDN Clinical Dietitian RD pager # available in [facility]  After hours/weekend pager # available in [facility]

## 2022-06-15 NOTE — Progress Notes (Signed)
Physical Therapy Treatment Patient Details Name: [name] MRN: [id number] DOB: [birth year] Today's Date: 06/15/2022   History of Present Illness Pt is a 71 y/o female [facility]ing to  [facility] on [facility]7 with SOB due to COPD exacerbation. Placed on bipap initally, progressing to intubation with ETT 9/9-9/14. Course complicated by new onset HFrEF, acute metabolic encephalopathy and seizures.  Transferred to [facility] on 9/22.  MRI negative, EEG 9/22 negative for seizures, moderate diffuse encephalopathy. PMH includes: HTN, CVA, CAD, blind R eye, depression, psychosis.    PT Comments    Pt progressing slowly towards physical therapy goals.  Was able to perform transfers with up to +2 max assist and bed pad under hips for added support. Pt with short bouts of arousal, quickly falling back to sleep if not being engaged by therapist. Pt was motivated by ice chips and ice cream. NT notified at end of session that pt was ready for assist with lunch. Will continue to follow and progress as able per POC.    Recommendations for follow up therapy are one component of a multi-disciplinary discharge planning process, led by the attending physician.  Recommendations may be updated based on patient status, additional functional criteria and insurance authorization.  Follow Up Recommendations  Skilled nursing-short term rehab (<3 hours/day) Can patient physically be transported by private vehicle: No   Assistance Recommended at Discharge Intermittent Supervision/Assistance  Patient can return home with the following Help with stairs or ramp for entrance;Assistance with cooking/housework;Two people to help with walking and/or transfers;Two people to help with bathing/dressing/bathroom;Direct supervision/assist for medications management;Direct supervision/assist for financial management;Assist for transportation   Equipment Recommendations  None recommended by PT    Recommendations for Other Services        Precautions / Restrictions Precautions Precautions: Fall Restrictions Weight Bearing Restrictions: No     Mobility  Bed Mobility Overal bed mobility: Needs Assistance Bed Mobility: Supine to Sit, Sit to Supine Rolling: Total assist, +2 for physical assistance, +2 for safety/equipment   Supine to sit: Max assist, +2 for physical assistance, +2 for safety/equipment Sit to supine: Max assist, +2 for physical assistance, +2 for safety/equipment   General bed mobility comments: Poor initiation, requiring +2 assist for all aspects of bed mobility.    Transfers Overall transfer level: Needs assistance Equipment used: 2 person hand held assist Transfers: Sit to/from Stand Sit to Stand: Max assist, +2 safety/equipment, +2 physical assistance           General transfer comment: +2 HHA utilized for optimal balance support and safety. Grossly flexed trunk with minimal corrective changes for posture with cues. On second stand, pt attempted side steps up towards [facility] however unable to advance either foot.    Ambulation/Gait                   Stairs             Wheelchair Mobility    Modified Rankin (Stroke Patients Only)       Balance Overall balance assessment: Needs assistance Sitting-balance support: Feet supported, No upper extremity supported Sitting balance-Leahy Scale: Poor Sitting balance - Comments: at least minA to maintain sitting balance with L lateral lean Postural control: Left lateral lean Standing balance support: Bilateral upper extremity supported, Reliant on assistive device for balance Standing balance-Leahy Scale: Zero Standing balance comment: +2 required  Cognition Arousal/Alertness: Awake/alert Behavior During Therapy: Flat affect Overall Cognitive Status: Impaired/Different from baseline Area of Impairment: Attention, Following commands, Safety/judgement, Awareness                    Current Attention Level: Sustained   Following Commands: Follows one step commands inconsistently, Follows one step commands with increased time Safety/Judgement: Decreased awareness of safety, Decreased awareness of deficits Awareness: Intellectual   General Comments: poor awareness into deficits. Following commands with increased time but intermittently.        Exercises      General Comments        Pertinent Vitals/Pain Pain Assessment Pain Assessment: Faces Faces Pain Scale: Hurts little more Pain Location: LE's with movement Pain Descriptors / Indicators: Discomfort, Grimacing Pain Intervention(s): Limited activity within patient's tolerance, Monitored during session, Repositioned    Home Living                          Prior Function            PT Goals (current goals can now be found in the care plan section) Acute Rehab PT Goals Patient Stated Goal: Eat ice cream PT Goal Formulation: With patient Time For Goal Achievement: 07/06/22 Potential to Achieve Goals: Good Progress towards PT goals: Progressing toward goals    Frequency    Min 2X/week      PT Plan Current plan remains appropriate    Co-evaluation              AM-PAC PT "6 Clicks" Mobility   Outcome Measure  Help needed turning from your back to your side while in a flat bed without using bedrails?: Total Help needed moving from lying on your back to sitting on the side of a flat bed without using bedrails?: Total Help needed moving to and from a bed to a chair (including a wheelchair)?: Total Help needed standing up from a chair using your arms (e.g., wheelchair or bedside chair)?: Total Help needed to walk in hospital room?: Total Help needed climbing 3-5 steps with a railing? : Total 6 Click Score: 6    End of Session Equipment Utilized During Treatment: Gait belt Activity Tolerance: Patient tolerated treatment well Patient left: in bed;with call bell/phone within  reach;with bed alarm set Nurse Communication: Mobility status PT Visit Diagnosis: Unsteadiness on feet (R26.81);Other abnormalities of gait and mobility (R26.89);Muscle weakness (generalized) (M62.81)     Time: [id number] PT Time Calculation (min) (ACUTE ONLY): 31 min  Charges:  $Gait Training: 23-37 mins                     Rolinda Roan, PT, DPT Acute Rehabilitation Services Secure Chat Preferred Office: [phone]    Thelma Comp 06/15/2022, 3:10 PM

## 2022-06-15 NOTE — TOC Progression Note (Signed)
Transition of Care [facility]) - Progression Note    Patient Details  Name: [name] MRN: [id number] Date of Birth: [birth year]  Transition of Care [facility]) CM/SW [place], [address] Phone Number: 06/15/2022, 11:35 AM  Clinical Narrative:   CSW notified that patient will be medically stable for discharge to SNF tomorrow. CSW confirmed with [facility] that they will have bed available and initiated insurance authorization request. CSW to follow.    Expected Discharge Plan: [place] Barriers to Discharge: Continued Medical Work up  Expected Discharge Plan and Services Expected Discharge Plan: [place] arrangements for the past 2 months: Single Family Home                                       Social Determinants of Health (SDOH) Interventions    Readmission Risk Interventions     No data to display

## 2022-06-15 NOTE — Progress Notes (Signed)
PROGRESS NOTE        PATIENT DETAILS Name: [name] Age: 71 y.o. Sex: female Date of Birth: 04-May-1951 Admit Date: 05/28/2022 Admitting Physician Ejiroghene Arlyce Dice, MD [id number], [name], MD  Brief Summary: Patient is a 71 y.o.  female with history of COPD, HTN, HLD, anxiety, prior CVA who was admitted to [facility] on 9/7 with shortness of breath due to COPD exacerbation-she was initially placed on BiPAP, however she continued to worsen and required intubation.  She was stabilized-extubated-further hospital course complicated by new onset HFrEF (possibly stress-induced), acute metabolic encephalopathy and seizures.  She was transferred to [facility] on 9/22 for LTM EEG and neurology evaluation.  Please see below for further details   Significant events: 9/07>> SOB-BiPAP-admit to [facility] at [facility]. 9/09>> intubated 9/14>> extubated 9/20>> telemetry neurology consult for confusion/encephalopathy.  EEG no seizures. 9/21>> seizure-like activity. 9/22>> transferred to [facility] LTM EEG/neurology evaluation  Significant studies: 9/08>> CT angio chest: No PE/dissection-severe COPD, patchy infiltrates right lung. 9/08>> Echo: EF 35-40%, multiple wall motion abnormalities. 9/10>> Limited Echo: EF 35-40%, mid septal/apical LV segments are akinetic. 9/20>> CT head: Multiple age indeterminate infratentorial/bilateral supratentorial cortical hypodensities-worrisome for age-indeterminate infarcts. 9/20>> MRI brain: Limited by Motion artifact-but no evidence of acute/subacute infarct 9/20>> MRA brain/neck: Limited by motion artifact-but no significant stenosis observed. 9/20>> Spot EEG: No seizures 9/22-9/23>> LTM EEG: No seizures. 9/23>> LTM EEG: No seizures.  Significant microbiology data: 9/07>> COVID/influenza PCR: Negative 9/10>> tracheal aspirate: No growth 9/12>> urine culture: No growth 9/21>> blood culture: No growth  Procedures: 9/09-9/14>>  ETT  Consults: Neurology, pulmonology, neurology, palliative care  Subjective:  Patient in bed, appears comfortable, denies any headache, no fever, no chest pain or pressure, no shortness of breath , no abdominal pain. No new focal weakness.   Objective: Vitals: Blood pressure (!) 148/64, pulse 92, temperature 98.6 F (37 C), resp. rate 18, height 5' (1.524 m), weight 50.5 kg, SpO2 99 %.   Exam:  Awake Alert, No new F.N deficits, diffuse generalized weakness Duck.AT,PERRAL Supple Neck, No JVD,   Symmetrical Chest wall movement, Good air movement bilaterally, CTAB RRR,No Gallops, Rubs or new Murmurs,  +ve B.Sounds, Abd Soft, No tenderness,   No Cyanosis, Clubbing or edema     Assessment/Plan:  Acute hypoxic/hypercarbic respiratory failure due to Mutlifocal PNA and COPD exacerbation.  Extubated on 9/14-currently stable on just 2 L of oxygen.  Has completed a course of antibiotics-continue bronchodilators.  Acute metabolic encephalopathy: Likely due to ICU/hospital delirium-possible postictal state.  Seems to be slowly improving.  Neuroimaging negative for structural abnormalities-EEG without seizures.  Neurology following.   ?Generalized tonic-clonic seizure: LTM EEG negative-not on any AEDs-neurology not convinced that patient actually had seizure-like activity at [facility].  Await further recommendations.    Elevated troponins due to demand ischemia: No obvious anginal symptoms-cardiology following-plans are for eventual LHC once she is further stabilized.  Continue antiplatelets/statins and metoprolol.    HFrEF-stress-induced/Takotsubo cardiomyopathy: Volume status stable-cardiology plans for eventual LHC rule out ischemic etiology when she is stable.  Continue metoprolol, losartan/Aldactone-use Lasix as needed.  Cardiology notified of patient's transfer to [facility] on 9/22.  History of CAD-/PCI 1997, CTO of RCA 2004: Continue beta-blocker/statin/Plavix.  History of CVA with residual right  eye blindness: Moving all 4 extremities-continue antiplatelet/statin.  HTN: BP stable-continue bisoprolol/losartan-follow and optimize.  CKD stage IIIa: At baseline.  Hypokalemia and hypomagnesemia: Repleted  Normocytic anemia: Due to critical illness/CKD-no evidence of blood loss-follow periodically.    Thrombocytopenia: Unclear etiology-mild-seems to have stabilized-watch for now.  GERD: Continue PPI  Oropharyngeal dysphagia: Plans were to place NG tube but since mentation improving-this was put on hold-patient tolerating in stock pures and medications.  I have asked RN to see if we can get SLP reevaluation today.  If her oral intake remains poor-she remains encephalopathic-we could potentially reconsider in place NG tube (Cortrak only placed Tues/Thurs)  Debility/deconditioning: PT/OT eval.  Nutrition Status: Nutrition Problem: Severe Malnutrition Etiology: chronic illness Signs/Symptoms: severe muscle depletion, severe fat depletion, percent weight loss Percent weight loss: 16 % Interventions: Refer to RD note for recommendations   Pressure Ulcer: Pressure Injury 06/06/22 Sacrum Medial Stage 2 -  Partial thickness loss of dermis presenting as a shallow open injury with a red, pink wound bed without slough. (Active)  06/06/22 2159  Location: Sacrum  Location Orientation: Medial  Staging: Stage 2 -  Partial thickness loss of dermis presenting as a shallow open injury with a red, pink wound bed without slough.  Wound Description (Comments):   Present on Admission: No  Dressing Type Foam - Lift dressing to assess site every shift 06/14/22 2309    BMI: Estimated body mass index is 21.73 kg/m as calculated from the following:   Height as of this encounter: 5' (1.524 m).   Weight as of this encounter: 50.5 kg.   Code status:   Code Status: Full Code   DVT Prophylaxis: SCDs for now-on aspirin/Plavix-platelet count downtrending.  Reassess over the next few days.   Family  Communication: Son [phone]-updated over the phone on 9/23.  I have asked him to update rest of the family members.  Disposition Plan: Status is: Inpatient Remains inpatient appropriate because: Possible new onset generalized tonic-clonic seizures-encephalopathic-LTM EEG in progress-neurology evaluation in progress-not stable for discharge.   Planned Discharge Destination:Skilled nursing facility   Diet: Diet Order             DIET - DYS 1 Room service appropriate? Yes with Assist; Fluid consistency: Thin  Diet effective now                    MEDICATIONS: Scheduled Meds:  arformoterol  15 mcg Nebulization BID   aspirin  81 mg Oral Daily   budesonide (PULMICORT) nebulizer solution  0.5 mg Nebulization BID   Chlorhexidine Gluconate Cloth  6 each Topical Q0600   citalopram  20 mg Oral Daily   clopidogrel  75 mg Oral Daily   feeding supplement  237 mL Oral TID BM   insulin aspart  0-9 Units Subcutaneous Q4H   LORazepam  1 mg Intravenous Once   losartan  12.5 mg Oral Daily   metoprolol tartrate  5 mg Intravenous Q8H   mupirocin ointment   Nasal BID   pantoprazole (PROTONIX) IV  40 mg Intravenous Q12H   potassium chloride  40 mEq Oral BID   revefenacin  175 mcg Nebulization Daily   rosuvastatin  20 mg Oral QPM   sodium chloride flush  10-40 mL Intracatheter Q12H   spironolactone  12.5 mg Oral Daily   Continuous Infusions:  sodium chloride Stopped (06/06/22 2139)   PRN Meds:.sodium chloride, acetaminophen **OR** acetaminophen, albuterol, diphenoxylate-atropine, haloperidol lactate, hydrALAZINE, LORazepam, metoprolol tartrate, nitroGLYCERIN, ondansetron **OR** ondansetron (ZOFRAN) IV, mouth rinse, polyethylene glycol, polyvinyl alcohol, sodium chloride flush   I have personally reviewed following labs and imaging studies  LABORATORY DATA:  Recent Labs  Lab 06/10/22 0414 06/11/22 0624 06/12/22 0243 06/13/22 0345 06/14/22 0600  WBC 20.5* 13.5* 15.2* 10.5  8.5  HGB 11.4* 9.2* 12.2 9.9* 9.3*  HCT 37.7 29.3* 37.1 30.4* 28.5*  PLT 127* 87* 86* 88* 89*  MCV 117.4* 111.4* 107.5* 108.2* 107.1*  MCH 35.5* 35.0* 35.4* 35.2* 35.0*  MCHC 30.2 31.4 32.9 32.6 32.6  RDW 16.0* 15.8* 15.3 14.9 15.2  LYMPHSABS  --  1.3 1.3 1.1 1.0  MONOABS  --  1.0 1.0 0.9 0.9  EOSABS  --  0.0 0.0 0.0 0.0  BASOSABS  --  0.0 0.0 0.0 0.0    Recent Labs  Lab 06/09/22 0406 06/10/22 0414 06/10/22 1924 06/11/22 0624 06/12/22 0243 06/13/22 0345 06/14/22 0600 06/15/22 0250  NA 148* 148*  --  136 140 138 138 139  K 3.2* 3.4*  --  4.3 3.9 3.0* 3.2* 3.2*  CL 112* 108  --  99 100 99 102 103  CO2 30 30  --  31 29 29 29 30   GLUCOSE 98 121*  --  334* 113* 221* 124* 104*  BUN 17 17  --  10 9 6* 7* 7*  CREATININE 0.79 1.12*  --  0.88 0.92 0.82 0.88 0.86  CALCIUM 9.0 9.1  --  7.7* 8.8* 8.7* 8.5* 8.2*  AST  --  39  --  34 38 32 31  --   ALT  --  99*  --  70* 69* 52* 43  --   ALKPHOS  --  60  --  49 60 54 51  --   BILITOT  --  1.2  --  1.2 1.2 1.3* 1.2  --   ALBUMIN  --  2.9*  --  2.3* 2.6* 2.3* 2.2*  --   MG 1.7 2.0  --   --   --   --  1.6* 2.1  PHOS  --   --   --  2.4*  --   --   --   --   PROCALCITON  --   --   --  0.21  --   --   --   --   AMMONIA  --   --  27  --   --   --   --   --   BNP 1,803.0*  --   --   --   --   --   --   --      RADIOLOGY STUDIES/RESULTS: No results found.   LOS: 18 days   Signature  Lala Lund M.D on 06/15/2022 at 10:11 AM   -  To page go to [email or web address]

## 2022-06-15 NOTE — Plan of Care (Signed)
  Problem: Respiratory: Goal: Will maintain a patent airway Outcome: Progressing   Problem: Clinical Measurements: Goal: Respiratory complications will improve Outcome: Progressing   Problem: Nutrition: Goal: Adequate nutrition will be maintained Outcome: Progressing   Problem: Coping: Goal: Level of anxiety will decrease Outcome: Progressing   Problem: Safety: Goal: Ability to remain free from injury will improve Outcome: Progressing

## 2022-06-16 DIAGNOSIS — J9601 Acute respiratory failure with hypoxia: Secondary | ICD-10-CM | POA: Diagnosis not present

## 2022-06-16 DIAGNOSIS — J9602 Acute respiratory failure with hypercapnia: Secondary | ICD-10-CM | POA: Diagnosis not present

## 2022-06-16 LAB — GLUCOSE, CAPILLARY
Glucose-Capillary: 91 mg/dL (ref 70–99)
Glucose-Capillary: 167 mg/dL — ABNORMAL HIGH (ref 70–99)
Glucose-Capillary: 234 mg/dL — ABNORMAL HIGH (ref 70–99)
Glucose-Capillary: 119 mg/dL — ABNORMAL HIGH (ref 70–99)

## 2022-06-16 LAB — CULTURE, BLOOD (ROUTINE X 2)
Culture: NO GROWTH
Culture: NO GROWTH
Special Requests: ADEQUATE

## 2022-06-16 LAB — BASIC METABOLIC PANEL
Anion gap: 5 (ref 5–15)
BUN: 5 mg/dL — ABNORMAL LOW (ref 8–23)
CO2: 31 mmol/L (ref 22–32)
Calcium: 8.6 mg/dL — ABNORMAL LOW (ref 8.9–10.3)
Chloride: 104 mmol/L (ref 98–111)
Creatinine, Ser: 0.71 mg/dL (ref 0.44–1.00)
GFR, Estimated: >60 mL/min (ref >60)
Glucose, Bld: 106 mg/dL — ABNORMAL HIGH (ref 70–99)
Potassium: 3.7 mmol/L (ref 3.5–5.1)
Sodium: 140 mmol/L (ref 135–145)

## 2022-06-16 MED ORDER — ORAL CARE MOUTH RINSE: 15.0000 mL | OROMUCOSAL | Status: DC | PRN

## 2022-06-16 MED ORDER — PROSOURCE PLUS PO LIQD
30.0000 mL | Freq: Two times a day (BID) | ORAL | Status: DC
  Administered 2022-06-16 (×2): 30 mL via ORAL
  Filled 2022-06-16 (×3): qty 30

## 2022-06-16 NOTE — Progress Notes (Signed)
Speech Language Pathology Treatment: Dysphagia  Patient Details Name: [name] MRN: [id number] DOB: 1950-12-29 Today's Date: 06/16/2022 Time: 3267-1245 SLP Time Calculation (min) (ACUTE ONLY): 17 min  Assessment / Plan / Recommendation Clinical Impression  [name] was sitting in recliner upon arrival.  She was found eating [employer] earlier today.  She does continue to have safety concerns when left alone with solid foods - she demonstrates impaired attention with resulting portions of food falling to her lap without recognition or hanging from her mouth without awareness.  She is ready to advance back to her baseline diet of dysphagia 2/thin liquids but will need help with tray set-up and assistance to feed herself. When sitting upright, she is able to drink thin liquids with no concerns for aspiration.   HPI HPI: [name] is [name] on 05/28/22 with SOB secondary to COPD exacerbation. [name] was initially placed on BiPAP, however she continued to worsen and required intubation. ETT 9/9-9/14. No [facility] documented. Hospital course complicated by new onset HFrEF, acute metabolic encephalopathy and seizures. [name] transferred to [facility] on 9/22 for further workup. MRI 9/20 negative for acute changes. EEG 9/22: negative for siezures, generalized periodic discharges with triphasic morphology thought to be indicative of toxic-metabolic causes. Moderate diffuse encephalopathy. 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 [name] followed for swallowing 05/04/22-05/05/22 with recommendation for dysphagia 2 solids with thin liquids, and GI consult due to question of esphageal component. EGD 8/18: Two benign-appearing, intrinsic mild stenoses were found in the distal esophagus and dilation performed; advance diet  as tolerated.      SLP Plan  Continue with current plan of care      Recommendations for follow up therapy are one component of a multi-disciplinary discharge planning process, led by the attending physician.  Recommendations may be updated based on patient status, additional functional criteria and insurance authorization.    Recommendations  Diet recommendations: Dysphagia 2 (fine chop) Liquids provided via: Cup;Straw Medication Administration: Whole meds with puree Supervision: Full supervision/cueing for compensatory strategies Compensations: Slow rate;Small sips/bites Postural Changes and/or Swallow Maneuvers: Seated upright 90 degrees;Upright 30-60 min after meal                Oral Care Recommendations: Oral care BID Follow Up Recommendations: Skilled nursing-short term rehab (<3 hours/day) SLP Visit Diagnosis: Dysphagia, unspecified (R13.10) Plan: Continue with current plan of care        Keghan Mcfarren L. Tivis Ringer, MA CCC/SLP Clinical Specialist - Acute Care SLP Acute Rehabilitation Services Office number [phone]    [name]  06/16/2022, 3:38 PM

## 2022-06-16 NOTE — Progress Notes (Signed)
Occupational Therapy Treatment Patient Details Name: [name] MRN: [id number] DOB: Jan 05, 1951 Today's Date: 06/16/2022   History of present illness Pt is a 71 y/o female [facility]ing to  [facility] on [facility]7 with SOB due to COPD exacerbation. Placed on bipap initally, progressing to intubation with ETT 9/9-9/14. Course complicated by new onset HFrEF, acute metabolic encephalopathy and seizures.  Transferred to [facility] on 9/22.  MRI negative, EEG 9/22 negative for seizures, moderate diffuse encephalopathy. PMH includes: HTN, CVA, CAD, blind R eye, depression, psychosis.   OT comments  Patient in bed upon entry, agreeable to OT session. Pt progressing well towards OT goals. Completing bed mobility with min assist +2 safety, given increased time and support only to scoot to EOB.  She completes sit to stand from EOB with min assist +2 due to posterior lean, stepping to recliner with min assist +2 hand held support.  Cueing to decreased lean, but only improved once RW was placed in front of pt.  Fatigues easily during session. Noted upon entry, pt in bed with Bojangles- SLP notified as this is not her diet.     Recommendations for follow up therapy are one component of a multi-disciplinary discharge planning process, led by the attending physician.  Recommendations may be updated based on patient status, additional functional criteria and insurance authorization.    Follow Up Recommendations  Skilled nursing-short term rehab (<3 hours/day)    Assistance Recommended at Discharge Frequent or constant Supervision/Assistance  Patient can return home with the following  A lot of help with walking and/or transfers;A lot of help with bathing/dressing/bathroom;Assistance with cooking/housework;Direct supervision/assist for medications management;Direct supervision/assist for financial management;Assist for transportation;Help with stairs or ramp for entrance   Equipment Recommendations  Other (comment) (defer)     Recommendations for Other Services      Precautions / Restrictions Precautions Precautions: Fall Restrictions Weight Bearing Restrictions: No       Mobility Bed Mobility Overal bed mobility: Needs Assistance Bed Mobility: Supine to Sit     Supine to sit: Min assist, +2 for safety/equipment, HOB elevated     General bed mobility comments: increased time to initate but able to transition to EOB with min guard, min assist to scoot foward    Transfers Overall transfer level: Needs assistance Equipment used: 2 person hand held assist, Rolling walker (2 wheels) Transfers: Sit to/from Stand, Bed to chair/wheelchair/BSC Sit to Stand: Min assist, +2 physical assistance, +2 safety/equipment     Step pivot transfers: Min assist, +2 physical assistance, +2 safety/equipment     General transfer comment: pt powering up from EOB with min assist +2, heavy posterior lean. Stepping to recliner with min assist +2 for balance and safety.  Once in recliner, sit to stand 3x with hand held support and RW- increased anterior translation using RW.     Balance Overall balance assessment: Needs assistance Sitting-balance support: Feet supported, No upper extremity supported Sitting balance-Leahy Scale: Fair Sitting balance - Comments: min guard at EOB   Standing balance support: Bilateral upper extremity supported, During functional activity Standing balance-Leahy Scale: Poor Standing balance comment: relies on external and UE support                           ADL either performed or assessed with clinical judgement   ADL Overall ADL's : Needs assistance/impaired Eating/Feeding: Sitting;Set up;Supervision/ safety Eating/Feeding Details (indicate cue type and reason): requires cueing for small sips for drink, does not  follow and coughs after (notified SLP of pt eating bojangles upon entry)                 Lower Body Dressing: Moderate assistance;+2 for safety/equipment;Sit  to/from stand   Toilet Transfer: Minimal assistance;+2 for physical assistance;+2 for safety/equipment;Ambulation Toilet Transfer Details (indicate cue type and reason): bilateral hand held support         Functional mobility during ADLs: Minimal assistance;+2 for physical assistance;+2 for safety/equipment;Cueing for safety;Cueing for sequencing      Extremity/Trunk Assessment              Vision       Perception     Praxis      Cognition Arousal/Alertness: Awake/alert Behavior During Therapy: Flat affect Overall Cognitive Status: Impaired/Different from baseline Area of Impairment: Attention, Following commands, Awareness, Problem solving                   Current Attention Level: Sustained   Following Commands: Follows one step commands consistently, Follows one step commands with increased time, Follows multi-step commands inconsistently Safety/Judgement: Decreased awareness of safety, Decreased awareness of deficits Awareness: Emergent Problem Solving: Slow processing, Decreased initiation, Requires verbal cues, Difficulty sequencing General Comments: pt following simple commands with increased time, poor awareness to deficits.        Exercises      Shoulder Instructions       General Comments notified SLP of pt eating bojangles upon entry slouched in bed, drinking soda and coughing afterwards    Pertinent Vitals/ Pain       Pain Assessment Pain Assessment: Faces Faces Pain Scale: No hurt  Home Living                                          Prior Functioning/Environment              Frequency  Min 2X/week        Progress Toward Goals  OT Goals(current goals can now be found in the care plan section)  Progress towards OT goals: Progressing toward goals  Acute Rehab OT Goals Patient Stated Goal: get out of he re OT Goal Formulation: With patient Time For Goal Achievement: 06/26/22 Potential to Achieve  Goals: [place] Discharge plan remains appropriate;Frequency remains appropriate    Co-evaluation                 AM-PAC OT "6 Clicks" Daily Activity     Outcome Measure   Help from another person eating meals?: A Little Help from another person taking care of personal grooming?: A Little Help from another person toileting, which includes using toliet, bedpan, or urinal?: A Lot Help from another person bathing (including washing, rinsing, drying)?: A Lot Help from another person to put on and taking off regular upper body clothing?: A Little Help from another person to put on and taking off regular lower body clothing?: A Lot 6 Click Score: 15    End of Session Equipment Utilized During Treatment: Rolling walker (2 wheels);Oxygen  OT Visit Diagnosis: Other abnormalities of gait and mobility (R26.89);Muscle weakness (generalized) (M62.81);Other symptoms and signs involving cognitive function;Pain Pain - part of body:  (bottom)   Activity Tolerance Patient tolerated treatment well   Patient Left in chair;with call bell/phone within reach;with chair alarm set   Nurse Communication Mobility status  Time: 1202-1221 OT Time Calculation (min): 19 min  Charges: OT General Charges $OT Visit: 1 Visit OT Treatments $Self Care/Home Management : 8-22 mins  Barry Brunner, OT Acute Rehabilitation Services Office [phone]   Chancy Milroy 06/16/2022, 1:43 PM

## 2022-06-16 NOTE — TOC Progression Note (Signed)
Transition of Care [facility]) - Progression Note    Patient Details  Name: [name] MRN: [id number] Date of Birth: [birth year]  Transition of Care [facility]) CM/SW Contact  Pollie Friar, RN Phone Number: 06/16/2022, 10:10 AM  Clinical Narrative:    Got message from MD that son may want to take patient home at d/c. CM met with the patient and her son at the bedside. Son prefers she attend SNF rehab for a short rehab stay then return home. He will be providing care alone at home and would need her to be a little stronger when returning home.  Insurance already pending for [employer]. Son on board with this and MD updated.  TOC following.   Expected Discharge Plan: [place] Barriers to Discharge: Continued Medical Work up  Expected Discharge Plan and Services Expected Discharge Plan: [place] arrangements for the past 2 months: Single Family Home                                       Social Determinants of Health (SDOH) Interventions    Readmission Risk Interventions     No data to display

## 2022-06-16 NOTE — Progress Notes (Signed)
Pt refusing 0400 VS and BG, will attempt again a little later.

## 2022-06-16 NOTE — Progress Notes (Signed)
PROGRESS NOTE        PATIENT DETAILS Name: [name] Age: 71 y.o. Sex: female Date of Birth: [birth year] Admit Date: 05/28/2022 Admitting Physician Ejiroghene Arlyce Dice, MD UD:[id number], [name], MD  Brief Summary: Patient is a 71 y.o.  female with history of COPD, HTN, HLD, anxiety, prior CVA who was admitted to [facility] on 9/7 with shortness of breath due to COPD exacerbation-she was initially placed on BiPAP, however she continued to worsen and required intubation.  She was stabilized-extubated-further hospital course complicated by new onset HFrEF (possibly stress-induced), acute metabolic encephalopathy and seizures.  She was transferred to [facility] on 9/22 for LTM EEG and neurology evaluation.  Please see below for further details   Significant events: 9/07>> SOB-BiPAP-admit to [facility] at [facility]. 9/09>> intubated 9/14>> extubated 9/20>> telemetry neurology consult for confusion/encephalopathy.  EEG no seizures. 9/21>> seizure-like activity. 9/22>> transferred to [facility] LTM EEG/neurology evaluation  Significant studies: 9/08>> CT angio chest: No PE/dissection-severe COPD, patchy infiltrates right lung. 9/08>> Echo: EF 35-40%, multiple wall motion abnormalities. 9/10>> Limited Echo: EF 35-40%, mid septal/apical LV segments are akinetic. 9/20>> CT head: Multiple age indeterminate infratentorial/bilateral supratentorial cortical hypodensities-worrisome for age-indeterminate infarcts. 9/20>> MRI brain: Limited by Motion artifact-but no evidence of acute/subacute infarct 9/20>> MRA brain/neck: Limited by motion artifact-but no significant stenosis observed. 9/20>> Spot EEG: No seizures 9/22-9/23>> LTM EEG: No seizures. 9/23>> LTM EEG: No seizures.  Significant microbiology data: 9/07>> COVID/influenza PCR: Negative 9/10>> tracheal aspirate: No growth 9/12>> urine culture: No growth 9/21>> blood culture: No growth  Procedures: 9/09-9/14>>  ETT  Consults: Neurology, pulmonology, neurology, palliative care  Subjective:  Patient in bed, appears comfortable, denies any headache, no fever, no chest pain or pressure, no shortness of breath , no abdominal pain. No new focal weakness.   Objective: Vitals: Blood pressure (!) 144/72, pulse 94, temperature 98.3 F (36.8 C), temperature source Oral, resp. rate 17, height 5' (1.524 m), weight 50.5 kg, SpO2 100 %.   Exam:  Awake Alert, No new F.N deficits, diffuse generalized weakness [place].AT,PERRAL Supple Neck, No JVD,   Symmetrical Chest wall movement, Good air movement bilaterally, CTAB RRR,No Gallops, Rubs or new Murmurs,  +ve B.Sounds, Abd Soft, No tenderness,   No Cyanosis, Clubbing or edema     Assessment/Plan:  Acute hypoxic/hypercarbic respiratory failure due to Mutlifocal PNA and COPD exacerbation.  Extubated on 9/14-currently stable on just 2 L of oxygen.  Has completed a course of antibiotics-continue bronchodilators.  Acute metabolic encephalopathy: Likely due to ICU/hospital delirium-possible postictal state.  Seems to be slowly improving.  Neuroimaging negative for structural abnormalities-EEG without seizures.  Neurology following.   ?Generalized tonic-clonic seizure: LTM EEG negative-not on any AEDs-neurology not convinced that patient actually had seizure-like activity at [facility].  Await further recommendations.    Elevated troponins due to demand ischemia: No obvious anginal symptoms-cardiology following-plans are for eventual LHC once she is further stabilized.  Continue antiplatelets/statins and metoprolol.    HFrEF-stress-induced/Takotsubo cardiomyopathy: Volume status stable-cardiology plans for eventual LHC rule out ischemic etiology when she is stable.  Continue metoprolol, losartan/Aldactone-use Lasix as needed.  Cardiology notified of patient's transfer to [facility] on 9/22.  History of CAD-/PCI 1997, CTO of RCA 2004: Continue  beta-blocker/statin/Plavix.  History of CVA with residual right eye blindness: Moving all 4 extremities-continue antiplatelet/statin.  HTN: BP stable-continue bisoprolol/losartan-follow and optimize.  CKD stage IIIa:  At baseline.  Hypokalemia and hypomagnesemia: Repleted  Normocytic anemia: Due to critical illness/CKD-no evidence of blood loss-follow periodically.    Thrombocytopenia: Unclear etiology-mild-seems to have stabilized-watch for now.  GERD: Continue PPI  Oropharyngeal dysphagia: Following.  Discussed with son no core track of feeding tube, discharged home with appropriate diet and protein supplements.  Debility/deconditioning: PT/OT eval. Family wants to take her home with home health PT and DME on 06/17/2022.  Nutrition Status: Nutrition Problem: Severe Malnutrition Etiology: chronic illness Signs/Symptoms: severe muscle depletion, severe fat depletion, percent weight loss Percent weight loss: 16 % Interventions: Refer to RD note for recommendations   Pressure Ulcer: Pressure Injury 06/06/22 Sacrum Medial Stage 2 -  Partial thickness loss of dermis presenting as a shallow open injury with a red, pink wound bed without slough. (Active)  06/06/22 2159  Location: Sacrum  Location Orientation: Medial  Staging: Stage 2 -  Partial thickness loss of dermis presenting as a shallow open injury with a red, pink wound bed without slough.  Wound Description (Comments):   Present on Admission: No  Dressing Type Foam - Lift dressing to assess site every shift 06/15/22 2002    BMI: Estimated body mass index is 21.73 kg/m as calculated from the following:   Height as of this encounter: 5' (1.524 m).   Weight as of this encounter: 50.5 kg.   Code status:   Code Status: Full Code   DVT Prophylaxis: SCDs for now-on aspirin/Plavix-platelet count downtrending.  Reassess over the next few days.   Family Communication: Son Mark-[phone]-updated over the phone on  06/16/22  Disposition Plan: Status is: Inpatient Remains inpatient appropriate because: Possible new onset generalized tonic-clonic seizures-encephalopathic-LTM EEG in progress-neurology evaluation in progress-not stable for discharge.   Planned Discharge Destination:Skilled nursing facility   Diet: Diet Order             DIET - DYS 1 Room service appropriate? Yes with Assist; Fluid consistency: Thin  Diet effective now                    MEDICATIONS: Scheduled Meds:  (feeding supplement) PROSource Plus  30 mL Oral BID BM   arformoterol  15 mcg Nebulization BID   aspirin  81 mg Oral Daily   budesonide (PULMICORT) nebulizer solution  0.5 mg Nebulization BID   Chlorhexidine Gluconate Cloth  6 each Topical Q0600   citalopram  20 mg Oral Daily   clopidogrel  75 mg Oral Daily   feeding supplement  237 mL Oral TID BM   insulin aspart  0-9 Units Subcutaneous Q4H   LORazepam  1 mg Intravenous Once   losartan  12.5 mg Oral Daily   metoprolol tartrate  5 mg Intravenous Q8H   mupirocin ointment   Nasal BID   pantoprazole (PROTONIX) IV  40 mg Intravenous Q12H   revefenacin  175 mcg Nebulization Daily   rosuvastatin  20 mg Oral QPM   spironolactone  12.5 mg Oral Daily   Continuous Infusions:  sodium chloride Stopped (06/06/22 2139)   PRN Meds:.sodium chloride, acetaminophen **OR** acetaminophen, albuterol, diphenoxylate-atropine, haloperidol lactate, hydrALAZINE, LORazepam, metoprolol tartrate, nitroGLYCERIN, ondansetron **OR** ondansetron (ZOFRAN) IV, mouth rinse, polyethylene glycol, polyvinyl alcohol, sodium chloride flush   I have personally reviewed following labs and imaging studies  LABORATORY DATA:  Recent Labs  Lab 06/10/22 0414 06/11/22 0624 06/12/22 0243 06/13/22 0345 06/14/22 0600  WBC 20.5* 13.5* 15.2* 10.5 8.5  HGB 11.4* 9.2* 12.2 9.9* 9.3*  HCT 37.7 29.3* 37.1 30.4*  28.5*  PLT 127* 87* 86* 88* 89*  MCV 117.4* 111.4* 107.5* 108.2* 107.1*  MCH 35.5*  35.0* 35.4* 35.2* 35.0*  MCHC 30.2 31.4 32.9 32.6 32.6  RDW 16.0* 15.8* 15.3 14.9 15.2  LYMPHSABS  --  1.3 1.3 1.1 1.0  MONOABS  --  1.0 1.0 0.9 0.9  EOSABS  --  0.0 0.0 0.0 0.0  BASOSABS  --  0.0 0.0 0.0 0.0    Recent Labs  Lab 06/10/22 0414 06/10/22 1924 06/11/22 0624 06/12/22 0243 06/13/22 0345 06/14/22 0600 06/15/22 0250 06/16/22 0413  NA 148*  --  136 140 138 138 139 140  K 3.4*  --  4.3 3.9 3.0* 3.2* 3.2* 3.7  CL 108  --  99 100 99 102 103 104  CO2 30  --  31 29 29 29 30 31   GLUCOSE 121*  --  334* 113* 221* 124* 104* 106*  BUN 17  --  10 9 6* 7* 7* 5*  CREATININE 1.12*  --  0.88 0.92 0.82 0.88 0.86 0.71  CALCIUM 9.1  --  7.7* 8.8* 8.7* 8.5* 8.2* 8.6*  AST 39  --  34 38 32 31  --   --   ALT 99*  --  70* 69* 52* 43  --   --   ALKPHOS 60  --  49 60 54 51  --   --   BILITOT 1.2  --  1.2 1.2 1.3* 1.2  --   --   ALBUMIN 2.9*  --  2.3* 2.6* 2.3* 2.2*  --   --   MG 2.0  --   --   --   --  1.6* 2.1  --   PHOS  --   --  2.4*  --   --   --   --   --   PROCALCITON  --   --  0.21  --   --   --   --   --   AMMONIA  --  27  --   --   --   --   --   --      RADIOLOGY STUDIES/RESULTS: No results found.   LOS: 19 days   Signature  Lala Lund M.D on 06/16/2022 at 10:09 AM   -  To page go to [email or web address]

## 2022-06-17 DIAGNOSIS — I2489 Other forms of acute ischemic heart disease: Secondary | ICD-10-CM | POA: Diagnosis not present

## 2022-06-17 DIAGNOSIS — I509 Heart failure, unspecified: Secondary | ICD-10-CM | POA: Diagnosis not present

## 2022-06-17 DIAGNOSIS — Z7401 Bed confinement status: Secondary | ICD-10-CM | POA: Diagnosis not present

## 2022-06-17 DIAGNOSIS — E46 Unspecified protein-calorie malnutrition: Secondary | ICD-10-CM | POA: Diagnosis not present

## 2022-06-17 DIAGNOSIS — Z743 Need for continuous supervision: Secondary | ICD-10-CM | POA: Diagnosis not present

## 2022-06-17 DIAGNOSIS — I5020 Unspecified systolic (congestive) heart failure: Secondary | ICD-10-CM | POA: Diagnosis not present

## 2022-06-17 DIAGNOSIS — L89150 Pressure ulcer of sacral region, unstageable: Secondary | ICD-10-CM | POA: Diagnosis not present

## 2022-06-17 DIAGNOSIS — I679 Cerebrovascular disease, unspecified: Secondary | ICD-10-CM | POA: Diagnosis not present

## 2022-06-17 DIAGNOSIS — R918 Other nonspecific abnormal finding of lung field: Secondary | ICD-10-CM | POA: Diagnosis not present

## 2022-06-17 DIAGNOSIS — J9602 Acute respiratory failure with hypercapnia: Secondary | ICD-10-CM | POA: Diagnosis not present

## 2022-06-17 DIAGNOSIS — R531 Weakness: Secondary | ICD-10-CM | POA: Diagnosis not present

## 2022-06-17 DIAGNOSIS — I63019 Cerebral infarction due to thrombosis of unspecified vertebral artery: Secondary | ICD-10-CM | POA: Diagnosis not present

## 2022-06-17 DIAGNOSIS — R1310 Dysphagia, unspecified: Secondary | ICD-10-CM | POA: Diagnosis not present

## 2022-06-17 DIAGNOSIS — N1831 Chronic kidney disease, stage 3a: Secondary | ICD-10-CM | POA: Diagnosis not present

## 2022-06-17 DIAGNOSIS — M6281 Muscle weakness (generalized): Secondary | ICD-10-CM | POA: Diagnosis not present

## 2022-06-17 DIAGNOSIS — I2510 Atherosclerotic heart disease of native coronary artery without angina pectoris: Secondary | ICD-10-CM | POA: Diagnosis not present

## 2022-06-17 DIAGNOSIS — R2689 Other abnormalities of gait and mobility: Secondary | ICD-10-CM | POA: Diagnosis not present

## 2022-06-17 DIAGNOSIS — J9601 Acute respiratory failure with hypoxia: Secondary | ICD-10-CM | POA: Diagnosis not present

## 2022-06-17 DIAGNOSIS — I10 Essential (primary) hypertension: Secondary | ICD-10-CM | POA: Diagnosis not present

## 2022-06-17 DIAGNOSIS — J449 Chronic obstructive pulmonary disease, unspecified: Secondary | ICD-10-CM | POA: Diagnosis not present

## 2022-06-17 DIAGNOSIS — J441 Chronic obstructive pulmonary disease with (acute) exacerbation: Secondary | ICD-10-CM | POA: Diagnosis not present

## 2022-06-17 LAB — GLUCOSE, CAPILLARY
Glucose-Capillary: 113 mg/dL — ABNORMAL HIGH (ref 70–99)
Glucose-Capillary: 113 mg/dL — ABNORMAL HIGH (ref 70–99)
Glucose-Capillary: 89 mg/dL (ref 70–99)
Glucose-Capillary: 97 mg/dL (ref 70–99)

## 2022-06-17 MED ORDER — LOSARTAN POTASSIUM 25 MG PO TABS: 12.5000 mg | ORAL_TABLET | Freq: Every day | ORAL | Status: DC

## 2022-06-17 MED ORDER — ROSUVASTATIN CALCIUM 20 MG PO TABS: 20.0000 mg | ORAL_TABLET | Freq: Every evening | ORAL | Status: DC

## 2022-06-17 MED ORDER — CLOPIDOGREL BISULFATE 75 MG PO TABS: 75.0000 mg | ORAL_TABLET | Freq: Every day | ORAL | Status: AC

## 2022-06-17 NOTE — Telephone Encounter (Signed)
Per epic patient currently admitted since 05/28/22.

## 2022-06-17 NOTE — Discharge Summary (Signed)
[name] [id number] DOB: [birth year] DOA: 05/28/2022  PCP: Carrolyn Meiers, MD  Admit date: 05/28/2022  Discharge date: 06/17/2022  Admitted From: Home   Disposition:  SNF   Recommendations for Outpatient Follow-up:   Follow up with PCP in 1-2 weeks  PCP Please obtain BMP/CBC, 2 view CXR in 1week,  (see Discharge instructions)   PCP Please follow up on the following pending results:    Home Health: None   Equipment/Devices: None  Consultations: Neurology, pulmonology,  palliative care Discharge Condition: Fair CODE STATUS: Full    Diet Recommendation: Dysphagia 2 diet with feeding assistance and aspiration precautions.   Chief Complaint  Patient presents with   Shortness of Breath     Brief history of present illness from the day of admission and additional interim summary    71 y.o.  female with history of COPD, HTN, HLD, anxiety, prior CVA who was admitted to [facility] on 9/7 with shortness of breath due to COPD exacerbation-she was initially placed on BiPAP, however she continued to worsen and required intubation.  She was stabilized-extubated-further hospital course complicated by new onset HFrEF (possibly stress-induced), acute metabolic encephalopathy and seizures.  She was transferred to [facility] on 9/22 for LTM EEG and neurology evaluation.  Please see below for further details    Significant events: 9/07>> SOB-BiPAP-admit to [facility] at [facility]. 9/09>> intubated 9/14>> extubated 9/20>> telemetry neurology consult for confusion/encephalopathy.  EEG no seizures. 9/21>> seizure-like activity. 9/22>> transferred to [facility] LTM EEG/neurology evaluation   Significant studies: 9/08>> CT angio chest: No PE/dissection-severe COPD, patchy infiltrates right lung. 9/08>> Echo: EF 35-40%, multiple wall motion  abnormalities. 9/10>> Limited Echo: EF 35-40%, mid septal/apical LV segments are akinetic. 9/20>> CT head: Multiple age indeterminate infratentorial/bilateral supratentorial cortical hypodensities-worrisome for age-indeterminate infarcts. 9/20>> MRI brain: Limited by Motion artifact-but no evidence of acute/subacute infarct 9/20>> MRA brain/neck: Limited by motion artifact-but no significant stenosis observed. 9/20>> Spot EEG: No seizures 9/22-9/23>> LTM EEG: No seizures. 9/23>> LTM EEG: No seizures.   Significant microbiology data: 9/07>> COVID/influenza PCR: Negative 9/10>> tracheal aspirate: No growth 9/12>> urine culture: No growth 9/21>> blood culture: No growth   Procedures: 9/09-9/14>> ETT                                                                 Hospital Course    Acute hypoxic/hypercarbic respiratory failure due to Mutlifocal PNA and COPD exacerbation.  Extubated on 9/14-currently stable on just 2 L of oxygen.  Has completed a course of antibiotics-continue bronchodilators.  For now stable continue supplemental 2 L nasal cannula oxygen for now.  I-S and flutter valve for pulmonary toiletry at SNF.   Acute metabolic encephalopathy: Likely due to ICU/hospital delirium-possible postictal state.  Seems to be slowly improving.  Neuroimaging negative for structural abnormalities-EEG without  seizures.  Seen by neurology close to baseline.   ?Generalized tonic-clonic seizure: LTM EEG negative-not on any AEDs-neurology not convinced that patient actually had seizure-like activity at [facility].  Neurology no AEDs for now.   Elevated troponins due to demand ischemia: No obvious anginal symptoms-cardiology following-plans are for eventual LHC once she is further stabilized.  Continue antiplatelets/statins and metoprolol.  Outpatient cardiology follow-up in 1 to 2 weeks postdischarge.   HFrEF-stress-induced/Takotsubo cardiomyopathy: Volume status stable-cardiology plans for eventual LHC rule  out ischemic etiology when she is stable, few weeks post discharge.  Continue metoprolol, losartan.  Cardiology follow-up outpatient in 1 to 2 weeks postdischarge.  History of CAD-/PCI 1997, CTO of RCA 2004: Continue beta-blocker/statin/Plavix.   History of CVA with residual right eye blindness: Moving all 4 extremities-continue antiplatelet/statin.   HTN: BP stable-continue bisoprolol/losartan-follow and optimize.   CKD stage IIIa: At baseline.   Hypokalemia and hypomagnesemia: Repleted   Normocytic anemia: Due to critical illness/CKD-no evidence of blood loss-follow periodically.     Thrombocytopenia: Unclear etiology-mild-seems to have stabilized-watch for now.   GERD: Continue PPI   Oropharyngeal dysphagia: Following.  Discussed with son no core track of feeding tube, discharged home with appropriate diet and protein supplements.  Dysphagia 2 diet continue supplementation of dysphagia 2 diet with feeding assistance and aspiration precautions at SNF with close speech therapy follow-up.   Debility/deconditioning: PT/OT eval. SNF.   Discharge diagnosis     Principal Problem:   Acute respiratory failure with hypoxia and hypercarbia (HCC) Active Problems:   COPD with acute exacerbation (HCC)   Elevated troponin   Lobar pneumonia (HCC)   Essential hypertension   History of stroke   Gastroesophageal reflux disease   Chronic kidney disease, stage 3a (HCC)   Anemia   Malnutrition of moderate degree   Cardiomyopathy (HCC)   Hyperkalemia   Hypophosphatemia   Impaired glucose tolerance   Hypernatremia   Protein-calorie malnutrition, severe    Discharge instructions    Discharge Instructions     Discharge instructions   Complete by: As directed    Follow with Primary MD Carrolyn Meiers, MD in 7 days   Get CBC, CMP, 2 view Chest X ray -  checked next visit within 1 week by SNF MD    Activity: As tolerated with Full fall precautions use walker/cane & assistance  as needed  Disposition SNF  Diet: Dysphagia 2 diet with feeding assistance and aspiration precautions.  Special Instructions: If you have smoked or chewed Tobacco  in the last 2 yrs please stop smoking, stop any regular Alcohol  and or any Recreational drug use.  On your next visit with your primary care physician please Get Medicines reviewed and adjusted.  Please request your Prim.MD to go over all Hospital Tests and Procedure/Radiological results at the follow up, please get all Hospital records sent to your Prim MD by signing hospital release before you go home.  If you experience worsening of your admission symptoms, develop shortness of breath, life threatening emergency, suicidal or homicidal thoughts you must seek medical attention immediately by calling 911 or calling your MD immediately  if symptoms less severe.  You Must read complete instructions/literature along with all the possible adverse reactions/side effects for all the Medicines you take and that have been prescribed to you. Take any new Medicines after you have completely understood and accpet all the possible adverse reactions/side effects.   Increase activity slowly   Complete by: As directed    No wound  care   Complete by: As directed        Discharge Medications   Allergies as of 06/17/2022       Reactions   Aspirin Swelling, Other (See Comments)   Only in large doses will cause a reaction   Bee Venom Swelling   Severe swelling   Contrast Media [iodinated Contrast Media] Swelling   Lisinopril Swelling   Motrin [ibuprofen] Swelling   Penicillins Anaphylaxis   Dilaudid [hydromorphone Hcl] Itching        Medication List     STOP taking these medications    atorvastatin 20 MG tablet Commonly known as: LIPITOR   cloNIDine 0.1 MG tablet Commonly known as: CATAPRES   fluconazole 150 MG tablet Commonly known as: DIFLUCAN   megestrol 400 MG/10ML suspension Commonly known as: MEGACE   predniSONE  10 MG tablet Commonly known as: DELTASONE       TAKE these medications    acetaminophen 325 MG tablet Commonly known as: TYLENOL Take 2 tablets (650 mg total) by mouth every 6 (six) hours as needed for mild pain or headache (or Fever >/= 101).   ascorbic acid 500 MG tablet Commonly known as: VITAMIN C Take 1 tablet (500 mg total) by mouth daily.   aspirin EC 81 MG tablet Take 81 mg by mouth daily.   citalopram 20 MG tablet Commonly known as: CELEXA Take 20 mg by mouth daily.   clopidogrel 75 MG tablet Commonly known as: PLAVIX Take 1 tablet (75 mg total) by mouth daily.   cyanocobalamin 500 MCG tablet Commonly known as: VITAMIN B12 Take 1 tablet (500 mcg total) by mouth daily.   dextromethorphan-guaiFENesin 30-600 MG 12hr tablet Commonly known as: MUCINEX DM Take 1 tablet by mouth 2 (two) times daily.   docusate sodium 100 MG capsule Commonly known as: COLACE Take 100 mg by mouth daily.   folic acid 1 MG tablet Commonly known as: FOLVITE Take 1 tablet (1 mg total) by mouth daily.   Ipratropium-Albuterol 20-100 MCG/ACT Aers respimat Commonly known as: COMBIVENT Inhale 1 puff into the lungs every 6 (six) hours as needed for wheezing or shortness of breath.   loratadine 10 MG tablet Commonly known as: CLARITIN Take 10 mg by mouth daily.   losartan 25 MG tablet Commonly known as: COZAAR Take 0.5 tablets (12.5 mg total) by mouth daily.   metoprolol tartrate 50 MG tablet Commonly known as: LOPRESSOR Take 50 mg by mouth 2 (two) times daily.   mirtazapine 7.5 MG tablet Commonly known as: REMERON Take 7.5 mg by mouth at bedtime.   pantoprazole 40 MG tablet Commonly known as: Protonix Take 1 tablet (40 mg total) by mouth 2 (two) times daily.   rosuvastatin 20 MG tablet Commonly known as: CRESTOR Take 1 tablet (20 mg total) by mouth every evening.   zinc sulfate 220 (50 Zn) MG capsule Take 1 capsule (220 mg total) by mouth daily.         Contact  information for follow-up providers     [name], [name], MD. Schedule an appointment as soon as possible for a visit in 1 week(s).   Specialty: Internal Medicine Contact information: [place] [address] [phone]         Donato Heinz, MD. Schedule an appointment as soon as possible for a visit in 2 week(s).   Specialties: Cardiology, Radiology Contact information: [address] [place] [address] [phone]  Contact information for after-discharge care     [facility] .   Service: Skilled Nursing Contact information: [address] [place] [phone]                     Major procedures and Radiology Reports - PLEASE review detailed and final reports thoroughly  -     Overnight EEG with video  Result Date: 06/12/2022 Lora Havens, MD     06/13/2022  8:13 AM Patient Name: ANTONIETA PARAMO MRN: [id number] Epilepsy Attending: Lora Havens Referring Physician/Provider: Amie Portland, MD Duration: 06/12/2022 0448 to 06/13/2022 0448  Patient history: 71yo F with seizure like activity. EEG to evaluate for seizure  Level of alertness: lethargic , asleep  AEDs during EEG study: None  Technical aspects: This EEG study was done with scalp electrodes positioned according to the 10-20 International system of electrode placement. Electrical activity was reviewed with band pass filter of 1-70Hz , sensitivity of 7 uV/mm, display speed of 44mm/sec with a 60Hz  notched filter applied as appropriate. EEG data were recorded continuously and digitally stored.  Video monitoring was available and reviewed as appropriate.  Description: No posterior dominant rhythm was seen. Sleep was characterized by vertex waves, sleep spindles (12 to 14hz ), maximal frontocentral region. EEG showed continuous generalized polymorphic 3 to 6 Hz  theta-delta slowing. Generalized periodic discharges with triphasic morphology at 1-1.5 Hz were also noted, more prominent when awake/stimulated. Hyperventilation and photic stimulation were not performed.    ABNORMALITY - Periodic discharges with triphasic morphology, generalized ( GPDs) - Continuous slow, generalized  IMPRESSION: This study showed generalized periodic discharges with triphasic morphology which can be on the ictal-interictal continuum. However, the morphology, frequency and reactivity to stimulation is more commonly indicative of toxic-metabolic causes. Additionally there was  moderate diffuse encephalopathy, nonspecific etiology. No seizures were seen throughout the recording.  Lora Havens   DG CHEST PORT 1 VIEW  Result Date: 06/12/2022 CLINICAL DATA:  Shortness of breath EXAM: PORTABLE CHEST 1 VIEW COMPARISON:  06/11/2022 FINDINGS: Similar hyperinflation suggesting background COPD/emphysema. Stable heart size and vascular congestion. Similar small posterior layering right effusion and right base atelectasis pattern. Stable left lung aeration. Trachea midline. Aorta atherosclerotic. Skin fold overlies the right chest. 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 Electronically Signed   By: Jerilynn Mages.  Shick M.D.   On: 06/12/2022 06:57   DG Chest 1 View  Result Date: 06/11/2022 CLINICAL DATA:  Shortness of breath.  Code stroke. EXAM: CHEST  1 VIEW COMPARISON:  06/07/2022 FINDINGS: Lungs are hyperexpanded. Bullous changes noted right upper lobe, as before. Left PICC line tip overlies the distal SVC level. Interstitial markings are diffusely coarsened with chronic features. Interval development of streaky opacity at the right base, likely atelectatic with probable tiny right pleural effusion. Prominent skin folds overlie the lateral left lung. Telemetry leads overlie the chest. IMPRESSION: Interval development  of streaky opacity at the right base, likely atelectatic with probable tiny right pleural effusion. Electronically Signed   By: Misty Stanley M.D.   On: 06/11/2022 06:36   MR BRAIN WO CONTRAST  Result Date: 06/10/2022 CLINICAL DATA:  Altered mental status stroke suspected EXAM: MRI HEAD WITHOUT CONTRAST MRA HEAD WITHOUT CONTRAST MRA NECK WITHOUT CONTRAST TECHNIQUE: Multiplanar, multi-echo pulse sequences of the brain and surrounding structures were acquired without intravenous contrast. Angiographic images of the Circle of Willis were acquired  using MRA technique without intravenous contrast. Angiographic images of the neck were acquired using MRA technique without intravenous contrast. Carotid stenosis measurements (when applicable) are obtained utilizing NASCET criteria, using the distal internal carotid diameter as the denominator. COMPARISON:  No prior MRI or MRA, correlation is made with CT head 06/10/2022 FINDINGS: Evaluation is limited by motion artifact. MRI HEAD FINDINGS Brain: No restricted diffusion to suggest acute or subacute infarct. Remaining sequences are severely motion limited, but no definite hemorrhage, mass, mass effect, or midline shift. No definite hydrocephalus or extra-axial collection. Vascular: Please see MRA findings below. Skull and upper cervical spine: Very motion limited. Sinuses/Orbits: No acute finding. Other: Fluid in the mastoid air cells bilaterally. MRA HEAD FINDINGS Evaluation is significantly limited by motion. Flow is grossly seen in the intracranial internal carotid arteries and proximal middle and anterior cerebral arteries. Flow is seen in the distal ACAs and mid to distal left and mid right MCA branches. Flow is grossly seen in the vertebral arteries, basilar artery and some of the posterior cerebral artery branches. MRA NECK FINDINGS Evaluation is somewhat motion limited. Standard aortic branching without evidence aneurysm or dissection. There does not appear to be  hemodynamically significant stenosis at the ICA bifurcations. The extracranial vertebral arteries appear patent throughout their course. IMPRESSION: 1. Evaluation of the brain is significantly limited by motion artifact. The diffusion-weighted sequence is less motion sensitive and, on this sequence, there is no evidence of acute or subacute infarct. 2. Evaluation of the intracranial vasculature is significantly limited by motion. Flow is grossly seen in the proximal intracranial vessels, but a detailed description of stenosis is not possible. 3. Evaluation of the neck vasculature is limited by motion. Within this limitation, no hemodynamically significant stenosis in the neck. Electronically Signed   By: Wiliam Ke M.D.   On: 06/10/2022 18:38   MR ANGIO HEAD WO CONTRAST  Result Date: 06/10/2022 CLINICAL DATA:  Altered mental status stroke suspected EXAM: MRI HEAD WITHOUT CONTRAST MRA HEAD WITHOUT CONTRAST MRA NECK WITHOUT CONTRAST TECHNIQUE: Multiplanar, multi-echo pulse sequences of the brain and surrounding structures were acquired without intravenous contrast. Angiographic images of the Circle of Willis were acquired using MRA technique without intravenous contrast. Angiographic images of the neck were acquired using MRA technique without intravenous contrast. Carotid stenosis measurements (when applicable) are obtained utilizing NASCET criteria, using the distal internal carotid diameter as the denominator. COMPARISON:  No prior MRI or MRA, correlation is made with CT head 06/10/2022 FINDINGS: Evaluation is limited by motion artifact. MRI HEAD FINDINGS Brain: No restricted diffusion to suggest acute or subacute infarct. Remaining sequences are severely motion limited, but no definite hemorrhage, mass, mass effect, or midline shift. No definite hydrocephalus or extra-axial collection. Vascular: Please see MRA findings below. Skull and upper cervical spine: Very motion limited. Sinuses/Orbits: No acute  finding. Other: Fluid in the mastoid air cells bilaterally. MRA HEAD FINDINGS Evaluation is significantly limited by motion. Flow is grossly seen in the intracranial internal carotid arteries and proximal middle and anterior cerebral arteries. Flow is seen in the distal ACAs and mid to distal left and mid right MCA branches. Flow is grossly seen in the vertebral arteries, basilar artery and some of the posterior cerebral artery branches. MRA NECK FINDINGS Evaluation is somewhat motion limited. Standard aortic branching without evidence aneurysm or dissection. There does not appear to be hemodynamically significant stenosis at the ICA bifurcations. The extracranial vertebral arteries appear patent throughout their course. IMPRESSION: 1. Evaluation of the brain is significantly  limited by motion artifact. The diffusion-weighted sequence is less motion sensitive and, on this sequence, there is no evidence of acute or subacute infarct. 2. Evaluation of the intracranial vasculature is significantly limited by motion. Flow is grossly seen in the proximal intracranial vessels, but a detailed description of stenosis is not possible. 3. Evaluation of the neck vasculature is limited by motion. Within this limitation, no hemodynamically significant stenosis in the neck. Electronically Signed   By: Merilyn Baba M.D.   On: 06/10/2022 18:38   MR ANGIO NECK WO CONTRAST  Result Date: 06/10/2022 CLINICAL DATA:  Altered mental status stroke suspected EXAM: MRI HEAD WITHOUT CONTRAST MRA HEAD WITHOUT CONTRAST MRA NECK WITHOUT CONTRAST TECHNIQUE: Multiplanar, multi-echo pulse sequences of the brain and surrounding structures were acquired without intravenous contrast. Angiographic images of the Circle of Willis were acquired using MRA technique without intravenous contrast. Angiographic images of the neck were acquired using MRA technique without intravenous contrast. Carotid stenosis measurements (when applicable) are obtained  utilizing NASCET criteria, using the distal internal carotid diameter as the denominator. COMPARISON:  No prior MRI or MRA, correlation is made with CT head 06/10/2022 FINDINGS: Evaluation is limited by motion artifact. MRI HEAD FINDINGS Brain: No restricted diffusion to suggest acute or subacute infarct. Remaining sequences are severely motion limited, but no definite hemorrhage, mass, mass effect, or midline shift. No definite hydrocephalus or extra-axial collection. Vascular: Please see MRA findings below. Skull and upper cervical spine: Very motion limited. Sinuses/Orbits: No acute finding. Other: Fluid in the mastoid air cells bilaterally. MRA HEAD FINDINGS Evaluation is significantly limited by motion. Flow is grossly seen in the intracranial internal carotid arteries and proximal middle and anterior cerebral arteries. Flow is seen in the distal ACAs and mid to distal left and mid right MCA branches. Flow is grossly seen in the vertebral arteries, basilar artery and some of the posterior cerebral artery branches. MRA NECK FINDINGS Evaluation is somewhat motion limited. Standard aortic branching without evidence aneurysm or dissection. There does not appear to be hemodynamically significant stenosis at the ICA bifurcations. The extracranial vertebral arteries appear patent throughout their course. IMPRESSION: 1. Evaluation of the brain is significantly limited by motion artifact. The diffusion-weighted sequence is less motion sensitive and, on this sequence, there is no evidence of acute or subacute infarct. 2. Evaluation of the intracranial vasculature is significantly limited by motion. Flow is grossly seen in the proximal intracranial vessels, but a detailed description of stenosis is not possible. 3. Evaluation of the neck vasculature is limited by motion. Within this limitation, no hemodynamically significant stenosis in the neck. Electronically Signed   By: Merilyn Baba M.D.   On: 06/10/2022 18:38    EEG adult  Result Date: 06/10/2022 Lora Havens, MD     06/10/2022  5:15 PM Patient Name: ANNAKAY JASMIN MRN: [id number] Epilepsy Attending: Lora Havens Referring Physician/Provider: Rolla Plate, DO Date: 06/10/2022 Duration: 21.57 mins Patient history: 71yo F with seizure like activity. EEG to evaluate for seizure Level of alertness: lethargic AEDs during EEG study: Ativan Technical aspects: This EEG study was done with scalp electrodes positioned according to the 10-20 International system of electrode placement. Electrical activity was reviewed with band pass filter of 1-70Hz , sensitivity of 7 uV/mm, display speed of 66mm/sec with a 60Hz  notched filter applied as appropriate. EEG data were recorded continuously and digitally stored.  Video monitoring was available and reviewed as appropriate. Description: No posterior dominant rhythm was seen. EEG showed continuous generalized polymorphic  3 to 6 Hz theta-delta slowing. Generalized periodic discharges with triphasic morphology at 1-1.5 Hz were also noted, more prominent when awake/stimulated. Hyperventilation and photic stimulation were not performed.   ABNORMALITY - Periodic discharges with triphasic morphology, generalized ( GPDs) - Continuous slow, generalized IMPRESSION: This study showed generalized periodic discharges with triphasic morphology which can be on the ictal-interictal continuum. However, the morphology, frequency and reactivity to stimulation is more commonly indicative of toxic-metabolic causes. Additionally there was  moderate diffuse encephalopathy, nonspecific etiology. No seizures were seen throughout the recording. [place]   CT HEAD WO CONTRAST (5MM)  Result Date: 06/10/2022 CLINICAL DATA:  Altered mental status EXAM: CT HEAD WITHOUT CONTRAST TECHNIQUE: Contiguous axial images were obtained from the base of the skull through the vertex without intravenous contrast. RADIATION DOSE REDUCTION: This exam  was performed according to the departmental dose-optimization program which includes automated exposure control, adjustment of the mA and/or kV according to patient size and/or use of iterative reconstruction technique. COMPARISON:  None Available. FINDINGS: Brain: No evidence of hemorrhage, hydrocephalus, extra-axial collection or mass lesion/mass effect. There is extensive subcortical hypodensity in the bilateral cerebral hemispheres. There are age-indeterminate infarcts in the right cerebellum (series 3, image 4), left temporal lobe (series 3, image 8-9), the corona radiata on the left (series 3, image 16) and possibly the left lentiform nucleus (series 13, image 30). Vascular: No hyperdense vessel or unexpected calcification. Skull: Pneumatization of the right petrous apex. Sinuses/Orbits: Bilateral lens replacements. Assessment of the bilateral paranasal sinuses is limited due to the degree of motion artifact. Other: None. IMPRESSION: 1. There are multiple age-indeterminate infratentorial and bilateral supratentorial cortical hypodensities, worrisome for age indeterminate infarcts. Recommend further evaluation with a brain MRI. 2. There is extensive subcortical hypodensity, which is nonspecific, and may represent sequela of chronic microvascular ischemic change. This can also be assessed on follow up brian MRI. Electronically Signed   By: Marin Roberts M.D.   On: 06/10/2022 15:42   DG Chest Port 1 View  Result Date: 06/07/2022 CLINICAL DATA:  Acute respiratory failure with hypoxia and hypercarbia. EXAM: PORTABLE CHEST 1 VIEW COMPARISON:  06/03/2022. FINDINGS: The heart size and mediastinal contours are stable. There is atherosclerotic calcification of the aorta. Emphysematous changes are noted in the lungs. Coarse interstitial markings are present bilaterally and likely chronic. No consolidation, effusion, or pneumothorax. No acute osseous abnormality. IMPRESSION: 1. No active disease. 2. Emphysema.  Electronically Signed   By: Brett Fairy M.D.   On: 06/07/2022 22:07   [place] EKG SITE RITE  Result Date: 06/04/2022 If Site Rite image not attached, placement could not be confirmed due to current cardiac rhythm.  [place] EKG SITE RITE  Result Date: 06/04/2022 If Site Rite image not attached, placement could not be confirmed due to current cardiac rhythm.  DG CHEST PORT 1 VIEW  Result Date: 06/03/2022 CLINICAL DATA:  NG tube placement.  Respiratory failure with hypoxia EXAM: PORTABLE CHEST 1 VIEW COMPARISON:  06/03/2022 FINDINGS: Endotracheal tube is 5.8 cm above the carina. NG tube enters the stomach. Heart is normal size. Increased markings in the lung bases likely reflects scarring. No acute confluent opacities or effusions. Emphysematous changes. No acute bony abnormality. IMPRESSION: COPD. Bibasilar scarring. Support devices as above. Electronically Signed   By: Rolm Baptise M.D.   On: 06/03/2022 22:37   DG Chest Port 1 View  Result Date: 06/03/2022 CLINICAL DATA:  Acute respiratory failure with hypoxia EXAM: PORTABLE CHEST 1 VIEW COMPARISON:  06/01/2022 FINDINGS: Endotracheal  and enteric tubes are again identified. Enteric tube has likely retracted some given location of side port. 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 Similar left greater than right interstitial changes. No significant pleural effusion. No pneumothorax. Stable cardiomediastinal contours. IMPRESSION: Similar left greater than right patchy opacities superimposed on emphysema. Enteric tube has likely retracted as side port is now visible just below the gastroesophageal junction. Electronically Signed   By: Macy Mis M.D.   On: 06/03/2022 08:19   DG CHEST PORT 1 VIEW  Result Date: 06/01/2022 CLINICAL DATA:  Pneumonia EXAM: PORTABLE CHEST 1 VIEW COMPARISON:  Previous studies including the examination of 05/31/2022 FINDINGS: Tip of endotracheal tube is 7 cm above the carina. Enteric tube is noted traversing the esophagus.  Cardiac size is within normal limits. There is slight improvement in infiltrates in left lung. There is possible slight worsening of infiltrate in right parahilar region. Bullous emphysema is seen in right upper lung field limiting evaluation for small pneumothorax. There is no significant pleural effusion. No definite pneumothorax is seen. IMPRESSION: There is decrease in extensive interstitial infiltrates in left lung. There is slight worsening of infiltrate in right parahilar region. Severe bullous emphysema in the right upper lung field. Electronically Signed   By: Elmer Picker M.D.   On: 06/01/2022 09:17   ECHOCARDIOGRAM LIMITED  Result Date: 05/31/2022    ECHOCARDIOGRAM LIMITED REPORT   Patient Name:   TARENA GOCKLEY Date of Exam: 05/31/2022 Medical Rec #:  [id number]         Height:       60.0 in Accession #:    [id number]        Weight:       90.2 lb Date of Birth:  May 08, 1951         BSA:          1.330 m Patient Age:    35 years          BP:           125/55 mmHg Patient Gender: F                 HR:           101 bpm. Exam Location:  [facility] Procedure: Limited Echo Indications:    CHF-Acute Systolic C16.60  History:        Patient has prior history of Echocardiogram examinations, most                 recent 05/29/2022. Previous Myocardial Infarction and CAD, COPD                 and Stroke; Risk Factors:Hypertension and Dyslipidemia. Hx of                 CKD, COVID-10.  Sonographer:    Alvino Chapel RCS Referring Phys: [phone] DAVID TAT  Sonographer Comments: *Patient on mechanical ventilator at present. IMPRESSIONS  1. Left ventricular ejection fraction, by estimation, is 35 to 40%. The left ventricle has moderately decreased function. The left ventricle demonstrates regional wall motion abnormalities (see scoring diagram/findings for description). The mid septal and all apical LV segments are akinetic (consistent with prior TTE).  2. Right ventricular systolic function is mildly reduced. The  right ventricular size is normal.  3. The mitral valve is degenerative. Moderate mitral annular calcification. Comparison(s): Compared to prior TTE on 05/29/22, there is no significant change. FINDINGS  Left Ventricle: The mid septal and all apical LV segments are  akinetic. Left ventricular ejection fraction, by estimation, is 35 to 40%. The left ventricle has moderately decreased function. The left ventricle demonstrates regional wall motion abnormalities. The left ventricular internal cavity size was normal in size. There is no left ventricular hypertrophy. Right Ventricle: The right ventricular size is normal. Right ventricular systolic function is mildly reduced. Pericardium: There is no evidence of pericardial effusion. Mitral Valve: The mitral valve is degenerative in appearance. There is mild thickening of the mitral valve leaflet(s). There is mild calcification of the mitral valve leaflet(s). Moderate mitral annular calcification. Aorta: The aortic root is normal in size and structure. LEFT VENTRICLE PLAX 2D LVIDd:         3.90 cm LVIDs:         3.30 cm LV PW:         0.90 cm LV IVS:        0.90 cm LVOT diam:     1.70 cm LVOT Area:     2.27 cm  LV Volumes (MOD) LV vol d, MOD A2C: 38.7 ml LV vol d, MOD A4C: 50.3 ml LV vol s, MOD A2C: 21.4 ml LV vol s, MOD A4C: 26.8 ml LV SV MOD A2C:     17.3 ml LV SV MOD A4C:     50.3 ml LV SV MOD BP:      20.7 ml LEFT ATRIUM         Index LA diam:    3.20 cm 2.41 cm/m   AORTA Ao Root diam: 2.50 cm  SHUNTS Systemic Diam: 1.70 cm Gwyndolyn Kaufman MD Electronically signed by Gwyndolyn Kaufman MD Signature Date/Time: 05/31/2022/1:31:58 PM    Final    DG Chest 1 View  Result Date: 05/31/2022 CLINICAL DATA:  Shortness of breath EXAM: CHEST  1 VIEW COMPARISON:  05/30/2022 FINDINGS: Endotracheal tube terminates 7 cm above the carina. Multifocal patchy left lung opacities, favoring pneumonia, possibly on the basis of aspiration. Right lung is essentially clear. No pleural  effusion or pneumothorax. The heart is normal in size. Enteric tube courses into the stomach. Cholecystectomy clips. IMPRESSION: Endotracheal tube terminates 7 cm above the carina. Multifocal patchy left lung opacities, favoring pneumonia, possibly on the basis of aspiration. Electronically Signed   By: Julian Hy M.D.   On: 05/31/2022 03:54   DG CHEST PORT 1 VIEW  Result Date: 05/30/2022 CLINICAL DATA:  Endotracheal and orogastric tube. EXAM: PORTABLE CHEST 1 VIEW COMPARISON:  Chest CT 05/29/2022.  Chest x-ray 05/30/2009 3. FINDINGS: Endotracheal tube tip is 5 cm above the carina. Enteric tube tip is in the mid stomach. There are increasing central interstitial and airspace opacities throughout the left lung. Severe emphysematous changes are again seen in the right upper lobe. There is no pleural effusion or pneumothorax. No acute fractures are seen. Cardiomediastinal silhouette is within normal limits. IMPRESSION: 1. Endotracheal tube tip 5 cm above carina. 2. Enteric tube tip at the level of the mid stomach. 3. Increasing left lung interstitial and airspace opacities which may represent edema or infection. 4. Stable severe emphysema. Electronically Signed   By: Ronney Asters M.D.   On: 05/30/2022 19:50   DG CHEST PORT 1 VIEW  Result Date: 05/30/2022 CLINICAL DATA:  Respiratory failure, pneumonia EXAM: PORTABLE CHEST 1 VIEW COMPARISON:  Previous studies including chest radiograph done on 05/28/2022 and CT done on 05/29/2022 FINDINGS: There is diffuse increase in patchy interstitial and alveolar densities in both lungs, more so on the left side. There is relative sparing of right  upper lung field due to underlying bullous emphysema. There is blunting of both lateral CP angles. There is no pneumothorax. IMPRESSION: There is diffuse increase in interstitial and alveolar markings in both lungs, more so on the left side. Findings suggest asymmetric pulmonary edema or worsening multifocal bilateral  pneumonia. Small bilateral pleural effusions. Electronically Signed   By: Elmer Picker M.D.   On: 05/30/2022 10:05   ECHOCARDIOGRAM COMPLETE  Result Date: 05/29/2022    ECHOCARDIOGRAM REPORT   Patient Name:   LERONDA MERCHEN Date of Exam: 05/29/2022 Medical Rec #:  [id number]         Height:       60.0 in Accession #:    [id number]        Weight:       88.2 lb Date of Birth:  09-19-1951         BSA:          1.318 m Patient Age:    89 years          BP:           143/66 mmHg Patient Gender: F                 HR:           113 bpm. Exam Location:  [facility] Procedure: 2D Echo, Cardiac Doppler and Color Doppler Indications:    Elevated Troponin  History:        Patient has prior history of Echocardiogram examinations, most                 recent 04/14/2018. CAD and Previous Myocardial Infarction, COPD                 and Stroke; Risk Factors:Hypertension and Dyslipidemia. Hx of                 CKD, COVID-10.  Sonographer:    Alvino Chapel RCS Referring Phys: [phone] DAVID TAT  Sonographer Comments: ** Patient on BiPAP and moving Alot during echo. IMPRESSIONS  1. Left ventricular ejection fraction, by estimation, is 35 to 40%. The left ventricle has moderately decreased function. The left ventricle demonstrates regional wall motion abnormalities (see scoring diagram/findings for description). Left ventricular  diastolic parameters are indeterminate.  2. Right ventricular systolic function is mildly reduced. The right ventricular size is normal. There is moderately elevated pulmonary artery systolic pressure. The estimated right ventricular systolic pressure is 99991111 mmHg.  3. The mitral valve is degenerative. Trivial mitral valve regurgitation.  4. The aortic valve was not well visualized. Aortic valve regurgitation is not visualized. No aortic stenosis is present.  5. The inferior vena cava is dilated in size with <50% respiratory variability, suggesting right atrial pressure of 15 mmHg.  Conclusion(s)/Recommendation(s): Normal systolic function at base with apical akinesis, differential includes LAD infarct vs stress induced cardiomyopathy. FINDINGS  Left Ventricle: Left ventricular ejection fraction, by estimation, is 35 to 40%. The left ventricle has moderately decreased function. The left ventricle demonstrates regional wall motion abnormalities. The left ventricular internal cavity size was normal in size. There is no left ventricular hypertrophy. Left ventricular diastolic parameters are indeterminate.  LV Wall Scoring: The mid and distal anterior septum, entire apex, and mid inferoseptal segment are akinetic. The anterior wall, antero-lateral wall, inferior wall, posterior wall, basal anteroseptal segment, and basal inferoseptal segment are normal. Right Ventricle: The right ventricular size is normal. Right vetricular wall thickness was not well visualized. Right ventricular systolic function  is mildly reduced. There is moderately elevated pulmonary artery systolic pressure. The tricuspid regurgitant velocity is 3.00 m/s, and with an assumed right atrial pressure of 15 mmHg, the estimated right ventricular systolic pressure is 99991111 mmHg. Left Atrium: Left atrial size was normal in size. Right Atrium: Right atrial size was normal in size. Pericardium: There is no evidence of pericardial effusion. Mitral Valve: The mitral valve is degenerative in appearance. Trivial mitral valve regurgitation. Tricuspid Valve: The tricuspid valve is normal in structure. Tricuspid valve regurgitation is trivial. Aortic Valve: The aortic valve was not well visualized. Aortic valve regurgitation is not visualized. No aortic stenosis is present. Pulmonic Valve: The pulmonic valve was not well visualized. Pulmonic valve regurgitation is not visualized. Aorta: The aortic root is normal in size and structure. Venous: The inferior vena cava is dilated in size with less than 50% respiratory variability, suggesting right  atrial pressure of 15 mmHg. IAS/Shunts: The interatrial septum was not well visualized.  LEFT VENTRICLE PLAX 2D LVIDd:         3.60 cm LVIDs:         2.50 cm LV PW:         0.90 cm LV IVS:        0.90 cm LVOT diam:     1.70 cm LV SV:         37 LV SV Index:   28 LVOT Area:     2.27 cm  LV Volumes (MOD) LV vol d, MOD A2C: 51.2 ml LV vol d, MOD A4C: 57.0 ml LV vol s, MOD A2C: 30.1 ml LV vol s, MOD A4C: 32.9 ml LV SV MOD A2C:     21.1 ml LV SV MOD A4C:     57.0 ml LV SV MOD BP:      24.6 ml RIGHT VENTRICLE TAPSE (M-mode): 1.2 cm LEFT ATRIUM             Index        RIGHT ATRIUM           Index LA diam:        2.60 cm 1.97 cm/m   RA Area:     10.40 cm LA Vol (A2C):   32.4 ml 24.58 ml/m  RA Volume:   25.30 ml  19.20 ml/m LA Vol (A4C):   32.5 ml 24.66 ml/m LA Biplane Vol: 35.2 ml 26.71 ml/m  AORTIC VALVE LVOT Vmax:   88.30 cm/s LVOT Vmean:  55.300 cm/s LVOT VTI:    0.164 m  AORTA Ao Root diam: 2.50 cm MITRAL VALVE                TRICUSPID VALVE MV Area (PHT): 7.16 cm     TR Peak grad:   36.0 mmHg MV Decel Time: 106 msec     TR Vmax:        300.00 cm/s MV E velocity: 136.00 cm/s                             SHUNTS                             Systemic VTI:  0.16 m                             Systemic Diam: 1.70 cm Oswaldo Milian MD Electronically signed by Harrell Gave  Gardiner Rhyme MD Signature Date/Time: 05/29/2022/2:52:39 PM    Final    CT Angio Chest Pulmonary Embolism (PE) W or WO Contrast  Result Date: 05/29/2022 CLINICAL DATA:  Shortness of breath x2 weeks EXAM: CT ANGIOGRAPHY CHEST WITH CONTRAST TECHNIQUE: Multidetector CT imaging of the chest was performed using the standard protocol during bolus administration of intravenous contrast. Multiplanar CT image reconstructions and MIPs were obtained to evaluate the vascular anatomy. RADIATION DOSE REDUCTION: This exam was performed according to the departmental dose-optimization program which includes automated exposure control, adjustment of the mA and/or kV  according to patient size and/or use of iterative reconstruction technique. CONTRAST:  73mL OMNIPAQUE IOHEXOL 350 MG/ML SOLN COMPARISON:  Previous studies including the chest radiograph done on 05/28/2022 FINDINGS: Cardiovascular: There is homogeneous enhancement in thoracic aorta. There are no intraluminal filling defects in central pulmonary artery branches. There are scattered coronary artery calcifications. Mediastinum/Nodes: No significant lymphadenopathy seen. There are small pockets of air in right subclavian vein, possibly introduced during venipuncture. Lungs/Pleura: Centrilobular and panlobular emphysema is seen. 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 There is small patchy infiltrate in the lateral aspect of posterior segment of right upper lobe. There is thickening of interlobular septi in the lower lung fields. There are patchy infiltrates in right middle lobe and both lower lobes. Small right pleural effusion is seen. There is minimal left pleural effusion. There is no pneumothorax. Upper Abdomen: There is 5.1 cm smooth marginated fluid density lesion in the upper pole of left kidney suggesting possible cyst. No follow-up is recommended. There are a few low-density lesions in liver measuring up to 11 mm in size which are not fully characterized, most likely cysts. No follow-up imaging is recommended. Musculoskeletal: No acute findings are seen. Review of the MIP images confirms the above findings. IMPRESSION: There is no evidence of pulmonary artery embolism. There is no evidence of thoracic aortic dissection. Scattered coronary artery calcifications are seen. 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 There are small patchy infiltrates in posterior segment of right upper lobe, right middle lobe and both lower lobes suggesting multifocal atelectasis/pneumonia. Bilateral pleural effusions, more so on the right side. There are low-density lesions in left kidney and liver,  possibly cysts. No follow-up imaging is recommended. Electronically Signed   By: Elmer Picker M.D.   On: 05/29/2022 13:04   DG Chest Portable 1 View  Result Date: 05/28/2022 CLINICAL DATA:  Shortness of breath for 2 weeks.  COVID positive. EXAM: PORTABLE CHEST 1 VIEW COMPARISON:  05/02/2022 FINDINGS: Lungs are hyperinflated. Significant bullous changes at the RIGHT lung apex, similar to prior. There are no focal consolidations or pleural effusions. No pulmonary edema. IMPRESSION: Hyperinflation of the lungs. No evidence for acute pulmonary abnormality. Electronically Signed   By: Nolon Nations M.D.   On: 05/28/2022 17:12    Micro Results    Recent Results (from the past 240 hour(s))  Culture, blood (Routine X 2) w Reflex to ID Panel     Status: None   Collection Time: 06/11/22  5:09 PM   Specimen: Right Antecubital; Blood  Result Value Ref Range Status   Specimen Description RIGHT ANTECUBITAL  Final   Special Requests   Final    BOTTLES DRAWN AEROBIC AND ANAEROBIC Blood Culture adequate volume   Culture   Final    NO GROWTH 5 DAYS Performed at [facility], [address], [place], [place] [address]    Report Status 06/16/2022 FINAL  Final  Culture, blood (Routine X 2) w Reflex to ID Panel     Status: None   Collection Time: 06/11/22  5:14 PM   Specimen: BLOOD  Result Value Ref Range Status   Specimen Description BLOOD  Final   Special Requests BLOOD  Final   Culture   Final    NO GROWTH 5 DAYS Performed at [facility], [address] [place], [place] [address]    Report Status 06/16/2022 FINAL  Final    Today   Subjective    Volena Dilday today has no headache,no chest abdominal pain,no new weakness tingling or numbness, feels much better   Objective   Blood pressure 139/70, pulse (!) 106, temperature 98.2 F (36.8 C), resp. rate 18, height 5' (1.524 m), weight 50.5 kg, SpO2 100 %.   Intake/Output Summary (Last 24 hours) at 06/17/2022 0957 Last data filed  at 06/16/2022 1130 Gross per 24 hour  Intake 237 ml  Output 375 ml  Net -138 ml    Exam  Awake Alert, No new F.N deficits, overall quite frail with generalized weakness [place].AT,PERRAL Supple Neck,   Symmetrical Chest wall movement, Good air movement bilaterally, CTAB RRR,No Gallops,   +ve B.Sounds, Abd Soft, Non tender,  No Cyanosis, Clubbing or edema    Data Review   Recent Labs  Lab 06/11/22 0624 06/12/22 0243 06/13/22 0345 06/14/22 0600  WBC 13.5* 15.2* 10.5 8.5  HGB 9.2* 12.2 9.9* 9.3*  HCT 29.3* 37.1 30.4* 28.5*  PLT 87* 86* 88* 89*  MCV 111.4* 107.5* 108.2* 107.1*  MCH 35.0* 35.4* 35.2* 35.0*  MCHC 31.4 32.9 32.6 32.6  RDW 15.8* 15.3 14.9 15.2  LYMPHSABS 1.3 1.3 1.1 1.0  MONOABS 1.0 1.0 0.9 0.9  EOSABS 0.0 0.0 0.0 0.0  BASOSABS 0.0 0.0 0.0 0.0    Recent Labs  Lab 06/10/22 1924 06/11/22 0624 06/11/22 0624 06/12/22 0243 06/13/22 0345 06/14/22 0600 06/15/22 0250 06/16/22 0413  NA  --  136   < > 140 138 138 139 140  K  --  4.3   < > 3.9 3.0* 3.2* 3.2* 3.7  CL  --  99   < > 100 99 102 103 104  CO2  --  31   < > 29 29 29 30 31   GLUCOSE  --  334*   < > 113* 221* 124* 104* 106*  BUN  --  10   < > 9 6* 7* 7* 5*  CREATININE  --  0.88   < > 0.92 0.82 0.88 0.86 0.71  CALCIUM  --  7.7*   < > 8.8* 8.7* 8.5* 8.2* 8.6*  AST  --  34  --  38 32 31  --   --   ALT  --  70*  --  69* 52* 43  --   --   ALKPHOS  --  49  --  60 54 51  --   --   BILITOT  --  1.2  --  1.2 1.3* 1.2  --   --   ALBUMIN  --  2.3*  --  2.6* 2.3* 2.2*  --   --   MG  --   --   --   --   --  1.6* 2.1  --   PHOS  --  2.4*  --   --   --   --   --   --   PROCALCITON  --  0.21  --   --   --   --   --   --  AMMONIA 27  --   --   --   --   --   --   --    < > = values in this interval not displayed.    Total Time in preparing paper work, data evaluation and todays exam - 80 minutes  Lala Lund M.D on 06/17/2022 at 9:57 AM  [facility]

## 2022-06-17 NOTE — Discharge Instructions (Signed)
Follow with Primary MD Carrolyn Meiers, MD in 7 days   Get CBC, CMP, 2 view Chest X ray -  checked next visit within 1 week by SNF MD    Activity: As tolerated with Full fall precautions use walker/cane & assistance as needed  Disposition SNF  Diet: Dysphagia 2 diet with feeding assistance and aspiration precautions.  Special Instructions: If you have smoked or chewed Tobacco  in the last 2 yrs please stop smoking, stop any regular Alcohol  and or any Recreational drug use.  On your next visit with your primary care physician please Get Medicines reviewed and adjusted.  Please request your Prim.MD to go over all Hospital Tests and Procedure/Radiological results at the follow up, please get all Hospital records sent to your Prim MD by signing hospital release before you go home.  If you experience worsening of your admission symptoms, develop shortness of breath, life threatening emergency, suicidal or homicidal thoughts you must seek medical attention immediately by calling 911 or calling your MD immediately  if symptoms less severe.  You Must read complete instructions/literature along with all the possible adverse reactions/side effects for all the Medicines you take and that have been prescribed to you. Take any new Medicines after you have completely understood and accpet all the possible adverse reactions/side effects.

## 2022-06-17 NOTE — Consult Note (Signed)
   [facility] Inpatient Consult   06/17/2022  [name] 04-22-1951 [id number]  Follow up: [facility] review -  [employer]  Patient's electronic medical record was reviewed for noted disposition.  Patient for a short term rehab at a skilled nursing facility currently at [facility].   Plan:  Needs for transition are to be met at a SNF level of care.  [name], RN BSN [name]  [phone] business mobile phone Toll free office [phone]  *[place]  [phone] Fax number: [phone] [name]

## 2022-06-17 NOTE — Progress Notes (Signed)
Report given to Polina LPN at [facility] reflecting patient's current status.

## 2022-06-17 NOTE — TOC Transition Note (Signed)
Transition of Care [facility]) - CM/SW Discharge Note   Patient Details  Name: [name] MRN: [id number] Date of Birth: Aug 10, 1951  Transition of Care [facility]) CM/SW Contact:  Geralynn Ochs, LCSW Phone Number: 06/17/2022, 11:59 AM   Clinical Narrative:   CSW noting per chart review that patient medically stable for discharge. CSW sent discharge information to [facility], confirmed bed availability. CSW spoke with son, Elta Guadeloupe, to update on discharge, he is in agreement. Transport scheduled with [facility] for 3:00 PM.  Nurse to call report to [phone].    Final next level of care: Skilled Nursing Facility Barriers to Discharge: Barriers Resolved   Patient Goals and CMS Choice Patient states their goals for this hospitalization and ongoing recovery are:: return home      Discharge Placement              Patient chooses bed at:  [facility]) Patient to be transferred to facility by: [facility] Name of family member notified: Elta Guadeloupe Patient and family notified of of transfer: 06/17/22  Discharge Plan and Services                                     Social Determinants of Health (SDOH) Interventions     Readmission Risk Interventions     No data to display

## 2022-06-22 DIAGNOSIS — I10 Essential (primary) hypertension: Secondary | ICD-10-CM | POA: Diagnosis not present

## 2022-06-22 DIAGNOSIS — I63019 Cerebral infarction due to thrombosis of unspecified vertebral artery: Secondary | ICD-10-CM | POA: Diagnosis not present

## 2022-06-22 DIAGNOSIS — J9602 Acute respiratory failure with hypercapnia: Secondary | ICD-10-CM | POA: Diagnosis not present

## 2022-06-22 DIAGNOSIS — I2510 Atherosclerotic heart disease of native coronary artery without angina pectoris: Secondary | ICD-10-CM | POA: Diagnosis not present

## 2022-06-22 DIAGNOSIS — I5020 Unspecified systolic (congestive) heart failure: Secondary | ICD-10-CM | POA: Diagnosis not present

## 2022-06-22 DIAGNOSIS — J9601 Acute respiratory failure with hypoxia: Secondary | ICD-10-CM | POA: Diagnosis not present

## 2022-06-22 DIAGNOSIS — J449 Chronic obstructive pulmonary disease, unspecified: Secondary | ICD-10-CM | POA: Diagnosis not present

## 2022-06-24 DIAGNOSIS — J449 Chronic obstructive pulmonary disease, unspecified: Secondary | ICD-10-CM | POA: Diagnosis not present

## 2022-06-24 DIAGNOSIS — I63019 Cerebral infarction due to thrombosis of unspecified vertebral artery: Secondary | ICD-10-CM | POA: Diagnosis not present

## 2022-06-24 DIAGNOSIS — I5020 Unspecified systolic (congestive) heart failure: Secondary | ICD-10-CM | POA: Diagnosis not present

## 2022-06-24 DIAGNOSIS — I2489 Other forms of acute ischemic heart disease: Secondary | ICD-10-CM | POA: Diagnosis not present

## 2022-06-24 DIAGNOSIS — M6281 Muscle weakness (generalized): Secondary | ICD-10-CM | POA: Diagnosis not present

## 2022-06-24 DIAGNOSIS — L89150 Pressure ulcer of sacral region, unstageable: Secondary | ICD-10-CM | POA: Diagnosis not present

## 2022-06-24 DIAGNOSIS — J9602 Acute respiratory failure with hypercapnia: Secondary | ICD-10-CM | POA: Diagnosis not present

## 2022-06-24 DIAGNOSIS — J9601 Acute respiratory failure with hypoxia: Secondary | ICD-10-CM | POA: Diagnosis not present

## 2022-06-24 DIAGNOSIS — I2510 Atherosclerotic heart disease of native coronary artery without angina pectoris: Secondary | ICD-10-CM | POA: Diagnosis not present

## 2022-06-24 DIAGNOSIS — E46 Unspecified protein-calorie malnutrition: Secondary | ICD-10-CM | POA: Diagnosis not present

## 2022-06-24 DIAGNOSIS — R1310 Dysphagia, unspecified: Secondary | ICD-10-CM | POA: Diagnosis not present

## 2022-06-25 DIAGNOSIS — R918 Other nonspecific abnormal finding of lung field: Secondary | ICD-10-CM | POA: Diagnosis not present

## 2022-06-25 NOTE — Telephone Encounter (Signed)
Thanks Mindy, she was recently discharged from the hospital. Please set her up for a follow-up appointment with me, Dr. Abbey Chatters or any of the APPs. Thanks

## 2022-06-26 DIAGNOSIS — N1831 Chronic kidney disease, stage 3a: Secondary | ICD-10-CM | POA: Diagnosis not present

## 2022-06-26 DIAGNOSIS — J9601 Acute respiratory failure with hypoxia: Secondary | ICD-10-CM | POA: Diagnosis not present

## 2022-06-26 DIAGNOSIS — I10 Essential (primary) hypertension: Secondary | ICD-10-CM | POA: Diagnosis not present

## 2022-06-26 DIAGNOSIS — J9602 Acute respiratory failure with hypercapnia: Secondary | ICD-10-CM | POA: Diagnosis not present

## 2022-06-26 DIAGNOSIS — I5020 Unspecified systolic (congestive) heart failure: Secondary | ICD-10-CM | POA: Diagnosis not present

## 2022-06-26 DIAGNOSIS — J449 Chronic obstructive pulmonary disease, unspecified: Secondary | ICD-10-CM | POA: Diagnosis not present

## 2022-06-26 DIAGNOSIS — I679 Cerebrovascular disease, unspecified: Secondary | ICD-10-CM | POA: Diagnosis not present

## 2022-06-29 DIAGNOSIS — I10 Essential (primary) hypertension: Secondary | ICD-10-CM | POA: Diagnosis not present

## 2022-06-29 DIAGNOSIS — N1831 Chronic kidney disease, stage 3a: Secondary | ICD-10-CM | POA: Diagnosis not present

## 2022-06-29 DIAGNOSIS — I5020 Unspecified systolic (congestive) heart failure: Secondary | ICD-10-CM | POA: Diagnosis not present

## 2022-06-29 DIAGNOSIS — J9601 Acute respiratory failure with hypoxia: Secondary | ICD-10-CM | POA: Diagnosis not present

## 2022-06-29 DIAGNOSIS — J9602 Acute respiratory failure with hypercapnia: Secondary | ICD-10-CM | POA: Diagnosis not present

## 2022-06-29 DIAGNOSIS — J449 Chronic obstructive pulmonary disease, unspecified: Secondary | ICD-10-CM | POA: Diagnosis not present

## 2022-06-29 DIAGNOSIS — I679 Cerebrovascular disease, unspecified: Secondary | ICD-10-CM | POA: Diagnosis not present

## 2022-06-30 DIAGNOSIS — I509 Heart failure, unspecified: Secondary | ICD-10-CM | POA: Diagnosis not present

## 2022-07-01 DIAGNOSIS — N1831 Chronic kidney disease, stage 3a: Secondary | ICD-10-CM | POA: Diagnosis not present

## 2022-07-01 DIAGNOSIS — J9602 Acute respiratory failure with hypercapnia: Secondary | ICD-10-CM | POA: Diagnosis not present

## 2022-07-01 DIAGNOSIS — L89150 Pressure ulcer of sacral region, unstageable: Secondary | ICD-10-CM | POA: Diagnosis not present

## 2022-07-01 DIAGNOSIS — R1310 Dysphagia, unspecified: Secondary | ICD-10-CM | POA: Diagnosis not present

## 2022-07-01 DIAGNOSIS — J449 Chronic obstructive pulmonary disease, unspecified: Secondary | ICD-10-CM | POA: Diagnosis not present

## 2022-07-01 DIAGNOSIS — I10 Essential (primary) hypertension: Secondary | ICD-10-CM | POA: Diagnosis not present

## 2022-07-01 DIAGNOSIS — J9601 Acute respiratory failure with hypoxia: Secondary | ICD-10-CM | POA: Diagnosis not present

## 2022-07-01 DIAGNOSIS — M6281 Muscle weakness (generalized): Secondary | ICD-10-CM | POA: Diagnosis not present

## 2022-07-01 DIAGNOSIS — I5020 Unspecified systolic (congestive) heart failure: Secondary | ICD-10-CM | POA: Diagnosis not present

## 2022-07-01 DIAGNOSIS — I679 Cerebrovascular disease, unspecified: Secondary | ICD-10-CM | POA: Diagnosis not present

## 2022-07-01 DIAGNOSIS — E46 Unspecified protein-calorie malnutrition: Secondary | ICD-10-CM | POA: Diagnosis not present

## 2022-07-03 DIAGNOSIS — I10 Essential (primary) hypertension: Secondary | ICD-10-CM | POA: Diagnosis not present

## 2022-07-03 DIAGNOSIS — I679 Cerebrovascular disease, unspecified: Secondary | ICD-10-CM | POA: Diagnosis not present

## 2022-07-03 DIAGNOSIS — I509 Heart failure, unspecified: Secondary | ICD-10-CM | POA: Diagnosis not present

## 2022-07-03 DIAGNOSIS — N1831 Chronic kidney disease, stage 3a: Secondary | ICD-10-CM | POA: Diagnosis not present

## 2022-07-03 DIAGNOSIS — J449 Chronic obstructive pulmonary disease, unspecified: Secondary | ICD-10-CM | POA: Diagnosis not present

## 2022-07-03 DIAGNOSIS — J9601 Acute respiratory failure with hypoxia: Secondary | ICD-10-CM | POA: Diagnosis not present

## 2022-07-03 DIAGNOSIS — I5020 Unspecified systolic (congestive) heart failure: Secondary | ICD-10-CM | POA: Diagnosis not present

## 2022-07-03 DIAGNOSIS — J9602 Acute respiratory failure with hypercapnia: Secondary | ICD-10-CM | POA: Diagnosis not present

## 2022-07-05 NOTE — Progress Notes (Deleted)
GI Office Note    Referring Provider: Carrolyn Meiers* Primary Care Physician:  Carrolyn Meiers, MD  Primary Gastroenterologist: formerly Dr. Oneida Alar (***)  Chief Complaint   No chief complaint on file.   History of Present Illness   [name] is a 71 y.o. female presenting today at the request of [name]* for ***hospital follow up.   Colonoscopy in 2015 with redundant colon, colonic diverticulosis, inadequate prep.  No evidence of stricturing or neoplasm.  EGD in 2012 with esophageal ring in distal esophagus, and mild gastritis.  Seen inpatient in January 2023 for cholelithiasis, choledocholithiasis, concern for evolving cholangitis with leukocytosis.  She was treated with IV antibiotics and underwent 2 ERCP attempts for CBD stone extraction that were unsuccessful.  She did have biliary sphincterotomy but upon further discussion with Dr. Ardis Hughs and Dr. Rush Landmark they were unable to perform repeat ERCP.  She had MRI/MRCP that showed small filling defect in the distal CBD with mild intra and extrahepatic ductal dilation.  She underwent cholecystectomy and intraoperative cholangiogram in hopes to dislodge stone.  If unable to dislodge stone, it was recommended for her to go undergo biliary ductal exploration versus referral to tertiary center.  She is also suffering from constipation that was noted on CT imaging.  Last colonoscopy in 2015 with inadequate prep and was advised on outpatient colonoscopy.  She also had a experiencing some weight loss but felt like it was related to biliary colic.  She was placed on Linzess 145 mcg daily.  Recently seen inpatient in August 2023 for abdominal pain, vomiting, diarrhea for weeks.  She was also noted to have dysphagia to liquids and solids.  She had speech evaluation with gagging and belching after swallowing thin liquids and she refuses solids trial.  She had CT A/P with contrast with findings suggestive of  severe acute colitis, predominantly involving the mid to distal transverse colon.  She was also COVID-positive at that time.  She endorses issues with pills, solids, and liquids as well as odynophagia.  Reported her PPI was helpful for heartburn symptoms.  She reported weight loss given lack of appetite due to abdominal pain and diarrhea.  She did report some red stools but stated she was drinking [name] drinks as well.  She denied any NSAID use, tobacco use, or alcohol.  Also with early satiety.  She underwent EGD 05/08/2022 with diffuse scattered white plaques found in the upper third and middle third of the esophagus, 2 benign-appearing intrinsic stenoses in the distal esophagus and LA grade a esophagitis without bleeding.  Pathology consistent with Candida esophagitis and she was given 3-week course of fluconazole.  She was advised to continue PPI 40 mg twice daily.  She was advised to have outpatient colonoscopy with 2-day prep.  Her CRP was slightly elevated at 3.9.  She was discharged on 05/09/2022 with plan to follow-up with outpatient GI for colonoscopy.  She did not have any episodes of diarrhea while inpatient.  She was seen again and patient, not by GI from 05/28/22 to 06/17/2022 for COPD exacerbation.  She required intubation for a brief period of time and underwent neurology evaluation for confusion/encephalopathy.  Also concern for seizure-like activity and was monitor for this as well.  She was also seen by pulmonology and palliative care while inpatient.  Advised to be on dysphagia 2 diet with feeding assistance and aspiration precautions.  She was discharged to SNF.  Cardiology had plans for outpatient follow-up as well  as eventual left heart cath to rule out ischemic etiology for Takotsubo already myopathy.  Today:   Current Outpatient Medications  Medication Sig Dispense Refill   acetaminophen (TYLENOL) 325 MG tablet Take 2 tablets (650 mg total) by mouth every 6 (six) hours as needed for mild  pain or headache (or Fever >/= 101).     ascorbic acid (VITAMIN C) 500 MG tablet Take 1 tablet (500 mg total) by mouth daily. 30 tablet 1   aspirin EC 81 MG tablet Take 81 mg by mouth daily.       citalopram (CELEXA) 20 MG tablet Take 20 mg by mouth daily.     clopidogrel (PLAVIX) 75 MG tablet Take 1 tablet (75 mg total) by mouth daily.     dextromethorphan-guaiFENesin (MUCINEX DM) 30-600 MG per 12 hr tablet Take 1 tablet by mouth 2 (two) times daily.     docusate sodium (COLACE) 100 MG capsule Take 100 mg by mouth daily.       folic acid (FOLVITE) 1 MG tablet Take 1 tablet (1 mg total) by mouth daily.     Ipratropium-Albuterol (COMBIVENT) 20-100 MCG/ACT AERS respimat Inhale 1 puff into the lungs every 6 (six) hours as needed for wheezing or shortness of breath. 4 g 2   loratadine (CLARITIN) 10 MG tablet Take 10 mg by mouth daily.     losartan (COZAAR) 25 MG tablet Take 0.5 tablets (12.5 mg total) by mouth daily.     metoprolol tartrate (LOPRESSOR) 50 MG tablet Take 50 mg by mouth 2 (two) times daily.     mirtazapine (REMERON) 7.5 MG tablet Take 7.5 mg by mouth at bedtime.     pantoprazole (PROTONIX) 40 MG tablet Take 1 tablet (40 mg total) by mouth 2 (two) times daily. 60 tablet 1   rosuvastatin (CRESTOR) 20 MG tablet Take 1 tablet (20 mg total) by mouth every evening.     vitamin B-12 (CYANOCOBALAMIN) 500 MCG tablet Take 1 tablet (500 mcg total) by mouth daily.     zinc sulfate 220 (50 Zn) MG capsule Take 1 capsule (220 mg total) by mouth daily. 30 capsule 1   No current facility-administered medications for this visit.    Past Medical History:  Diagnosis Date   Anxiety    Blind    right   Colitis 09/07/2011   CVA (cerebral infarction)    GERD (gastroesophageal reflux disease)    Hyperlipidemia    Hypertension    Myocardial infarct (HCC)    1997, 2004   Severe major depression with psychotic features (HCC)    2004   Stroke [facility]) 2002    Past Surgical History:  Procedure  Laterality Date   ABDOMINAL HYSTERECTOMY     BILATERAL SALPINGOOPHORECTOMY     BIOPSY  05/08/2022   Procedure: BIOPSY;  Surgeon: Dolores Frame, MD;  [facility];  Service: Gastroenterology;;   CHOLECYSTECTOMY N/A 10/15/2021   Procedure: LAPAROSCOPIC CHOLECYSTECTOMY WITH INTRAOPERATIVE CHOLANGIOGRAM;  Surgeon: Franky Macho, MD;  Location: [facility];  Service: General;  Laterality: N/A;   COLONOSCOPY N/A 07/17/2014   RMR: Extremely redundant colon. Colonic diverticulosis. Inadequate prepartation precluded examination of all the rectal and colonic.  I suspect slow colonic transit in the setting of a markely redundant colon.   CORONARY ANGIOPLASTY WITH STENT PLACEMENT  [id number]   cyst removed from left wrist     ERCP N/A 10/10/2021   Procedure: ENDOSCOPIC RETROGRADE CHOLANGIOPANCREATOGRAPHY (ERCP);  Surgeon: Malissa Hippo, MD;  Location: [facility];  Service: Gastroenterology;  Laterality: N/A;   ERCP N/A 10/13/2021   Procedure: ENDOSCOPIC RETROGRADE CHOLANGIOPANCREATOGRAPHY (ERCP);  Surgeon: Rogene Houston, MD;  Location: [facility];  Service: Gastroenterology;  Laterality: N/A;   ESOPHAGEAL DILATION  05/08/2022   Procedure: ESOPHAGEAL DILATION;  Surgeon: Montez Morita, Quillian Quince, MD;  Location: [facility];  Service: Gastroenterology;;  savory dilation'   ESOPHAGOGASTRODUODENOSCOPY  09/05/2004   Dr Chucky May gastritis, candida esophagitis   ESOPHAGOGASTRODUODENOSCOPY  09/07/2011   [id number], esophageal in the distal esophagus/Mild gastritis   ESOPHAGOGASTRODUODENOSCOPY (EGD) WITH PROPOFOL N/A 05/08/2022   Procedure: ESOPHAGOGASTRODUODENOSCOPY (EGD) WITH PROPOFOL;  Surgeon: Harvel Quale, MD;  [facility];  Service: Gastroenterology;  Laterality: N/A;  with possible esophageal dilation   Lumps removed from each breast     SPHINCTEROTOMY N/A 10/10/2021   Procedure: SPHINCTEROTOMY INCOMPLETE, STONE NOT REMOVED;  Surgeon: Rogene Houston, MD;  Location:  [facility];  Service: Gastroenterology;  Laterality: N/A;    Family History  Problem Relation Age of Onset   Multiple sclerosis Father    Alzheimer's disease Mother    Hypertension Mother    Diabetes Mother    Colon cancer Neg Hx     Allergies as of 07/07/2022 - Review Complete 06/10/2022  Allergen Reaction Noted   Aspirin Swelling and Other (See Comments) 09/07/2011   Bee venom Swelling 09/07/2011   Contrast media [iodinated contrast media] Swelling 09/07/2011   Lisinopril Swelling 09/07/2011   Motrin [ibuprofen] Swelling 09/07/2011   Penicillins Anaphylaxis 09/06/2011   Dilaudid [hydromorphone hcl] Itching 09/07/2011    Social History   Socioeconomic History   Marital status: Widowed    Spouse name: Not on file   Number of children: 3   Years of education: Not on file   Highest education level: Not on file  Occupational History   Occupation: disabled, Advertising account planner  Tobacco Use   Smoking status: Former    Packs/day: 0.25    Years: 40.00    Total pack years: 10.00    Types: Cigarettes    Quit date: 08/02/2017    Years since quitting: 4.9   Smokeless tobacco: Never  Vaping Use   Vaping Use: Never used  Substance and Sexual Activity   Alcohol use: No    Alcohol/week: 0.0 standard drinks of alcohol   Drug use: Yes    Types: Marijuana   Sexual activity: Not Currently    Birth control/protection: Post-menopausal  Other Topics Concern   Not on file  Social History Narrative   Lives alone   Social Determinants of Health   Financial Resource Strain: Not on file  Food Insecurity: No Food Insecurity (05/29/2022)   Hunger Vital Sign    Worried About Running Out of Food in the Last Year: Never true    [place] in the Last Year: Never true  Transportation Needs: No Transportation Needs (05/29/2022)   PRAPARE - Hydrologist (Medical): No    Lack of Transportation (Non-Medical): No  Physical Activity: Not on file  Stress: Not on file   Social Connections: Not on file  Intimate Partner Violence: Not At Risk (05/29/2022)   Humiliation, Afraid, Rape, and Kick questionnaire    Fear of Current or Ex-Partner: No    Emotionally Abused: No    Physically Abused: No    Sexually Abused: No     Review of Systems   Gen: Denies any fever, chills, fatigue, weight loss, lack of appetite.  CV: Denies chest  pain, heart palpitations, peripheral edema, syncope.  Resp: Denies shortness of breath at rest or with exertion. Denies wheezing or cough.  GI: see HPI GU : Denies urinary burning, urinary frequency, urinary hesitancy MS: Denies joint pain, muscle weakness, cramps, or limitation of movement.  Derm: Denies rash, itching, dry skin Psych: Denies depression, anxiety, memory loss, and confusion Heme: Denies bruising, bleeding, and enlarged lymph nodes.   Physical Exam   There were no vitals taken for this visit.  General:   Alert and oriented. Pleasant and cooperative. Well-nourished and well-developed.  Head:  Normocephalic and atraumatic. Eyes:  Without icterus, sclera clear and conjunctiva pink.  Ears:  Normal auditory acuity. Mouth:  No deformity or lesions, oral mucosa pink.  Lungs:  Clear to auscultation bilaterally. No wheezes, rales, or rhonchi. No distress.  Heart:  S1, S2 present without murmurs appreciated.  Abdomen:  +BS, soft, non-tender and non-distended. No HSM noted. No guarding or rebound. No masses appreciated.  Rectal:  Deferred  Msk:  Symmetrical without gross deformities. Normal posture. Extremities:  Without edema. Neurologic:  Alert and  oriented x4;  grossly normal neurologically. Skin:  Intact without significant lesions or rashes. Psych:  Alert and cooperative. Normal mood and affect.   Assessment   [name] is a 71 y.o. female with a history of *** presenting today with      PLAN   ***    Venetia Night, MSN, FNP-BC, AGACNP-BC [facility]

## 2022-07-06 DIAGNOSIS — J441 Chronic obstructive pulmonary disease with (acute) exacerbation: Secondary | ICD-10-CM | POA: Diagnosis not present

## 2022-07-06 DIAGNOSIS — N1831 Chronic kidney disease, stage 3a: Secondary | ICD-10-CM | POA: Diagnosis not present

## 2022-07-06 DIAGNOSIS — E46 Unspecified protein-calorie malnutrition: Secondary | ICD-10-CM | POA: Diagnosis not present

## 2022-07-06 DIAGNOSIS — D696 Thrombocytopenia, unspecified: Secondary | ICD-10-CM | POA: Diagnosis not present

## 2022-07-06 DIAGNOSIS — Z87891 Personal history of nicotine dependence: Secondary | ICD-10-CM | POA: Diagnosis not present

## 2022-07-06 DIAGNOSIS — R1310 Dysphagia, unspecified: Secondary | ICD-10-CM | POA: Diagnosis not present

## 2022-07-06 DIAGNOSIS — D649 Anemia, unspecified: Secondary | ICD-10-CM | POA: Diagnosis not present

## 2022-07-06 DIAGNOSIS — Z8673 Personal history of transient ischemic attack (TIA), and cerebral infarction without residual deficits: Secondary | ICD-10-CM | POA: Diagnosis not present

## 2022-07-06 DIAGNOSIS — J440 Chronic obstructive pulmonary disease with acute lower respiratory infection: Secondary | ICD-10-CM | POA: Diagnosis not present

## 2022-07-06 DIAGNOSIS — I2510 Atherosclerotic heart disease of native coronary artery without angina pectoris: Secondary | ICD-10-CM | POA: Diagnosis not present

## 2022-07-06 DIAGNOSIS — I252 Old myocardial infarction: Secondary | ICD-10-CM | POA: Diagnosis not present

## 2022-07-06 DIAGNOSIS — K219 Gastro-esophageal reflux disease without esophagitis: Secondary | ICD-10-CM | POA: Diagnosis not present

## 2022-07-06 DIAGNOSIS — I110 Hypertensive heart disease with heart failure: Secondary | ICD-10-CM | POA: Diagnosis not present

## 2022-07-06 DIAGNOSIS — I2489 Other forms of acute ischemic heart disease: Secondary | ICD-10-CM | POA: Diagnosis not present

## 2022-07-06 DIAGNOSIS — Z9981 Dependence on supplemental oxygen: Secondary | ICD-10-CM | POA: Diagnosis not present

## 2022-07-06 DIAGNOSIS — I10 Essential (primary) hypertension: Secondary | ICD-10-CM | POA: Diagnosis not present

## 2022-07-06 DIAGNOSIS — F32A Depression, unspecified: Secondary | ICD-10-CM | POA: Diagnosis not present

## 2022-07-06 DIAGNOSIS — I5021 Acute systolic (congestive) heart failure: Secondary | ICD-10-CM | POA: Diagnosis not present

## 2022-07-06 DIAGNOSIS — Z955 Presence of coronary angioplasty implant and graft: Secondary | ICD-10-CM | POA: Diagnosis not present

## 2022-07-06 DIAGNOSIS — H54413A Blindness right eye category 3, normal vision left eye: Secondary | ICD-10-CM | POA: Diagnosis not present

## 2022-07-07 ENCOUNTER — Ambulatory Visit: Payer: Medicare Other | Admitting: Gastroenterology

## 2022-07-07 DIAGNOSIS — I110 Hypertensive heart disease with heart failure: Secondary | ICD-10-CM | POA: Diagnosis not present

## 2022-07-07 DIAGNOSIS — K219 Gastro-esophageal reflux disease without esophagitis: Secondary | ICD-10-CM | POA: Diagnosis not present

## 2022-07-07 DIAGNOSIS — D696 Thrombocytopenia, unspecified: Secondary | ICD-10-CM | POA: Diagnosis not present

## 2022-07-07 DIAGNOSIS — I2510 Atherosclerotic heart disease of native coronary artery without angina pectoris: Secondary | ICD-10-CM | POA: Diagnosis not present

## 2022-07-07 DIAGNOSIS — J440 Chronic obstructive pulmonary disease with acute lower respiratory infection: Secondary | ICD-10-CM | POA: Diagnosis not present

## 2022-07-07 DIAGNOSIS — I2489 Other forms of acute ischemic heart disease: Secondary | ICD-10-CM | POA: Diagnosis not present

## 2022-07-07 DIAGNOSIS — I252 Old myocardial infarction: Secondary | ICD-10-CM | POA: Diagnosis not present

## 2022-07-07 DIAGNOSIS — Z87891 Personal history of nicotine dependence: Secondary | ICD-10-CM | POA: Diagnosis not present

## 2022-07-07 DIAGNOSIS — D649 Anemia, unspecified: Secondary | ICD-10-CM | POA: Diagnosis not present

## 2022-07-07 DIAGNOSIS — Z955 Presence of coronary angioplasty implant and graft: Secondary | ICD-10-CM | POA: Diagnosis not present

## 2022-07-07 DIAGNOSIS — R1310 Dysphagia, unspecified: Secondary | ICD-10-CM | POA: Diagnosis not present

## 2022-07-07 DIAGNOSIS — Z9981 Dependence on supplemental oxygen: Secondary | ICD-10-CM | POA: Diagnosis not present

## 2022-07-07 DIAGNOSIS — N1831 Chronic kidney disease, stage 3a: Secondary | ICD-10-CM | POA: Diagnosis not present

## 2022-07-07 DIAGNOSIS — I5021 Acute systolic (congestive) heart failure: Secondary | ICD-10-CM | POA: Diagnosis not present

## 2022-07-07 DIAGNOSIS — F32A Depression, unspecified: Secondary | ICD-10-CM | POA: Diagnosis not present

## 2022-07-07 DIAGNOSIS — Z8673 Personal history of transient ischemic attack (TIA), and cerebral infarction without residual deficits: Secondary | ICD-10-CM | POA: Diagnosis not present

## 2022-07-07 DIAGNOSIS — J441 Chronic obstructive pulmonary disease with (acute) exacerbation: Secondary | ICD-10-CM | POA: Diagnosis not present

## 2022-07-07 DIAGNOSIS — H54413A Blindness right eye category 3, normal vision left eye: Secondary | ICD-10-CM | POA: Diagnosis not present

## 2022-07-07 DIAGNOSIS — E46 Unspecified protein-calorie malnutrition: Secondary | ICD-10-CM | POA: Diagnosis not present

## 2022-07-09 DIAGNOSIS — I252 Old myocardial infarction: Secondary | ICD-10-CM | POA: Diagnosis not present

## 2022-07-09 DIAGNOSIS — H54413A Blindness right eye category 3, normal vision left eye: Secondary | ICD-10-CM | POA: Diagnosis not present

## 2022-07-09 DIAGNOSIS — R1310 Dysphagia, unspecified: Secondary | ICD-10-CM | POA: Diagnosis not present

## 2022-07-09 DIAGNOSIS — J441 Chronic obstructive pulmonary disease with (acute) exacerbation: Secondary | ICD-10-CM | POA: Diagnosis not present

## 2022-07-09 DIAGNOSIS — K219 Gastro-esophageal reflux disease without esophagitis: Secondary | ICD-10-CM | POA: Diagnosis not present

## 2022-07-09 DIAGNOSIS — I5021 Acute systolic (congestive) heart failure: Secondary | ICD-10-CM | POA: Diagnosis not present

## 2022-07-09 DIAGNOSIS — Z955 Presence of coronary angioplasty implant and graft: Secondary | ICD-10-CM | POA: Diagnosis not present

## 2022-07-09 DIAGNOSIS — I2489 Other forms of acute ischemic heart disease: Secondary | ICD-10-CM | POA: Diagnosis not present

## 2022-07-09 DIAGNOSIS — I2510 Atherosclerotic heart disease of native coronary artery without angina pectoris: Secondary | ICD-10-CM | POA: Diagnosis not present

## 2022-07-09 DIAGNOSIS — D696 Thrombocytopenia, unspecified: Secondary | ICD-10-CM | POA: Diagnosis not present

## 2022-07-09 DIAGNOSIS — D649 Anemia, unspecified: Secondary | ICD-10-CM | POA: Diagnosis not present

## 2022-07-09 DIAGNOSIS — F32A Depression, unspecified: Secondary | ICD-10-CM | POA: Diagnosis not present

## 2022-07-09 DIAGNOSIS — Z8673 Personal history of transient ischemic attack (TIA), and cerebral infarction without residual deficits: Secondary | ICD-10-CM | POA: Diagnosis not present

## 2022-07-09 DIAGNOSIS — N1831 Chronic kidney disease, stage 3a: Secondary | ICD-10-CM | POA: Diagnosis not present

## 2022-07-09 DIAGNOSIS — E46 Unspecified protein-calorie malnutrition: Secondary | ICD-10-CM | POA: Diagnosis not present

## 2022-07-09 DIAGNOSIS — Z87891 Personal history of nicotine dependence: Secondary | ICD-10-CM | POA: Diagnosis not present

## 2022-07-09 DIAGNOSIS — I110 Hypertensive heart disease with heart failure: Secondary | ICD-10-CM | POA: Diagnosis not present

## 2022-07-09 DIAGNOSIS — Z9981 Dependence on supplemental oxygen: Secondary | ICD-10-CM | POA: Diagnosis not present

## 2022-07-09 DIAGNOSIS — J440 Chronic obstructive pulmonary disease with acute lower respiratory infection: Secondary | ICD-10-CM | POA: Diagnosis not present

## 2022-07-10 DIAGNOSIS — D649 Anemia, unspecified: Secondary | ICD-10-CM | POA: Diagnosis not present

## 2022-07-10 DIAGNOSIS — F32A Depression, unspecified: Secondary | ICD-10-CM | POA: Diagnosis not present

## 2022-07-10 DIAGNOSIS — H54413A Blindness right eye category 3, normal vision left eye: Secondary | ICD-10-CM | POA: Diagnosis not present

## 2022-07-10 DIAGNOSIS — I110 Hypertensive heart disease with heart failure: Secondary | ICD-10-CM | POA: Diagnosis not present

## 2022-07-10 DIAGNOSIS — Z87891 Personal history of nicotine dependence: Secondary | ICD-10-CM | POA: Diagnosis not present

## 2022-07-10 DIAGNOSIS — E46 Unspecified protein-calorie malnutrition: Secondary | ICD-10-CM | POA: Diagnosis not present

## 2022-07-10 DIAGNOSIS — D696 Thrombocytopenia, unspecified: Secondary | ICD-10-CM | POA: Diagnosis not present

## 2022-07-10 DIAGNOSIS — K219 Gastro-esophageal reflux disease without esophagitis: Secondary | ICD-10-CM | POA: Diagnosis not present

## 2022-07-10 DIAGNOSIS — Z9981 Dependence on supplemental oxygen: Secondary | ICD-10-CM | POA: Diagnosis not present

## 2022-07-10 DIAGNOSIS — I2489 Other forms of acute ischemic heart disease: Secondary | ICD-10-CM | POA: Diagnosis not present

## 2022-07-10 DIAGNOSIS — N1831 Chronic kidney disease, stage 3a: Secondary | ICD-10-CM | POA: Diagnosis not present

## 2022-07-10 DIAGNOSIS — J441 Chronic obstructive pulmonary disease with (acute) exacerbation: Secondary | ICD-10-CM | POA: Diagnosis not present

## 2022-07-10 DIAGNOSIS — R1310 Dysphagia, unspecified: Secondary | ICD-10-CM | POA: Diagnosis not present

## 2022-07-10 DIAGNOSIS — I5021 Acute systolic (congestive) heart failure: Secondary | ICD-10-CM | POA: Diagnosis not present

## 2022-07-10 DIAGNOSIS — J440 Chronic obstructive pulmonary disease with acute lower respiratory infection: Secondary | ICD-10-CM | POA: Diagnosis not present

## 2022-07-10 DIAGNOSIS — I2510 Atherosclerotic heart disease of native coronary artery without angina pectoris: Secondary | ICD-10-CM | POA: Diagnosis not present

## 2022-07-10 DIAGNOSIS — Z8673 Personal history of transient ischemic attack (TIA), and cerebral infarction without residual deficits: Secondary | ICD-10-CM | POA: Diagnosis not present

## 2022-07-10 DIAGNOSIS — I252 Old myocardial infarction: Secondary | ICD-10-CM | POA: Diagnosis not present

## 2022-07-10 DIAGNOSIS — Z955 Presence of coronary angioplasty implant and graft: Secondary | ICD-10-CM | POA: Diagnosis not present

## 2022-07-13 DIAGNOSIS — I252 Old myocardial infarction: Secondary | ICD-10-CM | POA: Diagnosis not present

## 2022-07-13 DIAGNOSIS — E46 Unspecified protein-calorie malnutrition: Secondary | ICD-10-CM | POA: Diagnosis not present

## 2022-07-13 DIAGNOSIS — I110 Hypertensive heart disease with heart failure: Secondary | ICD-10-CM | POA: Diagnosis not present

## 2022-07-13 DIAGNOSIS — Z9981 Dependence on supplemental oxygen: Secondary | ICD-10-CM | POA: Diagnosis not present

## 2022-07-13 DIAGNOSIS — Z8673 Personal history of transient ischemic attack (TIA), and cerebral infarction without residual deficits: Secondary | ICD-10-CM | POA: Diagnosis not present

## 2022-07-13 DIAGNOSIS — Z87891 Personal history of nicotine dependence: Secondary | ICD-10-CM | POA: Diagnosis not present

## 2022-07-13 DIAGNOSIS — I5021 Acute systolic (congestive) heart failure: Secondary | ICD-10-CM | POA: Diagnosis not present

## 2022-07-13 DIAGNOSIS — H54413A Blindness right eye category 3, normal vision left eye: Secondary | ICD-10-CM | POA: Diagnosis not present

## 2022-07-13 DIAGNOSIS — Z955 Presence of coronary angioplasty implant and graft: Secondary | ICD-10-CM | POA: Diagnosis not present

## 2022-07-13 DIAGNOSIS — R1310 Dysphagia, unspecified: Secondary | ICD-10-CM | POA: Diagnosis not present

## 2022-07-13 DIAGNOSIS — I2510 Atherosclerotic heart disease of native coronary artery without angina pectoris: Secondary | ICD-10-CM | POA: Diagnosis not present

## 2022-07-13 DIAGNOSIS — K219 Gastro-esophageal reflux disease without esophagitis: Secondary | ICD-10-CM | POA: Diagnosis not present

## 2022-07-13 DIAGNOSIS — I2489 Other forms of acute ischemic heart disease: Secondary | ICD-10-CM | POA: Diagnosis not present

## 2022-07-13 DIAGNOSIS — D649 Anemia, unspecified: Secondary | ICD-10-CM | POA: Diagnosis not present

## 2022-07-13 DIAGNOSIS — J440 Chronic obstructive pulmonary disease with acute lower respiratory infection: Secondary | ICD-10-CM | POA: Diagnosis not present

## 2022-07-13 DIAGNOSIS — F32A Depression, unspecified: Secondary | ICD-10-CM | POA: Diagnosis not present

## 2022-07-13 DIAGNOSIS — N1831 Chronic kidney disease, stage 3a: Secondary | ICD-10-CM | POA: Diagnosis not present

## 2022-07-13 DIAGNOSIS — J441 Chronic obstructive pulmonary disease with (acute) exacerbation: Secondary | ICD-10-CM | POA: Diagnosis not present

## 2022-07-13 DIAGNOSIS — D696 Thrombocytopenia, unspecified: Secondary | ICD-10-CM | POA: Diagnosis not present

## 2022-07-14 ENCOUNTER — Ambulatory Visit: Payer: Medicare Other | Admitting: Physician Assistant

## 2022-07-14 DIAGNOSIS — R1310 Dysphagia, unspecified: Secondary | ICD-10-CM | POA: Diagnosis not present

## 2022-07-14 DIAGNOSIS — I2510 Atherosclerotic heart disease of native coronary artery without angina pectoris: Secondary | ICD-10-CM | POA: Diagnosis not present

## 2022-07-14 DIAGNOSIS — Z9981 Dependence on supplemental oxygen: Secondary | ICD-10-CM | POA: Diagnosis not present

## 2022-07-14 DIAGNOSIS — I252 Old myocardial infarction: Secondary | ICD-10-CM | POA: Diagnosis not present

## 2022-07-14 DIAGNOSIS — H54413A Blindness right eye category 3, normal vision left eye: Secondary | ICD-10-CM | POA: Diagnosis not present

## 2022-07-14 DIAGNOSIS — D696 Thrombocytopenia, unspecified: Secondary | ICD-10-CM | POA: Diagnosis not present

## 2022-07-14 DIAGNOSIS — J440 Chronic obstructive pulmonary disease with acute lower respiratory infection: Secondary | ICD-10-CM | POA: Diagnosis not present

## 2022-07-14 DIAGNOSIS — Z8673 Personal history of transient ischemic attack (TIA), and cerebral infarction without residual deficits: Secondary | ICD-10-CM | POA: Diagnosis not present

## 2022-07-14 DIAGNOSIS — K219 Gastro-esophageal reflux disease without esophagitis: Secondary | ICD-10-CM | POA: Diagnosis not present

## 2022-07-14 DIAGNOSIS — F32A Depression, unspecified: Secondary | ICD-10-CM | POA: Diagnosis not present

## 2022-07-14 DIAGNOSIS — E46 Unspecified protein-calorie malnutrition: Secondary | ICD-10-CM | POA: Diagnosis not present

## 2022-07-14 DIAGNOSIS — N1831 Chronic kidney disease, stage 3a: Secondary | ICD-10-CM | POA: Diagnosis not present

## 2022-07-14 DIAGNOSIS — I5021 Acute systolic (congestive) heart failure: Secondary | ICD-10-CM | POA: Diagnosis not present

## 2022-07-14 DIAGNOSIS — J441 Chronic obstructive pulmonary disease with (acute) exacerbation: Secondary | ICD-10-CM | POA: Diagnosis not present

## 2022-07-14 DIAGNOSIS — Z87891 Personal history of nicotine dependence: Secondary | ICD-10-CM | POA: Diagnosis not present

## 2022-07-14 DIAGNOSIS — I2489 Other forms of acute ischemic heart disease: Secondary | ICD-10-CM | POA: Diagnosis not present

## 2022-07-14 DIAGNOSIS — D649 Anemia, unspecified: Secondary | ICD-10-CM | POA: Diagnosis not present

## 2022-07-14 DIAGNOSIS — I110 Hypertensive heart disease with heart failure: Secondary | ICD-10-CM | POA: Diagnosis not present

## 2022-07-14 DIAGNOSIS — Z955 Presence of coronary angioplasty implant and graft: Secondary | ICD-10-CM | POA: Diagnosis not present

## 2022-07-15 DIAGNOSIS — D696 Thrombocytopenia, unspecified: Secondary | ICD-10-CM | POA: Diagnosis not present

## 2022-07-15 DIAGNOSIS — F32A Depression, unspecified: Secondary | ICD-10-CM | POA: Diagnosis not present

## 2022-07-15 DIAGNOSIS — Z9981 Dependence on supplemental oxygen: Secondary | ICD-10-CM | POA: Diagnosis not present

## 2022-07-15 DIAGNOSIS — H54413A Blindness right eye category 3, normal vision left eye: Secondary | ICD-10-CM | POA: Diagnosis not present

## 2022-07-15 DIAGNOSIS — Z955 Presence of coronary angioplasty implant and graft: Secondary | ICD-10-CM | POA: Diagnosis not present

## 2022-07-15 DIAGNOSIS — I5021 Acute systolic (congestive) heart failure: Secondary | ICD-10-CM | POA: Diagnosis not present

## 2022-07-15 DIAGNOSIS — J441 Chronic obstructive pulmonary disease with (acute) exacerbation: Secondary | ICD-10-CM | POA: Diagnosis not present

## 2022-07-15 DIAGNOSIS — I252 Old myocardial infarction: Secondary | ICD-10-CM | POA: Diagnosis not present

## 2022-07-15 DIAGNOSIS — D649 Anemia, unspecified: Secondary | ICD-10-CM | POA: Diagnosis not present

## 2022-07-15 DIAGNOSIS — I2489 Other forms of acute ischemic heart disease: Secondary | ICD-10-CM | POA: Diagnosis not present

## 2022-07-15 DIAGNOSIS — J440 Chronic obstructive pulmonary disease with acute lower respiratory infection: Secondary | ICD-10-CM | POA: Diagnosis not present

## 2022-07-15 DIAGNOSIS — R1310 Dysphagia, unspecified: Secondary | ICD-10-CM | POA: Diagnosis not present

## 2022-07-15 DIAGNOSIS — I2510 Atherosclerotic heart disease of native coronary artery without angina pectoris: Secondary | ICD-10-CM | POA: Diagnosis not present

## 2022-07-15 DIAGNOSIS — Z8673 Personal history of transient ischemic attack (TIA), and cerebral infarction without residual deficits: Secondary | ICD-10-CM | POA: Diagnosis not present

## 2022-07-15 DIAGNOSIS — N1831 Chronic kidney disease, stage 3a: Secondary | ICD-10-CM | POA: Diagnosis not present

## 2022-07-15 DIAGNOSIS — K219 Gastro-esophageal reflux disease without esophagitis: Secondary | ICD-10-CM | POA: Diagnosis not present

## 2022-07-15 DIAGNOSIS — I110 Hypertensive heart disease with heart failure: Secondary | ICD-10-CM | POA: Diagnosis not present

## 2022-07-15 DIAGNOSIS — Z87891 Personal history of nicotine dependence: Secondary | ICD-10-CM | POA: Diagnosis not present

## 2022-07-15 DIAGNOSIS — E46 Unspecified protein-calorie malnutrition: Secondary | ICD-10-CM | POA: Diagnosis not present

## 2022-07-16 DIAGNOSIS — N1831 Chronic kidney disease, stage 3a: Secondary | ICD-10-CM | POA: Diagnosis not present

## 2022-07-16 DIAGNOSIS — E46 Unspecified protein-calorie malnutrition: Secondary | ICD-10-CM | POA: Diagnosis not present

## 2022-07-16 DIAGNOSIS — R1310 Dysphagia, unspecified: Secondary | ICD-10-CM | POA: Diagnosis not present

## 2022-07-16 DIAGNOSIS — Z87891 Personal history of nicotine dependence: Secondary | ICD-10-CM | POA: Diagnosis not present

## 2022-07-16 DIAGNOSIS — J440 Chronic obstructive pulmonary disease with acute lower respiratory infection: Secondary | ICD-10-CM | POA: Diagnosis not present

## 2022-07-16 DIAGNOSIS — K219 Gastro-esophageal reflux disease without esophagitis: Secondary | ICD-10-CM | POA: Diagnosis not present

## 2022-07-16 DIAGNOSIS — Z9981 Dependence on supplemental oxygen: Secondary | ICD-10-CM | POA: Diagnosis not present

## 2022-07-16 DIAGNOSIS — I252 Old myocardial infarction: Secondary | ICD-10-CM | POA: Diagnosis not present

## 2022-07-16 DIAGNOSIS — D696 Thrombocytopenia, unspecified: Secondary | ICD-10-CM | POA: Diagnosis not present

## 2022-07-16 DIAGNOSIS — I5021 Acute systolic (congestive) heart failure: Secondary | ICD-10-CM | POA: Diagnosis not present

## 2022-07-16 DIAGNOSIS — J441 Chronic obstructive pulmonary disease with (acute) exacerbation: Secondary | ICD-10-CM | POA: Diagnosis not present

## 2022-07-16 DIAGNOSIS — I2489 Other forms of acute ischemic heart disease: Secondary | ICD-10-CM | POA: Diagnosis not present

## 2022-07-16 DIAGNOSIS — D649 Anemia, unspecified: Secondary | ICD-10-CM | POA: Diagnosis not present

## 2022-07-16 DIAGNOSIS — I2510 Atherosclerotic heart disease of native coronary artery without angina pectoris: Secondary | ICD-10-CM | POA: Diagnosis not present

## 2022-07-16 DIAGNOSIS — F32A Depression, unspecified: Secondary | ICD-10-CM | POA: Diagnosis not present

## 2022-07-16 DIAGNOSIS — H54413A Blindness right eye category 3, normal vision left eye: Secondary | ICD-10-CM | POA: Diagnosis not present

## 2022-07-16 DIAGNOSIS — Z8673 Personal history of transient ischemic attack (TIA), and cerebral infarction without residual deficits: Secondary | ICD-10-CM | POA: Diagnosis not present

## 2022-07-16 DIAGNOSIS — Z955 Presence of coronary angioplasty implant and graft: Secondary | ICD-10-CM | POA: Diagnosis not present

## 2022-07-16 DIAGNOSIS — I110 Hypertensive heart disease with heart failure: Secondary | ICD-10-CM | POA: Diagnosis not present

## 2022-07-17 DIAGNOSIS — J441 Chronic obstructive pulmonary disease with (acute) exacerbation: Secondary | ICD-10-CM | POA: Diagnosis not present

## 2022-07-17 DIAGNOSIS — Z8673 Personal history of transient ischemic attack (TIA), and cerebral infarction without residual deficits: Secondary | ICD-10-CM | POA: Diagnosis not present

## 2022-07-17 DIAGNOSIS — K219 Gastro-esophageal reflux disease without esophagitis: Secondary | ICD-10-CM | POA: Diagnosis not present

## 2022-07-17 DIAGNOSIS — H54413A Blindness right eye category 3, normal vision left eye: Secondary | ICD-10-CM | POA: Diagnosis not present

## 2022-07-17 DIAGNOSIS — D696 Thrombocytopenia, unspecified: Secondary | ICD-10-CM | POA: Diagnosis not present

## 2022-07-17 DIAGNOSIS — I252 Old myocardial infarction: Secondary | ICD-10-CM | POA: Diagnosis not present

## 2022-07-17 DIAGNOSIS — R1310 Dysphagia, unspecified: Secondary | ICD-10-CM | POA: Diagnosis not present

## 2022-07-17 DIAGNOSIS — J440 Chronic obstructive pulmonary disease with acute lower respiratory infection: Secondary | ICD-10-CM | POA: Diagnosis not present

## 2022-07-17 DIAGNOSIS — E46 Unspecified protein-calorie malnutrition: Secondary | ICD-10-CM | POA: Diagnosis not present

## 2022-07-17 DIAGNOSIS — Z87891 Personal history of nicotine dependence: Secondary | ICD-10-CM | POA: Diagnosis not present

## 2022-07-17 DIAGNOSIS — I110 Hypertensive heart disease with heart failure: Secondary | ICD-10-CM | POA: Diagnosis not present

## 2022-07-17 DIAGNOSIS — I5021 Acute systolic (congestive) heart failure: Secondary | ICD-10-CM | POA: Diagnosis not present

## 2022-07-17 DIAGNOSIS — Z9981 Dependence on supplemental oxygen: Secondary | ICD-10-CM | POA: Diagnosis not present

## 2022-07-17 DIAGNOSIS — I2510 Atherosclerotic heart disease of native coronary artery without angina pectoris: Secondary | ICD-10-CM | POA: Diagnosis not present

## 2022-07-17 DIAGNOSIS — N1831 Chronic kidney disease, stage 3a: Secondary | ICD-10-CM | POA: Diagnosis not present

## 2022-07-17 DIAGNOSIS — D649 Anemia, unspecified: Secondary | ICD-10-CM | POA: Diagnosis not present

## 2022-07-17 DIAGNOSIS — F32A Depression, unspecified: Secondary | ICD-10-CM | POA: Diagnosis not present

## 2022-07-17 DIAGNOSIS — Z955 Presence of coronary angioplasty implant and graft: Secondary | ICD-10-CM | POA: Diagnosis not present

## 2022-07-17 DIAGNOSIS — I2489 Other forms of acute ischemic heart disease: Secondary | ICD-10-CM | POA: Diagnosis not present

## 2022-07-20 DIAGNOSIS — R1310 Dysphagia, unspecified: Secondary | ICD-10-CM | POA: Diagnosis not present

## 2022-07-20 DIAGNOSIS — K219 Gastro-esophageal reflux disease without esophagitis: Secondary | ICD-10-CM | POA: Diagnosis not present

## 2022-07-20 DIAGNOSIS — D649 Anemia, unspecified: Secondary | ICD-10-CM | POA: Diagnosis not present

## 2022-07-20 DIAGNOSIS — Z8673 Personal history of transient ischemic attack (TIA), and cerebral infarction without residual deficits: Secondary | ICD-10-CM | POA: Diagnosis not present

## 2022-07-20 DIAGNOSIS — I252 Old myocardial infarction: Secondary | ICD-10-CM | POA: Diagnosis not present

## 2022-07-20 DIAGNOSIS — Z955 Presence of coronary angioplasty implant and graft: Secondary | ICD-10-CM | POA: Diagnosis not present

## 2022-07-20 DIAGNOSIS — I2489 Other forms of acute ischemic heart disease: Secondary | ICD-10-CM | POA: Diagnosis not present

## 2022-07-20 DIAGNOSIS — E46 Unspecified protein-calorie malnutrition: Secondary | ICD-10-CM | POA: Diagnosis not present

## 2022-07-20 DIAGNOSIS — J441 Chronic obstructive pulmonary disease with (acute) exacerbation: Secondary | ICD-10-CM | POA: Diagnosis not present

## 2022-07-20 DIAGNOSIS — H54413A Blindness right eye category 3, normal vision left eye: Secondary | ICD-10-CM | POA: Diagnosis not present

## 2022-07-20 DIAGNOSIS — Z87891 Personal history of nicotine dependence: Secondary | ICD-10-CM | POA: Diagnosis not present

## 2022-07-20 DIAGNOSIS — I2510 Atherosclerotic heart disease of native coronary artery without angina pectoris: Secondary | ICD-10-CM | POA: Diagnosis not present

## 2022-07-20 DIAGNOSIS — J440 Chronic obstructive pulmonary disease with acute lower respiratory infection: Secondary | ICD-10-CM | POA: Diagnosis not present

## 2022-07-20 DIAGNOSIS — I5021 Acute systolic (congestive) heart failure: Secondary | ICD-10-CM | POA: Diagnosis not present

## 2022-07-20 DIAGNOSIS — D696 Thrombocytopenia, unspecified: Secondary | ICD-10-CM | POA: Diagnosis not present

## 2022-07-20 DIAGNOSIS — Z9981 Dependence on supplemental oxygen: Secondary | ICD-10-CM | POA: Diagnosis not present

## 2022-07-20 DIAGNOSIS — F32A Depression, unspecified: Secondary | ICD-10-CM | POA: Diagnosis not present

## 2022-07-20 DIAGNOSIS — I110 Hypertensive heart disease with heart failure: Secondary | ICD-10-CM | POA: Diagnosis not present

## 2022-07-20 DIAGNOSIS — N1831 Chronic kidney disease, stage 3a: Secondary | ICD-10-CM | POA: Diagnosis not present

## 2022-07-21 DIAGNOSIS — D649 Anemia, unspecified: Secondary | ICD-10-CM | POA: Diagnosis not present

## 2022-07-21 DIAGNOSIS — I110 Hypertensive heart disease with heart failure: Secondary | ICD-10-CM | POA: Diagnosis not present

## 2022-07-21 DIAGNOSIS — Z87891 Personal history of nicotine dependence: Secondary | ICD-10-CM | POA: Diagnosis not present

## 2022-07-21 DIAGNOSIS — Z9981 Dependence on supplemental oxygen: Secondary | ICD-10-CM | POA: Diagnosis not present

## 2022-07-21 DIAGNOSIS — I2510 Atherosclerotic heart disease of native coronary artery without angina pectoris: Secondary | ICD-10-CM | POA: Diagnosis not present

## 2022-07-21 DIAGNOSIS — J440 Chronic obstructive pulmonary disease with acute lower respiratory infection: Secondary | ICD-10-CM | POA: Diagnosis not present

## 2022-07-21 DIAGNOSIS — I5021 Acute systolic (congestive) heart failure: Secondary | ICD-10-CM | POA: Diagnosis not present

## 2022-07-21 DIAGNOSIS — I2489 Other forms of acute ischemic heart disease: Secondary | ICD-10-CM | POA: Diagnosis not present

## 2022-07-21 DIAGNOSIS — N1831 Chronic kidney disease, stage 3a: Secondary | ICD-10-CM | POA: Diagnosis not present

## 2022-07-21 DIAGNOSIS — E46 Unspecified protein-calorie malnutrition: Secondary | ICD-10-CM | POA: Diagnosis not present

## 2022-07-21 DIAGNOSIS — H54413A Blindness right eye category 3, normal vision left eye: Secondary | ICD-10-CM | POA: Diagnosis not present

## 2022-07-21 DIAGNOSIS — R1310 Dysphagia, unspecified: Secondary | ICD-10-CM | POA: Diagnosis not present

## 2022-07-21 DIAGNOSIS — Z955 Presence of coronary angioplasty implant and graft: Secondary | ICD-10-CM | POA: Diagnosis not present

## 2022-07-21 DIAGNOSIS — J441 Chronic obstructive pulmonary disease with (acute) exacerbation: Secondary | ICD-10-CM | POA: Diagnosis not present

## 2022-07-21 DIAGNOSIS — D696 Thrombocytopenia, unspecified: Secondary | ICD-10-CM | POA: Diagnosis not present

## 2022-07-21 DIAGNOSIS — K219 Gastro-esophageal reflux disease without esophagitis: Secondary | ICD-10-CM | POA: Diagnosis not present

## 2022-07-21 DIAGNOSIS — F32A Depression, unspecified: Secondary | ICD-10-CM | POA: Diagnosis not present

## 2022-07-21 DIAGNOSIS — I252 Old myocardial infarction: Secondary | ICD-10-CM | POA: Diagnosis not present

## 2022-07-21 DIAGNOSIS — Z8673 Personal history of transient ischemic attack (TIA), and cerebral infarction without residual deficits: Secondary | ICD-10-CM | POA: Diagnosis not present

## 2022-07-23 DIAGNOSIS — F32A Depression, unspecified: Secondary | ICD-10-CM | POA: Diagnosis not present

## 2022-07-23 DIAGNOSIS — D649 Anemia, unspecified: Secondary | ICD-10-CM | POA: Diagnosis not present

## 2022-07-23 DIAGNOSIS — J441 Chronic obstructive pulmonary disease with (acute) exacerbation: Secondary | ICD-10-CM | POA: Diagnosis not present

## 2022-07-23 DIAGNOSIS — I5021 Acute systolic (congestive) heart failure: Secondary | ICD-10-CM | POA: Diagnosis not present

## 2022-07-23 DIAGNOSIS — Z955 Presence of coronary angioplasty implant and graft: Secondary | ICD-10-CM | POA: Diagnosis not present

## 2022-07-23 DIAGNOSIS — D696 Thrombocytopenia, unspecified: Secondary | ICD-10-CM | POA: Diagnosis not present

## 2022-07-23 DIAGNOSIS — I252 Old myocardial infarction: Secondary | ICD-10-CM | POA: Diagnosis not present

## 2022-07-23 DIAGNOSIS — Z8673 Personal history of transient ischemic attack (TIA), and cerebral infarction without residual deficits: Secondary | ICD-10-CM | POA: Diagnosis not present

## 2022-07-23 DIAGNOSIS — I2510 Atherosclerotic heart disease of native coronary artery without angina pectoris: Secondary | ICD-10-CM | POA: Diagnosis not present

## 2022-07-23 DIAGNOSIS — N1831 Chronic kidney disease, stage 3a: Secondary | ICD-10-CM | POA: Diagnosis not present

## 2022-07-23 DIAGNOSIS — K219 Gastro-esophageal reflux disease without esophagitis: Secondary | ICD-10-CM | POA: Diagnosis not present

## 2022-07-23 DIAGNOSIS — I2489 Other forms of acute ischemic heart disease: Secondary | ICD-10-CM | POA: Diagnosis not present

## 2022-07-23 DIAGNOSIS — J440 Chronic obstructive pulmonary disease with acute lower respiratory infection: Secondary | ICD-10-CM | POA: Diagnosis not present

## 2022-07-23 DIAGNOSIS — H54413A Blindness right eye category 3, normal vision left eye: Secondary | ICD-10-CM | POA: Diagnosis not present

## 2022-07-23 DIAGNOSIS — R1310 Dysphagia, unspecified: Secondary | ICD-10-CM | POA: Diagnosis not present

## 2022-07-23 DIAGNOSIS — Z9981 Dependence on supplemental oxygen: Secondary | ICD-10-CM | POA: Diagnosis not present

## 2022-07-23 DIAGNOSIS — Z87891 Personal history of nicotine dependence: Secondary | ICD-10-CM | POA: Diagnosis not present

## 2022-07-23 DIAGNOSIS — E46 Unspecified protein-calorie malnutrition: Secondary | ICD-10-CM | POA: Diagnosis not present

## 2022-07-23 DIAGNOSIS — I110 Hypertensive heart disease with heart failure: Secondary | ICD-10-CM | POA: Diagnosis not present

## 2022-07-24 DIAGNOSIS — K219 Gastro-esophageal reflux disease without esophagitis: Secondary | ICD-10-CM | POA: Diagnosis not present

## 2022-07-24 DIAGNOSIS — H54413A Blindness right eye category 3, normal vision left eye: Secondary | ICD-10-CM | POA: Diagnosis not present

## 2022-07-24 DIAGNOSIS — E46 Unspecified protein-calorie malnutrition: Secondary | ICD-10-CM | POA: Diagnosis not present

## 2022-07-24 DIAGNOSIS — Z8673 Personal history of transient ischemic attack (TIA), and cerebral infarction without residual deficits: Secondary | ICD-10-CM | POA: Diagnosis not present

## 2022-07-24 DIAGNOSIS — I110 Hypertensive heart disease with heart failure: Secondary | ICD-10-CM | POA: Diagnosis not present

## 2022-07-24 DIAGNOSIS — N1831 Chronic kidney disease, stage 3a: Secondary | ICD-10-CM | POA: Diagnosis not present

## 2022-07-24 DIAGNOSIS — Z955 Presence of coronary angioplasty implant and graft: Secondary | ICD-10-CM | POA: Diagnosis not present

## 2022-07-24 DIAGNOSIS — I2510 Atherosclerotic heart disease of native coronary artery without angina pectoris: Secondary | ICD-10-CM | POA: Diagnosis not present

## 2022-07-24 DIAGNOSIS — J441 Chronic obstructive pulmonary disease with (acute) exacerbation: Secondary | ICD-10-CM | POA: Diagnosis not present

## 2022-07-24 DIAGNOSIS — I252 Old myocardial infarction: Secondary | ICD-10-CM | POA: Diagnosis not present

## 2022-07-24 DIAGNOSIS — D649 Anemia, unspecified: Secondary | ICD-10-CM | POA: Diagnosis not present

## 2022-07-24 DIAGNOSIS — F32A Depression, unspecified: Secondary | ICD-10-CM | POA: Diagnosis not present

## 2022-07-24 DIAGNOSIS — Z9981 Dependence on supplemental oxygen: Secondary | ICD-10-CM | POA: Diagnosis not present

## 2022-07-24 DIAGNOSIS — I5021 Acute systolic (congestive) heart failure: Secondary | ICD-10-CM | POA: Diagnosis not present

## 2022-07-24 DIAGNOSIS — R1310 Dysphagia, unspecified: Secondary | ICD-10-CM | POA: Diagnosis not present

## 2022-07-24 DIAGNOSIS — D696 Thrombocytopenia, unspecified: Secondary | ICD-10-CM | POA: Diagnosis not present

## 2022-07-24 DIAGNOSIS — J440 Chronic obstructive pulmonary disease with acute lower respiratory infection: Secondary | ICD-10-CM | POA: Diagnosis not present

## 2022-07-24 DIAGNOSIS — I2489 Other forms of acute ischemic heart disease: Secondary | ICD-10-CM | POA: Diagnosis not present

## 2022-07-24 DIAGNOSIS — Z87891 Personal history of nicotine dependence: Secondary | ICD-10-CM | POA: Diagnosis not present

## 2022-07-27 DIAGNOSIS — J441 Chronic obstructive pulmonary disease with (acute) exacerbation: Secondary | ICD-10-CM | POA: Diagnosis not present

## 2022-07-27 DIAGNOSIS — I110 Hypertensive heart disease with heart failure: Secondary | ICD-10-CM | POA: Diagnosis not present

## 2022-07-27 DIAGNOSIS — D696 Thrombocytopenia, unspecified: Secondary | ICD-10-CM | POA: Diagnosis not present

## 2022-07-27 DIAGNOSIS — H54413A Blindness right eye category 3, normal vision left eye: Secondary | ICD-10-CM | POA: Diagnosis not present

## 2022-07-27 DIAGNOSIS — N1831 Chronic kidney disease, stage 3a: Secondary | ICD-10-CM | POA: Diagnosis not present

## 2022-07-27 DIAGNOSIS — Z955 Presence of coronary angioplasty implant and graft: Secondary | ICD-10-CM | POA: Diagnosis not present

## 2022-07-27 DIAGNOSIS — I252 Old myocardial infarction: Secondary | ICD-10-CM | POA: Diagnosis not present

## 2022-07-27 DIAGNOSIS — Z9981 Dependence on supplemental oxygen: Secondary | ICD-10-CM | POA: Diagnosis not present

## 2022-07-27 DIAGNOSIS — F32A Depression, unspecified: Secondary | ICD-10-CM | POA: Diagnosis not present

## 2022-07-27 DIAGNOSIS — Z87891 Personal history of nicotine dependence: Secondary | ICD-10-CM | POA: Diagnosis not present

## 2022-07-27 DIAGNOSIS — I2489 Other forms of acute ischemic heart disease: Secondary | ICD-10-CM | POA: Diagnosis not present

## 2022-07-27 DIAGNOSIS — Z8673 Personal history of transient ischemic attack (TIA), and cerebral infarction without residual deficits: Secondary | ICD-10-CM | POA: Diagnosis not present

## 2022-07-27 DIAGNOSIS — D649 Anemia, unspecified: Secondary | ICD-10-CM | POA: Diagnosis not present

## 2022-07-27 DIAGNOSIS — R1310 Dysphagia, unspecified: Secondary | ICD-10-CM | POA: Diagnosis not present

## 2022-07-27 DIAGNOSIS — K219 Gastro-esophageal reflux disease without esophagitis: Secondary | ICD-10-CM | POA: Diagnosis not present

## 2022-07-27 DIAGNOSIS — J440 Chronic obstructive pulmonary disease with acute lower respiratory infection: Secondary | ICD-10-CM | POA: Diagnosis not present

## 2022-07-27 DIAGNOSIS — E46 Unspecified protein-calorie malnutrition: Secondary | ICD-10-CM | POA: Diagnosis not present

## 2022-07-27 DIAGNOSIS — I5021 Acute systolic (congestive) heart failure: Secondary | ICD-10-CM | POA: Diagnosis not present

## 2022-07-27 DIAGNOSIS — I2510 Atherosclerotic heart disease of native coronary artery without angina pectoris: Secondary | ICD-10-CM | POA: Diagnosis not present

## 2022-07-28 DIAGNOSIS — I10 Essential (primary) hypertension: Secondary | ICD-10-CM | POA: Diagnosis not present

## 2022-07-28 DIAGNOSIS — N1831 Chronic kidney disease, stage 3a: Secondary | ICD-10-CM | POA: Diagnosis not present

## 2022-07-28 DIAGNOSIS — E46 Unspecified protein-calorie malnutrition: Secondary | ICD-10-CM | POA: Diagnosis not present

## 2022-07-28 DIAGNOSIS — D696 Thrombocytopenia, unspecified: Secondary | ICD-10-CM | POA: Diagnosis not present

## 2022-07-28 DIAGNOSIS — J9610 Chronic respiratory failure, unspecified whether with hypoxia or hypercapnia: Secondary | ICD-10-CM | POA: Diagnosis not present

## 2022-07-28 DIAGNOSIS — I5021 Acute systolic (congestive) heart failure: Secondary | ICD-10-CM | POA: Diagnosis not present

## 2022-07-28 DIAGNOSIS — J441 Chronic obstructive pulmonary disease with (acute) exacerbation: Secondary | ICD-10-CM | POA: Diagnosis not present

## 2022-07-28 DIAGNOSIS — I2510 Atherosclerotic heart disease of native coronary artery without angina pectoris: Secondary | ICD-10-CM | POA: Diagnosis not present

## 2022-07-28 DIAGNOSIS — J440 Chronic obstructive pulmonary disease with acute lower respiratory infection: Secondary | ICD-10-CM | POA: Diagnosis not present

## 2022-07-28 DIAGNOSIS — F32A Depression, unspecified: Secondary | ICD-10-CM | POA: Diagnosis not present

## 2022-07-28 DIAGNOSIS — Z955 Presence of coronary angioplasty implant and graft: Secondary | ICD-10-CM | POA: Diagnosis not present

## 2022-07-28 DIAGNOSIS — K219 Gastro-esophageal reflux disease without esophagitis: Secondary | ICD-10-CM | POA: Diagnosis not present

## 2022-07-28 DIAGNOSIS — J449 Chronic obstructive pulmonary disease, unspecified: Secondary | ICD-10-CM | POA: Diagnosis not present

## 2022-07-28 DIAGNOSIS — I2489 Other forms of acute ischemic heart disease: Secondary | ICD-10-CM | POA: Diagnosis not present

## 2022-07-28 DIAGNOSIS — Z9981 Dependence on supplemental oxygen: Secondary | ICD-10-CM | POA: Diagnosis not present

## 2022-07-28 DIAGNOSIS — Z8673 Personal history of transient ischemic attack (TIA), and cerebral infarction without residual deficits: Secondary | ICD-10-CM | POA: Diagnosis not present

## 2022-07-28 DIAGNOSIS — Z87891 Personal history of nicotine dependence: Secondary | ICD-10-CM | POA: Diagnosis not present

## 2022-07-28 DIAGNOSIS — I252 Old myocardial infarction: Secondary | ICD-10-CM | POA: Diagnosis not present

## 2022-07-28 DIAGNOSIS — I110 Hypertensive heart disease with heart failure: Secondary | ICD-10-CM | POA: Diagnosis not present

## 2022-07-28 DIAGNOSIS — D649 Anemia, unspecified: Secondary | ICD-10-CM | POA: Diagnosis not present

## 2022-07-28 DIAGNOSIS — H54413A Blindness right eye category 3, normal vision left eye: Secondary | ICD-10-CM | POA: Diagnosis not present

## 2022-07-28 DIAGNOSIS — R1310 Dysphagia, unspecified: Secondary | ICD-10-CM | POA: Diagnosis not present

## 2022-07-29 DIAGNOSIS — J440 Chronic obstructive pulmonary disease with acute lower respiratory infection: Secondary | ICD-10-CM | POA: Diagnosis not present

## 2022-07-29 DIAGNOSIS — I2510 Atherosclerotic heart disease of native coronary artery without angina pectoris: Secondary | ICD-10-CM | POA: Diagnosis not present

## 2022-07-29 DIAGNOSIS — Z9981 Dependence on supplemental oxygen: Secondary | ICD-10-CM | POA: Diagnosis not present

## 2022-07-29 DIAGNOSIS — I252 Old myocardial infarction: Secondary | ICD-10-CM | POA: Diagnosis not present

## 2022-07-29 DIAGNOSIS — J441 Chronic obstructive pulmonary disease with (acute) exacerbation: Secondary | ICD-10-CM | POA: Diagnosis not present

## 2022-07-29 DIAGNOSIS — I110 Hypertensive heart disease with heart failure: Secondary | ICD-10-CM | POA: Diagnosis not present

## 2022-07-29 DIAGNOSIS — H54413A Blindness right eye category 3, normal vision left eye: Secondary | ICD-10-CM | POA: Diagnosis not present

## 2022-07-29 DIAGNOSIS — I2489 Other forms of acute ischemic heart disease: Secondary | ICD-10-CM | POA: Diagnosis not present

## 2022-07-29 DIAGNOSIS — I5021 Acute systolic (congestive) heart failure: Secondary | ICD-10-CM | POA: Diagnosis not present

## 2022-07-29 DIAGNOSIS — Z8673 Personal history of transient ischemic attack (TIA), and cerebral infarction without residual deficits: Secondary | ICD-10-CM | POA: Diagnosis not present

## 2022-07-29 DIAGNOSIS — D649 Anemia, unspecified: Secondary | ICD-10-CM | POA: Diagnosis not present

## 2022-07-29 DIAGNOSIS — N1831 Chronic kidney disease, stage 3a: Secondary | ICD-10-CM | POA: Diagnosis not present

## 2022-07-29 DIAGNOSIS — K219 Gastro-esophageal reflux disease without esophagitis: Secondary | ICD-10-CM | POA: Diagnosis not present

## 2022-07-29 DIAGNOSIS — D696 Thrombocytopenia, unspecified: Secondary | ICD-10-CM | POA: Diagnosis not present

## 2022-07-29 DIAGNOSIS — E46 Unspecified protein-calorie malnutrition: Secondary | ICD-10-CM | POA: Diagnosis not present

## 2022-07-29 DIAGNOSIS — Z87891 Personal history of nicotine dependence: Secondary | ICD-10-CM | POA: Diagnosis not present

## 2022-07-29 DIAGNOSIS — F32A Depression, unspecified: Secondary | ICD-10-CM | POA: Diagnosis not present

## 2022-07-29 DIAGNOSIS — Z955 Presence of coronary angioplasty implant and graft: Secondary | ICD-10-CM | POA: Diagnosis not present

## 2022-07-29 DIAGNOSIS — R1310 Dysphagia, unspecified: Secondary | ICD-10-CM | POA: Diagnosis not present

## 2022-07-30 DIAGNOSIS — I252 Old myocardial infarction: Secondary | ICD-10-CM | POA: Diagnosis not present

## 2022-07-30 DIAGNOSIS — N1831 Chronic kidney disease, stage 3a: Secondary | ICD-10-CM | POA: Diagnosis not present

## 2022-07-30 DIAGNOSIS — J441 Chronic obstructive pulmonary disease with (acute) exacerbation: Secondary | ICD-10-CM | POA: Diagnosis not present

## 2022-07-30 DIAGNOSIS — Z8673 Personal history of transient ischemic attack (TIA), and cerebral infarction without residual deficits: Secondary | ICD-10-CM | POA: Diagnosis not present

## 2022-07-30 DIAGNOSIS — E46 Unspecified protein-calorie malnutrition: Secondary | ICD-10-CM | POA: Diagnosis not present

## 2022-07-30 DIAGNOSIS — I2489 Other forms of acute ischemic heart disease: Secondary | ICD-10-CM | POA: Diagnosis not present

## 2022-07-30 DIAGNOSIS — K219 Gastro-esophageal reflux disease without esophagitis: Secondary | ICD-10-CM | POA: Diagnosis not present

## 2022-07-30 DIAGNOSIS — I5021 Acute systolic (congestive) heart failure: Secondary | ICD-10-CM | POA: Diagnosis not present

## 2022-07-30 DIAGNOSIS — I110 Hypertensive heart disease with heart failure: Secondary | ICD-10-CM | POA: Diagnosis not present

## 2022-07-30 DIAGNOSIS — J440 Chronic obstructive pulmonary disease with acute lower respiratory infection: Secondary | ICD-10-CM | POA: Diagnosis not present

## 2022-07-30 DIAGNOSIS — Z87891 Personal history of nicotine dependence: Secondary | ICD-10-CM | POA: Diagnosis not present

## 2022-07-30 DIAGNOSIS — Z955 Presence of coronary angioplasty implant and graft: Secondary | ICD-10-CM | POA: Diagnosis not present

## 2022-07-30 DIAGNOSIS — R1310 Dysphagia, unspecified: Secondary | ICD-10-CM | POA: Diagnosis not present

## 2022-07-30 DIAGNOSIS — D696 Thrombocytopenia, unspecified: Secondary | ICD-10-CM | POA: Diagnosis not present

## 2022-07-30 DIAGNOSIS — F32A Depression, unspecified: Secondary | ICD-10-CM | POA: Diagnosis not present

## 2022-07-30 DIAGNOSIS — H54413A Blindness right eye category 3, normal vision left eye: Secondary | ICD-10-CM | POA: Diagnosis not present

## 2022-07-30 DIAGNOSIS — I2510 Atherosclerotic heart disease of native coronary artery without angina pectoris: Secondary | ICD-10-CM | POA: Diagnosis not present

## 2022-07-30 DIAGNOSIS — Z9981 Dependence on supplemental oxygen: Secondary | ICD-10-CM | POA: Diagnosis not present

## 2022-07-30 DIAGNOSIS — D649 Anemia, unspecified: Secondary | ICD-10-CM | POA: Diagnosis not present

## 2022-07-31 DIAGNOSIS — J440 Chronic obstructive pulmonary disease with acute lower respiratory infection: Secondary | ICD-10-CM | POA: Diagnosis not present

## 2022-07-31 DIAGNOSIS — J441 Chronic obstructive pulmonary disease with (acute) exacerbation: Secondary | ICD-10-CM | POA: Diagnosis not present

## 2022-07-31 DIAGNOSIS — I110 Hypertensive heart disease with heart failure: Secondary | ICD-10-CM | POA: Diagnosis not present

## 2022-07-31 DIAGNOSIS — Z9981 Dependence on supplemental oxygen: Secondary | ICD-10-CM | POA: Diagnosis not present

## 2022-07-31 DIAGNOSIS — Z87891 Personal history of nicotine dependence: Secondary | ICD-10-CM | POA: Diagnosis not present

## 2022-07-31 DIAGNOSIS — I5021 Acute systolic (congestive) heart failure: Secondary | ICD-10-CM | POA: Diagnosis not present

## 2022-07-31 DIAGNOSIS — N1831 Chronic kidney disease, stage 3a: Secondary | ICD-10-CM | POA: Diagnosis not present

## 2022-07-31 DIAGNOSIS — D649 Anemia, unspecified: Secondary | ICD-10-CM | POA: Diagnosis not present

## 2022-07-31 DIAGNOSIS — I2510 Atherosclerotic heart disease of native coronary artery without angina pectoris: Secondary | ICD-10-CM | POA: Diagnosis not present

## 2022-07-31 DIAGNOSIS — F32A Depression, unspecified: Secondary | ICD-10-CM | POA: Diagnosis not present

## 2022-07-31 DIAGNOSIS — D696 Thrombocytopenia, unspecified: Secondary | ICD-10-CM | POA: Diagnosis not present

## 2022-07-31 DIAGNOSIS — R1310 Dysphagia, unspecified: Secondary | ICD-10-CM | POA: Diagnosis not present

## 2022-07-31 DIAGNOSIS — I252 Old myocardial infarction: Secondary | ICD-10-CM | POA: Diagnosis not present

## 2022-07-31 DIAGNOSIS — H54413A Blindness right eye category 3, normal vision left eye: Secondary | ICD-10-CM | POA: Diagnosis not present

## 2022-07-31 DIAGNOSIS — Z955 Presence of coronary angioplasty implant and graft: Secondary | ICD-10-CM | POA: Diagnosis not present

## 2022-07-31 DIAGNOSIS — I2489 Other forms of acute ischemic heart disease: Secondary | ICD-10-CM | POA: Diagnosis not present

## 2022-07-31 DIAGNOSIS — K219 Gastro-esophageal reflux disease without esophagitis: Secondary | ICD-10-CM | POA: Diagnosis not present

## 2022-07-31 DIAGNOSIS — Z8673 Personal history of transient ischemic attack (TIA), and cerebral infarction without residual deficits: Secondary | ICD-10-CM | POA: Diagnosis not present

## 2022-07-31 DIAGNOSIS — E46 Unspecified protein-calorie malnutrition: Secondary | ICD-10-CM | POA: Diagnosis not present

## 2022-08-03 DIAGNOSIS — I2510 Atherosclerotic heart disease of native coronary artery without angina pectoris: Secondary | ICD-10-CM | POA: Diagnosis not present

## 2022-08-03 DIAGNOSIS — I2489 Other forms of acute ischemic heart disease: Secondary | ICD-10-CM | POA: Diagnosis not present

## 2022-08-03 DIAGNOSIS — H54413A Blindness right eye category 3, normal vision left eye: Secondary | ICD-10-CM | POA: Diagnosis not present

## 2022-08-03 DIAGNOSIS — D649 Anemia, unspecified: Secondary | ICD-10-CM | POA: Diagnosis not present

## 2022-08-03 DIAGNOSIS — N1831 Chronic kidney disease, stage 3a: Secondary | ICD-10-CM | POA: Diagnosis not present

## 2022-08-03 DIAGNOSIS — R1310 Dysphagia, unspecified: Secondary | ICD-10-CM | POA: Diagnosis not present

## 2022-08-03 DIAGNOSIS — J441 Chronic obstructive pulmonary disease with (acute) exacerbation: Secondary | ICD-10-CM | POA: Diagnosis not present

## 2022-08-03 DIAGNOSIS — F32A Depression, unspecified: Secondary | ICD-10-CM | POA: Diagnosis not present

## 2022-08-03 DIAGNOSIS — K219 Gastro-esophageal reflux disease without esophagitis: Secondary | ICD-10-CM | POA: Diagnosis not present

## 2022-08-03 DIAGNOSIS — D696 Thrombocytopenia, unspecified: Secondary | ICD-10-CM | POA: Diagnosis not present

## 2022-08-03 DIAGNOSIS — I252 Old myocardial infarction: Secondary | ICD-10-CM | POA: Diagnosis not present

## 2022-08-03 DIAGNOSIS — I110 Hypertensive heart disease with heart failure: Secondary | ICD-10-CM | POA: Diagnosis not present

## 2022-08-03 DIAGNOSIS — Z955 Presence of coronary angioplasty implant and graft: Secondary | ICD-10-CM | POA: Diagnosis not present

## 2022-08-03 DIAGNOSIS — E46 Unspecified protein-calorie malnutrition: Secondary | ICD-10-CM | POA: Diagnosis not present

## 2022-08-03 DIAGNOSIS — Z8673 Personal history of transient ischemic attack (TIA), and cerebral infarction without residual deficits: Secondary | ICD-10-CM | POA: Diagnosis not present

## 2022-08-03 DIAGNOSIS — J440 Chronic obstructive pulmonary disease with acute lower respiratory infection: Secondary | ICD-10-CM | POA: Diagnosis not present

## 2022-08-03 DIAGNOSIS — I5021 Acute systolic (congestive) heart failure: Secondary | ICD-10-CM | POA: Diagnosis not present

## 2022-08-03 DIAGNOSIS — Z9981 Dependence on supplemental oxygen: Secondary | ICD-10-CM | POA: Diagnosis not present

## 2022-08-03 DIAGNOSIS — Z87891 Personal history of nicotine dependence: Secondary | ICD-10-CM | POA: Diagnosis not present

## 2022-08-04 DIAGNOSIS — Z8673 Personal history of transient ischemic attack (TIA), and cerebral infarction without residual deficits: Secondary | ICD-10-CM | POA: Diagnosis not present

## 2022-08-04 DIAGNOSIS — D649 Anemia, unspecified: Secondary | ICD-10-CM | POA: Diagnosis not present

## 2022-08-04 DIAGNOSIS — N1831 Chronic kidney disease, stage 3a: Secondary | ICD-10-CM | POA: Diagnosis not present

## 2022-08-04 DIAGNOSIS — I252 Old myocardial infarction: Secondary | ICD-10-CM | POA: Diagnosis not present

## 2022-08-04 DIAGNOSIS — I2489 Other forms of acute ischemic heart disease: Secondary | ICD-10-CM | POA: Diagnosis not present

## 2022-08-04 DIAGNOSIS — J441 Chronic obstructive pulmonary disease with (acute) exacerbation: Secondary | ICD-10-CM | POA: Diagnosis not present

## 2022-08-04 DIAGNOSIS — J440 Chronic obstructive pulmonary disease with acute lower respiratory infection: Secondary | ICD-10-CM | POA: Diagnosis not present

## 2022-08-04 DIAGNOSIS — I110 Hypertensive heart disease with heart failure: Secondary | ICD-10-CM | POA: Diagnosis not present

## 2022-08-04 DIAGNOSIS — K219 Gastro-esophageal reflux disease without esophagitis: Secondary | ICD-10-CM | POA: Diagnosis not present

## 2022-08-04 DIAGNOSIS — F32A Depression, unspecified: Secondary | ICD-10-CM | POA: Diagnosis not present

## 2022-08-04 DIAGNOSIS — Z9981 Dependence on supplemental oxygen: Secondary | ICD-10-CM | POA: Diagnosis not present

## 2022-08-04 DIAGNOSIS — I2510 Atherosclerotic heart disease of native coronary artery without angina pectoris: Secondary | ICD-10-CM | POA: Diagnosis not present

## 2022-08-04 DIAGNOSIS — H54413A Blindness right eye category 3, normal vision left eye: Secondary | ICD-10-CM | POA: Diagnosis not present

## 2022-08-04 DIAGNOSIS — Z955 Presence of coronary angioplasty implant and graft: Secondary | ICD-10-CM | POA: Diagnosis not present

## 2022-08-04 DIAGNOSIS — E46 Unspecified protein-calorie malnutrition: Secondary | ICD-10-CM | POA: Diagnosis not present

## 2022-08-04 DIAGNOSIS — D696 Thrombocytopenia, unspecified: Secondary | ICD-10-CM | POA: Diagnosis not present

## 2022-08-04 DIAGNOSIS — Z87891 Personal history of nicotine dependence: Secondary | ICD-10-CM | POA: Diagnosis not present

## 2022-08-04 DIAGNOSIS — I5021 Acute systolic (congestive) heart failure: Secondary | ICD-10-CM | POA: Diagnosis not present

## 2022-08-04 DIAGNOSIS — R1310 Dysphagia, unspecified: Secondary | ICD-10-CM | POA: Diagnosis not present

## 2022-08-05 DIAGNOSIS — I2489 Other forms of acute ischemic heart disease: Secondary | ICD-10-CM | POA: Diagnosis not present

## 2022-08-05 DIAGNOSIS — D649 Anemia, unspecified: Secondary | ICD-10-CM | POA: Diagnosis not present

## 2022-08-05 DIAGNOSIS — J440 Chronic obstructive pulmonary disease with acute lower respiratory infection: Secondary | ICD-10-CM | POA: Diagnosis not present

## 2022-08-05 DIAGNOSIS — I2510 Atherosclerotic heart disease of native coronary artery without angina pectoris: Secondary | ICD-10-CM | POA: Diagnosis not present

## 2022-08-05 DIAGNOSIS — K219 Gastro-esophageal reflux disease without esophagitis: Secondary | ICD-10-CM | POA: Diagnosis not present

## 2022-08-05 DIAGNOSIS — N1831 Chronic kidney disease, stage 3a: Secondary | ICD-10-CM | POA: Diagnosis not present

## 2022-08-05 DIAGNOSIS — Z8673 Personal history of transient ischemic attack (TIA), and cerebral infarction without residual deficits: Secondary | ICD-10-CM | POA: Diagnosis not present

## 2022-08-05 DIAGNOSIS — D696 Thrombocytopenia, unspecified: Secondary | ICD-10-CM | POA: Diagnosis not present

## 2022-08-05 DIAGNOSIS — J441 Chronic obstructive pulmonary disease with (acute) exacerbation: Secondary | ICD-10-CM | POA: Diagnosis not present

## 2022-08-05 DIAGNOSIS — R1310 Dysphagia, unspecified: Secondary | ICD-10-CM | POA: Diagnosis not present

## 2022-08-05 DIAGNOSIS — F32A Depression, unspecified: Secondary | ICD-10-CM | POA: Diagnosis not present

## 2022-08-05 DIAGNOSIS — Z87891 Personal history of nicotine dependence: Secondary | ICD-10-CM | POA: Diagnosis not present

## 2022-08-05 DIAGNOSIS — I252 Old myocardial infarction: Secondary | ICD-10-CM | POA: Diagnosis not present

## 2022-08-05 DIAGNOSIS — I5021 Acute systolic (congestive) heart failure: Secondary | ICD-10-CM | POA: Diagnosis not present

## 2022-08-05 DIAGNOSIS — I110 Hypertensive heart disease with heart failure: Secondary | ICD-10-CM | POA: Diagnosis not present

## 2022-08-05 DIAGNOSIS — H54413A Blindness right eye category 3, normal vision left eye: Secondary | ICD-10-CM | POA: Diagnosis not present

## 2022-08-05 DIAGNOSIS — Z9981 Dependence on supplemental oxygen: Secondary | ICD-10-CM | POA: Diagnosis not present

## 2022-08-05 DIAGNOSIS — E46 Unspecified protein-calorie malnutrition: Secondary | ICD-10-CM | POA: Diagnosis not present

## 2022-08-05 DIAGNOSIS — Z955 Presence of coronary angioplasty implant and graft: Secondary | ICD-10-CM | POA: Diagnosis not present

## 2022-08-06 DIAGNOSIS — H54413A Blindness right eye category 3, normal vision left eye: Secondary | ICD-10-CM | POA: Diagnosis not present

## 2022-08-06 DIAGNOSIS — J440 Chronic obstructive pulmonary disease with acute lower respiratory infection: Secondary | ICD-10-CM | POA: Diagnosis not present

## 2022-08-06 DIAGNOSIS — Z87891 Personal history of nicotine dependence: Secondary | ICD-10-CM | POA: Diagnosis not present

## 2022-08-06 DIAGNOSIS — N1831 Chronic kidney disease, stage 3a: Secondary | ICD-10-CM | POA: Diagnosis not present

## 2022-08-06 DIAGNOSIS — D649 Anemia, unspecified: Secondary | ICD-10-CM | POA: Diagnosis not present

## 2022-08-06 DIAGNOSIS — J441 Chronic obstructive pulmonary disease with (acute) exacerbation: Secondary | ICD-10-CM | POA: Diagnosis not present

## 2022-08-06 DIAGNOSIS — Z9981 Dependence on supplemental oxygen: Secondary | ICD-10-CM | POA: Diagnosis not present

## 2022-08-06 DIAGNOSIS — Z955 Presence of coronary angioplasty implant and graft: Secondary | ICD-10-CM | POA: Diagnosis not present

## 2022-08-06 DIAGNOSIS — E46 Unspecified protein-calorie malnutrition: Secondary | ICD-10-CM | POA: Diagnosis not present

## 2022-08-06 DIAGNOSIS — I2510 Atherosclerotic heart disease of native coronary artery without angina pectoris: Secondary | ICD-10-CM | POA: Diagnosis not present

## 2022-08-06 DIAGNOSIS — I252 Old myocardial infarction: Secondary | ICD-10-CM | POA: Diagnosis not present

## 2022-08-06 DIAGNOSIS — R1310 Dysphagia, unspecified: Secondary | ICD-10-CM | POA: Diagnosis not present

## 2022-08-06 DIAGNOSIS — Z8673 Personal history of transient ischemic attack (TIA), and cerebral infarction without residual deficits: Secondary | ICD-10-CM | POA: Diagnosis not present

## 2022-08-06 DIAGNOSIS — D696 Thrombocytopenia, unspecified: Secondary | ICD-10-CM | POA: Diagnosis not present

## 2022-08-06 DIAGNOSIS — K219 Gastro-esophageal reflux disease without esophagitis: Secondary | ICD-10-CM | POA: Diagnosis not present

## 2022-08-06 DIAGNOSIS — F32A Depression, unspecified: Secondary | ICD-10-CM | POA: Diagnosis not present

## 2022-08-06 DIAGNOSIS — I5021 Acute systolic (congestive) heart failure: Secondary | ICD-10-CM | POA: Diagnosis not present

## 2022-08-06 DIAGNOSIS — I2489 Other forms of acute ischemic heart disease: Secondary | ICD-10-CM | POA: Diagnosis not present

## 2022-08-06 DIAGNOSIS — I110 Hypertensive heart disease with heart failure: Secondary | ICD-10-CM | POA: Diagnosis not present

## 2022-08-07 DIAGNOSIS — N1831 Chronic kidney disease, stage 3a: Secondary | ICD-10-CM | POA: Diagnosis not present

## 2022-08-07 DIAGNOSIS — I5021 Acute systolic (congestive) heart failure: Secondary | ICD-10-CM | POA: Diagnosis not present

## 2022-08-07 DIAGNOSIS — I252 Old myocardial infarction: Secondary | ICD-10-CM | POA: Diagnosis not present

## 2022-08-07 DIAGNOSIS — E46 Unspecified protein-calorie malnutrition: Secondary | ICD-10-CM | POA: Diagnosis not present

## 2022-08-07 DIAGNOSIS — H54413A Blindness right eye category 3, normal vision left eye: Secondary | ICD-10-CM | POA: Diagnosis not present

## 2022-08-07 DIAGNOSIS — Z9981 Dependence on supplemental oxygen: Secondary | ICD-10-CM | POA: Diagnosis not present

## 2022-08-07 DIAGNOSIS — Z8673 Personal history of transient ischemic attack (TIA), and cerebral infarction without residual deficits: Secondary | ICD-10-CM | POA: Diagnosis not present

## 2022-08-07 DIAGNOSIS — R1310 Dysphagia, unspecified: Secondary | ICD-10-CM | POA: Diagnosis not present

## 2022-08-07 DIAGNOSIS — J440 Chronic obstructive pulmonary disease with acute lower respiratory infection: Secondary | ICD-10-CM | POA: Diagnosis not present

## 2022-08-07 DIAGNOSIS — I2510 Atherosclerotic heart disease of native coronary artery without angina pectoris: Secondary | ICD-10-CM | POA: Diagnosis not present

## 2022-08-07 DIAGNOSIS — I110 Hypertensive heart disease with heart failure: Secondary | ICD-10-CM | POA: Diagnosis not present

## 2022-08-07 DIAGNOSIS — D649 Anemia, unspecified: Secondary | ICD-10-CM | POA: Diagnosis not present

## 2022-08-07 DIAGNOSIS — J441 Chronic obstructive pulmonary disease with (acute) exacerbation: Secondary | ICD-10-CM | POA: Diagnosis not present

## 2022-08-07 DIAGNOSIS — I2489 Other forms of acute ischemic heart disease: Secondary | ICD-10-CM | POA: Diagnosis not present

## 2022-08-07 DIAGNOSIS — Z87891 Personal history of nicotine dependence: Secondary | ICD-10-CM | POA: Diagnosis not present

## 2022-08-07 DIAGNOSIS — K219 Gastro-esophageal reflux disease without esophagitis: Secondary | ICD-10-CM | POA: Diagnosis not present

## 2022-08-07 DIAGNOSIS — Z955 Presence of coronary angioplasty implant and graft: Secondary | ICD-10-CM | POA: Diagnosis not present

## 2022-08-07 DIAGNOSIS — F32A Depression, unspecified: Secondary | ICD-10-CM | POA: Diagnosis not present

## 2022-08-07 DIAGNOSIS — D696 Thrombocytopenia, unspecified: Secondary | ICD-10-CM | POA: Diagnosis not present

## 2022-08-10 DIAGNOSIS — I252 Old myocardial infarction: Secondary | ICD-10-CM | POA: Diagnosis not present

## 2022-08-10 DIAGNOSIS — F32A Depression, unspecified: Secondary | ICD-10-CM | POA: Diagnosis not present

## 2022-08-10 DIAGNOSIS — D696 Thrombocytopenia, unspecified: Secondary | ICD-10-CM | POA: Diagnosis not present

## 2022-08-10 DIAGNOSIS — R1310 Dysphagia, unspecified: Secondary | ICD-10-CM | POA: Diagnosis not present

## 2022-08-10 DIAGNOSIS — J441 Chronic obstructive pulmonary disease with (acute) exacerbation: Secondary | ICD-10-CM | POA: Diagnosis not present

## 2022-08-10 DIAGNOSIS — J440 Chronic obstructive pulmonary disease with acute lower respiratory infection: Secondary | ICD-10-CM | POA: Diagnosis not present

## 2022-08-10 DIAGNOSIS — E46 Unspecified protein-calorie malnutrition: Secondary | ICD-10-CM | POA: Diagnosis not present

## 2022-08-10 DIAGNOSIS — Z9981 Dependence on supplemental oxygen: Secondary | ICD-10-CM | POA: Diagnosis not present

## 2022-08-10 DIAGNOSIS — N1831 Chronic kidney disease, stage 3a: Secondary | ICD-10-CM | POA: Diagnosis not present

## 2022-08-10 DIAGNOSIS — I110 Hypertensive heart disease with heart failure: Secondary | ICD-10-CM | POA: Diagnosis not present

## 2022-08-10 DIAGNOSIS — Z8673 Personal history of transient ischemic attack (TIA), and cerebral infarction without residual deficits: Secondary | ICD-10-CM | POA: Diagnosis not present

## 2022-08-10 DIAGNOSIS — K219 Gastro-esophageal reflux disease without esophagitis: Secondary | ICD-10-CM | POA: Diagnosis not present

## 2022-08-10 DIAGNOSIS — D649 Anemia, unspecified: Secondary | ICD-10-CM | POA: Diagnosis not present

## 2022-08-10 DIAGNOSIS — Z87891 Personal history of nicotine dependence: Secondary | ICD-10-CM | POA: Diagnosis not present

## 2022-08-10 DIAGNOSIS — I2489 Other forms of acute ischemic heart disease: Secondary | ICD-10-CM | POA: Diagnosis not present

## 2022-08-10 DIAGNOSIS — H54413A Blindness right eye category 3, normal vision left eye: Secondary | ICD-10-CM | POA: Diagnosis not present

## 2022-08-10 DIAGNOSIS — I2510 Atherosclerotic heart disease of native coronary artery without angina pectoris: Secondary | ICD-10-CM | POA: Diagnosis not present

## 2022-08-10 DIAGNOSIS — Z955 Presence of coronary angioplasty implant and graft: Secondary | ICD-10-CM | POA: Diagnosis not present

## 2022-08-10 DIAGNOSIS — I5021 Acute systolic (congestive) heart failure: Secondary | ICD-10-CM | POA: Diagnosis not present

## 2022-08-14 DIAGNOSIS — F32A Depression, unspecified: Secondary | ICD-10-CM | POA: Diagnosis not present

## 2022-08-14 DIAGNOSIS — Z8673 Personal history of transient ischemic attack (TIA), and cerebral infarction without residual deficits: Secondary | ICD-10-CM | POA: Diagnosis not present

## 2022-08-14 DIAGNOSIS — J440 Chronic obstructive pulmonary disease with acute lower respiratory infection: Secondary | ICD-10-CM | POA: Diagnosis not present

## 2022-08-14 DIAGNOSIS — I5021 Acute systolic (congestive) heart failure: Secondary | ICD-10-CM | POA: Diagnosis not present

## 2022-08-14 DIAGNOSIS — Z955 Presence of coronary angioplasty implant and graft: Secondary | ICD-10-CM | POA: Diagnosis not present

## 2022-08-14 DIAGNOSIS — D696 Thrombocytopenia, unspecified: Secondary | ICD-10-CM | POA: Diagnosis not present

## 2022-08-14 DIAGNOSIS — I110 Hypertensive heart disease with heart failure: Secondary | ICD-10-CM | POA: Diagnosis not present

## 2022-08-14 DIAGNOSIS — E46 Unspecified protein-calorie malnutrition: Secondary | ICD-10-CM | POA: Diagnosis not present

## 2022-08-14 DIAGNOSIS — N1831 Chronic kidney disease, stage 3a: Secondary | ICD-10-CM | POA: Diagnosis not present

## 2022-08-14 DIAGNOSIS — I2489 Other forms of acute ischemic heart disease: Secondary | ICD-10-CM | POA: Diagnosis not present

## 2022-08-14 DIAGNOSIS — I252 Old myocardial infarction: Secondary | ICD-10-CM | POA: Diagnosis not present

## 2022-08-14 DIAGNOSIS — I2510 Atherosclerotic heart disease of native coronary artery without angina pectoris: Secondary | ICD-10-CM | POA: Diagnosis not present

## 2022-08-14 DIAGNOSIS — Z87891 Personal history of nicotine dependence: Secondary | ICD-10-CM | POA: Diagnosis not present

## 2022-08-14 DIAGNOSIS — K219 Gastro-esophageal reflux disease without esophagitis: Secondary | ICD-10-CM | POA: Diagnosis not present

## 2022-08-14 DIAGNOSIS — D649 Anemia, unspecified: Secondary | ICD-10-CM | POA: Diagnosis not present

## 2022-08-14 DIAGNOSIS — J441 Chronic obstructive pulmonary disease with (acute) exacerbation: Secondary | ICD-10-CM | POA: Diagnosis not present

## 2022-08-14 DIAGNOSIS — H54413A Blindness right eye category 3, normal vision left eye: Secondary | ICD-10-CM | POA: Diagnosis not present

## 2022-08-14 DIAGNOSIS — R1310 Dysphagia, unspecified: Secondary | ICD-10-CM | POA: Diagnosis not present

## 2022-08-14 DIAGNOSIS — Z9981 Dependence on supplemental oxygen: Secondary | ICD-10-CM | POA: Diagnosis not present

## 2022-08-15 DIAGNOSIS — Z87891 Personal history of nicotine dependence: Secondary | ICD-10-CM | POA: Diagnosis not present

## 2022-08-15 DIAGNOSIS — Z955 Presence of coronary angioplasty implant and graft: Secondary | ICD-10-CM | POA: Diagnosis not present

## 2022-08-15 DIAGNOSIS — N1831 Chronic kidney disease, stage 3a: Secondary | ICD-10-CM | POA: Diagnosis not present

## 2022-08-15 DIAGNOSIS — F32A Depression, unspecified: Secondary | ICD-10-CM | POA: Diagnosis not present

## 2022-08-15 DIAGNOSIS — D696 Thrombocytopenia, unspecified: Secondary | ICD-10-CM | POA: Diagnosis not present

## 2022-08-15 DIAGNOSIS — I2510 Atherosclerotic heart disease of native coronary artery without angina pectoris: Secondary | ICD-10-CM | POA: Diagnosis not present

## 2022-08-15 DIAGNOSIS — Z8673 Personal history of transient ischemic attack (TIA), and cerebral infarction without residual deficits: Secondary | ICD-10-CM | POA: Diagnosis not present

## 2022-08-15 DIAGNOSIS — I5021 Acute systolic (congestive) heart failure: Secondary | ICD-10-CM | POA: Diagnosis not present

## 2022-08-15 DIAGNOSIS — I110 Hypertensive heart disease with heart failure: Secondary | ICD-10-CM | POA: Diagnosis not present

## 2022-08-15 DIAGNOSIS — K219 Gastro-esophageal reflux disease without esophagitis: Secondary | ICD-10-CM | POA: Diagnosis not present

## 2022-08-15 DIAGNOSIS — I252 Old myocardial infarction: Secondary | ICD-10-CM | POA: Diagnosis not present

## 2022-08-15 DIAGNOSIS — D649 Anemia, unspecified: Secondary | ICD-10-CM | POA: Diagnosis not present

## 2022-08-15 DIAGNOSIS — E46 Unspecified protein-calorie malnutrition: Secondary | ICD-10-CM | POA: Diagnosis not present

## 2022-08-15 DIAGNOSIS — H54413A Blindness right eye category 3, normal vision left eye: Secondary | ICD-10-CM | POA: Diagnosis not present

## 2022-08-15 DIAGNOSIS — R1310 Dysphagia, unspecified: Secondary | ICD-10-CM | POA: Diagnosis not present

## 2022-08-15 DIAGNOSIS — Z9981 Dependence on supplemental oxygen: Secondary | ICD-10-CM | POA: Diagnosis not present

## 2022-08-15 DIAGNOSIS — J440 Chronic obstructive pulmonary disease with acute lower respiratory infection: Secondary | ICD-10-CM | POA: Diagnosis not present

## 2022-08-15 DIAGNOSIS — J441 Chronic obstructive pulmonary disease with (acute) exacerbation: Secondary | ICD-10-CM | POA: Diagnosis not present

## 2022-08-15 DIAGNOSIS — I2489 Other forms of acute ischemic heart disease: Secondary | ICD-10-CM | POA: Diagnosis not present

## 2022-08-17 DIAGNOSIS — Z8673 Personal history of transient ischemic attack (TIA), and cerebral infarction without residual deficits: Secondary | ICD-10-CM | POA: Diagnosis not present

## 2022-08-17 DIAGNOSIS — J441 Chronic obstructive pulmonary disease with (acute) exacerbation: Secondary | ICD-10-CM | POA: Diagnosis not present

## 2022-08-17 DIAGNOSIS — D696 Thrombocytopenia, unspecified: Secondary | ICD-10-CM | POA: Diagnosis not present

## 2022-08-17 DIAGNOSIS — N1831 Chronic kidney disease, stage 3a: Secondary | ICD-10-CM | POA: Diagnosis not present

## 2022-08-17 DIAGNOSIS — Z87891 Personal history of nicotine dependence: Secondary | ICD-10-CM | POA: Diagnosis not present

## 2022-08-17 DIAGNOSIS — I2510 Atherosclerotic heart disease of native coronary artery without angina pectoris: Secondary | ICD-10-CM | POA: Diagnosis not present

## 2022-08-17 DIAGNOSIS — F32A Depression, unspecified: Secondary | ICD-10-CM | POA: Diagnosis not present

## 2022-08-17 DIAGNOSIS — R1310 Dysphagia, unspecified: Secondary | ICD-10-CM | POA: Diagnosis not present

## 2022-08-17 DIAGNOSIS — E46 Unspecified protein-calorie malnutrition: Secondary | ICD-10-CM | POA: Diagnosis not present

## 2022-08-17 DIAGNOSIS — K219 Gastro-esophageal reflux disease without esophagitis: Secondary | ICD-10-CM | POA: Diagnosis not present

## 2022-08-17 DIAGNOSIS — I2489 Other forms of acute ischemic heart disease: Secondary | ICD-10-CM | POA: Diagnosis not present

## 2022-08-17 DIAGNOSIS — D649 Anemia, unspecified: Secondary | ICD-10-CM | POA: Diagnosis not present

## 2022-08-17 DIAGNOSIS — J440 Chronic obstructive pulmonary disease with acute lower respiratory infection: Secondary | ICD-10-CM | POA: Diagnosis not present

## 2022-08-17 DIAGNOSIS — I110 Hypertensive heart disease with heart failure: Secondary | ICD-10-CM | POA: Diagnosis not present

## 2022-08-17 DIAGNOSIS — I252 Old myocardial infarction: Secondary | ICD-10-CM | POA: Diagnosis not present

## 2022-08-17 DIAGNOSIS — Z9981 Dependence on supplemental oxygen: Secondary | ICD-10-CM | POA: Diagnosis not present

## 2022-08-17 DIAGNOSIS — H54413A Blindness right eye category 3, normal vision left eye: Secondary | ICD-10-CM | POA: Diagnosis not present

## 2022-08-17 DIAGNOSIS — I5021 Acute systolic (congestive) heart failure: Secondary | ICD-10-CM | POA: Diagnosis not present

## 2022-08-17 DIAGNOSIS — Z955 Presence of coronary angioplasty implant and graft: Secondary | ICD-10-CM | POA: Diagnosis not present

## 2022-08-19 DIAGNOSIS — I5021 Acute systolic (congestive) heart failure: Secondary | ICD-10-CM | POA: Diagnosis not present

## 2022-08-19 DIAGNOSIS — J441 Chronic obstructive pulmonary disease with (acute) exacerbation: Secondary | ICD-10-CM | POA: Diagnosis not present

## 2022-08-19 DIAGNOSIS — D649 Anemia, unspecified: Secondary | ICD-10-CM | POA: Diagnosis not present

## 2022-08-19 DIAGNOSIS — R1310 Dysphagia, unspecified: Secondary | ICD-10-CM | POA: Diagnosis not present

## 2022-08-19 DIAGNOSIS — E46 Unspecified protein-calorie malnutrition: Secondary | ICD-10-CM | POA: Diagnosis not present

## 2022-08-19 DIAGNOSIS — F32A Depression, unspecified: Secondary | ICD-10-CM | POA: Diagnosis not present

## 2022-08-19 DIAGNOSIS — H54413A Blindness right eye category 3, normal vision left eye: Secondary | ICD-10-CM | POA: Diagnosis not present

## 2022-08-19 DIAGNOSIS — N1831 Chronic kidney disease, stage 3a: Secondary | ICD-10-CM | POA: Diagnosis not present

## 2022-08-19 DIAGNOSIS — J440 Chronic obstructive pulmonary disease with acute lower respiratory infection: Secondary | ICD-10-CM | POA: Diagnosis not present

## 2022-08-19 DIAGNOSIS — Z955 Presence of coronary angioplasty implant and graft: Secondary | ICD-10-CM | POA: Diagnosis not present

## 2022-08-19 DIAGNOSIS — Z9981 Dependence on supplemental oxygen: Secondary | ICD-10-CM | POA: Diagnosis not present

## 2022-08-19 DIAGNOSIS — Z87891 Personal history of nicotine dependence: Secondary | ICD-10-CM | POA: Diagnosis not present

## 2022-08-19 DIAGNOSIS — K219 Gastro-esophageal reflux disease without esophagitis: Secondary | ICD-10-CM | POA: Diagnosis not present

## 2022-08-19 DIAGNOSIS — Z8673 Personal history of transient ischemic attack (TIA), and cerebral infarction without residual deficits: Secondary | ICD-10-CM | POA: Diagnosis not present

## 2022-08-19 DIAGNOSIS — I2510 Atherosclerotic heart disease of native coronary artery without angina pectoris: Secondary | ICD-10-CM | POA: Diagnosis not present

## 2022-08-19 DIAGNOSIS — D696 Thrombocytopenia, unspecified: Secondary | ICD-10-CM | POA: Diagnosis not present

## 2022-08-19 DIAGNOSIS — I110 Hypertensive heart disease with heart failure: Secondary | ICD-10-CM | POA: Diagnosis not present

## 2022-08-19 DIAGNOSIS — I2489 Other forms of acute ischemic heart disease: Secondary | ICD-10-CM | POA: Diagnosis not present

## 2022-08-19 DIAGNOSIS — I252 Old myocardial infarction: Secondary | ICD-10-CM | POA: Diagnosis not present

## 2022-08-21 DIAGNOSIS — I5021 Acute systolic (congestive) heart failure: Secondary | ICD-10-CM | POA: Diagnosis not present

## 2022-08-21 DIAGNOSIS — J441 Chronic obstructive pulmonary disease with (acute) exacerbation: Secondary | ICD-10-CM | POA: Diagnosis not present

## 2022-08-21 DIAGNOSIS — Z87891 Personal history of nicotine dependence: Secondary | ICD-10-CM | POA: Diagnosis not present

## 2022-08-21 DIAGNOSIS — D649 Anemia, unspecified: Secondary | ICD-10-CM | POA: Diagnosis not present

## 2022-08-21 DIAGNOSIS — K219 Gastro-esophageal reflux disease without esophagitis: Secondary | ICD-10-CM | POA: Diagnosis not present

## 2022-08-21 DIAGNOSIS — J440 Chronic obstructive pulmonary disease with acute lower respiratory infection: Secondary | ICD-10-CM | POA: Diagnosis not present

## 2022-08-21 DIAGNOSIS — I2489 Other forms of acute ischemic heart disease: Secondary | ICD-10-CM | POA: Diagnosis not present

## 2022-08-21 DIAGNOSIS — E46 Unspecified protein-calorie malnutrition: Secondary | ICD-10-CM | POA: Diagnosis not present

## 2022-08-21 DIAGNOSIS — Z8673 Personal history of transient ischemic attack (TIA), and cerebral infarction without residual deficits: Secondary | ICD-10-CM | POA: Diagnosis not present

## 2022-08-21 DIAGNOSIS — N1831 Chronic kidney disease, stage 3a: Secondary | ICD-10-CM | POA: Diagnosis not present

## 2022-08-21 DIAGNOSIS — I2510 Atherosclerotic heart disease of native coronary artery without angina pectoris: Secondary | ICD-10-CM | POA: Diagnosis not present

## 2022-08-21 DIAGNOSIS — H54413A Blindness right eye category 3, normal vision left eye: Secondary | ICD-10-CM | POA: Diagnosis not present

## 2022-08-21 DIAGNOSIS — I252 Old myocardial infarction: Secondary | ICD-10-CM | POA: Diagnosis not present

## 2022-08-21 DIAGNOSIS — Z955 Presence of coronary angioplasty implant and graft: Secondary | ICD-10-CM | POA: Diagnosis not present

## 2022-08-21 DIAGNOSIS — I110 Hypertensive heart disease with heart failure: Secondary | ICD-10-CM | POA: Diagnosis not present

## 2022-08-21 DIAGNOSIS — Z9981 Dependence on supplemental oxygen: Secondary | ICD-10-CM | POA: Diagnosis not present

## 2022-08-21 DIAGNOSIS — D696 Thrombocytopenia, unspecified: Secondary | ICD-10-CM | POA: Diagnosis not present

## 2022-08-21 DIAGNOSIS — R1310 Dysphagia, unspecified: Secondary | ICD-10-CM | POA: Diagnosis not present

## 2022-08-21 DIAGNOSIS — F32A Depression, unspecified: Secondary | ICD-10-CM | POA: Diagnosis not present

## 2022-08-24 DIAGNOSIS — I2510 Atherosclerotic heart disease of native coronary artery without angina pectoris: Secondary | ICD-10-CM | POA: Diagnosis not present

## 2022-08-24 DIAGNOSIS — Z87891 Personal history of nicotine dependence: Secondary | ICD-10-CM | POA: Diagnosis not present

## 2022-08-24 DIAGNOSIS — Z9981 Dependence on supplemental oxygen: Secondary | ICD-10-CM | POA: Diagnosis not present

## 2022-08-24 DIAGNOSIS — J441 Chronic obstructive pulmonary disease with (acute) exacerbation: Secondary | ICD-10-CM | POA: Diagnosis not present

## 2022-08-24 DIAGNOSIS — E46 Unspecified protein-calorie malnutrition: Secondary | ICD-10-CM | POA: Diagnosis not present

## 2022-08-24 DIAGNOSIS — R1310 Dysphagia, unspecified: Secondary | ICD-10-CM | POA: Diagnosis not present

## 2022-08-24 DIAGNOSIS — Z8673 Personal history of transient ischemic attack (TIA), and cerebral infarction without residual deficits: Secondary | ICD-10-CM | POA: Diagnosis not present

## 2022-08-24 DIAGNOSIS — D649 Anemia, unspecified: Secondary | ICD-10-CM | POA: Diagnosis not present

## 2022-08-24 DIAGNOSIS — I5021 Acute systolic (congestive) heart failure: Secondary | ICD-10-CM | POA: Diagnosis not present

## 2022-08-24 DIAGNOSIS — H54413A Blindness right eye category 3, normal vision left eye: Secondary | ICD-10-CM | POA: Diagnosis not present

## 2022-08-24 DIAGNOSIS — Z955 Presence of coronary angioplasty implant and graft: Secondary | ICD-10-CM | POA: Diagnosis not present

## 2022-08-24 DIAGNOSIS — N1831 Chronic kidney disease, stage 3a: Secondary | ICD-10-CM | POA: Diagnosis not present

## 2022-08-24 DIAGNOSIS — F32A Depression, unspecified: Secondary | ICD-10-CM | POA: Diagnosis not present

## 2022-08-24 DIAGNOSIS — I252 Old myocardial infarction: Secondary | ICD-10-CM | POA: Diagnosis not present

## 2022-08-24 DIAGNOSIS — J440 Chronic obstructive pulmonary disease with acute lower respiratory infection: Secondary | ICD-10-CM | POA: Diagnosis not present

## 2022-08-24 DIAGNOSIS — D696 Thrombocytopenia, unspecified: Secondary | ICD-10-CM | POA: Diagnosis not present

## 2022-08-24 DIAGNOSIS — I110 Hypertensive heart disease with heart failure: Secondary | ICD-10-CM | POA: Diagnosis not present

## 2022-08-24 DIAGNOSIS — I2489 Other forms of acute ischemic heart disease: Secondary | ICD-10-CM | POA: Diagnosis not present

## 2022-08-24 DIAGNOSIS — K219 Gastro-esophageal reflux disease without esophagitis: Secondary | ICD-10-CM | POA: Diagnosis not present

## 2022-08-27 DIAGNOSIS — I10 Essential (primary) hypertension: Secondary | ICD-10-CM | POA: Diagnosis not present

## 2022-08-27 DIAGNOSIS — J449 Chronic obstructive pulmonary disease, unspecified: Secondary | ICD-10-CM | POA: Diagnosis not present

## 2022-09-01 DIAGNOSIS — I110 Hypertensive heart disease with heart failure: Secondary | ICD-10-CM | POA: Diagnosis not present

## 2022-09-01 DIAGNOSIS — F32A Depression, unspecified: Secondary | ICD-10-CM | POA: Diagnosis not present

## 2022-09-01 DIAGNOSIS — K219 Gastro-esophageal reflux disease without esophagitis: Secondary | ICD-10-CM | POA: Diagnosis not present

## 2022-09-01 DIAGNOSIS — Z87891 Personal history of nicotine dependence: Secondary | ICD-10-CM | POA: Diagnosis not present

## 2022-09-01 DIAGNOSIS — N1831 Chronic kidney disease, stage 3a: Secondary | ICD-10-CM | POA: Diagnosis not present

## 2022-09-01 DIAGNOSIS — I2489 Other forms of acute ischemic heart disease: Secondary | ICD-10-CM | POA: Diagnosis not present

## 2022-09-01 DIAGNOSIS — H54413A Blindness right eye category 3, normal vision left eye: Secondary | ICD-10-CM | POA: Diagnosis not present

## 2022-09-01 DIAGNOSIS — Z8673 Personal history of transient ischemic attack (TIA), and cerebral infarction without residual deficits: Secondary | ICD-10-CM | POA: Diagnosis not present

## 2022-09-01 DIAGNOSIS — J441 Chronic obstructive pulmonary disease with (acute) exacerbation: Secondary | ICD-10-CM | POA: Diagnosis not present

## 2022-09-01 DIAGNOSIS — Z9981 Dependence on supplemental oxygen: Secondary | ICD-10-CM | POA: Diagnosis not present

## 2022-09-01 DIAGNOSIS — J440 Chronic obstructive pulmonary disease with acute lower respiratory infection: Secondary | ICD-10-CM | POA: Diagnosis not present

## 2022-09-01 DIAGNOSIS — I252 Old myocardial infarction: Secondary | ICD-10-CM | POA: Diagnosis not present

## 2022-09-01 DIAGNOSIS — R1310 Dysphagia, unspecified: Secondary | ICD-10-CM | POA: Diagnosis not present

## 2022-09-01 DIAGNOSIS — I2510 Atherosclerotic heart disease of native coronary artery without angina pectoris: Secondary | ICD-10-CM | POA: Diagnosis not present

## 2022-09-01 DIAGNOSIS — D696 Thrombocytopenia, unspecified: Secondary | ICD-10-CM | POA: Diagnosis not present

## 2022-09-01 DIAGNOSIS — D649 Anemia, unspecified: Secondary | ICD-10-CM | POA: Diagnosis not present

## 2022-09-01 DIAGNOSIS — Z955 Presence of coronary angioplasty implant and graft: Secondary | ICD-10-CM | POA: Diagnosis not present

## 2022-09-01 DIAGNOSIS — E46 Unspecified protein-calorie malnutrition: Secondary | ICD-10-CM | POA: Diagnosis not present

## 2022-09-01 DIAGNOSIS — I5021 Acute systolic (congestive) heart failure: Secondary | ICD-10-CM | POA: Diagnosis not present

## 2022-09-28 DIAGNOSIS — J309 Allergic rhinitis, unspecified: Secondary | ICD-10-CM | POA: Diagnosis not present

## 2022-09-28 DIAGNOSIS — J449 Chronic obstructive pulmonary disease, unspecified: Secondary | ICD-10-CM | POA: Diagnosis not present

## 2022-09-28 DIAGNOSIS — I2510 Atherosclerotic heart disease of native coronary artery without angina pectoris: Secondary | ICD-10-CM | POA: Diagnosis not present

## 2022-09-28 DIAGNOSIS — K589 Irritable bowel syndrome without diarrhea: Secondary | ICD-10-CM | POA: Diagnosis not present

## 2022-09-28 DIAGNOSIS — Z23 Encounter for immunization: Secondary | ICD-10-CM | POA: Diagnosis not present

## 2022-09-28 DIAGNOSIS — Z0001 Encounter for general adult medical examination with abnormal findings: Secondary | ICD-10-CM | POA: Diagnosis not present

## 2022-09-28 DIAGNOSIS — M1990 Unspecified osteoarthritis, unspecified site: Secondary | ICD-10-CM | POA: Diagnosis not present

## 2022-09-28 DIAGNOSIS — Z1389 Encounter for screening for other disorder: Secondary | ICD-10-CM | POA: Diagnosis not present

## 2022-09-28 DIAGNOSIS — I10 Essential (primary) hypertension: Secondary | ICD-10-CM | POA: Diagnosis not present

## 2022-09-28 DIAGNOSIS — J9610 Chronic respiratory failure, unspecified whether with hypoxia or hypercapnia: Secondary | ICD-10-CM | POA: Diagnosis not present

## 2022-09-28 DIAGNOSIS — Z9981 Dependence on supplemental oxygen: Secondary | ICD-10-CM | POA: Diagnosis not present

## 2022-10-01 ENCOUNTER — Encounter: Payer: Self-pay | Admitting: Internal Medicine

## 2022-10-01 ENCOUNTER — Ambulatory Visit: Payer: Medicare Other | Admitting: Internal Medicine

## 2022-10-01 ENCOUNTER — Ambulatory Visit: Payer: Medicare Other | Attending: Physician Assistant | Admitting: Nurse Practitioner

## 2022-10-01 VITALS — BP 128/74 | HR 76 | Ht 60.0 in | Wt 108.6 lb

## 2022-10-01 DIAGNOSIS — J9611 Chronic respiratory failure with hypoxia: Secondary | ICD-10-CM | POA: Insufficient documentation

## 2022-10-01 DIAGNOSIS — J9612 Chronic respiratory failure with hypercapnia: Secondary | ICD-10-CM | POA: Diagnosis not present

## 2022-10-01 DIAGNOSIS — J449 Chronic obstructive pulmonary disease, unspecified: Secondary | ICD-10-CM | POA: Insufficient documentation

## 2022-10-01 MED ORDER — STIOLTO RESPIMAT 2.5-2.5 MCG/ACT IN AERS: 2.0000 | INHALATION_SPRAY | Freq: Every day | RESPIRATORY_TRACT | 0 refills | Status: DC

## 2022-10-01 MED ORDER — STIOLTO RESPIMAT 2.5-2.5 MCG/ACT IN AERS: 2.0000 | INHALATION_SPRAY | Freq: Every day | RESPIRATORY_TRACT | 11 refills | Status: DC

## 2022-10-01 NOTE — Progress Notes (Deleted)
Office Visit    Patient Name: [name] Date of Encounter: 10/01/2022  Primary Care Provider:  Carrolyn Meiers, MD Primary Cardiologist:  Donato Heinz, MD  Chief Complaint    72 year old female with a history of CAD s/p PTCA-RCA in 1997, NSTEMI in 2004 with CTO-RCA (managed medically), chronic systolic heart failure, sinus tachycardia, hypertension, hyperlipidemia,  CKD stage IIIa, remote CVA, former tobacco use, severe COPD, anxiety, and depression who presents for follow-up related to CAD and heart failure.  Past Medical History    Past Medical History:  Diagnosis Date   Anxiety    Blind    right   Colitis 09/07/2011   CVA (cerebral infarction)    GERD (gastroesophageal reflux disease)    Hyperlipidemia    Hypertension    Myocardial infarct ([place])    1997, 2004   Severe major depression with psychotic features ([place])    2004   Stroke [facility]) 2002   Past Surgical History:  Procedure Laterality Date   ABDOMINAL HYSTERECTOMY     BILATERAL SALPINGOOPHORECTOMY     BIOPSY  05/08/2022   Procedure: BIOPSY;  Surgeon: Harvel Quale, MD;  [facility];  Service: Gastroenterology;;   CHOLECYSTECTOMY N/A 10/15/2021   Procedure: LAPAROSCOPIC CHOLECYSTECTOMY WITH INTRAOPERATIVE CHOLANGIOGRAM;  Surgeon: Aviva Signs, MD;  Location: [facility];  Service: General;  Laterality: N/A;   COLONOSCOPY N/A 07/17/2014   RMR: Extremely redundant colon. Colonic diverticulosis. Inadequate prepartation precluded examination of all the rectal and colonic.  I suspect slow colonic transit in the setting of a markely redundant colon.   CORONARY ANGIOPLASTY WITH STENT PLACEMENT  [id number]   cyst removed from left wrist     ERCP N/A 10/10/2021   Procedure: ENDOSCOPIC RETROGRADE CHOLANGIOPANCREATOGRAPHY (ERCP);  Surgeon: Rogene Houston, MD;  Location: [facility];  Service: Gastroenterology;  Laterality: N/A;   ERCP N/A 10/13/2021   Procedure: ENDOSCOPIC RETROGRADE  CHOLANGIOPANCREATOGRAPHY (ERCP);  Surgeon: Rogene Houston, MD;  Location: [facility];  Service: Gastroenterology;  Laterality: N/A;   ESOPHAGEAL DILATION  05/08/2022   Procedure: ESOPHAGEAL DILATION;  Surgeon: Montez Morita, Quillian Quince, MD;  [facility];  Service: Gastroenterology;;  savory dilation'   ESOPHAGOGASTRODUODENOSCOPY  09/05/2004   Dr Chucky May gastritis, candida esophagitis   ESOPHAGOGASTRODUODENOSCOPY  09/07/2011   [id number], esophageal in the distal esophagus/Mild gastritis   ESOPHAGOGASTRODUODENOSCOPY (EGD) WITH PROPOFOL N/A 05/08/2022   Procedure: ESOPHAGOGASTRODUODENOSCOPY (EGD) WITH PROPOFOL;  Surgeon: Harvel Quale, MD;  [facility];  Service: Gastroenterology;  Laterality: N/A;  with possible esophageal dilation   Lumps removed from each breast     SPHINCTEROTOMY N/A 10/10/2021   Procedure: SPHINCTEROTOMY INCOMPLETE, STONE NOT REMOVED;  Surgeon: Rogene Houston, MD;  Location: [facility];  Service: Gastroenterology;  Laterality: N/A;    Allergies  Allergies  Allergen Reactions   Aspirin Swelling and Other (See Comments)    Only in large doses will cause a reaction   Bee Venom Swelling    Severe swelling   Contrast Media [Iodinated Contrast Media] Swelling   Lisinopril Swelling   Motrin [Ibuprofen] Swelling   Penicillins Anaphylaxis   Dilaudid [Hydromorphone Hcl] Itching     Labs/Other Studies Reviewed    The following studies were reviewed today: Limited Echo: 05/31/2022 IMPRESSIONS     1. Left ventricular ejection fraction, by estimation, is 35 to 40%. The  left ventricle has moderately decreased function. The left ventricle  demonstrates regional wall motion abnormalities (see scoring  diagram/findings for description).  The mid septal  and all apical LV segments are akinetic (consistent with prior TTE).   2. Right ventricular systolic function is mildly reduced. The right  ventricular size is normal.   3. The mitral  valve is degenerative. Moderate mitral annular  calcification.   Comparison(s): Compared to prior TTE on 05/29/22, there is no significant  change.   Recent Labs: 05/29/2022: TSH 0.554 06/09/2022: B Natriuretic Peptide 1,803.0 06/14/2022: ALT 43; Hemoglobin 9.3; Platelets 89 06/15/2022: Magnesium 2.1 06/16/2022: BUN 5; Creatinine, Ser 0.71; Potassium 3.7; Sodium 140  Recent Lipid Panel    Component Value Date/Time   CHOL 143 05/30/2022 0525   TRIG 26 05/30/2022 0525   HDL 66 05/30/2022 0525   CHOLHDL 2.2 05/30/2022 0525   VLDL 5 05/30/2022 0525   LDLCALC 72 05/30/2022 0525    History of Present Illness    72 year old female with the above past medical history including CAD s/p PTCA-RCA in 1997, NSTEMI in 2004 with CTO-RCA (managed medically), chronic systolic heart failure, sinus tachycardia, hypertension, hyperlipidemia, CKD stage IIIa, remote CVA, former tobacco use, severe COPD, anxiety, and depression.  She was hospitalized in 05/2022 in the setting of shortness of breath, she initially was placed on BiPAP and ultimately required intubation.  Her hospital course was complicated by new onset systolic heart failure, acute metabolic encephalopathy, and possible seizures.  Neurology was consulted.  EEG showed no sign of seizure activity.  Echocardiogram showed EF 35 to 40%, multiple wall motion abnormalities.  Troponin was elevated with flat trend.  Cardiology was consulted.  Initial plan was for possible cardiac catheterization, however, given worsening altered mental status, cath was ultimately deferred..  Limited echo on 05/31/2022 showed EF 35 to 40%, mid septal/apical LV segments akinetic.  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  CT of the head showed age-indeterminate infratentorial/bilateral supratentorial cortical hypodensities worrisome for age-indeterminate infarcts.  MRI of the brain showed no evidence of acute/subacute infarct.  MRI neck showed no  significant stenoses.  She was treated for multifocal pneumonia and COPD exacerbation.  Troponin was elevated, thought to be due to demand ischemia.  There was mention of repeat echo, possible outpatient catheterization once patient stabilized.  She was discharged to [facility] on 06/17/2022.  She presents today for follow-up.  Since her hospitalization  CAD: Chronic systolic heart failure: Sinus tachycardia: Hypertension: Hyperlipidemia: CKD stage IIIa: History of CVA: COPD: Disposition:  Home Medications    Current Outpatient Medications  Medication Sig Dispense Refill   acetaminophen (TYLENOL) 325 MG tablet Take 2 tablets (650 mg total) by mouth every 6 (six) hours as needed for mild pain or headache (or Fever >/= 101).     ascorbic acid (VITAMIN C) 500 MG tablet Take 1 tablet (500 mg total) by mouth daily. 30 tablet 1   aspirin EC 81 MG tablet Take 81 mg by mouth daily.       citalopram (CELEXA) 20 MG tablet Take 20 mg by mouth daily.     clopidogrel (PLAVIX) 75 MG tablet Take 1 tablet (75 mg total) by mouth daily.     dextromethorphan-guaiFENesin (MUCINEX DM) 30-600 MG per 12 hr tablet Take 1 tablet by mouth 2 (two) times daily.     docusate sodium (COLACE) 100 MG capsule Take 100 mg by mouth daily.       folic acid (FOLVITE) 1 MG tablet Take 1 tablet (1 mg total) by mouth daily.     Ipratropium-Albuterol (COMBIVENT) 20-100 MCG/ACT AERS respimat Inhale  1 puff into the lungs every 6 (six) hours as needed for wheezing or shortness of breath. 4 g 2   loratadine (CLARITIN) 10 MG tablet Take 10 mg by mouth daily.     losartan (COZAAR) 25 MG tablet Take 0.5 tablets (12.5 mg total) by mouth daily.     metoprolol tartrate (LOPRESSOR) 50 MG tablet Take 50 mg by mouth 2 (two) times daily.     mirtazapine (REMERON) 7.5 MG tablet Take 7.5 mg by mouth at bedtime.     pantoprazole (PROTONIX) 40 MG tablet Take 1 tablet (40 mg total) by mouth 2 (two) times daily. 60 tablet 1    rosuvastatin (CRESTOR) 20 MG tablet Take 1 tablet (20 mg total) by mouth every evening.     vitamin B-12 (CYANOCOBALAMIN) 500 MCG tablet Take 1 tablet (500 mcg total) by mouth daily.     zinc sulfate 220 (50 Zn) MG capsule Take 1 capsule (220 mg total) by mouth daily. 30 capsule 1   No current facility-administered medications for this visit.     Review of Systems    ***.  All other systems reviewed and are otherwise negative except as noted above.    Physical Exam    VS:  There were no vitals taken for this visit. , BMI There is no height or weight on file to calculate BMI.     GEN: Well nourished, well developed, in no acute distress. HEENT: normal. Neck: Supple, no JVD, carotid bruits, or masses. Cardiac: RRR, no murmurs, rubs, or gallops. No clubbing, cyanosis, edema.  Radials/DP/PT 2+ and equal bilaterally.  Respiratory:  Respirations regular and unlabored, clear to auscultation bilaterally. GI: Soft, nontender, nondistended, BS + x 4. MS: no deformity or atrophy. Skin: warm and dry, no rash. Neuro:  Strength and sensation are intact. Psych: Normal affect.  Accessory Clinical Findings    ECG personally reviewed by me today - *** - no acute changes.   Lab Results  Component Value Date   WBC 8.5 06/14/2022   HGB 9.3 (L) 06/14/2022   HCT 28.5 (L) 06/14/2022   MCV 107.1 (H) 06/14/2022   PLT 89 (L) 06/14/2022   Lab Results  Component Value Date   CREATININE 0.71 06/16/2022   BUN 5 (L) 06/16/2022   NA 140 06/16/2022   K 3.7 06/16/2022   CL 104 06/16/2022   CO2 31 06/16/2022   Lab Results  Component Value Date   ALT 43 06/14/2022   AST 31 06/14/2022   ALKPHOS 51 06/14/2022   BILITOT 1.2 06/14/2022   Lab Results  Component Value Date   CHOL 143 05/30/2022   HDL 66 05/30/2022   LDLCALC 72 05/30/2022   TRIG 26 05/30/2022   CHOLHDL 2.2 05/30/2022    Lab Results  Component Value Date   HGBA1C 5.9 (H) 05/07/2022    Assessment & Plan    1.  ***  No BP  recorded.  {Refresh Note OR Click here to enter BP  :1}***   Lenna Sciara, NP 10/01/2022, 5:18 AM

## 2022-10-01 NOTE — Patient Instructions (Addendum)
Plan A = Automatic = Always=    Stiolto 2 pffs each am   Work on inhaler technique:  relax and gently blow all the way out then take a nice smooth full deep breath back in, triggering the inhaler at same time you start breathing in.  Hold breath in for at least  5 seconds if you can.  . Rinse and gargle with water when done.  If mouth or throat bother you at all,  try brushing teeth/gums/tongue with arm and hammer toothpaste/ make a slurry and gargle and spit out.   Plan B = Backup (to supplement plan A, not to replace it) Only use your albuterol-ipatropium inhaler as a rescue medication to be used if you can't catch your breath by resting or doing a relaxed purse lip breathing pattern.  - The less you use it, the better it will work when you need it. - Ok to use the inhaler up to 2 puffs  every 4 hours if you must but call for appointment if use goes up over your usual need - Don't leave home without it !!  (think of it like starter fluid  for your car)   0xygen 3lpm but ok to increase if needed to keep over 90% walking but remember to turn back down if sats well above 90% at rest.   Please remember to go to the  x-ray department  @  [facility] for your tests - we will call you with the results when they are available      Please schedule a follow up office visit in 6 weeks, call sooner if needed - bring inhalers

## 2022-10-01 NOTE — Progress Notes (Signed)
Lorene Dy, female    DOB: 10-20-1950    MRN: [id number]   Brief patient profile:  54  yobf quit smoking 07/2017 and w/in year got started on inhalers  referred to pulmonary clinic in [facility]  10/01/2022 by Felecia Shelling  for copd/ 02 dep         History of Present Illness  10/01/2022  Pulmonary/ 1st office eval/ [name] / [place] [facility] out of rehab since Jul 04 2022 on combivent prn and 02 prn as well  Chief Complaint  Patient presents with   Consult    Pt consult was admitted 9/8-9/27 for hypoxia, she is currently using 2L O2 PRN - she is having increased SOB.   Dyspnea:  cleaning house s 02  Cough: rattling but min mucoid production  Sleep: flat bed 2 pillows  SABA use: combivent but not using smi correctly  02: 2lpm hs/ sitting 2lpm  most time does not wear it walking   No obvious day to day or daytime pattern/variability or assoc excess/ purulent sputum or mucus plugs or hemoptysis or cp or chest tightness, subjective wheeze or overt sinus or hb symptoms.   Sleeping as above  without nocturnal  or early am exacerbation  of respiratory  c/o's or need for noct saba. Also denies any obvious fluctuation of symptoms with weather or environmental changes or other aggravating or alleviating factors except as outlined above   No unusual exposure hx or h/o childhood pna/ asthma or knowledge of premature birth.  Current Allergies, Complete Past Medical History, Past Surgical History, Family History, and Social History were reviewed in [employer] record.  ROS  The following are not active complaints unless bolded Hoarseness, sore throat, dysphagia, dental problems, itching, sneezing,  nasal congestion or discharge of excess mucus or purulent secretions, ear ache,   fever, chills, sweats, unintended wt loss or wt gain, classically pleuritic or exertional cp,  orthopnea pnd or arm/hand swelling  or leg swelling, presyncope, palpitations, abdominal pain, anorexia,  nausea, vomiting, diarrhea  or change in bowel habits or change in bladder habits, change in stools or change in urine, dysuria, hematuria,  rash, arthralgias, visual complaints, headache, numbness, weakness or ataxia or problems with walking or coordination,  change in mood or  memory.             Past Medical History:  Diagnosis Date   Anxiety    Blind    right   Colitis 09/07/2011   CVA (cerebral infarction)    GERD (gastroesophageal reflux disease)    Hyperlipidemia    Hypertension    Myocardial infarct (HCC)    1997, 2004   Severe major depression with psychotic features (HCC)    2004   Stroke [facility]) 2002    Outpatient Medications Prior to Visit  Medication Sig Dispense Refill   acetaminophen (TYLENOL) 325 MG tablet Take 2 tablets (650 mg total) by mouth every 6 (six) hours as needed for mild pain or headache (or Fever >/= 101).     ascorbic acid (VITAMIN C) 500 MG tablet Take 1 tablet (500 mg total) by mouth daily. 30 tablet 1   aspirin EC 81 MG tablet Take 81 mg by mouth daily.       citalopram (CELEXA) 20 MG tablet Take 20 mg by mouth daily.     clopidogrel (PLAVIX) 75 MG tablet Take 1 tablet (75 mg total) by mouth daily.     dextromethorphan-guaiFENesin (MUCINEX DM) 30-600 MG per 12 hr  tablet Take 1 tablet by mouth 2 (two) times daily.     docusate sodium (COLACE) 100 MG capsule Take 100 mg by mouth daily.       folic acid (FOLVITE) 1 MG tablet Take 1 tablet (1 mg total) by mouth daily.     Ipratropium-Albuterol (COMBIVENT) 20-100 MCG/ACT AERS respimat Inhale 1 puff into the lungs every 6 (six) hours as needed for wheezing or shortness of breath. 4 g 2   loratadine (CLARITIN) 10 MG tablet Take 10 mg by mouth daily.     losartan (COZAAR) 25 MG tablet Take 0.5 tablets (12.5 mg total) by mouth daily.     metoprolol tartrate (LOPRESSOR) 50 MG tablet Take 50 mg by mouth 2 (two) times daily.     mirtazapine (REMERON) 7.5 MG tablet Take 7.5 mg by mouth at bedtime.      pantoprazole (PROTONIX) 40 MG tablet Take 1 tablet (40 mg total) by mouth 2 (two) times daily. 60 tablet 1   rosuvastatin (CRESTOR) 20 MG tablet Take 1 tablet (20 mg total) by mouth every evening.     vitamin B-12 (CYANOCOBALAMIN) 500 MCG tablet Take 1 tablet (500 mcg total) by mouth daily.     zinc sulfate 220 (50 Zn) MG capsule Take 1 capsule (220 mg total) by mouth daily. 30 capsule 1   No facility-administered medications prior to visit.     Objective:     BP 128/74   Pulse 76   Ht 5' (1.524 m)   Wt 108 lb 9.6 oz (49.3 kg)   SpO2 98% Comment: 2L  BMI 21.21 kg/m   SpO2: 98 % (2L)  Elderly stoic bf nad at rest  HEENT :  Oropharynx  clear/partial upper/ lower dentures  Nasal turbinates nl    NECK :  without JVD/Nodes/TM/ nl carotid upstrokes bilaterally   LUNGS: no acc muscle use,  Mod barrel  contour chest wall with bilateral  Distant bs s audible wheeze and  without cough on insp or exp maneuvers and mod  Hyperresonant  to  percussion bilaterally     CV:  RRR  no s3 or murmur or increase in P2, and no edema   ABD:  soft and nontender with pos mid insp Hoover's  in the supine position. No bruits or organomegaly appreciated, bowel sounds nl  MS:   Ext warm without deformities or   obvious joint restrictions , calf tenderness, cyanosis or clubbing  SKIN: warm and dry without lesions    NEURO:  alert, approp, nl sensorium with  no motor or cerebellar deficits apparent.              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  Cxr 10/01/2022 did not go as requested   Assessment   COPD GOLD ? Quit smoking 07/2017 - 02 dep s/p admit 05/2022 - 10/01/2022  After extensive coaching inhaler device,  effectiveness =    60% smi > try stiolto   Pt is Group B in terms of symptom/risk and  laba/lama therefore appropriate rx at this point >>>  stiolto and approp saba:  Re SABA :  I spent extra time with pt today reviewing appropriate use of albuterol for prn use on exertion with the following points: 1) saba is for relief of sob that does not improve by walking a slower pace or resting  but rather if the pt does not improve after trying this first. 2) If the pt is convinced, as many are, that saba helps recover from activity faster then it's easy to tell if this is the case by re-challenging : ie stop, take the inhaler, then p 5 minutes try the exact same activity (intensity of workload) that just caused the symptoms and see if they are substantially diminished or not after saba 3) if there is an activity that reproducibly causes the symptoms, try the saba 15 min before the activity on alternate days   If in fact the saba really does help, then fine to continue to use it prn but advised may need to look closer at the maintenance regimen being used to achieve better control of airways disease with exertion.    Advised needs to return for cxr to f/u effusion; Note that pleural effusion and copd have the same effect on insp muscles/mechanics (both shorten their length prior to inspiration making them weaker with less force reserve) so they are synergistic in causing sob so a small effusion can go a long way to worsening copd symptoms anddef needs f/u here.      Chronic respiratory failure with hypoxia and hypercapnia (HCC) D/c on 2lpm with HC03  = 31  06/16/22  - 10/01/2022 Patient Saturations on Room Air at Rest = 88%  while Ambulating = 86% / on 3 Liters of oxygen while Ambulating = 94%  Rec  3lpm 24/7 x ok to titrate up with ambulation with target > 90%   Each maintenance medication was reviewed in detail including emphasizing most importantly the difference between maintenance and prns and under what circumstances the prns are to be triggered using an action plan format where  appropriate.  Total time for H and P, chart review, counseling, reviewing smi device(s) , directly observing portions of ambulatory 02 saturation study/ and generating customized AVS unique to this office visit / same day charting = 50 min with complex pt new to me                  Christinia Gully, MD 10/01/2022

## 2022-10-01 NOTE — Assessment & Plan Note (Addendum)
D/c on 2lpm with HC03  = 31  06/16/22  - 10/01/2022 Patient Saturations on Room Air at Rest = 88%  while Ambulating = 86% / on 3 Liters of oxygen while Ambulating = 94%  Rec  3lpm 24/7 x ok to titrate up with ambulation with target > 90%   Each maintenance medication was reviewed in detail including emphasizing most importantly the difference between maintenance and prns and under what circumstances the prns are to be triggered using an action plan format where appropriate.  Total time for H and P, chart review, counseling, reviewing smi device(s) , directly observing portions of ambulatory 02 saturation study/ and generating customized AVS unique to this office visit / same day charting = 50 min with complex pt new to me

## 2022-10-01 NOTE — Assessment & Plan Note (Addendum)
Quit smoking 07/2017 - 02 dep s/p admit 05/2022 - 10/01/2022  After extensive coaching inhaler device,  effectiveness =    60% smi > try stiolto   Pt is Group B in terms of symptom/risk and laba/lama therefore appropriate rx at this point >>>  stiolto and approp saba:  Re SABA :  I spent extra time with pt today reviewing appropriate use of albuterol for prn use on exertion with the following points: 1) saba is for relief of sob that does not improve by walking a slower pace or resting but rather if the pt does not improve after trying this first. 2) If the pt is convinced, as many are, that saba helps recover from activity faster then it's easy to tell if this is the case by re-challenging : ie stop, take the inhaler, then p 5 minutes try the exact same activity (intensity of workload) that just caused the symptoms and see if they are substantially diminished or not after saba 3) if there is an activity that reproducibly causes the symptoms, try the saba 15 min before the activity on alternate days   If in fact the saba really does help, then fine to continue to use it prn but advised may need to look closer at the maintenance regimen being used to achieve better control of airways disease with exertion.    Advised needs to return for cxr to f/u effusion; Note that pleural effusion and copd have the same effect on insp muscles/mechanics (both shorten their length prior to inspiration making them weaker with less force reserve) so they are synergistic in causing sob so a small effusion can go a long way to worsening copd symptoms anddef needs f/u here.

## 2022-10-02 ENCOUNTER — Telehealth: Payer: Self-pay | Admitting: Internal Medicine

## 2022-10-02 DIAGNOSIS — J449 Chronic obstructive pulmonary disease, unspecified: Secondary | ICD-10-CM | POA: Diagnosis not present

## 2022-10-02 NOTE — Telephone Encounter (Signed)
Pt did not go to cxr, please have her return as soon as she can to complete the w/u

## 2022-10-05 NOTE — Telephone Encounter (Signed)
Called and spoke to son  and he states that they are planning for pt to get CXR by the end of this week.

## 2022-10-29 DIAGNOSIS — I10 Essential (primary) hypertension: Secondary | ICD-10-CM | POA: Diagnosis not present

## 2022-10-29 DIAGNOSIS — J449 Chronic obstructive pulmonary disease, unspecified: Secondary | ICD-10-CM | POA: Diagnosis not present

## 2022-11-02 DIAGNOSIS — J449 Chronic obstructive pulmonary disease, unspecified: Secondary | ICD-10-CM | POA: Diagnosis not present

## 2022-11-15 NOTE — Progress Notes (Unsigned)
[name], female    DOB: Oct 05, 1950    MRN: [id number]   Brief patient profile:  52  yobf quit smoking 07/2017 and w/in year got started on inhalers  referred to pulmonary clinic in [facility]  10/01/2022 by Legrand Rams  for copd/ 02 dep   History of Present Illness  10/01/2022  Pulmonary/ 1st office eval/ [name] / [name] on combivent prn and 02 prn as well  Chief Complaint  Patient presents with   Consult    Pt consult was admitted 9/8-9/27 for hypoxia, she is currently using 2L O2 PRN - she is having increased SOB.   Dyspnea:  cleaning house s 02  Cough: rattling but min mucoid production  Sleep: flat bed 2 pillows  SABA use: combivent but not using smi correctly  02: 2lpm hs/ sitting 2lpm  most time does not wear it walking  Rec Plan A = Automatic = Always=    Stiolto 2 pffs each am  Work on inhaler technique:  Plan B = Backup (to supplement plan A, not to replace it) Only use your albuterol-ipatropium inhaler as a rescue medication 0xygen 3lpm but ok to increase if needed to keep over 90% walking but remember to turn back down if sats well above 90% at rest.   11/16/2022  f/u ov/[place] office/Katreena Schupp re: copd/Emphysema  maint on 3lpm   needs hfa teaching on stiolto  Chief Complaint  Patient presents with   Follow-up    Breathing is about the same since last ov  Dyspnea:  very inactive, just goes out to doctor  Cough: none  Sleeping: flat bed 2 pillows s resp cc  SABA use: none  02: 3lpm 24/7  Covid status: vax x 2  Lung cancer screening: not needed until 9/204   No obvious day to day or daytime variability or assoc excess/ purulent sputum or mucus plugs or hemoptysis or cp or chest tightness, subjective wheeze or overt sinus or hb symptoms.   Sleeping  without nocturnal  or early am exacerbation  of respiratory  c/o's or need for noct saba. Also denies any obvious fluctuation of symptoms with weather or environmental changes or  other aggravating or alleviating factors except as outlined above   No unusual exposure hx or h/o childhood pna/ asthma or knowledge of premature birth.  Current Allergies, Complete Past Medical History, Past Surgical History, Family History, and Social History were reviewed in [employer] record.  ROS  The following are not active complaints unless bolded Hoarseness, sore throat, dysphagia, dental problems, itching, sneezing,  nasal congestion or discharge of excess mucus or purulent secretions, ear ache,   fever, chills, sweats, unintended wt loss or wt gain, classically pleuritic or exertional cp,  orthopnea pnd or arm/hand swelling  or leg swelling, presyncope, palpitations, abdominal pain, anorexia, nausea, vomiting, diarrhea  or change in bowel habits or change in bladder habits, change in stools or change in urine, dysuria, hematuria,  rash, arthralgias, visual complaints, headache, numbness, weakness or ataxia or problems with walking or coordination,  change in mood or  memory.        Current Meds  Medication Sig   acetaminophen (TYLENOL) 325 MG tablet Take 2 tablets (650 mg total) by mouth every 6 (six) hours as needed for mild pain or headache (or Fever >/= 101).   ascorbic acid (VITAMIN C) 500 MG tablet Take 1 tablet (500 mg total) by mouth daily.  aspirin EC 81 MG tablet Take 81 mg by mouth daily.     citalopram (CELEXA) 20 MG tablet Take 20 mg by mouth daily.   clopidogrel (PLAVIX) 75 MG tablet Take 1 tablet (75 mg total) by mouth daily.   dextromethorphan-guaiFENesin (MUCINEX DM) 30-600 MG per 12 hr tablet Take 1 tablet by mouth 2 (two) times daily.   docusate sodium (COLACE) 100 MG capsule Take 100 mg by mouth daily.     folic acid (FOLVITE) 1 MG tablet Take 1 tablet (1 mg total) by mouth daily.   Ipratropium-Albuterol (COMBIVENT) 20-100 MCG/ACT AERS respimat Inhale 1 puff into the lungs every 6 (six) hours as needed for wheezing or shortness of breath.    loratadine (CLARITIN) 10 MG tablet Take 10 mg by mouth daily.   losartan (COZAAR) 25 MG tablet Take 0.5 tablets (12.5 mg total) by mouth daily.   metoprolol tartrate (LOPRESSOR) 50 MG tablet Take 50 mg by mouth 2 (two) times daily.   mirtazapine (REMERON) 7.5 MG tablet Take 7.5 mg by mouth at bedtime.   pantoprazole (PROTONIX) 40 MG tablet Take 1 tablet (40 mg total) by mouth 2 (two) times daily.   rosuvastatin (CRESTOR) 20 MG tablet Take 1 tablet (20 mg total) by mouth every evening.   Tiotropium Bromide-Olodaterol (STIOLTO RESPIMAT) 2.5-2.5 MCG/ACT AERS Inhale 2 puffs into the lungs daily.   vitamin B-12 (CYANOCOBALAMIN) 500 MCG tablet Take 1 tablet (500 mcg total) by mouth daily.   zinc sulfate 220 (50 Zn) MG capsule Take 1 capsule (220 mg total) by mouth daily.               Past Medical History:  Diagnosis Date   Anxiety    Blind    right   Colitis 09/07/2011   CVA (cerebral infarction)    GERD (gastroesophageal reflux disease)    Hyperlipidemia    Hypertension    Myocardial infarct ([place])    1997, 2004   Severe major depression with psychotic features ([place])    2004   Stroke [facility]) 2002       Objective:     Wt Readings from Last 3 Encounters:  11/16/22 112 lb 12.8 oz (51.2 kg)  10/01/22 108 lb 9.6 oz (49.3 kg)  06/15/22 111 lb 4.3 oz (50.5 kg)      Vital signs reviewed  11/16/2022  - Note at rest 02 sats  96% on 3 cont  and 86% RA  General appearance:    elderly amb bf      HEENT :  Oropharynx  clear/ top denture      NECK :  without JVD/Nodes/TM/ nl carotid upstrokes bilaterally   LUNGS: no acc muscle use,  Mod barrel  contour chest wall with bilateral  Distant bs s audible wheeze and  without cough on insp or exp maneuvers and mod  Hyperresonant  to  percussion bilaterally     CV:  RRR  no s3 or murmur or increase in P2, and no edema   ABD:  soft and nontender with pos mid insp Hoover's  in the supine position. No bruits or organomegaly appreciated,  bowel sounds nl  MS:   Ext warm without deformities or   obvious joint restrictions , calf tenderness, cyanosis or clubbing  SKIN: warm and dry without lesions    NEURO:  alert, approp, nl sensorium with  no motor or cerebellar deficits apparent.        Assessment

## 2022-11-16 ENCOUNTER — Ambulatory Visit: Payer: Medicare Other | Admitting: Internal Medicine

## 2022-11-16 ENCOUNTER — Encounter: Payer: Self-pay | Admitting: Internal Medicine

## 2022-11-16 VITALS — BP 130/68 | HR 76 | Ht 60.0 in | Wt 112.8 lb

## 2022-11-16 DIAGNOSIS — J9612 Chronic respiratory failure with hypercapnia: Secondary | ICD-10-CM

## 2022-11-16 DIAGNOSIS — J9611 Chronic respiratory failure with hypoxia: Secondary | ICD-10-CM | POA: Diagnosis not present

## 2022-11-16 DIAGNOSIS — J449 Chronic obstructive pulmonary disease, unspecified: Secondary | ICD-10-CM | POA: Diagnosis not present

## 2022-11-16 NOTE — Assessment & Plan Note (Signed)
D/c on 2lpm with HC03  = 31  06/16/22  - 10/01/2022 Patient Saturations on Room Air at Rest = 88%  while Ambulating = 86% / on 3 Liters of oxygen while Ambulating = 94%  Advised: Make sure you check your oxygen saturation  AT  your highest level of activity (not after you stop)   to be sure it stays over 90% and adjust  02 flow upward to maintain this level if needed but remember to turn it back to previous settings when you stop (to conserve your supply).   F/u 3 months          Each maintenance medication was reviewed in detail including emphasizing most importantly the difference between maintenance and prns and under what circumstances the prns are to be triggered using an action plan format where appropriate.  Total time for H and P, chart review, counseling, reviewing smi/ 02  device(s) and generating customized AVS unique to this office visit / same day charting = 25 min

## 2022-11-16 NOTE — Patient Instructions (Addendum)
Plan A = Automatic = Always=    Stiolto 2 pffs each am   Work on inhaler technique:  relax and gently blow all the way out then take a nice smooth full deep breath back in, triggering the inhaler at same time you start breathing in.  Hold breath in for at least  5 seconds if you can.  . Rinse and gargle with water when done.  If mouth or throat bother you at all,  try brushing teeth/gums/tongue with arm and hammer toothpaste/ make a slurry and gargle and spit out.   Plan B = Backup (to supplement plan A, not to replace it) Only use your albuterol-ipatropium inhaler as a rescue medication to be used if you can't catch your breath by resting or doing a relaxed purse lip breathing pattern.  - The less you use it, the better it will work when you need it. - Ok to use the inhaler up to 2 puffs  every 4 hours if you must but call for appointment if use goes up over your usual need - Don't leave home without it !!  (think of it like starter fluid  for your car)    My office will be contacting you by phone for referral for best fit evaluation  - if you don't hear back from my office within one week please call us back or notify us thru MyChart and we'll address it right away.   Make sure you check your oxygen saturation  AT  your highest level of activity (not after you stop)   to be sure it stays over 90% and adjust  02 flow upward to maintain this level if needed but remember to turn it back to previous settings when you stop (to conserve your supply).   Please schedule a follow up visit in 3 months but call sooner if needed

## 2022-11-16 NOTE — Assessment & Plan Note (Signed)
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  - 10/01/2022  After extensive coaching inhaler device,  effectiveness =    60% smi (short ti)  > try stiolto   Pt is Group B in terms of symptom/risk and laba/lama therefore appropriate rx at this point >>>  stiolto and approp saba

## 2022-11-27 DIAGNOSIS — J449 Chronic obstructive pulmonary disease, unspecified: Secondary | ICD-10-CM | POA: Diagnosis not present

## 2022-11-27 DIAGNOSIS — I10 Essential (primary) hypertension: Secondary | ICD-10-CM | POA: Diagnosis not present

## 2022-12-01 DIAGNOSIS — J449 Chronic obstructive pulmonary disease, unspecified: Secondary | ICD-10-CM | POA: Diagnosis not present

## 2022-12-28 DIAGNOSIS — J449 Chronic obstructive pulmonary disease, unspecified: Secondary | ICD-10-CM | POA: Diagnosis not present

## 2022-12-28 DIAGNOSIS — I10 Essential (primary) hypertension: Secondary | ICD-10-CM | POA: Diagnosis not present

## 2023-01-01 DIAGNOSIS — J449 Chronic obstructive pulmonary disease, unspecified: Secondary | ICD-10-CM | POA: Diagnosis not present

## 2023-01-27 DIAGNOSIS — J449 Chronic obstructive pulmonary disease, unspecified: Secondary | ICD-10-CM | POA: Diagnosis not present

## 2023-01-27 DIAGNOSIS — I10 Essential (primary) hypertension: Secondary | ICD-10-CM | POA: Diagnosis not present

## 2023-01-31 DIAGNOSIS — J449 Chronic obstructive pulmonary disease, unspecified: Secondary | ICD-10-CM | POA: Diagnosis not present

## 2023-02-21 NOTE — Progress Notes (Signed)
[name], female    DOB: 1951-07-22    MRN: [id number]   Brief patient profile:  57  yobf quit smoking 07/2017 and w/in year got started on inhalers  referred to pulmonary clinic in [facility]  10/01/2022 by Felecia Shelling  for copd/ 02 dep   History of Present Illness  10/01/2022  Pulmonary/ 1st office eval/ [name] / [facility] out of rehab since Jul 04 2022 on combivent prn and 02 prn as well  Chief Complaint  Patient presents with   Consult    Pt consult was admitted 9/8-9/27 for hypoxia, she is currently using 2L O2 PRN - she is having increased SOB.   Dyspnea:  cleaning house s 02  Cough: rattling but min mucoid production  Sleep: flat bed 2 pillows  SABA use: combivent but not using smi correctly  02: 2lpm hs/ sitting 2lpm  most time does not wear it walking  Rec Plan A = Automatic = Always=    Stiolto 2 pffs each am  Work on inhaler technique:  Plan B = Backup (to supplement plan A, not to replace it) Only use your albuterol-ipatropium inhaler as a rescue medication 0xygen 3lpm but ok to increase if needed to keep over 90% walking but remember to turn back down if sats well above 90% at rest.   11/16/2022  f/u ov/[place] [facility]/Arturo Freundlich re: copd/Emphysema  maint on 3lpm   needs hfa teaching on stiolto  Chief Complaint  Patient presents with   Follow-up    Breathing is about the same since last ov  Dyspnea:  very inactive, just goes out to doctor  Cough: none  Sleeping: flat bed 2 pillows s resp cc  SABA use: none  02: 3lpm 24/7  Covid status: vax x 2  Lung cancer screening: not needed until 05/2023  Rec Plan A = Automatic = Always=    Stiolto 2 pffs each am  Work on inhaler technique:  Plan B = Backup (to supplement plan A, not to replace it) Only use your albuterol-ipatropium inhaler as a rescue medication My office will be contacting you by phone for referral for best fit evaluation  - if you don't hear back from my office within one week please call us back or  notify us thru MyChart and we'll address it right away.   Make sure you check your oxygen saturation  AT  your highest level of activity (not after you stop)   to be sure it stays over 90%    02/22/2023  f/u ov/ office/Nalini Alcaraz re: COPD  Gold stage? emphysema  maint on stiolto  but not taking daily  Chief Complaint  Patient presents with   Follow-up  Dyspnea:  extremely sedentary Cough: comes and goes  Sleeping: flat bed / 2 pillows  SABA use: combivent  02: 2.5-  3 lpm 24/7 - not titrating    Lung cancer screening: referred    No obvious day to day or daytime variability or assoc excess/ purulent sputum or mucus plugs or hemoptysis or cp or chest tightness, subjective wheeze or overt sinus or hb symptoms.   Sleeping  without nocturnal  or early am exacerbation  of respiratory  c/o's or need for noct saba. Also denies any obvious fluctuation of symptoms with weather or environmental changes or other aggravating or alleviating factors except as outlined above   No unusual exposure hx or h/o childhood pna/ asthma or knowledge of premature birth.  Current Allergies, Complete Past Medical History, Past  Surgical History, Family History, and Social History were reviewed in [employer] record.  ROS  The following are not active complaints unless bolded Hoarseness, sore throat, dysphagia, dental problems, itching, sneezing,  nasal congestion or discharge of excess mucus or purulent secretions, ear ache,   fever, chills, sweats, unintended wt loss or wt gain, classically pleuritic or exertional cp,  orthopnea pnd or arm/hand swelling  or leg swelling, presyncope, palpitations, abdominal pain, anorexia, nausea, vomiting, diarrhea  or change in bowel habits or change in bladder habits, change in stools or change in urine, dysuria, hematuria,  rash, arthralgias, visual complaints, headache, numbness, weakness or ataxia or problems with walking or coordination,  change in  mood or  memory.        Current Meds  Medication Sig   acetaminophen (TYLENOL) 325 MG tablet Take 2 tablets (650 mg total) by mouth every 6 (six) hours as needed for mild pain or headache (or Fever >/= 101).   ascorbic acid (VITAMIN C) 500 MG tablet Take 1 tablet (500 mg total) by mouth daily.   aspirin EC 81 MG tablet Take 81 mg by mouth daily.     citalopram (CELEXA) 20 MG tablet Take 20 mg by mouth daily.   clopidogrel (PLAVIX) 75 MG tablet Take 1 tablet (75 mg total) by mouth daily.   dextromethorphan-guaiFENesin (MUCINEX DM) 30-600 MG per 12 hr tablet Take 1 tablet by mouth 2 (two) times daily.   docusate sodium (COLACE) 100 MG capsule Take 100 mg by mouth daily.     folic acid (FOLVITE) 1 MG tablet Take 1 tablet (1 mg total) by mouth daily.   Ipratropium-Albuterol (COMBIVENT) 20-100 MCG/ACT AERS respimat Inhale 1 puff into the lungs every 6 (six) hours as needed for wheezing or shortness of breath.   loratadine (CLARITIN) 10 MG tablet Take 10 mg by mouth daily.   losartan (COZAAR) 25 MG tablet Take 0.5 tablets (12.5 mg total) by mouth daily.   metoprolol tartrate (LOPRESSOR) 50 MG tablet Take 50 mg by mouth 2 (two) times daily.   mirtazapine (REMERON) 7.5 MG tablet Take 7.5 mg by mouth at bedtime.   pantoprazole (PROTONIX) 40 MG tablet Take 1 tablet (40 mg total) by mouth 2 (two) times daily.   rosuvastatin (CRESTOR) 20 MG tablet Take 1 tablet (20 mg total) by mouth every evening.   Tiotropium Bromide-Olodaterol (STIOLTO RESPIMAT) 2.5-2.5 MCG/ACT AERS Inhale 2 puffs into the lungs daily.   vitamin B-12 (CYANOCOBALAMIN) 500 MCG tablet Take 1 tablet (500 mcg total) by mouth daily.   zinc sulfate 220 (50 Zn) MG capsule Take 1 capsule (220 mg total) by mouth daily.                Past Medical History:  Diagnosis Date   Anxiety    Blind    right   Colitis 09/07/2011   CVA (cerebral infarction)    GERD (gastroesophageal reflux disease)    Hyperlipidemia    Hypertension     Myocardial infarct (HCC)    1997, 2004   Severe major depression with psychotic features (HCC)    2004   Stroke [facility]) 2002       Objective:    Wts   02/22/2023         134    11/16/22 112 lb 12.8 oz (51.2 kg)  10/01/22 108 lb 9.6 oz (49.3 kg)  06/15/22 111 lb 4.3 oz (50.5 kg)     Vital signs reviewed  02/22/2023  -  Note at rest 02 sats  99% on 3lpm cont    General appearance:    elderly bf > stated age with somber affect and ? Slowed mentation in terms of responses       HEENT :  Oropharynx  clear/ top denture     NECK :  without JVD/Nodes/TM/ nl carotid upstrokes bilaterally   LUNGS: no acc muscle use,  Mod barrel  contour chest wall with bilateral  Distant bs s audible wheeze and  without cough on insp or exp maneuvers and mod  Hyperresonant  to  percussion bilaterally     CV:  RRR  no s3 or murmur or increase in P2, and no edema   ABD:  soft and nontender with pos mid insp Hoover's  in the supine position. No bruits or organomegaly appreciated, bowel sounds nl  MS:   Ext warm without deformities or   obvious joint restrictions , calf tenderness, cyanosis or clubbing  SKIN: warm and dry without lesions    NEURO:  alert, approp, nl sensorium with  no motor or cerebellar deficits apparent.               Assessment

## 2023-02-22 ENCOUNTER — Telehealth: Payer: Self-pay | Admitting: Internal Medicine

## 2023-02-22 ENCOUNTER — Encounter: Payer: Self-pay | Admitting: Internal Medicine

## 2023-02-22 ENCOUNTER — Ambulatory Visit: Payer: Medicare Other | Admitting: Internal Medicine

## 2023-02-22 VITALS — BP 103/55 | HR 91 | Ht 60.0 in | Wt 134.8 lb

## 2023-02-22 DIAGNOSIS — I428 Other cardiomyopathies: Secondary | ICD-10-CM

## 2023-02-22 DIAGNOSIS — J9612 Chronic respiratory failure with hypercapnia: Secondary | ICD-10-CM | POA: Diagnosis not present

## 2023-02-22 DIAGNOSIS — J9611 Chronic respiratory failure with hypoxia: Secondary | ICD-10-CM | POA: Diagnosis not present

## 2023-02-22 DIAGNOSIS — J449 Chronic obstructive pulmonary disease, unspecified: Secondary | ICD-10-CM

## 2023-02-22 DIAGNOSIS — Z87891 Personal history of nicotine dependence: Secondary | ICD-10-CM

## 2023-02-22 MED ORDER — STIOLTO RESPIMAT 2.5-2.5 MCG/ACT IN AERS: 2.0000 | INHALATION_SPRAY | Freq: Every day | RESPIRATORY_TRACT | 0 refills | Status: DC

## 2023-02-22 NOTE — Assessment & Plan Note (Signed)
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  - 10/01/2022  After extensive coaching inhaler device,  effectiveness =    60% smi (short ti)  > try stiolto  - 02/22/2023  After extensive coaching inhaler device,  effectiveness =    75% smi  (short ti) >>> continue q am stiolto   Pt is Group B in terms of symptom/risk and laba/lama therefore appropriate rx at this point >>>  stiolto 2 each am (not prn) plus prn combivent  Re SABA :  I spent extra time with pt today reviewing appropriate use of albuterol for prn use on exertion with the following points: 1) saba is for relief of sob that does not improve by walking a slower pace or resting but rather if the pt does not improve after trying this first. 2) If the pt is convinced, as many are, that saba helps recover from activity faster then it's easy to tell if this is the case by re-challenging : ie stop, take the inhaler, then p 5 minutes try the exact same activity (intensity of workload) that just caused the symptoms and see if they are substantially diminished or not after saba 3) if there is an activity that reproducibly causes the symptoms, try the saba 15 min before the activity on alternate days   If in fact the saba really does help, then fine to continue to use it prn but advised may need to look closer at the maintenance regimen being used to achieve better control of airways disease with exertion.

## 2023-02-22 NOTE — Patient Instructions (Addendum)
Plan A = Automatic = Always=    Stiolto 2 pffs each am   Work on inhaler technique:  relax and gently blow all the way out then take a nice smooth full deep breath back in, triggering the inhaler at same time you start breathing in.  Hold breath in for at least  5 seconds if you can.  Rinse and gargle with water when done.  If mouth or throat bother you at all,  try brushing teeth/gums/tongue with arm and hammer toothpaste/ make a slurry and gargle and spit out.     Plan B = Backup (to supplement plan A, not to replace it) Only use your combivent  as a rescue medication to be used if you can't catch your breath by resting or doing a relaxed purse lip breathing pattern.  - The less you use it, the better it will work when you need it. - Ok to use the inhaler up to 1 puffs  every 4 hours if you must but call for appointment if use goes up over your usual need - Don't leave home without it !!  (think of it like the spare tire for your car)    Make sure you check your oxygen saturation  AT  your highest level of activity (not after you stop)   to be sure it stays over 90% and adjust  02 flow upward to maintain this level if needed but remember to turn it back to previous settings when you stop (to conserve your supply).     My office will be contacting you by phone for referral to lung cancer screening program   - if you don't hear back from my office within one week please call us back or notify us thru MyChart and we'll address it right away.   Please schedule a follow up visit in 3 months but call sooner if needed - PFTs on return  Add: referred to cards for exp cp/ sys chf by last echo

## 2023-02-22 NOTE — Telephone Encounter (Signed)
Record indicates she was supposed to see Cardiology p last admit but never apparently established as out pt and developed cp with walking 02/22/2023 so needs referral to [facility] or [facility], whichever is closest [facility] cardiology

## 2023-02-22 NOTE — Assessment & Plan Note (Signed)
Echo 05/31/22 1. Left ventricular ejection fraction, by estimation, is 35 to 40%. The  left ventricle has moderately decreased function. The left ventricle  demonstrates regional wall motion abnormalities (see scoring  diagram/findings for description). The mid septal  and all apical LV segments are akinetic (consistent with prior TTE).   2. Right ventricular systolic function is mildly reduced. The right  ventricular size is normal.   3. The mitral valve is degenerative. Moderate mitral annular  calcification.  Comparison(s): Compared to prior TTE on 05/29/22, there is no significant  change.   - 02/22/2023 walking 300 ft slow in office with adequate sats on 2.5 lpm develped cp/ resolved at rest, not previously reported > refer to cards   Each maintenance medication was reviewed in detail including emphasizing most importantly the difference between maintenance and prns and under what circumstances the prns are to be triggered using an action plan format where appropriate.  Total time for H and P, chart review, counseling, reviewing smi/02 device(s) , directly observing portions of ambulatory 02 saturation study/ and generating customized AVS unique to this office visit / same day charting  > 30 min for multiple refractory respiratory/chest  symptoms of uncertain etiology

## 2023-02-22 NOTE — Assessment & Plan Note (Signed)
D/c on 2lpm with HC03  = 31  06/16/22  - 10/01/2022 Patient Saturations on Room Air at Rest = 88%  while Ambulating = 86% / on 3 Liters of oxygen while Ambulating = 94% - 02/22/2023   Walked on 2.5 cont lpm  x  2  lap(s) =  approx 300  ft  @ slow pace, stopped due to cp/sob  with lowest 02 sats 95% (see chf)   Advised: Make sure you check your oxygen saturation  AT  your highest level of activity (not after you stop)   to be sure it stays over 90% and adjust  02 flow upward to maintain this level if needed but remember to turn it back to previous settings when you stop (to conserve your supply).

## 2023-02-22 NOTE — Assessment & Plan Note (Signed)
Quit smoking  2018  - referred for LCS  02/22/2023   Low-dose CT lung cancer screening is recommended for patients who are 61-73 years of age with a 20+ pack-year history of smoking and who are currently smoking or quit <=15 years ago. No coughing up blood  No unintentional weight loss of > 15 pounds in the last 6 months - pt is eligible for scanning yearly until age 5 > referred

## 2023-02-23 NOTE — Telephone Encounter (Signed)
ATC pt LVM letting pt know that I am placing referral to cardiology to have her assessed due to CP w/ walking @ OV yesterday.   Instructed her to call back with any quest or concerns.

## 2023-02-27 DIAGNOSIS — J449 Chronic obstructive pulmonary disease, unspecified: Secondary | ICD-10-CM | POA: Diagnosis not present

## 2023-02-27 DIAGNOSIS — I10 Essential (primary) hypertension: Secondary | ICD-10-CM | POA: Diagnosis not present

## 2023-03-01 DIAGNOSIS — H5202 Hypermetropia, left eye: Secondary | ICD-10-CM | POA: Diagnosis not present

## 2023-03-01 DIAGNOSIS — H524 Presbyopia: Secondary | ICD-10-CM | POA: Diagnosis not present

## 2023-03-01 DIAGNOSIS — H25813 Combined forms of age-related cataract, bilateral: Secondary | ICD-10-CM | POA: Diagnosis not present

## 2023-03-01 DIAGNOSIS — H47291 Other optic atrophy, right eye: Secondary | ICD-10-CM | POA: Diagnosis not present

## 2023-03-03 DIAGNOSIS — J449 Chronic obstructive pulmonary disease, unspecified: Secondary | ICD-10-CM | POA: Diagnosis not present

## 2023-03-30 ENCOUNTER — Encounter: Payer: Self-pay | Admitting: Internal Medicine

## 2023-03-30 ENCOUNTER — Ambulatory Visit: Payer: Medicare Other | Attending: Internal Medicine | Admitting: Internal Medicine

## 2023-03-30 VITALS — BP 116/70 | HR 75 | Ht 60.0 in | Wt 136.8 lb

## 2023-03-30 DIAGNOSIS — I429 Cardiomyopathy, unspecified: Secondary | ICD-10-CM | POA: Diagnosis not present

## 2023-03-30 DIAGNOSIS — I10 Essential (primary) hypertension: Secondary | ICD-10-CM | POA: Diagnosis not present

## 2023-03-30 DIAGNOSIS — I2510 Atherosclerotic heart disease of native coronary artery without angina pectoris: Secondary | ICD-10-CM | POA: Diagnosis not present

## 2023-03-30 MED ORDER — METOPROLOL SUCCINATE ER 50 MG PO TB24: 50.0000 mg | ORAL_TABLET | Freq: Every day | ORAL | 1 refills | Status: DC

## 2023-03-30 NOTE — Progress Notes (Signed)
Cardiology Office Note  Date: 03/30/2023   ID: [name], DOB [birth year], MRN [id number]  PCP:  Benetta Spar, MD  Cardiologist:  Marjo Bicker, MD Electrophysiologist:  None   Reason for Office Visit: New onset cardiomyopathy   History of Present Illness: [name] is a 72 y.o. female known to have new onset cardiomyopathy LVEF 35 to 40% with RWMA in 9/23, CAD s/p RCA PCI, CTO RCA remote history, history of CVA, HTN, HLD, COPD was referred to cardiology clinic for evaluation of new onset cardiomyopathy.   Patient was admitted to [facility] in 9/23 and transferred to [facility] for severe COPD exacerbation requiring intubation and mechanical ventilation. Echocardiogram showed LVEF 35 to 40% with RWMA consistent with a stress-induced cardiomyopathy versus LAD infarction.There was no repeat echocardiogram performed after recovery. She has limited functional mobility at home. Currently on home oxygen for COPD.  She has 2 episodes of chest pains every month and happens at rest and not with exertion.  No worsening of DOE.  No orthopnea or PND.  No leg swelling.  No palpitations, dizziness or syncope.  Past Medical History:  Diagnosis Date   Anxiety    Blind    right   Colitis 09/07/2011   CVA (cerebral infarction)    GERD (gastroesophageal reflux disease)    Hyperlipidemia    Hypertension    Myocardial infarct (HCC)    1997, 2004   Severe major depression with psychotic features (HCC)    2004   Stroke [facility]) 2002    Past Surgical History:  Procedure Laterality Date   ABDOMINAL HYSTERECTOMY     BILATERAL SALPINGOOPHORECTOMY     BIOPSY  05/08/2022   Procedure: BIOPSY;  Surgeon: Dolores Frame, MD;  Location: [facility];  Service: Gastroenterology;;   CHOLECYSTECTOMY N/A 10/15/2021   Procedure: LAPAROSCOPIC CHOLECYSTECTOMY WITH INTRAOPERATIVE CHOLANGIOGRAM;  Surgeon: Franky Macho, MD;  Location: [facility] ORS;  Service: General;  Laterality:  N/A;   COLONOSCOPY N/A 07/17/2014   RMR: Extremely redundant colon. Colonic diverticulosis. Inadequate prepartation precluded examination of all the rectal and colonic.  I suspect slow colonic transit in the setting of a markely redundant colon.   CORONARY ANGIOPLASTY WITH STENT PLACEMENT  [id number]   cyst removed from left wrist     ERCP N/A 10/10/2021   Procedure: ENDOSCOPIC RETROGRADE CHOLANGIOPANCREATOGRAPHY (ERCP);  Surgeon: Malissa Hippo, MD;  Location: [facility] ORS;  Service: Gastroenterology;  Laterality: N/A;   ERCP N/A 10/13/2021   Procedure: ENDOSCOPIC RETROGRADE CHOLANGIOPANCREATOGRAPHY (ERCP);  Surgeon: Malissa Hippo, MD;  Location: [facility] ORS;  Service: Gastroenterology;  Laterality: N/A;   ESOPHAGEAL DILATION  05/08/2022   Procedure: ESOPHAGEAL DILATION;  Surgeon: Marguerita Merles, Reuel Boom, MD;  Location: [facility];  Service: Gastroenterology;;  savory dilation'   ESOPHAGOGASTRODUODENOSCOPY  09/05/2004   Dr Jimmye Norman gastritis, candida esophagitis   ESOPHAGOGASTRODUODENOSCOPY  09/07/2011   HYQ:MVHQ, esophageal in the distal esophagus/Mild gastritis   ESOPHAGOGASTRODUODENOSCOPY (EGD) WITH PROPOFOL N/A 05/08/2022   Procedure: ESOPHAGOGASTRODUODENOSCOPY (EGD) WITH PROPOFOL;  Surgeon: Dolores Frame, MD;  Location: [facility];  Service: Gastroenterology;  Laterality: N/A;  with possible esophageal dilation   Lumps removed from each breast     SPHINCTEROTOMY N/A 10/10/2021   Procedure: SPHINCTEROTOMY INCOMPLETE, STONE NOT REMOVED;  Surgeon: Malissa Hippo, MD;  Location: [facility] OR[facility];  Service: Gastroenterology;  Laterality: N/A;    Current Outpatient Medications  Medication Sig Dispense Refill   acetaminophen (TYLENOL) 325 MG tablet Take 2  tablets (650 mg total) by mouth every 6 (six) hours as needed for mild pain or headache (or Fever >/= 101).     ascorbic acid (VITAMIN C) 500 MG tablet Take 1 tablet (500 mg total) by mouth daily. 30 tablet 1   aspirin EC 81 MG tablet  Take 81 mg by mouth daily.       citalopram (CELEXA) 20 MG tablet Take 20 mg by mouth daily.     cloNIDine (CATAPRES) 0.3 MG tablet Take 0.3 mg by mouth 2 (two) times daily.     clopidogrel (PLAVIX) 75 MG tablet Take 1 tablet (75 mg total) by mouth daily.     dextromethorphan-guaiFENesin (MUCINEX DM) 30-600 MG per 12 hr tablet Take 1 tablet by mouth 2 (two) times daily.     folic acid (FOLVITE) 1 MG tablet Take 1 tablet (1 mg total) by mouth daily.     Ipratropium-Albuterol (COMBIVENT) 20-100 MCG/ACT AERS respimat Inhale 1 puff into the lungs every 6 (six) hours as needed for wheezing or shortness of breath. 4 g 2   losartan (COZAAR) 25 MG tablet Take 0.5 tablets (12.5 mg total) by mouth daily.     metoprolol succinate (TOPROL-XL) 50 MG 24 hr tablet Take 50 mg by mouth 2 (two) times daily. Take with or immediately following a meal.     mirtazapine (REMERON) 7.5 MG tablet Take 7.5 mg by mouth at bedtime.     montelukast (SINGULAIR) 10 MG tablet Take 10 mg by mouth at bedtime.     omeprazole (PRILOSEC) 40 MG capsule Take 40 mg by mouth daily.     pantoprazole (PROTONIX) 40 MG tablet Take 40 mg by mouth daily.     Tiotropium Bromide-Olodaterol (STIOLTO RESPIMAT) 2.5-2.5 MCG/ACT AERS Inhale 2 puffs into the lungs daily. (Patient taking differently: Inhale 2 puffs into the lungs every morning.) 1 each 11   torsemide (DEMADEX) 20 MG tablet Take 40 mg by mouth 2 (two) times daily.     vitamin B-12 (CYANOCOBALAMIN) 500 MCG tablet Take 1 tablet (500 mcg total) by mouth daily.     metoprolol tartrate (LOPRESSOR) 50 MG tablet Take 50 mg by mouth 2 (two) times daily. (Patient not taking: Reported on 03/30/2023)     rosuvastatin (CRESTOR) 20 MG tablet Take 1 tablet (20 mg total) by mouth every evening. (Patient not taking: Reported on 03/30/2023)     No current facility-administered medications for this visit.   Allergies:  Aspirin, Bee venom, Contrast media [iodinated contrast media], Lisinopril, Motrin  [ibuprofen], Penicillins, and Dilaudid [hydromorphone hcl]   Social History: The patient  reports that she quit smoking about 5 years ago. Her smoking use included cigarettes. She has a 10.00 pack-year smoking history. She has never used smokeless tobacco. She reports current drug use. Drug: Marijuana. She reports that she does not drink alcohol.   Family History: The patient's family history includes Alzheimer's disease in her mother; Diabetes in her mother; Hypertension in her mother; Multiple sclerosis in her father.   ROS:  Please see the history of present illness. Otherwise, complete review of systems is positive for none  All other systems are reviewed and negative.   Physical Exam: VS:  BP 116/70   Pulse 75   Ht 5' (1.524 m)   Wt 136 lb 12.8 oz (62.1 kg)   SpO2 99% Comment: 3 l/min 24/7  BMI 26.72 kg/m , BMI Body mass index is 26.72 kg/m.  Wt Readings from Last 3 Encounters:  03/30/23 136 lb  12.8 oz (62.1 kg)  02/22/23 134 lb 12.8 oz (61.1 kg)  11/16/22 112 lb 12.8 oz (51.2 kg)    General: Patient appears comfortable at rest. HEENT: Conjunctiva and lids normal, oropharynx clear with moist mucosa. Neck: Supple, no elevated JVP or carotid bruits, no thyromegaly. Lungs: Clear to auscultation, nonlabored breathing at rest. Cardiac: Regular rate and rhythm, no S3 or significant systolic murmur, no pericardial rub. Abdomen: Soft, nontender, no hepatomegaly, bowel sounds present, no guarding or rebound. Extremities: No pitting edema, distal pulses 2+. Skin: Warm and dry. Musculoskeletal: No kyphosis. Neuropsychiatric: Alert and oriented x3, affect grossly appropriate.  Recent Labwork: 05/29/2022: TSH 0.554 06/09/2022: B Natriuretic Peptide 1,803.0 06/14/2022: ALT 43; AST 31; Hemoglobin 9.3; Platelets 89 06/15/2022: Magnesium 2.1 06/16/2022: BUN 5; Creatinine, Ser 0.71; Potassium 3.7; Sodium 140     Component Value Date/Time   CHOL 143 05/30/2022 0525   TRIG 26 05/30/2022 0525    HDL 66 05/30/2022 0525   CHOLHDL 2.2 05/30/2022 0525   VLDL 5 05/30/2022 0525   LDLCALC 72 05/30/2022 0525     Assessment and Plan:  # New onset cardiomyopathy with LVEF 35 to 40%, stress-induced versus LAD infarct in 9/23 # CAD s/p RCA PCI, CTO RCA (remote history), no angina -Patient had new onset cardiomyopathy with LVEF 35 to 40%, differential diagnosis include stress-induced versus LAD infarct.  This was in the setting of severe COPD exacerbation requiring intubation and mechanical ventilation in 9/23 hospitalization at [facility]. Will repeat a limited echocardiogram for any improvement in LVEF (given the differential diagnosis of stress-induced cardiomyopathy). If there is no improvement in LVEF, she will benefit from invasive ischemia evaluation with LHC and GDMT optimization. Continue current medications, metoprolol succinate 50 mg once daily and start twice daily, losartan 12.5 mg once daily. -Continue cardioprotective medications, aspirin 81 mg once daily and Plavix 75 mg once daily.  # HLD, at goal -Continue rosuvastatin 20 mg nightly.  Goal LDL less than 70.   I have spent a total of 45 minutes with patient reviewing chart, EKGs, labs and examining patient as well as establishing an assessment and plan that was discussed with the patient.  > 50% of time was spent in direct patient care.    Medication Adjustments/Labs and Tests Ordered: Current medicines are reviewed at length with the patient today.  Concerns regarding medicines are outlined above.   Tests Ordered: Orders Placed This Encounter  Procedures   EKG 12-Lead    Medication Changes: No orders of the defined types were placed in this encounter.   Disposition:  Follow up  3 months  Signed Ryelle Ruvalcaba Verne Spurr, MD, 03/30/2023 3:19 PM    [facility] [address] [place], [place], [place] [address]

## 2023-03-30 NOTE — Patient Instructions (Addendum)
Medication Instructions:  Your physician has recommended you make the following change in your medication:  Taking Metoprolol Succinate 50 mg daily Continue taking all other medications as prescribed  Labwork: None  Testing/Procedures: Your physician has requested that you have an echocardiogram. Echocardiography is a painless test that uses sound waves to create images of your heart. It provides your doctor with information about the size and shape of your heart and how well your heart's chambers and valves are working. This procedure takes approximately one hour. There are no restrictions for this procedure. Please do NOT wear cologne, perfume, aftershave, or lotions (deodorant is allowed). Please arrive 15 minutes prior to your appointment time.   Follow-Up: Your physician recommends that you schedule a follow-up appointment in: 3 months  Any Other Special Instructions Will Be Listed Below (If Applicable).  If you need a refill on your cardiac medications before your next appointment, please call your pharmacy.

## 2023-04-01 DIAGNOSIS — I5022 Chronic systolic (congestive) heart failure: Secondary | ICD-10-CM | POA: Diagnosis not present

## 2023-04-01 DIAGNOSIS — J449 Chronic obstructive pulmonary disease, unspecified: Secondary | ICD-10-CM | POA: Diagnosis not present

## 2023-04-01 DIAGNOSIS — I10 Essential (primary) hypertension: Secondary | ICD-10-CM | POA: Diagnosis not present

## 2023-04-01 DIAGNOSIS — J9610 Chronic respiratory failure, unspecified whether with hypoxia or hypercapnia: Secondary | ICD-10-CM | POA: Diagnosis not present

## 2023-04-01 DIAGNOSIS — Z9981 Dependence on supplemental oxygen: Secondary | ICD-10-CM | POA: Diagnosis not present

## 2023-04-01 DIAGNOSIS — I2510 Atherosclerotic heart disease of native coronary artery without angina pectoris: Secondary | ICD-10-CM | POA: Diagnosis not present

## 2023-04-02 DIAGNOSIS — J449 Chronic obstructive pulmonary disease, unspecified: Secondary | ICD-10-CM | POA: Diagnosis not present

## 2023-04-07 ENCOUNTER — Ambulatory Visit: Payer: Medicare Other | Attending: Internal Medicine

## 2023-04-07 DIAGNOSIS — I429 Cardiomyopathy, unspecified: Secondary | ICD-10-CM | POA: Diagnosis not present

## 2023-04-08 LAB — ECHOCARDIOGRAM LIMITED
Calc EF: 43.5 %
Est EF: 50
S' Lateral: 2.9 cm
Single Plane A2C EF: 41.2 %
Single Plane A4C EF: 47.6 %

## 2023-05-02 DIAGNOSIS — I10 Essential (primary) hypertension: Secondary | ICD-10-CM | POA: Diagnosis not present

## 2023-05-02 DIAGNOSIS — J449 Chronic obstructive pulmonary disease, unspecified: Secondary | ICD-10-CM | POA: Diagnosis not present

## 2023-05-03 DIAGNOSIS — J449 Chronic obstructive pulmonary disease, unspecified: Secondary | ICD-10-CM | POA: Diagnosis not present

## 2023-05-03 DIAGNOSIS — J9612 Chronic respiratory failure with hypercapnia: Secondary | ICD-10-CM | POA: Diagnosis not present

## 2023-05-05 ENCOUNTER — Telehealth: Payer: Self-pay

## 2023-05-05 NOTE — Telephone Encounter (Signed)
Patient informed and verbalized understanding. Copy sent to PCP 

## 2023-05-05 NOTE — Telephone Encounter (Signed)
-----   Message from Vishnu P Mallipeddi sent at 05/04/2023  7:23 PM EDT ----- Heart pumping function improved from 35 to 40% to 50% (55% and more is normal).  No need to start any new medications.

## 2023-06-02 DIAGNOSIS — I10 Essential (primary) hypertension: Secondary | ICD-10-CM | POA: Diagnosis not present

## 2023-06-02 DIAGNOSIS — J449 Chronic obstructive pulmonary disease, unspecified: Secondary | ICD-10-CM | POA: Diagnosis not present

## 2023-06-17 DIAGNOSIS — Z23 Encounter for immunization: Secondary | ICD-10-CM | POA: Diagnosis not present

## 2023-07-02 ENCOUNTER — Ambulatory Visit: Payer: Medicare Other | Admitting: Internal Medicine

## 2023-07-02 DIAGNOSIS — I10 Essential (primary) hypertension: Secondary | ICD-10-CM | POA: Diagnosis not present

## 2023-07-02 DIAGNOSIS — J449 Chronic obstructive pulmonary disease, unspecified: Secondary | ICD-10-CM | POA: Diagnosis not present

## 2023-07-13 ENCOUNTER — Encounter: Payer: Self-pay | Admitting: Internal Medicine

## 2023-07-13 ENCOUNTER — Ambulatory Visit: Payer: Medicare Other | Attending: Internal Medicine | Admitting: Internal Medicine

## 2023-07-13 VITALS — BP 114/78 | HR 75 | Ht 60.0 in | Wt 144.2 lb

## 2023-07-13 DIAGNOSIS — Z7902 Long term (current) use of antithrombotics/antiplatelets: Secondary | ICD-10-CM | POA: Insufficient documentation

## 2023-07-13 DIAGNOSIS — I5032 Chronic diastolic (congestive) heart failure: Secondary | ICD-10-CM | POA: Insufficient documentation

## 2023-07-13 NOTE — Patient Instructions (Signed)
Medication Instructions:  Your physician has recommended you make the following change in your medication:  Stop taking aspirin  Continue taking all other medications as prescribed  Labwork: None  Testing/Procedures: None  Follow-Up: Your physician recommends that you schedule a follow-up appointment in: 1 year. You will receive a reminder call in about 8 months reminding you to schedule your appointment. If you don't receive this call, please contact our office.   Any Other Special Instructions Will Be Listed Below (If Applicable).  If you need a refill on your cardiac medications before your next appointment, please call your pharmacy.

## 2023-07-13 NOTE — Progress Notes (Signed)
Cardiology Office Note  Date: 07/13/2023   ID: Heartly, Hauptman 04-07-1951, MRN [id number]  PCP:  Benetta Spar, MD  Cardiologist:  Marjo Bicker, MD Electrophysiologist:  None    History of Present Illness: [name] is a 72 y.o. female known to have new onset cardiomyopathy LVEF 35 to 40% with RWMA in 9/23, CAD s/p RCA PCI, CTO RCA remote history, history of CVA, HTN, HLD, COPD is here for follow-up visit.   Patient was admitted to [facility] in 9/23 and transferred to [facility] for severe COPD exacerbation requiring intubation and mechanical ventilation. Echocardiogram showed LVEF 35 to 40% with RWMA consistent with a stress-induced cardiomyopathy versus LAD infarction.repeat echocardiogram in 03/2023 showed improved LVEF to 50%.  Patient is here for follow-up visit, overall doing great, no symptoms, currently on home O2.  No DOE, orthopnea, PND, angina, dizziness, syncope, leg swelling.  Past Medical History:  Diagnosis Date   Anxiety    Blind    right   Colitis 09/07/2011   CVA (cerebral infarction)    GERD (gastroesophageal reflux disease)    Hyperlipidemia    Hypertension    Myocardial infarct (HCC)    1997, 2004   Severe major depression with psychotic features (HCC)    2004   Stroke [facility]) 2002    Past Surgical History:  Procedure Laterality Date   ABDOMINAL HYSTERECTOMY     BILATERAL SALPINGOOPHORECTOMY     BIOPSY  05/08/2022   Procedure: BIOPSY;  Surgeon: Dolores Frame, MD;  Location: [facility];  Service: Gastroenterology;;   CHOLECYSTECTOMY N/A 10/15/2021   Procedure: LAPAROSCOPIC CHOLECYSTECTOMY WITH INTRAOPERATIVE CHOLANGIOGRAM;  Surgeon: Franky Macho, MD;  Location: [facility] ORS;  Service: General;  Laterality: N/A;   COLONOSCOPY N/A 07/17/2014   RMR: Extremely redundant colon. Colonic diverticulosis. Inadequate prepartation precluded examination of all the rectal and colonic.  I suspect slow colonic transit in the  setting of a markely redundant colon.   CORONARY ANGIOPLASTY WITH STENT PLACEMENT  [id number]   cyst removed from left wrist     ERCP N/A 10/10/2021   Procedure: ENDOSCOPIC RETROGRADE CHOLANGIOPANCREATOGRAPHY (ERCP);  Surgeon: Malissa Hippo, MD;  Location: [facility] ORS;  Service: Gastroenterology;  Laterality: N/A;   ERCP N/A 10/13/2021   Procedure: ENDOSCOPIC RETROGRADE CHOLANGIOPANCREATOGRAPHY (ERCP);  Surgeon: Malissa Hippo, MD;  Location: [facility] ORS;  Service: Gastroenterology;  Laterality: N/A;   ESOPHAGEAL DILATION  05/08/2022   Procedure: ESOPHAGEAL DILATION;  Surgeon: Marguerita Merles, Reuel Boom, MD;  Location: [facility];  Service: Gastroenterology;;  savory dilation'   ESOPHAGOGASTRODUODENOSCOPY  09/05/2004   Dr Jimmye Norman gastritis, candida esophagitis   ESOPHAGOGASTRODUODENOSCOPY  09/07/2011   IHK:VQQV, esophageal in the distal esophagus/Mild gastritis   ESOPHAGOGASTRODUODENOSCOPY (EGD) WITH PROPOFOL N/A 05/08/2022   Procedure: ESOPHAGOGASTRODUODENOSCOPY (EGD) WITH PROPOFOL;  Surgeon: Dolores Frame, MD;  Location: [facility];  Service: Gastroenterology;  Laterality: N/A;  with possible esophageal dilation   Lumps removed from each breast     SPHINCTEROTOMY N/A 10/10/2021   Procedure: SPHINCTEROTOMY INCOMPLETE, STONE NOT REMOVED;  Surgeon: Malissa Hippo, MD;  Location: [facility] OR[facility];  Service: Gastroenterology;  Laterality: N/A;    Current Outpatient Medications  Medication Sig Dispense Refill   acetaminophen (TYLENOL) 325 MG tablet Take 2 tablets (650 mg total) by mouth every 6 (six) hours as needed for mild pain or headache (or Fever >/= 101).     ascorbic acid (VITAMIN C) 500 MG tablet Take 1 tablet (500 mg total)  by mouth daily. 30 tablet 1   aspirin EC 81 MG tablet Take 81 mg by mouth daily.       citalopram (CELEXA) 20 MG tablet Take 20 mg by mouth daily.     cloNIDine (CATAPRES) 0.3 MG tablet Take 0.3 mg by mouth 2 (two) times daily.     clopidogrel (PLAVIX) 75 MG  tablet Take 1 tablet (75 mg total) by mouth daily.     dextromethorphan-guaiFENesin (MUCINEX DM) 30-600 MG per 12 hr tablet Take 1 tablet by mouth 2 (two) times daily.     folic acid (FOLVITE) 1 MG tablet Take 1 tablet (1 mg total) by mouth daily.     Ipratropium-Albuterol (COMBIVENT) 20-100 MCG/ACT AERS respimat Inhale 1 puff into the lungs every 6 (six) hours as needed for wheezing or shortness of breath. 4 g 2   losartan (COZAAR) 25 MG tablet Take 0.5 tablets (12.5 mg total) by mouth daily.     metoprolol succinate (TOPROL XL) 50 MG 24 hr tablet Take 1 tablet (50 mg total) by mouth daily. Take with or immediately following a meal. 90 tablet 1   metoprolol tartrate (LOPRESSOR) 50 MG tablet Take 50 mg by mouth daily.     mirtazapine (REMERON) 7.5 MG tablet Take 7.5 mg by mouth at bedtime.     montelukast (SINGULAIR) 10 MG tablet Take 10 mg by mouth at bedtime.     omeprazole (PRILOSEC) 40 MG capsule Take 40 mg by mouth daily.     pantoprazole (PROTONIX) 40 MG tablet Take 40 mg by mouth daily.     rosuvastatin (CRESTOR) 20 MG tablet Take 1 tablet (20 mg total) by mouth every evening.     Tiotropium Bromide-Olodaterol (STIOLTO RESPIMAT) 2.5-2.5 MCG/ACT AERS Inhale 2 puffs into the lungs daily. (Patient taking differently: Inhale 2 puffs into the lungs every morning.) 1 each 11   torsemide (DEMADEX) 20 MG tablet Take 40 mg by mouth 2 (two) times daily.     vitamin B-12 (CYANOCOBALAMIN) 500 MCG tablet Take 1 tablet (500 mcg total) by mouth daily.     No current facility-administered medications for this visit.   Allergies:  Aspirin, Bee venom, Contrast media [iodinated contrast media], Lisinopril, Motrin [ibuprofen], Penicillins, and Dilaudid [hydromorphone hcl]   Social History: The patient  reports that she quit smoking about 5 years ago. Her smoking use included cigarettes. She started smoking about 45 years ago. She has a 10 pack-year smoking history. She has never used smokeless tobacco. She  reports current drug use. Drug: Marijuana. She reports that she does not drink alcohol.   Family History: The patient's family history includes Alzheimer's disease in her mother; Diabetes in her mother; Hypertension in her mother; Multiple sclerosis in her father.   ROS:  Please see the history of present illness. Otherwise, complete review of systems is positive for none  All other systems are reviewed and negative.   Physical Exam: VS:  BP 114/78   Pulse 75   Ht 5' (1.524 m)   Wt 144 lb 3.2 oz (65.4 kg)   SpO2 99%   BMI 28.16 kg/m , BMI Body mass index is 28.16 kg/m.  Wt Readings from Last 3 Encounters:  07/13/23 144 lb 3.2 oz (65.4 kg)  03/30/23 136 lb 12.8 oz (62.1 kg)  02/22/23 134 lb 12.8 oz (61.1 kg)    General: Patient appears comfortable at rest. HEENT: Conjunctiva and lids normal, oropharynx clear with moist mucosa. Neck: Supple, no elevated JVP or carotid bruits,  no thyromegaly. Lungs: Clear to auscultation, nonlabored breathing at rest. Cardiac: Regular rate and rhythm, no S3 or significant systolic murmur, no pericardial rub. Abdomen: Soft, nontender, no hepatomegaly, bowel sounds present, no guarding or rebound. Extremities: No pitting edema, distal pulses 2+. Skin: Warm and dry. Musculoskeletal: No kyphosis. Neuropsychiatric: Alert and oriented x3, affect grossly appropriate.  Recent Labwork: No results found for requested labs within last 365 days.     Component Value Date/Time   CHOL 143 05/30/2022 0525   TRIG 26 05/30/2022 0525   HDL 66 05/30/2022 0525   CHOLHDL 2.2 05/30/2022 0525   VLDL 5 05/30/2022 0525   LDLCALC 72 05/30/2022 0525     Assessment and Plan:  Stress-induced cardiomyopathy / Hfimpef (35 to 40% in 2023 improved to 50% in 2024) CAD S/P RCA PCI in 1997 with residual CTO RCA on the repeat cath IN ?2004, angina free HLD, at goal HTN, controlled COPD on home O2   -Asymptomatic, compensated.  Repeat echocardiogram in 03/2023 showed LVEF  improved from 35 to 40% to 50%. Currently on GDMT, metoprolol succinate 50 mg once daily and losartan 12.5 mg once daily.  She also has been taking aspirin and Plavix since 2023 hospitalization, will discontinue aspirin and continue Plavix due to CTO RCA.  No interval angina.  Continue current antihypertensive medications, torsemide 40 mg twice daily, metoprolol succinate 50 mg once daily, losartan 12.5 mg once daily and clonidine 0.3 mg twice daily, HTN is managed by her PCP.   I have spent a total of 30 minutes reviewing her chart, EKG, labs, notes, echo, face-to-face discussion/counseling of her medical condition, pathophysiology, evaluation, management, and documenting the findings in the note.  I answered all her questions.   Disposition:  Follow up  1 year  Signed Malichi Palardy Verne Spurr, MD, 07/13/2023 11:39 AM    [facility] at [facility] [address] [place], [place], [place] [address]

## 2023-08-02 DIAGNOSIS — I10 Essential (primary) hypertension: Secondary | ICD-10-CM | POA: Diagnosis not present

## 2023-08-02 DIAGNOSIS — J449 Chronic obstructive pulmonary disease, unspecified: Secondary | ICD-10-CM | POA: Diagnosis not present

## 2023-08-09 ENCOUNTER — Ambulatory Visit: Payer: Medicare Other | Admitting: Internal Medicine

## 2023-08-31 NOTE — Progress Notes (Unsigned)
[name], female    DOB: 1951-01-11    MRN: [id number]   Brief patient profile:  80 yobf quit smoking 07/2017 and w/in year got started on inhalers  referred to pulmonary clinic in [facility]  10/01/2022 by Felecia Shelling  for copd/ 02 dep   History of Present Illness  10/01/2022  Pulmonary/ 1st office eval/ [name] / [place] [facility] out of rehab since Jul 04 2022 on combivent prn and 02 prn as well  Chief Complaint  Patient presents with   Consult    Pt consult was admitted 9/8-9/27 for hypoxia, she is currently using 2L O2 PRN - she is having increased SOB.   Dyspnea:  cleaning house s 02  Cough: rattling but min mucoid production  Sleep: flat bed 2 pillows  SABA use: combivent but not using smi correctly  02: 2lpm hs/ sitting 2lpm  most time does not wear it walking  Rec Plan A = Automatic = Always=    Stiolto 2 pffs each am  Work on inhaler technique:  Plan B = Backup (to supplement plan A, not to replace it) Only use your albuterol-ipatropium inhaler as a rescue medication 0xygen 3lpm but ok to increase if needed to keep over 90% walking but remember to turn back down if sats well above 90% at rest.   Referred to cards for ex  cp/ sys chf by last echo > seen 07/13/23 and denied cp, doe and echo EF up to 50% so f/u yearly, no change in Rx     09/01/2023  f/u ov/[place] office/[name] re:  COPD/emphysema  Gold stage?  02 dep  maint on stiolto  pfts and ldsct not done as rec  Chief Complaint  Patient presents with   COPD   Shortness of Breath  Dyspnea:  room to room at home off 02 then sits down and uses 02 to recover  Cough: minimal > clear in am  Sleeping: bed is level 2 pillows no  resp cc  SABA use: used twice since last visit, doe not know upper  limit for prn use  02: 3lpm hs 24/7 x when walking   Lung cancer screening: not done   No obvious day to day or daytime variability or assoc excess/ purulent sputum or mucus plugs or hemoptysis or cp or chest tightness,  subjective wheeze or overt sinus or hb symptoms.    Also denies any obvious fluctuation of symptoms with weather or environmental changes or other aggravating or alleviating factors except as outlined above   No unusual exposure hx or h/o childhood pna/ asthma or knowledge of premature birth.  Current Allergies, Complete Past Medical History, Past Surgical History, Family History, and Social History were reviewed in [employer] record.  ROS  The following are not active complaints unless bolded Hoarseness, sore throat, dysphagia, dental problems, itching, sneezing,  nasal congestion or discharge of excess mucus or purulent secretions, ear ache,   fever, chills, sweats, unintended wt loss or wt gain, classically pleuritic or exertional cp,  orthopnea pnd or arm/hand swelling  or leg swelling, presyncope, palpitations, abdominal pain, anorexia, nausea, vomiting, diarrhea  or change in bowel habits or change in bladder habits, change in stools or change in urine, dysuria, hematuria,  rash, arthralgias, visual complaints, headache, numbness, weakness or ataxia or problems with walking or coordination,  change in mood or  memory.        Current Meds  Medication Sig   acetaminophen (TYLENOL) 325 MG tablet  Take 2 tablets (650 mg total) by mouth every 6 (six) hours as needed for mild pain or headache (or Fever >/= 101).   ascorbic acid (VITAMIN C) 500 MG tablet Take 1 tablet (500 mg total) by mouth daily.   citalopram (CELEXA) 20 MG tablet Take 20 mg by mouth daily.   cloNIDine (CATAPRES) 0.3 MG tablet Take 0.3 mg by mouth 2 (two) times daily.   clopidogrel (PLAVIX) 75 MG tablet Take 1 tablet (75 mg total) by mouth daily.   dextromethorphan-guaiFENesin (MUCINEX DM) 30-600 MG per 12 hr tablet Take 1 tablet by mouth 2 (two) times daily.   fluticasone (FLONASE) 50 MCG/ACT nasal spray Place 1 spray into both nostrils daily.   folic acid (FOLVITE) 1 MG tablet Take 1 tablet (1 mg total)  by mouth daily.   Ipratropium-Albuterol (COMBIVENT) 20-100 MCG/ACT AERS respimat Inhale 1 puff into the lungs every 6 (six) hours as needed for wheezing or shortness of breath.   losartan (COZAAR) 25 MG tablet Take 0.5 tablets (12.5 mg total) by mouth daily.   metoprolol succinate (TOPROL XL) 50 MG 24 hr tablet Take 1 tablet (50 mg total) by mouth daily. Take with or immediately following a meal.   mirtazapine (REMERON) 7.5 MG tablet Take 7.5 mg by mouth at bedtime.   montelukast (SINGULAIR) 10 MG tablet Take 10 mg by mouth at bedtime.   omeprazole (PRILOSEC) 40 MG capsule Take 40 mg by mouth daily.   pantoprazole (PROTONIX) 40 MG tablet Take 40 mg by mouth daily.   Tiotropium Bromide-Olodaterol (STIOLTO RESPIMAT) 2.5-2.5 MCG/ACT AERS Inhale 2 puffs into the lungs daily. (Patient taking differently: Inhale 2 puffs into the lungs every morning.)   torsemide (DEMADEX) 20 MG tablet Take 40 mg by mouth 2 (two) times daily.   vitamin B-12 (CYANOCOBALAMIN) 500 MCG tablet Take 1 tablet (500 mcg total) by mouth daily.          Past Medical History:  Diagnosis Date   Anxiety    Blind    right   Colitis 09/07/2011   CVA (cerebral infarction)    GERD (gastroesophageal reflux disease)    Hyperlipidemia    Hypertension    Myocardial infarct (HCC)    1997, 2004   Severe major depression with psychotic features (HCC)    2004   Stroke [facility]) 2002       Objective:    Wts  09/01/2023     138   02/22/2023         134    11/16/22 112 lb 12.8 oz (51.2 kg)  10/01/22 108 lb 9.6 oz (49.3 kg)  06/15/22 111 lb 4.3 oz (50.5 kg)    Vital signs reviewed  09/01/2023  - Note at rest 02 sats  97% on 3lpm cont    General appearance:    amb somber bf nad    HEENT : Oropharynx  clear      NECK :  without  apparent JVD/ palpable Nodes/TM    LUNGS: no acc muscle use,  Mild barrel  contour chest wall with bilateral  Distant bs s audible wheeze and  without cough on insp or exp maneuvers  and mild   Hyperresonant  to  percussion bilaterally     CV:  RRR  no s3 or murmur or increase in P2, and no edema   ABD:  soft and nontender with pos end  insp Hoover's  in the supine position.  No bruits or organomegaly appreciated   MS:  ext warm without deformities Or obvious joint restrictions  calf tenderness, cyanosis or clubbing     SKIN: warm and dry without lesions    NEURO:  alert, approp, nl sensorium with  no motor or cerebellar deficits apparent.                  Assessment

## 2023-09-01 ENCOUNTER — Ambulatory Visit: Payer: Medicare Other | Admitting: Internal Medicine

## 2023-09-01 ENCOUNTER — Telehealth: Payer: Self-pay | Admitting: Internal Medicine

## 2023-09-01 ENCOUNTER — Encounter: Payer: Self-pay | Admitting: Internal Medicine

## 2023-09-01 VITALS — BP 94/63 | HR 126 | Ht 60.0 in | Wt 138.0 lb

## 2023-09-01 DIAGNOSIS — J9612 Chronic respiratory failure with hypercapnia: Secondary | ICD-10-CM | POA: Diagnosis not present

## 2023-09-01 DIAGNOSIS — J9611 Chronic respiratory failure with hypoxia: Secondary | ICD-10-CM

## 2023-09-01 DIAGNOSIS — Z87891 Personal history of nicotine dependence: Secondary | ICD-10-CM | POA: Diagnosis not present

## 2023-09-01 DIAGNOSIS — J449 Chronic obstructive pulmonary disease, unspecified: Secondary | ICD-10-CM

## 2023-09-01 DIAGNOSIS — I10 Essential (primary) hypertension: Secondary | ICD-10-CM | POA: Diagnosis not present

## 2023-09-01 MED ORDER — FAMOTIDINE 20 MG PO TABS: ORAL_TABLET | ORAL | 11 refills | Status: DC

## 2023-09-01 NOTE — Telephone Encounter (Signed)
Patient aware of PFT appointment Thursday 10/14/23 at 8:30 am at [facility] time is 8:15 am at the firt floor registration desk for check in---will mail information to patient

## 2023-09-01 NOTE — Assessment & Plan Note (Signed)
D/c on 2lpm with HC03  = 31  06/16/22  - 10/01/2022 Patient Saturations on Room Air at Rest = 88%  while Ambulating = 86% / on 3 Liters of oxygen while Ambulating = 94% - 02/22/2023   Walked on 2.5 cont lpm  x  2  lap(s) =  approx 300  ft  @ slow pace, stopped due to cp/sob  with lowest 02 sats 95%  - 09/01/2023   Walked on 2lpm cont  x  1  lap(s) =  approx 150  ft  @ mod then slowed pace, stopped due to hip pain  with lowest 02 sats 99%  Again advised: Make sure you check your oxygen saturation  AT  your highest level of activity (not after you stop)   to be sure it stays over 90% and adjust  02 flow upward to maintain this level if needed but remember to turn it back to previous settings when you stop (to conserve your supply).

## 2023-09-01 NOTE — Assessment & Plan Note (Signed)
Quit  2018  - referred for LDS  02/22/2023  and .09/01/2023   Low-dose CT lung cancer screening is recommended for patients who are 51-74 years of age with a 20+ pack-year history of smoking and who are currently smoking or quit <=15 years ago. No coughing up blood  No unintentional weight loss of > 15 pounds in the last 6 months - pt is eligible for scanning yearly until 2030 > referred again   Also strongly rec Pulmonary rehab > declined for now esp since back/ hip more limiting than breathing  Each maintenance medication was reviewed in detail including emphasizing most importantly the difference between maintenance and prns and under what circumstances the prns are to be triggered using an action plan format where appropriate.  Total time for H and P, chart review, counseling, reviewing hfa/smi/ 02/pulse ox  device(s) , directly observing portions of ambulatory 02 saturation study/ and generating customized AVS unique to this office visit / same day charting  > 30 min

## 2023-09-01 NOTE — Patient Instructions (Addendum)
Pantoprazole (protonix) 40 mg   Take  30-60 min before first meal of the day and Pepcid (famotidine)  20 mg after supper until return to office - this is the best way to tell whether stomach acid is contributing to your problem.    If you continue to have a problem swallowing on the above and off all mint/ menthol and chocolate you will need to check with Dr Felecia Shelling about a referral to GI doctor   Only use your albuterol as a rescue medication to be used if you can't catch your breath by resting or doing a relaxed purse lip breathing pattern.  - The less you use it, the better it will work when you need it. - Ok to use up to 2 puffs  every 4 hours if you must but call for immediate appointment if use goes up over your usual need - Don't leave home without it !!  (think of it like the spare tire for your car)   Work on inhaler technique:  relax and gently blow all the way out then take a nice smooth full deep breath back in, triggering the inhaler at same time you start breathing in.  Hold breath in for at least  5 seconds if you can.    Make sure you check your oxygen saturation  AT  your highest level of activity (not after you stop)   to be sure it stays over 90% and adjust  02 flow upward to maintain this level if needed but remember to turn it back to previous settings when you stop (to conserve your supply).   My office will be contacting you by phone for referral for pfts and lung cancer screen @  [phone]   - if you don't hear back from my office within one week please call us back or notify us thru MyChart and we'll address it right away.   Please schedule a follow up visit in 3 months but call sooner if needed  with all respiratory  medications [name]

## 2023-09-01 NOTE — Assessment & Plan Note (Addendum)
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  - 10/01/2022  After extensive coaching inhaler device,  effectiveness =    60% smi (short ti)  > try stiolto   >>>  09/01/2023  After extensive coaching inhaler device,  effectiveness =    60% with SMI (short Ti) > continue stiolto and prn saba / PFTs ordered   comment Pt is Group B in terms of symptom/risk and laba/lama therefore appropriate rx at this point >>>  stiolto 2 each am plus saba more appropriately: Re SABA :  I spent extra time with pt today reviewing appropriate use of albuterol for prn use on exertion with the following points: 1) saba is for relief of sob that does not improve by walking a slower pace or resting but rather if the pt does not improve after trying this first. 2) If the pt is convinced, as many are, that saba helps recover from activity faster then it's easy to tell if this is the case by re-challenging : ie stop, take the inhaler, then p 5 minutes try the exact same activity (intensity of workload) that just caused the symptoms and see if they are substantially diminished or not after saba 3) if there is an activity that reproducibly causes the symptoms, try the saba 15 min before the activity on alternate days   If in fact the saba really does help, then fine to continue to use it prn but advised may need to look closer at the maintenance regimen being used to achieve better control of airways disease with exertion.

## 2023-09-13 IMAGING — DX DG CHEST 2V
2 series · 2 of 2 positions shown · non-contrast
Comparison: Chest x-ray 04/19/2007.

CLINICAL DATA: Cough and chest pain.

EXAM:
CHEST - 2 VIEW

[chest lat]
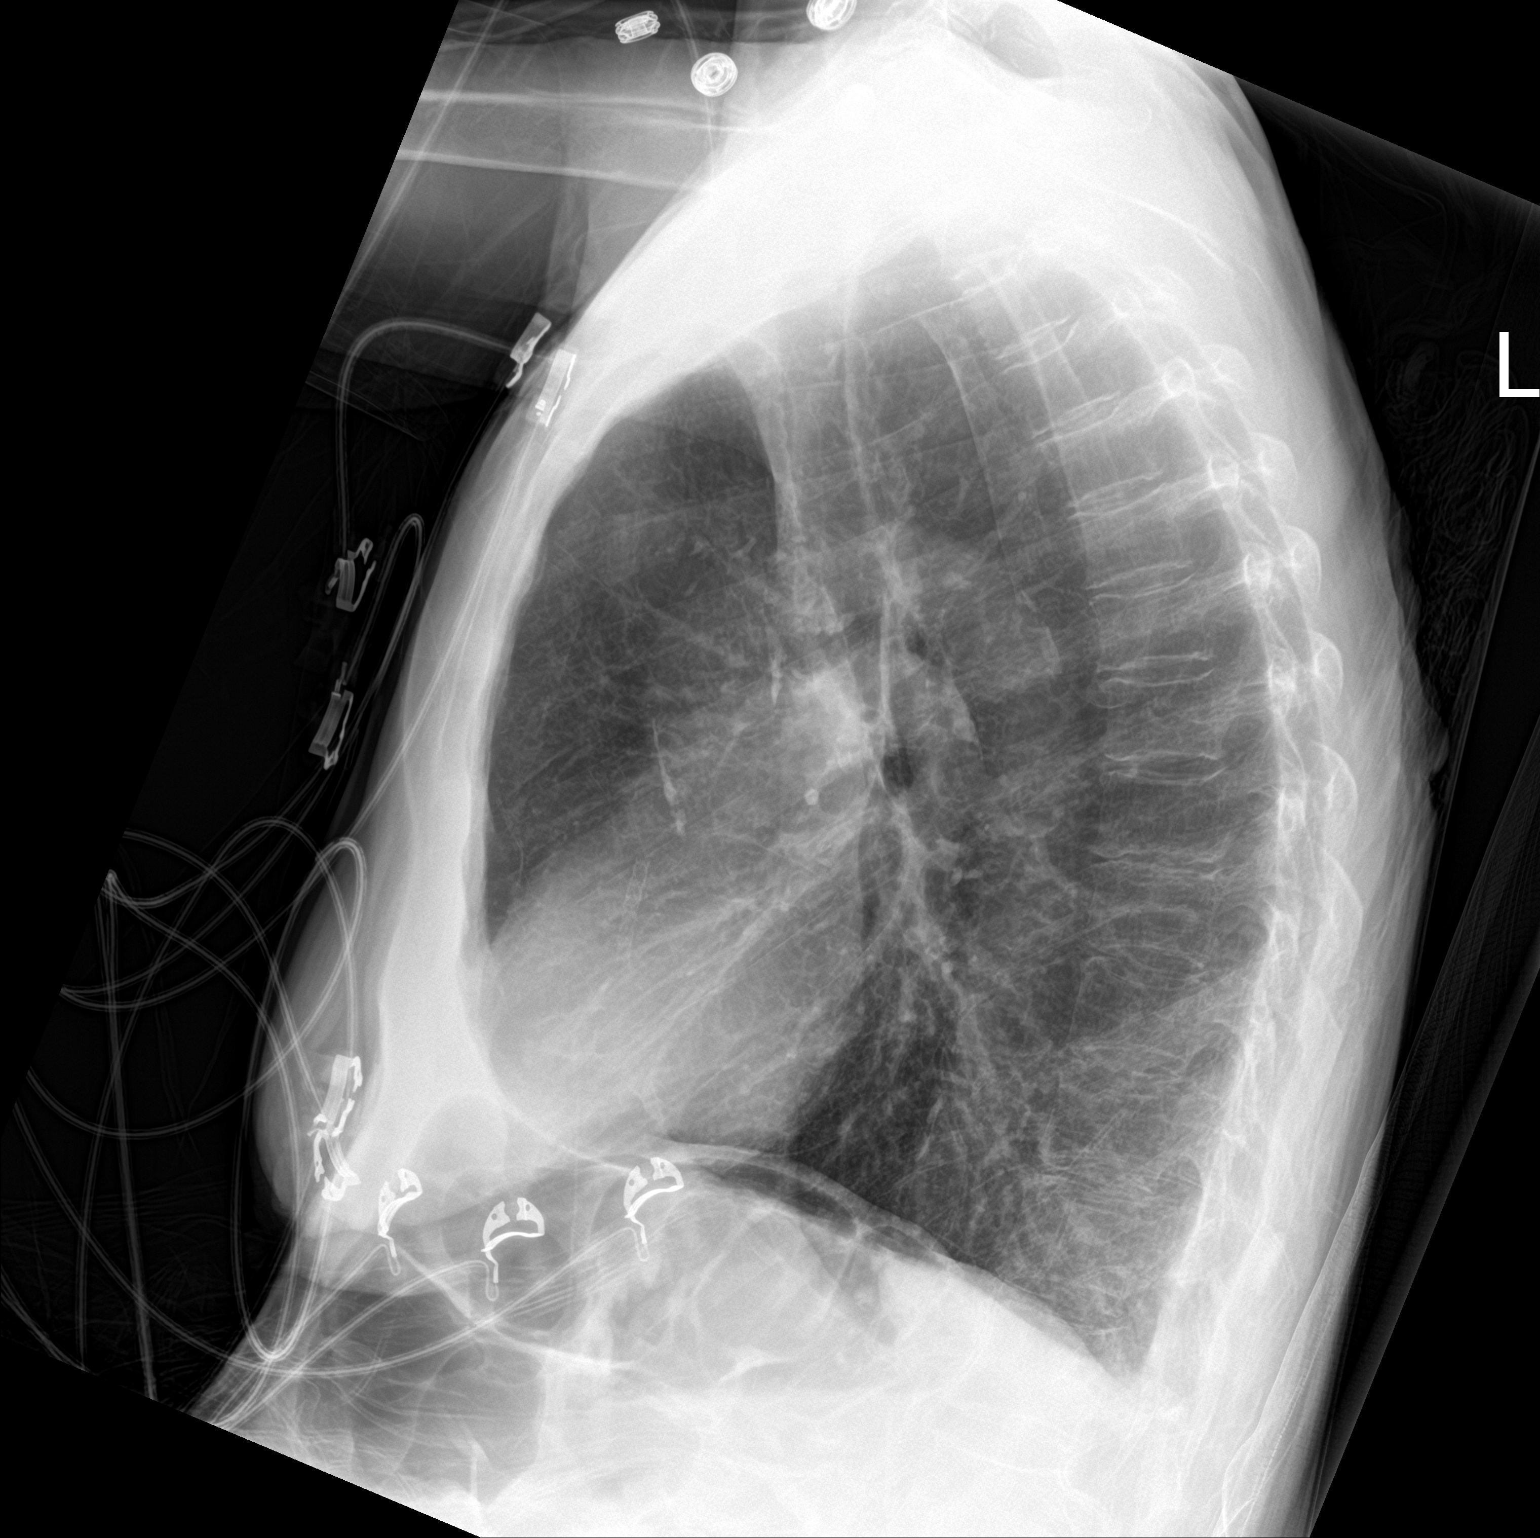

[chest ap]
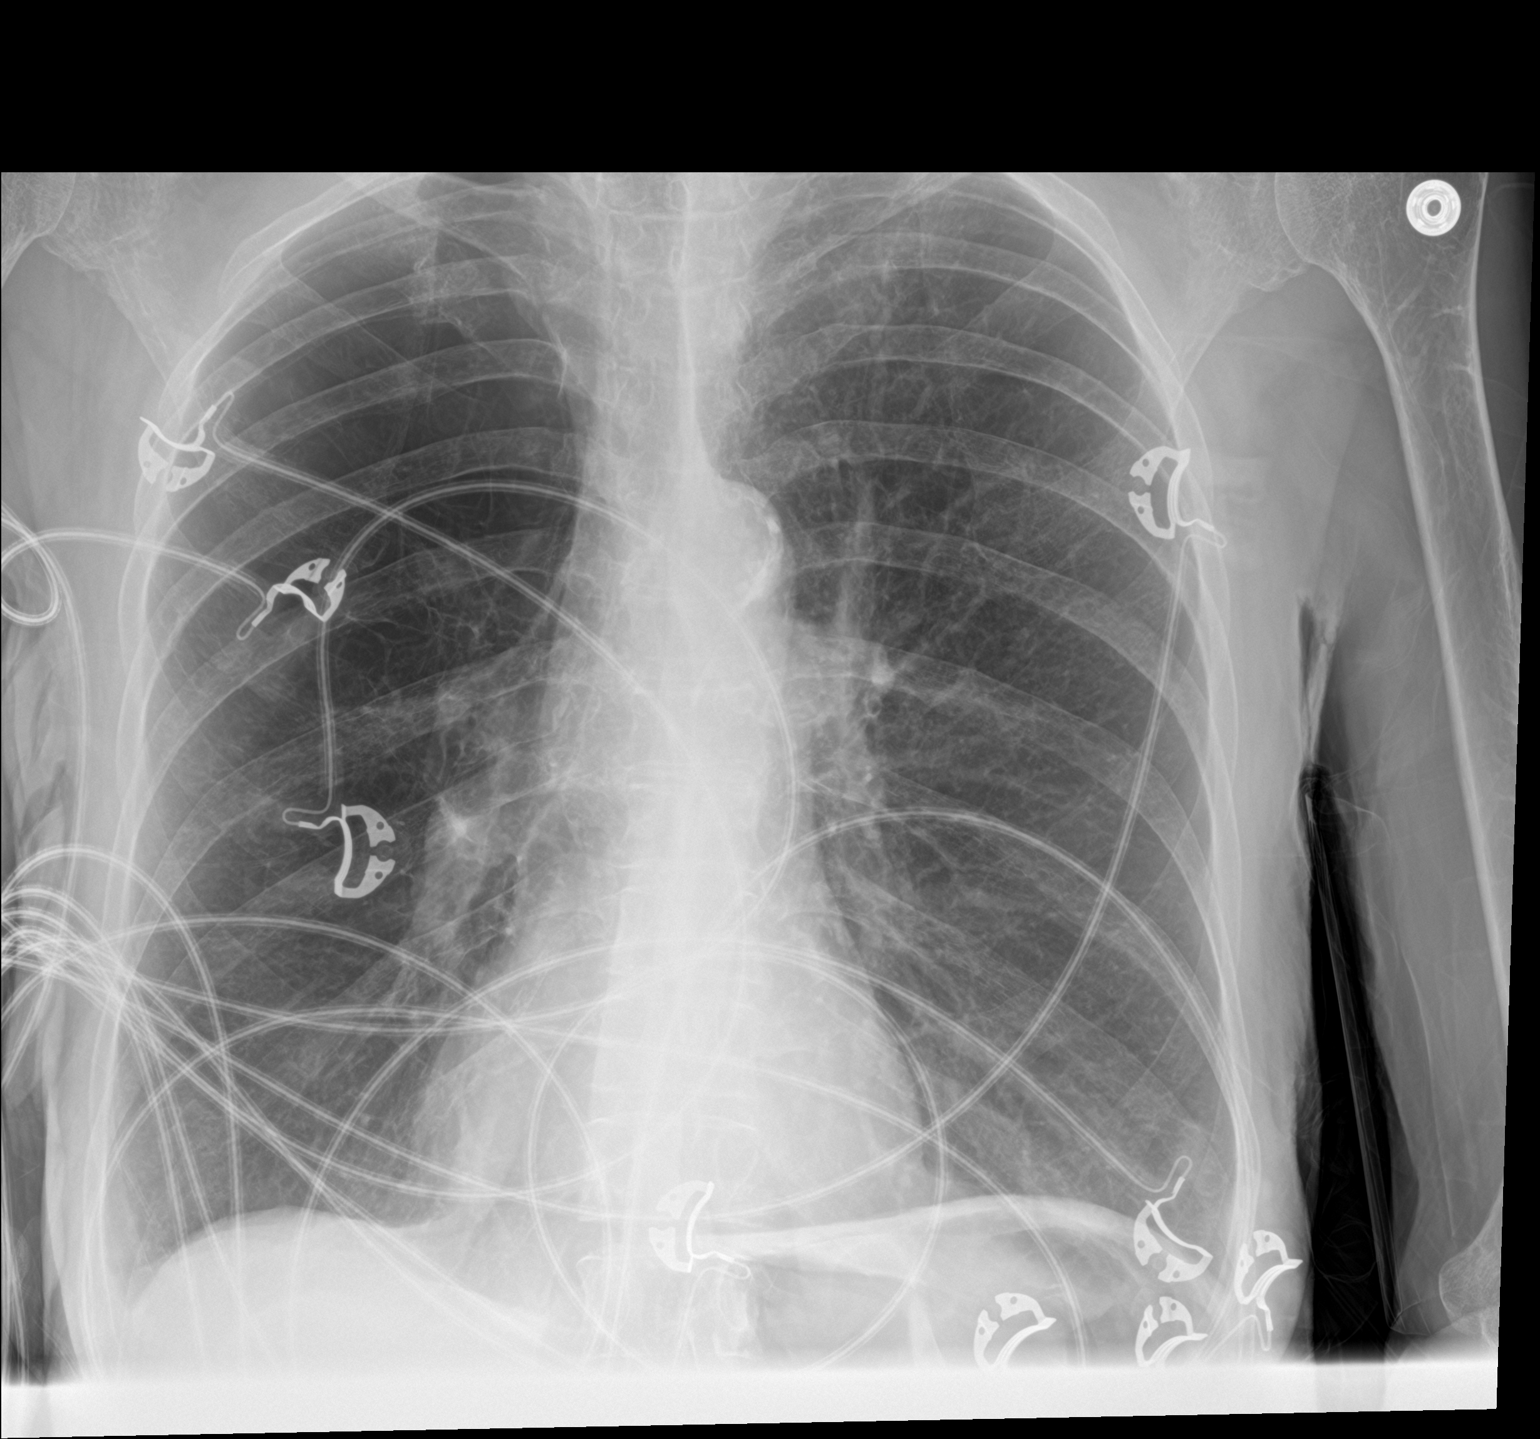

[2 of 2 positions shown; findings below may reference images not displayed]

FINDINGS: There are emphysematous changes in the right upper lobe, unchanged.
The lungs are otherwise clear. No pleural effusion or pneumothorax.
No acute fractures.
IMPRESSION: 1. No acute cardiopulmonary process.
2.  Emphysema ([id number]).

## 2023-09-13 IMAGING — CT CT ABD-PELV W/O CM
2 of 4 series · 15 of 46 positions shown, 17 images · non-contrast
Comparison: 07/16/2014

CLINICAL DATA: Nausea, vomiting and abdominal pain.



[Series 2: axial st · axial · 0.62mm/px · z∈[-833,-478]mm · 12 of 83 slices shown, 14 images]
[im 6/83  soft-tissue]
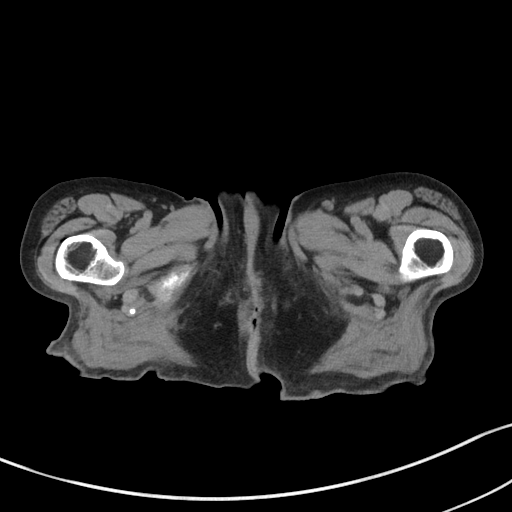
[im 6/83  bone]
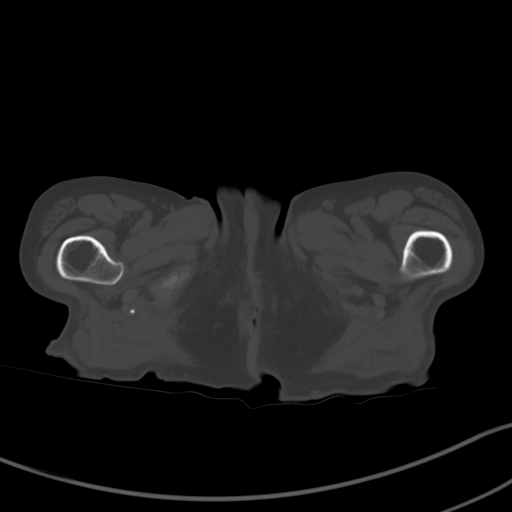
[im 11/83  soft-tissue]
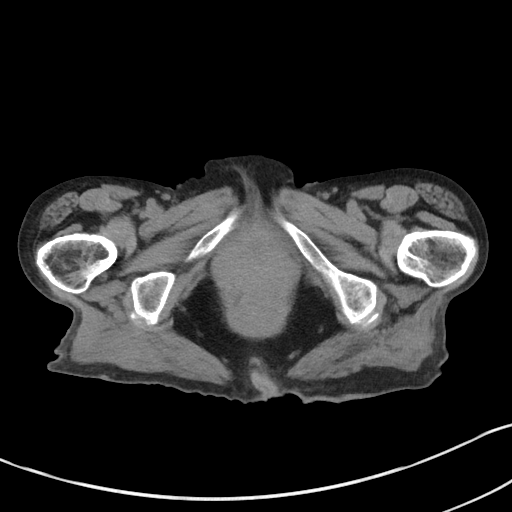
[im 17/83  soft-tissue]
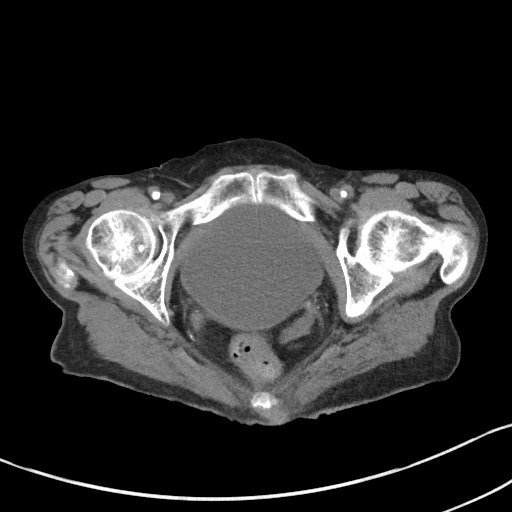
[im 28/83  soft-tissue]
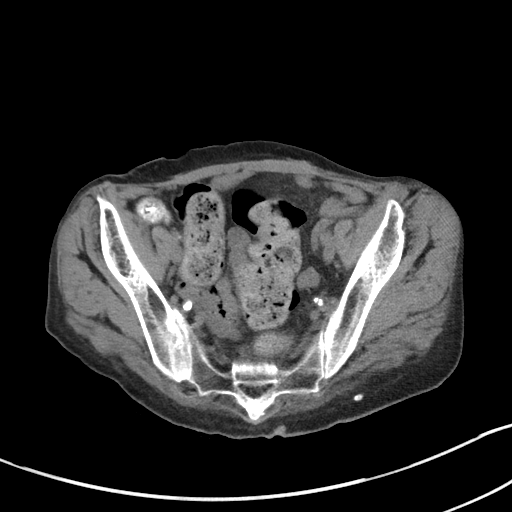
[im 33/83  soft-tissue]
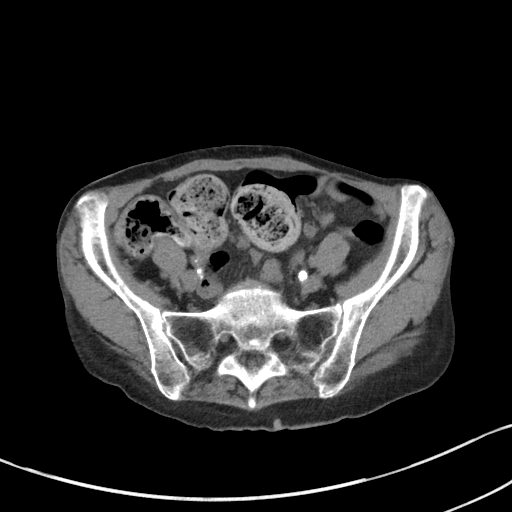
[im 39/83  soft-tissue]
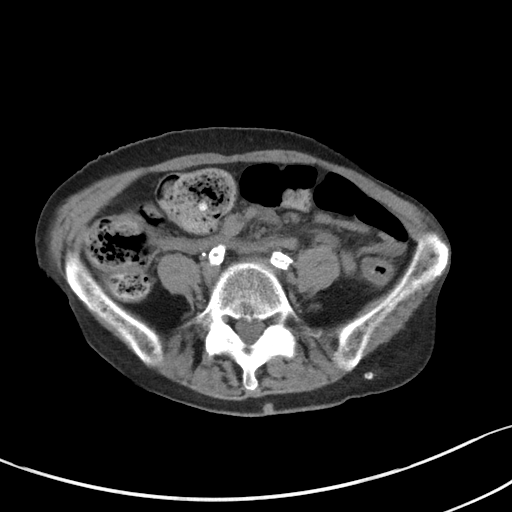
[im 44/83  soft-tissue]
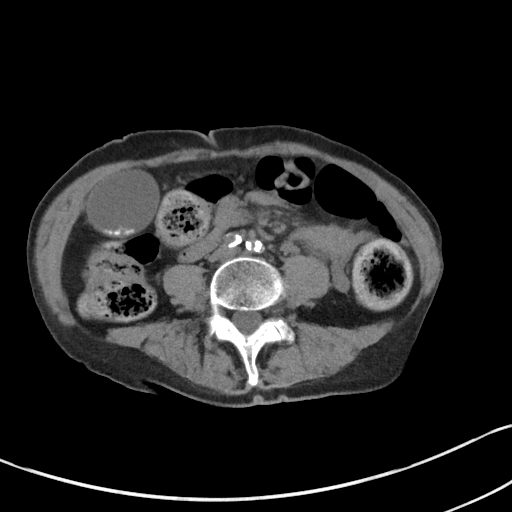
[im 50/83  soft-tissue]
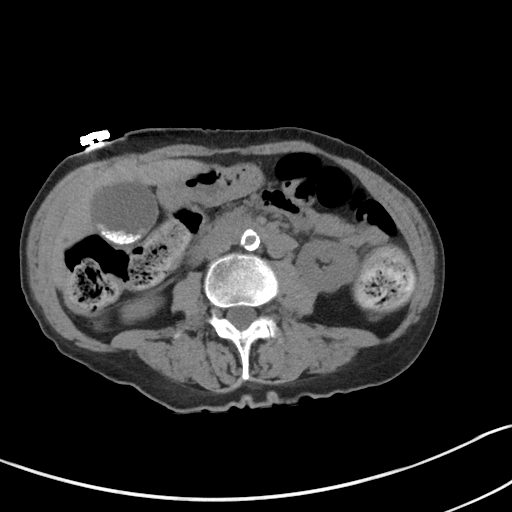
[im 55/83  soft-tissue]
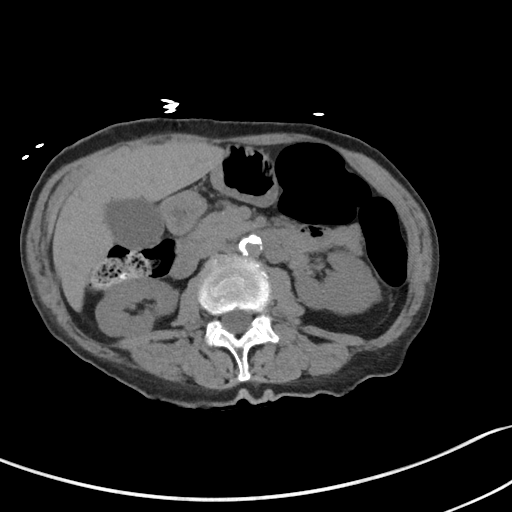
[im 55/83  bone]
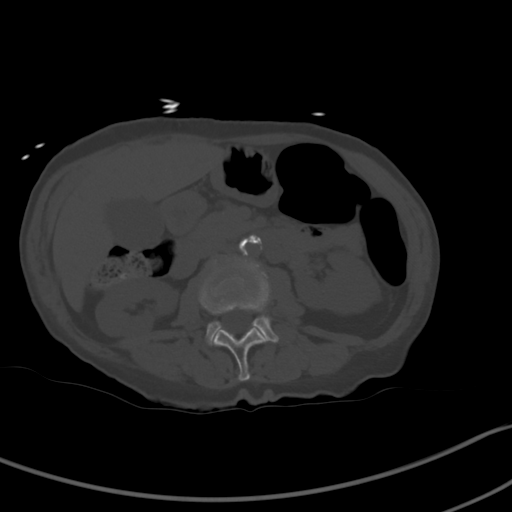
[im 66/83  soft-tissue]
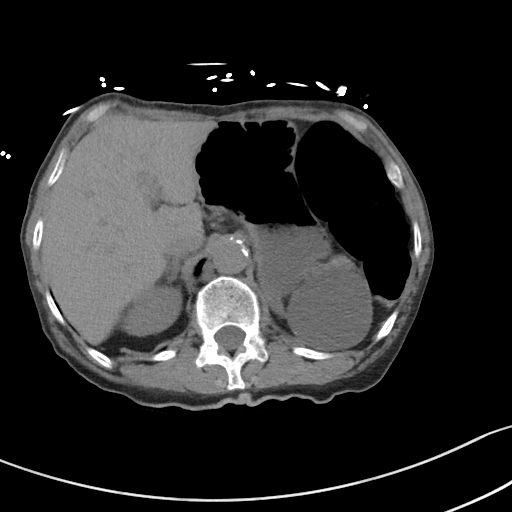
[im 72/83  soft-tissue]
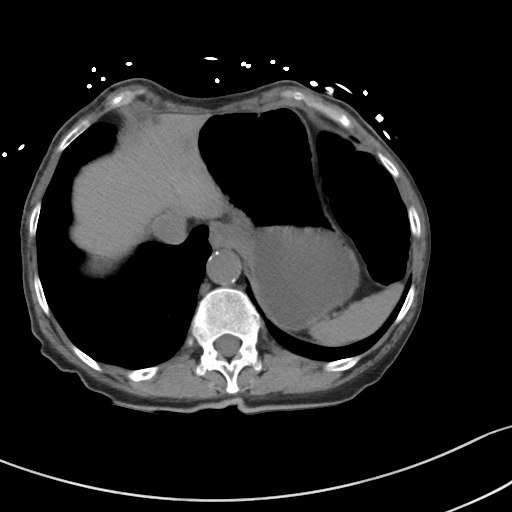
[im 77/83  soft-tissue]
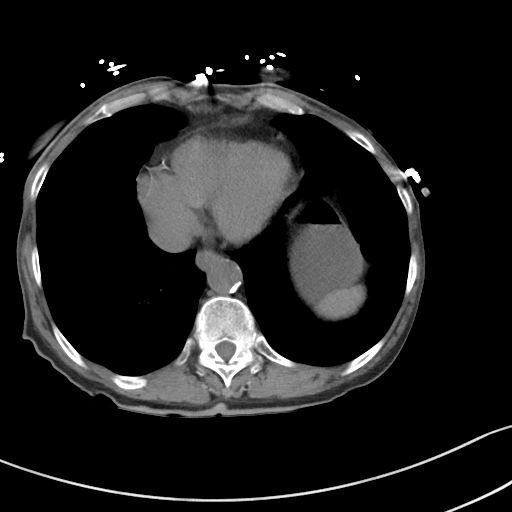

[Series 5: coronal st · coronal · 0.62mm/px · 3 of 77 slices shown]
[im 26/77  soft-tissue]
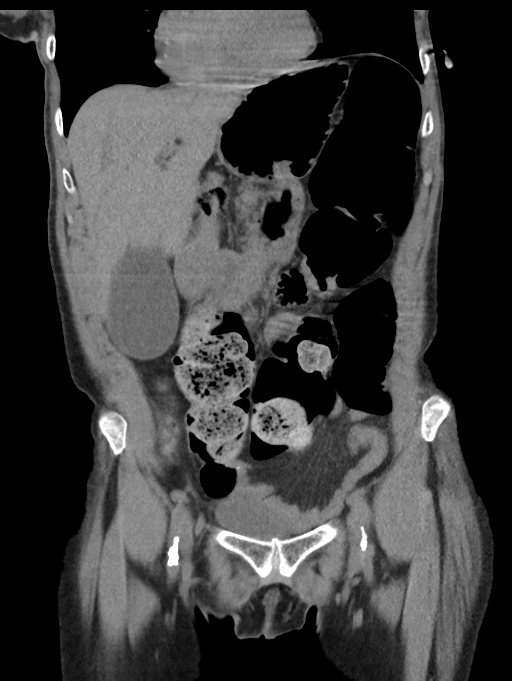
[im 34/77  soft-tissue]
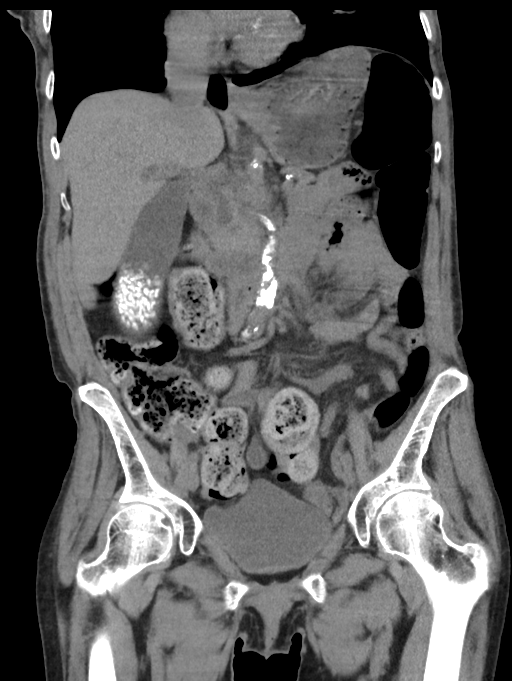
[im 43/77  soft-tissue]
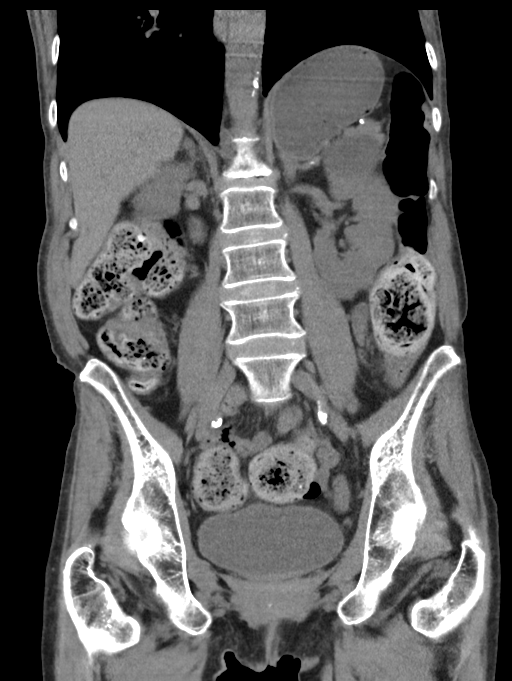

[15 of 46 positions shown; findings below may reference images not displayed]

FINDINGS: Lower chest: No acute abnormality.

Hepatobiliary: Multiple layering gallstones in the gallbladder
lumen. The gallbladder is mildly distended and without obvious
inflammation. Extrahepatic bile ducts are dilated with the common
bile duct reaching maximum diameter of approximately 9-10 mm
distally. In the distal common bile duct near the ampulla, focal
calcific density measures up to approximately 6 mm in length on the
coronal images and is consistent with at least one calculus in the
distal common bile duct. The liver appears unremarkable.

Pancreas: Unremarkable. No pancreatic ductal dilatation or
surrounding inflammatory changes.

Spleen: Normal in size without focal abnormality.

Adrenals/Urinary Tract: Adrenal glands are unremarkable. Kidneys are
normal, without renal calculi, focal lesion, or hydronephrosis.
Bladder is unremarkable.

Stomach/Bowel: Bowel shows moderate fecal material throughout the
colon without evidence of focal fecal impaction. No small bowel
dilatation. No signs of free intraperitoneal air.

Vascular/Lymphatic: Atherosclerosis of the abdominal aorta and iliac
arteries without evidence of aneurysm. No enlarged lymph nodes
identified in the abdomen or pelvis.

Reproductive: Status post hysterectomy. No adnexal masses.

Other: No abdominal wall hernia or abnormality. No abdominopelvic
ascites.

Musculoskeletal: No acute or significant osseous findings.
IMPRESSION: 1. Cholelithiasis and gallbladder distension without obvious
gallbladder inflammation. Evidence of choledocholithiasis with at
least one calculus in the distal common bile duct and associated
dilatation of the common bile duct up to a diameter of approximately
9-10 mm.
2. Moderate fecal material throughout the colon without focal fecal
impaction or evidence of overt bowel obstruction.

## 2023-09-17 IMAGING — XA DG ERCP WO/W SPHINCTEROTOMY
1 series · 7 of 7 positions shown · non-contrast
Comparison: ERCP, 10/10/2021.

CLINICAL DATA: Choledocholithiasis.

EXAM:
ERCP

[Series 1: dg or · 0.20mm/px · 2 acquisitions, 7 frames shown]
[im 1/2]
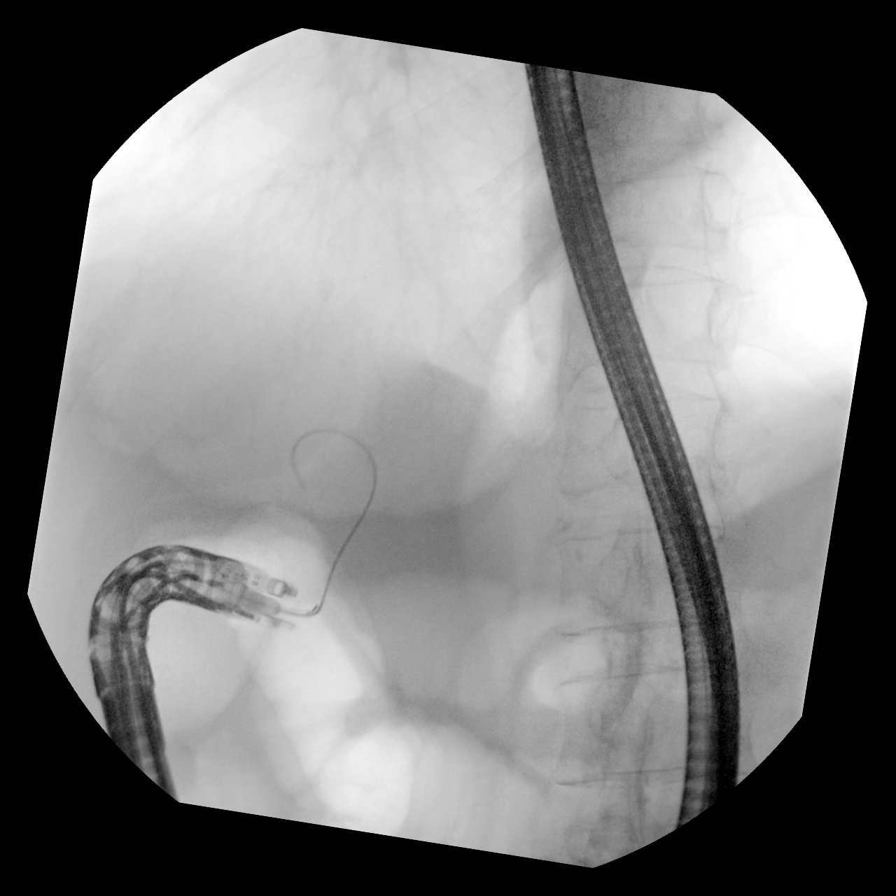
[im 1/2]
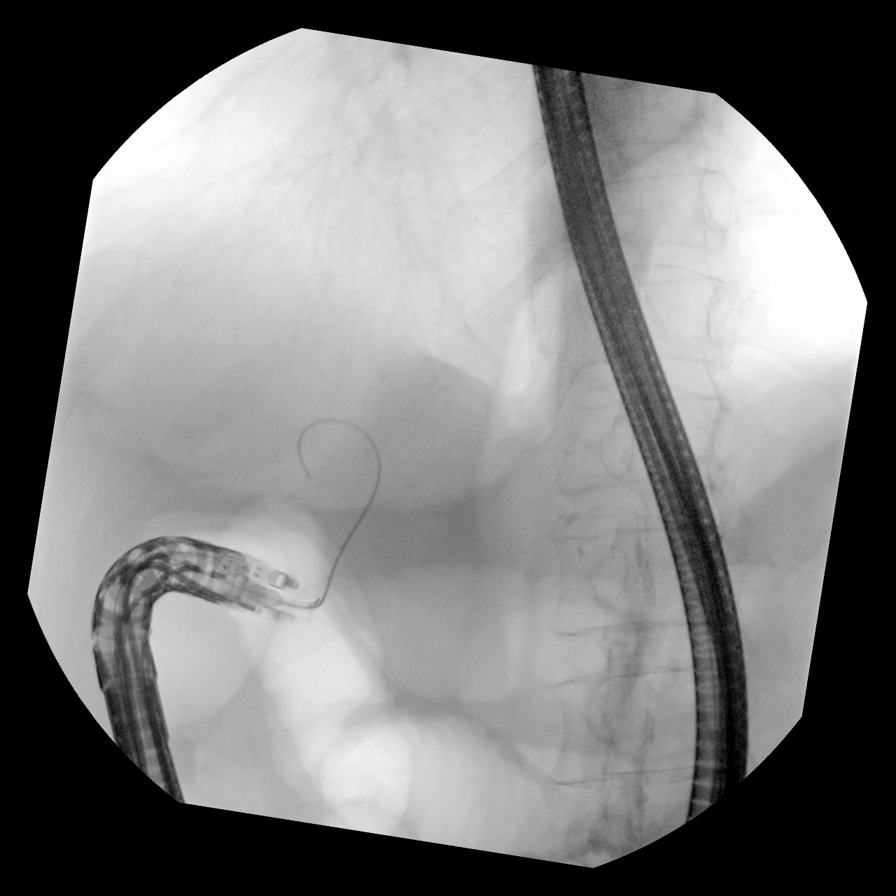
[im 1/2]
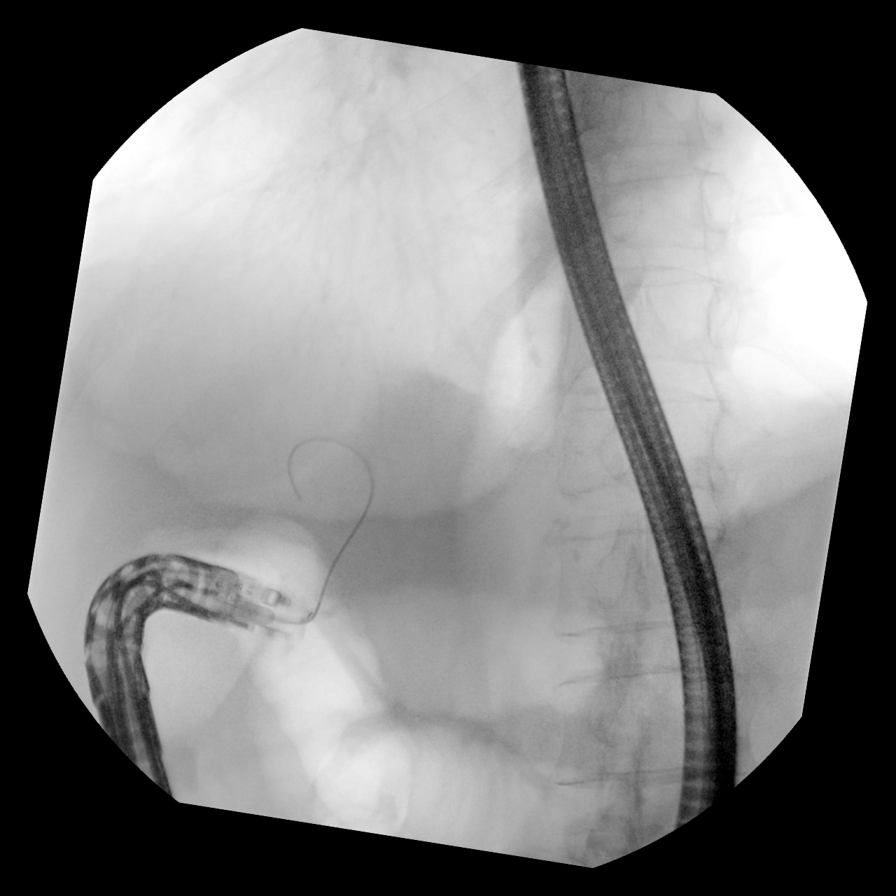
[im 1/2]
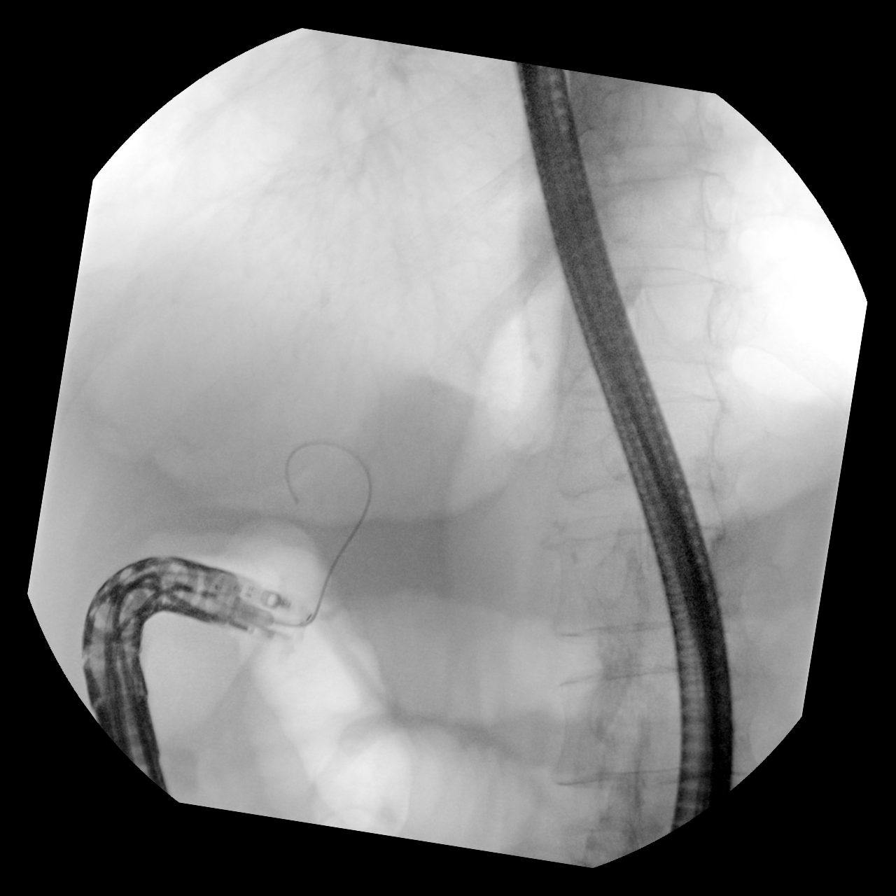
[im 2/2]
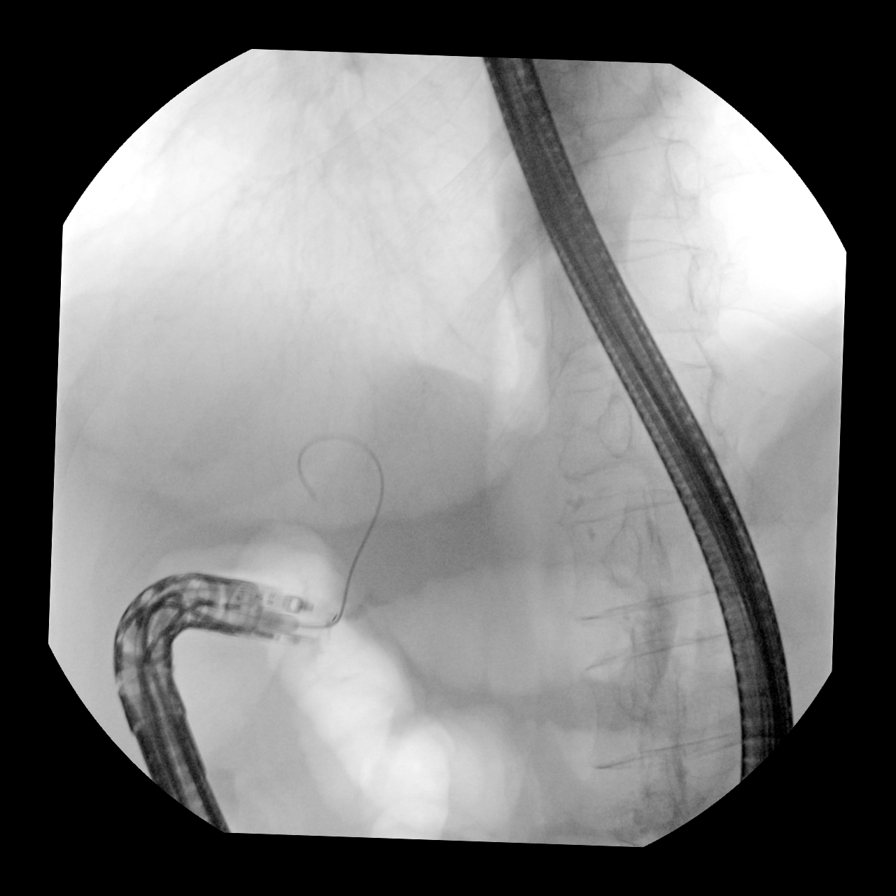
[im 2/2]
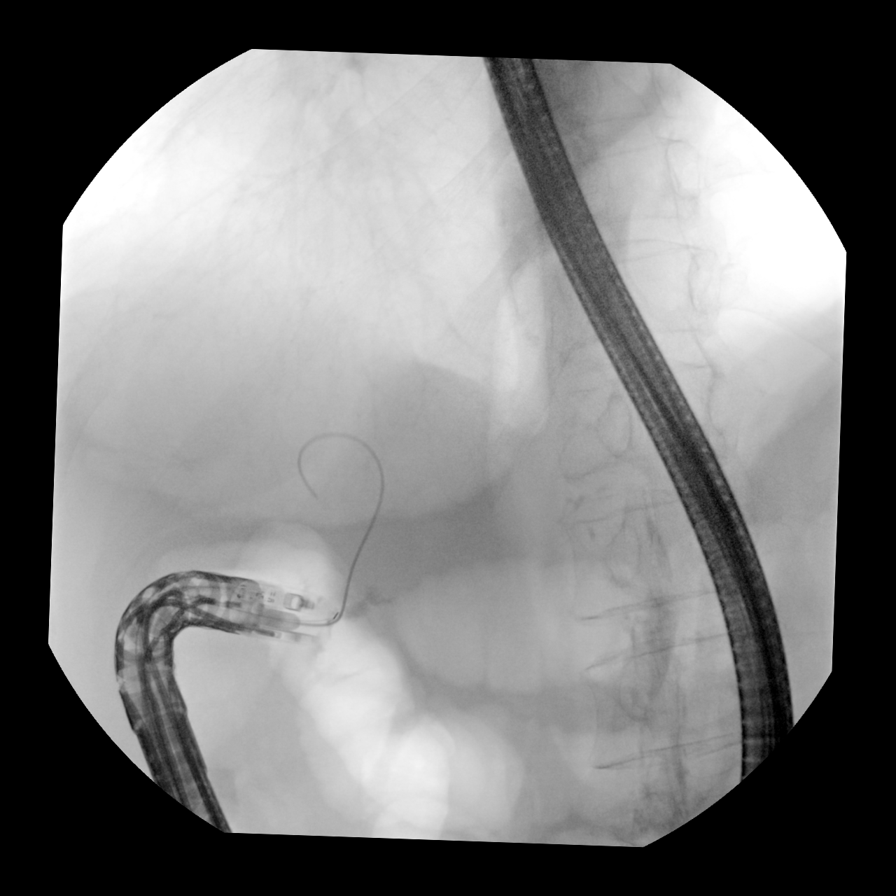
[im 2/2]
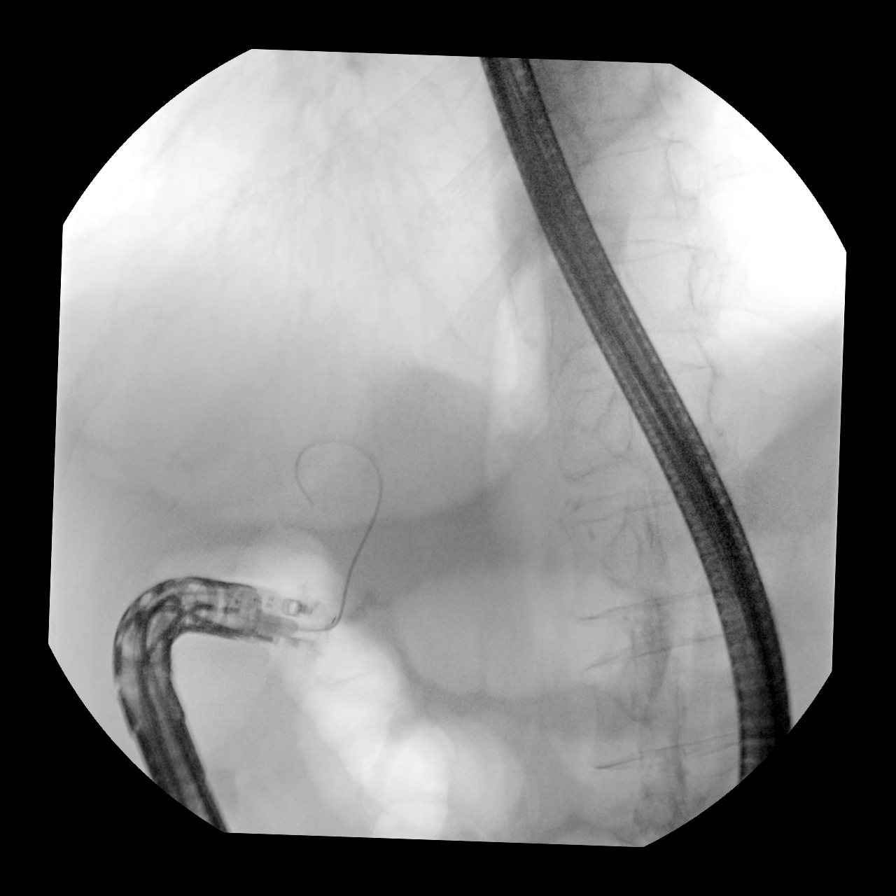

[7 of 7 positions shown; findings below may reference images not displayed]

CT AP, 10/09/2021 and 07/15/2014.

FLUOROSCOPY TIME:  Fluoroscopy Time:  2 minutes 46 seconds

Radiation Exposure Index (if provided by the fluoroscopic device):
16.7 mGy

Number of Acquired Spot Images: 2 fluoroscopic cine loops, with 19
and 6 images respectively.
FINDINGS: Multiple, limited oblique planar images of the RIGHT upper quadrant
obtained C-arm.

Images demonstrating flexible endoscopy, biliary duct cannulation,
and partial retrograde cholangiogram.
IMPRESSION: Fluoroscopic imaging for ERCP.

For complete description of intra procedural findings, please see
performing service dictation.

## 2023-10-05 ENCOUNTER — Emergency Department (HOSPITAL_COMMUNITY)
Admission: EM | Admit: 2023-10-05 | Discharge: 2023-10-05 | Disposition: A | Discharge status: Home / Self Care | Payer: Medicare Other | Attending: Emergency Medicine | Admitting: Emergency Medicine

## 2023-10-05 ENCOUNTER — Emergency Department (HOSPITAL_COMMUNITY): Payer: Medicare Other

## 2023-10-05 ENCOUNTER — Other Ambulatory Visit: Payer: Self-pay

## 2023-10-05 ENCOUNTER — Encounter (HOSPITAL_COMMUNITY): Payer: Self-pay

## 2023-10-05 DIAGNOSIS — R1900 Intra-abdominal and pelvic swelling, mass and lump, unspecified site: Secondary | ICD-10-CM | POA: Diagnosis not present

## 2023-10-05 DIAGNOSIS — D649 Anemia, unspecified: Secondary | ICD-10-CM

## 2023-10-05 DIAGNOSIS — D638 Anemia in other chronic diseases classified elsewhere: Secondary | ICD-10-CM | POA: Diagnosis not present

## 2023-10-05 DIAGNOSIS — Z8673 Personal history of transient ischemic attack (TIA), and cerebral infarction without residual deficits: Secondary | ICD-10-CM | POA: Diagnosis not present

## 2023-10-05 DIAGNOSIS — N281 Cyst of kidney, acquired: Secondary | ICD-10-CM | POA: Diagnosis not present

## 2023-10-05 DIAGNOSIS — K6812 Psoas muscle abscess: Secondary | ICD-10-CM | POA: Diagnosis not present

## 2023-10-05 DIAGNOSIS — Z7902 Long term (current) use of antithrombotics/antiplatelets: Secondary | ICD-10-CM | POA: Diagnosis not present

## 2023-10-05 DIAGNOSIS — I10 Essential (primary) hypertension: Secondary | ICD-10-CM | POA: Insufficient documentation

## 2023-10-05 DIAGNOSIS — R1909 Other intra-abdominal and pelvic swelling, mass and lump: Secondary | ICD-10-CM | POA: Diagnosis not present

## 2023-10-05 DIAGNOSIS — Z79899 Other long term (current) drug therapy: Secondary | ICD-10-CM | POA: Diagnosis not present

## 2023-10-05 DIAGNOSIS — J9610 Chronic respiratory failure, unspecified whether with hypoxia or hypercapnia: Secondary | ICD-10-CM | POA: Diagnosis not present

## 2023-10-05 LAB — COMPREHENSIVE METABOLIC PANEL
ALT: 8 U/L (ref 0–44)
AST: 14 U/L — ABNORMAL LOW (ref 15–41)
Albumin: 2.6 g/dL — ABNORMAL LOW (ref 3.5–5.0)
Alkaline Phosphatase: 185 U/L — ABNORMAL HIGH (ref 38–126)
Anion gap: 12 (ref 5–15)
BUN: 12 mg/dL (ref 8–23)
CO2: 27 mmol/L (ref 22–32)
Calcium: 7.2 mg/dL — ABNORMAL LOW (ref 8.9–10.3)
Chloride: 101 mmol/L (ref 98–111)
Creatinine, Ser: 0.98 mg/dL (ref 0.44–1.00)
GFR, Estimated: >60 mL/min (ref >60)
Glucose, Bld: 139 mg/dL — ABNORMAL HIGH (ref 70–99)
Potassium: 3.6 mmol/L (ref 3.5–5.1)
Sodium: 140 mmol/L (ref 135–145)
Total Bilirubin: 0.8 mg/dL (ref 0.0–1.2)
Total Protein: 7.5 g/dL (ref 6.5–8.1)

## 2023-10-05 LAB — CBC WITH DIFFERENTIAL/PLATELET
Abs Immature Granulocytes: 0.11 10*3/uL — ABNORMAL HIGH (ref 0.00–0.07)
Basophils Absolute: 0.1 10*3/uL (ref 0.0–0.1)
Basophils Relative: 1 %
Eosinophils Absolute: 0.2 10*3/uL (ref 0.0–0.5)
Eosinophils Relative: 1 %
HCT: 28.8 % — ABNORMAL LOW (ref 36.0–46.0)
Hemoglobin: 8.8 g/dL — ABNORMAL LOW (ref 12.0–15.0)
Immature Granulocytes: 1 %
Lymphocytes Relative: 19 %
Lymphs Abs: 2.5 10*3/uL (ref 0.7–4.0)
MCH: 32.1 pg (ref 26.0–34.0)
MCHC: 30.6 g/dL (ref 30.0–36.0)
MCV: 105.1 fL — ABNORMAL HIGH (ref 80.0–100.0)
Monocytes Absolute: 0.8 10*3/uL (ref 0.1–1.0)
Monocytes Relative: 6 %
Neutro Abs: 9.3 10*3/uL — ABNORMAL HIGH (ref 1.7–7.7)
Neutrophils Relative %: 72 %
Platelets: 457 10*3/uL — ABNORMAL HIGH (ref 150–400)
RBC: 2.74 MIL/uL — ABNORMAL LOW (ref 3.87–5.11)
RDW: 14.2 % (ref 11.5–15.5)
WBC: 12.9 10*3/uL — ABNORMAL HIGH (ref 4.0–10.5)
nRBC: 0.2 % (ref 0.0–0.2)

## 2023-10-05 NOTE — ED Triage Notes (Signed)
 Pt c/o abscess/ swelling to rt flank area. Hard edema area noted to rt side/ flank area. Pt states coming up the last week of November. Pt denies fever.

## 2023-10-05 NOTE — Discharge Instructions (Addendum)
 An outpatient MRI has been ordered for you to get more information about this mass.  Call the phone number in the morning to schedule the time to come in for this test.  You will need to follow-up with Dr. Carlette regarding the results of this MRI so he can guide you further once this mass is better diagnosed.  You may need to see a surgeon once the MRI has resulted.

## 2023-10-05 NOTE — ED Provider Notes (Signed)
 [place] EMERGENCY DEPARTMENT AT [facility] Provider Note   CSN: [id number] Arrival date & time: 10/05/23  1145     History  Chief Complaint  Patient presents with   Abscess    [name] is a 73 y.o. female with a history including MI, CVA, hypertension, GERD, hyperlipidemia presenting for evaluation of an approximate 7-week history of tender swelling noted at her right lower back/flank region. She states it was initially more tender than now but still bothers her at times with certain movements. Denies injury to the site,  denies drainage or changes in skin color.  She was seen by her pcp today and sent here, as he felt it was an abscess that needed to be drained.  The history is provided by the patient.       Home Medications Prior to Admission medications   Medication Sig Start Date End Date Taking? Authorizing Provider  acetaminophen  (TYLENOL ) 325 MG tablet Take 2 tablets (650 mg total) by mouth every 6 (six) hours as needed for mild pain or headache (or Fever >/= 101). 05/09/22  Yes Ricky Fines, MD  ascorbic acid  (VITAMIN C) 500 MG tablet Take 1 tablet (500 mg total) by mouth daily. 05/10/22  Yes Ricky Fines, MD  clopidogrel  (PLAVIX ) 75 MG tablet Take 1 tablet (75 mg total) by mouth daily. 06/17/22  Yes Dennise Lavada POUR, MD  famotidine  (PEPCID ) 20 MG tablet One after supper 09/01/23  Yes [name] Ozell NOVAK, MD  fluticasone  (FLONASE ) 50 MCG/ACT nasal spray Place 1 spray into both nostrils daily. 07/03/23  Yes [provider]  folic acid  (FOLVITE ) 1 MG tablet Take 1 tablet (1 mg total) by mouth daily. 10/17/21  Yes Tat, Alm, MD  Ipratropium-Albuterol  (COMBIVENT ) 20-100 MCG/ACT AERS respimat Inhale 1 puff into the lungs every 6 (six) hours as needed for wheezing or shortness of breath. 05/09/22  Yes Ricky Fines, MD  metoprolol  succinate (TOPROL  XL) 50 MG 24 hr tablet Take 1 tablet (50 mg total) by mouth daily. Take with or immediately following a  meal. 03/30/23  Yes Mallipeddi, Vishnu P, MD  mirtazapine  (REMERON ) 7.5 MG tablet Take 7.5 mg by mouth at bedtime. 04/27/22  Yes [provider]  montelukast  (SINGULAIR ) 10 MG tablet Take 10 mg by mouth at bedtime.   Yes [provider]  pantoprazole  (PROTONIX ) 40 MG tablet Take 40 mg by mouth daily.   Yes [provider]  Tiotropium Bromide-Olodaterol (STIOLTO RESPIMAT ) 2.5-2.5 MCG/ACT AERS Inhale 2 puffs into the lungs daily. Patient taking differently: Inhale 2 puffs into the lungs every morning. 10/01/22  Yes [name] Ozell NOVAK, MD  torsemide  (DEMADEX ) 20 MG tablet Take 40 mg by mouth 2 (two) times daily.   Yes [provider]  vitamin B-12 (CYANOCOBALAMIN ) 500 MCG tablet Take 1 tablet (500 mcg total) by mouth daily. 10/16/21  Yes Tat, Alm, MD  omeprazole  (PRILOSEC) 40 MG capsule Take 40 mg by mouth daily.    [provider]      Allergies    Aspirin , Bee venom, Contrast media [iodinated contrast media], Lisinopril, Motrin [ibuprofen], Penicillins, and Dilaudid  [hydromorphone  hcl]    Review of Systems   Review of Systems  Constitutional:  Negative for chills and fever.  HENT:  Negative for congestion.   Eyes: Negative.   Respiratory:  Negative for chest tightness and shortness of breath.   Cardiovascular:  Negative for chest pain.  Gastrointestinal:  Negative for abdominal pain, nausea and vomiting.  Genitourinary: Negative.  Musculoskeletal:  Negative for arthralgias, joint swelling and neck pain.  Skin: Negative.  Negative for rash and wound.  Neurological:  Negative for dizziness, weakness, light-headedness, numbness and headaches.  Psychiatric/Behavioral: Negative.      Physical Exam Updated Vital Signs BP 128/88 (BP Location: Right Arm)   Pulse 86   Temp 98.1 F (36.7 C) (Oral)   Resp 18   Ht 5' (1.524 m)   Wt 63.5 kg   SpO2 99%   BMI 27.34 kg/m  Physical Exam Vitals and nursing note reviewed.  Constitutional:       Appearance: She is well-developed.  HENT:     Head: Normocephalic and atraumatic.  Eyes:     Conjunctiva/sclera: Conjunctivae normal.  Cardiovascular:     Rate and Rhythm: Normal rate and regular rhythm.     Heart sounds: Normal heart sounds.  Pulmonary:     Effort: Pulmonary effort is normal.     Breath sounds: Normal breath sounds. No wheezing.  Abdominal:     General: Bowel sounds are normal.     Palpations: Abdomen is soft.     Tenderness: There is no abdominal tenderness.  Musculoskeletal:        General: Normal range of motion.     Cervical back: Normal range of motion.  Skin:    General: Skin is warm and dry.     Comments: Mass in photo,  right flank, measures 16 x 7 cm.  Firm,  nodular.   Neurological:     Mental Status: She is alert.     ED Results / Procedures / Treatments   Labs (all labs ordered are listed, but only abnormal results are displayed) Labs Reviewed  CBC WITH DIFFERENTIAL/PLATELET - Abnormal; Notable for the following components:      Result Value   WBC 12.9 (*)    RBC 2.74 (*)    Hemoglobin 8.8 (*)    HCT 28.8 (*)    MCV 105.1 (*)    Platelets 457 (*)    Neutro Abs 9.3 (*)    Abs Immature Granulocytes 0.11 (*)    All other components within normal limits  COMPREHENSIVE METABOLIC PANEL - Abnormal; Notable for the following components:   Glucose, Bld 139 (*)    Calcium  7.2 (*)    Albumin 2.6 (*)    AST 14 (*)    Alkaline Phosphatase 185 (*)    All other components within normal limits    EKG None  Radiology CT ABDOMEN PELVIS WO CONTRAST Result Date: 10/05/2023 CLINICAL DATA:  Palpable mass right low back/flank EXAM: CT ABDOMEN AND PELVIS WITHOUT CONTRAST TECHNIQUE: Multidetector CT imaging of the abdomen and pelvis was performed following the standard protocol without IV contrast. RADIATION DOSE REDUCTION: This exam was performed according to the departmental dose-optimization program which includes automated exposure control, adjustment  of the mA and/or kV according to patient size and/or use of iterative reconstruction technique. COMPARISON:  CT 05/02/2022 FINDINGS: Lower chest: Lung bases demonstrate emphysema. No acute airspace disease. Coronary vascular calcification. Asymmetric row right subareolar breast density on series 2, image 4. Hepatobiliary: Stable hypodensity in the left hepatic lobe, previously characterized by MRI. Cholecystectomy. No biliary dilatation Pancreas: Unremarkable. No pancreatic ductal dilatation or surrounding inflammatory changes. Spleen: Normal in size without focal abnormality. Adrenals/Urinary Tract: Adrenal glands are normal. Kidneys show no hydronephrosis. Left renal cysts for which no imaging follow-up is recommended. The bladder is normal Stomach/Bowel: The stomach is nonenlarged. There is no dilated small bowel. No  acute bowel wall thickening Vascular/Lymphatic: Advanced aortic atherosclerosis. No aneurysm. No suspicious lymph nodes. Reproductive: Hysterectomy.  No adnexal mass Other: Negative for pelvic effusion or free air. Small indeterminate soft tissue nodule measuring 14 mm on series 2, image 37, adjacent to the ascending colon. Musculoskeletal: No acute fracture is seen. Right flank heterogenous collection containing fluid and some fat density. This measures roughly 4.6 cm AP by 11.4 cm oblique craniocaudal by 10.7 cm transverse, and extends to the skin surface where there is overlying skin thickening. This is contiguous with irregular density extending into the right retroperitoneum and contiguous with irregular collection abutting the right psoas and iliacus muscles. Collection or mass abutting the right psoas also has some internal fat density. IMPRESSION: 1. Right flank heterogenous collection containing fluid and some fat density measuring up to 11.4 cm, extends to the skin surface and is contiguous with irregular density extending into the right retroperitoneum with additional irregular collection  or mass abutting the right psoas and iliacus muscles. Differential considerations include organizing hematoma from prior trauma, possibly with component of fat necrosis. Underlying soft tissue neoplasm complicated by hemorrhage is also a consideration as patient gives no history of trauma to the area. Further evaluation with MRI with and without contrast is suggested for further assessment. 2. Small indeterminate soft tissue nodule adjacent to the ascending colon measuring 14 mm. The irregular appearance is somewhat suspicious for a metastatic focus. 3. Asymmetric right subareolar breast density, recommend correlation with outpatient mammography and possible ultrasound. 4. Aortic atherosclerosis. Aortic Atherosclerosis (ICD10-I70.0) and Emphysema (ICD10-J43.9). Electronically Signed   By: Luke Bun M.D.   On: 10/05/2023 16:38    Procedures Procedures    Medications Ordered in ED Medications - No data to display  ED Course/ Medical Decision Making/ A&P                                 Medical Decision Making Pt with a 7 week history of right back/flank mass of unclear etiology.  Duration and history does not suggest abscess, more concerning for subc or trunkal mass.  CT imaging and labs obtained with below results.  Discussed with patient including the possible diagnoses but that MRI imaging is being recommended for further information.  She defers this test today, stating she has been here all day and just wants to go home.  She agrees to follow up with outpatient test if ordered and will f/u with pcp for further guidance.  This plans seems appropriate with no obvious emergent findings tonight.    Amount and/or Complexity of Data Reviewed Labs: ordered.    Details: Reviewed,  milk leukocytosis, no hx or exam findings suggesting infectious source.  Chronic anemia which was discussed with pt.  Radiology: ordered.    Details: CT revealing mass right intraabdominal extending into the  retroperitoneum, hematoma vs neoplasm. Nodule adjacent to ascending colon ? Metastatic focus, right breast density.   Discussion of management or test interpretation with external provider(s): Results discussed with Dr. Kallie with gen surg with nothing more to offer pending mri.  If results suggest neoplasm she will need surgical oncology for ongoing tx.            Final Clinical Impression(s) / ED Diagnoses Final diagnoses:  Abdominal wall mass of right flank  Chronic anemia    Rx / DC Orders ED Discharge Orders          Ordered  MR ABDOMEN WO CONTRAST        10/05/23 1827              Birdena Clarity, DEVONNA 10/06/23 1347    Dean Clarity, MD 10/06/23 1700

## 2023-10-05 NOTE — ED Provider Triage Note (Signed)
 Emergency Medicine Provider Triage Evaluation Note  [name] , a 73 y.o. female  was evaluated in triage.  Pt complains of pain and localized swelling of her right flank area.  Symptoms present for 1 month without improvement.  Area is painful to touch and worsens with movement, no redness or drainage reported.  No trauma to the area.  Seen by PCP earlier today and advised to come to the emergency department for further evaluation.  She denies any fever, chills, abdominal pain nausea or vomiting.  No dysuria symptoms..  Review of Systems  Positive: Swelling tenderness lateral right chest wall/flank Negative: Fever, chills, drainage, redness, abdominal pain  Physical Exam  BP (!) 112/57 (BP Location: Right Arm)   Pulse 90   Temp 98.3 F (36.8 C) (Oral)   Resp 20   Ht 5' (1.524 m)   Wt 63.5 kg   SpO2 100%   BMI 27.34 kg/m  Gen:   Awake, no distress   Resp:  Normal effort  MSK:   Moves extremities without difficulty     Medical Decision Making  Medically screening exam initiated at 12:16 PM.  Appropriate orders placed.  [name] was informed that the remainder of the evaluation will be completed by another provider, this initial triage assessment does not replace that evaluation, and the importance of remaining in the ED until their evaluation is complete.     Herlinda Milling, PA-C 10/05/23 1217

## 2023-10-06 ENCOUNTER — Other Ambulatory Visit: Payer: Self-pay | Admitting: Internal Medicine

## 2023-10-10 ENCOUNTER — Ambulatory Visit (HOSPITAL_COMMUNITY)
Admission: RE | Admit: 2023-10-10 | Discharge: 2023-10-10 | Disposition: A | Discharge status: Home / Self Care | Payer: Medicare Other | Source: Ambulatory Visit | Attending: Emergency Medicine | Admitting: Emergency Medicine

## 2023-10-10 DIAGNOSIS — G8929 Other chronic pain: Secondary | ICD-10-CM | POA: Diagnosis not present

## 2023-10-10 DIAGNOSIS — N179 Acute kidney failure, unspecified: Secondary | ICD-10-CM | POA: Diagnosis not present

## 2023-10-10 DIAGNOSIS — K625 Hemorrhage of anus and rectum: Secondary | ICD-10-CM | POA: Diagnosis not present

## 2023-10-10 DIAGNOSIS — K648 Other hemorrhoids: Secondary | ICD-10-CM | POA: Diagnosis not present

## 2023-10-10 DIAGNOSIS — I700 Atherosclerosis of aorta: Secondary | ICD-10-CM | POA: Insufficient documentation

## 2023-10-10 DIAGNOSIS — I110 Hypertensive heart disease with heart failure: Secondary | ICD-10-CM | POA: Diagnosis not present

## 2023-10-10 DIAGNOSIS — Z9049 Acquired absence of other specified parts of digestive tract: Secondary | ICD-10-CM | POA: Insufficient documentation

## 2023-10-10 DIAGNOSIS — E876 Hypokalemia: Secondary | ICD-10-CM | POA: Diagnosis not present

## 2023-10-10 DIAGNOSIS — D72829 Elevated white blood cell count, unspecified: Secondary | ICD-10-CM | POA: Diagnosis not present

## 2023-10-10 DIAGNOSIS — I517 Cardiomegaly: Secondary | ICD-10-CM | POA: Insufficient documentation

## 2023-10-10 DIAGNOSIS — K6819 Other retroperitoneal abscess: Secondary | ICD-10-CM | POA: Diagnosis not present

## 2023-10-10 DIAGNOSIS — K381 Appendicular concretions: Secondary | ICD-10-CM | POA: Diagnosis not present

## 2023-10-10 DIAGNOSIS — I5022 Chronic systolic (congestive) heart failure: Secondary | ICD-10-CM | POA: Diagnosis not present

## 2023-10-10 DIAGNOSIS — R109 Unspecified abdominal pain: Secondary | ICD-10-CM | POA: Diagnosis not present

## 2023-10-10 DIAGNOSIS — R1031 Right lower quadrant pain: Secondary | ICD-10-CM | POA: Diagnosis not present

## 2023-10-10 DIAGNOSIS — N289 Disorder of kidney and ureter, unspecified: Secondary | ICD-10-CM | POA: Diagnosis not present

## 2023-10-10 DIAGNOSIS — R1900 Intra-abdominal and pelvic swelling, mass and lump, unspecified site: Secondary | ICD-10-CM | POA: Diagnosis not present

## 2023-10-10 DIAGNOSIS — J449 Chronic obstructive pulmonary disease, unspecified: Secondary | ICD-10-CM | POA: Diagnosis not present

## 2023-10-10 DIAGNOSIS — D75839 Thrombocytosis, unspecified: Secondary | ICD-10-CM | POA: Diagnosis not present

## 2023-10-10 DIAGNOSIS — K219 Gastro-esophageal reflux disease without esophagitis: Secondary | ICD-10-CM | POA: Diagnosis not present

## 2023-10-10 DIAGNOSIS — K8020 Calculus of gallbladder without cholecystitis without obstruction: Secondary | ICD-10-CM | POA: Diagnosis not present

## 2023-10-10 DIAGNOSIS — Z9981 Dependence on supplemental oxygen: Secondary | ICD-10-CM | POA: Diagnosis not present

## 2023-10-10 DIAGNOSIS — D62 Acute posthemorrhagic anemia: Secondary | ICD-10-CM | POA: Diagnosis not present

## 2023-10-10 DIAGNOSIS — E785 Hyperlipidemia, unspecified: Secondary | ICD-10-CM | POA: Diagnosis not present

## 2023-10-10 DIAGNOSIS — J9611 Chronic respiratory failure with hypoxia: Secondary | ICD-10-CM | POA: Diagnosis not present

## 2023-10-10 DIAGNOSIS — K6812 Psoas muscle abscess: Secondary | ICD-10-CM | POA: Diagnosis not present

## 2023-10-10 DIAGNOSIS — A419 Sepsis, unspecified organism: Secondary | ICD-10-CM | POA: Diagnosis not present

## 2023-10-10 MED ORDER — GADOBUTROL 1 MMOL/ML IV SOLN
6.3000 mL | Freq: Once | INTRAVENOUS | Status: AC | PRN
  Administered 2023-10-10: 6.3 mL via INTRAVENOUS

## 2023-10-13 ENCOUNTER — Inpatient Hospital Stay (HOSPITAL_COMMUNITY)
Admission: EM | Admit: 2023-10-13 | Discharge: 2023-10-19 | DRG: 871 | Disposition: A | Discharge status: Home / Self Care | Payer: Medicare Other | Attending: Internal Medicine | Admitting: Internal Medicine

## 2023-10-13 ENCOUNTER — Encounter (HOSPITAL_COMMUNITY): Payer: Self-pay

## 2023-10-13 ENCOUNTER — Other Ambulatory Visit: Payer: Self-pay

## 2023-10-13 DIAGNOSIS — Z90710 Acquired absence of both cervix and uterus: Secondary | ICD-10-CM

## 2023-10-13 DIAGNOSIS — Z91041 Radiographic dye allergy status: Secondary | ICD-10-CM

## 2023-10-13 DIAGNOSIS — K381 Appendicular concretions: Secondary | ICD-10-CM | POA: Diagnosis present

## 2023-10-13 DIAGNOSIS — Z9049 Acquired absence of other specified parts of digestive tract: Secondary | ICD-10-CM

## 2023-10-13 DIAGNOSIS — I700 Atherosclerosis of aorta: Secondary | ICD-10-CM | POA: Diagnosis present

## 2023-10-13 DIAGNOSIS — J449 Chronic obstructive pulmonary disease, unspecified: Secondary | ICD-10-CM | POA: Diagnosis present

## 2023-10-13 DIAGNOSIS — N179 Acute kidney failure, unspecified: Secondary | ICD-10-CM | POA: Diagnosis not present

## 2023-10-13 DIAGNOSIS — E876 Hypokalemia: Secondary | ICD-10-CM | POA: Diagnosis present

## 2023-10-13 DIAGNOSIS — Z90722 Acquired absence of ovaries, bilateral: Secondary | ICD-10-CM

## 2023-10-13 DIAGNOSIS — K219 Gastro-esophageal reflux disease without esophagitis: Secondary | ICD-10-CM | POA: Diagnosis present

## 2023-10-13 DIAGNOSIS — K6812 Psoas muscle abscess: Secondary | ICD-10-CM | POA: Diagnosis not present

## 2023-10-13 DIAGNOSIS — K648 Other hemorrhoids: Secondary | ICD-10-CM | POA: Diagnosis present

## 2023-10-13 DIAGNOSIS — Z820 Family history of epilepsy and other diseases of the nervous system: Secondary | ICD-10-CM

## 2023-10-13 DIAGNOSIS — Z886 Allergy status to analgesic agent status: Secondary | ICD-10-CM

## 2023-10-13 DIAGNOSIS — D75839 Thrombocytosis, unspecified: Secondary | ICD-10-CM | POA: Diagnosis present

## 2023-10-13 DIAGNOSIS — Z8249 Family history of ischemic heart disease and other diseases of the circulatory system: Secondary | ICD-10-CM

## 2023-10-13 DIAGNOSIS — I110 Hypertensive heart disease with heart failure: Secondary | ICD-10-CM | POA: Diagnosis present

## 2023-10-13 DIAGNOSIS — I5022 Chronic systolic (congestive) heart failure: Secondary | ICD-10-CM | POA: Diagnosis present

## 2023-10-13 DIAGNOSIS — D62 Acute posthemorrhagic anemia: Secondary | ICD-10-CM | POA: Diagnosis not present

## 2023-10-13 DIAGNOSIS — A419 Sepsis, unspecified organism: Secondary | ICD-10-CM | POA: Diagnosis not present

## 2023-10-13 DIAGNOSIS — J439 Emphysema, unspecified: Secondary | ICD-10-CM | POA: Diagnosis not present

## 2023-10-13 DIAGNOSIS — I10 Essential (primary) hypertension: Secondary | ICD-10-CM | POA: Diagnosis present

## 2023-10-13 DIAGNOSIS — Z885 Allergy status to narcotic agent status: Secondary | ICD-10-CM

## 2023-10-13 DIAGNOSIS — Z833 Family history of diabetes mellitus: Secondary | ICD-10-CM

## 2023-10-13 DIAGNOSIS — I252 Old myocardial infarction: Secondary | ICD-10-CM

## 2023-10-13 DIAGNOSIS — E785 Hyperlipidemia, unspecified: Secondary | ICD-10-CM | POA: Diagnosis present

## 2023-10-13 DIAGNOSIS — K8020 Calculus of gallbladder without cholecystitis without obstruction: Secondary | ICD-10-CM | POA: Diagnosis present

## 2023-10-13 DIAGNOSIS — H5461 Unqualified visual loss, right eye, normal vision left eye: Secondary | ICD-10-CM | POA: Diagnosis present

## 2023-10-13 DIAGNOSIS — K6819 Other retroperitoneal abscess: Secondary | ICD-10-CM | POA: Diagnosis present

## 2023-10-13 DIAGNOSIS — Z9981 Dependence on supplemental oxygen: Secondary | ICD-10-CM | POA: Diagnosis not present

## 2023-10-13 DIAGNOSIS — Z9889 Other specified postprocedural states: Secondary | ICD-10-CM

## 2023-10-13 DIAGNOSIS — R1031 Right lower quadrant pain: Secondary | ICD-10-CM | POA: Diagnosis not present

## 2023-10-13 DIAGNOSIS — Z91030 Bee allergy status: Secondary | ICD-10-CM

## 2023-10-13 DIAGNOSIS — Z880 Allergy status to penicillin: Secondary | ICD-10-CM

## 2023-10-13 DIAGNOSIS — J9611 Chronic respiratory failure with hypoxia: Secondary | ICD-10-CM | POA: Diagnosis not present

## 2023-10-13 DIAGNOSIS — K5900 Constipation, unspecified: Secondary | ICD-10-CM | POA: Diagnosis not present

## 2023-10-13 DIAGNOSIS — R109 Unspecified abdominal pain: Secondary | ICD-10-CM | POA: Diagnosis present

## 2023-10-13 DIAGNOSIS — Z8673 Personal history of transient ischemic attack (TIA), and cerebral infarction without residual deficits: Secondary | ICD-10-CM

## 2023-10-13 DIAGNOSIS — R932 Abnormal findings on diagnostic imaging of liver and biliary tract: Secondary | ICD-10-CM | POA: Diagnosis not present

## 2023-10-13 DIAGNOSIS — Z955 Presence of coronary angioplasty implant and graft: Secondary | ICD-10-CM

## 2023-10-13 DIAGNOSIS — D72829 Elevated white blood cell count, unspecified: Secondary | ICD-10-CM | POA: Diagnosis present

## 2023-10-13 DIAGNOSIS — K625 Hemorrhage of anus and rectum: Secondary | ICD-10-CM | POA: Diagnosis not present

## 2023-10-13 DIAGNOSIS — Z8719 Personal history of other diseases of the digestive system: Secondary | ICD-10-CM

## 2023-10-13 DIAGNOSIS — Z7902 Long term (current) use of antithrombotics/antiplatelets: Secondary | ICD-10-CM

## 2023-10-13 DIAGNOSIS — Z79899 Other long term (current) drug therapy: Secondary | ICD-10-CM

## 2023-10-13 DIAGNOSIS — Z888 Allergy status to other drugs, medicaments and biological substances status: Secondary | ICD-10-CM

## 2023-10-13 DIAGNOSIS — R0600 Dyspnea, unspecified: Secondary | ICD-10-CM | POA: Diagnosis not present

## 2023-10-13 DIAGNOSIS — G8929 Other chronic pain: Secondary | ICD-10-CM | POA: Diagnosis present

## 2023-10-13 DIAGNOSIS — Z87891 Personal history of nicotine dependence: Secondary | ICD-10-CM

## 2023-10-13 LAB — CBC
HCT: 32.4 % — ABNORMAL LOW (ref 36.0–46.0)
Hemoglobin: 9.8 g/dL — ABNORMAL LOW (ref 12.0–15.0)
MCH: 32 pg (ref 26.0–34.0)
MCHC: 30.2 g/dL (ref 30.0–36.0)
MCV: 105.9 fL — ABNORMAL HIGH (ref 80.0–100.0)
Platelets: 504 10*3/uL — ABNORMAL HIGH (ref 150–400)
RBC: 3.06 MIL/uL — ABNORMAL LOW (ref 3.87–5.11)
RDW: 14.2 % (ref 11.5–15.5)
WBC: 11.4 10*3/uL — ABNORMAL HIGH (ref 4.0–10.5)
nRBC: 0 % (ref 0.0–0.2)

## 2023-10-13 LAB — BASIC METABOLIC PANEL
Anion gap: 14 (ref 5–15)
BUN: 15 mg/dL (ref 8–23)
CO2: 28 mmol/L (ref 22–32)
Calcium: 7.8 mg/dL — ABNORMAL LOW (ref 8.9–10.3)
Chloride: 99 mmol/L (ref 98–111)
Creatinine, Ser: 0.92 mg/dL (ref 0.44–1.00)
GFR, Estimated: >60 mL/min (ref >60)
Glucose, Bld: 176 mg/dL — ABNORMAL HIGH (ref 70–99)
Potassium: 3.4 mmol/L — ABNORMAL LOW (ref 3.5–5.1)
Sodium: 141 mmol/L (ref 135–145)

## 2023-10-13 LAB — LACTIC ACID, PLASMA: Lactic Acid, Venous: 2.1 mmol/L (ref 0.5–1.9)

## 2023-10-13 LAB — MAGNESIUM: Magnesium: 1 mg/dL — ABNORMAL LOW (ref 1.7–2.4)

## 2023-10-13 MED ORDER — VANCOMYCIN HCL 750 MG/150ML IV SOLN
750.0000 mg | INTRAVENOUS | Status: DC
  Administered 2023-10-14: 750 mg via INTRAVENOUS
  Filled 2023-10-13: qty 150

## 2023-10-13 MED ORDER — MORPHINE SULFATE (PF) 2 MG/ML IV SOLN
2.0000 mg | INTRAVENOUS | Status: DC | PRN
  Administered 2023-10-14 – 2023-10-18 (×11): 2 mg via INTRAVENOUS
  Filled 2023-10-13 (×11): qty 1

## 2023-10-13 MED ORDER — ACETAMINOPHEN 650 MG RE SUPP: 650.0000 mg | Freq: Four times a day (QID) | RECTAL | Status: DC | PRN

## 2023-10-13 MED ORDER — ONDANSETRON HCL 4 MG/2ML IJ SOLN: 4.0000 mg | Freq: Four times a day (QID) | INTRAMUSCULAR | Status: DC | PRN

## 2023-10-13 MED ORDER — IPRATROPIUM-ALBUTEROL 20-100 MCG/ACT IN AERS: 1.0000 | INHALATION_SPRAY | Freq: Four times a day (QID) | RESPIRATORY_TRACT | Status: DC | PRN

## 2023-10-13 MED ORDER — ONDANSETRON HCL 4 MG PO TABS: 4.0000 mg | ORAL_TABLET | Freq: Four times a day (QID) | ORAL | Status: DC | PRN

## 2023-10-13 MED ORDER — SODIUM CHLORIDE 0.9 % IV SOLN
2.0000 g | Freq: Once | INTRAVENOUS | Status: AC
  Administered 2023-10-13: 2 g via INTRAVENOUS
  Filled 2023-10-13: qty 10

## 2023-10-13 MED ORDER — MIRTAZAPINE 15 MG PO TABS
7.5000 mg | ORAL_TABLET | Freq: Every day | ORAL | Status: DC
Start: 2023-10-13 — End: 2023-10-19
  Administered 2023-10-14 – 2023-10-18 (×6): 7.5 mg via ORAL
  Filled 2023-10-13 (×6): qty 1

## 2023-10-13 MED ORDER — AZTREONAM 2 G IJ SOLR
2.0000 g | Freq: Three times a day (TID) | INTRAMUSCULAR | Status: DC
  Administered 2023-10-14 (×2): 2 g via INTRAVENOUS
  Filled 2023-10-13 (×5): qty 10

## 2023-10-13 MED ORDER — LACTATED RINGERS IV BOLUS
500.0000 mL | Freq: Once | INTRAVENOUS | Status: AC
  Administered 2023-10-14: 500 mL via INTRAVENOUS

## 2023-10-13 MED ORDER — SODIUM CHLORIDE 0.9 % IV SOLN: INTRAVENOUS | Status: AC

## 2023-10-13 MED ORDER — POTASSIUM CHLORIDE CRYS ER 20 MEQ PO TBCR
40.0000 meq | EXTENDED_RELEASE_TABLET | ORAL | Status: AC
  Administered 2023-10-13 – 2023-10-14 (×2): 40 meq via ORAL
  Filled 2023-10-13: qty 2

## 2023-10-13 MED ORDER — POLYETHYLENE GLYCOL 3350 17 G PO PACK
17.0000 g | PACK | Freq: Every day | ORAL | Status: DC | PRN
  Administered 2023-10-15: 17 g via ORAL
  Filled 2023-10-13: qty 1

## 2023-10-13 MED ORDER — ACETAMINOPHEN 325 MG PO TABS: 650.0000 mg | ORAL_TABLET | Freq: Four times a day (QID) | ORAL | Status: DC | PRN

## 2023-10-13 MED ORDER — POTASSIUM CHLORIDE CRYS ER 20 MEQ PO TBCR: 40.0000 meq | EXTENDED_RELEASE_TABLET | Freq: Once | ORAL | Status: DC

## 2023-10-13 MED ORDER — MONTELUKAST SODIUM 10 MG PO TABS
10.0000 mg | ORAL_TABLET | Freq: Every day | ORAL | Status: DC
Start: 2023-10-13 — End: 2023-10-19
  Administered 2023-10-14 – 2023-10-18 (×6): 10 mg via ORAL
  Filled 2023-10-13 (×6): qty 1

## 2023-10-13 MED ORDER — METRONIDAZOLE 500 MG/100ML IV SOLN
500.0000 mg | Freq: Two times a day (BID) | INTRAVENOUS | Status: DC
  Administered 2023-10-13 – 2023-10-19 (×12): 500 mg via INTRAVENOUS
  Filled 2023-10-13 (×12): qty 100

## 2023-10-13 MED ORDER — LOSARTAN POTASSIUM 25 MG PO TABS
12.5000 mg | ORAL_TABLET | Freq: Every day | ORAL | Status: DC
  Administered 2023-10-14 – 2023-10-18 (×5): 12.5 mg via ORAL
  Filled 2023-10-13 (×6): qty 1

## 2023-10-13 MED ORDER — METOPROLOL SUCCINATE ER 50 MG PO TB24
50.0000 mg | ORAL_TABLET | Freq: Every day | ORAL | Status: DC
  Administered 2023-10-14 – 2023-10-19 (×6): 50 mg via ORAL
  Filled 2023-10-13 (×6): qty 1

## 2023-10-13 MED ORDER — VANCOMYCIN HCL 1500 MG/300ML IV SOLN
1500.0000 mg | Freq: Once | INTRAVENOUS | Status: AC
  Administered 2023-10-13: 1500 mg via INTRAVENOUS
  Filled 2023-10-13: qty 300

## 2023-10-13 NOTE — ED Provider Notes (Signed)
[place] EMERGENCY DEPARTMENT AT [facility] Provider Note   CSN: [id number] Arrival date & time: 10/13/23  1504     History  Chief Complaint  Patient presents with   Abscess    [name] is a 73 y.o. female.   Abscess   73 year old female presents emergency department complaints of right-sided abdominal mass as well as abnormal imaging study.  Patient reports right-sided flank mass present since November.  Was seen in the emergency department on 10/05/2023 due to be more swollen and tender inside area.  Had a CT scan that was abnormal but subsequently MRI that was performed 3 days ago concerning for abscess with sinus tract prompting visit again to the emergency department after discussion was had with PCP.  Patient states that the area on her left leg began to drain pus yesterday evening and continued throughout the day.  States that she has had subjective chills over the past couple of days but denies any known fever.  Denies any chest pain, shortness of breath, nausea vomiting urinary symptoms, change in bowel habits.  Currently not taking antibiotics.  Past medical history significant for MI, CVA, hypertension, GERD, hyperlipidemia, COPD, chronic respiratory failure with hypoxia on 3 L with exertion; saturations 86% on room air at rest.  Home Medications Prior to Admission medications   Medication Sig Start Date End Date Taking? Authorizing Provider  acetaminophen (TYLENOL) 650 MG CR tablet Take 1,300 mg by mouth every 8 (eight) hours as needed for pain.   Yes [provider]  clopidogrel (PLAVIX) 75 MG tablet Take 1 tablet (75 mg total) by mouth daily. 06/17/22  Yes Leroy Sea, MD  folic acid (FOLVITE) 1 MG tablet Take 1 tablet (1 mg total) by mouth daily. 10/17/21  Yes Tat, Onalee Hua, MD  Ipratropium-Albuterol (COMBIVENT) 20-100 MCG/ACT AERS respimat Inhale 1 puff into the lungs every 6 (six) hours as needed for wheezing or shortness of breath.  05/09/22  Yes Vassie Loll, MD  losartan (COZAAR) 25 MG tablet Take 12.5 mg by mouth daily. 10/08/23  Yes [provider]  metoprolol succinate (TOPROL XL) 50 MG 24 hr tablet Take 1 tablet (50 mg total) by mouth daily. Take with or immediately following a meal. 03/30/23  Yes Mallipeddi, Vishnu P, MD  mirtazapine (REMERON) 7.5 MG tablet Take 7.5 mg by mouth at bedtime. 04/27/22  Yes [provider]  montelukast (SINGULAIR) 10 MG tablet Take 10 mg by mouth at bedtime.   Yes [provider]  torsemide (DEMADEX) 20 MG tablet Take 40 mg by mouth 2 (two) times daily.   Yes [provider]  vitamin B-12 (CYANOCOBALAMIN) 500 MCG tablet Take 1 tablet (500 mcg total) by mouth daily. 10/16/21  Yes Tat, Onalee Hua, MD      Allergies    Aspirin, Bee venom, Contrast media [iodinated contrast media], Lisinopril, Motrin [ibuprofen], Penicillins, and Dilaudid [hydromorphone hcl]    Review of Systems   Review of Systems  All other systems reviewed and are negative.   Physical Exam Updated Vital Signs BP (!) 133/50 (BP Location: Right Arm)   Pulse 95   Temp 98.5 F (36.9 C) (Oral)   Resp 18   Ht 5' (1.524 m)   Wt 60.9 kg   SpO2 100%   BMI 26.22 kg/m  Physical Exam Vitals and nursing note reviewed.  Constitutional:      General: She is not in acute distress.    Appearance: She is well-developed.  HENT:  Head: Normocephalic and atraumatic.  Eyes:     Conjunctiva/sclera: Conjunctivae normal.  Cardiovascular:     Rate and Rhythm: Regular rhythm. Tachycardia present.     Heart sounds: No murmur heard. Pulmonary:     Effort: Pulmonary effort is normal. No respiratory distress.     Breath sounds: Normal breath sounds.     Comments: 3 L nasal cannula. Abdominal:     Palpations: Abdomen is soft.     Comments: Other large 10 cm show area of induration/palpable fluctuance right side flank.  See media below.  Expressible purulent fluid from opening in skin.  Area tender  to touch and slightly erythematous in appearance.  Musculoskeletal:        General: No swelling.     Cervical back: Neck supple.  Skin:    General: Skin is warm and dry.     Capillary Refill: Capillary refill takes less than 2 seconds.  Neurological:     Mental Status: She is alert.  Psychiatric:        Mood and Affect: Mood normal.     ED Results / Procedures / Treatments   Labs (all labs ordered are listed, but only abnormal results are displayed) Labs Reviewed  CBC - Abnormal; Notable for the following components:      Result Value   WBC 11.4 (*)    RBC 3.06 (*)    Hemoglobin 9.8 (*)    HCT 32.4 (*)    MCV 105.9 (*)    Platelets 504 (*)    All other components within normal limits  BASIC METABOLIC PANEL - Abnormal; Notable for the following components:   Potassium 3.4 (*)    Glucose, Bld 176 (*)    Calcium 7.8 (*)    All other components within normal limits  LACTIC ACID, PLASMA - Abnormal; Notable for the following components:   Lactic Acid, Venous 2.1 (*)    All other components within normal limits  HEMOGLOBIN A1C - Abnormal; Notable for the following components:   Hgb A1c MFr Bld 6.0 (*)    All other components within normal limits  MAGNESIUM - Abnormal; Notable for the following components:   Magnesium 1.0 (*)    All other components within normal limits  LACTIC ACID, PLASMA - Abnormal; Notable for the following components:   Lactic Acid, Venous 2.7 (*)    All other components within normal limits  BASIC METABOLIC PANEL - Abnormal; Notable for the following components:   Glucose, Bld 111 (*)    Calcium 7.4 (*)    All other components within normal limits  CBC - Abnormal; Notable for the following components:   WBC 15.8 (*)    RBC 2.88 (*)    Hemoglobin 9.2 (*)    HCT 30.5 (*)    MCV 105.9 (*)    Platelets 487 (*)    All other components within normal limits  CULTURE, BLOOD (ROUTINE X 2)  CULTURE, BLOOD (ROUTINE X 2)    EKG None  Radiology No  results found.  Procedures Procedures    Medications Ordered in ED Medications  vancomycin (VANCOREADY) IVPB 750 mg/150 mL (has no administration in time range)  metroNIDAZOLE (FLAGYL) IVPB 500 mg (500 mg Intravenous New Bag/Given 10/14/23 0742)  aztreonam (AZACTAM) 2 g in sodium chloride 0.9 % 100 mL IVPB (2 g Intravenous New Bag/Given 10/14/23 0344)  morphine (PF) 2 MG/ML injection 2 mg (2 mg Intravenous Given 10/14/23 0740)  Ipratropium-Albuterol (COMBIVENT) respimat 1 puff (has no administration  in time range)  losartan (COZAAR) tablet 12.5 mg (12.5 mg Oral Given 10/14/23 0846)  metoprolol succinate (TOPROL-XL) 24 hr tablet 50 mg (50 mg Oral Given 10/14/23 0846)  mirtazapine (REMERON) tablet 7.5 mg (7.5 mg Oral Given 10/14/23 0002)  montelukast (SINGULAIR) tablet 10 mg (10 mg Oral Given 10/14/23 0002)  acetaminophen (TYLENOL) tablet 650 mg (has no administration in time range)    Or  acetaminophen (TYLENOL) suppository 650 mg (has no administration in time range)  ondansetron (ZOFRAN) tablet 4 mg (has no administration in time range)    Or  ondansetron (ZOFRAN) injection 4 mg (has no administration in time range)  polyethylene glycol (MIRALAX / GLYCOLAX) packet 17 g (has no administration in time range)  0.9 %  sodium chloride infusion ( Intravenous New Bag/Given 10/14/23 0006)  vancomycin (VANCOREADY) IVPB 1500 mg/300 mL (0 mg Intravenous Stopped 10/13/23 2011)  aztreonam (AZACTAM) 2 g in sodium chloride 0.9 % 100 mL IVPB (0 g Intravenous Stopped 10/13/23 1740)  potassium chloride SA (KLOR-CON M) CR tablet 40 mEq (40 mEq Oral Given 10/14/23 0003)  lactated ringers bolus 500 mL (500 mLs Intravenous New Bag/Given 10/14/23 0006)    ED Course/ Medical Decision Making/ A&P Clinical Course as of 10/14/23 1036  Wed Oct 13, 2023  1632 Consulted Dr. Lovell Sheehan of general surgery who recommended admission to [facility] given the patient has evidence of iliopsoas abscess and need for potential IR  involvement.  Patient also chronically on O2 at home [CR]  1633 Consulted Dr. Andrey Campanile of general surgery at [facility] who recommended consulting with orthopedics.  [CR]  1825 Consulted Dr. Steward Drone of orthopedics who recommended medical admission.  From orthopedic standpoint, no surgical intervention  [CR]    Clinical Course User Index [CR] Peter Garter, PA                                 Medical Decision Making Amount and/or Complexity of Data Reviewed Labs: ordered.  Risk Prescription drug management. Decision regarding hospitalization.   This patient presents to the ED for concern of abscess, this involves an extensive number of treatment options, and is a complaint that carries with it a high risk of complications and morbidity.  The differential diagnosis includes abscess, cellulitis, erysipelas, necrotizing infection, peritonitis, sepsis, other   Co morbidities that complicate the patient evaluation  See HPI   Additional history obtained:  Additional history obtained from EMR External records from outside source obtained and reviewed including hospital records   Lab Tests:  I Ordered, and personally interpreted labs.  The pertinent results include: Leukocytosis of 11.4 anemia with a hemoglobin of 9.8 of which is microcytic in nature near patient's baseline.  Thrombocytosis of 5.4.  Mild hypokalemia 3.4, hypocalcemia of 7.8 but otherwise electrolytes within normal limits.  No renal dysfunction.  Blood cultures pending.   Imaging Studies ordered:  N/a   Cardiac Monitoring: / EKG:  The patient was maintained on a cardiac monitor.  I personally viewed and interpreted the cardiac monitored which showed an underlying rhythm of: Sinus rhythm   Consultations Obtained:  See ED course  Problem List / ED Course / Critical interventions / Medication management  Iliopsoas abscess with sinus tracking I ordered medication including make myosin, aztreonam, Flagyl   Reevaluation of the patient after these medicines showed that the patient improved I have reviewed the patients home medicines and have made adjustments as needed  Social Determinants of Health:  Former cigarette use.  Denies illicit drug use.   Test / Admission - Considered:  Iliopsoas abscess Vitals signs within normal range and stable throughout visit. Laboratory/imaging studies significant for: See above 73 year old female presents emergency department with complaints of mass on right flank.  Area present since November.  Was seen in the ED around a week ago with CT imaging concerning for hematoma versus neoplasm versus other.  Subsequently had MRI of her abdomen 3 days ago concerning for right-sided iliopsoas abscess with sinus tracking to right flank area with subsequent abscesses right flank area as above.  Patient with subjective chills over the past couple of days.  Workup in the ED reassuring with slight leukocytosis of 11 which seems to be unchanged from prior visit 8 days ago.  Consulted general surgery up at [facility] who recommended admission to [facility] for further evaluation/treatment given patient with complicated past medical history and need for most likely IR involvement given location of abscesses.  Consulted general surgery at [facility] who deferred care to orthopedics; subsequently consulted orthopedics at [facility] who deferred care to general surgery.  Awaiting hospitalist consulted at time of shift change.  Patient condition necessitates admission.  At time of shift change, patient care handoff to [facility].  Patient stable upon shift change.        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)  Flank pain    Rx / DC Orders ED Discharge Orders     None         Peter Garter, [place] 10/14/23 1036    Bethann Berkshire, MD 10/18/23 1152

## 2023-10-13 NOTE — Assessment & Plan Note (Signed)
Stable and compensated.  Last echo 03/2023 EF of 50%, showing improved EF from 35 to 40%. -In setting of early sepsis, hold torsemide 40 mg twice daily for now, resume when able -Gentle hydration -Hold Plavix -Resume metoprolol, losartan

## 2023-10-13 NOTE — Assessment & Plan Note (Signed)
Mild.  Potassium 3.4.  Likely from diuretics. -Check Magnesium

## 2023-10-13 NOTE — Assessment & Plan Note (Addendum)
Sepsis likely due to right iliopsoas and right pelvic sidewall abscess with fistulous tract. -Plan per above

## 2023-10-13 NOTE — ED Notes (Signed)
ED TO INPATIENT HANDOFF REPORT  ED Nurse Name and Phone #:   S Name/Age/Gender [name] 73 y.o. female Room/Bed: APA12/APA12  Code Status   Code Status: Full Code  Home/SNF/Other Home Patient oriented to: self, place, time, and situation Is this baseline? Yes   Triage Complete: Triage complete  Chief Complaint Iliopsoas abscess [facility]) [K68.12]  Triage Note Pt arrived via POV from home c/o draining abscess to right flank. There are 2 draining abscesses present in Triage with Serous drainage. Skin in swollen and warm to touch. Pt reports abscess has been present since this past November.    Allergies Allergies  Allergen Reactions   Aspirin Swelling and Other (See Comments)    Only in large doses will cause a reaction   Bee Venom Swelling    Severe swelling   Contrast Media [Iodinated Contrast Media] Swelling   Lisinopril Swelling   Motrin [Ibuprofen] Swelling   Penicillins Anaphylaxis   Dilaudid [Hydromorphone Hcl] Itching    Level of Care/Admitting Diagnosis ED Disposition     ED Disposition  Admit   Condition  --   Comment  Hospital Area: [facility] [[id number]]  Level of Care: Telemetry [5]  Covid Evaluation: Asymptomatic - no recent exposure (last 10 days) testing not required  Diagnosis: Iliopsoas abscess [facility]) [[id number]]  Admitting Physician: Cresenciano Lick  Attending Physician: Onnie Boer Xenia.Douglas  Certification:: I certify this patient will need inpatient services for at least 2 midnights  Expected Medical Readiness: 10/15/2023          B Medical/Surgery History Past Medical History:  Diagnosis Date   Anxiety    Blind    right   Colitis 09/07/2011   CVA (cerebral infarction)    GERD (gastroesophageal reflux disease)    Hyperlipidemia    Hypertension    Myocardial infarct (HCC)    1997, 2004   Severe major depression with psychotic features (HCC)    2004   Stroke [facility]) 2002   Past Surgical History:   Procedure Laterality Date   ABDOMINAL HYSTERECTOMY     BILATERAL SALPINGOOPHORECTOMY     BIOPSY  05/08/2022   Procedure: BIOPSY;  Surgeon: Dolores Frame, MD;  Location: [facility] ENDO SU[facility];  Service: Gastroenterology;;   CHOLECYSTECTOMY N/A 10/15/2021   Procedure: LAPAROSCOPIC CHOLECYSTECTOMY WITH INTRAOPERATIVE CHOLANGIOGRAM;  Surgeon: Franky Macho, MD;  Location: [facility] ORS;  Service: General;  Laterality: N/A;   COLONOSCOPY N/A 07/17/2014   RMR: Extremely redundant colon. Colonic diverticulosis. Inadequate prepartation precluded examination of all the rectal and colonic.  I suspect slow colonic transit in the setting of a markely redundant colon.   CORONARY ANGIOPLASTY WITH STENT PLACEMENT  [id number]   cyst removed from left wrist     ERCP N/A 10/10/2021   Procedure: ENDOSCOPIC RETROGRADE CHOLANGIOPANCREATOGRAPHY (ERCP);  Surgeon: Malissa Hippo, MD;  Location: [facility] ORS;  Service: Gastroenterology;  Laterality: N/A;   ERCP N/A 10/13/2021   Procedure: ENDOSCOPIC RETROGRADE CHOLANGIOPANCREATOGRAPHY (ERCP);  Surgeon: Malissa Hippo, MD;  Location: [facility] ORS;  Service: Gastroenterology;  Laterality: N/A;   ESOPHAGEAL DILATION  05/08/2022   Procedure: ESOPHAGEAL DILATION;  Surgeon: Marguerita Merles, Reuel Boom, MD;  Location: [facility];  Service: Gastroenterology;;  savory dilation'   ESOPHAGOGASTRODUODENOSCOPY  09/05/2004   Dr Jimmye Norman gastritis, candida esophagitis   ESOPHAGOGASTRODUODENOSCOPY  09/07/2011   AOZ:HYQM, esophageal in the distal esophagus/Mild gastritis   ESOPHAGOGASTRODUODENOSCOPY (EGD) WITH PROPOFOL N/A 05/08/2022   Procedure: ESOPHAGOGASTRODUODENOSCOPY (EGD) WITH PROPOFOL;  Surgeon: Dolores Frame,  MD;  Location: [facility];  Service: Gastroenterology;  Laterality: N/A;  with possible esophageal dilation   Lumps removed from each breast     SPHINCTEROTOMY N/A 10/10/2021   Procedure: SPHINCTEROTOMY INCOMPLETE, STONE NOT REMOVED;  Surgeon: Malissa Hippo,  MD;  Location: [facility] ORS;  Service: Gastroenterology;  Laterality: N/A;     A IV Location/Drains/Wounds Patient Lines/Drains/Airways Status     Active Line/Drains/Airways     Name Placement date Placement time Site Days   Peripheral IV 10/13/23 20 G 1" Anterior;Distal;Left Forearm 10/13/23  1700  Forearm  less than 1   Pressure Injury 06/06/22 Sacrum Medial Stage 2 -  Partial thickness loss of dermis presenting as a shallow open injury with a red, pink wound bed without slough. 06/06/22  2159  -- 494            Intake/Output Last 24 hours No intake or output data in the 24 hours ending 10/13/23 2312  Labs/Imaging Results for orders placed or performed during the hospital encounter of 10/13/23 (from the past 48 hours)  CBC     Status: Abnormal   Collection Time: 10/13/23  4:08 PM  Result Value Ref Range   WBC 11.4 (H) 4.0 - 10.5 K/uL   RBC 3.06 (L) 3.87 - 5.11 MIL/uL   Hemoglobin 9.8 (L) 12.0 - 15.0 g/dL   HCT 84.6 (L) 96.2 - 95.2 %   MCV 105.9 (H) 80.0 - 100.0 fL   MCH 32.0 26.0 - 34.0 pg   MCHC 30.2 30.0 - 36.0 g/dL   RDW 84.1 32.4 - 40.1 %   Platelets 504 (H) 150 - 400 K/uL   nRBC 0.0 0.0 - 0.2 %    Comment: Performed at [facility], [address], [place], [place] [address]  Basic metabolic panel     Status: Abnormal   Collection Time: 10/13/23  4:08 PM  Result Value Ref Range   Sodium 141 135 - 145 mmol/L   Potassium 3.4 (L) 3.5 - 5.1 mmol/L   Chloride 99 98 - 111 mmol/L   CO2 28 22 - 32 mmol/L   Glucose, Bld 176 (H) 70 - 99 mg/dL    Comment: Glucose reference range applies only to samples taken after fasting for at least 8 hours.   BUN 15 8 - 23 mg/dL   Creatinine, Ser 3.66 0.44 - 1.00 mg/dL   Calcium 7.8 (L) 8.9 - 10.3 mg/dL   GFR, Estimated >44 >03 mL/min    Comment: (NOTE) Calculated using the CKD-EPI Creatinine Equation (2021)    Anion gap 14 5 - 15    Comment: Performed at [facility], [address] [place], [place] [address]  Magnesium     Status:  Abnormal   Collection Time: 10/13/23  4:08 PM  Result Value Ref Range   Magnesium 1.0 (L) 1.7 - 2.4 mg/dL    Comment: Performed at [facility], [address] [place], [place] [address]  Blood culture (routine x 2)     Status: None (Preliminary result)   Collection Time: 10/13/23  4:54 PM   Specimen: BLOOD LEFT WRIST  Result Value Ref Range   Specimen Description BLOOD LEFT WRIST    Special Requests      BOTTLES DRAWN AEROBIC AND ANAEROBIC Blood Culture adequate volume Performed at [facility], [address], [place], [place] [address]    Culture PENDING    Report Status PENDING   Blood culture (routine x 2)     Status: None (  Preliminary result)   Collection Time: 10/13/23  4:54 PM   Specimen: BLOOD LEFT WRIST  Result Value Ref Range   Specimen Description BLOOD LEFT WRIST    Special Requests      BOTTLES DRAWN AEROBIC AND ANAEROBIC Blood Culture adequate volume Performed at [facility], [address], [place], [place] [address]    Culture PENDING    Report Status PENDING   Lactic acid, plasma     Status: Abnormal   Collection Time: 10/13/23  9:35 PM  Result Value Ref Range   Lactic Acid, Venous 2.1 (HH) 0.5 - 1.9 mmol/L    Comment: CRITICAL RESULT CALLED TO, READ BACK BY AND VERIFIED WITH STEELE,D ON 10/13/23 AT 2240 BY LOY,C Performed at [facility], [address] [place], [place] [address]    No results found.  Pending Labs Unresulted Labs (From admission, onward)     Start     Ordered   10/14/23 0200  Lactic acid, plasma  (Lactic Acid)  Once,   R        10/13/23 2304   10/13/23 2129  Hemoglobin A1c  Add-on,   AD        10/13/23 2128   Signed and Held  Basic metabolic panel  Tomorrow morning,   R        Signed and Held   Signed and Held  CBC  Tomorrow morning,   R        Signed and Held            Vitals/Pain Today's Vitals   10/13/23 1521 10/13/23 1522 10/13/23 1829 10/13/23 2138  BP: (!) 141/65  (!) 142/68 (!) 146/75  Pulse: (!) 104  95 89  Resp:  18  16 16   Temp: 98.7 F (37.1 C)  98.5 F (36.9 C) 98.2 F (36.8 C)  TempSrc: Oral  Oral   SpO2: 100%  100% 100%  Weight:  139 lb 15.9 oz (63.5 kg)    Height:  5' (1.524 m)    PainSc:  7       Isolation Precautions No active isolations  Medications Medications  vancomycin (VANCOREADY) IVPB 750 mg/150 mL (has no administration in time range)  metroNIDAZOLE (FLAGYL) IVPB 500 mg (0 mg Intravenous Stopped 10/13/23 2121)  aztreonam (AZACTAM) 2 g in sodium chloride 0.9 % 100 mL IVPB (has no administration in time range)  morphine (PF) 2 MG/ML injection 2 mg (has no administration in time range)  0.9 %  sodium chloride infusion (has no administration in time range)  potassium chloride SA (KLOR-CON M) CR tablet 40 mEq (40 mEq Oral Given 10/13/23 2211)  lactated ringers bolus 500 mL (has no administration in time range)  vancomycin (VANCOREADY) IVPB 1500 mg/300 mL (0 mg Intravenous Stopped 10/13/23 2011)  aztreonam (AZACTAM) 2 g in sodium chloride 0.9 % 100 mL IVPB (0 g Intravenous Stopped 10/13/23 1740)    Mobility walks     Focused Assessments    R Recommendations: See Admitting Provider Note  Report given to:   Additional Notes:

## 2023-10-13 NOTE — Progress Notes (Addendum)
Pharmacy Antibiotic Note  [name] is a 73 y.o. female admitted on 10/13/2023 with  abscess .  Pharmacy has been consulted for vancomycin and zosyn dosing.    Plan: Vancomycin 750 IV every 24 hours.  Goal trough 15-20 mcg/mL. eAUC of 481 based on scr of 1.0 Penicillin allergy, will use aztreonam  Height: 5' (152.4 cm) Weight: 63.5 kg (139 lb 15.9 oz) IBW/kg (Calculated) : 45.5  Temp (24hrs), Avg:98.7 F (37.1 C), Min:98.7 F (37.1 C), Max:98.7 F (37.1 C)  No results for input(s): "WBC", "CREATININE", "LATICACIDVEN", "VANCOTROUGH", "VANCOPEAK", "VANCORANDOM", "GENTTROUGH", "GENTPEAK", "GENTRANDOM", "TOBRATROUGH", "TOBRAPEAK", "TOBRARND", "AMIKACINPEAK", "AMIKACINTROU", "AMIKACIN" in the last 168 hours.  Estimated Creatinine Clearance: 41.9 mL/min (by C-G formula based on SCr of 0.98 mg/dL).    Allergies  Allergen Reactions   Aspirin Swelling and Other (See Comments)    Only in large doses will cause a reaction   Bee Venom Swelling    Severe swelling   Contrast Media [Iodinated Contrast Media] Swelling   Lisinopril Swelling   Motrin [Ibuprofen] Swelling   Penicillins Anaphylaxis   Dilaudid [Hydromorphone Hcl] Itching   Thank you for allowing pharmacy to be a part of this patient's care.  Sheppard Coil PharmD., BCPS Clinical Pharmacist 10/13/2023 4:16 PM

## 2023-10-13 NOTE — H&P (Addendum)
History and Physical    [name] NUU:[id number] DOB: [birth year] DOA: 10/13/2023  PCP: Benetta Spar, MD   Patient coming from: Home  I have personally briefly reviewed patient's old medical records in [facility]  Chief Complaint: Right flank pain  HPI: [name] is a 73 y.o. 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 Patient presented to the ED with complaints of right flank pain and swelling that started November.  She reports onset of drainage from her right flank yesterday.  She reports chills without fevers.  No vomiting.  No difficulty breathing.  Patient came to the ED 1/14-CT scan showed heterogeneous irregular collection containing fluid or mass.  MRI was recommended for further evaluation.  Patient said she would rather follow-up as outpatient for MRI.  Patient was also to follow-up with Dr. Henreitta Leber in clinic. MRI done 1/19- . Multiple complex fluid collections in the right iliopsoas musculature and right pelvic sidewall, with fistulous tracts extending into the right flank musculature and subcutaneous fat of the right flank, as above, concerning for abscesses. Her outpatient provider called her to come to the ED.  ED Course: Tmax 98.7.  Heart rate 95-104.  Blood pressure systolic 140s.  Respiratory 16-18.  O2 sat 100% on 3 L.  WBC 11.4. Blood cultures obtained. IV Vanco, metronidazole and aztreonam started. EDP talked to Dr. Lovell Sheehan, recommended admission to [facility] possibly IR involvement, EDP talked to general surgery at [facility] Dr. Andrey Campanile recommended consulting Ortho instead.  EDP asked to Dr. Jobe Marker calling general surgery back, not an Ortho case. Hospitalist called to admit.  Review of Systems: As per HPI all other systems reviewed and negative.  Past Medical History:  Diagnosis Date   Anxiety    Blind    right   Colitis  09/07/2011   CVA (cerebral infarction)    GERD (gastroesophageal reflux disease)    Hyperlipidemia    Hypertension    Myocardial infarct (HCC)    1997, 2004   Severe major depression with psychotic features (HCC)    2004   Stroke [facility]) 2002    Past Surgical History:  Procedure Laterality Date   ABDOMINAL HYSTERECTOMY     BILATERAL SALPINGOOPHORECTOMY     BIOPSY  05/08/2022   Procedure: BIOPSY;  Surgeon: Dolores Frame, MD;  Location: [facility];  Service: Gastroenterology;;   CHOLECYSTECTOMY N/A 10/15/2021   Procedure: LAPAROSCOPIC CHOLECYSTECTOMY WITH INTRAOPERATIVE CHOLANGIOGRAM;  Surgeon: Franky Macho, MD;  Location: [facility] ORS;  Service: General;  Laterality: N/A;   COLONOSCOPY N/A 07/17/2014   RMR: Extremely redundant colon. Colonic diverticulosis. Inadequate prepartation precluded examination of all the rectal and colonic.  I suspect slow colonic transit in the setting of a markely redundant colon.   CORONARY ANGIOPLASTY WITH STENT PLACEMENT  [id number]   cyst removed from left wrist     ERCP N/A 10/10/2021   Procedure: ENDOSCOPIC RETROGRADE CHOLANGIOPANCREATOGRAPHY (ERCP);  Surgeon: Malissa Hippo, MD;  Location: [facility];  Service: Gastroenterology;  Laterality: N/A;   ERCP N/A 10/13/2021   Procedure: ENDOSCOPIC RETROGRADE CHOLANGIOPANCREATOGRAPHY (ERCP);  Surgeon: Malissa Hippo, MD;  Location: [facility];  Service: Gastroenterology;  Laterality: N/A;   ESOPHAGEAL DILATION  05/08/2022   Procedure: ESOPHAGEAL DILATION;  Surgeon: Dolores Frame, MD;  Location: [facility];  Service: Gastroenterology;;  savory dilation'   ESOPHAGOGASTRODUODENOSCOPY  09/05/2004   Dr Jimmye Norman gastritis, candida esophagitis   ESOPHAGOGASTRODUODENOSCOPY  09/07/2011   ZOX:WRUE, esophageal in the distal esophagus/Mild gastritis   ESOPHAGOGASTRODUODENOSCOPY (EGD) WITH PROPOFOL N/A 05/08/2022   Procedure: ESOPHAGOGASTRODUODENOSCOPY (EGD) WITH PROPOFOL;  Surgeon: Dolores Frame, MD;  Location: [facility];  Service: Gastroenterology;  Laterality: N/A;  with possible esophageal dilation   Lumps removed from each breast     SPHINCTEROTOMY N/A 10/10/2021   Procedure: SPHINCTEROTOMY INCOMPLETE, STONE NOT REMOVED;  Surgeon: Malissa Hippo, MD;  Location: [facility];  Service: Gastroenterology;  Laterality: N/A;     reports that she quit smoking about 6 years ago. Her smoking use included cigarettes. She started smoking about 46 years ago. She has a 10 pack-year smoking history. She has never used smokeless tobacco. She reports current drug use. Drug: Marijuana. She reports that she does not drink alcohol.  Allergies  Allergen Reactions   Aspirin Swelling and Other (See Comments)    Only in large doses will cause a reaction   Bee Venom Swelling    Severe swelling   Contrast Media [Iodinated Contrast Media] Swelling   Lisinopril Swelling   Motrin [Ibuprofen] Swelling   Penicillins Anaphylaxis   Dilaudid [Hydromorphone Hcl] Itching    Family History  Problem Relation Age of Onset   Multiple sclerosis Father    Alzheimer's disease Mother    Hypertension Mother    Diabetes Mother    Colon cancer Neg Hx     Prior to Admission medications   Medication Sig Start Date End Date Taking? Authorizing Provider  acetaminophen (TYLENOL) 650 MG CR tablet Take 1,300 mg by mouth every 8 (eight) hours as needed for pain.   Yes [provider]  clopidogrel (PLAVIX) 75 MG tablet Take 1 tablet (75 mg total) by mouth daily. 06/17/22  Yes Leroy Sea, MD  folic acid (FOLVITE) 1 MG tablet Take 1 tablet (1 mg total) by mouth daily. 10/17/21  Yes Tat, Onalee Hua, MD  Ipratropium-Albuterol (COMBIVENT) 20-100 MCG/ACT AERS respimat Inhale 1 puff into the lungs every 6 (six) hours as needed for wheezing or shortness of breath. 05/09/22  Yes Vassie Loll, MD  losartan (COZAAR) 25 MG tablet Take 12.5 mg by mouth daily. 10/08/23  Yes [provider]  metoprolol  succinate (TOPROL XL) 50 MG 24 hr tablet Take 1 tablet (50 mg total) by mouth daily. Take with or immediately following a meal. 03/30/23  Yes Mallipeddi, Vishnu P, MD  mirtazapine (REMERON) 7.5 MG tablet Take 7.5 mg by mouth at bedtime. 04/27/22  Yes [provider]  montelukast (SINGULAIR) 10 MG tablet Take 10 mg by mouth at bedtime.   Yes [provider]  torsemide (DEMADEX) 20 MG tablet Take 40 mg by mouth 2 (two) times daily.   Yes [provider]  vitamin B-12 (CYANOCOBALAMIN) 500 MCG tablet Take 1 tablet (500 mcg total) by mouth daily. 10/16/21  Beatrix Shipper, MD    Physical Exam: Vitals:   10/13/23 1521 10/13/23 1522 10/13/23 1829  BP: (!) 141/65  (!) 142/68  Pulse: (!) 104  95  Resp: 18  16  Temp: 98.7 F (37.1 C)  98.5 F (36.9 C)  TempSrc: Oral  Oral  SpO2: 100%  100%  Weight:  63.5 kg   Height:  5' (1.524 m)     Constitutional: NAD, calm, comfortable Vitals:   10/13/23 1521 10/13/23 1522 10/13/23 1829  BP: (!) 141/65  (!) 142/68  Pulse: (!) 104  95  Resp: 18  16  Temp: 98.7 F (37.1 C)  98.5  F (36.9 C)  TempSrc: Oral  Oral  SpO2: 100%  100%  Weight:  63.5 kg   Height:  5' (1.524 m)    Eyes: PERRL, lids and conjunctivae normal ENMT: Mucous membranes are moist.  Neck: normal, supple, no masses, no thyromegaly Respiratory: clear to auscultation bilaterally, no wheezing, no crackles. Normal respiratory effort. No accessory muscle use.  Cardiovascular: Regular rate and rhythm, no murmurs / rubs / gallops. No extremity edema.  Extremities warm. Abdomen: no tenderness, no masses palpated. No hepatosplenomegaly. Bowel sounds positive.  Musculoskeletal: no clubbing / cyanosis. No joint deformity upper and lower extremities.  Skin: Induration, erythema, marked tenderness, to right flank, open small wound, with purulent drainage soiling clothing and bedding. Neurologic: No facial asymmetry, speech fluent, 4+/5 strength in all extremities.   Psychiatric: Normal judgment and insight. Alert and oriented x 3. Normal mood.    Right flank area-     Labs on Admission: I have personally reviewed following labs and imaging studies  CBC: Recent Labs  Lab 10/13/23 1608  WBC 11.4*  HGB 9.8*  HCT 32.4*  MCV 105.9*  PLT 504*   Basic Metabolic Panel: Recent Labs  Lab 10/13/23 1608  NA 141  K 3.4*  CL 99  CO2 28  GLUCOSE 176*  BUN 15  CREATININE 0.92  CALCIUM 7.8*   Radiological Exams on Admission: No results found.  EKG: None   Assessment/Plan Principal Problem:   Iliopsoas abscess (HCC) Active Problems:   Sepsis (HCC)   Essential hypertension   Chronic hypoxic respiratory failure (HCC)   Assessment and Plan: * Iliopsoas abscess (HCC) Presenting with likely early sepsis, WBC-11.4, with tachycardia heart rate 95-104.  Tmax 98.7.  MRI abdomen with and without contrast 10/10/2023-  Multiple complex fluid collections in the right iliopsoas musculature and right pelvic sidewall, with fistulous tracts extending into the right flank musculature and subcutaneous fat of the right flank, as above, concerning for abscesses. - EDP talked to Dr. Lovell Sheehan, recommended admission to [facility] possibly IR involvement, EDP talked to general surgery at [facility] Dr. Andrey Campanile recommended consulting Ortho instead.  - EDP asked to Dr. Jobe Marker calling general surgery back, not an Ortho case. -Continue IV vancomycin aztreonam and metronidazole -Follow-up blood cultures -  lactic acid 2.1, trend -I talked to IR Dr. Deanne Coffer, graciously agreed to see patient - plan to set patient up for drain placement x2 in the retroperitoneal and R body wall collections tomorrow, NPO MN and hold anticoags. -Unfortunately per bed placement, no beds available at [facility], IR also suggested patient can be transferred to [facility] for procedure and transferred back to [facility] via [facility].  Admission order changed to [facility]. -IV  morphine 2 mg as needed - 500 bolus LR, continue N/s 75cc/hr x 12hrs - HgbA1c  Sepsis (HCC) Sepsis likely due to right iliopsoas and right pelvic sidewall abscess with fistulous tract. -Plan per above  Hypokalemia Mild.  Potassium 3.4.  Likely from diuretics. -Check Magnesium  Chronic systolic CHF (congestive heart failure) (HCC) Stable and compensated.  Last echo 03/2023 EF of 50%, showing improved EF from 35 to 40%. -In setting of early sepsis, hold torsemide 40 mg twice daily for now, resume when able -Gentle hydration -Hold Plavix -Resume metoprolol, losartan  COPD GOLD ? 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  Stable.  No wheezing.    DVT prophylaxis: SCDs Code Status: Full code Family Communication: None at bedside Disposition Plan: ~/> 2 days Consults  called: IR Admission status: Inpt tele I certify that at the point of admission it is my clinical judgment that the patient will require inpatient hospital care spanning beyond 2 midnights from the point of admission due to high intensity of service, high risk for further deterioration and high frequency of surveillance required.   Author: Onnie Boer, MD 10/13/2023 11:05 PM  For on call review [email or web address].

## 2023-10-13 NOTE — ED Triage Notes (Signed)
Pt arrived via POV from home c/o draining abscess to right flank. There are 2 draining abscesses present in Triage with Serous drainage. Skin in swollen and warm to touch. Pt reports abscess has been present since this past November.

## 2023-10-13 NOTE — ED Notes (Signed)
Pt reports she was recently seen for this abscess and had an MRI 3 days ago, and reports she just received a phone call by her PCP advising her to come back to [facility] for further evaluation.

## 2023-10-13 NOTE — ED Provider Notes (Signed)
  Physical Exam  BP (!) 142/68   Pulse 95   Temp 98.5 F (36.9 C) (Oral)   Resp 16   Ht 5' (1.524 m)   Wt 63.5 kg   SpO2 100%   BMI 27.34 kg/m   Physical Exam  Procedures  Procedures  ED Course / MDM   Clinical Course as of 10/13/23 1900  Wed Oct 13, 2023  1632 Consulted Dr. Lovell Sheehan of general surgery who recommended admission to [facility] given the patient has evidence of iliopsoas abscess and need for potential IR involvement.  Patient also chronically on O2 at home [CR]  1633 Consulted Dr. Andrey Campanile of general surgery at [facility] who recommended consulting with orthopedics.  [CR]  1825 Consulted Dr. Steward Drone of orthopedics who recommended medical admission.  From orthopedic standpoint, no surgical intervention  [CR]    Clinical Course User Index [CR] Peter Garter, PA   Medical Decision Making Amount and/or Complexity of Data Reviewed Labs: ordered.  Risk Prescription drug management. Decision regarding hospitalization.  Signout taken from [employer].  Patient has apparent iliopsoas abscess, awaiting callback from hospitalist to see if there is any further action for [place] to take, or if patient will be does admitted to [facility] for surgery consultation.  Dr. Mariea Clonts spoke to interventional radiology, they are going to take care of patient's abscess and patient will be admitted to [facility].        Josem Kaufmann 10/13/23 1945    Bethann Berkshire, MD 10/18/23 1152

## 2023-10-13 NOTE — Assessment & Plan Note (Addendum)
Presenting with likely early sepsis, WBC-11.4, with tachycardia heart rate 95-104.  Tmax 98.7.  MRI abdomen with and without contrast 10/10/2023-  Multiple complex fluid collections in the right iliopsoas musculature and right pelvic sidewall, with fistulous tracts extending into the right flank musculature and subcutaneous fat of the right flank, as above, concerning for abscesses. - EDP talked to Dr. Lovell Sheehan, recommended admission to [facility] possibly IR involvement, EDP talked to general surgery at [facility] Dr. Andrey Campanile recommended consulting Ortho instead.  - EDP asked to Dr. Jobe Marker calling general surgery back, not an Ortho case. -Continue IV vancomycin aztreonam and metronidazole -Follow-up blood cultures -  lactic acid 2.1, trend -I talked to IR Dr. Deanne Coffer, graciously agreed to see patient - plan to set patient up for drain placement x2 in the retroperitoneal and R body wall collections tomorrow, NPO MN and hold anticoags. -Unfortunately per bed placement, no beds available at [facility], IR also suggested patient can be transferred to [facility] for procedure and transferred back to [facility] via [facility].  Admission order changed to [facility]. -IV morphine 2 mg as needed - 500 bolus LR, continue N/s 75cc/hr x 12hrs - HgbA1c

## 2023-10-13 NOTE — Assessment & Plan Note (Signed)
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  Stable.  No wheezing.

## 2023-10-14 ENCOUNTER — Encounter (HOSPITAL_COMMUNITY): Payer: Self-pay | Admitting: Internal Medicine

## 2023-10-14 DIAGNOSIS — K6812 Psoas muscle abscess: Secondary | ICD-10-CM | POA: Diagnosis not present

## 2023-10-14 LAB — BASIC METABOLIC PANEL
Anion gap: 14 (ref 5–15)
BUN: 15 mg/dL (ref 8–23)
CO2: 25 mmol/L (ref 22–32)
Calcium: 7.4 mg/dL — ABNORMAL LOW (ref 8.9–10.3)
Chloride: 103 mmol/L (ref 98–111)
Creatinine, Ser: 0.83 mg/dL (ref 0.44–1.00)
GFR, Estimated: >60 mL/min (ref >60)
Glucose, Bld: 111 mg/dL — ABNORMAL HIGH (ref 70–99)
Potassium: 3.5 mmol/L (ref 3.5–5.1)
Sodium: 142 mmol/L (ref 135–145)

## 2023-10-14 LAB — CBC
HCT: 30.5 % — ABNORMAL LOW (ref 36.0–46.0)
Hemoglobin: 9.2 g/dL — ABNORMAL LOW (ref 12.0–15.0)
MCH: 31.9 pg (ref 26.0–34.0)
MCHC: 30.2 g/dL (ref 30.0–36.0)
MCV: 105.9 fL — ABNORMAL HIGH (ref 80.0–100.0)
Platelets: 487 10*3/uL — ABNORMAL HIGH (ref 150–400)
RBC: 2.88 MIL/uL — ABNORMAL LOW (ref 3.87–5.11)
RDW: 14 % (ref 11.5–15.5)
WBC: 15.8 10*3/uL — ABNORMAL HIGH (ref 4.0–10.5)
nRBC: 0 % (ref 0.0–0.2)

## 2023-10-14 LAB — HEMOGLOBIN A1C
Hgb A1c MFr Bld: 6 % — ABNORMAL HIGH (ref 4.8–5.6)
Mean Plasma Glucose: 125.5 mg/dL

## 2023-10-14 LAB — LACTIC ACID, PLASMA: Lactic Acid, Venous: 2.7 mmol/L (ref 0.5–1.9)

## 2023-10-14 MED ORDER — AZTREONAM 2 G IJ SOLR
INTRAMUSCULAR | Status: AC
  Filled 2023-10-14: qty 10

## 2023-10-14 MED ORDER — SODIUM CHLORIDE 0.9 % IV SOLN
2.0000 g | INTRAVENOUS | Status: DC
  Administered 2023-10-14 – 2023-10-18 (×5): 2 g via INTRAVENOUS
  Filled 2023-10-14 (×5): qty 20

## 2023-10-14 NOTE — Progress Notes (Signed)
PROGRESS NOTE    KENTRICE BRETON  ACZ:[id number] DOB: 04-May-1951 DOA: 10/13/2023 PCP: Benetta Spar, MD   Brief Narrative:    [name] is a 73 y.o. 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 Patient presented to the ED with complaints of right flank pain and swelling that started November.  She reports onset of drainage from her right flank yesterday.  Patient was admitted for sepsis, POA secondary to iliopsoas abscess and plan is for IR to place drain on 1/24.  Assessment & Plan:   Principal Problem:   Iliopsoas abscess (HCC) Active Problems:   Sepsis (HCC)   Hypokalemia   Chronic hypoxic respiratory failure (HCC)   Chronic systolic CHF (congestive heart failure) (HCC)   Essential hypertension   COPD GOLD ?  Assessment and Plan:   Sepsis, POA, secondary to iliopsoas abscess (HCC) Presenting with likely early sepsis, WBC-11.4, with tachycardia heart rate 95-104.  Tmax 98.7.  MRI abdomen with and without contrast 10/10/2023-  Multiple complex fluid collections in the right iliopsoas musculature and right pelvic sidewall, with fistulous tracts extending into the right flank musculature and subcutaneous fat of the right flank, as above, concerning for abscesses. -Continue IV antibiotics as prescribed with plans to keep n.p.o. after midnight -IR planning for drain placement 1/24 - HgbA1c 6.0%  Chronic systolic CHF (congestive heart failure) (HCC) Stable and compensated.  Last echo 03/2023 EF of 50%, showing improved EF from 35 to 40%. -In setting of early sepsis, hold torsemide 40 mg twice daily for now, resume when able -Start diet and hold further IV hydration -Hold Plavix -Resume metoprolol, losartan  COPD GOLD ? 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  Stable.  No wheezing.    DVT prophylaxis: SCDs Code Status: Full Family Communication: None at  bedside Disposition Plan: Plan for drain placement per IR 1/24 Status is: Inpatient Remains inpatient appropriate because: Need for IV medications.   Consultants:  IR  Procedures:  None  Antimicrobials:  Anti-infectives (From admission, onward)    Start     Dose/Rate Route Frequency Ordered Stop   10/14/23 1800  vancomycin (VANCOREADY) IVPB 750 mg/150 mL        750 mg 150 mL/hr over 60 Minutes Intravenous Every 24 hours 10/13/23 1618     10/14/23 0400  aztreonam (AZACTAM) 2 g in sodium chloride 0.9 % 100 mL IVPB        2 g 200 mL/hr over 30 Minutes Intravenous Every 8 hours 10/13/23 1653     10/13/23 1800  metroNIDAZOLE (FLAGYL) IVPB 500 mg        500 mg 100 mL/hr over 60 Minutes Intravenous Every 12 hours 10/13/23 1622     10/13/23 1630  vancomycin (VANCOREADY) IVPB 1500 mg/300 mL        1,500 mg 150 mL/hr over 120 Minutes Intravenous  Once 10/13/23 1617 10/13/23 2011   10/13/23 1630  aztreonam (AZACTAM) 2 g in sodium chloride 0.9 % 100 mL IVPB        2 g 200 mL/hr over 30 Minutes Intravenous  Once 10/13/23 1620 10/13/23 1740       Subjective: Patient seen and evaluated today with no new acute complaints or concerns. No acute concerns or events noted overnight.  Objective: Vitals:   10/13/23 1829 10/13/23 2138 10/13/23 2354 10/14/23 0453  BP: (!) 142/68 (!) 146/75 (!) 144/76 (!) 133/50  Pulse: 95 89 (!) 106  95  Resp: 16 16 20 18   Temp: 98.5 F (36.9 C) 98.2 F (36.8 C) 98.5 F (36.9 C) 98.5 F (36.9 C)  TempSrc: Oral   Oral  SpO2: 100% 100% 94% 100%  Weight:   60.9 kg   Height:        Intake/Output Summary (Last 24 hours) at 10/14/2023 0645 Last data filed at 10/14/2023 0500 Gross per 24 hour  Intake 879.28 ml  Output --  Net 879.28 ml   Filed Weights   10/13/23 1522 10/13/23 2354  Weight: 63.5 kg 60.9 kg    Examination:  General exam: Appears calm and comfortable  Respiratory system: Clear to auscultation. Respiratory effort  normal. Cardiovascular system: S1 & S2 heard, RRR.  Gastrointestinal system: Abdomen is soft Central nervous system: Alert and awake Extremities: No edema Skin: No significant lesions noted Psychiatry: Flat affect.    Data Reviewed: I have personally reviewed following labs and imaging studies  CBC: Recent Labs  Lab 10/13/23 1608 10/14/23 0216  WBC 11.4* 15.8*  HGB 9.8* 9.2*  HCT 32.4* 30.5*  MCV 105.9* 105.9*  PLT 504* 487*   Basic Metabolic Panel: Recent Labs  Lab 10/13/23 1608 10/14/23 0216  NA 141 142  K 3.4* 3.5  CL 99 103  CO2 28 25  GLUCOSE 176* 111*  BUN 15 15  CREATININE 0.92 0.83  CALCIUM 7.8* 7.4*  MG 1.0*  --    GFR: Estimated Creatinine Clearance: 48.5 mL/min (by C-G formula based on SCr of 0.83 mg/dL). Liver Function Tests: No results for input(s): "AST", "ALT", "ALKPHOS", "BILITOT", "PROT", "ALBUMIN" in the last 168 hours. No results for input(s): "LIPASE", "AMYLASE" in the last 168 hours. No results for input(s): "AMMONIA" in the last 168 hours. Coagulation Profile: No results for input(s): "INR", "PROTIME" in the last 168 hours. Cardiac Enzymes: No results for input(s): "CKTOTAL", "CKMB", "CKMBINDEX", "TROPONINI" in the last 168 hours. BNP (last 3 results) No results for input(s): "PROBNP" in the last 8760 hours. HbA1C: No results for input(s): "HGBA1C" in the last 72 hours. CBG: No results for input(s): "GLUCAP" in the last 168 hours. Lipid Profile: No results for input(s): "CHOL", "HDL", "LDLCALC", "TRIG", "CHOLHDL", "LDLDIRECT" in the last 72 hours. Thyroid Function Tests: No results for input(s): "TSH", "T4TOTAL", "FREET4", "T3FREE", "THYROIDAB" in the last 72 hours. Anemia Panel: No results for input(s): "VITAMINB12", "FOLATE", "FERRITIN", "TIBC", "IRON", "RETICCTPCT" in the last 72 hours. Sepsis Labs: Recent Labs  Lab 10/13/23 2135 10/14/23 0216  LATICACIDVEN 2.1* 2.7*    Recent Results (from the past 240 hours)  Blood  culture (routine x 2)     Status: None (Preliminary result)   Collection Time: 10/13/23  4:54 PM   Specimen: BLOOD LEFT WRIST  Result Value Ref Range Status   Specimen Description BLOOD LEFT WRIST  Final   Special Requests   Final    BOTTLES DRAWN AEROBIC AND ANAEROBIC Blood Culture adequate volume   Culture   Final    NO GROWTH < 24 HOURS Performed at [facility], [address] [place], [place] [address]    Report Status PENDING  Incomplete  Blood culture (routine x 2)     Status: None (Preliminary result)   Collection Time: 10/13/23  4:54 PM   Specimen: BLOOD LEFT WRIST  Result Value Ref Range Status   Specimen Description BLOOD LEFT WRIST  Final   Special Requests   Final    BOTTLES DRAWN AEROBIC AND ANAEROBIC Blood Culture adequate volume   Culture  Final    NO GROWTH < 24 HOURS Performed at [facility], [address] [place], [place] [address]    Report Status PENDING  Incomplete         Radiology Studies: No results found.      Scheduled Meds:  losartan  12.5 mg Oral Daily   metoprolol succinate  50 mg Oral Daily   mirtazapine  7.5 mg Oral QHS   montelukast  10 mg Oral QHS   Continuous Infusions:  sodium chloride 75 mL/hr at 10/14/23 0006   aztreonam 2 g (10/14/23 0344)   metronidazole Stopped (10/13/23 2121)   vancomycin       LOS: 1 day    Time spent: 55 minutes    Joyelle Siedlecki D Sherryll Burger, DO [facility]  If 7PM-7AM, please contact night-coverage [email or web address] 10/14/2023, 6:45 AM

## 2023-10-14 NOTE — Plan of Care (Signed)

## 2023-10-14 NOTE — Progress Notes (Signed)
   10/14/23 1007  TOC Brief Assessment  Insurance and Status Reviewed  Patient has primary care physician Yes  Home environment has been reviewed from home with family  Prior level of function: independent  Prior/Current Home Services No current home services  Social Drivers of Health Review SDOH reviewed no interventions necessary  Readmission risk has been reviewed Yes  Transition of care needs no transition of care needs at this time    Transition of Care Department [facility]) has reviewed patient and no TOC needs have been identified at this time. We will continue to monitor patient advancement through interdisciplinary progression rounds. If new patient transition needs arise, please place a TOC consult.

## 2023-10-14 NOTE — Progress Notes (Signed)
Patient to floor, alert and oriented. Vitals stable.  Patient does have red, raised hardened area to right lower back. Patient does have  black cell phone, earrings, silver bracelet and watch on her person and admission.

## 2023-10-15 ENCOUNTER — Inpatient Hospital Stay (HOSPITAL_COMMUNITY)
Admit: 2023-10-15 | Discharge: 2023-10-15 | Disposition: A | Discharge status: Home / Self Care | Payer: Medicare Other | Attending: Internal Medicine | Admitting: Internal Medicine

## 2023-10-15 DIAGNOSIS — K6812 Psoas muscle abscess: Secondary | ICD-10-CM | POA: Diagnosis not present

## 2023-10-15 LAB — BASIC METABOLIC PANEL
Anion gap: 13 (ref 5–15)
BUN: 9 mg/dL (ref 8–23)
CO2: 21 mmol/L — ABNORMAL LOW (ref 22–32)
Calcium: 7.5 mg/dL — ABNORMAL LOW (ref 8.9–10.3)
Chloride: 108 mmol/L (ref 98–111)
Creatinine, Ser: 0.74 mg/dL (ref 0.44–1.00)
GFR, Estimated: >60 mL/min (ref >60)
Glucose, Bld: 165 mg/dL — ABNORMAL HIGH (ref 70–99)
Potassium: 3.4 mmol/L — ABNORMAL LOW (ref 3.5–5.1)
Sodium: 142 mmol/L (ref 135–145)

## 2023-10-15 LAB — CBC
HCT: 32 % — ABNORMAL LOW (ref 36.0–46.0)
Hemoglobin: 9.3 g/dL — ABNORMAL LOW (ref 12.0–15.0)
MCH: 31.3 pg (ref 26.0–34.0)
MCHC: 29.1 g/dL — ABNORMAL LOW (ref 30.0–36.0)
MCV: 107.7 fL — ABNORMAL HIGH (ref 80.0–100.0)
Platelets: 521 10*3/uL — ABNORMAL HIGH (ref 150–400)
RBC: 2.97 MIL/uL — ABNORMAL LOW (ref 3.87–5.11)
RDW: 13.8 % (ref 11.5–15.5)
WBC: 12.3 10*3/uL — ABNORMAL HIGH (ref 4.0–10.5)
nRBC: 0 % (ref 0.0–0.2)

## 2023-10-15 LAB — HEMOGLOBIN AND HEMATOCRIT, BLOOD
HCT: 31.4 % — ABNORMAL LOW (ref 36.0–46.0)
Hemoglobin: 9.2 g/dL — ABNORMAL LOW (ref 12.0–15.0)

## 2023-10-15 LAB — MAGNESIUM: Magnesium: 1.1 mg/dL — ABNORMAL LOW (ref 1.7–2.4)

## 2023-10-15 MED ORDER — MIDAZOLAM HCL 2 MG/2ML IJ SOLN
INTRAMUSCULAR | Status: AC
  Filled 2023-10-15: qty 4

## 2023-10-15 MED ORDER — MIDAZOLAM HCL 2 MG/2ML IJ SOLN
INTRAMUSCULAR | Status: AC | PRN
  Administered 2023-10-15: 1 mg via INTRAVENOUS

## 2023-10-15 MED ORDER — POTASSIUM CHLORIDE CRYS ER 20 MEQ PO TBCR
40.0000 meq | EXTENDED_RELEASE_TABLET | Freq: Once | ORAL | Status: AC
  Administered 2023-10-15: 40 meq via ORAL
  Filled 2023-10-15: qty 2

## 2023-10-15 MED ORDER — PANTOPRAZOLE SODIUM 40 MG IV SOLR
40.0000 mg | Freq: Two times a day (BID) | INTRAVENOUS | Status: DC
Start: 2023-10-15 — End: 2023-10-17
  Administered 2023-10-15 – 2023-10-17 (×4): 40 mg via INTRAVENOUS
  Filled 2023-10-15 (×4): qty 10

## 2023-10-15 MED ORDER — MAGNESIUM SULFATE 2 GM/50ML IV SOLN
2.0000 g | Freq: Once | INTRAVENOUS | Status: AC
  Administered 2023-10-15: 2 g via INTRAVENOUS
  Filled 2023-10-15: qty 50

## 2023-10-15 MED ORDER — LIDOCAINE HCL (PF) 1 % IJ SOLN
10.0000 mL | Freq: Once | INTRAMUSCULAR | Status: AC
  Administered 2023-10-15: 10 mL
  Filled 2023-10-15: qty 10

## 2023-10-15 MED ORDER — FENTANYL CITRATE (PF) 100 MCG/2ML IJ SOLN
INTRAMUSCULAR | Status: AC | PRN
  Administered 2023-10-15: 25 ug via INTRAVENOUS

## 2023-10-15 MED ORDER — VANCOMYCIN HCL IN DEXTROSE 1-5 GM/200ML-% IV SOLN
1000.0000 mg | INTRAVENOUS | Status: DC
  Administered 2023-10-15 – 2023-10-17 (×3): 1000 mg via INTRAVENOUS
  Filled 2023-10-15 (×3): qty 200

## 2023-10-15 MED ORDER — FENTANYL CITRATE (PF) 100 MCG/2ML IJ SOLN
INTRAMUSCULAR | Status: AC
  Filled 2023-10-15: qty 4

## 2023-10-15 NOTE — Progress Notes (Signed)
Patient left floor via [facility] to [facility] for a procedure. Patient left approx 0900.

## 2023-10-15 NOTE — Progress Notes (Signed)
Patient back to room 1355 from [facility]. Drain placed in RLQ.

## 2023-10-15 NOTE — Care Management Important Message (Signed)
Important Message  Patient Details  Name: [name] MRN: [id number] Date of Birth: 06/16/1951   Important Message Given:  Yes - Medicare IM     Corey Harold 10/15/2023, 2:35 PM

## 2023-10-15 NOTE — Progress Notes (Signed)
PROGRESS NOTE    FATE April  JXB:[id number] DOB: Feb 12, 1951 DOA: 10/13/2023 PCP: Benetta Spar, MD   Brief Narrative:    [name] is a 73 y.o. 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 Patient presented to the ED with complaints of right flank pain and swelling that started November.  She reports onset of drainage from her right flank yesterday.  Patient was admitted for sepsis, POA secondary to iliopsoas abscess and plan is for IR to place drain on 1/24.  Assessment & Plan:   Principal Problem:   Iliopsoas abscess (HCC) Active Problems:   Sepsis (HCC)   Hypokalemia   Chronic hypoxic respiratory failure (HCC)   Chronic systolic CHF (congestive heart failure) (HCC)   Essential hypertension   COPD GOLD ?  Assessment and Plan:   Sepsis, POA, secondary to iliopsoas abscess (HCC) Presenting with likely early sepsis, WBC-11.4, with tachycardia heart rate 95-104.  Tmax 98.7.  MRI abdomen with and without contrast 10/10/2023-  Multiple complex fluid collections in the right iliopsoas musculature and right pelvic sidewall, with fistulous tracts extending into the right flank musculature and subcutaneous fat of the right flank, as above, concerning for abscesses. -Continue IV antibiotics as prescribed with plans to keep n.p.o. after midnight -IR planning for drain placement 1/24 - HgbA1c 6.0%  Chronic systolic CHF (congestive heart failure) (HCC) Stable and compensated.  Last echo 03/2023 EF of 50%, showing improved EF from 35 to 40%. -In setting of early sepsis, hold torsemide 40 mg twice daily for now, resume when able -Start diet and hold further IV hydration -Hold Plavix -Resume metoprolol, losartan  Hypomagnesemia/hypokalemia -Replete and reevaluate in a.m.  COPD GOLD ? 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  Stable.  No wheezing.    DVT prophylaxis:  SCDs Code Status: Full Family Communication: None at bedside Disposition Plan: Plan for drain placement per IR 1/24 Status is: Inpatient Remains inpatient appropriate because: Need for IV medications.   Consultants:  IR  Procedures:  None  Antimicrobials:  Anti-infectives (From admission, onward)    Start     Dose/Rate Route Frequency Ordered Stop   10/14/23 2000  cefTRIAXone (ROCEPHIN) 2 g in sodium chloride 0.9 % 100 mL IVPB        2 g 200 mL/hr over 30 Minutes Intravenous Every 24 hours 10/14/23 1233     10/14/23 1800  vancomycin (VANCOREADY) IVPB 750 mg/150 mL        750 mg 150 mL/hr over 60 Minutes Intravenous Every 24 hours 10/13/23 1618     10/14/23 0400  aztreonam (AZACTAM) 2 g in sodium chloride 0.9 % 100 mL IVPB  Status:  Discontinued        2 g 200 mL/hr over 30 Minutes Intravenous Every 8 hours 10/13/23 1653 10/14/23 1233   10/13/23 1800  metroNIDAZOLE (FLAGYL) IVPB 500 mg        500 mg 100 mL/hr over 60 Minutes Intravenous Every 12 hours 10/13/23 1622     10/13/23 1630  vancomycin (VANCOREADY) IVPB 1500 mg/300 mL        1,500 mg 150 mL/hr over 120 Minutes Intravenous  Once 10/13/23 1617 10/13/23 2011   10/13/23 1630  aztreonam (AZACTAM) 2 g in sodium chloride 0.9 % 100 mL IVPB        2 g 200 mL/hr over 30 Minutes Intravenous  Once 10/13/23 1620 10/13/23 1740  Subjective: Patient seen and evaluated today with no new acute complaints or concerns. No acute concerns or events noted overnight.  Ready for procedure this morning.  Objective: Vitals:   10/14/23 0453 10/14/23 1405 10/14/23 2001 10/15/23 0346  BP: (!) 133/50 (!) 133/94 (!) 128/90 (!) 156/84  Pulse: 95 96    Resp: 18 19 20 20   Temp: 98.5 F (36.9 C) 98.2 F (36.8 C) 99 F (37.2 C) 98.7 F (37.1 C)  TempSrc: Oral     SpO2: 100% 100% 100% 100%  Weight:      Height:        Intake/Output Summary (Last 24 hours) at 10/15/2023 0920 Last data filed at 10/14/2023 1501 Gross per 24 hour   Intake 100 ml  Output --  Net 100 ml   Filed Weights   10/13/23 1522 10/13/23 2354  Weight: 63.5 kg 60.9 kg    Examination:  General exam: Appears calm and comfortable  Respiratory system: Clear to auscultation. Respiratory effort normal. Cardiovascular system: S1 & S2 heard, RRR.  Gastrointestinal system: Abdomen is soft Central nervous system: Alert and awake Extremities: No edema Skin: No significant lesions noted Psychiatry: Flat affect.    Data Reviewed: I have personally reviewed following labs and imaging studies  CBC: Recent Labs  Lab 10/13/23 1608 10/14/23 0216 10/15/23 0401  WBC 11.4* 15.8* 12.3*  HGB 9.8* 9.2* 9.3*  HCT 32.4* 30.5* 32.0*  MCV 105.9* 105.9* 107.7*  PLT 504* 487* 521*   Basic Metabolic Panel: Recent Labs  Lab 10/13/23 1608 10/14/23 0216 10/15/23 0401  NA 141 142 142  K 3.4* 3.5 3.4*  CL 99 103 108  CO2 28 25 21*  GLUCOSE 176* 111* 165*  BUN 15 15 9   CREATININE 0.92 0.83 0.74  CALCIUM 7.8* 7.4* 7.5*  MG 1.0*  --  1.1*   GFR: Estimated Creatinine Clearance: 50.4 mL/min (by C-G formula based on SCr of 0.74 mg/dL). Liver Function Tests: No results for input(s): "AST", "ALT", "ALKPHOS", "BILITOT", "PROT", "ALBUMIN" in the last 168 hours. No results for input(s): "LIPASE", "AMYLASE" in the last 168 hours. No results for input(s): "AMMONIA" in the last 168 hours. Coagulation Profile: No results for input(s): "INR", "PROTIME" in the last 168 hours. Cardiac Enzymes: No results for input(s): "CKTOTAL", "CKMB", "CKMBINDEX", "TROPONINI" in the last 168 hours. BNP (last 3 results) No results for input(s): "PROBNP" in the last 8760 hours. HbA1C: Recent Labs    10/13/23 1608  HGBA1C 6.0*   CBG: No results for input(s): "GLUCAP" in the last 168 hours. Lipid Profile: No results for input(s): "CHOL", "HDL", "LDLCALC", "TRIG", "CHOLHDL", "LDLDIRECT" in the last 72 hours. Thyroid Function Tests: No results for input(s): "TSH",  "T4TOTAL", "FREET4", "T3FREE", "THYROIDAB" in the last 72 hours. Anemia Panel: No results for input(s): "VITAMINB12", "FOLATE", "FERRITIN", "TIBC", "IRON", "RETICCTPCT" in the last 72 hours. Sepsis Labs: Recent Labs  Lab 10/13/23 2135 10/14/23 0216  LATICACIDVEN 2.1* 2.7*    Recent Results (from the past 240 hours)  Blood culture (routine x 2)     Status: None (Preliminary result)   Collection Time: 10/13/23  4:54 PM   Specimen: BLOOD LEFT WRIST  Result Value Ref Range Status   Specimen Description BLOOD LEFT WRIST  Final   Special Requests   Final    BOTTLES DRAWN AEROBIC AND ANAEROBIC Blood Culture adequate volume   Culture   Final    NO GROWTH 2 DAYS Performed at [facility], [address], [place], [place] [address]  Report Status PENDING  Incomplete  Blood culture (routine x 2)     Status: None (Preliminary result)   Collection Time: 10/13/23  4:54 PM   Specimen: BLOOD LEFT WRIST  Result Value Ref Range Status   Specimen Description BLOOD LEFT WRIST  Final   Special Requests   Final    BOTTLES DRAWN AEROBIC AND ANAEROBIC Blood Culture adequate volume   Culture   Final    NO GROWTH 2 DAYS Performed at [facility], [address], [place], [place] [address]    Report Status PENDING  Incomplete         Radiology Studies: No results found.      Scheduled Meds:  losartan  12.5 mg Oral Daily   metoprolol succinate  50 mg Oral Daily   mirtazapine  7.5 mg Oral QHS   montelukast  10 mg Oral QHS   Continuous Infusions:  cefTRIAXone (ROCEPHIN)  IV 2 g (10/14/23 2022)   magnesium sulfate bolus IVPB 2 g (10/15/23 0825)   metronidazole 500 mg (10/15/23 1914)   vancomycin 750 mg (10/14/23 1729)     LOS: 2 days    Time spent: 55 minutes    Kynlie Jane D Sherryll Burger, DO [facility]  If 7PM-7AM, please contact night-coverage [email or web address] 10/15/2023, 9:20 AM

## 2023-10-15 NOTE — Procedures (Signed)
Pre procedural Dx: Right lower abdominal/retroperitoneal abscess Post procedural Dx: Same  Technically successful CT guided placed of a 10 Fr drainage catheter placement into the right lower abdominal abscess yielding 30 cc of purulent fluid.   A representative aspirated sample was capped and sent to the laboratory for analysis.   Drain connected to gravity bag.  EBL: Trace Complications: None immediate  Katherina Right, MD Pager #: [phone]

## 2023-10-15 NOTE — Progress Notes (Signed)
Pharmacy Antibiotic Note  [name] is a 73 y.o. female admitted on 10/13/2023 with  iliopsoas abscess .  Pharmacy has been consulted for vancomycin and zosyn dosing.  Update 1/24: Plan is to go to IR for drain placement today.  Plan: Day #3 of antibiotics Adjust vancomycin to 1000 mg IV q24H due to improved renal function eAUC 554, Cmax 36, Cmin 14 Scr 0.8, IBW, Vd 0.72 L/kg Follow up abscess cultures taken in IR, as applicable Continue to monitor renal function   Height: 5' (152.4 cm) Weight: 60.9 kg (134 lb 4.2 oz) IBW/kg (Calculated) : 45.5  Temp (24hrs), Avg:98.6 F (37 C), Min:98.2 F (36.8 C), Max:99 F (37.2 C)  Recent Labs  Lab 10/13/23 1608 10/13/23 2135 10/14/23 0216 10/15/23 0401  WBC 11.4*  --  15.8* 12.3*  CREATININE 0.92  --  0.83 0.74  LATICACIDVEN  --  2.1* 2.7*  --     Estimated Creatinine Clearance: 50.4 mL/min (by C-G formula based on SCr of 0.74 mg/dL).    Allergies  Allergen Reactions   Aspirin Swelling and Other (See Comments)    Only in large doses will cause a reaction   Bee Venom Swelling    Severe swelling   Contrast Media [Iodinated Contrast Media] Swelling   Lisinopril Swelling   Motrin [Ibuprofen] Swelling   Penicillins Anaphylaxis   Dilaudid [Hydromorphone Hcl] Itching   Thank you for allowing pharmacy to be a part of this patient's care.  Will M. Dareen Piano, PharmD Clinical Pharmacist 10/15/2023 12:06 PM

## 2023-10-15 NOTE — Plan of Care (Signed)
Problem: Education: Goal: Knowledge of General Education information will improve Description: Including pain rating scale, medication(s)/side effects and non-pharmacologic comfort measures Outcome: Progressing   Problem: Clinical Measurements: Goal: Ability to maintain clinical measurements within normal limits will improve Outcome: Progressing   Problem: Clinical Measurements: Goal: Respiratory complications will improve Outcome: Progressing

## 2023-10-15 NOTE — Consult Note (Signed)
Chief Complaint: Intra abd- abdominal abscess. Request is for intra abdominal abscess drain placement.    Referring Physician(s): Dr. Maurilio Lovely  Supervising Physician: Simonne Come  Patient Status:  [facility] - Inpatient  History of Present Illness: [name] is a 73 y.o. female 73 y.o. female inpatient ( [facility]). History of HTN, HLD, CVA, COPD (on 3L of O2). Found to have a palpable  right flank  mass and pain since November 2024. Found to be a complex fluid collection in the  right retroperitoneum with additional irregular collection or mass abutting the right psoas and iliacus muscles on CT Abd pelvis from 1.14.24. MR Abdomen from 1.19.25 reads  Multiple complex fluid collections in the right iliopsoas musculature and right pelvic sidewall, with fistulous tracts extending into the right flank musculature and subcutaneous fat of the right flank, as above, concerning for abscesses. Patient presented to the ED at [facility] on. 1.22.25 Found to  be in early stages of sepsis secondary to a iliopsoas abscess.  Team is requesting an abscess drain placement. Case reviewed and approved by IR Attending Dr. Malachy Moan.   Endorses right sided abdominal pain.  Patient alert and laying in bed,calm. Denies any fevers, headache, chest pain, SOB, cough, nausea, vomiting or bleeding.   Lactic Acid 2.7, WBC 12.3< 15.8. All other labs and medications are within acceptable parameters. Allergies include contrast media reaction swelling.   Return precautions and treatment recommendations and follow-up discussed with the patient  who is agreeable with the plan.   Past Medical History:  Diagnosis Date   Anxiety    Blind    right   Colitis 09/07/2011   CVA (cerebral infarction)    GERD (gastroesophageal reflux disease)    Hyperlipidemia    Hypertension    Myocardial infarct (HCC)    1997, 2004   Severe major depression with psychotic features (HCC)    2004   Stroke [facility]) 2002    Past Surgical  History:  Procedure Laterality Date   ABDOMINAL HYSTERECTOMY     BILATERAL SALPINGOOPHORECTOMY     BIOPSY  05/08/2022   Procedure: BIOPSY;  Surgeon: Dolores Frame, MD;  Location: [facility];  Service: Gastroenterology;;   CHOLECYSTECTOMY N/A 10/15/2021   Procedure: LAPAROSCOPIC CHOLECYSTECTOMY WITH INTRAOPERATIVE CHOLANGIOGRAM;  Surgeon: Franky Macho, MD;  Location: [facility] ORS;  Service: General;  Laterality: N/A;   COLONOSCOPY N/A 07/17/2014   RMR: Extremely redundant colon. Colonic diverticulosis. Inadequate prepartation precluded examination of all the rectal and colonic.  I suspect slow colonic transit in the setting of a markely redundant colon.   CORONARY ANGIOPLASTY WITH STENT PLACEMENT  [id number]   cyst removed from left wrist     ERCP N/A 10/10/2021   Procedure: ENDOSCOPIC RETROGRADE CHOLANGIOPANCREATOGRAPHY (ERCP);  Surgeon: Malissa Hippo, MD;  Location: [facility] OR[facility];  Service: Gastroenterology;  Laterality: N/A;   ERCP N/A 10/13/2021   Procedure: ENDOSCOPIC RETROGRADE CHOLANGIOPANCREATOGRAPHY (ERCP);  Surgeon: Malissa Hippo, MD;  Location: [facility] OR[facility];  Service: Gastroenterology;  Laterality: N/A;   ESOPHAGEAL DILATION  05/08/2022   Procedure: ESOPHAGEAL DILATION;  Surgeon: Marguerita Merles, Reuel Boom, MD;  Location: [facility];  Service: Gastroenterology;;  savory dilation'   ESOPHAGOGASTRODUODENOSCOPY  09/05/2004   Dr Jimmye Norman gastritis, candida esophagitis   ESOPHAGOGASTRODUODENOSCOPY  09/07/2011   NFA:OZHY, esophageal in the distal esophagus/Mild gastritis   ESOPHAGOGASTRODUODENOSCOPY (EGD) WITH PROPOFOL N/A 05/08/2022   Procedure: ESOPHAGOGASTRODUODENOSCOPY (EGD) WITH PROPOFOL;  Surgeon: Dolores Frame, MD;  Location: [facility];  Service: Gastroenterology;  Laterality: N/A;  with possible esophageal dilation   Lumps removed from each breast     SPHINCTEROTOMY N/A 10/10/2021   Procedure: SPHINCTEROTOMY INCOMPLETE, STONE NOT REMOVED;  Surgeon: Malissa Hippo, MD;  Location: [facility];  Service: Gastroenterology;  Laterality: N/A;    Allergies: Aspirin, Bee venom, Contrast media [iodinated contrast media], Lisinopril, Motrin [ibuprofen], Penicillins, and Dilaudid [hydromorphone hcl]  Medications: Prior to Admission medications   Medication Sig Start Date End Date Taking? Authorizing Provider  acetaminophen (TYLENOL) 650 MG CR tablet Take 1,300 mg by mouth every 8 (eight) hours as needed for pain.   Yes [provider]  clopidogrel (PLAVIX) 75 MG tablet Take 1 tablet (75 mg total) by mouth daily. 06/17/22  Yes Leroy Sea, MD  folic acid (FOLVITE) 1 MG tablet Take 1 tablet (1 mg total) by mouth daily. 10/17/21  Yes Tat, Onalee Hua, MD  Ipratropium-Albuterol (COMBIVENT) 20-100 MCG/ACT AERS respimat Inhale 1 puff into the lungs every 6 (six) hours as needed for wheezing or shortness of breath. 05/09/22  Yes Vassie Loll, MD  losartan (COZAAR) 25 MG tablet Take 12.5 mg by mouth daily. 10/08/23  Yes [provider]  metoprolol succinate (TOPROL XL) 50 MG 24 hr tablet Take 1 tablet (50 mg total) by mouth daily. Take with or immediately following a meal. 03/30/23  Yes Mallipeddi, Vishnu P, MD  mirtazapine (REMERON) 7.5 MG tablet Take 7.5 mg by mouth at bedtime. 04/27/22  Yes [provider]  montelukast (SINGULAIR) 10 MG tablet Take 10 mg by mouth at bedtime.   Yes [provider]  torsemide (DEMADEX) 20 MG tablet Take 40 mg by mouth 2 (two) times daily.   Yes [provider]  vitamin B-12 (CYANOCOBALAMIN) 500 MCG tablet Take 1 tablet (500 mcg total) by mouth daily. 10/16/21  Yes TatOnalee Hua, MD     Family History  Problem Relation Age of Onset   Multiple sclerosis Father    Alzheimer's disease Mother    Hypertension Mother    Diabetes Mother    Colon cancer Neg Hx     Social History   Socioeconomic History   Marital status: Widowed    Spouse name: Not on file   Number of children: 3   Years of  education: Not on file   Highest education level: Not on file  Occupational History   Occupation: disabled, Best boy  Tobacco Use   Smoking status: Former    Current packs/day: 0.00    Average packs/day: 0.3 packs/day for 40.0 years (10.0 ttl pk-yrs)    Types: Cigarettes    Start date: 08/02/1977    Quit date: 08/02/2017    Years since quitting: 6.2   Smokeless tobacco: Never  Vaping Use   Vaping status: Never Used  Substance and Sexual Activity   Alcohol use: No    Alcohol/week: 0.0 standard drinks of alcohol   Drug use: Yes    Types: Marijuana   Sexual activity: Not Currently    Birth control/protection: Post-menopausal  Other Topics Concern   Not on file  Social History Narrative   Lives alone   Social Drivers of Health   Financial Resource Strain: Not on file  Food Insecurity: No Food Insecurity (10/13/2023)   Hunger Vital Sign    Worried About Running Out of Food in the Last Year: Never true    Ran Out of Food in the Last Year: Never true  Transportation Needs: No Transportation Needs (10/13/2023)   PRAPARE - Transportation  Lack of Transportation (Medical): No    Lack of Transportation (Non-Medical): No  Physical Activity: Not on file  Stress: Not on file  Social Connections: Unknown (10/13/2023)   Social Connection and Isolation Panel [NHANES]    Frequency of Communication with Friends and Family: More than three times a week    Frequency of Social Gatherings with Friends and Family: Twice a week    Attends Religious Services: More than 4 times per year    Active Member of [employer] or Organizations: No    Attends Engineer, structural: Patient declined    Marital Status: Patient declined     Review of Systems: A 12 point ROS discussed and pertinent positives are indicated in the HPI above.  All other systems are negative.  Review of Systems  Constitutional:  Negative for fatigue and fever.  HENT:  Negative for congestion.   Respiratory:  Negative  for cough and shortness of breath.   Gastrointestinal:  Positive for abdominal pain. Negative for diarrhea, nausea and vomiting.    Vital Signs: BP (!) 156/84 (BP Location: Right Arm)   Pulse 96   Temp 98.7 F (37.1 C)   Resp 20   Ht 5' (1.524 m)   Wt 134 lb 4.2 oz (60.9 kg)   SpO2 100%   BMI 26.22 kg/m   Advance Care Plan: The advanced care plan/surrogate decision maker was discussed at the time of visit and documented in the medical record.  son   Physical Exam Vitals and nursing note reviewed.  Constitutional:      Appearance: She is well-developed.  HENT:     Head: Normocephalic and atraumatic.     Mouth/Throat:     Mouth: Mucous membranes are dry.  Eyes:     Conjunctiva/sclera: Conjunctivae normal.  Cardiovascular:     Rate and Rhythm: Normal rate and regular rhythm.  Pulmonary:     Effort: Pulmonary effort is normal.  Musculoskeletal:        General: Normal range of motion.     Cervical back: Normal range of motion.  Skin:    General: Skin is warm and dry.  Neurological:     General: No focal deficit present.     Mental Status: She is alert and oriented to person, place, and time. Mental status is at baseline.  Psychiatric:        Mood and Affect: Mood normal.        Behavior: Behavior normal.        Thought Content: Thought content normal.        Judgment: Judgment normal.     Imaging: MR ABDOMEN WWO CONTRAST Result Date: 10/10/2023 CLINICAL DATA:  73 year old female with history of palpable abdominal mass in the right back and low flank. EXAM: MRI ABDOMEN WITHOUT AND WITH CONTRAST TECHNIQUE: Multiplanar multisequence MR imaging of the abdomen was performed both before and after the administration of intravenous contrast. CONTRAST:  6.25mL GADAVIST GADOBUTROL 1 MMOL/ML IV SOLN COMPARISON:  Abdominal MRI 10/14/2021. CT of the abdomen and pelvis 10/05/2023. FINDINGS: Lower chest: Cardiomegaly. Hepatobiliary: Well-circumscribed T1 hypointense, T2 hyperintense  nonenhancing lesions are noted in the liver measuring up to 1 cm, compatible with tiny cysts and/or biliary hamartomas (no imaging follow-up recommended). No other aggressive appearing hepatic lesions are noted. No intra or extrahepatic biliary ductal dilatation. Status post cholecystectomy. Pancreas: No pancreatic mass. No pancreatic ductal dilatation. No pancreatic or peripancreatic fluid collections or inflammatory changes. Spleen:  Unremarkable. Adrenals/Urinary Tract: Numerous T1 hypointense, T2  hyperintense, nonenhancing lesions in both kidneys compatible with simple (Bosniak class 1) cysts which require no imaging follow up, measuring up to 4.2 cm extending exophytically from the upper pole of the left kidney. No aggressive appearing renal lesions are otherwise noted. No hydroureteronephrosis in the visualized portions of the abdomen. Bilateral adrenal glands are normal in appearance. Stomach/Bowel: Visualized portions of the stomach, small bowel and colon are grossly unremarkable in appearance. Vascular/Lymphatic: Aortic atherosclerosis without definite aneurysm in the abdominal vasculature. No definite lymphadenopathy confidently identified in the abdomen. Other: No significant volume of ascites noted in the visualized portions of the peritoneal cavity. Musculoskeletal: Along the right iliopsoas musculature and the right pelvic sidewall there are complex centrally T1 hypointense and T2 hyperintense peripherally rim enhancing fluid collections with multiple internal septations, suspicious for abscesses. The largest of these is centered in the lateral aspect of the right psoas muscle (axial image 76 of series 28 and coronal image 73 of series 25) estimated to measure proximally 5.3 x 3.1 x 4.9 cm. These appear to communicate into the right flank musculature and into the overlying subcutaneous fat of the right flank via fistulous tracts. The largest collection in the subcutaneous fat of the right flank is  estimated to measure at least 10.1 x 4.5 x 7.4 cm (axial image 60 of series 28 and coronal image 105 of series 25). No aggressive appearing osseous lesions are noted in the visualized portions of the skeleton. IMPRESSION: 1. Multiple complex fluid collections in the right iliopsoas musculature and right pelvic sidewall, with fistulous tracts extending into the right flank musculature and subcutaneous fat of the right flank, as above, concerning for abscesses. 2. Cardiomegaly. 3. Aortic atherosclerosis. Electronically Signed   By: Trudie Reed M.D.   On: 10/10/2023 12:15   CT ABDOMEN PELVIS WO CONTRAST Result Date: 10/05/2023 CLINICAL DATA:  Palpable mass right low back/flank EXAM: CT ABDOMEN AND PELVIS WITHOUT CONTRAST TECHNIQUE: Multidetector CT imaging of the abdomen and pelvis was performed following the standard protocol without IV contrast. RADIATION DOSE REDUCTION: This exam was performed according to the departmental dose-optimization program which includes automated exposure control, adjustment of the mA and/or kV according to patient size and/or use of iterative reconstruction technique. COMPARISON:  CT 05/02/2022 FINDINGS: Lower chest: Lung bases demonstrate emphysema. No acute airspace disease. Coronary vascular calcification. Asymmetric row right subareolar breast density on series 2, image 4. Hepatobiliary: Stable hypodensity in the left hepatic lobe, previously characterized by MRI. Cholecystectomy. No biliary dilatation Pancreas: Unremarkable. No pancreatic ductal dilatation or surrounding inflammatory changes. Spleen: Normal in size without focal abnormality. Adrenals/Urinary Tract: Adrenal glands are normal. Kidneys show no hydronephrosis. Left renal cysts for which no imaging follow-up is recommended. The bladder is normal Stomach/Bowel: The stomach is nonenlarged. There is no dilated small bowel. No acute bowel wall thickening Vascular/Lymphatic: Advanced aortic atherosclerosis. No  aneurysm. No suspicious lymph nodes. Reproductive: Hysterectomy.  No adnexal mass Other: Negative for pelvic effusion or free air. Small indeterminate soft tissue nodule measuring 14 mm on series 2, image 37, adjacent to the ascending colon. Musculoskeletal: No acute fracture is seen. Right flank heterogenous collection containing fluid and some fat density. This measures roughly 4.6 cm AP by 11.4 cm oblique craniocaudal by 10.7 cm transverse, and extends to the skin surface where there is overlying skin thickening. This is contiguous with irregular density extending into the right retroperitoneum and contiguous with irregular collection abutting the right psoas and iliacus muscles. Collection or mass abutting the right psoas also  has some internal fat density. IMPRESSION: 1. Right flank heterogenous collection containing fluid and some fat density measuring up to 11.4 cm, extends to the skin surface and is contiguous with irregular density extending into the right retroperitoneum with additional irregular collection or mass abutting the right psoas and iliacus muscles. Differential considerations include organizing hematoma from prior trauma, possibly with component of fat necrosis. Underlying soft tissue neoplasm complicated by hemorrhage is also a consideration as patient gives no history of trauma to the area. Further evaluation with MRI with and without contrast is suggested for further assessment. 2. Small indeterminate soft tissue nodule adjacent to the ascending colon measuring 14 mm. The irregular appearance is somewhat suspicious for a metastatic focus. 3. Asymmetric right subareolar breast density, recommend correlation with outpatient mammography and possible ultrasound. 4. Aortic atherosclerosis. Aortic Atherosclerosis (ICD10-I70.0) and Emphysema (ICD10-J43.9). Electronically Signed   By: Jasmine Pang M.D.   On: 10/05/2023 16:38    Labs:  CBC: Recent Labs    10/05/23 1300 10/13/23 1608  10/14/23 0216 10/15/23 0401  WBC 12.9* 11.4* 15.8* 12.3*  HGB 8.8* 9.8* 9.2* 9.3*  HCT 28.8* 32.4* 30.5* 32.0*  PLT 457* 504* 487* 521*    COAGS: No results for input(s): "INR", "APTT" in the last 8760 hours.  BMP: Recent Labs    10/05/23 1300 10/13/23 1608 10/14/23 0216 10/15/23 0401  NA 140 141 142 142  K 3.6 3.4* 3.5 3.4*  CL 101 99 103 108  CO2 27 28 25  21*  GLUCOSE 139* 176* 111* 165*  BUN 12 15 15 9   CALCIUM 7.2* 7.8* 7.4* 7.5*  CREATININE 0.98 0.92 0.83 0.74  GFRNONAA >60 >60 >60 >60    LIVER FUNCTION TESTS: Recent Labs    10/05/23 1300  BILITOT 0.8  AST 14*  ALT 8  ALKPHOS 185*  PROT 7.5  ALBUMIN 2.6*     Assessment and Plan:  73 y.o. female inpatient ( [facility]). History of HTN, HLD, CVA, COPD (on 3L of O2). Found to have a palpable  right flank  mass and pain since November 2024. Found to be a complex fluid collection in the  right retroperitoneum with additional irregular collection or mass abutting the right psoas and iliacus muscles on CT Abd pelvis from 1.14.24. MR Abdomen from 1.19.25 reads  Multiple complex fluid collections in the right iliopsoas musculature and right pelvic sidewall, with fistulous tracts extending into the right flank musculature and subcutaneous fat of the right flank, as above, concerning for abscesses. Patient presented to the ED at [facility] on. 1.22.25 Found to  be in early stages of sepsis secondary to a iliopsoas abscess.  Team is requesting an abscess drain placement. Case reviewed and approved by IR Attending Dr. Malachy Moan.    PLAN: IR Image Guided Abscess Drain Placement  Risks and benefits discussed with the patient including bleeding, infection, damage to adjacent structures, bowel perforation/fistula connection, and sepsis.  All of the patient's questions were answered, patient is agreeable to proceed. Consent signed and in chart.   Thank you for this interesting consult.  I greatly enjoyed meeting [name]  and look forward to participating in their care.  A copy of this report was sent to the requesting provider on this date.  Electronically Signed: Alene Mires, NP 10/15/2023, 7:20 AM   I spent a total of 20 Minutes    in face to face in clinical consultation, greater than 50% of which was counseling/coordinating care for intra abdominal abscess drain  placement

## 2023-10-15 NOTE — Plan of Care (Signed)
Problem: Education: Goal: Knowledge of General Education information will improve Description: Including pain rating scale, medication(s)/side effects and non-pharmacologic comfort measures Outcome: Progressing   Problem: Health Behavior/Discharge Planning: Goal: Ability to manage health-related needs will improve Outcome: Progressing   Problem: Clinical Measurements: Goal: Will remain free from infection Outcome: Progressing

## 2023-10-16 DIAGNOSIS — K625 Hemorrhage of anus and rectum: Secondary | ICD-10-CM | POA: Diagnosis not present

## 2023-10-16 DIAGNOSIS — K6812 Psoas muscle abscess: Secondary | ICD-10-CM | POA: Diagnosis not present

## 2023-10-16 LAB — CBC
HCT: 28.3 % — ABNORMAL LOW (ref 36.0–46.0)
Hemoglobin: 8.1 g/dL — ABNORMAL LOW (ref 12.0–15.0)
MCH: 30.6 pg (ref 26.0–34.0)
MCHC: 28.6 g/dL — ABNORMAL LOW (ref 30.0–36.0)
MCV: 106.8 fL — ABNORMAL HIGH (ref 80.0–100.0)
Platelets: 439 10*3/uL — ABNORMAL HIGH (ref 150–400)
RBC: 2.65 MIL/uL — ABNORMAL LOW (ref 3.87–5.11)
RDW: 13.8 % (ref 11.5–15.5)
WBC: 12.4 10*3/uL — ABNORMAL HIGH (ref 4.0–10.5)
nRBC: 0 % (ref 0.0–0.2)

## 2023-10-16 LAB — BASIC METABOLIC PANEL
Anion gap: 7 (ref 5–15)
BUN: 5 mg/dL — ABNORMAL LOW (ref 8–23)
CO2: 25 mmol/L (ref 22–32)
Calcium: 8.1 mg/dL — ABNORMAL LOW (ref 8.9–10.3)
Chloride: 111 mmol/L (ref 98–111)
Creatinine, Ser: 0.65 mg/dL (ref 0.44–1.00)
GFR, Estimated: >60 mL/min (ref >60)
Glucose, Bld: 154 mg/dL — ABNORMAL HIGH (ref 70–99)
Potassium: 3.9 mmol/L (ref 3.5–5.1)
Sodium: 143 mmol/L (ref 135–145)

## 2023-10-16 LAB — HEMOGLOBIN AND HEMATOCRIT, BLOOD
HCT: 28.2 % — ABNORMAL LOW (ref 36.0–46.0)
Hemoglobin: 8.4 g/dL — ABNORMAL LOW (ref 12.0–15.0)

## 2023-10-16 LAB — MAGNESIUM: Magnesium: 1.8 mg/dL (ref 1.7–2.4)

## 2023-10-16 MED ORDER — IPRATROPIUM-ALBUTEROL 0.5-2.5 (3) MG/3ML IN SOLN
3.0000 mL | RESPIRATORY_TRACT | Status: DC | PRN
  Administered 2023-10-16 – 2023-10-19 (×3): 3 mL via RESPIRATORY_TRACT
  Filled 2023-10-16 (×3): qty 3

## 2023-10-16 MED ORDER — TORSEMIDE 20 MG PO TABS
40.0000 mg | ORAL_TABLET | Freq: Two times a day (BID) | ORAL | Status: DC
  Administered 2023-10-16 – 2023-10-18 (×5): 40 mg via ORAL
  Filled 2023-10-16 (×6): qty 2

## 2023-10-16 MED ORDER — POLYETHYLENE GLYCOL 3350 17 G PO PACK
17.0000 g | PACK | Freq: Two times a day (BID) | ORAL | Status: DC
  Administered 2023-10-16 – 2023-10-17 (×3): 17 g via ORAL
  Filled 2023-10-16 (×6): qty 1

## 2023-10-16 MED ORDER — OXYCODONE HCL 5 MG PO TABS
5.0000 mg | ORAL_TABLET | Freq: Four times a day (QID) | ORAL | Status: DC | PRN
  Administered 2023-10-16 – 2023-10-19 (×5): 5 mg via ORAL
  Filled 2023-10-16 (×5): qty 1

## 2023-10-16 NOTE — Plan of Care (Signed)

## 2023-10-16 NOTE — Progress Notes (Addendum)
MEWS Progress Note  Patient Details Name: [name] MRN: [id number] DOB: 03-19-1951 Today's Date: 10/16/2023   MEWS Flowsheet Documentation:  Assess: MEWS Score Temp: 98.5 F (36.9 C) BP: (!) 117/54 MAP (mmHg): 71 Pulse Rate: (!) 112 ECG Heart Rate: 96 Resp: 20 Level of Consciousness: Alert SpO2: 100 % O2 Device: Nasal Cannula O2 Flow Rate (L/min): 4.5 L/min Assess: MEWS Score MEWS Temp: 0 MEWS Systolic: 0 MEWS Pulse: 2 MEWS RR: 0 MEWS LOC: 0 MEWS Score: 2 MEWS Score Color: Yellow Assess: SIRS CRITERIA SIRS Temperature : 0 SIRS Respirations : 0 SIRS Pulse: 1 SIRS WBC: 0 SIRS Score Sum : 1 SIRS Temperature : 0 SIRS Pulse: 1 SIRS Respirations : 0 SIRS WBC: 0 SIRS Score Sum : 1 Assess: if the MEWS score is Yellow or Red Were vital signs accurate and taken at a resting state?: No, vital signs rechecked Does the patient meet 2 or more of the SIRS criteria?: No    Patient up to bathroom and walking around room and talking with her son.    Karolee Ohs 10/16/2023, 9:28 PM

## 2023-10-16 NOTE — Consult Note (Signed)
Katrinka Blazing, M.D. Gastroenterology & Hepatology                                           Patient Name: [name] Account #: @FLAACCTNO @   MRN: [id number] Admission Date: 10/13/2023 Date of Evaluation:  10/16/2023 Time of Evaluation: 11:15 AM   Referring Physician: Maurilio Lovely, DO  Chief Complaint: Rectal bleeding  HPI:  This is a 73 y.o. female with history of anxiety, CVA, GERD, hyperlipidemia, hypertension, history of myocardial infarction, COPD on oxygen 3 L/min, who was admitted to the hospital after presenting worsening right flank pain.  Patient was found to have sepsis secondary to iliopsoas abscess and underwent drain placement by interventional radiology on 10/15/2023.  Gastroenterology was consulted for evaluation of rectal bleeding.  Patient reports that she was feeling well after she underwent her iliopsoas abscess drainage.  Etiology of abscess appears to be secondary to Possible perforated appendicitis versus  less likely dropped gallstone.  She has continue antibiotic coverage.  Notably, the patient reported she was having constipation since admission and was not able to move her bowels yesterday.  States that she received 1 dose of MiraLAX and this was followed by  a bowel movement that was very hard and 2 episodes of rectal bleeding.  She states that she had to strain to have a bowel movement at that time.  Reports that she does not usually have any issues in her constipation is new. states she has chronic abdominal pain issues intermittently but no new episodes of pain since she had the episode of rectal bleeding.  The patient denies having any nausea, vomiting, fever, chills, melena, hematemesis, abdominal distention, diarrhea, jaundice, pruritus or weight loss.    She does not take any NSAIDs.  She takes Plavix for stroke but no other   Anticoagulants.  Labs from today showed a BUN of 5, creatinine 0.65, normal electrolytes, hemoglobin was 8.1 with repeat of 8.4,  WBC 12.4 and platelets 439.  Last EGD 05/08/2022:  - Esophageal plaques were found, suspicious for candidiasis. Biopsied. - Benign- appearing esophageal stenoses. Dilated. - LA Grade A esophagitis with no bleeding. - Normal stomach. - Normal examined duodenum.  Most recent colonoscopy 07/17/2014 - Extremely redundant colon.  Colonic diverticulosis.  Inadequate preparation precluded examination of all the rectal and colonic mucosal surfaces. No evidence of stricture or gross colonic neoplasm. I suspect slow colonic transit in the setting of a markedly redundant colon.  She was recommended to have a repeat colonoscopy 6 months later but she never scheduled this.  Past Medical History: SEE CHRONIC ISSSUES: Past Medical History:  Diagnosis Date   Anxiety    Blind    right   Colitis 09/07/2011   CVA (cerebral infarction)    GERD (gastroesophageal reflux disease)    Hyperlipidemia    Hypertension    Myocardial infarct (HCC)    1997, 2004   Severe major depression with psychotic features (HCC)    2004   Stroke [facility]) 2002   Past Surgical History:  Past Surgical History:  Procedure Laterality Date   ABDOMINAL HYSTERECTOMY     BILATERAL SALPINGOOPHORECTOMY     BIOPSY  05/08/2022   Procedure: BIOPSY;  Surgeon: Dolores Frame, MD;  Location: [facility];  Service: Gastroenterology;;   CHOLECYSTECTOMY N/A 10/15/2021   Procedure: LAPAROSCOPIC CHOLECYSTECTOMY WITH INTRAOPERATIVE CHOLANGIOGRAM;  Surgeon: Franky Macho, MD;  Location: [facility];  Service: General;  Laterality: N/A;   COLONOSCOPY N/A 07/17/2014   RMR: Extremely redundant colon. Colonic diverticulosis. Inadequate prepartation precluded examination of all the rectal and colonic.  I suspect slow colonic transit in the setting of a markely redundant colon.   CORONARY ANGIOPLASTY WITH STENT PLACEMENT  [id number]   cyst removed from left wrist     ERCP N/A 10/10/2021   Procedure: ENDOSCOPIC RETROGRADE  CHOLANGIOPANCREATOGRAPHY (ERCP);  Surgeon: Malissa Hippo, MD;  Location: [facility];  Service: Gastroenterology;  Laterality: N/A;   ERCP N/A 10/13/2021   Procedure: ENDOSCOPIC RETROGRADE CHOLANGIOPANCREATOGRAPHY (ERCP);  Surgeon: Malissa Hippo, MD;  Location: [facility];  Service: Gastroenterology;  Laterality: N/A;   ESOPHAGEAL DILATION  05/08/2022   Procedure: ESOPHAGEAL DILATION;  Surgeon: Marguerita Merles, Reuel Boom, MD;  Location: [facility];  Service: Gastroenterology;;  savory dilation'   ESOPHAGOGASTRODUODENOSCOPY  09/05/2004   Dr Jimmye Norman gastritis, candida esophagitis   ESOPHAGOGASTRODUODENOSCOPY  09/07/2011   VWU:[id number], esophageal in the distal esophagus/Mild gastritis   ESOPHAGOGASTRODUODENOSCOPY (EGD) WITH PROPOFOL N/A 05/08/2022   Procedure: ESOPHAGOGASTRODUODENOSCOPY (EGD) WITH PROPOFOL;  Surgeon: Dolores Frame, MD;  [facility]: [facility];  Service: Gastroenterology;  Laterality: N/A;  with possible esophageal dilation   Lumps removed from each breast     SPHINCTEROTOMY N/A 10/10/2021   Procedure: SPHINCTEROTOMY INCOMPLETE, STONE NOT REMOVED;  Surgeon: Malissa Hippo, MD;  Location: [facility];  Service: Gastroenterology;  Laterality: N/A;   Family History:  Family History  Problem Relation Age of Onset   Multiple sclerosis Father    Alzheimer's disease Mother    Hypertension Mother    Diabetes Mother    Colon cancer Neg Hx    Social History:  Social History   Tobacco Use   Smoking status: Former    Current packs/day: 0.00    Average packs/day: 0.3 packs/day for 40.0 years (10.0 ttl pk-yrs)    Types: Cigarettes    Start date: 08/02/1977    Quit date: 08/02/2017    Years since quitting: 6.2   Smokeless tobacco: Never  Vaping Use   Vaping status: Never Used  Substance Use Topics   Alcohol use: No    Alcohol/week: 0.0 standard drinks of alcohol   Drug use: Yes    Types: Marijuana    Home Medications:  Prior to Admission medications   Medication  Sig Start Date End Date Taking? Authorizing Provider  acetaminophen (TYLENOL) 650 MG CR tablet Take 1,300 mg by mouth every 8 (eight) hours as needed for pain.   Yes [provider]  clopidogrel (PLAVIX) 75 MG tablet Take 1 tablet (75 mg total) by mouth daily. 06/17/22  Yes Leroy Sea, MD  folic acid (FOLVITE) 1 MG tablet Take 1 tablet (1 mg total) by mouth daily. 10/17/21  Yes Tat, Onalee Hua, MD  Ipratropium-Albuterol (COMBIVENT) 20-100 MCG/ACT AERS respimat Inhale 1 puff into the lungs every 6 (six) hours as needed for wheezing or shortness of breath. 05/09/22  Yes Vassie Loll, MD  losartan (COZAAR) 25 MG tablet Take 12.5 mg by mouth daily. 10/08/23  Yes [provider]  metoprolol succinate (TOPROL XL) 50 MG 24 hr tablet Take 1 tablet (50 mg total) by mouth daily. Take with or immediately following a meal. 03/30/23  Yes Mallipeddi, Vishnu P, MD  mirtazapine (REMERON) 7.5 MG tablet Take 7.5 mg by mouth at bedtime. 04/27/22  Yes [provider]  montelukast (SINGULAIR) 10 MG tablet Take 10 mg by mouth at bedtime.  Yes [provider]  torsemide (DEMADEX) 20 MG tablet Take 40 mg by mouth 2 (two) times daily.   Yes [provider]  vitamin B-12 (CYANOCOBALAMIN) 500 MCG tablet Take 1 tablet (500 mcg total) by mouth daily. 10/16/21  Yes TatOnalee Hua, MD    Inpatient Medications:  Current Facility-Administered Medications:    acetaminophen (TYLENOL) tablet 650 mg, 650 mg, Oral, Q6H PRN **OR** acetaminophen (TYLENOL) suppository 650 mg, 650 mg, Rectal, Q6H PRN, Emokpae, Ejiroghene E, MD   cefTRIAXone (ROCEPHIN) 2 g in sodium chloride 0.9 % 100 mL IVPB, 2 g, Intravenous, Q24H, Earnie Larsson, RPH, Stopped at 10/15/23 2034   Ipratropium-Albuterol (COMBIVENT) respimat 1 puff, 1 puff, Inhalation, Q6H PRN, Emokpae, Ejiroghene E, MD   losartan (COZAAR) tablet 12.5 mg, 12.5 mg, Oral, Daily, Emokpae, Ejiroghene E, MD, 12.5 mg at 10/16/23 1044   metoprolol succinate  (TOPROL-XL) 24 hr tablet 50 mg, 50 mg, Oral, Daily, Emokpae, Ejiroghene E, MD, 50 mg at 10/16/23 1044   metroNIDAZOLE (FLAGYL) IVPB 500 mg, 500 mg, Intravenous, Q12H, Emokpae, Ejiroghene E, MD, Stopped at 10/16/23 0631   mirtazapine (REMERON) tablet 7.5 mg, 7.5 mg, Oral, QHS, Emokpae, Ejiroghene E, MD, 7.5 mg at 10/15/23 2122   montelukast (SINGULAIR) tablet 10 mg, 10 mg, Oral, QHS, Emokpae, Ejiroghene E, MD, 10 mg at 10/15/23 2122   morphine (PF) 2 MG/ML injection 2 mg, 2 mg, Intravenous, Q4H PRN, Emokpae, Ejiroghene E, MD, 2 mg at 10/16/23 0518   ondansetron (ZOFRAN) tablet 4 mg, 4 mg, Oral, Q6H PRN **OR** ondansetron (ZOFRAN) injection 4 mg, 4 mg, Intravenous, Q6H PRN, Emokpae, Ejiroghene E, MD   pantoprazole (PROTONIX) injection 40 mg, 40 mg, Intravenous, Q12H, Shah, Pratik D, DO, 40 mg at 10/16/23 1044   polyethylene glycol (MIRALAX / GLYCOLAX) packet 17 g, 17 g, Oral, Daily PRN, Emokpae, Ejiroghene E, MD, 17 g at 10/15/23 2002   vancomycin (VANCOCIN) IVPB 1000 mg/200 mL premix, 1,000 mg, Intravenous, Q24H, Orson Aloe, [facility], Stopped at 10/15/23 1936 Allergies: Aspirin, Bee venom, Contrast media [iodinated contrast media], Lisinopril, Motrin [ibuprofen], Penicillins, and Dilaudid [hydromorphone hcl]  Complete Review of Systems: GENERAL: negative for malaise, night sweats HEENT: No changes in hearing or vision, no nose bleeds or other nasal problems. NECK: Negative for lumps, goiter, pain and significant neck swelling RESPIRATORY: Negative for cough, wheezing CARDIOVASCULAR: Negative for chest pain, leg swelling, palpitations, orthopnea GI: SEE HPI MUSCULOSKELETAL: Negative for joint pain or swelling, back pain, and muscle pain. SKIN: Negative for lesions, rash PSYCH: Negative for sleep disturbance, mood disorder and recent psychosocial stressors. HEMATOLOGY Negative for prolonged bleeding, bruising easily, and swollen nodes. ENDOCRINE: Negative for cold or heat intolerance,  polyuria, polydipsia and goiter. NEURO: negative for tremor, gait imbalance, syncope and seizures. The remainder of the review of systems is noncontributory.  Physical Exam: BP (!) 169/63 (BP Location: Left Arm)   Pulse 99   Temp 98.2 F (36.8 C) (Oral)   Resp 20   Ht 5' (1.524 m)   Wt 60.9 kg   SpO2 100%   BMI 26.22 kg/m  GENERAL: The patient is AO x3, in no acute distress. HEENT: Head is normocephalic and atraumatic. EOMI are intact. Mouth is well hydrated and without lesions. NECK: Supple. No masses LUNGS: Clear to auscultation. No presence of rhonchi/wheezing/rales. Adequate chest expansion HEART: RRR, normal s1 and s2. ABDOMEN: Soft, nontender, no guarding, no peritoneal signs, and nondistended. BS +. No masses. Has presence of a drain with catheter bag in the right  flank area. RECTAL EXAM: no external lesions, normal tone, no masses but mucosa on DRE was boggy, brown stool without blood. EXTREMITIES: Without any cyanosis, clubbing, rash, lesions or edema. NEUROLOGIC: AOx3, no focal motor deficit. SKIN: no jaundice, no rashes  Laboratory Data CBC:     Component Value Date/Time   WBC 12.4 (H) 10/16/2023 0450   RBC 2.65 (L) 10/16/2023 0450   HGB 8.1 (L) 10/16/2023 0450   HCT 28.3 (L) 10/16/2023 0450   PLT 439 (H) 10/16/2023 0450   MCV 106.8 (H) 10/16/2023 0450   MCH 30.6 10/16/2023 0450   MCHC 28.6 (L) 10/16/2023 0450   RDW 13.8 10/16/2023 0450   LYMPHSABS 2.5 10/05/2023 1300   MONOABS 0.8 10/05/2023 1300   EOSABS 0.2 10/05/2023 1300   BASOSABS 0.1 10/05/2023 1300   COAG:  Lab Results  Component Value Date   INR 1.0 10/10/2021   INR 1.09 09/07/2011    BMP:     Latest Ref Rng & Units 10/16/2023    4:50 AM 10/15/2023    4:01 AM 10/14/2023    2:16 AM  BMP  Glucose 70 - 99 mg/dL 161  096  045   BUN 8 - 23 mg/dL 5  9  15    Creatinine 0.44 - 1.00 mg/dL 4.09  8.11  9.14   Sodium 135 - 145 mmol/L 143  142  142   Potassium 3.5 - 5.1 mmol/L 3.9  3.4  3.5    Chloride 98 - 111 mmol/L 111  108  103   CO2 22 - 32 mmol/L 25  21  25    Calcium 8.9 - 10.3 mg/dL 8.1  7.5  7.4     HEPATIC:     Latest Ref Rng & Units 10/05/2023    1:00 PM 06/14/2022    6:00 AM 06/13/2022    3:45 AM  Hepatic Function  Total Protein 6.5 - 8.1 g/dL 7.5  4.8  4.8   Albumin 3.5 - 5.0 g/dL 2.6  2.2  2.3   AST 15 - 41 U/L 14  31  32   ALT 0 - 44 U/L 8  43  52   Alk Phosphatase 38 - 126 U/L 185  51  54   Total Bilirubin 0.0 - 1.2 mg/dL 0.8  1.2  1.3     CARDIAC:  Lab Results  Component Value Date   CKTOTAL 78 06/11/2022   CKMB 4.3 (H) 09/07/2011   TROPONINI <0.30 09/07/2011     Imaging: I personally reviewed and interpreted the available imaging.  Assessment & Plan: AMISHA POSPISIL is a 73 y.o. female with history of anxiety, CVA, GERD, hyperlipidemia, hypertension, history of myocardial infarction, COPD on oxygen 3 L/min, who was admitted to the hospital after presenting worsening right flank pain.  Patient was found to have sepsis secondary to iliopsoas abscess and underwent drain placement by interventional radiology on 10/15/2023.  Gastroenterology was consulted for evaluation of rectal bleeding.  The patient presented new onset of rectal bleeding in the setting of constipation yesterday.  This was only limited to 2 episodes of rectal bleeding.  She has not presented significant drop in her hemoglobin and her DRE today was completely normal, except for the presence of boggy mucosa raising concern for internal hemorrhoids.  Patient is presenting with symptoms of lower gastrointestinal bleeding.  She has not had a colonoscopy in 9 years and her last colonoscopy had a suboptimal preparation.  Ideally, we will proceed with a colonoscopy as outpatient to  evaluate her symptoms further, especially as she has a drain in place which may limit her ability to move to a commode while undergoing a bowel preparation.  I discussed this with the patient who is in agreement to  monitor her symptoms for now and undergo an eventual colonoscopy as outpatient.  For now, we will avoid issues with constipation by keeping her on a MiraLAX regimen.  Will need to closely monitor her blood counts for now.  -Advance diet as tolerated -Check H&H every day -MiraLAX twice daily -If possible, encourage ambulation -Possible outpatient colonoscopy in 4 to 6 weeks  Katrinka Blazing, MD Gastroenterology and Hepatology [facility] Gastroenterology

## 2023-10-16 NOTE — Progress Notes (Signed)
PROGRESS NOTE    [name]  BJY:[id number] DOB: 22-Sep-1950 DOA: 10/13/2023 PCP: Benetta Spar, MD   Brief Narrative:    [name] is a 73 y.o. 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 Patient presented to the ED with complaints of right flank pain and swelling that started November.  She reports onset of drainage from her right flank yesterday.  Patient was admitted for sepsis, POA secondary to iliopsoas abscess and patient underwent drain placement with IR on 1/24.  General surgery following with plans for repeat CT scan 1/27.  There is now concern for rectal bleeding and GI consulted as well.  Assessment & Plan:   Principal Problem:   Iliopsoas abscess (HCC) Active Problems:   Sepsis (HCC)   Hypokalemia   Chronic hypoxic respiratory failure (HCC)   Chronic systolic CHF (congestive heart failure) (HCC)   Essential hypertension   COPD GOLD ?  Assessment and Plan:   Sepsis, POA, secondary to iliopsoas abscess (HCC) Presenting with likely early sepsis, WBC-11.4, with tachycardia heart rate 95-104.  Tmax 98.7.  MRI abdomen with and without contrast 10/10/2023-  Multiple complex fluid collections in the right iliopsoas musculature and right pelvic sidewall, with fistulous tracts extending into the right flank musculature and subcutaneous fat of the right flank, as above, concerning for abscesses. -Continue IV antibiotics with plan for repeat CT 1/27 -Status post drain placement with IR 1/24 - HgbA1c 6.0%  Chronic systolic CHF (congestive heart failure) (HCC) Stable and compensated.  Last echo 03/2023 EF of 50%, showing improved EF from 35 to 40%. -In setting of early sepsis, hold torsemide 40 mg twice daily for now, resume when able -Clear liquid diet for now -Hold Plavix -Resume metoprolol, losartan  Rectal bleed with possible acute blood loss anemia -Continue to  monitor hemoglobin levels and transfuse for hemoglobin less than 7 -PPI twice daily -Clear liquid diet -Appreciate GI evaluation  COPD GOLD ? 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  Stable.  No wheezing.    DVT prophylaxis: SCDs Code Status: Full Family Communication: None at bedside Disposition Plan: Plan for drain placement per IR 1/24 Status is: Inpatient Remains inpatient appropriate because: Need for IV medications.   Consultants:  IR General surgery GI  Procedures:  None  Antimicrobials:  Anti-infectives (From admission, onward)    Start     Dose/Rate Route Frequency Ordered Stop   10/15/23 1800  vancomycin (VANCOCIN) IVPB 1000 mg/200 mL premix        1,000 mg 200 mL/hr over 60 Minutes Intravenous Every 24 hours 10/15/23 1200     10/14/23 2000  cefTRIAXone (ROCEPHIN) 2 g in sodium chloride 0.9 % 100 mL IVPB        2 g 200 mL/hr over 30 Minutes Intravenous Every 24 hours 10/14/23 1233     10/14/23 1800  vancomycin (VANCOREADY) IVPB 750 mg/150 mL  Status:  Discontinued        750 mg 150 mL/hr over 60 Minutes Intravenous Every 24 hours 10/13/23 1618 10/15/23 1200   10/14/23 0400  aztreonam (AZACTAM) 2 g in sodium chloride 0.9 % 100 mL IVPB  Status:  Discontinued        2 g 200 mL/hr over 30 Minutes Intravenous Every 8 hours 10/13/23 1653 10/14/23 1233   10/13/23 1800  metroNIDAZOLE (FLAGYL) IVPB 500 mg        500 mg 100 mL/hr over 60 Minutes  Intravenous Every 12 hours 10/13/23 1622     10/13/23 1630  vancomycin (VANCOREADY) IVPB 1500 mg/300 mL        1,500 mg 150 mL/hr over 120 Minutes Intravenous  Once 10/13/23 1617 10/13/23 2011   10/13/23 1630  aztreonam (AZACTAM) 2 g in sodium chloride 0.9 % 100 mL IVPB        2 g 200 mL/hr over 30 Minutes Intravenous  Once 10/13/23 1620 10/13/23 1740       Subjective: Patient seen and evaluated today and was noted to have rectal bleeding yesterday evening as well as 2 other bloody bowel movements overnight.  She  denies abdominal pain, nausea, or vomiting.  Drain has been placed yesterday.  Objective: Vitals:   10/15/23 0346 10/15/23 2000 10/15/23 2255 10/16/23 0515  BP: (!) 156/84 (!) 166/83  (!) 169/63  Pulse:  95  99  Resp: 20 (!) 22  20  Temp: 98.7 F (37.1 C) 98.7 F (37.1 C)  98.2 F (36.8 C)  TempSrc:  Oral  Oral  SpO2: 100% 100% 100% 100%  Weight:      Height:        Intake/Output Summary (Last 24 hours) at 10/16/2023 1014 Last data filed at 10/16/2023 0644 Gross per 24 hour  Intake 900 ml  Output 35 ml  Net 865 ml   Filed Weights   10/13/23 1522 10/13/23 2354  Weight: 63.5 kg 60.9 kg    Examination:  General exam: Appears calm and comfortable  Respiratory system: Clear to auscultation. Respiratory effort normal. Cardiovascular system: S1 & S2 heard, RRR.  Gastrointestinal system: Abdomen is soft, right flank with bag present with serosanguineous drainage. Central nervous system: Alert and awake Extremities: No edema Skin: No significant lesions noted Psychiatry: Flat affect.    Data Reviewed: I have personally reviewed following labs and imaging studies  CBC: Recent Labs  Lab 10/13/23 1608 10/14/23 0216 10/15/23 0401 10/15/23 1635 10/16/23 0450  WBC 11.4* 15.8* 12.3*  --  12.4*  HGB 9.8* 9.2* 9.3* 9.2* 8.1*  HCT 32.4* 30.5* 32.0* 31.4* 28.3*  MCV 105.9* 105.9* 107.7*  --  106.8*  PLT 504* 487* 521*  --  439*   Basic Metabolic Panel: Recent Labs  Lab 10/13/23 1608 10/14/23 0216 10/15/23 0401 10/16/23 0450  NA 141 142 142 143  K 3.4* 3.5 3.4* 3.9  CL 99 103 108 111  CO2 28 25 21* 25  GLUCOSE 176* 111* 165* 154*  BUN 15 15 9  5*  CREATININE 0.92 0.83 0.74 0.65  CALCIUM 7.8* 7.4* 7.5* 8.1*  MG 1.0*  --  1.1* 1.8   GFR: Estimated Creatinine Clearance: 50.4 mL/min (by C-G formula based on SCr of 0.65 mg/dL). Liver Function Tests: No results for input(s): "AST", "ALT", "ALKPHOS", "BILITOT", "PROT", "ALBUMIN" in the last 168 hours. No results for  input(s): "LIPASE", "AMYLASE" in the last 168 hours. No results for input(s): "AMMONIA" in the last 168 hours. Coagulation Profile: No results for input(s): "INR", "PROTIME" in the last 168 hours. Cardiac Enzymes: No results for input(s): "CKTOTAL", "CKMB", "CKMBINDEX", "TROPONINI" in the last 168 hours. BNP (last 3 results) No results for input(s): "PROBNP" in the last 8760 hours. HbA1C: Recent Labs    10/13/23 1608  HGBA1C 6.0*   CBG: No results for input(s): "GLUCAP" in the last 168 hours. Lipid Profile: No results for input(s): "CHOL", "HDL", "LDLCALC", "TRIG", "CHOLHDL", "LDLDIRECT" in the last 72 hours. Thyroid Function Tests: No results for input(s): "TSH", "T4TOTAL", "FREET4", "T3FREE", "THYROIDAB"  in the last 72 hours. Anemia Panel: No results for input(s): "VITAMINB12", "FOLATE", "FERRITIN", "TIBC", "IRON", "RETICCTPCT" in the last 72 hours. Sepsis Labs: Recent Labs  Lab 10/13/23 2135 10/14/23 0216  LATICACIDVEN 2.1* 2.7*    Recent Results (from the past 240 hours)  Blood culture (routine x 2)     Status: None (Preliminary result)   Collection Time: 10/13/23  4:54 PM   Specimen: BLOOD LEFT WRIST  Result Value Ref Range Status   Specimen Description BLOOD LEFT WRIST  Final   Special Requests   Final    BOTTLES DRAWN AEROBIC AND ANAEROBIC Blood Culture adequate volume   Culture   Final    NO GROWTH 3 DAYS Performed at [facility], [address] [place], [place] [address]    Report Status PENDING  Incomplete  Blood culture (routine x 2)     Status: None (Preliminary result)   Collection Time: 10/13/23  4:54 PM   Specimen: BLOOD LEFT WRIST  Result Value Ref Range Status   Specimen Description BLOOD LEFT WRIST  Final   Special Requests   Final    BOTTLES DRAWN AEROBIC AND ANAEROBIC Blood Culture adequate volume   Culture   Final    NO GROWTH 3 DAYS Performed at [facility], [address] [place], [place] [address]    Report Status PENDING  Incomplete   Aerobic/Anaerobic Culture w Gram Stain (surgical/deep wound)     Status: None (Preliminary result)   Collection Time: 10/15/23  2:13 PM   Specimen: Abscess  Result Value Ref Range Status   Specimen Description ABSCESS  Final   Special Requests POST GUIDED DRAIN  Final   Gram Stain   Final    MODERATE WBC PRESENT, PREDOMINANTLY PMN NO ORGANISMS SEEN    Culture   Final    NO GROWTH < 24 HOURS Performed at [facility], [address], [place], [place] [address]    Report Status PENDING  Incomplete         Radiology Studies: CT GUIDED PERITONEAL/RETROPERITONEAL FLUID DRAIN BY [facility] CATH Result Date: 10/15/2023 INDICATION: Right abdominal/pelvic abscess. Right flank abscess. Please perform image guided drainage catheter placement for infection source control purposes. EXAM: CT PERC DRAIN PERITONEAL ABCESS COMPARISON:  CT abdomen pelvis-10/04/2022; 05/02/2022; 10/09/2021 Abdominal MRI-10/10/2023 MEDICATIONS: The patient is currently admitted to the hospital and receiving intravenous antibiotics. The antibiotics were administered within an appropriate time frame prior to the initiation of the procedure. ANESTHESIA/SEDATION: Moderate (conscious) sedation was employed during this procedure. A total of Versed 1 mg and Fentanyl 25 mcg was administered intravenously. Moderate Sedation Time: 12 minutes. The patient's level of consciousness and vital signs were monitored continuously by radiology nursing throughout the procedure under my direct supervision. CONTRAST:  None COMPLICATIONS: None immediate. PROCEDURE: RADIATION DOSE REDUCTION: This exam was performed according to the departmental dose-optimization program which includes automated exposure control, adjustment of the mA and/or kV according to patient size and/or use of iterative reconstruction technique. Informed written consent was obtained from the patient after a discussion of the risks, benefits and alternatives to treatment. The  patient was placed supine, slightly LPO on the CT gantry and a pre procedural CT was performed re-demonstrating the known abscess/fluid collection within the right lower abdomen with dominant component measuring at least 4.7 x 3.0 cm (image 48, series 2). 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 The procedure was planned.  A timeout was performed prior to the initiation of the procedure. The skin overlying the inferolateral aspect of the right lower abdomen/pelvis was prepped and draped in the usual sterile fashion. The overlying soft tissues were anesthetized with 1% lidocaine with epinephrine. Appropriate trajectory was planned with the use of a 22 gauge spinal needle. An 18 gauge trocar needle was advanced into the abscess/fluid collection and a short Amplatz super stiff wire was coiled within the collection. Appropriate positioning was confirmed with a limited CT scan. The tract was serially dilated allowing placement of a 10 [place] all-purpose drainage catheter. Appropriate positioning was confirmed with a limited postprocedural CT scan. Approximately 30 ml of purulent fluid was aspirated. The tube was connected to a drainage bag and sutured in place. A dressing was applied. The patient tolerated the procedure well without immediate post procedural complication. IMPRESSION: Successful CT guided placement of a 10 French all purpose drain catheter into the right lower abdominal/pelvic abscess with aspiration of 30 mL of purulent fluid. Samples were sent to the laboratory as requested by the ordering clinical team. Review of preprocedural noncontrast CT scan of the abdomen pelvis performed 10/05/2023 demonstrates a punctate (4 mm) opacity within the right lower abdomen/pelvis (image 50, series 2) which is nonspecific though favored to represent either a dropped gallstone versus an appendicolith.  Note, patient denies history of previous appendectomy however the appendix is not visualized and a surgical clip is noted adjacent to the cecum. Concern that this complex right abdominal/pelvic and body wall abscess is attributable to either a dropped gallstone or appendicolith was discussed with referring surgeon, Dr. Lovell Sheehan, at the time of procedure completion. Electronically Signed   By: Simonne Come M.D.   On: 10/15/2023 14:42        Scheduled Meds:  losartan  12.5 mg Oral Daily   metoprolol succinate  50 mg Oral Daily   mirtazapine  7.5 mg Oral QHS   montelukast  10 mg Oral QHS   pantoprazole (PROTONIX) IV  40 mg Intravenous Q12H   Continuous Infusions:  cefTRIAXone (ROCEPHIN)  IV Stopped (10/15/23 2034)   metronidazole Stopped (10/16/23 0631)   vancomycin Stopped (10/15/23 1936)     LOS: 3 days    Time spent: 55 minutes    Sheryle Vice D Sherryll Burger, DO [facility]  If 7PM-7AM, please contact night-coverage [email or web address] 10/16/2023, 10:14 AM

## 2023-10-16 NOTE — Consult Note (Signed)
Reason for Consult: Right psoas muscle and flank abscess Referring Physician: Dr. August Luz [name] is an 73 y.o. female.  HPI: Patient is a 73 year old black female with multiple medical problems including COPD with chronic respiratory failure on home O2, hypertension, and CVA, who was found earlier this month on CT scan to have a fluid collection along the right psoas muscle.  She underwent MRI and was found to have a right iliopsoas abscess with fistulous tract to the right flank.  This right flank abscess started to resolve.  She underwent interventional radiology catheter placement and drainage on 10/15/2023.  The right flank abscess had drained and resolved and a small amount of purulent fluid was found.  Incidentally, a small dropped gallstone or appendicolith 4 mm in size was seen.  Patient states she is not aware that she had an appendectomy.  The appendix was not seen on CT scan.  She has had a hysterectomy in the remote past.  She is status post laparoscopic cholecystectomy with intraoperative cholangiograms in January 2023 by myself.  Patient denies any significant pain.  Past Medical History:  Diagnosis Date   Anxiety    Blind    right   Colitis 09/07/2011   CVA (cerebral infarction)    GERD (gastroesophageal reflux disease)    Hyperlipidemia    Hypertension    Myocardial infarct (HCC)    1997, 2004   Severe major depression with psychotic features (HCC)    2004   Stroke [facility]) 2002    Past Surgical History:  Procedure Laterality Date   ABDOMINAL HYSTERECTOMY     BILATERAL SALPINGOOPHORECTOMY     BIOPSY  05/08/2022   Procedure: BIOPSY;  Surgeon: Dolores Frame, MD;  Location: [facility];  Service: Gastroenterology;;   CHOLECYSTECTOMY N/A 10/15/2021   Procedure: LAPAROSCOPIC CHOLECYSTECTOMY WITH INTRAOPERATIVE CHOLANGIOGRAM;  Surgeon: Franky Macho, MD;  Location: [facility];  Service: General;  Laterality: N/A;   COLONOSCOPY N/A 07/17/2014   RMR: Extremely  redundant colon. Colonic diverticulosis. Inadequate prepartation precluded examination of all the rectal and colonic.  I suspect slow colonic transit in the setting of a markely redundant colon.   CORONARY ANGIOPLASTY WITH STENT PLACEMENT  2440,1027   cyst removed from left wrist     ERCP N/A 10/10/2021   Procedure: ENDOSCOPIC RETROGRADE CHOLANGIOPANCREATOGRAPHY (ERCP);  Surgeon: Malissa Hippo, MD;  Location: [facility];  Service: Gastroenterology;  Laterality: N/A;   ERCP N/A 10/13/2021   Procedure: ENDOSCOPIC RETROGRADE CHOLANGIOPANCREATOGRAPHY (ERCP);  Surgeon: Malissa Hippo, MD;  Location: [facility];  Service: Gastroenterology;  Laterality: N/A;   ESOPHAGEAL DILATION  05/08/2022   Procedure: ESOPHAGEAL DILATION;  Surgeon: Marguerita Merles, Reuel Boom, MD;  Location: [facility];  Service: Gastroenterology;;  savory dilation'   ESOPHAGOGASTRODUODENOSCOPY  09/05/2004   Dr Jimmye Norman gastritis, candida esophagitis   ESOPHAGOGASTRODUODENOSCOPY  09/07/2011   [id number], esophageal in the distal esophagus/Mild gastritis   ESOPHAGOGASTRODUODENOSCOPY (EGD) WITH PROPOFOL N/A 05/08/2022   Procedure: ESOPHAGOGASTRODUODENOSCOPY (EGD) WITH PROPOFOL;  Surgeon: Dolores Frame, MD;  [facility];  Service: Gastroenterology;  Laterality: N/A;  with possible esophageal dilation   Lumps removed from each breast     SPHINCTEROTOMY N/A 10/10/2021   Procedure: SPHINCTEROTOMY INCOMPLETE, STONE NOT REMOVED;  Surgeon: Malissa Hippo, MD;  Location: [facility];  Service: Gastroenterology;  Laterality: N/A;    Family History  Problem Relation Age of Onset   Multiple sclerosis Father    Alzheimer's disease Mother    Hypertension Mother  Diabetes Mother    Colon cancer Neg Hx     Social History:  reports that she quit smoking about 6 years ago. Her smoking use included cigarettes. She started smoking about 46 years ago. She has a 10 pack-year smoking history. She has never used smokeless  tobacco. She reports current drug use. Drug: Marijuana. She reports that she does not drink alcohol.  Allergies:  Allergies  Allergen Reactions   Aspirin Swelling and Other (See Comments)    Only in large doses will cause a reaction   Bee Venom Swelling    Severe swelling   Contrast Media [Iodinated Contrast Media] Swelling   Lisinopril Swelling   Motrin [Ibuprofen] Swelling   Penicillins Anaphylaxis   Dilaudid [Hydromorphone Hcl] Itching    Medications: I have reviewed the patient's current medications. Prior to Admission:  Medications Prior to Admission  Medication Sig Dispense Refill Last Dose/Taking   acetaminophen (TYLENOL) 650 MG CR tablet Take 1,300 mg by mouth every 8 (eight) hours as needed for pain.   10/12/2023 Bedtime   clopidogrel (PLAVIX) 75 MG tablet Take 1 tablet (75 mg total) by mouth daily.   10/13/2023 at  8:00 AM   folic acid (FOLVITE) 1 MG tablet Take 1 tablet (1 mg total) by mouth daily.   10/13/2023 Morning   Ipratropium-Albuterol (COMBIVENT) 20-100 MCG/ACT AERS respimat Inhale 1 puff into the lungs every 6 (six) hours as needed for wheezing or shortness of breath. 4 g 2 Past Week   losartan (COZAAR) 25 MG tablet Take 12.5 mg by mouth daily.   10/13/2023 Morning   metoprolol succinate (TOPROL XL) 50 MG 24 hr tablet Take 1 tablet (50 mg total) by mouth daily. Take with or immediately following a meal. 90 tablet 1 10/13/2023 Morning   mirtazapine (REMERON) 7.5 MG tablet Take 7.5 mg by mouth at bedtime.   10/12/2023 Bedtime   montelukast (SINGULAIR) 10 MG tablet Take 10 mg by mouth at bedtime.   10/12/2023 Bedtime   torsemide (DEMADEX) 20 MG tablet Take 40 mg by mouth 2 (two) times daily.   10/13/2023 Morning   vitamin B-12 (CYANOCOBALAMIN) 500 MCG tablet Take 1 tablet (500 mcg total) by mouth daily.   10/13/2023 Morning    Results for orders placed or performed during the hospital encounter of 10/13/23 (from the past 48 hours)  CBC     Status: Abnormal   Collection Time:  10/15/23  4:01 AM  Result Value Ref Range   WBC 12.3 (H) 4.0 - 10.5 K/uL   RBC 2.97 (L) 3.87 - 5.11 MIL/uL   Hemoglobin 9.3 (L) 12.0 - 15.0 g/dL   HCT 16.1 (L) 09.6 - 04.5 %   MCV 107.7 (H) 80.0 - 100.0 fL   MCH 31.3 26.0 - 34.0 pg   MCHC 29.1 (L) 30.0 - 36.0 g/dL   RDW 40.9 81.1 - 91.4 %   Platelets 521 (H) 150 - 400 K/uL   nRBC 0.0 0.0 - 0.2 %    Comment: Performed at [facility], [address] [place], [place] [address]  Basic metabolic panel     Status: Abnormal   Collection Time: 10/15/23  4:01 AM  Result Value Ref Range   Sodium 142 135 - 145 mmol/L   Potassium 3.4 (L) 3.5 - 5.1 mmol/L   Chloride 108 98 - 111 mmol/L   CO2 21 (L) 22 - 32 mmol/L   Glucose, Bld 165 (H) 70 - 99 mg/dL    Comment: Glucose reference range applies  only to samples taken after fasting for at least 8 hours.   BUN 9 8 - 23 mg/dL   Creatinine, Ser 1.61 0.44 - 1.00 mg/dL   Calcium 7.5 (L) 8.9 - 10.3 mg/dL   GFR, Estimated >09 >60 mL/min    Comment: (NOTE) Calculated using the CKD-EPI Creatinine Equation (2021)    Anion gap 13 5 - 15    Comment: Performed at [facility], [address], [place], [place] [address]  Magnesium     Status: Abnormal   Collection Time: 10/15/23  4:01 AM  Result Value Ref Range   Magnesium 1.1 (L) 1.7 - 2.4 mg/dL    Comment: Performed at [facility], [address] [place], [place] [address]  Hemoglobin and hematocrit, blood     Status: Abnormal   Collection Time: 10/15/23  4:35 PM  Result Value Ref Range   Hemoglobin 9.2 (L) 12.0 - 15.0 g/dL   HCT 47.8 (L) 29.5 - 62.1 %    Comment: Performed at [facility], [address], [place], [place] [address]  Basic metabolic panel     Status: Abnormal   Collection Time: 10/16/23  4:50 AM  Result Value Ref Range   Sodium 143 135 - 145 mmol/L   Potassium 3.9 3.5 - 5.1 mmol/L   Chloride 111 98 - 111 mmol/L   CO2 25 22 - 32 mmol/L   Glucose, Bld 154 (H) 70 - 99 mg/dL    Comment: Glucose reference range applies only to  samples taken after fasting for at least 8 hours.   BUN 5 (L) 8 - 23 mg/dL   Creatinine, Ser 7.84 0.44 - 1.00 mg/dL   Calcium 8.1 (L) 8.9 - 10.3 mg/dL   GFR, Estimated >69 >62 mL/min    Comment: (NOTE) Calculated using the CKD-EPI Creatinine Equation (2021)    Anion gap 7 5 - 15    Comment: Performed at [facility], [address] [place], [place] [address]  Magnesium     Status: None   Collection Time: 10/16/23  4:50 AM  Result Value Ref Range   Magnesium 1.8 1.7 - 2.4 mg/dL    Comment: Performed at [facility], [address] [place], [place] [address]  CBC     Status: Abnormal   Collection Time: 10/16/23  4:50 AM  Result Value Ref Range   WBC 12.4 (H) 4.0 - 10.5 K/uL   RBC 2.65 (L) 3.87 - 5.11 MIL/uL   Hemoglobin 8.1 (L) 12.0 - 15.0 g/dL   HCT 01.0 (L) 27.2 - 53.6 %   MCV 106.8 (H) 80.0 - 100.0 fL   MCH 30.6 26.0 - 34.0 pg   MCHC 28.6 (L) 30.0 - 36.0 g/dL   RDW 64.4 03.4 - 74.2 %   Platelets 439 (H) 150 - 400 K/uL   nRBC 0.0 0.0 - 0.2 %    Comment: Performed at [facility], [address] [place], [place] [address]    CT GUIDED PERITONEAL/RETROPERITONEAL FLUID DRAIN BY [facility] CATH Result Date: 10/15/2023 INDICATION: Right abdominal/pelvic abscess. Right flank abscess. Please perform image guided drainage catheter placement for infection source control purposes. EXAM: CT PER[facility] DRAIN PERITONEAL ABCESS COMPARISON:  CT abdomen pelvis-10/04/2022; 05/02/2022; 10/09/2021 Abdominal MRI-10/10/2023 MEDICATIONS: The patient is currently admitted to the hospital and receiving intravenous antibiotics. The antibiotics were administered within an appropriate time frame prior to the initiation of the procedure. ANESTHESIA/SEDATION: Moderate (conscious) sedation was employed during this procedure. A total of Versed 1 mg and Fentanyl 25 mcg  was administered intravenously. Moderate Sedation Time: 12 minutes. The patient's level of consciousness and vital signs were monitored continuously by radiology  nursing throughout the procedure under my direct supervision. CONTRAST:  None COMPLICATIONS: None immediate. PROCEDURE: RADIATION DOSE REDUCTION: This exam was performed according to the departmental dose-optimization program which includes automated exposure control, adjustment of the mA and/or kV according to patient size and/or use of iterative reconstruction technique. Informed written consent was obtained from the patient after a discussion of the risks, benefits and alternatives to treatment. The patient was placed supine, slightly LPO on the CT gantry and a pre procedural CT was performed re-demonstrating the known abscess/fluid collection within the right lower abdomen with dominant component measuring at least 4.7 x 3.0 cm (image 48, series 2). 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 The procedure was planned. A timeout was performed prior to the initiation of the procedure. The skin overlying the inferolateral aspect of the right lower abdomen/pelvis was prepped and draped in the usual sterile fashion. The overlying soft tissues were anesthetized with 1% lidocaine with epinephrine. Appropriate trajectory was planned with the use of a 22 gauge spinal needle. An 18 gauge trocar needle was advanced into the abscess/fluid collection and a short Amplatz super stiff wire was coiled within the collection. Appropriate positioning was confirmed with a limited CT scan. The tract was serially dilated allowing placement of a 10 [place] all-purpose drainage catheter. Appropriate positioning was confirmed with a limited postprocedural CT scan. Approximately 30 ml of purulent fluid was aspirated. The tube was connected to a drainage bag and sutured in place. A dressing was applied. The patient tolerated the procedure well without immediate post procedural complication. IMPRESSION: Successful CT guided  placement of a 10 French all purpose drain catheter into the right lower abdominal/pelvic abscess with aspiration of 30 mL of purulent fluid. Samples were sent to the laboratory as requested by the ordering clinical team. Review of preprocedural noncontrast CT scan of the abdomen pelvis performed 10/05/2023 demonstrates a punctate (4 mm) opacity within the right lower abdomen/pelvis (image 50, series 2) which is nonspecific though favored to represent either a dropped gallstone versus an appendicolith. Note, patient denies history of previous appendectomy however the appendix is not visualized and a surgical clip is noted adjacent to the cecum. Concern that this complex right abdominal/pelvic and body wall abscess is attributable to either a dropped gallstone or appendicolith was discussed with referring surgeon, Dr. Lovell Sheehan, at the time of procedure completion. Electronically Signed   By: Simonne Come M.D.   On: 10/15/2023 14:42    ROS:  Pertinent items are noted in HPI.  Blood pressure (!) 169/63, pulse 99, temperature 98.2 F (36.8 C), temperature source Oral, resp. rate 20, height 5' (1.524 m), weight 60.9 kg, SpO2 100%. Physical Exam: Pleasant black female no acute distress Head is normocephalic, atraumatic Lungs clear to auscultation, no wheezing noted. Heart examination reveals a regular rate rhythm without S3, S4, murmurs Abdomen soft, nontender, nondistended. Right flank region with induration at right flank with minimal drainage.  Serosanguineous drainage is noted in the catheter bag.  It is minimal.  CT scan results reviewed  Assessment/Plan: Impression: Right iliopsoas abscess, resolving.  The nidus of this infection is unknown.  Patient may have had perforated appendicitis as the cause of this versus a dropped gallstone which would be less likely. Plan: Continue current management.  Repeat CT scan  on 10/18/2023.  Will follow with you.  At this point, no need for surgical intervention as  patient is at increased risk.  Franky Macho 10/16/2023, 7:59 AM

## 2023-10-16 NOTE — Progress Notes (Signed)
   10/16/23 2120  Assess: MEWS Score  ECG Heart Rate 100  Assess: MEWS Score  MEWS Temp 0  MEWS Systolic 0  MEWS Pulse 0  MEWS RR 0  MEWS LOC 0  MEWS Score 0  MEWS Score Color Green  Assess: SIRS CRITERIA  SIRS Temperature  0  SIRS Respirations  0  SIRS Pulse 1  SIRS WBC 0  SIRS Score Sum  1

## 2023-10-16 NOTE — Plan of Care (Signed)
Problem: Education: Goal: Knowledge of General Education information will improve Description Including pain rating scale, medication(s)/side effects and non-pharmacologic comfort measures Outcome: Progressing   Problem: Clinical Measurements: Goal: Ability to maintain clinical measurements within normal limits will improve Outcome: Progressing   Problem: Clinical Measurements: Goal: Will remain free from infection Outcome: Progressing

## 2023-10-17 ENCOUNTER — Inpatient Hospital Stay (HOSPITAL_COMMUNITY): Payer: Medicare Other

## 2023-10-17 ENCOUNTER — Telehealth: Payer: Self-pay | Admitting: Gastroenterology

## 2023-10-17 DIAGNOSIS — K6812 Psoas muscle abscess: Secondary | ICD-10-CM | POA: Diagnosis not present

## 2023-10-17 DIAGNOSIS — K625 Hemorrhage of anus and rectum: Secondary | ICD-10-CM | POA: Diagnosis not present

## 2023-10-17 LAB — CBC
HCT: 28.2 % — ABNORMAL LOW (ref 36.0–46.0)
Hemoglobin: 8.5 g/dL — ABNORMAL LOW (ref 12.0–15.0)
MCH: 32 pg (ref 26.0–34.0)
MCHC: 30.1 g/dL (ref 30.0–36.0)
MCV: 106 fL — ABNORMAL HIGH (ref 80.0–100.0)
Platelets: 454 10*3/uL — ABNORMAL HIGH (ref 150–400)
RBC: 2.66 MIL/uL — ABNORMAL LOW (ref 3.87–5.11)
RDW: 14 % (ref 11.5–15.5)
WBC: 11.9 10*3/uL — ABNORMAL HIGH (ref 4.0–10.5)
nRBC: 0 % (ref 0.0–0.2)

## 2023-10-17 LAB — BASIC METABOLIC PANEL
Anion gap: 12 (ref 5–15)
BUN: <5 mg/dL — ABNORMAL LOW (ref 8–23)
CO2: 28 mmol/L (ref 22–32)
Calcium: 8.2 mg/dL — ABNORMAL LOW (ref 8.9–10.3)
Chloride: 104 mmol/L (ref 98–111)
Creatinine, Ser: 0.81 mg/dL (ref 0.44–1.00)
GFR, Estimated: >60 mL/min (ref >60)
Glucose, Bld: 154 mg/dL — ABNORMAL HIGH (ref 70–99)
Potassium: 3.2 mmol/L — ABNORMAL LOW (ref 3.5–5.1)
Sodium: 144 mmol/L (ref 135–145)

## 2023-10-17 LAB — MAGNESIUM: Magnesium: 1.4 mg/dL — ABNORMAL LOW (ref 1.7–2.4)

## 2023-10-17 MED ORDER — PANTOPRAZOLE SODIUM 40 MG PO TBEC
40.0000 mg | DELAYED_RELEASE_TABLET | Freq: Two times a day (BID) | ORAL | Status: DC
  Administered 2023-10-17 – 2023-10-19 (×4): 40 mg via ORAL
  Filled 2023-10-17 (×4): qty 1

## 2023-10-17 MED ORDER — POTASSIUM CHLORIDE CRYS ER 20 MEQ PO TBCR
40.0000 meq | EXTENDED_RELEASE_TABLET | Freq: Once | ORAL | Status: AC
  Administered 2023-10-17: 40 meq via ORAL
  Filled 2023-10-17: qty 2

## 2023-10-17 MED ORDER — MAGNESIUM SULFATE 2 GM/50ML IV SOLN
2.0000 g | Freq: Once | INTRAVENOUS | Status: AC
  Administered 2023-10-17: 2 g via INTRAVENOUS
  Filled 2023-10-17: qty 50

## 2023-10-17 NOTE — Progress Notes (Signed)
Subjective: Patient has no complaints.  Objective: Vital signs in last 24 hours: Temp:  [97.9 F (36.6 C)-98.6 F (37 C)] 98.6 F (37 C) (01/26 0400) Pulse Rate:  [100-112] 105 (01/26 0400) Resp:  [17-20] 20 (01/26 0400) BP: (117-143)/(54-84) 125/63 (01/26 0400) SpO2:  [95 %-100 %] 100 % (01/26 0400) Last BM Date : 10/16/23  Intake/Output from previous day: 01/25 0701 - 01/26 0700 In: 480 [P.O.:480] Out: 10 [Drains:10] Intake/Output this shift: No intake/output data recorded.  General appearance: alert, cooperative, and no distress GI: soft, non-tender; bowel sounds normal; no masses,  no organomegaly and minimal serosanguineous drainage from catheter.  Minimal drainage from right flank.  Lab Results:  Recent Labs    10/16/23 0450 10/16/23 1004 10/17/23 0427  WBC 12.4*  --  11.9*  HGB 8.1* 8.4* 8.5*  HCT 28.3* 28.2* 28.2*  PLT 439*  --  454*   BMET Recent Labs    10/16/23 0450 10/17/23 0427  NA 143 144  K 3.9 3.2*  CL 111 104  CO2 25 28  GLUCOSE 154* 154*  BUN 5* <5*  CREATININE 0.65 0.81  CALCIUM 8.1* 8.2*   PT/INR No results for input(s): "LABPROT", "INR" in the last 72 hours.  Studies/Results: CT GUIDED PERITONEAL/RETROPERITONEAL FLUID DRAIN BY [facility] CATH Result Date: 10/15/2023 INDICATION: Right abdominal/pelvic abscess. Right flank abscess. Please perform image guided drainage catheter placement for infection source control purposes. EXAM: CT PERC DRAIN PERITONEAL ABCESS COMPARISON:  CT abdomen pelvis-10/04/2022; 05/02/2022; 10/09/2021 Abdominal MRI-10/10/2023 MEDICATIONS: The patient is currently admitted to the hospital and receiving intravenous antibiotics. The antibiotics were administered within an appropriate time frame prior to the initiation of the procedure. ANESTHESIA/SEDATION: Moderate (conscious) sedation was employed during this procedure. A total of Versed 1 mg and Fentanyl 25 mcg was administered intravenously. Moderate Sedation Time: 12  minutes. The patient's level of consciousness and vital signs were monitored continuously by radiology nursing throughout the procedure under my direct supervision. CONTRAST:  None COMPLICATIONS: None immediate. PROCEDURE: RADIATION DOSE REDUCTION: This exam was performed according to the departmental dose-optimization program which includes automated exposure control, adjustment of the mA and/or kV according to patient size and/or use of iterative reconstruction technique. Informed written consent was obtained from the patient after a discussion of the risks, benefits and alternatives to treatment. The patient was placed supine, slightly LPO on the CT gantry and a pre procedural CT was performed re-demonstrating the known abscess/fluid collection within the right lower abdomen with dominant component measuring at least 4.7 x 3.0 cm (image 48, series 2). 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 The procedure was planned. A timeout was performed prior to the initiation of the procedure. The skin overlying the inferolateral aspect of the right lower abdomen/pelvis was prepped and draped in the usual sterile fashion. The overlying soft tissues were anesthetized with 1% lidocaine with epinephrine. Appropriate trajectory was planned with the use of a 22 gauge spinal needle. An 18 gauge trocar needle was advanced into the abscess/fluid collection and a short Amplatz super stiff wire was coiled within the collection. Appropriate positioning was confirmed with a limited CT scan. The tract was serially dilated allowing placement of a 10 [place] all-purpose drainage catheter. Appropriate positioning was confirmed with a limited postprocedural CT scan. Approximately 30 ml of purulent fluid was aspirated. The tube was connected to a drainage bag and sutured in place. A dressing was applied. The  patient tolerated  the procedure well without immediate post procedural complication. IMPRESSION: Successful CT guided placement of a 10 French all purpose drain catheter into the right lower abdominal/pelvic abscess with aspiration of 30 mL of purulent fluid. Samples were sent to the laboratory as requested by the ordering clinical team. Review of preprocedural noncontrast CT scan of the abdomen pelvis performed 10/05/2023 demonstrates a punctate (4 mm) opacity within the right lower abdomen/pelvis (image 50, series 2) which is nonspecific though favored to represent either a dropped gallstone versus an appendicolith. Note, patient denies history of previous appendectomy however the appendix is not visualized and a surgical clip is noted adjacent to the cecum. Concern that this complex right abdominal/pelvic and body wall abscess is attributable to either a dropped gallstone or appendicolith was discussed with referring surgeon, Dr. Lovell Sheehan, at the time of procedure completion. Electronically Signed   By: Simonne Come M.D.   On: 10/15/2023 14:42    Anti-infectives: Anti-infectives (From admission, onward)    Start     Dose/Rate Route Frequency Ordered Stop   10/15/23 1800  vancomycin (VANCOCIN) IVPB 1000 mg/200 mL premix        1,000 mg 200 mL/hr over 60 Minutes Intravenous Every 24 hours 10/15/23 1200     10/14/23 2000  cefTRIAXone (ROCEPHIN) 2 g in sodium chloride 0.9 % 100 mL IVPB        2 g 200 mL/hr over 30 Minutes Intravenous Every 24 hours 10/14/23 1233     10/14/23 1800  vancomycin (VANCOREADY) IVPB 750 mg/150 mL  Status:  Discontinued        750 mg 150 mL/hr over 60 Minutes Intravenous Every 24 hours 10/13/23 1618 10/15/23 1200   10/14/23 0400  aztreonam (AZACTAM) 2 g in sodium chloride 0.9 % 100 mL IVPB  Status:  Discontinued        2 g 200 mL/hr over 30 Minutes Intravenous Every 8 hours 10/13/23 1653 10/14/23 1233   10/13/23 1800  metroNIDAZOLE (FLAGYL) IVPB 500 mg        500 mg 100 mL/hr over 60  Minutes Intravenous Every 12 hours 10/13/23 1622     10/13/23 1630  vancomycin (VANCOREADY) IVPB 1500 mg/300 mL        1,500 mg 150 mL/hr over 120 Minutes Intravenous  Once 10/13/23 1617 10/13/23 2011   10/13/23 1630  aztreonam (AZACTAM) 2 g in sodium chloride 0.9 % 100 mL IVPB        2 g 200 mL/hr over 30 Minutes Intravenous  Once 10/13/23 1620 10/13/23 1740       Assessment/Plan: Impression: Status post percutaneous drainage of right iliopsoas abscess.  There is no growth in 24 hours from culture.  Follow-up CT scan tomorrow to assess abscess cavity.  LOS: 4 days    Franky Macho 10/17/2023

## 2023-10-17 NOTE — Progress Notes (Signed)
Katrinka Blazing, M.D. Gastroenterology & Hepatology   Interval History:  Patient reports feeling well. Denies having any more rectal bleeding, stool has been brown in color x 2.  No more constipation.  No abdominal distention but has some abdominal discomfort, no fever, chills or nausea/vomiting. Still with some mild drainage in drain.  Inpatient Medications:  Current Facility-Administered Medications:    acetaminophen (TYLENOL) tablet 650 mg, 650 mg, Oral, Q6H PRN **OR** acetaminophen (TYLENOL) suppository 650 mg, 650 mg, Rectal, Q6H PRN, Emokpae, Ejiroghene E, MD   cefTRIAXone (ROCEPHIN) 2 g in sodium chloride 0.9 % 100 mL IVPB, 2 g, Intravenous, Q24H, Earnie Larsson, RPH, Last Rate: 200 mL/hr at 10/16/23 2018, 2 g at 10/16/23 2018   ipratropium-albuterol (DUONEB) 0.5-2.5 (3) MG/3ML nebulizer solution 3 mL, 3 mL, Nebulization, Q4H PRN, Sherryll Burger, Pratik D, DO, 3 mL at 10/17/23 0330   losartan (COZAAR) tablet 12.5 mg, 12.5 mg, Oral, Daily, Emokpae, Ejiroghene E, MD, 12.5 mg at 10/17/23 0909   magnesium sulfate IVPB 2 g 50 mL, 2 g, Intravenous, Once, Sherryll Burger, Pratik D, DO, Last Rate: 50 mL/hr at 10/17/23 1100, 2 g at 10/17/23 1100   metoprolol succinate (TOPROL-XL) 24 hr tablet 50 mg, 50 mg, Oral, Daily, Emokpae, Ejiroghene E, MD, 50 mg at 10/17/23 0908   metroNIDAZOLE (FLAGYL) IVPB 500 mg, 500 mg, Intravenous, Q12H, Emokpae, Ejiroghene E, MD, Last Rate: 100 mL/hr at 10/17/23 0630, 500 mg at 10/17/23 0630   mirtazapine (REMERON) tablet 7.5 mg, 7.5 mg, Oral, QHS, Emokpae, Ejiroghene E, MD, 7.5 mg at 10/16/23 2022   montelukast (SINGULAIR) tablet 10 mg, 10 mg, Oral, QHS, Emokpae, Ejiroghene E, MD, 10 mg at 10/16/23 2022   morphine (PF) 2 MG/ML injection 2 mg, 2 mg, Intravenous, Q4H PRN, Emokpae, Ejiroghene E, MD, 2 mg at 10/16/23 2314   ondansetron (ZOFRAN) tablet 4 mg, 4 mg, Oral, Q6H PRN **OR** ondansetron (ZOFRAN) injection 4 mg, 4 mg, Intravenous, Q6H PRN, Emokpae, Ejiroghene E, MD   oxyCODONE  (Oxy IR/ROXICODONE) immediate release tablet 5 mg, 5 mg, Oral, Q6H PRN, Sherryll Burger, Pratik D, DO, 5 mg at 10/17/23 0326   pantoprazole (PROTONIX) injection 40 mg, 40 mg, Intravenous, Q12H, Shah, Pratik D, DO, 40 mg at 10/17/23 1054   polyethylene glycol (MIRALAX / GLYCOLAX) packet 17 g, 17 g, Oral, BID, Marguerita Merles, Masai Kidd, MD, 17 g at 10/17/23 0909   torsemide (DEMADEX) tablet 40 mg, 40 mg, Oral, BID, Sherryll Burger, Pratik D, DO, 40 mg at 10/17/23 0908   vancomycin (VANCOCIN) IVPB 1000 mg/200 mL premix, 1,000 mg, Intravenous, Q24H, Orson Aloe, [facility], Last Rate: 200 mL/hr at 10/16/23 1916, 1,000 mg at 10/16/23 1916   I/O    Intake/Output Summary (Last 24 hours) at 10/17/2023 1137 Last data filed at 10/17/2023 0900 Gross per 24 hour  Intake 720 ml  Output 10 ml  Net 710 ml     Physical Exam: Temp:  [97.9 F (36.6 C)-98.6 F (37 C)] 98.6 F (37 C) (01/26 0400) Pulse Rate:  [100-112] 105 (01/26 0400) Resp:  [17-20] 20 (01/26 0400) BP: (117-143)/(54-84) 125/63 (01/26 0400) SpO2:  [95 %-100 %] 100 % (01/26 0400)  Temp (24hrs), Avg:98.3 F (36.8 C), Min:97.9 F (36.6 C), Max:98.6 F (37 C) GENERAL: The patient is AO x3, in no acute distress. HEENT: Head is normocephalic and atraumatic. EOMI are intact. Mouth is well hydrated and without lesions. NECK: Supple. No masses LUNGS: Clear to auscultation. No presence of rhonchi/wheezing/rales. Adequate chest expansion HEART: RRR, normal s1 and s2.  ABDOMEN: Soft, nontender, no guarding, no peritoneal signs, and nondistended. BS +. No masses. Has presence of a drain with catheter bag in the right flank area with serosanguineous contents. EXTREMITIES: Without any cyanosis, clubbing, rash, lesions or edema. NEUROLOGIC: AOx3, no focal motor deficit. SKIN: no jaundice, no rashes  Laboratory Data: CBC:     Component Value Date/Time   WBC 11.9 (H) 10/17/2023 0427   RBC 2.66 (L) 10/17/2023 0427   HGB 8.5 (L) 10/17/2023 0427   HCT 28.2 (L)  10/17/2023 0427   PLT 454 (H) 10/17/2023 0427   MCV 106.0 (H) 10/17/2023 0427   MCH 32.0 10/17/2023 0427   MCHC 30.1 10/17/2023 0427   RDW 14.0 10/17/2023 0427   LYMPHSABS 2.5 10/05/2023 1300   MONOABS 0.8 10/05/2023 1300   EOSABS 0.2 10/05/2023 1300   BASOSABS 0.1 10/05/2023 1300   COAG:  Lab Results  Component Value Date   INR 1.0 10/10/2021   INR 1.09 09/07/2011    BMP:     Latest Ref Rng & Units 10/17/2023    4:27 AM 10/16/2023    4:50 AM 10/15/2023    4:01 AM  BMP  Glucose 70 - 99 mg/dL 409  811  914   BUN 8 - 23 mg/dL 5  5  9    Creatinine 0.44 - 1.00 mg/dL 7.82  9.56  2.13   Sodium 135 - 145 mmol/L 144  143  142   Potassium 3.5 - 5.1 mmol/L 3.2  3.9  3.4   Chloride 98 - 111 mmol/L 104  111  108   CO2 22 - 32 mmol/L 28  25  21    Calcium 8.9 - 10.3 mg/dL 8.2  8.1  7.5     HEPATIC:     Latest Ref Rng & Units 10/05/2023    1:00 PM 06/14/2022    6:00 AM 06/13/2022    3:45 AM  Hepatic Function  Total Protein 6.5 - 8.1 g/dL 7.5  4.8  4.8   Albumin 3.5 - 5.0 g/dL 2.6  2.2  2.3   AST 15 - 41 U/L 14  31  32   ALT 0 - 44 U/L 8  43  52   Alk Phosphatase 38 - 126 U/L 185  51  54   Total Bilirubin 0.0 - 1.2 mg/dL 0.8  1.2  1.3     CARDIAC:  Lab Results  Component Value Date   CKTOTAL 78 06/11/2022   CKMB 4.3 (H) 09/07/2011   TROPONINI <0.30 09/07/2011      Imaging: I personally reviewed and interpreted the available labs, imaging and endoscopic files.   Assessment/Plan: CONSANDRA LASKE is a 73 y.o. female with history of anxiety, CVA, GERD, hyperlipidemia, hypertension, history of myocardial infarction, COPD on oxygen 3 L/min, who was admitted to the hospital after presenting worsening right flank pain.  Patient was found to have sepsis secondary to iliopsoas abscess and underwent drain placement by interventional radiology on 10/15/2023.  Gastroenterology was consulted for evaluation of rectal bleeding.   The patient presented new onset of rectal bleeding in the  setting of constipation while hospitalized.  This was only limited to 2 episodes of rectal bleeding.  She has not presented significant drop in her hemoglobin and her DRE  was completely normal, except for the presence of boggy mucosa raising concern for internal hemorrhoids.   Patient presented transient episode of lower gastrointestinal bleeding which has resolved.  She has not had a colonoscopy in 9 years and  her last colonoscopy had a suboptimal preparation.  Ideally, we will proceed with a colonoscopy as outpatient to evaluate her symptoms further, especially as she has a drain in place which may limit her ability to move to a commode while undergoing a bowel preparation.  I discussed this with the patient who is in agreement to monitor her symptoms for now and undergo an eventual colonoscopy as outpatient.   She will need to continue with MiraLAX twice daily regimen to avoid any more constipation episodes.   -Advance diet as tolerated -Check H&H every day -Continue with MiraLAX twice daily -If possible, encourage ambulation -Possible outpatient colonoscopy in 4 to 6 weeks - Patient will follow up in GI clinic in 3-4 weeks. - GI service will sign-off, please call us back if you have any more questions.  Katrinka Blazing, MD Gastroenterology and Hepatology [facility] Gastroenterology

## 2023-10-17 NOTE — Telephone Encounter (Signed)
Hi Mitzie,  Can you please schedule a follow up appointment for this patient in 3-4 weeks with me or any of the APPs?  Thanks,  Katrinka Blazing, MD Gastroenterology and Hepatology [facility] Gastroenterology

## 2023-10-17 NOTE — Plan of Care (Signed)
Problem: Education: Goal: Knowledge of General Education information will improve Description Including pain rating scale, medication(s)/side effects and non-pharmacologic comfort measures Outcome: Progressing   Problem: Health Behavior/Discharge Planning: Goal: Ability to manage health-related needs will improve Outcome: Progressing

## 2023-10-17 NOTE — Progress Notes (Signed)
PHARMACIST - PHYSICIAN COMMUNICATION CONCERNING: IV to Oral Route Change Policy  RECOMMENDATION: This patient is receiving pantoprazole intravenously. Based on criteria approved by the Pharmacy and Therapeutics Committee, the intravenous medication(s) is/are being converted to the equivalent dose of an oral formulation.   DESCRIPTION: These criteria include: The patient is eating (either orally or via tube) and/or has been taking other orally administered medications for at least 24 hours. The patient has no evidence of active gastrointestinal bleeding or malabsorptive GI state (gastrectomy; short bowel; patient on TNA or NPO).  If you have questions about this conversion, please contact the Pharmacy Department:  []   [phone] )  [facility] [x]   [phone] )  [facility] []   [phone] )  [facility] []   [phone] )  [facility]   Will M. Dareen Piano, PharmD Clinical Pharmacist 10/17/2023 4:02 PM

## 2023-10-17 NOTE — Progress Notes (Signed)
   10/17/23 1426  Assess: MEWS Score  Temp 98.4 F (36.9 C)  BP 96/61  MAP (mmHg) 72  Pulse Rate (!) 101  Resp 18  SpO2 100 %  O2 Device Nasal Cannula  O2 Flow Rate (L/min) 4.5 L/min  Assess: MEWS Score  MEWS Temp 0  MEWS Systolic 1  MEWS Pulse 1  MEWS RR 0  MEWS LOC 0  MEWS Score 2  MEWS Score Color Yellow  Assess: if the MEWS score is Yellow or Red  Were vital signs accurate and taken at a resting state? No, vital signs rechecked  Does the patient meet 2 or more of the SIRS criteria? No  MEWS guidelines implemented  Yes, yellow  Treat  MEWS Interventions Considered administering scheduled or prn medications/treatments as ordered  Take Vital Signs  Increase Vital Sign Frequency  Yellow: Q2hr x1, continue Q4hrs until patient remains green for 12hrs  Escalate  MEWS: Escalate Yellow: Discuss with charge nurse and consider notifying provider and/or RRT  Notify: Charge Nurse/RN  Name of Charge Nurse/RN Notified Chales Abrahams RN  Assess: SIRS CRITERIA  SIRS Temperature  0  SIRS Respirations  0  SIRS Pulse 1  SIRS WBC 0  SIRS Score Sum  1

## 2023-10-17 NOTE — Progress Notes (Signed)
PROGRESS NOTE    [name]  EXB:[id number] DOB: 09/09/1951 DOA: 10/13/2023 PCP: Benetta Spar, MD   Brief Narrative:    [name] is a 73 y.o. 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 Patient presented to the ED with complaints of right flank pain and swelling that started November.  She reports onset of drainage from her right flank yesterday.  Patient was admitted for sepsis, POA secondary to iliopsoas abscess and patient underwent drain placement with IR on 1/24.  General surgery following with plans for repeat CT scan 1/27.  There is now concern for rectal bleeding and GI consulted as well.  Assessment & Plan:   Principal Problem:   Iliopsoas abscess (HCC) Active Problems:   Sepsis (HCC)   Hypokalemia   Chronic hypoxic respiratory failure (HCC)   Chronic systolic CHF (congestive heart failure) (HCC)   Essential hypertension   COPD GOLD ?   Rectal bleeding  Assessment and Plan:   Sepsis, POA, secondary to iliopsoas abscess (HCC) Presenting with likely early sepsis, WBC-11.4, with tachycardia heart rate 95-104.  Tmax 98.7.  MRI abdomen with and without contrast 10/10/2023-  Multiple complex fluid collections in the right iliopsoas musculature and right pelvic sidewall, with fistulous tracts extending into the right flank musculature and subcutaneous fat of the right flank, as above, concerning for abscesses. -Continue IV antibiotics with plan for repeat CT 1/27 -Status post drain placement with IR 1/24 - HgbA1c 6.0%  Chronic systolic CHF (congestive heart failure) (HCC) Stable and compensated.  Last echo 03/2023 EF of 50%, showing improved EF from 35 to 40%. -In setting of early sepsis, hold torsemide 40 mg twice daily for now, resume when able -Clear liquid diet for now -Hold Plavix -Resume metoprolol, losartan -Resumed home torsemide 1/25, follow-up chest  x-ray -Use breathing treatments as needed  Rectal bleed with possible acute blood loss anemia -Continue to monitor hemoglobin levels and transfuse for hemoglobin less than 7 -PPI twice daily -Clear liquid diet -Appreciate GI evaluation  Hypokalemia/Hypomagnesia -Replete and re-evaluate in am  COPD GOLD ? 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  Stable.  No wheezing.    DVT prophylaxis: SCDs Code Status: Full Family Communication: None at bedside Disposition Plan: Plan for drain placement per IR 1/24 Status is: Inpatient Remains inpatient appropriate because: Need for IV medications.   Consultants:  IR General surgery GI  Procedures:  None  Antimicrobials:  Anti-infectives (From admission, onward)    Start     Dose/Rate Route Frequency Ordered Stop   10/15/23 1800  vancomycin (VANCOCIN) IVPB 1000 mg/200 mL premix        1,000 mg 200 mL/hr over 60 Minutes Intravenous Every 24 hours 10/15/23 1200     10/14/23 2000  cefTRIAXone (ROCEPHIN) 2 g in sodium chloride 0.9 % 100 mL IVPB        2 g 200 mL/hr over 30 Minutes Intravenous Every 24 hours 10/14/23 1233     10/14/23 1800  vancomycin (VANCOREADY) IVPB 750 mg/150 mL  Status:  Discontinued        750 mg 150 mL/hr over 60 Minutes Intravenous Every 24 hours 10/13/23 1618 10/15/23 1200   10/14/23 0400  aztreonam (AZACTAM) 2 g in sodium chloride 0.9 % 100 mL IVPB  Status:  Discontinued        2 g 200 mL/hr over 30 Minutes Intravenous Every 8 hours 10/13/23 1653 10/14/23 1233  10/13/23 1800  metroNIDAZOLE (FLAGYL) IVPB 500 mg        500 mg 100 mL/hr over 60 Minutes Intravenous Every 12 hours 10/13/23 1622     10/13/23 1630  vancomycin (VANCOREADY) IVPB 1500 mg/300 mL        1,500 mg 150 mL/hr over 120 Minutes Intravenous  Once 10/13/23 1617 10/13/23 2011   10/13/23 1630  aztreonam (AZACTAM) 2 g in sodium chloride 0.9 % 100 mL IVPB        2 g 200 mL/hr over 30 Minutes Intravenous  Once 10/13/23 1620 10/13/23 1740        Subjective: Patient seen and evaluated today with some dyspnea and hypoxemia noted overnight.  Resumed home torsemide dose and given breathing treatments and she is feeling somewhat better this morning.  Objective: Vitals:   10/16/23 2130 10/17/23 0112 10/17/23 0300 10/17/23 0400  BP: (!) 131/58 (!) 143/62  125/63  Pulse: (!) 107 (!) 108  (!) 105  Resp:   17 20  Temp: 98.4 F (36.9 C) 98.2 F (36.8 C)  98.6 F (37 C)  TempSrc: Oral Oral  Oral  SpO2: 100% 100%  100%  Weight:      Height:        Intake/Output Summary (Last 24 hours) at 10/17/2023 1223 Last data filed at 10/17/2023 0900 Gross per 24 hour  Intake 720 ml  Output 10 ml  Net 710 ml   Filed Weights   10/13/23 1522 10/13/23 2354  Weight: 63.5 kg 60.9 kg    Examination:  General exam: Appears calm and comfortable  Respiratory system: Clear to auscultation. Respiratory effort normal.  4.5 L nasal cannula Cardiovascular system: S1 & S2 heard, RRR.  Gastrointestinal system: Abdomen is soft, right flank with bag present with serosanguineous drainage. Central nervous system: Alert and awake Extremities: No edema Skin: No significant lesions noted Psychiatry: Flat affect.    Data Reviewed: I have personally reviewed following labs and imaging studies  CBC: Recent Labs  Lab 10/13/23 1608 10/14/23 0216 10/15/23 0401 10/15/23 1635 10/16/23 0450 10/16/23 1004 10/17/23 0427  WBC 11.4* 15.8* 12.3*  --  12.4*  --  11.9*  HGB 9.8* 9.2* 9.3* 9.2* 8.1* 8.4* 8.5*  HCT 32.4* 30.5* 32.0* 31.4* 28.3* 28.2* 28.2*  MCV 105.9* 105.9* 107.7*  --  106.8*  --  106.0*  PLT 504* 487* 521*  --  439*  --  454*   Basic Metabolic Panel: Recent Labs  Lab 10/13/23 1608 10/14/23 0216 10/15/23 0401 10/16/23 0450 10/17/23 0427  NA 141 142 142 143 144  K 3.4* 3.5 3.4* 3.9 3.2*  CL 99 103 108 111 104  CO2 28 25 21* 25 28  GLUCOSE 176* 111* 165* 154* 154*  BUN 15 15 9  5* <5*  CREATININE 0.92 0.83 0.74 0.65 0.81   CALCIUM 7.8* 7.4* 7.5* 8.1* 8.2*  MG 1.0*  --  1.1* 1.8 1.4*   GFR: Estimated Creatinine Clearance: 49.7 mL/min (by C-G formula based on SCr of 0.81 mg/dL). Liver Function Tests: No results for input(s): "AST", "ALT", "ALKPHOS", "BILITOT", "PROT", "ALBUMIN" in the last 168 hours. No results for input(s): "LIPASE", "AMYLASE" in the last 168 hours. No results for input(s): "AMMONIA" in the last 168 hours. Coagulation Profile: No results for input(s): "INR", "PROTIME" in the last 168 hours. Cardiac Enzymes: No results for input(s): "CKTOTAL", "CKMB", "CKMBINDEX", "TROPONINI" in the last 168 hours. BNP (last 3 results) No results for input(s): "PROBNP" in the last 8760 hours. HbA1C: No  results for input(s): "HGBA1C" in the last 72 hours.  CBG: No results for input(s): "GLUCAP" in the last 168 hours. Lipid Profile: No results for input(s): "CHOL", "HDL", "LDLCALC", "TRIG", "CHOLHDL", "LDLDIRECT" in the last 72 hours. Thyroid Function Tests: No results for input(s): "TSH", "T4TOTAL", "FREET4", "T3FREE", "THYROIDAB" in the last 72 hours. Anemia Panel: No results for input(s): "VITAMINB12", "FOLATE", "FERRITIN", "TIBC", "IRON", "RETICCTPCT" in the last 72 hours. Sepsis Labs: Recent Labs  Lab 10/13/23 2135 10/14/23 0216  LATICACIDVEN 2.1* 2.7*    Recent Results (from the past 240 hours)  Blood culture (routine x 2)     Status: None (Preliminary result)   Collection Time: 10/13/23  4:54 PM   Specimen: BLOOD LEFT WRIST  Result Value Ref Range Status   Specimen Description BLOOD LEFT WRIST  Final   Special Requests   Final    BOTTLES DRAWN AEROBIC AND ANAEROBIC Blood Culture adequate volume   Culture   Final    NO GROWTH 4 DAYS Performed at [facility], [address] [place], [place] [address]    Report Status PENDING  Incomplete  Blood culture (routine x 2)     Status: None (Preliminary result)   Collection Time: 10/13/23  4:54 PM   Specimen: BLOOD LEFT WRIST  Result Value  Ref Range Status   Specimen Description BLOOD LEFT WRIST  Final   Special Requests   Final    BOTTLES DRAWN AEROBIC AND ANAEROBIC Blood Culture adequate volume   Culture   Final    NO GROWTH 4 DAYS Performed at [facility], [address], [place], [place] [address]    Report Status PENDING  Incomplete  Aerobic/Anaerobic Culture w Gram Stain (surgical/deep wound)     Status: None (Preliminary result)   Collection Time: 10/15/23  2:13 PM   Specimen: Abscess  Result Value Ref Range Status   Specimen Description ABSCESS  Final   Special Requests POST GUIDED DRAIN  Final   Gram Stain   Final    MODERATE WBC PRESENT, PREDOMINANTLY PMN NO ORGANISMS SEEN    Culture   Final    NO GROWTH < 24 HOURS Performed at [facility], [address] [place], [place] [address]    Report Status PENDING  Incomplete         Radiology Studies: CT GUIDED PERITONEAL/RETROPERITONEAL FLUID DRAIN BY [facility] CATH Result Date: 10/15/2023 INDICATION: Right abdominal/pelvic abscess. Right flank abscess. Please perform image guided drainage catheter placement for infection source control purposes. EXAM: CT PERC DRAIN PERITONEAL ABCESS COMPARISON:  CT abdomen pelvis-10/04/2022; 05/02/2022; 10/09/2021 Abdominal MRI-10/10/2023 MEDICATIONS: The patient is currently admitted to the hospital and receiving intravenous antibiotics. The antibiotics were administered within an appropriate time frame prior to the initiation of the procedure. ANESTHESIA/SEDATION: Moderate (conscious) sedation was employed during this procedure. A total of Versed 1 mg and Fentanyl 25 mcg was administered intravenously. Moderate Sedation Time: 12 minutes. The patient's level of consciousness and vital signs were monitored continuously by radiology nursing throughout the procedure under my direct supervision. CONTRAST:  None COMPLICATIONS: None immediate. PROCEDURE: RADIATION DOSE REDUCTION: This exam was performed according to the departmental  dose-optimization program which includes automated exposure control, adjustment of the mA and/or kV according to patient size and/or use of iterative reconstruction technique. Informed written consent was obtained from the patient after a discussion of the risks, benefits and alternatives to treatment. The patient was placed supine, slightly LPO on the CT gantry and a pre procedural CT was performed  re-demonstrating the known abscess/fluid collection within the right lower abdomen with dominant component measuring at least 4.7 x 3.0 cm (image 48, series 2). 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 The procedure was planned. A timeout was performed prior to the initiation of the procedure. The skin overlying the inferolateral aspect of the right lower abdomen/pelvis was prepped and draped in the usual sterile fashion. The overlying soft tissues were anesthetized with 1% lidocaine with epinephrine. Appropriate trajectory was planned with the use of a 22 gauge spinal needle. An 18 gauge trocar needle was advanced into the abscess/fluid collection and a short Amplatz super stiff wire was coiled within the collection. Appropriate positioning was confirmed with a limited CT scan. The tract was serially dilated allowing placement of a 10 [place] all-purpose drainage catheter. Appropriate positioning was confirmed with a limited postprocedural CT scan. Approximately 30 ml of purulent fluid was aspirated. The tube was connected to a drainage bag and sutured in place. A dressing was applied. The patient tolerated the procedure well without immediate post procedural complication. IMPRESSION: Successful CT guided placement of a 10 French all purpose drain catheter into the right lower abdominal/pelvic abscess with aspiration of 30 mL of purulent fluid. Samples were sent to the laboratory as requested by the  ordering clinical team. Review of preprocedural noncontrast CT scan of the abdomen pelvis performed 10/05/2023 demonstrates a punctate (4 mm) opacity within the right lower abdomen/pelvis (image 50, series 2) which is nonspecific though favored to represent either a dropped gallstone versus an appendicolith. Note, patient denies history of previous appendectomy however the appendix is not visualized and a surgical clip is noted adjacent to the cecum. Concern that this complex right abdominal/pelvic and body wall abscess is attributable to either a dropped gallstone or appendicolith was discussed with referring surgeon, Dr. Lovell Sheehan, at the time of procedure completion. Electronically Signed   By: Simonne Come M.D.   On: 10/15/2023 14:42        Scheduled Meds:  losartan  12.5 mg Oral Daily   metoprolol succinate  50 mg Oral Daily   mirtazapine  7.5 mg Oral QHS   montelukast  10 mg Oral QHS   pantoprazole (PROTONIX) IV  40 mg Intravenous Q12H   polyethylene glycol  17 g Oral BID   torsemide  40 mg Oral BID   Continuous Infusions:  cefTRIAXone (ROCEPHIN)  IV 2 g (10/16/23 2018)   metronidazole 500 mg (10/17/23 0630)   vancomycin 1,000 mg (10/16/23 1916)     LOS: 4 days    Time spent: 55 minutes    Ronisha Herringshaw D Sherryll Burger, DO [facility]  If 7PM-7AM, please contact night-coverage [email or web address] 10/17/2023, 12:23 PM

## 2023-10-17 NOTE — Plan of Care (Signed)
Problem: Education: Goal: Knowledge of General Education information will improve Description: Including pain rating scale, medication(s)/side effects and non-pharmacologic comfort measures Outcome: Progressing   Problem: Clinical Measurements: Goal: Ability to maintain clinical measurements within normal limits will improve Outcome: Progressing   Problem: Clinical Measurements: Goal: Diagnostic test results will improve Outcome: Progressing   Problem: Clinical Measurements: Goal: Respiratory complications will improve Outcome: Progressing

## 2023-10-18 ENCOUNTER — Inpatient Hospital Stay (HOSPITAL_COMMUNITY): Payer: Medicare Other

## 2023-10-18 DIAGNOSIS — K6812 Psoas muscle abscess: Secondary | ICD-10-CM | POA: Diagnosis not present

## 2023-10-18 LAB — CBC
HCT: 30.6 % — ABNORMAL LOW (ref 36.0–46.0)
Hemoglobin: 9.1 g/dL — ABNORMAL LOW (ref 12.0–15.0)
MCH: 31.4 pg (ref 26.0–34.0)
MCHC: 29.7 g/dL — ABNORMAL LOW (ref 30.0–36.0)
MCV: 105.5 fL — ABNORMAL HIGH (ref 80.0–100.0)
Platelets: 464 10*3/uL — ABNORMAL HIGH (ref 150–400)
RBC: 2.9 MIL/uL — ABNORMAL LOW (ref 3.87–5.11)
RDW: 14.3 % (ref 11.5–15.5)
WBC: 12.5 10*3/uL — ABNORMAL HIGH (ref 4.0–10.5)
nRBC: 0 % (ref 0.0–0.2)

## 2023-10-18 LAB — BASIC METABOLIC PANEL
Anion gap: 12 (ref 5–15)
BUN: <5 mg/dL — ABNORMAL LOW (ref 8–23)
CO2: 30 mmol/L (ref 22–32)
Calcium: 8 mg/dL — ABNORMAL LOW (ref 8.9–10.3)
Chloride: 99 mmol/L (ref 98–111)
Creatinine, Ser: 1 mg/dL (ref 0.44–1.00)
GFR, Estimated: 59 mL/min — ABNORMAL LOW (ref >60)
Glucose, Bld: 155 mg/dL — ABNORMAL HIGH (ref 70–99)
Potassium: 3 mmol/L — ABNORMAL LOW (ref 3.5–5.1)
Sodium: 141 mmol/L (ref 135–145)

## 2023-10-18 LAB — CULTURE, BLOOD (ROUTINE X 2)
Culture: NO GROWTH
Culture: NO GROWTH
Special Requests: ADEQUATE
Special Requests: ADEQUATE

## 2023-10-18 LAB — MAGNESIUM: Magnesium: 1.9 mg/dL (ref 1.7–2.4)

## 2023-10-18 MED ORDER — POTASSIUM CHLORIDE CRYS ER 20 MEQ PO TBCR
40.0000 meq | EXTENDED_RELEASE_TABLET | Freq: Two times a day (BID) | ORAL | Status: DC
  Administered 2023-10-18 – 2023-10-19 (×3): 40 meq via ORAL
  Filled 2023-10-18 (×3): qty 2

## 2023-10-18 NOTE — Progress Notes (Signed)
Patient VS trigger a yellow mews. HR 122. Patient was walking back from bathroom and became Short of Breath. Other than shortness of breath patient denies s/sx and is not in distress. Notified Charge RN Andria Rhein, RN and Dr. Sherryll Burger. No new orders. Yellow mews protocol initiated.      10/18/23 0926  Assess: MEWS Score  Temp 98 F (36.7 C)  BP (!) 141/72  MAP (mmHg) 85  Pulse Rate (!) 122  Resp 19  SpO2 100 %  O2 Device Nasal Cannula  O2 Flow Rate (L/min) 3 L/min  Assess: MEWS Score  MEWS Temp 0  MEWS Systolic 0  MEWS Pulse 2  MEWS RR 0  MEWS LOC 0  MEWS Score 2  MEWS Score Color Yellow  Assess: if the MEWS score is Yellow or Red  Were vital signs accurate and taken at a resting state? Yes  Does the patient meet 2 or more of the SIRS criteria? No  MEWS guidelines implemented  Yes, yellow  Treat  MEWS Interventions Considered administering scheduled or prn medications/treatments as ordered  Take Vital Signs  Increase Vital Sign Frequency  Yellow: Q2hr x1, continue Q4hrs until patient remains green for 12hrs  Escalate  MEWS: Escalate Yellow: Discuss with charge nurse and consider notifying provider and/or RRT  Notify: Charge Nurse/RN  Name of Charge Nurse/RN Notified Janan Ridge, RN  Provider Notification  Provider Name/Title Dr. Sherryll Burger  Date Provider Notified 10/18/23  Time Provider Notified 1122  Method of Notification Page  Notification Reason Other (Comment) (yellow mews)  Provider response No new orders  Date of Provider Response 10/18/23  Time of Provider Response 1122  Assess: SIRS CRITERIA  SIRS Temperature  0  SIRS Respirations  0  SIRS Pulse 1  SIRS WBC 0  SIRS Score Sum  1

## 2023-10-18 NOTE — Progress Notes (Signed)
PROGRESS NOTE    [name]  ZOX:[id number] DOB: [birth year] DOA: 10/13/2023 PCP: Benetta Spar, MD   Brief Narrative:    [name] is a 73 y.o. 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 Patient presented to the ED with complaints of right flank pain and swelling that started November.  She reports onset of drainage from her right flank yesterday.  Patient was admitted for sepsis, POA secondary to iliopsoas abscess and patient underwent drain placement with IR on 1/24.  General surgery following with plans for repeat CT scan 1/27.  There is now concern for rectal bleeding and GI consulted as well.  Assessment & Plan:   Principal Problem:   Iliopsoas abscess (HCC) Active Problems:   Sepsis (HCC)   Hypokalemia   Chronic hypoxic respiratory failure (HCC)   Chronic systolic CHF (congestive heart failure) (HCC)   Essential hypertension   COPD GOLD ?   Rectal bleeding  Assessment and Plan:   Sepsis, POA, secondary to iliopsoas abscess (HCC) Presenting with likely early sepsis, WBC-11.4, with tachycardia heart rate 95-104.  Tmax 98.7.  MRI abdomen with and without contrast 10/10/2023-  Multiple complex fluid collections in the right iliopsoas musculature and right pelvic sidewall, with fistulous tracts extending into the right flank musculature and subcutaneous fat of the right flank, as above, concerning for abscesses. -Continue IV antibiotics with plan for repeat CT 1/27 -Status post drain placement with IR 1/24 - HgbA1c 6.0%  Chronic systolic CHF (congestive heart failure) (HCC) Stable and compensated.  Last echo 03/2023 EF of 50%, showing improved EF from 35 to 40%. -In setting of early sepsis, hold torsemide 40 mg twice daily for now, resume when able -Clear liquid diet for now -Hold Plavix -Resume metoprolol, losartan -Resumed home torsemide 1/25, follow-up chest  x-ray -Use breathing treatments as needed  Rectal bleed with possible acute blood loss anemia -Continue to monitor hemoglobin levels and transfuse for hemoglobin less than 7 -PPI twice daily -Clear liquid diet -Appreciate GI evaluation  Hypokalemia/Hypomagnesia -Replete and re-evaluate in am  COPD GOLD ? 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  Stable.  No wheezing.    DVT prophylaxis: SCDs Code Status: Full Family Communication: None at bedside Disposition Plan:  Status is: Inpatient Remains inpatient appropriate because: Need for IV medications.   Consultants:  IR General surgery GI  Procedures:  None  Antimicrobials:  Anti-infectives (From admission, onward)    Start     Dose/Rate Route Frequency Ordered Stop   10/15/23 1800  vancomycin (VANCOCIN) IVPB 1000 mg/200 mL premix        1,000 mg 200 mL/hr over 60 Minutes Intravenous Every 24 hours 10/15/23 1200     10/14/23 2000  cefTRIAXone (ROCEPHIN) 2 g in sodium chloride 0.9 % 100 mL IVPB        2 g 200 mL/hr over 30 Minutes Intravenous Every 24 hours 10/14/23 1233     10/14/23 1800  vancomycin (VANCOREADY) IVPB 750 mg/150 mL  Status:  Discontinued        750 mg 150 mL/hr over 60 Minutes Intravenous Every 24 hours 10/13/23 1618 10/15/23 1200   10/14/23 0400  aztreonam (AZACTAM) 2 g in sodium chloride 0.9 % 100 mL IVPB  Status:  Discontinued        2 g 200 mL/hr over 30 Minutes Intravenous Every 8 hours 10/13/23 1653 10/14/23 1233   10/13/23 1800  metroNIDAZOLE (FLAGYL)  IVPB 500 mg        500 mg 100 mL/hr over 60 Minutes Intravenous Every 12 hours 10/13/23 1622     10/13/23 1630  vancomycin (VANCOREADY) IVPB 1500 mg/300 mL        1,500 mg 150 mL/hr over 120 Minutes Intravenous  Once 10/13/23 1617 10/13/23 2011   10/13/23 1630  aztreonam (AZACTAM) 2 g in sodium chloride 0.9 % 100 mL IVPB        2 g 200 mL/hr over 30 Minutes Intravenous  Once 10/13/23 1620 10/13/23 1740       Subjective: Patient seen  and evaluated today with no significant complaints or concerns noted.  Objective: Vitals:   10/17/23 2200 10/17/23 2312 10/18/23 0244 10/18/23 0926  BP: (!) 99/54 (!) 120/59 (!) 98/50 (!) 141/72  Pulse: (!) 103 (!) 107 99 (!) 122  Resp: 20 17 18 19   Temp: (!) 97.5 F (36.4 C)  98 F (36.7 C) 98 F (36.7 C)  TempSrc: Oral   Oral  SpO2: 100% 100% 100% 100%  Weight:      Height:        Intake/Output Summary (Last 24 hours) at 10/18/2023 0940 Last data filed at 10/17/2023 2100 Gross per 24 hour  Intake 960 ml  Output 15 ml  Net 945 ml   Filed Weights   10/13/23 1522 10/13/23 2354  Weight: 63.5 kg 60.9 kg    Examination:  General exam: Appears calm and comfortable  Respiratory system: Clear to auscultation. Respiratory effort normal.  3 L nasal cannula Cardiovascular system: S1 & S2 heard, RRR.  Gastrointestinal system: Abdomen is soft, right flank with bag present with serosanguineous drainage. Central nervous system: Alert and awake Extremities: No edema Skin: No significant lesions noted Psychiatry: Flat affect.    Data Reviewed: I have personally reviewed following labs and imaging studies  CBC: Recent Labs  Lab 10/14/23 0216 10/15/23 0401 10/15/23 1635 10/16/23 0450 10/16/23 1004 10/17/23 0427 10/18/23 0420  WBC 15.8* 12.3*  --  12.4*  --  11.9* 12.5*  HGB 9.2* 9.3* 9.2* 8.1* 8.4* 8.5* 9.1*  HCT 30.5* 32.0* 31.4* 28.3* 28.2* 28.2* 30.6*  MCV 105.9* 107.7*  --  106.8*  --  106.0* 105.5*  PLT 487* 521*  --  439*  --  454* 464*   Basic Metabolic Panel: Recent Labs  Lab 10/13/23 1608 10/14/23 0216 10/15/23 0401 10/16/23 0450 10/17/23 0427 10/18/23 0420  NA 141 142 142 143 144 141  K 3.4* 3.5 3.4* 3.9 3.2* 3.0*  CL 99 103 108 111 104 99  CO2 28 25 21* 25 28 30   GLUCOSE 176* 111* 165* 154* 154* 155*  BUN 15 15 9  5* <5* <5*  CREATININE 0.92 0.83 0.74 0.65 0.81 1.00  CALCIUM 7.8* 7.4* 7.5* 8.1* 8.2* 8.0*  MG 1.0*  --  1.1* 1.8 1.4* 1.9    GFR: Estimated Creatinine Clearance: 40.3 mL/min (by C-G formula based on SCr of 1 mg/dL). Liver Function Tests: No results for input(s): "AST", "ALT", "ALKPHOS", "BILITOT", "PROT", "ALBUMIN" in the last 168 hours. No results for input(s): "LIPASE", "AMYLASE" in the last 168 hours. No results for input(s): "AMMONIA" in the last 168 hours. Coagulation Profile: No results for input(s): "INR", "PROTIME" in the last 168 hours. Cardiac Enzymes: No results for input(s): "CKTOTAL", "CKMB", "CKMBINDEX", "TROPONINI" in the last 168 hours. BNP (last 3 results) No results for input(s): "PROBNP" in the last 8760 hours. HbA1C: No results for input(s): "HGBA1C" in the last 72  hours.  CBG: No results for input(s): "GLUCAP" in the last 168 hours. Lipid Profile: No results for input(s): "CHOL", "HDL", "LDLCALC", "TRIG", "CHOLHDL", "LDLDIRECT" in the last 72 hours. Thyroid Function Tests: No results for input(s): "TSH", "T4TOTAL", "FREET4", "T3FREE", "THYROIDAB" in the last 72 hours. Anemia Panel: No results for input(s): "VITAMINB12", "FOLATE", "FERRITIN", "TIBC", "IRON", "RETICCTPCT" in the last 72 hours. Sepsis Labs: Recent Labs  Lab 10/13/23 2135 10/14/23 0216  LATICACIDVEN 2.1* 2.7*    Recent Results (from the past 240 hours)  Blood culture (routine x 2)     Status: None   Collection Time: 10/13/23  4:54 PM   Specimen: BLOOD LEFT WRIST  Result Value Ref Range Status   Specimen Description BLOOD LEFT WRIST  Final   Special Requests   Final    BOTTLES DRAWN AEROBIC AND ANAEROBIC Blood Culture adequate volume   Culture   Final    NO GROWTH 5 DAYS Performed at [facility], [address], [place], [place] [address]    Report Status 10/18/2023 FINAL  Final  Blood culture (routine x 2)     Status: None   Collection Time: 10/13/23  4:54 PM   Specimen: BLOOD LEFT WRIST  Result Value Ref Range Status   Specimen Description BLOOD LEFT WRIST  Final   Special Requests   Final    BOTTLES  DRAWN AEROBIC AND ANAEROBIC Blood Culture adequate volume   Culture   Final    NO GROWTH 5 DAYS Performed at [facility], [address], [place], [place] [address]    Report Status 10/18/2023 FINAL  Final  Aerobic/Anaerobic Culture w Gram Stain (surgical/deep wound)     Status: None (Preliminary result)   Collection Time: 10/15/23  2:13 PM   Specimen: Abscess  Result Value Ref Range Status   Specimen Description ABSCESS  Final   Special Requests POST GUIDED DRAIN  Final   Gram Stain   Final    MODERATE WBC PRESENT, PREDOMINANTLY PMN NO ORGANISMS SEEN    Culture   Final    NO GROWTH 3 DAYS NO ANAEROBES ISOLATED; CULTURE IN PROGRESS FOR 5 DAYS Performed at [facility], [address], [place], [place] [address]    Report Status PENDING  Incomplete         Radiology Studies: DG CHEST PORT 1 VIEW Result Date: 10/17/2023 CLINICAL DATA:  Dyspnea. EXAM: PORTABLE CHEST 1 VIEW COMPARISON:  06/12/2022 FINDINGS: Patient is rotated to the right. Heart size within normal limits. 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 The lungs are otherwise clear. IMPRESSION: Emphysema.  No acute findings. Electronically Signed   By: Danae Orleans M.D.   On: 10/17/2023 13:26        Scheduled Meds:  losartan  12.5 mg Oral Daily   metoprolol succinate  50 mg Oral Daily   mirtazapine  7.5 mg Oral QHS   montelukast  10 mg Oral QHS   pantoprazole  40 mg Oral BID   polyethylene glycol  17 g Oral BID   potassium chloride  40 mEq Oral BID   torsemide  40 mg Oral BID   Continuous Infusions:  cefTRIAXone (ROCEPHIN)  IV 2 g (10/17/23 1954)   metronidazole 500 mg (10/18/23 0525)   vancomycin 1,000 mg (10/17/23 1848)     LOS: 5 days    Time spent: 55 minutes    Maelle Sheaffer D Sherryll Burger, DO [facility]  If 7PM-7AM, please contact night-coverage [email or web address] 10/18/2023, 9:40 AM

## 2023-10-18 NOTE — Plan of Care (Signed)
Problem: Clinical Measurements: Goal: Ability to maintain clinical measurements within normal limits will improve Outcome: Progressing   Problem: Clinical Measurements: Goal: Will remain free from infection Outcome: Progressing   Problem: Clinical Measurements: Goal: Diagnostic test results will improve Outcome: Progressing

## 2023-10-18 NOTE — Plan of Care (Signed)

## 2023-10-19 DIAGNOSIS — K6812 Psoas muscle abscess: Secondary | ICD-10-CM | POA: Diagnosis not present

## 2023-10-19 LAB — BASIC METABOLIC PANEL
Anion gap: 9 (ref 5–15)
BUN: 5 mg/dL — ABNORMAL LOW (ref 8–23)
CO2: 32 mmol/L (ref 22–32)
Calcium: 7.9 mg/dL — ABNORMAL LOW (ref 8.9–10.3)
Chloride: 101 mmol/L (ref 98–111)
Creatinine, Ser: 1.17 mg/dL — ABNORMAL HIGH (ref 0.44–1.00)
GFR, Estimated: 49 mL/min — ABNORMAL LOW (ref >60)
Glucose, Bld: 149 mg/dL — ABNORMAL HIGH (ref 70–99)
Potassium: 3.6 mmol/L (ref 3.5–5.1)
Sodium: 142 mmol/L (ref 135–145)

## 2023-10-19 LAB — MAGNESIUM: Magnesium: 1.4 mg/dL — ABNORMAL LOW (ref 1.7–2.4)

## 2023-10-19 LAB — CBC
HCT: 30.7 % — ABNORMAL LOW (ref 36.0–46.0)
Hemoglobin: 9.1 g/dL — ABNORMAL LOW (ref 12.0–15.0)
MCH: 31.4 pg (ref 26.0–34.0)
MCHC: 29.6 g/dL — ABNORMAL LOW (ref 30.0–36.0)
MCV: 105.9 fL — ABNORMAL HIGH (ref 80.0–100.0)
Platelets: 462 10*3/uL — ABNORMAL HIGH (ref 150–400)
RBC: 2.9 MIL/uL — ABNORMAL LOW (ref 3.87–5.11)
RDW: 14.5 % (ref 11.5–15.5)
WBC: 13.6 10*3/uL — ABNORMAL HIGH (ref 4.0–10.5)
nRBC: 0 % (ref 0.0–0.2)

## 2023-10-19 MED ORDER — MAGNESIUM SULFATE 2 GM/50ML IV SOLN
2.0000 g | Freq: Once | INTRAVENOUS | Status: DC
  Filled 2023-10-19: qty 50

## 2023-10-19 MED ORDER — METRONIDAZOLE 500 MG PO TABS: 500.0000 mg | ORAL_TABLET | Freq: Two times a day (BID) | ORAL | 0 refills | Status: AC

## 2023-10-19 MED ORDER — MAGNESIUM OXIDE -MG SUPPLEMENT 400 (240 MG) MG PO TABS
400.0000 mg | ORAL_TABLET | Freq: Once | ORAL | Status: AC
  Administered 2023-10-19: 400 mg via ORAL
  Filled 2023-10-19: qty 1

## 2023-10-19 MED ORDER — CEFDINIR 300 MG PO CAPS: 300.0000 mg | ORAL_CAPSULE | Freq: Two times a day (BID) | ORAL | 0 refills | Status: AC

## 2023-10-19 NOTE — Progress Notes (Signed)
0630 contacted by central tele that patient's HR was up in the 150's. Pt was walking around the room coming back from the bathroom. SOB,stating "I can't breather, I can't get any air" Lungs tight with minimal air auscultated pt helped bak\ck to side of bed O2 noted to be off. Replaced [place] at 3 l on pt and placed bedside table near pt so she could lean over the table. Recovered within 15 minutes O2 sats 100% able to take slower deeper breaths andHR back down into the 120's. Contacted respiratory therapist for duoneb as ordered PRN. Pt states she feels much better. Continues to sit on side of bed leaning over bedside table.

## 2023-10-19 NOTE — Discharge Summary (Signed)
Physician Discharge Summary  [name] ZOX:[id number] DOB: [birth year] DOA: 10/13/2023  PCP: Benetta Spar, MD  Admit date: 10/13/2023  Discharge date: 10/19/2023  Admitted From:Home  Disposition:  Home  Recommendations for Outpatient Follow-up:  Follow up with PCP in 1-2 weeks Continue on antibiotics as prescribed for 5 more days to complete course of treatment Follow-up GI clinic in 3-4 weeks for possible outpatient colonoscopy in 4-6 weeks Continue on home medications as prior  Home Health: None  Equipment/Devices: None  Discharge Condition:Stable  CODE STATUS: Full  Diet recommendation: Heart Healthy  Brief/Interim Summary: [name] is a 73 y.o. 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 Patient presented to the ED with complaints of right flank pain and swelling that started November.  She reports onset of drainage from her right flank yesterday.  Patient was admitted for sepsis, POA secondary to iliopsoas abscess and patient underwent drain placement with IR on 1/24.  General surgery had recommended continuation of IV antibiotics with plans for repeat CT on 1/27 which was performed.  The abscess has now resolved and drain was removed on 1/28.  Plan is to continue oral antibiotics for several more days.  She was also noted to have some mild rectal bleeding during the course of this admission, but with no significant anemia.  She was seen by GI with plans for outpatient colonoscopy in several weeks.  No other acute events or concerns noted.  Discharge Diagnoses:  Principal Problem:   Iliopsoas abscess (HCC) Active Problems:   Sepsis (HCC)   Hypokalemia   Chronic hypoxic respiratory failure (HCC)   Chronic systolic CHF (congestive heart failure) (HCC)   Essential hypertension   COPD GOLD ?   Rectal bleeding  Principal discharge diagnosis: Iliopsoas abscess  status post drain placement.  Minimal rectal bleeding-resolved.  Discharge Instructions  Discharge Instructions     Diet - low sodium heart healthy   Complete by: As directed    Increase activity slowly   Complete by: As directed    No wound care   Complete by: As directed       Allergies as of 10/19/2023       Reactions   Aspirin Swelling, Other (See Comments)   Only in large doses will cause a reaction   Bee Venom Swelling   Severe swelling   Contrast Media [iodinated Contrast Media] Swelling   Lisinopril Swelling   Motrin [ibuprofen] Swelling   Penicillins Anaphylaxis   Dilaudid [hydromorphone Hcl] Itching        Medication List     TAKE these medications    acetaminophen 650 MG CR tablet Commonly known as: TYLENOL Take 1,300 mg by mouth every 8 (eight) hours as needed for pain.   cefdinir 300 MG capsule Commonly known as: OMNICEF Take 1 capsule (300 mg total) by mouth 2 (two) times daily for 5 days.   clopidogrel 75 MG tablet Commonly known as: PLAVIX Take 1 tablet (75 mg total) by mouth daily.   cyanocobalamin 500 MCG tablet Commonly known as: VITAMIN B12 Take 1 tablet (500 mcg total) by mouth daily.   folic acid 1 MG tablet Commonly known as: FOLVITE Take 1 tablet (1 mg total) by mouth daily.   Ipratropium-Albuterol 20-100 MCG/ACT Aers respimat Commonly known as: COMBIVENT Inhale 1 puff into the lungs every 6 (six) hours as needed for wheezing or shortness of breath.   losartan 25 MG tablet Commonly  known as: COZAAR Take 12.5 mg by mouth daily.   metoprolol succinate 50 MG 24 hr tablet Commonly known as: Toprol XL Take 1 tablet (50 mg total) by mouth daily. Take with or immediately following a meal.   metroNIDAZOLE 500 MG tablet Commonly known as: Flagyl Take 1 tablet (500 mg total) by mouth 2 (two) times daily for 5 days.   mirtazapine 7.5 MG tablet Commonly known as: REMERON Take 7.5 mg by mouth at bedtime.   montelukast 10 MG  tablet Commonly known as: SINGULAIR Take 10 mg by mouth at bedtime.   torsemide 20 MG tablet Commonly known as: DEMADEX Take 40 mg by mouth 2 (two) times daily.        Follow-up Information     [name], Wayland Salinas, MD. Schedule an appointment as soon as possible for a visit in 1 week(s).   Specialty: Internal Medicine Contact information: [address] [place] [address] [phone]                Allergies  Allergen Reactions   Aspirin Swelling and Other (See Comments)    Only in large doses will cause a reaction   Bee Venom Swelling    Severe swelling   Contrast Media [Iodinated Contrast Media] Swelling   Lisinopril Swelling   Motrin [Ibuprofen] Swelling   Penicillins Anaphylaxis   Dilaudid [Hydromorphone Hcl] Itching    Consultations: GI IR General Surgery   Procedures/Studies: CT ABDOMEN WO CONTRAST Result Date: 10/18/2023 CLINICAL DATA:  Psoas abscess, prior drainage. EXAM: CT ABDOMEN WITHOUT CONTRAST TECHNIQUE: Multidetector CT imaging of the abdomen was performed following the standard protocol without IV contrast. RADIATION DOSE REDUCTION: This exam was performed according to the departmental dose-optimization program which includes automated exposure control, adjustment of the mA and/or kV according to patient size and/or use of iterative reconstruction technique. COMPARISON:  Multiple exams, including procedural CT images from the drainage procedure on 10/15/2023 FINDINGS: Lower chest: Airway thickening in both lower lobes with centrilobular emphysema. Coronary and descending thoracic aortic atherosclerotic vascular calcifications. Hepatobiliary: Suspected hepatic cyst in the left hepatic lobe on image 8 series 2. This is similar to the 10/10/2023 MRI. Cholecystectomy. Pancreas: Unremarkable Spleen: Unremarkable Adrenals/Urinary Tract: Multiple renal cysts as depicted on the 10/10/2023 exam as benign. No further imaging workup of these  lesions is indicated. 6 mm fat density lesion of the medial limb of the left adrenal gland superiorly favoring a small myelolipoma. This is a benign lesion and warrants no further imaging workup. Stomach/Bowel: Unremarkable Vascular/Lymphatic: Atherosclerosis is present, including aortoiliac atherosclerotic disease. Atheromatous plaque proximally in the celiac trunk and superior mesenteric artery. Other: The right drainage catheter is noted with loop formed along the lateral margin of the right psoas just below the level of the iliac crests on image 45 series 2. This is at the site of prior retroperitoneal abscess. The previous retroperitoneal abscess has been drained and is substantially improved. The fistulous track is only faintly seen in the subcutaneous abscess component shown on MRI of 10/10/2023 is likewise markedly reduced with only a band of subcutaneous edema and overlying cutaneous thickening at the site of previous abscess. There continues to be some mild multilobular fluid density tracking along the right posterior renal fascia and along the margin of the right quadratus lumborum muscle for example images 22-34 of series 2. These may represent a small loculated abscess component. This measures about 2.5 by 1.3 by 5.0 cm (volume = 8.5 cm^3). Please note that as a  courtesy on today's exam, the technologist extended images down into the pelvis in order to encompass the entire region of concern. If imaging follow up becomes indicated, I would recommend requesting CT of the abdomen AND pelvis if imaging of the entire process is indicated; normally CT abdomen without a CT pelvis request would not extend below the iliac crests, in the interest of radiation reduction. Musculoskeletal: No additional findings IMPRESSION: 1. Successful drainage of the right psoas abscess. Likewise the subcutaneous component of abscess is markedly improved. 2. There continues to be some mild multilobular fluid density tracking  along the right posterior renal fascia and along the margin of the right quadratus lumborum muscle. These may represent a small loculated abscess component. This measures about 2.5 by 1.3 by 5.0 cm (volume = 8.5 cc). No internal gas density. 3. Airway thickening in both lower lobes with centrilobular emphysema. 4. Coronary and descending thoracic aortic atherosclerotic vascular calcifications. 5. 6 mm fat density lesion of the medial limb of the left adrenal gland superiorly favoring a small myelolipoma. This is a benign lesion and warrants no further imaging workup. Aortic Atherosclerosis (ICD10-I70.0) and Emphysema (ICD10-J43.9). Electronically Signed   By: Gaylyn Rong M.D.   On: 10/18/2023 17:41   DG CHEST PORT 1 VIEW Result Date: 10/17/2023 CLINICAL DATA:  Dyspnea. EXAM: PORTABLE CHEST 1 VIEW COMPARISON:  06/12/2022 FINDINGS: Patient is rotated to the right. Heart size within normal limits. 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 The lungs are otherwise clear. IMPRESSION: Emphysema.  No acute findings. Electronically Signed   By: Danae Orleans M.D.   On: 10/17/2023 13:26   CT GUIDED PERITONEAL/RETROPERITONEAL FLUID DRAIN BY PERC CATH Result Date: 10/15/2023 INDICATION: Right abdominal/pelvic abscess. Right flank abscess. Please perform image guided drainage catheter placement for infection source control purposes. EXAM: CT PERC DRAIN PERITONEAL ABCESS COMPARISON:  CT abdomen pelvis-10/04/2022; 05/02/2022; 10/09/2021 Abdominal MRI-10/10/2023 MEDICATIONS: The patient is currently admitted to the hospital and receiving intravenous antibiotics. The antibiotics were administered within an appropriate time frame prior to the initiation of the procedure. ANESTHESIA/SEDATION: Moderate (conscious) sedation was employed during this procedure. A total of Versed 1 mg and Fentanyl 25 mcg was administered intravenously. Moderate Sedation Time: 12 minutes. The patient's level of consciousness and vital signs were monitored  continuously by radiology nursing throughout the procedure under my direct supervision. CONTRAST:  None COMPLICATIONS: None immediate. PROCEDURE: RADIATION DOSE REDUCTION: This exam was performed according to the departmental dose-optimization program which includes automated exposure control, adjustment of the mA and/or kV according to patient size and/or use of iterative reconstruction technique. Informed written consent was obtained from the patient after a discussion of the risks, benefits and alternatives to treatment. The patient was placed supine, slightly LPO on the CT gantry and a pre procedural CT was performed re-demonstrating the known abscess/fluid collection within the right lower abdomen with dominant component measuring at least 4.7 x 3.0 cm (image 48, series 2). 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 The procedure was planned. A timeout was performed prior to the initiation of the procedure. The skin overlying the inferolateral aspect of the right lower abdomen/pelvis was prepped and draped in the usual sterile fashion. The overlying soft tissues were anesthetized with 1% lidocaine with epinephrine. Appropriate trajectory was planned with the use of a 22 gauge spinal needle. An 18 gauge trocar needle was advanced into the abscess/fluid collection and a short Amplatz super stiff wire  was coiled within the collection. Appropriate positioning was confirmed with a limited CT scan. The tract was serially dilated allowing placement of a 10 [place] all-purpose drainage catheter. Appropriate positioning was confirmed with a limited postprocedural CT scan. Approximately 30 ml of purulent fluid was aspirated. The tube was connected to a drainage bag and sutured in place. A dressing was applied. The patient tolerated the procedure well without immediate post procedural complication.  IMPRESSION: Successful CT guided placement of a 10 French all purpose drain catheter into the right lower abdominal/pelvic abscess with aspiration of 30 mL of purulent fluid. Samples were sent to the laboratory as requested by the ordering clinical team. Review of preprocedural noncontrast CT scan of the abdomen pelvis performed 10/05/2023 demonstrates a punctate (4 mm) opacity within the right lower abdomen/pelvis (image 50, series 2) which is nonspecific though favored to represent either a dropped gallstone versus an appendicolith. Note, patient denies history of previous appendectomy however the appendix is not visualized and a surgical clip is noted adjacent to the cecum. Concern that this complex right abdominal/pelvic and body wall abscess is attributable to either a dropped gallstone or appendicolith was discussed with referring surgeon, Dr. Lovell Sheehan, at the time of procedure completion. Electronically Signed   By: Simonne Come M.D.   On: 10/15/2023 14:42   MR ABDOMEN WWO CONTRAST Result Date: 10/10/2023 CLINICAL DATA:  73 year old female with history of palpable abdominal mass in the right back and low flank. EXAM: MRI ABDOMEN WITHOUT AND WITH CONTRAST TECHNIQUE: Multiplanar multisequence MR imaging of the abdomen was performed both before and after the administration of intravenous contrast. CONTRAST:  6.25mL GADAVIST GADOBUTROL 1 MMOL/ML IV SOLN COMPARISON:  Abdominal MRI 10/14/2021. CT of the abdomen and pelvis 10/05/2023. FINDINGS: Lower chest: Cardiomegaly. Hepatobiliary: Well-circumscribed T1 hypointense, T2 hyperintense nonenhancing lesions are noted in the liver measuring up to 1 cm, compatible with tiny cysts and/or biliary hamartomas (no imaging follow-up recommended). No other aggressive appearing hepatic lesions are noted. No intra or extrahepatic biliary ductal dilatation. Status post cholecystectomy. Pancreas: No pancreatic mass. No pancreatic ductal dilatation. No pancreatic or peripancreatic  fluid collections or inflammatory changes. Spleen:  Unremarkable. Adrenals/Urinary Tract: Numerous T1 hypointense, T2 hyperintense, nonenhancing lesions in both kidneys compatible with simple (Bosniak class 1) cysts which require no imaging follow up, measuring up to 4.2 cm extending exophytically from the upper pole of the left kidney. No aggressive appearing renal lesions are otherwise noted. No hydroureteronephrosis in the visualized portions of the abdomen. Bilateral adrenal glands are normal in appearance. Stomach/Bowel: Visualized portions of the stomach, small bowel and colon are grossly unremarkable in appearance. Vascular/Lymphatic: Aortic atherosclerosis without definite aneurysm in the abdominal vasculature. No definite lymphadenopathy confidently identified in the abdomen. Other: No significant volume of ascites noted in the visualized portions of the peritoneal cavity. Musculoskeletal: Along the right iliopsoas musculature and the right pelvic sidewall there are complex centrally T1 hypointense and T2 hyperintense peripherally rim enhancing fluid collections with multiple internal septations, suspicious for abscesses. The largest of these is centered in the lateral aspect of the right psoas muscle (axial image 76 of series 28 and coronal image 73 of series 25) estimated to measure proximally 5.3 x 3.1 x 4.9 cm. These appear to communicate into the right flank musculature and into the overlying subcutaneous fat of the right flank via fistulous tracts. The largest collection in the subcutaneous fat of the right flank is estimated to measure at least 10.1 x 4.5 x 7.4 cm (axial image 60  of series 28 and coronal image 105 of series 25). No aggressive appearing osseous lesions are noted in the visualized portions of the skeleton. IMPRESSION: 1. Multiple complex fluid collections in the right iliopsoas musculature and right pelvic sidewall, with fistulous tracts extending into the right flank musculature and  subcutaneous fat of the right flank, as above, concerning for abscesses. 2. Cardiomegaly. 3. Aortic atherosclerosis. Electronically Signed   By: Trudie Reed M.D.   On: 10/10/2023 12:15   CT ABDOMEN PELVIS WO CONTRAST Result Date: 10/05/2023 CLINICAL DATA:  Palpable mass right low back/flank EXAM: CT ABDOMEN AND PELVIS WITHOUT CONTRAST TECHNIQUE: Multidetector CT imaging of the abdomen and pelvis was performed following the standard protocol without IV contrast. RADIATION DOSE REDUCTION: This exam was performed according to the departmental dose-optimization program which includes automated exposure control, adjustment of the mA and/or kV according to patient size and/or use of iterative reconstruction technique. COMPARISON:  CT 05/02/2022 FINDINGS: Lower chest: Lung bases demonstrate emphysema. No acute airspace disease. Coronary vascular calcification. Asymmetric row right subareolar breast density on series 2, image 4. Hepatobiliary: Stable hypodensity in the left hepatic lobe, previously characterized by MRI. Cholecystectomy. No biliary dilatation Pancreas: Unremarkable. No pancreatic ductal dilatation or surrounding inflammatory changes. Spleen: Normal in size without focal abnormality. Adrenals/Urinary Tract: Adrenal glands are normal. Kidneys show no hydronephrosis. Left renal cysts for which no imaging follow-up is recommended. The bladder is normal Stomach/Bowel: The stomach is nonenlarged. There is no dilated small bowel. No acute bowel wall thickening Vascular/Lymphatic: Advanced aortic atherosclerosis. No aneurysm. No suspicious lymph nodes. Reproductive: Hysterectomy.  No adnexal mass Other: Negative for pelvic effusion or free air. Small indeterminate soft tissue nodule measuring 14 mm on series 2, image 37, adjacent to the ascending colon. Musculoskeletal: No acute fracture is seen. Right flank heterogenous collection containing fluid and some fat density. This measures roughly 4.6 cm AP by  11.4 cm oblique craniocaudal by 10.7 cm transverse, and extends to the skin surface where there is overlying skin thickening. This is contiguous with irregular density extending into the right retroperitoneum and contiguous with irregular collection abutting the right psoas and iliacus muscles. Collection or mass abutting the right psoas also has some internal fat density. IMPRESSION: 1. Right flank heterogenous collection containing fluid and some fat density measuring up to 11.4 cm, extends to the skin surface and is contiguous with irregular density extending into the right retroperitoneum with additional irregular collection or mass abutting the right psoas and iliacus muscles. Differential considerations include organizing hematoma from prior trauma, possibly with component of fat necrosis. Underlying soft tissue neoplasm complicated by hemorrhage is also a consideration as patient gives no history of trauma to the area. Further evaluation with MRI with and without contrast is suggested for further assessment. 2. Small indeterminate soft tissue nodule adjacent to the ascending colon measuring 14 mm. The irregular appearance is somewhat suspicious for a metastatic focus. 3. Asymmetric right subareolar breast density, recommend correlation with outpatient mammography and possible ultrasound. 4. Aortic atherosclerosis. Aortic Atherosclerosis (ICD10-I70.0) and Emphysema (ICD10-J43.9). Electronically Signed   By: Jasmine Pang M.D.   On: 10/05/2023 16:38     Discharge Exam: Vitals:   10/19/23 0840 10/19/23 0906  BP: 116/65 116/65  Pulse: (!) 103 (!) 103  Resp: 20   Temp: 98.2 F (36.8 C)   SpO2: 100%    Vitals:   10/18/23 2313 10/19/23 0655 10/19/23 0840 10/19/23 0906  BP: 91/77  116/65 116/65  Pulse:   (!) 103 Marland Kitchen)  103  Resp: 18  20   Temp: 98.7 F (37.1 C)  98.2 F (36.8 C)   TempSrc:   Oral   SpO2: 100% 100% 100%   Weight:      Height:        General: Pt is alert, awake, not in acute  distress Cardiovascular: RRR, S1/S2 +, no rubs, no gallops Respiratory: CTA bilaterally, no wheezing, no rhonchi Abdominal: Soft, NT, ND, bowel sounds + Extremities: no edema, no cyanosis    The results of significant diagnostics from this hospitalization (including imaging, microbiology, ancillary and laboratory) are listed below for reference.     Microbiology: Recent Results (from the past 240 hours)  Blood culture (routine x 2)     Status: None   Collection Time: 10/13/23  4:54 PM   Specimen: BLOOD LEFT WRIST  Result Value Ref Range Status   Specimen Description BLOOD LEFT WRIST  Final   Special Requests   Final    BOTTLES DRAWN AEROBIC AND ANAEROBIC Blood Culture adequate volume   Culture   Final    NO GROWTH 5 DAYS Performed at [facility], [address] [place], [place] [address]    Report Status 10/18/2023 FINAL  Final  Blood culture (routine x 2)     Status: None   Collection Time: 10/13/23  4:54 PM   Specimen: BLOOD LEFT WRIST  Result Value Ref Range Status   Specimen Description BLOOD LEFT WRIST  Final   Special Requests   Final    BOTTLES DRAWN AEROBIC AND ANAEROBIC Blood Culture adequate volume   Culture   Final    NO GROWTH 5 DAYS Performed at [facility], [address], [place], [place] [address]    Report Status 10/18/2023 FINAL  Final  Aerobic/Anaerobic Culture w Gram Stain (surgical/deep wound)     Status: None (Preliminary result)   Collection Time: 10/15/23  2:13 PM   Specimen: Abscess  Result Value Ref Range Status   Specimen Description ABSCESS  Final   Special Requests POST GUIDED DRAIN  Final   Gram Stain   Final    MODERATE WBC PRESENT, PREDOMINANTLY PMN NO ORGANISMS SEEN    Culture   Final    NO GROWTH 4 DAYS NO ANAEROBES ISOLATED; CULTURE IN PROGRESS FOR 5 DAYS Performed at [facility], [address], [place], [place] [address]    Report Status PENDING  Incomplete     Labs: BNP (last 3 results) No results for input(s):  "BNP" in the last 8760 hours. Basic Metabolic Panel: Recent Labs  Lab 10/15/23 0401 10/16/23 0450 10/17/23 0427 10/18/23 0420 10/19/23 0501  NA 142 143 144 141 142  K 3.4* 3.9 3.2* 3.0* 3.6  CL 108 111 104 99 101  CO2 21* 25 28 30  32  GLUCOSE 165* 154* 154* 155* 149*  BUN 9 5* <5* <5* 5*  CREATININE 0.74 0.65 0.81 1.00 1.17*  CALCIUM 7.5* 8.1* 8.2* 8.0* 7.9*  MG 1.1* 1.8 1.4* 1.9 1.4*   Liver Function Tests: No results for input(s): "AST", "ALT", "ALKPHOS", "BILITOT", "PROT", "ALBUMIN" in the last 168 hours. No results for input(s): "LIPASE", "AMYLASE" in the last 168 hours. No results for input(s): "AMMONIA" in the last 168 hours. CBC: Recent Labs  Lab 10/15/23 0401 10/15/23 1635 10/16/23 0450 10/16/23 1004 10/17/23 0427 10/18/23 0420 10/19/23 0501  WBC 12.3*  --  12.4*  --  11.9* 12.5* 13.6*  HGB 9.3*   < > 8.1* 8.4* 8.5* 9.1* 9.1*  HCT 32.0*   < > 28.3* 28.2* 28.2* 30.6* 30.7*  MCV 107.7*  --  106.8*  --  106.0* 105.5* 105.9*  PLT 521*  --  439*  --  454* 464* 462*   < > = values in this interval not displayed.   Cardiac Enzymes: No results for input(s): "CKTOTAL", "CKMB", "CKMBINDEX", "TROPONINI" in the last 168 hours. BNP: Invalid input(s): "POCBNP" CBG: No results for input(s): "GLUCAP" in the last 168 hours. D-Dimer No results for input(s): "DDIMER" in the last 72 hours. Hgb A1c No results for input(s): "HGBA1C" in the last 72 hours. Lipid Profile No results for input(s): "CHOL", "HDL", "LDLCALC", "TRIG", "CHOLHDL", "LDLDIRECT" in the last 72 hours. Thyroid function studies No results for input(s): "TSH", "T4TOTAL", "T3FREE", "THYROIDAB" in the last 72 hours.  Invalid input(s): "FREET3" Anemia work up No results for input(s): "VITAMINB12", "FOLATE", "FERRITIN", "TIBC", "IRON", "RETICCTPCT" in the last 72 hours. Urinalysis    Component Value Date/Time   COLORURINE YELLOW 06/11/2022 0624   APPEARANCEUR CLEAR 06/11/2022 0624   LABSPEC 1.015  06/11/2022 0624   PHURINE 6.0 06/11/2022 0624   GLUCOSEU NEGATIVE 06/11/2022 0624   HGBUR NEGATIVE 06/11/2022 0624   BILIRUBINUR NEGATIVE 06/11/2022 0624   KETONESUR NEGATIVE 06/11/2022 0624   PROTEINUR 30 (A) 06/11/2022 0624   UROBILINOGEN 1.0 07/16/2014 0244   NITRITE NEGATIVE 06/11/2022 0624   LEUKOCYTESUR NEGATIVE 06/11/2022 0624   Sepsis Labs Recent Labs  Lab 10/16/23 0450 10/17/23 0427 10/18/23 0420 10/19/23 0501  WBC 12.4* 11.9* 12.5* 13.6*   Microbiology Recent Results (from the past 240 hours)  Blood culture (routine x 2)     Status: None   Collection Time: 10/13/23  4:54 PM   Specimen: BLOOD LEFT WRIST  Result Value Ref Range Status   Specimen Description BLOOD LEFT WRIST  Final   Special Requests   Final    BOTTLES DRAWN AEROBIC AND ANAEROBIC Blood Culture adequate volume   Culture   Final    NO GROWTH 5 DAYS Performed at [facility], [address], [place], [place] [address]    Report Status 10/18/2023 FINAL  Final  Blood culture (routine x 2)     Status: None   Collection Time: 10/13/23  4:54 PM   Specimen: BLOOD LEFT WRIST  Result Value Ref Range Status   Specimen Description BLOOD LEFT WRIST  Final   Special Requests   Final    BOTTLES DRAWN AEROBIC AND ANAEROBIC Blood Culture adequate volume   Culture   Final    NO GROWTH 5 DAYS Performed at [facility], [address], [place], [place] [address]    Report Status 10/18/2023 FINAL  Final  Aerobic/Anaerobic Culture w Gram Stain (surgical/deep wound)     Status: None (Preliminary result)   Collection Time: 10/15/23  2:13 PM   Specimen: Abscess  Result Value Ref Range Status   Specimen Description ABSCESS  Final   Special Requests POST GUIDED DRAIN  Final   Gram Stain   Final    MODERATE WBC PRESENT, PREDOMINANTLY PMN NO ORGANISMS SEEN    Culture   Final    NO GROWTH 4 DAYS NO ANAEROBES ISOLATED; CULTURE IN PROGRESS FOR 5 DAYS Performed at [facility], [address], [place], [place]  [address]    Report Status PENDING  Incomplete     Time coordinating discharge: 35 minutes  SIGNED:   Erick Blinks, DO [facility] 10/19/2023, 10:04 AM  If 7PM-7AM, please contact night-coverage [email or web address]

## 2023-10-19 NOTE — Progress Notes (Signed)
Subjective: Patient has no complaints this morning.  Events of last night noted.  Objective: Vital signs in last 24 hours: Temp:  [97.7 F (36.5 C)-98.7 F (37.1 C)] 98.7 F (37.1 C) (01/27 2313) Pulse Rate:  [100-122] 100 (01/27 2116) Resp:  [18-20] 18 (01/27 2313) BP: (91-141)/(57-77) 91/77 (01/27 2313) SpO2:  [100 %] 100 % (01/28 0655) Last BM Date : 10/18/23  Intake/Output from previous day: No intake/output data recorded. Intake/Output this shift: No intake/output data recorded.  General appearance: alert, cooperative, and no distress Skin: Minimal serosanguineous drainage noted in bag.  No purulent drainage noted.  Lab Results:  Recent Labs    10/18/23 0420 10/19/23 0501  WBC 12.5* 13.6*  HGB 9.1* 9.1*  HCT 30.6* 30.7*  PLT 464* 462*   BMET Recent Labs    10/18/23 0420 10/19/23 0501  NA 141 142  K 3.0* 3.6  CL 99 101  CO2 30 32  GLUCOSE 155* 149*  BUN <5* 5*  CREATININE 1.00 1.17*  CALCIUM 8.0* 7.9*   PT/INR No results for input(s): "LABPROT", "INR" in the last 72 hours.  Studies/Results: CT ABDOMEN WO CONTRAST Result Date: 10/18/2023 CLINICAL DATA:  Psoas abscess, prior drainage. EXAM: CT ABDOMEN WITHOUT CONTRAST TECHNIQUE: Multidetector CT imaging of the abdomen was performed following the standard protocol without IV contrast. RADIATION DOSE REDUCTION: This exam was performed according to the departmental dose-optimization program which includes automated exposure control, adjustment of the mA and/or kV according to patient size and/or use of iterative reconstruction technique. COMPARISON:  Multiple exams, including procedural CT images from the drainage procedure on 10/15/2023 FINDINGS: Lower chest: Airway thickening in both lower lobes with centrilobular emphysema. Coronary and descending thoracic aortic atherosclerotic vascular calcifications. Hepatobiliary: Suspected hepatic cyst in the left hepatic lobe on image 8 series 2. This is similar to the  10/10/2023 MRI. Cholecystectomy. Pancreas: Unremarkable Spleen: Unremarkable Adrenals/Urinary Tract: Multiple renal cysts as depicted on the 10/10/2023 exam as benign. No further imaging workup of these lesions is indicated. 6 mm fat density lesion of the medial limb of the left adrenal gland superiorly favoring a small myelolipoma. This is a benign lesion and warrants no further imaging workup. Stomach/Bowel: Unremarkable Vascular/Lymphatic: Atherosclerosis is present, including aortoiliac atherosclerotic disease. Atheromatous plaque proximally in the celiac trunk and superior mesenteric artery. Other: The right drainage catheter is noted with loop formed along the lateral margin of the right psoas just below the level of the iliac crests on image 45 series 2. This is at the site of prior retroperitoneal abscess. The previous retroperitoneal abscess has been drained and is substantially improved. The fistulous track is only faintly seen in the subcutaneous abscess component shown on MRI of 10/10/2023 is likewise markedly reduced with only a band of subcutaneous edema and overlying cutaneous thickening at the site of previous abscess. There continues to be some mild multilobular fluid density tracking along the right posterior renal fascia and along the margin of the right quadratus lumborum muscle for example images 22-34 of series 2. These may represent a small loculated abscess component. This measures about 2.5 by 1.3 by 5.0 cm (volume = 8.5 cm^3). Please note that as a courtesy on today's exam, the technologist extended images down into the pelvis in order to encompass the entire region of concern. If imaging follow up becomes indicated, I would recommend requesting CT of the abdomen AND pelvis if imaging of the entire process is indicated; normally CT abdomen without a CT pelvis request would not extend below  the iliac crests, in the interest of radiation reduction. Musculoskeletal: No additional findings  IMPRESSION: 1. Successful drainage of the right psoas abscess. Likewise the subcutaneous component of abscess is markedly improved. 2. There continues to be some mild multilobular fluid density tracking along the right posterior renal fascia and along the margin of the right quadratus lumborum muscle. These may represent a small loculated abscess component. This measures about 2.5 by 1.3 by 5.0 cm (volume = 8.5 cc). No internal gas density. 3. Airway thickening in both lower lobes with centrilobular emphysema. 4. Coronary and descending thoracic aortic atherosclerotic vascular calcifications. 5. 6 mm fat density lesion of the medial limb of the left adrenal gland superiorly favoring a small myelolipoma. This is a benign lesion and warrants no further imaging workup. Aortic Atherosclerosis (ICD10-I70.0) and Emphysema (ICD10-J43.9). Electronically Signed   By: Gaylyn Rong M.D.   On: 10/18/2023 17:41   DG CHEST PORT 1 VIEW Result Date: 10/17/2023 CLINICAL DATA:  Dyspnea. EXAM: PORTABLE CHEST 1 VIEW COMPARISON:  06/12/2022 FINDINGS: Patient is rotated to the right. Heart size within normal limits. 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 The lungs are otherwise clear. IMPRESSION: Emphysema.  No acute findings. Electronically Signed   By: Danae Orleans M.D.   On: 10/17/2023 13:26    Anti-infectives: Anti-infectives (From admission, onward)    Start     Dose/Rate Route Frequency Ordered Stop   10/15/23 1800  vancomycin (VANCOCIN) IVPB 1000 mg/200 mL premix  Status:  Discontinued        1,000 mg 200 mL/hr over 60 Minutes Intravenous Every 24 hours 10/15/23 1200 10/18/23 1037   10/14/23 2000  cefTRIAXone (ROCEPHIN) 2 g in sodium chloride 0.9 % 100 mL IVPB        2 g 200 mL/hr over 30 Minutes Intravenous Every 24 hours 10/14/23 1233     10/14/23 1800  vancomycin (VANCOREADY) IVPB 750 mg/150 mL  Status:  Discontinued        750 mg 150 mL/hr over 60 Minutes Intravenous Every 24 hours 10/13/23 1618 10/15/23 1200    10/14/23 0400  aztreonam (AZACTAM) 2 g in sodium chloride 0.9 % 100 mL IVPB  Status:  Discontinued        2 g 200 mL/hr over 30 Minutes Intravenous Every 8 hours 10/13/23 1653 10/14/23 1233   10/13/23 1800  metroNIDAZOLE (FLAGYL) IVPB 500 mg        500 mg 100 mL/hr over 60 Minutes Intravenous Every 12 hours 10/13/23 1622     10/13/23 1630  vancomycin (VANCOREADY) IVPB 1500 mg/300 mL        1,500 mg 150 mL/hr over 120 Minutes Intravenous  Once 10/13/23 1617 10/13/23 2011   10/13/23 1630  aztreonam (AZACTAM) 2 g in sodium chloride 0.9 % 100 mL IVPB        2 g 200 mL/hr over 30 Minutes Intravenous  Once 10/13/23 1620 10/13/23 1740       Assessment/Plan: Impression: Right psoas abscess, resolved.  CT scan reassuring.  Cultures negative for growth for 3 days.  Thus, drain removed.  Would continue antibiotics for a total of 1 week.  LOS: 6 days    Franky Macho 10/19/2023

## 2023-10-19 NOTE — Consult Note (Signed)
[facility] Liaison Note  10/19/2023  [name] 08-22-1951 [id number]  Location: RN Hospital Liaison screened the patient remotely at [facility].  Insurance: [employer]   [name] is a 73 y.o. female who is a Primary Care Patient of [name], Wayland Salinas, MD. The patient was screened for  readmission hospitalization with noted low risk score for unplanned readmission risk with 1 IP/ 1ED in 6 months.  The patient was assessed for potential Care Management service needs for post hospital transition for care coordination. Review of patient's electronic medical record reveals patient was admitted with Iliopsoas. Pt discharged with no anticipated needs at this time.  Plan: [facility] Liaison will continue to follow progress and disposition to asess for post hospital community care coordination/management needs.  Referral request for community care coordination: anticipate Transitions of Care Team follow up.   VBCI Care Management/Population Health does not replace or interfere with any arrangements made by the Inpatient Transition of Care team.   For questions contact:   Elliot Cousin, RN, [facility] Liaison [place]   [facility], Population Health Office Hours MTWF  8:00 am-6:00 pm Direct Dial: [phone] mobile [phone] [Office toll free line] Office Hours are M-F 8:30 - 5 pm [email or web address]

## 2023-10-19 NOTE — Care Management Important Message (Signed)
Important Message  Patient Details  Name: MALESSA ZARTMAN MRN: [id number] Date of Birth: 12-31-1950   Important Message Given:  Yes - Medicare IM     Corey Harold 10/19/2023, 10:35 AM

## 2023-10-20 ENCOUNTER — Telehealth: Payer: Self-pay | Admitting: *Deleted

## 2023-10-20 LAB — AEROBIC/ANAEROBIC CULTURE W GRAM STAIN (SURGICAL/DEEP WOUND)

## 2023-10-20 NOTE — Transitions of Care (Post Inpatient/ED Visit) (Signed)
   10/20/2023  Name: [name] MRN: [id number] DOB: 14-Jul-1951  Today's TOC FU Call Status: Today's TOC FU Call Status:: Unsuccessful Call (1st Attempt) Unsuccessful Call (1st Attempt) Date: 10/20/23  Attempted to reach the patient regarding the most recent Inpatient/ED visit.  Follow Up Plan: Additional outreach attempts will be made to reach the patient to complete the Transitions of Care (Post Inpatient/ED visit) call.   Gean Maidens BSN RN [facility] Health Care Management Coordinator [email or web address] Direct Dial: [phone]  Fax: [phone] Website: [email or web address]

## 2023-10-21 ENCOUNTER — Ambulatory Visit (HOSPITAL_COMMUNITY): Admission: RE | Admit: 2023-10-21 | Payer: Medicare Other | Source: Ambulatory Visit

## 2023-10-21 ENCOUNTER — Telehealth: Payer: Self-pay

## 2023-10-21 NOTE — Transitions of Care (Post Inpatient/ED Visit) (Signed)
   10/21/2023  Name: [name] MRN: [id number] DOB: [birth year]  Today's TOC FU Call Status: Today's TOC FU Call Status:: Unsuccessful Call (2nd Attempt) Unsuccessful Call (2nd Attempt) Date: 10/21/23  Attempted to reach the patient regarding the most recent Inpatient/ED visit.  Follow Up Plan: Additional outreach attempts will be made to reach the patient to complete the Transitions of Care (Post Inpatient/ED visit) call.   Lonia Chimera, RN, BSN, CEN [employer]- Transition of Care Team.  [facility] [phone]

## 2023-10-22 ENCOUNTER — Telehealth: Payer: Self-pay

## 2023-10-22 NOTE — Transitions of Care (Post Inpatient/ED Visit) (Signed)
   10/22/2023  Name: FELESHA MONCRIEFFE MRN: [id number] DOB: 23-Dec-1950  Today's TOC FU Call Status: Today's TOC FU Call Status:: Unsuccessful Call (3rd Attempt) Unsuccessful Call (3rd Attempt) Date: 10/22/23  Attempted to reach the patient regarding the most recent Inpatient/ED visit. Patient was called in an Outreach attempt to offer VBCI  30-day TOC program.Pt is eligible for program due to potential risk for readmission and/or high utilization. Unfortunately, I was not able to speak with the patient in regards to recent hospital discharge   Follow Up Plan: No further outreach attempts will be made at this time. We have been unable to contact the patient.  Jodelle Gross RN, BSN, CCM [place]  [facility] Manager Population Health Direct Dial: [phone]  Fax: [phone]

## 2023-10-28 DIAGNOSIS — A419 Sepsis, unspecified organism: Secondary | ICD-10-CM | POA: Diagnosis not present

## 2023-10-28 DIAGNOSIS — J9610 Chronic respiratory failure, unspecified whether with hypoxia or hypercapnia: Secondary | ICD-10-CM | POA: Diagnosis not present

## 2023-10-28 DIAGNOSIS — K625 Hemorrhage of anus and rectum: Secondary | ICD-10-CM | POA: Diagnosis not present

## 2023-10-28 DIAGNOSIS — I5022 Chronic systolic (congestive) heart failure: Secondary | ICD-10-CM | POA: Diagnosis not present

## 2023-10-28 DIAGNOSIS — K6812 Psoas muscle abscess: Secondary | ICD-10-CM | POA: Diagnosis not present

## 2023-11-01 ENCOUNTER — Encounter: Payer: Self-pay | Admitting: Emergency Medicine

## 2023-11-02 ENCOUNTER — Encounter: Payer: Self-pay | Admitting: Internal Medicine

## 2023-11-23 ENCOUNTER — Emergency Department (HOSPITAL_COMMUNITY)

## 2023-11-23 ENCOUNTER — Encounter (HOSPITAL_COMMUNITY): Payer: Self-pay | Admitting: *Deleted

## 2023-11-23 ENCOUNTER — Observation Stay (HOSPITAL_COMMUNITY)
Admission: EM | Admit: 2023-11-23 | Discharge: 2023-11-24 | Disposition: A | Discharge status: Home / Self Care | Attending: Internal Medicine | Admitting: Internal Medicine

## 2023-11-23 ENCOUNTER — Other Ambulatory Visit: Payer: Self-pay

## 2023-11-23 DIAGNOSIS — Z8673 Personal history of transient ischemic attack (TIA), and cerebral infarction without residual deficits: Secondary | ICD-10-CM | POA: Insufficient documentation

## 2023-11-23 DIAGNOSIS — R778 Other specified abnormalities of plasma proteins: Secondary | ICD-10-CM | POA: Insufficient documentation

## 2023-11-23 DIAGNOSIS — J9611 Chronic respiratory failure with hypoxia: Secondary | ICD-10-CM | POA: Diagnosis not present

## 2023-11-23 DIAGNOSIS — I2510 Atherosclerotic heart disease of native coronary artery without angina pectoris: Secondary | ICD-10-CM | POA: Diagnosis present

## 2023-11-23 DIAGNOSIS — R112 Nausea with vomiting, unspecified: Secondary | ICD-10-CM | POA: Diagnosis not present

## 2023-11-23 DIAGNOSIS — E876 Hypokalemia: Secondary | ICD-10-CM | POA: Diagnosis not present

## 2023-11-23 DIAGNOSIS — I5022 Chronic systolic (congestive) heart failure: Secondary | ICD-10-CM | POA: Diagnosis present

## 2023-11-23 DIAGNOSIS — J449 Chronic obstructive pulmonary disease, unspecified: Secondary | ICD-10-CM | POA: Diagnosis not present

## 2023-11-23 DIAGNOSIS — Z7982 Long term (current) use of aspirin: Secondary | ICD-10-CM | POA: Insufficient documentation

## 2023-11-23 DIAGNOSIS — Z87891 Personal history of nicotine dependence: Secondary | ICD-10-CM | POA: Insufficient documentation

## 2023-11-23 DIAGNOSIS — Z79899 Other long term (current) drug therapy: Secondary | ICD-10-CM | POA: Insufficient documentation

## 2023-11-23 DIAGNOSIS — N179 Acute kidney failure, unspecified: Secondary | ICD-10-CM | POA: Diagnosis present

## 2023-11-23 DIAGNOSIS — K6812 Psoas muscle abscess: Secondary | ICD-10-CM | POA: Diagnosis not present

## 2023-11-23 DIAGNOSIS — I700 Atherosclerosis of aorta: Secondary | ICD-10-CM | POA: Diagnosis not present

## 2023-11-23 DIAGNOSIS — R0602 Shortness of breath: Secondary | ICD-10-CM

## 2023-11-23 DIAGNOSIS — R739 Hyperglycemia, unspecified: Secondary | ICD-10-CM | POA: Insufficient documentation

## 2023-11-23 DIAGNOSIS — J439 Emphysema, unspecified: Secondary | ICD-10-CM | POA: Diagnosis not present

## 2023-11-23 DIAGNOSIS — Z1152 Encounter for screening for COVID-19: Secondary | ICD-10-CM | POA: Diagnosis not present

## 2023-11-23 DIAGNOSIS — R7302 Impaired glucose tolerance (oral): Secondary | ICD-10-CM | POA: Diagnosis present

## 2023-11-23 DIAGNOSIS — D72829 Elevated white blood cell count, unspecified: Secondary | ICD-10-CM | POA: Diagnosis not present

## 2023-11-23 DIAGNOSIS — N281 Cyst of kidney, acquired: Secondary | ICD-10-CM | POA: Diagnosis not present

## 2023-11-23 DIAGNOSIS — I10 Essential (primary) hypertension: Secondary | ICD-10-CM | POA: Diagnosis present

## 2023-11-23 DIAGNOSIS — Z7902 Long term (current) use of antithrombotics/antiplatelets: Secondary | ICD-10-CM | POA: Insufficient documentation

## 2023-11-23 DIAGNOSIS — R1084 Generalized abdominal pain: Secondary | ICD-10-CM | POA: Diagnosis not present

## 2023-11-23 DIAGNOSIS — R7989 Other specified abnormal findings of blood chemistry: Secondary | ICD-10-CM | POA: Diagnosis present

## 2023-11-23 DIAGNOSIS — R1110 Vomiting, unspecified: Secondary | ICD-10-CM | POA: Diagnosis not present

## 2023-11-23 DIAGNOSIS — I110 Hypertensive heart disease with heart failure: Secondary | ICD-10-CM | POA: Diagnosis not present

## 2023-11-23 LAB — COMPREHENSIVE METABOLIC PANEL
ALT: 13 U/L (ref 0–44)
AST: 38 U/L (ref 15–41)
Albumin: 3 g/dL — ABNORMAL LOW (ref 3.5–5.0)
Alkaline Phosphatase: 278 U/L — ABNORMAL HIGH (ref 38–126)
Anion gap: 13 (ref 5–15)
BUN: 8 mg/dL (ref 8–23)
CO2: 23 mmol/L (ref 22–32)
Calcium: 7.6 mg/dL — ABNORMAL LOW (ref 8.9–10.3)
Chloride: 104 mmol/L (ref 98–111)
Creatinine, Ser: 1.25 mg/dL — ABNORMAL HIGH (ref 0.44–1.00)
GFR, Estimated: 45 mL/min — ABNORMAL LOW (ref >60)
Glucose, Bld: 288 mg/dL — ABNORMAL HIGH (ref 70–99)
Potassium: 3.4 mmol/L — ABNORMAL LOW (ref 3.5–5.1)
Sodium: 140 mmol/L (ref 135–145)
Total Bilirubin: 0.7 mg/dL (ref 0.0–1.2)
Total Protein: 8.4 g/dL — ABNORMAL HIGH (ref 6.5–8.1)

## 2023-11-23 LAB — CBC WITH DIFFERENTIAL/PLATELET
Abs Immature Granulocytes: 0.08 10*3/uL — ABNORMAL HIGH (ref 0.00–0.07)
Basophils Absolute: 0.1 10*3/uL (ref 0.0–0.1)
Basophils Relative: 1 %
Eosinophils Absolute: 0 10*3/uL (ref 0.0–0.5)
Eosinophils Relative: 0 %
HCT: 38.2 % (ref 36.0–46.0)
Hemoglobin: 11.8 g/dL — ABNORMAL LOW (ref 12.0–15.0)
Immature Granulocytes: 0 %
Lymphocytes Relative: 27 %
Lymphs Abs: 4.9 10*3/uL — ABNORMAL HIGH (ref 0.7–4.0)
MCH: 32.4 pg (ref 26.0–34.0)
MCHC: 30.9 g/dL (ref 30.0–36.0)
MCV: 104.9 fL — ABNORMAL HIGH (ref 80.0–100.0)
Monocytes Absolute: 1.2 10*3/uL — ABNORMAL HIGH (ref 0.1–1.0)
Monocytes Relative: 7 %
Neutro Abs: 11.5 10*3/uL — ABNORMAL HIGH (ref 1.7–7.7)
Neutrophils Relative %: 65 %
Platelets: 534 10*3/uL — ABNORMAL HIGH (ref 150–400)
RBC: 3.64 MIL/uL — ABNORMAL LOW (ref 3.87–5.11)
RDW: 14.1 % (ref 11.5–15.5)
WBC: 17.8 10*3/uL — ABNORMAL HIGH (ref 4.0–10.5)
nRBC: 0 % (ref 0.0–0.2)

## 2023-11-23 LAB — TSH: TSH: 0.671 u[IU]/mL (ref 0.350–4.500)

## 2023-11-23 LAB — URINALYSIS, W/ REFLEX TO CULTURE (INFECTION SUSPECTED)
Bilirubin Urine: NEGATIVE
Glucose, UA: NEGATIVE mg/dL
Hgb urine dipstick: NEGATIVE
Ketones, ur: NEGATIVE mg/dL
Leukocytes,Ua: NEGATIVE
Nitrite: NEGATIVE
Protein, ur: 30 mg/dL — AB
Specific Gravity, Urine: 1.024 (ref 1.005–1.030)
pH: 5 (ref 5.0–8.0)

## 2023-11-23 LAB — RESP PANEL BY RT-PCR (RSV, FLU A&B, COVID)  RVPGX2
Influenza A by PCR: NEGATIVE
Influenza B by PCR: NEGATIVE
Resp Syncytial Virus by PCR: NEGATIVE
SARS Coronavirus 2 by RT PCR: NEGATIVE

## 2023-11-23 LAB — LACTIC ACID, PLASMA
Lactic Acid, Venous: 3 mmol/L (ref 0.5–1.9)
Lactic Acid, Venous: 2.8 mmol/L (ref 0.5–1.9)

## 2023-11-23 LAB — TROPONIN I (HIGH SENSITIVITY)
Troponin I (High Sensitivity): 26 ng/L — ABNORMAL HIGH (ref <18)
Troponin I (High Sensitivity): 28 ng/L — ABNORMAL HIGH (ref <18)

## 2023-11-23 LAB — GLUCOSE, CAPILLARY
Glucose-Capillary: 107 mg/dL — ABNORMAL HIGH (ref 70–99)
Glucose-Capillary: 129 mg/dL — ABNORMAL HIGH (ref 70–99)

## 2023-11-23 LAB — BRAIN NATRIURETIC PEPTIDE: B Natriuretic Peptide: 101 pg/mL — ABNORMAL HIGH (ref 0.0–100.0)

## 2023-11-23 LAB — APTT: aPTT: 25 s (ref 24–36)

## 2023-11-23 LAB — PROTIME-INR
INR: 1 (ref 0.8–1.2)
Prothrombin Time: 13.6 s (ref 11.4–15.2)

## 2023-11-23 MED ORDER — ACETAMINOPHEN 325 MG PO TABS: 650.0000 mg | ORAL_TABLET | Freq: Four times a day (QID) | ORAL | Status: DC | PRN

## 2023-11-23 MED ORDER — INSULIN ASPART 100 UNIT/ML IJ SOLN
0.0000 [IU] | INTRAMUSCULAR | Status: DC
  Administered 2023-11-23: 1 [IU] via SUBCUTANEOUS
  Administered 2023-11-24: 2 [IU] via SUBCUTANEOUS
  Administered 2023-11-24: 1 [IU] via SUBCUTANEOUS

## 2023-11-23 MED ORDER — MORPHINE SULFATE (PF) 2 MG/ML IV SOLN
2.0000 mg | INTRAVENOUS | Status: DC | PRN
  Administered 2023-11-23 – 2023-11-24 (×3): 2 mg via INTRAVENOUS
  Filled 2023-11-23 (×3): qty 1

## 2023-11-23 MED ORDER — ONDANSETRON HCL 4 MG/2ML IJ SOLN: 4.0000 mg | Freq: Four times a day (QID) | INTRAMUSCULAR | Status: DC | PRN

## 2023-11-23 MED ORDER — CLOPIDOGREL BISULFATE 75 MG PO TABS
75.0000 mg | ORAL_TABLET | Freq: Every day | ORAL | Status: DC
  Administered 2023-11-23 – 2023-11-24 (×2): 75 mg via ORAL
  Filled 2023-11-23 (×2): qty 1

## 2023-11-23 MED ORDER — METOPROLOL TARTRATE 5 MG/5ML IV SOLN
5.0000 mg | Freq: Four times a day (QID) | INTRAVENOUS | Status: DC | PRN
  Administered 2023-11-24: 5 mg via INTRAVENOUS
  Filled 2023-11-23: qty 5

## 2023-11-23 MED ORDER — HEPARIN SODIUM (PORCINE) 5000 UNIT/ML IJ SOLN
5000.0000 [IU] | Freq: Three times a day (TID) | INTRAMUSCULAR | Status: DC
  Administered 2023-11-23 – 2023-11-24 (×3): 5000 [IU] via SUBCUTANEOUS
  Filled 2023-11-23 (×3): qty 1

## 2023-11-23 MED ORDER — METOCLOPRAMIDE HCL 5 MG/ML IJ SOLN
10.0000 mg | Freq: Once | INTRAMUSCULAR | Status: AC
  Administered 2023-11-23: 10 mg via INTRAVENOUS
  Filled 2023-11-23: qty 2

## 2023-11-23 MED ORDER — ACETAMINOPHEN 650 MG RE SUPP: 650.0000 mg | Freq: Four times a day (QID) | RECTAL | Status: DC | PRN

## 2023-11-23 MED ORDER — METOPROLOL TARTRATE 5 MG/5ML IV SOLN
5.0000 mg | Freq: Once | INTRAVENOUS | Status: AC
  Administered 2023-11-23: 5 mg via INTRAVENOUS
  Filled 2023-11-23: qty 5

## 2023-11-23 MED ORDER — ONDANSETRON HCL 4 MG/2ML IJ SOLN
4.0000 mg | Freq: Once | INTRAMUSCULAR | Status: AC
  Administered 2023-11-23: 4 mg via INTRAVENOUS
  Filled 2023-11-23: qty 2

## 2023-11-23 MED ORDER — SODIUM CHLORIDE 0.9 % IV BOLUS
500.0000 mL | Freq: Once | INTRAVENOUS | Status: AC
  Administered 2023-11-23: 500 mL via INTRAVENOUS

## 2023-11-23 MED ORDER — POTASSIUM CHLORIDE 10 MEQ/100ML IV SOLN
10.0000 meq | INTRAVENOUS | Status: AC
  Administered 2023-11-23 (×4): 10 meq via INTRAVENOUS
  Filled 2023-11-23 (×3): qty 100

## 2023-11-23 MED ORDER — METOPROLOL SUCCINATE ER 50 MG PO TB24
50.0000 mg | ORAL_TABLET | Freq: Every day | ORAL | Status: DC
  Administered 2023-11-23 – 2023-11-24 (×2): 50 mg via ORAL
  Filled 2023-11-23 (×2): qty 1

## 2023-11-23 MED ORDER — PNEUMOCOCCAL 20-VAL CONJ VACC 0.5 ML IM SUSY: 0.5000 mL | PREFILLED_SYRINGE | INTRAMUSCULAR | Status: DC

## 2023-11-23 MED ORDER — LACTATED RINGERS IV SOLN: INTRAVENOUS | Status: AC

## 2023-11-23 MED ORDER — LACTATED RINGERS IV SOLN: INTRAVENOUS | Status: DC

## 2023-11-23 MED ORDER — INFLUENZA VAC A&B SURF ANT ADJ 0.5 ML IM SUSY: 0.5000 mL | PREFILLED_SYRINGE | INTRAMUSCULAR | Status: DC

## 2023-11-23 MED ORDER — CITALOPRAM HYDROBROMIDE 20 MG PO TABS
20.0000 mg | ORAL_TABLET | Freq: Every day | ORAL | Status: DC
  Administered 2023-11-23 – 2023-11-24 (×2): 20 mg via ORAL
  Filled 2023-11-23 (×2): qty 1

## 2023-11-23 NOTE — ED Provider Notes (Signed)
 Signout from Dr. Oletta Cohn.  73 year old female COPD on oxygen, history of heart failure here with shortness of breath nausea vomiting abdominal pain.  Possible aspiration.  Has not been taking her medications.  Significantly tachycardic here.  Getting lab work chest x-ray EKG.  Likely will need admission Physical Exam  BP (!) 144/87 (BP Location: Right Arm)   Pulse (!) 166   Temp 97.9 F (36.6 C) (Oral)   Resp 20   Ht 5' (1.524 m)   Wt 59 kg   SpO2 100%   BMI 25.39 kg/m   Physical Exam  Procedures  Procedures  ED Course / MDM    Medical Decision Making Amount and/or Complexity of Data Reviewed Labs: ordered. Radiology: ordered.  Risk Prescription drug management.   CT without obvious source.  Urine also negative and COVID and flu negative.  She said she feels a little bit better and her tachycardia is improving.  Still with an elevated white count elevated lactate that has not cleared feel she would benefit from mission to the hospital for further evaluation.  Discussed with [facility] Dr. Sherryll Burger who will evaluate her for admission.  Patient updated on plan and in agreement.       Terrilee Files, MD 11/23/23 [phone]

## 2023-11-23 NOTE — H&P (Signed)
 History and Physical    [name] JJO:[id number] DOB: 07-02-1951 DOA: 11/23/2023  PCP: Benetta Spar, MD   Patient coming from: Home  Chief Complaint: Intractable nausea and vomiting  HPI: [name] is a 73 y.o. female with medical history significant for hypertension, chronic systolic heart failure, depression, CVA, anxiety, and COPD with chronic hypoxemia on 3 L nasal cannula oxygen who was recently discharged on 10/19/2023 after drain placement for iliopsoas abscess.  She presented to the ED today with complaints of 3 days of nausea and vomiting as well as some fevers and chills.  She denies any diarrhea and has some mild abdominal pain.  She denies any sick contacts or strange food consumption and states that she has not been able to keep her medications down as a result of her nausea and vomiting.   ED Course: Vital signs with elevated heart rates noted and patient was given IV metoprolol in the ED.  She was also given 500 mL fluid bolus and Zofran for nausea and vomiting.  Leukocytosis of 17,800 noted and potassium 3.8 with creatinine 1.25.  Troponin 28 and glucose 288 with BNP 101 and lactic acid initially 3 and subsequently 2.8.  Chest x-ray with no acute findings and flu and COVID studies negative.  Urinalysis negative.  Review of Systems: Reviewed as noted above, otherwise negative.  Past Medical History:  Diagnosis Date   Anxiety    Blind    right   Colitis 09/07/2011   CVA (cerebral infarction)    GERD (gastroesophageal reflux disease)    Hyperlipidemia    Hypertension    Myocardial infarct (HCC)    1997, 2004   Severe major depression with psychotic features (HCC)    2004   Stroke [facility]) 2002    Past Surgical History:  Procedure Laterality Date   ABDOMINAL HYSTERECTOMY     BILATERAL SALPINGOOPHORECTOMY     BIOPSY  05/08/2022   Procedure: BIOPSY;  Surgeon: Dolores Frame, MD;  Location: [facility];  Service: Gastroenterology;;    CHOLECYSTECTOMY N/A 10/15/2021   Procedure: LAPAROSCOPIC CHOLECYSTECTOMY WITH INTRAOPERATIVE CHOLANGIOGRAM;  Surgeon: Franky Macho, MD;  Location: [facility] ORS;  Service: General;  Laterality: N/A;   COLONOSCOPY N/A 07/17/2014   RMR: Extremely redundant colon. Colonic diverticulosis. Inadequate prepartation precluded examination of all the rectal and colonic.  I suspect slow colonic transit in the setting of a markely redundant colon.   CORONARY ANGIOPLASTY WITH STENT PLACEMENT  1601,0932   cyst removed from left wrist     ERCP N/A 10/10/2021   Procedure: ENDOSCOPIC RETROGRADE CHOLANGIOPANCREATOGRAPHY (ERCP);  Surgeon: Malissa Hippo, MD;  Location: [facility];  Service: Gastroenterology;  Laterality: N/A;   ERCP N/A 10/13/2021   Procedure: ENDOSCOPIC RETROGRADE CHOLANGIOPANCREATOGRAPHY (ERCP);  Surgeon: Malissa Hippo, MD;  Location: [facility];  Service: Gastroenterology;  Laterality: N/A;   ESOPHAGEAL DILATION  05/08/2022   Procedure: ESOPHAGEAL DILATION;  Surgeon: Marguerita Merles, Reuel Boom, MD;  Location: [facility];  Service: Gastroenterology;;  savory dilation'   ESOPHAGOGASTRODUODENOSCOPY  09/05/2004   Dr Jimmye Norman gastritis, candida esophagitis   ESOPHAGOGASTRODUODENOSCOPY  09/07/2011   TFT:DDUK, esophageal in the distal esophagus/Mild gastritis   ESOPHAGOGASTRODUODENOSCOPY (EGD) WITH PROPOFOL N/A 05/08/2022   Procedure: ESOPHAGOGASTRODUODENOSCOPY (EGD) WITH PROPOFOL;  Surgeon: Dolores Frame, MD;  Location: [facility];  Service: Gastroenterology;  Laterality: N/A;  with possible esophageal dilation   Lumps removed from each breast     SPHINCTEROTOMY N/A 10/10/2021   Procedure: SPHINCTEROTOMY INCOMPLETE, STONE  NOT REMOVED;  Surgeon: Malissa Hippo, MD;  Location: [facility];  Service: Gastroenterology;  Laterality: N/A;     reports that she quit smoking about 6 years ago. Her smoking use included cigarettes. She started smoking about 46 years ago. She has a 10 pack-year smoking  history. She has never used smokeless tobacco. She reports current drug use. Drug: Marijuana. She reports that she does not drink alcohol.  Allergies  Allergen Reactions   Aspirin Swelling and Other (See Comments)    Only in large doses will cause a reaction   Bee Venom Swelling    Severe swelling   Contrast Media [Iodinated Contrast Media] Swelling   Lisinopril Swelling   Motrin [Ibuprofen] Swelling   Penicillins Anaphylaxis    Tolerated Rocephin Jan 2025   Dilaudid [Hydromorphone Hcl] Itching    Family History  Problem Relation Age of Onset   Multiple sclerosis Father    Alzheimer's disease Mother    Hypertension Mother    Diabetes Mother    Colon cancer Neg Hx     Prior to Admission medications   Medication Sig Start Date End Date Taking? Authorizing Provider  acetaminophen (TYLENOL) 650 MG CR tablet Take 1,300 mg by mouth every 8 (eight) hours as needed for pain.   Yes [provider]  citalopram (CELEXA) 20 MG tablet Take 20 mg by mouth daily. 11/01/23  Yes [provider]  clopidogrel (PLAVIX) 75 MG tablet Take 1 tablet (75 mg total) by mouth daily. 06/17/22  Yes Leroy Sea, MD  folic acid (FOLVITE) 1 MG tablet Take 1 tablet (1 mg total) by mouth daily. 10/17/21  Yes Tat, Onalee Hua, MD  furosemide (LASIX) 40 MG tablet Take 40 mg by mouth daily. 08/18/23  Yes [provider]  Ipratropium-Albuterol (COMBIVENT) 20-100 MCG/ACT AERS respimat Inhale 1 puff into the lungs every 6 (six) hours as needed for wheezing or shortness of breath. 05/09/22  Yes Vassie Loll, MD  losartan (COZAAR) 25 MG tablet Take 12.5 mg by mouth daily. 10/08/23  Yes [provider]  metoprolol succinate (TOPROL XL) 50 MG 24 hr tablet Take 1 tablet (50 mg total) by mouth daily. Take with or immediately following a meal. 03/30/23  Yes Mallipeddi, Vishnu P, MD  mirtazapine (REMERON) 7.5 MG tablet Take 7.5 mg by mouth at bedtime. 04/27/22  Yes [provider]   montelukast (SINGULAIR) 10 MG tablet Take 10 mg by mouth at bedtime.   Yes [provider]  rosuvastatin (CRESTOR) 20 MG tablet Take 20 mg by mouth daily. 10/26/23  Yes [provider]  STIOLTO RESPIMAT 2.5-2.5 MCG/ACT AERS SMARTSIG:2 Puff(s) Via Inhaler Daily 10/25/23  Yes [provider]  traMADol (ULTRAM) 50 MG tablet Take 50 mg by mouth 2 (two) times daily. 10/21/23  Yes [provider]  torsemide (DEMADEX) 20 MG tablet Take 40 mg by mouth 2 (two) times daily.    [provider]  vitamin B-12 (CYANOCOBALAMIN) 500 MCG tablet Take 1 tablet (500 mcg total) by mouth daily. 10/16/21   Catarina Hartshorn, MD    Physical Exam: Vitals:   11/23/23 0850 11/23/23 0910 11/23/23 1128 11/23/23 1201  BP: 138/88 (!) 144/83  (!) 159/85  Pulse: (!) 103 (!) 103 (!) 105 (!) 129  Resp: 18 20 (!) 25 20  Temp:    98.8 F (37.1 C)  TempSrc:    Oral  SpO2: 100% 100% 100% 100%  Weight:      Height:  Constitutional: NAD, calm, comfortable Vitals:   11/23/23 0850 11/23/23 0910 11/23/23 1128 11/23/23 1201  BP: 138/88 (!) 144/83  (!) 159/85  Pulse: (!) 103 (!) 103 (!) 105 (!) 129  Resp: 18 20 (!) 25 20  Temp:    98.8 F (37.1 C)  TempSrc:    Oral  SpO2: 100% 100% 100% 100%  Weight:      Height:       Eyes: lids and conjunctivae normal Neck: normal, supple Respiratory: clear to auscultation bilaterally. Normal respiratory effort. No accessory muscle use.  3 L nasal cannula oxygen Cardiovascular: Regular rate and rhythm, no murmurs. Abdomen: Minimal tenderness to palpation, no distention. Bowel sounds positive.  Musculoskeletal:  No edema. Skin: no rashes, lesions, ulcers.  Psychiatric: Flat affect  Labs on Admission: I have personally reviewed following labs and imaging studies  CBC: Recent Labs  Lab 11/23/23 0639  WBC 17.8*  NEUTROABS 11.5*  HGB 11.8*  HCT 38.2  MCV 104.9*  PLT 534*   Basic Metabolic Panel: Recent Labs  Lab 11/23/23 0639  NA  140  K 3.4*  CL 104  CO2 23  GLUCOSE 288*  BUN 8  CREATININE 1.25*  CALCIUM 7.6*   GFR: Estimated Creatinine Clearance: 31.7 mL/min (A) (by C-G formula based on SCr of 1.25 mg/dL (H)). Liver Function Tests: Recent Labs  Lab 11/23/23 0639  AST 38  ALT 13  ALKPHOS 278*  BILITOT 0.7  PROT 8.4*  ALBUMIN 3.0*   No results for input(s): "LIPASE", "AMYLASE" in the last 168 hours. No results for input(s): "AMMONIA" in the last 168 hours. Coagulation Profile: Recent Labs  Lab 11/23/23 0712  INR 1.0   Cardiac Enzymes: No results for input(s): "CKTOTAL", "CKMB", "CKMBINDEX", "TROPONINI" in the last 168 hours. BNP (last 3 results) No results for input(s): "PROBNP" in the last 8760 hours. HbA1C: No results for input(s): "HGBA1C" in the last 72 hours. CBG: No results for input(s): "GLUCAP" in the last 168 hours. Lipid Profile: No results for input(s): "CHOL", "HDL", "LDLCALC", "TRIG", "CHOLHDL", "LDLDIRECT" in the last 72 hours. Thyroid Function Tests: No results for input(s): "TSH", "T4TOTAL", "FREET4", "T3FREE", "THYROIDAB" in the last 72 hours. Anemia Panel: No results for input(s): "VITAMINB12", "FOLATE", "FERRITIN", "TIBC", "IRON", "RETICCTPCT" in the last 72 hours. Urine analysis:    Component Value Date/Time   COLORURINE YELLOW 11/23/2023 0911   APPEARANCEUR HAZY (A) 11/23/2023 0911   LABSPEC 1.024 11/23/2023 0911   PHURINE 5.0 11/23/2023 0911   GLUCOSEU NEGATIVE 11/23/2023 0911   HGBUR NEGATIVE 11/23/2023 0911   BILIRUBINUR NEGATIVE 11/23/2023 0911   KETONESUR NEGATIVE 11/23/2023 0911   PROTEINUR 30 (A) 11/23/2023 0911   UROBILINOGEN 1.0 07/16/2014 0244   NITRITE NEGATIVE 11/23/2023 0911   LEUKOCYTESUR NEGATIVE 11/23/2023 0911    Radiological Exams on Admission: CT CHEST ABDOMEN PELVIS WO CONTRAST Result Date: 11/23/2023 CLINICAL DATA:  73 year old female with sepsis, vomiting, body ache. History of right psoas abscess drainage by CT guidance in January. EXAM:  CT CHEST, ABDOMEN AND PELVIS WITHOUT CONTRAST TECHNIQUE: Multidetector CT imaging of the chest, abdomen and pelvis was performed following the standard protocol without IV contrast. RADIATION DOSE REDUCTION: This exam was performed according to the departmental dose-optimization program which includes automated exposure control, adjustment of the mA and/or kV according to patient size and/or use of iterative reconstruction technique. COMPARISON:  CTA chest 05/29/2022. CT Abdomen and Pelvis 10/18/2023 and earlier. FINDINGS: CT CHEST FINDINGS Cardiovascular: Extensive Calcified aortic atherosclerosis. Calcified coronary artery atherosclerosis. Heart size remains  normal. No pericardial effusion. Vascular patency is not evaluated in the absence of IV contrast. Mediastinum/Nodes: Negative. Lungs/Pleura: Bullous emphysema, moderate to severe and worse in the right lung. Major airways are patent with trace retained secretions in the trachea. No pleural effusion. No consolidation. Subpleural lung scarring and architectural distortion greater in the right lung. No convincing active lung inflammation. Musculoskeletal: Up to moderate thoracic vertebral compression fractures T5 through T7 are new since 2023, age indeterminate although partially sclerotic at each level. No retropulsion or other complicating features identified. Background generalized osteopenia. Stable thoracic vertebral height otherwise since 2023. Chronic appearing mildly displaced right lateral 2nd rib fracture was not apparent in 2023. No definite acute rib fracture. CT ABDOMEN PELVIS FINDINGS Hepatobiliary: Chronic cholecystectomy.  Stable noncontrast liver. Pancreas: Stable, negative. Spleen: Stable, negative. Adrenals/Urinary Tract: Normal adrenal glands. Left renal upper pole simple fluid density cyst (no follow-up imaging recommended). No hydronephrosis. No nephrolithiasis. Postinflammatory mild soft tissue thickening and nodularity along the posterior  right pararenal space is stable to regressed since January (series 2, image 71). No progressive findings. Decompressed ureters. Unremarkable bladder. Occasional pelvic phleboliths. Stomach/Bowel: Redundant large bowel, but decompressed from the splenic flexure through the rectum. Evidence of prior appendectomy at the cecum on series 2, image 87. No large bowel inflammation identified. No dilated small bowel. Noncontrast stomach and duodenum appear negative. No free air or free fluid identified. Vascular/Lymphatic: Very severe Aortoiliac calcified atherosclerosis. Vascular patency is not evaluated in the absence of IV contrast. 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 No lymphadenopathy identified. Reproductive: Surgically absent uterus. Diminutive or absent ovaries. Other: Further regression of right posterior paraspinal, flank soft tissue inflammation since 10/18/2023. Percutaneous drain to the right ilial lumbar muscles at the pelvic inlet has been removed since that time. No progressive findings identified. No pelvis free fluid. Musculoskeletal: Maintained lumbar vertebral height, alignment, endplates. L5-S1 degenerative vacuum disc again noted. Generalized osteopenia. Sacrum, SI joints, pelvis and proximal femurs appear intact. IMPRESSION: 1. Removed percutaneous right iliopsoas muscle drain and further regression of regional postinflammatory soft tissue changes since January. 2. No acute finding in the noncontrast abdomen or pelvis. 3. Age indeterminate T5 through T7 vertebral compression fractures, new since 2023. No retropulsion or complicating features. Underlying osteopenia. 4. Severe Emphysema (ICD10-J43.9). 5. Severe Aortic Atherosclerosis (ICD10-I70.0). Electronically Signed   By: Odessa Fleming M.D.   On: 11/23/2023 08:57   DG Chest Port 1 View Result Date: 11/23/2023 CLINICAL DATA:  73 year old female with possible sepsis. Vomiting and body ache. EXAM: PORTABLE CHEST 1 VIEW  COMPARISON:  Chest CTA 05/29/2022. More recent portable chest 10/17/2023. FINDINGS: Portable AP upright view at 0641 hours. Moderate to severe emphysema, with asymmetric bullous changes in the upper lobes on 2023 CTA. Stable large lung volumes. No pneumothorax, pulmonary edema, pleural effusion or acute pulmonary opacity. Calcified aortic atherosclerosis. Other mediastinal contours are within normal limits. Visualized tracheal air column is within normal limits. No acute osseous abnormality identified. Negative visible bowel gas. IMPRESSION: Emphysema (ICD10-J43.9). No acute cardiopulmonary abnormality. Electronically Signed   By: Odessa Fleming M.D.   On: 11/23/2023 07:09    EKG: Independently reviewed. 157bpm.  Assessment/Plan Principal Problem:   Intractable nausea and vomiting Active Problems:   Hypokalemia   AKI (acute kidney injury) (HCC)   Chronic hypoxic respiratory failure (HCC)   Chronic systolic CHF (congestive heart failure) (HCC)   Elevated troponin   Essential hypertension   Impaired glucose tolerance   COPD GOLD ?   CAD (  coronary artery disease)    Intractable nausea and vomiting likely related to acute gastroenteritis -Gentle IV fluid hydration, time-limited in the setting of CHF -Zofran as needed for nausea or vomiting -Clear liquid diet and advance as tolerated  Mild hypokalemia -Likely related to above -Replete and monitor  Prerenal AKI -Likely due to dehydration from above -Baseline creatinine around 0.7-0.8 -Hydrate and monitor -Avoid nephrotoxic agents  Leukocytosis with lactic acidosis -Likely reactive and in the setting of dehydration -Continue to monitor -No current signs of infection  COPD with chronic hypoxemia -Stable on 3 L nasal cannula  Hypertension with chronic systolic CHF -Last 2D echocardiogram 03/2023 with EF 50% -Hold losartan -Continue metoprolol -Hold torsemide  Hyperglycemia -No history of diabetes, check A1c -SSI every 4 hours  DVT  prophylaxis: Heparin Code Status: Full Family Communication: Son at bedside Disposition Plan:Admit for N/V Consults called:None Admission status: Obs, Tele  Severity of Illness: The appropriate patient status for this patient is OBSERVATION. Observation status is judged to be reasonable and necessary in order to provide the required intensity of service to ensure the patient's safety. The patient's presenting symptoms, physical exam findings, and initial radiographic and laboratory data in the context of their medical condition is felt to place them at decreased risk for further clinical deterioration. Furthermore, it is anticipated that the patient will be medically stable for discharge from the hospital within 2 midnights of admission.    [name] D Sherryll Burger DO [facility]  If 7PM-7AM, please contact night-coverage [email or web address]  11/23/2023, 12:43 PM

## 2023-11-23 NOTE — Progress Notes (Signed)
 Patients HR 146 and sustaining in the 140's , notified Dr. Sherryll Burger gave patient PO metoprolol scheduled

## 2023-11-23 NOTE — ED Notes (Signed)
 X-ray at bedside

## 2023-11-23 NOTE — ED Provider Notes (Signed)
 [place] EMERGENCY DEPARTMENT AT [facility] Provider Note   CSN: [id number] Arrival date & time: 11/23/23  4098     History  Chief Complaint  Patient presents with   Emesis    [name] is a 73 y.o. female.  Patient presents to the emergency department stating that she has had nausea, vomiting, abdominal pain and shortness of breath for at least 2 days.  She reports that she thought it would pass on its own but has not gotten better.  She is on oxygen at home.  Patient reports no diarrhea.  She thinks she may have had fevers at home.       Home Medications Prior to Admission medications   Medication Sig Start Date End Date Taking? Authorizing Provider  acetaminophen (TYLENOL) 650 MG CR tablet Take 1,300 mg by mouth every 8 (eight) hours as needed for pain.    [provider]  clopidogrel (PLAVIX) 75 MG tablet Take 1 tablet (75 mg total) by mouth daily. 06/17/22   Leroy Sea, MD  folic acid (FOLVITE) 1 MG tablet Take 1 tablet (1 mg total) by mouth daily. 10/17/21   Catarina Hartshorn, MD  Ipratropium-Albuterol (COMBIVENT) 20-100 MCG/ACT AERS respimat Inhale 1 puff into the lungs every 6 (six) hours as needed for wheezing or shortness of breath. 05/09/22   Vassie Loll, MD  losartan (COZAAR) 25 MG tablet Take 12.5 mg by mouth daily. 10/08/23   [provider]  metoprolol succinate (TOPROL XL) 50 MG 24 hr tablet Take 1 tablet (50 mg total) by mouth daily. Take with or immediately following a meal. 03/30/23   Mallipeddi, Vishnu P, MD  mirtazapine (REMERON) 7.5 MG tablet Take 7.5 mg by mouth at bedtime. 04/27/22   [provider]  montelukast (SINGULAIR) 10 MG tablet Take 10 mg by mouth at bedtime.    [provider]  torsemide (DEMADEX) 20 MG tablet Take 40 mg by mouth 2 (two) times daily.    [provider]  vitamin B-12 (CYANOCOBALAMIN) 500 MCG tablet Take 1 tablet (500 mcg total) by mouth daily. 10/16/21   Catarina Hartshorn, MD       Allergies    Aspirin, Bee venom, Contrast media [iodinated contrast media], Lisinopril, Motrin [ibuprofen], Penicillins, and Dilaudid [hydromorphone hcl]    Review of Systems   Review of Systems  Physical Exam Updated Vital Signs BP (!) 144/87 (BP Location: Right Arm)   Pulse (!) 166   Temp 97.9 F (36.6 C) (Oral)   Resp 20   Ht 5' (1.524 m)   Wt 59 kg   SpO2 100%   BMI 25.39 kg/m  Physical Exam Vitals and nursing note reviewed.  Constitutional:      General: She is not in acute distress.    Appearance: She is well-developed.  HENT:     Head: Normocephalic and atraumatic.     Mouth/Throat:     Mouth: Mucous membranes are moist.  Eyes:     General: Vision grossly intact. Gaze aligned appropriately.     Extraocular Movements: Extraocular movements intact.     Conjunctiva/sclera: Conjunctivae normal.  Cardiovascular:     Rate and Rhythm: Regular rhythm. Tachycardia present.     Pulses: Normal pulses.     Heart sounds: Normal heart sounds, S1 normal and S2 normal. No murmur heard.    No friction rub. No gallop.  Pulmonary:     Effort: Pulmonary effort is normal. No respiratory distress.     Breath  sounds: Normal breath sounds.  Abdominal:     General: Bowel sounds are normal.     Palpations: Abdomen is soft.     Tenderness: There is generalized abdominal tenderness. There is no guarding or rebound.     Hernia: No hernia is present.  Musculoskeletal:        General: No swelling.     Cervical back: Full passive range of motion without pain, normal range of motion and neck supple. No spinous process tenderness or muscular tenderness. Normal range of motion.     Right lower leg: No edema.     Left lower leg: No edema.  Skin:    General: Skin is warm and dry.     Capillary Refill: Capillary refill takes less than 2 seconds.     Findings: No ecchymosis, erythema, rash or wound.  Neurological:     General: No focal deficit present.     Mental Status: She is alert and  oriented to person, place, and time.     GCS: GCS eye subscore is 4. GCS verbal subscore is 5. GCS motor subscore is 6.     Cranial Nerves: Cranial nerves 2-12 are intact.     Sensory: Sensation is intact.     Motor: Motor function is intact.     Coordination: Coordination is intact.  Psychiatric:        Attention and Perception: Attention normal.        Mood and Affect: Mood normal.        Speech: Speech normal.        Behavior: Behavior normal.     ED Results / Procedures / Treatments   Labs (all labs ordered are listed, but only abnormal results are displayed) Labs Reviewed  CULTURE, BLOOD (ROUTINE X 2)  CULTURE, BLOOD (ROUTINE X 2)  LACTIC ACID, PLASMA  LACTIC ACID, PLASMA  COMPREHENSIVE METABOLIC PANEL  CBC WITH DIFFERENTIAL/PLATELET  PROTIME-INR  APTT  URINALYSIS, W/ REFLEX TO CULTURE (INFECTION SUSPECTED)    EKG EKG Interpretation Date/Time:  Tuesday November 23 2023 06:17:59 EST Ventricular Rate:  157 PR Interval:  173 QRS Duration:  135 QT Interval:  325 QTC Calculation: 526 R Axis:   248  Text Interpretation: Wide-QRS tachycardia Nonspecific IVCD with LAD Baseline wander in lead(s) I II aVR When compared with ECG of 30-Mar-2023 15:10:16 rate is increased, otherwise no change Confirmed by Gilda Crease [phone]) on 11/23/2023 6:20:43 AM  Radiology No results found.  Procedures Procedures    Medications Ordered in ED Medications - No data to display  ED Course/ Medical Decision Making/ A&P                                 Medical Decision Making Amount and/or Complexity of Data Reviewed Labs: ordered. Radiology: ordered.   Differential Diagnosis considered includes, but not limited to: Cholelithiasis; cholecystitis; cholangitis; bowel obstruction; esophagitis; gastritis; peptic ulcer disease; pancreatitis; cardiac; aspiration pneumonia  Dents to the emergency department for evaluation of abdominal pain, nausea, vomiting.  Symptoms ongoing for 2  days.  Patient tachycardic at arrival.  She reports she has not been able to hold down her medications for the last couple of days.  She does have chronic lung disease, is on oxygen at home.  With persistent vomiting, aspiration pneumonia would be considered.  Workup for infectious etiologies initiated.  Will sign to oncoming ER physician to take over and follow results.  Final Clinical Impression(s) / ED Diagnoses Final diagnoses:  Nausea and vomiting, unspecified vomiting type  Generalized abdominal pain  Shortness of breath    Rx / DC Orders ED Discharge Orders     None         Gilda Crease, MD 11/23/23 [phone]

## 2023-11-23 NOTE — ED Triage Notes (Signed)
 Pt c/o vomiting and body aches x 2 days with frequency of bowel movements  Pt denies any sick contacts

## 2023-11-24 DIAGNOSIS — R112 Nausea with vomiting, unspecified: Secondary | ICD-10-CM | POA: Diagnosis not present

## 2023-11-24 LAB — GLUCOSE, CAPILLARY
Glucose-Capillary: 151 mg/dL — ABNORMAL HIGH (ref 70–99)
Glucose-Capillary: 112 mg/dL — ABNORMAL HIGH (ref 70–99)
Glucose-Capillary: 173 mg/dL — ABNORMAL HIGH (ref 70–99)

## 2023-11-24 LAB — COMPREHENSIVE METABOLIC PANEL
ALT: 13 U/L (ref 0–44)
AST: 29 U/L (ref 15–41)
Albumin: 2.6 g/dL — ABNORMAL LOW (ref 3.5–5.0)
Alkaline Phosphatase: 223 U/L — ABNORMAL HIGH (ref 38–126)
Anion gap: 11 (ref 5–15)
BUN: 5 mg/dL — ABNORMAL LOW (ref 8–23)
CO2: 24 mmol/L (ref 22–32)
Calcium: 7.4 mg/dL — ABNORMAL LOW (ref 8.9–10.3)
Chloride: 106 mmol/L (ref 98–111)
Creatinine, Ser: 0.95 mg/dL (ref 0.44–1.00)
GFR, Estimated: >60 mL/min (ref >60)
Glucose, Bld: 138 mg/dL — ABNORMAL HIGH (ref 70–99)
Potassium: 3.2 mmol/L — ABNORMAL LOW (ref 3.5–5.1)
Sodium: 141 mmol/L (ref 135–145)
Total Bilirubin: 0.5 mg/dL (ref 0.0–1.2)
Total Protein: 7.2 g/dL (ref 6.5–8.1)

## 2023-11-24 LAB — CBC
HCT: 35 % — ABNORMAL LOW (ref 36.0–46.0)
Hemoglobin: 10.5 g/dL — ABNORMAL LOW (ref 12.0–15.0)
MCH: 31.9 pg (ref 26.0–34.0)
MCHC: 30 g/dL (ref 30.0–36.0)
MCV: 106.4 fL — ABNORMAL HIGH (ref 80.0–100.0)
Platelets: 419 10*3/uL — ABNORMAL HIGH (ref 150–400)
RBC: 3.29 MIL/uL — ABNORMAL LOW (ref 3.87–5.11)
RDW: 14 % (ref 11.5–15.5)
WBC: 14.4 10*3/uL — ABNORMAL HIGH (ref 4.0–10.5)
nRBC: 0 % (ref 0.0–0.2)

## 2023-11-24 LAB — MAGNESIUM: Magnesium: 1 mg/dL — ABNORMAL LOW (ref 1.7–2.4)

## 2023-11-24 LAB — LACTIC ACID, PLASMA: Lactic Acid, Venous: 1.9 mmol/L (ref 0.5–1.9)

## 2023-11-24 MED ORDER — ONDANSETRON HCL 4 MG PO TABS: 4.0000 mg | ORAL_TABLET | Freq: Three times a day (TID) | ORAL | 1 refills | Status: DC | PRN

## 2023-11-24 MED ORDER — POTASSIUM CHLORIDE CRYS ER 20 MEQ PO TBCR
40.0000 meq | EXTENDED_RELEASE_TABLET | Freq: Two times a day (BID) | ORAL | Status: DC
  Administered 2023-11-24: 40 meq via ORAL
  Filled 2023-11-24: qty 2

## 2023-11-24 NOTE — Care Management Obs Status (Signed)
 MEDICARE OBSERVATION STATUS NOTIFICATION   Patient Details  Name: [name] MRN: [id number] Date of Birth: 07-31-1951   Medicare Observation Status Notification Given:  Yes    Corey Harold 11/24/2023, 10:37 AM

## 2023-11-24 NOTE — Progress Notes (Signed)
   11/24/23 0958  TOC Brief Assessment  Insurance and Status Reviewed  Patient has primary care physician Yes  Home environment has been reviewed Home  Prior level of function: Independent  Prior/Current Home Services No current home services  Social Drivers of Health Review SDOH reviewed no interventions necessary  Readmission risk has been reviewed Yes  Transition of care needs no transition of care needs at this time   Planning to discharge back home today, no needs identified. Transition of Care Department [facility]) has reviewed patient and no TOC needs have been identified at this time. We will continue to monitor patient advancement through interdisciplinary progression rounds. If new patient transition needs arise, please place a TOC consult.

## 2023-11-24 NOTE — Plan of Care (Signed)
   Problem: Education: Goal: Knowledge of General Education information will improve Description Including pain rating scale, medication(s)/side effects and non-pharmacologic comfort measures Outcome: Progressing

## 2023-11-24 NOTE — Discharge Summary (Signed)
 Physician Discharge Summary  [name] ZOX:[id number] DOB: 23-Jan-1951 DOA: 11/23/2023  PCP: Benetta Spar, MD  Admit date: 11/23/2023  Discharge date: 11/24/2023  Admitted From:Home  Disposition:  Home  Recommendations for Outpatient Follow-up:  Follow up with PCP in 1-2 weeks Use Zofran as needed for nausea or vomiting Continue other home medications as prior  Home Health: None  Equipment/Devices: Has home 3 L nasal cannula oxygen  Discharge Condition:Stable  CODE STATUS: Full  Diet recommendation: Heart Healthy  Brief/Interim Summary: [name] is a 73 y.o. female with medical history significant for hypertension, chronic systolic heart failure, depression, CVA, anxiety, and COPD with chronic hypoxemia on 3 L nasal cannula oxygen who was recently discharged on 10/19/2023 after drain placement for iliopsoas abscess.  She presented to the ED today with complaints of 3 days of nausea and vomiting as well as some fevers and chills.  She was symptomatically managed with IV fluids and antiemetics for suspected acute gastroenteritis likely of viral origin.  She was also noted to have some prerenal AKI which has resolved with use of IV fluid.  She may resume her usual home medications and is now able to tolerate diet with no further nausea or vomiting noted.  Discharge Diagnoses:  Principal Problem:   Intractable nausea and vomiting Active Problems:   Hypokalemia   AKI (acute kidney injury) (HCC)   Chronic hypoxic respiratory failure (HCC)   Chronic systolic CHF (congestive heart failure) (HCC)   Elevated troponin   Essential hypertension   Impaired glucose tolerance   COPD GOLD ?   CAD (coronary artery disease)  Principal discharge diagnosis: Intractable nausea and vomiting likely related to acute gastroenteritis with associated prerenal AKI.  Discharge Instructions  Discharge Instructions     Diet - low sodium heart healthy   Complete by: As directed     Increase activity slowly   Complete by: As directed       Allergies as of 11/24/2023       Reactions   Aspirin Swelling, Other (See Comments)   Only in large doses will cause a reaction   Bee Venom Swelling   Severe swelling   Contrast Media [iodinated Contrast Media] Swelling   Lisinopril Swelling   Motrin [ibuprofen] Swelling   Penicillins Anaphylaxis   Tolerated Rocephin Jan 2025   Dilaudid [hydromorphone Hcl] Itching        Medication List     TAKE these medications    acetaminophen 650 MG CR tablet Commonly known as: TYLENOL Take 1,300 mg by mouth every 8 (eight) hours as needed for pain.   aspirin EC 81 MG tablet Take 81 mg by mouth daily. Swallow whole.   citalopram 20 MG tablet Commonly known as: CELEXA Take 20 mg by mouth daily.   cloNIDine 0.3 MG tablet Commonly known as: CATAPRES Take 0.3 mg by mouth 2 (two) times daily.   clopidogrel 75 MG tablet Commonly known as: PLAVIX Take 1 tablet (75 mg total) by mouth daily.   cyanocobalamin 500 MCG tablet Commonly known as: VITAMIN B12 Take 1 tablet (500 mcg total) by mouth daily.   folic acid 1 MG tablet Commonly known as: FOLVITE Take 1 tablet (1 mg total) by mouth daily.   Ipratropium-Albuterol 20-100 MCG/ACT Aers respimat Commonly known as: COMBIVENT Inhale 1 puff into the lungs every 6 (six) hours as needed for wheezing or shortness of breath.   losartan 25 MG tablet Commonly known as: COZAAR Take 12.5 mg by mouth daily.  metoprolol succinate 50 MG 24 hr tablet Commonly known as: Toprol XL Take 1 tablet (50 mg total) by mouth daily. Take with or immediately following a meal.   mirtazapine 7.5 MG tablet Commonly known as: REMERON Take 7.5 mg by mouth at bedtime.   montelukast 10 MG tablet Commonly known as: SINGULAIR Take 10 mg by mouth at bedtime.   ondansetron 4 MG tablet Commonly known as: Zofran Take 1 tablet (4 mg total) by mouth every 8 (eight) hours as needed for nausea or  vomiting.   pantoprazole 40 MG tablet Commonly known as: PROTONIX Take 40 mg by mouth daily.   Stiolto Respimat 2.5-2.5 MCG/ACT Aers Generic drug: Tiotropium Bromide-Olodaterol SMARTSIG:2 Puff(s) Via Inhaler Daily   torsemide 20 MG tablet Commonly known as: DEMADEX Take 40 mg by mouth 2 (two) times daily.   traMADol 50 MG tablet Commonly known as: ULTRAM Take 50 mg by mouth 2 (two) times daily.        Follow-up Information     [name], Wayland Salinas, MD. Schedule an appointment as soon as possible for a visit in 1 week(s).   Specialty: Internal Medicine Contact information: [address] [place] [address] [phone]                Allergies  Allergen Reactions   Aspirin Swelling and Other (See Comments)    Only in large doses will cause a reaction   Bee Venom Swelling    Severe swelling   Contrast Media [Iodinated Contrast Media] Swelling   Lisinopril Swelling   Motrin [Ibuprofen] Swelling   Penicillins Anaphylaxis    Tolerated Rocephin Jan 2025   Dilaudid [Hydromorphone Hcl] Itching    Consultations: None   Procedures/Studies: CT CHEST ABDOMEN PELVIS WO CONTRAST Result Date: 11/23/2023 CLINICAL DATA:  73 year old female with sepsis, vomiting, body ache. History of right psoas abscess drainage by CT guidance in January. EXAM: CT CHEST, ABDOMEN AND PELVIS WITHOUT CONTRAST TECHNIQUE: Multidetector CT imaging of the chest, abdomen and pelvis was performed following the standard protocol without IV contrast. RADIATION DOSE REDUCTION: This exam was performed according to the departmental dose-optimization program which includes automated exposure control, adjustment of the mA and/or kV according to patient size and/or use of iterative reconstruction technique. COMPARISON:  CTA chest 05/29/2022. CT Abdomen and Pelvis 10/18/2023 and earlier. FINDINGS: CT CHEST FINDINGS Cardiovascular: Extensive Calcified aortic atherosclerosis. Calcified coronary  artery atherosclerosis. Heart size remains normal. No pericardial effusion. Vascular patency is not evaluated in the absence of IV contrast. Mediastinum/Nodes: Negative. Lungs/Pleura: Bullous emphysema, moderate to severe and worse in the right lung. Major airways are patent with trace retained secretions in the trachea. No pleural effusion. No consolidation. Subpleural lung scarring and architectural distortion greater in the right lung. No convincing active lung inflammation. Musculoskeletal: Up to moderate thoracic vertebral compression fractures T5 through T7 are new since 2023, age indeterminate although partially sclerotic at each level. No retropulsion or other complicating features identified. Background generalized osteopenia. Stable thoracic vertebral height otherwise since 2023. Chronic appearing mildly displaced right lateral 2nd rib fracture was not apparent in 2023. No definite acute rib fracture. CT ABDOMEN PELVIS FINDINGS Hepatobiliary: Chronic cholecystectomy.  Stable noncontrast liver. Pancreas: Stable, negative. Spleen: Stable, negative. Adrenals/Urinary Tract: Normal adrenal glands. Left renal upper pole simple fluid density cyst (no follow-up imaging recommended). No hydronephrosis. No nephrolithiasis. Postinflammatory mild soft tissue thickening and nodularity along the posterior right pararenal space is stable to regressed since January (series 2, image 71). No progressive findings.  Decompressed ureters. Unremarkable bladder. Occasional pelvic phleboliths. Stomach/Bowel: Redundant large bowel, but decompressed from the splenic flexure through the rectum. Evidence of prior appendectomy at the cecum on series 2, image 87. No large bowel inflammation identified. No dilated small bowel. Noncontrast stomach and duodenum appear negative. No free air or free fluid identified. Vascular/Lymphatic: Very severe Aortoiliac calcified atherosclerosis. Vascular patency is not evaluated in the absence of IV  contrast. 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 No lymphadenopathy identified. Reproductive: Surgically absent uterus. Diminutive or absent ovaries. Other: Further regression of right posterior paraspinal, flank soft tissue inflammation since 10/18/2023. Percutaneous drain to the right ilial lumbar muscles at the pelvic inlet has been removed since that time. No progressive findings identified. No pelvis free fluid. Musculoskeletal: Maintained lumbar vertebral height, alignment, endplates. L5-S1 degenerative vacuum disc again noted. Generalized osteopenia. Sacrum, SI joints, pelvis and proximal femurs appear intact. IMPRESSION: 1. Removed percutaneous right iliopsoas muscle drain and further regression of regional postinflammatory soft tissue changes since January. 2. No acute finding in the noncontrast abdomen or pelvis. 3. Age indeterminate T5 through T7 vertebral compression fractures, new since 2023. No retropulsion or complicating features. Underlying osteopenia. 4. Severe Emphysema (ICD10-J43.9). 5. Severe Aortic Atherosclerosis (ICD10-I70.0). Electronically Signed   By: Odessa Fleming M.D.   On: 11/23/2023 08:57   DG Chest Port 1 View Result Date: 11/23/2023 CLINICAL DATA:  73 year old female with possible sepsis. Vomiting and body ache. EXAM: PORTABLE CHEST 1 VIEW COMPARISON:  Chest CTA 05/29/2022. More recent portable chest 10/17/2023. FINDINGS: Portable AP upright view at 0641 hours. Moderate to severe emphysema, with asymmetric bullous changes in the upper lobes on 2023 CTA. Stable large lung volumes. No pneumothorax, pulmonary edema, pleural effusion or acute pulmonary opacity. Calcified aortic atherosclerosis. Other mediastinal contours are within normal limits. Visualized tracheal air column is within normal limits. No acute osseous abnormality identified. Negative visible bowel gas. IMPRESSION: Emphysema (ICD10-J43.9). No acute cardiopulmonary abnormality. Electronically  Signed   By: Odessa Fleming M.D.   On: 11/23/2023 07:09     Discharge Exam: Vitals:   11/24/23 0341 11/24/23 1318  BP: (!) 149/88 131/68  Pulse: (!) 105 91  Resp: 19 18  Temp: 98.4 F (36.9 C) 98.3 F (36.8 C)  SpO2: 100% 100%   Vitals:   11/23/23 1654 11/23/23 2100 11/24/23 0341 11/24/23 1318  BP:  (!) 154/73 (!) 149/88 131/68  Pulse: (!) 107 94 (!) 105 91  Resp:  19 19 18   Temp:  98.1 F (36.7 C) 98.4 F (36.9 C) 98.3 F (36.8 C)  TempSrc:  Oral Oral Oral  SpO2:  100% 100% 100%  Weight:      Height:        General: Pt is alert, awake, not in acute distress Cardiovascular: RRR, S1/S2 +, no rubs, no gallops Respiratory: CTA bilaterally, no wheezing, no rhonchi Abdominal: Soft, NT, ND, bowel sounds + Extremities: no edema, no cyanosis    The results of significant diagnostics from this hospitalization (including imaging, microbiology, ancillary and laboratory) are listed below for reference.     Microbiology: Recent Results (from the past 240 hours)  Blood Culture (routine x 2)     Status: None (Preliminary result)   Collection Time: 11/23/23  7:12 AM   Specimen: BLOOD  Result Value Ref Range Status   Specimen Description BLOOD LEFT ANTECUBITAL  Final   Special Requests   Final    BOTTLES DRAWN AEROBIC AND ANAEROBIC Blood Culture adequate volume  Culture   Final    NO GROWTH < 24 HOURS Performed at [facility], [address], [place], [place] [address]    Report Status PENDING  Incomplete  Blood Culture (routine x 2)     Status: None (Preliminary result)   Collection Time: 11/23/23  7:12 AM   Specimen: BLOOD  Result Value Ref Range Status   Specimen Description BLOOD RIGHT ANTECUBITAL  Final   Special Requests   Final    BOTTLES DRAWN AEROBIC AND ANAEROBIC Blood Culture adequate volume   Culture   Final    NO GROWTH < 24 HOURS Performed at [facility], [address], [place], [place] [address]    Report Status PENDING  Incomplete  Resp panel by RT-PCR  (RSV, Flu A&B, Covid) Urine, Clean Catch     Status: None   Collection Time: 11/23/23  9:11 AM   Specimen: Urine, Clean Catch; Nasal Swab  Result Value Ref Range Status   SARS Coronavirus 2 by RT PCR NEGATIVE NEGATIVE Final    Comment: (NOTE) SARS-CoV-2 target nucleic acids are NOT DETECTED.  The SARS-CoV-2 RNA is generally detectable in upper respiratory specimens during the acute phase of infection. The lowest concentration of SARS-CoV-2 viral copies this assay can detect is 138 copies/mL. A negative result does not preclude SARS-Cov-2 infection and should not be used as the sole basis for treatment or other patient management decisions. A negative result may occur with  improper specimen collection/handling, submission of specimen other than nasopharyngeal swab, presence of viral mutation(s) within the areas targeted by this assay, and inadequate number of viral copies(<138 copies/mL). A negative result must be combined with clinical observations, patient history, and epidemiological information. The expected result is Negative.  Fact Sheet for Patients:  BloggerCourse.com  Fact Sheet for Healthcare Providers:  SeriousBroker.it  This test is no t yet approved or cleared by the [place] FDA and  has been authorized for detection and/or diagnosis of SARS-CoV-2 by FDA under an Emergency Use Authorization (EUA). This EUA will remain  in effect (meaning this test can be used) for the duration of the COVID-19 declaration under Section 564(b)(1) of the Act, 21 U.S.C.section 360bbb-3(b)(1), unless the authorization is terminated  or revoked sooner.       Influenza A by PCR NEGATIVE NEGATIVE Final   Influenza B by PCR NEGATIVE NEGATIVE Final    Comment: (NOTE) The Xpert Xpress SARS-CoV-2/FLU/RSV plus assay is intended as an aid in the diagnosis of influenza from Nasopharyngeal swab specimens and should not be used as a sole  basis for treatment. Nasal washings and aspirates are unacceptable for Xpert Xpress SARS-CoV-2/FLU/RSV testing.  Fact Sheet for Patients: BloggerCourse.com  Fact Sheet for Healthcare Providers: SeriousBroker.it  This test is not yet approved or cleared by the [place] FDA and has been authorized for detection and/or diagnosis of SARS-CoV-2 by FDA under an Emergency Use Authorization (EUA). This EUA will remain in effect (meaning this test can be used) for the duration of the COVID-19 declaration under Section 564(b)(1) of the Act, 21 U.S.C. section 360bbb-3(b)(1), unless the authorization is terminated or revoked.     Resp Syncytial Virus by PCR NEGATIVE NEGATIVE Final    Comment: (NOTE) Fact Sheet for Patients: BloggerCourse.com  Fact Sheet for Healthcare Providers: SeriousBroker.it  This test is not yet approved or cleared by the [place] FDA and has been authorized for detection and/or diagnosis of SARS-CoV-2 by FDA under an Emergency Use Authorization (EUA). This EUA will remain  in effect (meaning this test can be used) for the duration of the COVID-19 declaration under Section 564(b)(1) of the Act, 21 U.S.C. section 360bbb-3(b)(1), unless the authorization is terminated or revoked.  Performed at [facility], [address], [place], [place] [address]      Labs: BNP (last 3 results) Recent Labs    11/23/23 0639  BNP 101.0*   Basic Metabolic Panel: Recent Labs  Lab 11/23/23 0639 11/24/23 0244  NA 140 141  K 3.4* 3.2*  CL 104 106  CO2 23 24  GLUCOSE 288* 138*  BUN 8 5*  CREATININE 1.25* 0.95  CALCIUM 7.6* 7.4*  MG  --  1.0*   Liver Function Tests: Recent Labs  Lab 11/23/23 0639 11/24/23 0244  AST 38 29  ALT 13 13  ALKPHOS 278* 223*  BILITOT 0.7 0.5  PROT 8.4* 7.2  ALBUMIN 3.0* 2.6*   No results for input(s): "LIPASE", "AMYLASE" in the  last 168 hours. No results for input(s): "AMMONIA" in the last 168 hours. CBC: Recent Labs  Lab 11/23/23 0639 11/24/23 0244  WBC 17.8* 14.4*  NEUTROABS 11.5*  --   HGB 11.8* 10.5*  HCT 38.2 35.0*  MCV 104.9* 106.4*  PLT 534* 419*   Cardiac Enzymes: No results for input(s): "CKTOTAL", "CKMB", "CKMBINDEX", "TROPONINI" in the last 168 hours. BNP: Invalid input(s): "POCBNP" CBG: Recent Labs  Lab 11/23/23 2053 11/23/23 2348 11/24/23 0344 11/24/23 0832 11/24/23 1135  GLUCAP 129* 107* 151* 112* 173*   D-Dimer No results for input(s): "DDIMER" in the last 72 hours. Hgb A1c No results for input(s): "HGBA1C" in the last 72 hours. Lipid Profile No results for input(s): "CHOL", "HDL", "LDLCALC", "TRIG", "CHOLHDL", "LDLDIRECT" in the last 72 hours. Thyroid function studies Recent Labs    11/23/23 0715  TSH 0.671   Anemia work up No results for input(s): "VITAMINB12", "FOLATE", "FERRITIN", "TIBC", "IRON", "RETICCTPCT" in the last 72 hours. Urinalysis    Component Value Date/Time   COLORURINE YELLOW 11/23/2023 0911   APPEARANCEUR HAZY (A) 11/23/2023 0911   LABSPEC 1.024 11/23/2023 0911   PHURINE 5.0 11/23/2023 0911   GLUCOSEU NEGATIVE 11/23/2023 0911   HGBUR NEGATIVE 11/23/2023 0911   BILIRUBINUR NEGATIVE 11/23/2023 0911   KETONESUR NEGATIVE 11/23/2023 0911   PROTEINUR 30 (A) 11/23/2023 0911   UROBILINOGEN 1.0 07/16/2014 0244   NITRITE NEGATIVE 11/23/2023 0911   LEUKOCYTESUR NEGATIVE 11/23/2023 0911   Sepsis Labs Recent Labs  Lab 11/23/23 0639 11/24/23 0244  WBC 17.8* 14.4*   Microbiology Recent Results (from the past 240 hours)  Blood Culture (routine x 2)     Status: None (Preliminary result)   Collection Time: 11/23/23  7:12 AM   Specimen: BLOOD  Result Value Ref Range Status   Specimen Description BLOOD LEFT ANTECUBITAL  Final   Special Requests   Final    BOTTLES DRAWN AEROBIC AND ANAEROBIC Blood Culture adequate volume   Culture   Final    NO GROWTH <  24 HOURS Performed at [facility] [address], [place], [place] [address]    Report Status PENDING  Incomplete  Blood Culture (routine x 2)     Status: None (Preliminary result)   Collection Time: 11/23/23  7:12 AM   Specimen: BLOOD  Result Value Ref Range Status   Specimen Description BLOOD RIGHT ANTECUBITAL  Final   Special Requests   Final    BOTTLES DRAWN AEROBIC AND ANAEROBIC Blood Culture adequate volume   Culture   Final    NO  GROWTH < 24 HOURS Performed at [facility], [address], [place], [place] [address]    Report Status PENDING  Incomplete  Resp panel by RT-PCR (RSV, Flu A&B, Covid) Urine, Clean Catch     Status: None   Collection Time: 11/23/23  9:11 AM   Specimen: Urine, Clean Catch; Nasal Swab  Result Value Ref Range Status   SARS Coronavirus 2 by RT PCR NEGATIVE NEGATIVE Final    Comment: (NOTE) SARS-CoV-2 target nucleic acids are NOT DETECTED.  The SARS-CoV-2 RNA is generally detectable in upper respiratory specimens during the acute phase of infection. The lowest concentration of SARS-CoV-2 viral copies this assay can detect is 138 copies/mL. A negative result does not preclude SARS-Cov-2 infection and should not be used as the sole basis for treatment or other patient management decisions. A negative result may occur with  improper specimen collection/handling, submission of specimen other than nasopharyngeal swab, presence of viral mutation(s) within the areas targeted by this assay, and inadequate number of viral copies(<138 copies/mL). A negative result must be combined with clinical observations, patient history, and epidemiological information. The expected result is Negative.  Fact Sheet for Patients:  BloggerCourse.com  Fact Sheet for Healthcare Providers:  SeriousBroker.it  This test is no t yet approved or cleared by the [place] FDA and  has been authorized for detection and/or  diagnosis of SARS-CoV-2 by FDA under an Emergency Use Authorization (EUA). This EUA will remain  in effect (meaning this test can be used) for the duration of the COVID-19 declaration under Section 564(b)(1) of the Act, 21 U.S.C.section 360bbb-3(b)(1), unless the authorization is terminated  or revoked sooner.       Influenza A by PCR NEGATIVE NEGATIVE Final   Influenza B by PCR NEGATIVE NEGATIVE Final    Comment: (NOTE) The Xpert Xpress SARS-CoV-2/FLU/RSV plus assay is intended as an aid in the diagnosis of influenza from Nasopharyngeal swab specimens and should not be used as a sole basis for treatment. Nasal washings and aspirates are unacceptable for Xpert Xpress SARS-CoV-2/FLU/RSV testing.  Fact Sheet for Patients: BloggerCourse.com  Fact Sheet for Healthcare Providers: SeriousBroker.it  This test is not yet approved or cleared by the [place] FDA and has been authorized for detection and/or diagnosis of SARS-CoV-2 by FDA under an Emergency Use Authorization (EUA). This EUA will remain in effect (meaning this test can be used) for the duration of the COVID-19 declaration under Section 564(b)(1) of the Act, 21 U.S.C. section 360bbb-3(b)(1), unless the authorization is terminated or revoked.     Resp Syncytial Virus by PCR NEGATIVE NEGATIVE Final    Comment: (NOTE) Fact Sheet for Patients: BloggerCourse.com  Fact Sheet for Healthcare Providers: SeriousBroker.it  This test is not yet approved or cleared by the [place] FDA and has been authorized for detection and/or diagnosis of SARS-CoV-2 by FDA under an Emergency Use Authorization (EUA). This EUA will remain in effect (meaning this test can be used) for the duration of the COVID-19 declaration under Section 564(b)(1) of the Act, 21 U.S.C. section 360bbb-3(b)(1), unless the authorization is terminated  or revoked.  Performed at [facility], [address] [place], [place] [address]      Time coordinating discharge: 35 minutes  SIGNED:   Erick Blinks, DO [facility] 11/24/2023, 1:37 PM  If 7PM-7AM, please contact night-coverage [email or web address]

## 2023-11-24 NOTE — Plan of Care (Signed)
  Problem: Education: Goal: Knowledge of General Education information will improve Description: Including pain rating scale, medication(s)/side effects and non-pharmacologic comfort measures Outcome: Progressing   Problem: Health Behavior/Discharge Planning: Goal: Ability to manage health-related needs will improve Outcome: Progressing   Problem: Clinical Measurements: Goal: Ability to maintain clinical measurements within normal limits will improve Outcome: Progressing Goal: Will remain free from infection Outcome: Progressing   Problem: Nutrition: Goal: Adequate nutrition will be maintained Outcome: Progressing   Problem: Coping: Goal: Level of anxiety will decrease Outcome: Progressing   Problem: Safety: Goal: Ability to remain free from injury will improve Outcome: Progressing   Problem: Skin Integrity: Goal: Risk for impaired skin integrity will decrease Outcome: Progressing   Problem: Education: Goal: Ability to describe self-care measures that may prevent or decrease complications (Diabetes Survival Skills Education) will improve Outcome: Progressing   Problem: Health Behavior/Discharge Planning: Goal: Ability to identify and utilize available resources and services will improve Outcome: Progressing

## 2023-11-28 LAB — CULTURE, BLOOD (ROUTINE X 2)
Culture: NO GROWTH
Culture: NO GROWTH
Special Requests: ADEQUATE
Special Requests: ADEQUATE

## 2023-11-28 NOTE — Progress Notes (Deleted)
 [name], female    DOB: [birth year]    MRN: [id number]   Brief patient profile:  44 yobf quit smoking 07/2017 and w/in year got started on inhalers  referred to pulmonary clinic in [facility]  10/01/2022 by [name]  for copd/ 02 dep   History of Present Illness  10/01/2022  Pulmonary/ 1st office eval/ [name] / [place] [facility] out of rehab since Jul 04 2022 on combivent prn and 02 prn as well  Chief Complaint  Patient presents with   Consult    Pt consult was admitted 9/8-9/27 for hypoxia, she is currently using 2L O2 PRN - she is having increased SOB.   Dyspnea:  cleaning house s 02  Cough: rattling but min mucoid production  Sleep: flat bed 2 pillows  SABA use: combivent but not using smi correctly  02: 2lpm hs/ sitting 2lpm  most time does not wear it walking  Rec Plan A = Automatic = Always=    Stiolto 2 pffs each am  Work on inhaler technique:  Plan B = Backup (to supplement plan A, not to replace it) Only use your albuterol-ipatropium inhaler as a rescue medication 0xygen 3lpm but ok to increase if needed to keep over 90% walking but remember to turn back down if sats well above 90% at rest.   Referred to cards for ex  cp/ sys chf by last echo > seen 07/13/23 and denied cp, doe and echo EF up to 50% so f/u yearly, no change in Rx     09/01/2023  f/u ov/[name] office/[name] re:  COPD/emphysema  Gold stage?  02 dep  maint on stiolto  pfts and ldsct not done as rec  Chief Complaint  Patient presents with   COPD   Shortness of Breath  Dyspnea:  room to room at home off 02 then sits down and uses 02 to recover  Cough: minimal > clear in am  Sleeping: bed is level 2 pillows no  resp cc  SABA use: used twice since last visit, doe not know upper  limit for prn use  02: 3lpm hs 24/7 x when walking  Rec Pantoprazole (protonix) 40 mg   Take  30-60 min before first meal of the day and Pepcid (famotidine)  20 mg after supper until return to office - this is the best way to  tell whether stomach acid is contributing to your problem.    If you continue to have a problem swallowing on the above and off all mint/ menthol and chocolate you will need to check with Dr [name] about a referral to GI doctor   Only use your albuterol as a rescue medication to be used if you can't catch your breath by resting or doing a relaxed purse lip breathing pattern.  - The less you use it, the better it will work when you need it. - Ok to use up to 2 puffs  every 4 hours if you must but call for immediate appointment if use goes up over your usual need - Don't leave home without it !!  (think of it like the spare tire for your car)   Work on inhaler technique:  relax and gently blow all the way out then take a nice smooth full deep breath back in, triggering the inhaler at same time you start breathing in.  Hold breath in for at least  5 seconds if you can.    Make sure you check your oxygen saturation  AT  your highest level of activity (not after you stop)   to be sure it stays over 90% and adjust  02 flow upward to maintain this level if needed but remember to turn it back to previous settings when you stop (to conserve your supply).   My office will be contacting you by phone for referral for pfts and lung cancer screen @  [phone]   - if you don't hear back from my office within one week please call us back or notify us thru MyChart and we'll address it right away.   Please schedule a follow up visit in 3 months but call sooner if needed  with all respiratory  medications /inhalers    11/30/2023  f/u ov/[name] office/[name] re: *** maint on ***  No chief complaint on file.   Dyspnea:  *** Cough: *** Sleeping: ***   resp cc  SABA use: *** 02: ***  Lung cancer screening: ***   No obvious day to day or daytime variability or assoc excess/ purulent sputum or mucus plugs or hemoptysis or cp or chest tightness, subjective wheeze or overt sinus or hb symptoms.    Also  denies any obvious fluctuation of symptoms with weather or environmental changes or other aggravating or alleviating factors except as outlined above   No unusual exposure hx or h/o childhood pna/ asthma or knowledge of premature birth.  Current Allergies, Complete Past Medical History, Past Surgical History, Family History, and Social History were reviewed in [employer] record.  ROS  The following are not active complaints unless bolded Hoarseness, sore throat, dysphagia, dental problems, itching, sneezing,  nasal congestion or discharge of excess mucus or purulent secretions, ear ache,   fever, chills, sweats, unintended wt loss or wt gain, classically pleuritic or exertional cp,  orthopnea pnd or arm/hand swelling  or leg swelling, presyncope, palpitations, abdominal pain, anorexia, nausea, vomiting, diarrhea  or change in bowel habits or change in bladder habits, change in stools or change in urine, dysuria, hematuria,  rash, arthralgias, visual complaints, headache, numbness, weakness or ataxia or problems with walking or coordination,  change in mood or  memory.        No outpatient medications have been marked as taking for the 11/30/23 encounter (Appointment) with April Cowden, MD.           Past Medical History:  Diagnosis Date   Anxiety    Blind    right   Colitis 09/07/2011   CVA (cerebral infarction)    GERD (gastroesophageal reflux disease)    Hyperlipidemia    Hypertension    Myocardial infarct (HCC)    1997, 2004   Severe major depression with psychotic features (HCC)    2004   Stroke [facility]) 2002       Objective:    Wts  11/30/2023        ***  09/01/2023     138   02/22/2023         134    11/16/22 112 lb 12.8 oz (51.2 kg)  10/01/22 108 lb 9.6 oz (49.3 kg)  06/15/22 111 lb 4.3 oz (50.5 kg)    Vital signs reviewed  11/30/2023  - Note at rest 02 sats  ***% on ***   General appearance:    ***    Mild barr***               Assessment

## 2023-11-30 ENCOUNTER — Encounter: Payer: Self-pay | Admitting: Internal Medicine

## 2023-11-30 ENCOUNTER — Ambulatory Visit: Payer: Medicare Other | Admitting: Internal Medicine

## 2023-11-30 ENCOUNTER — Encounter (INDEPENDENT_AMBULATORY_CARE_PROVIDER_SITE_OTHER): Payer: Self-pay | Admitting: *Deleted

## 2023-12-02 DIAGNOSIS — M545 Low back pain, unspecified: Secondary | ICD-10-CM | POA: Diagnosis not present

## 2023-12-02 DIAGNOSIS — Z9981 Dependence on supplemental oxygen: Secondary | ICD-10-CM | POA: Diagnosis not present

## 2023-12-02 DIAGNOSIS — M1990 Unspecified osteoarthritis, unspecified site: Secondary | ICD-10-CM | POA: Diagnosis not present

## 2023-12-02 DIAGNOSIS — N179 Acute kidney failure, unspecified: Secondary | ICD-10-CM | POA: Diagnosis not present

## 2023-12-02 DIAGNOSIS — R112 Nausea with vomiting, unspecified: Secondary | ICD-10-CM | POA: Diagnosis not present

## 2023-12-02 DIAGNOSIS — I10 Essential (primary) hypertension: Secondary | ICD-10-CM | POA: Diagnosis not present

## 2023-12-09 ENCOUNTER — Encounter: Payer: Self-pay | Admitting: Internal Medicine

## 2023-12-28 ENCOUNTER — Emergency Department (HOSPITAL_COMMUNITY)

## 2023-12-28 ENCOUNTER — Encounter (HOSPITAL_COMMUNITY): Payer: Self-pay | Admitting: Emergency Medicine

## 2023-12-28 ENCOUNTER — Emergency Department (HOSPITAL_COMMUNITY)
Admission: EM | Admit: 2023-12-28 | Discharge: 2023-12-29 | Disposition: A | Discharge status: Home / Self Care | Attending: Emergency Medicine | Admitting: Emergency Medicine

## 2023-12-28 ENCOUNTER — Other Ambulatory Visit: Payer: Self-pay

## 2023-12-28 DIAGNOSIS — Z7901 Long term (current) use of anticoagulants: Secondary | ICD-10-CM | POA: Insufficient documentation

## 2023-12-28 DIAGNOSIS — S2231XA Fracture of one rib, right side, initial encounter for closed fracture: Secondary | ICD-10-CM | POA: Diagnosis not present

## 2023-12-28 DIAGNOSIS — R109 Unspecified abdominal pain: Secondary | ICD-10-CM | POA: Diagnosis not present

## 2023-12-28 DIAGNOSIS — R000 Tachycardia, unspecified: Secondary | ICD-10-CM | POA: Diagnosis not present

## 2023-12-28 DIAGNOSIS — S2231XD Fracture of one rib, right side, subsequent encounter for fracture with routine healing: Secondary | ICD-10-CM | POA: Diagnosis not present

## 2023-12-28 DIAGNOSIS — S299XXA Unspecified injury of thorax, initial encounter: Secondary | ICD-10-CM | POA: Diagnosis present

## 2023-12-28 DIAGNOSIS — R531 Weakness: Secondary | ICD-10-CM | POA: Diagnosis not present

## 2023-12-28 DIAGNOSIS — R0781 Pleurodynia: Secondary | ICD-10-CM | POA: Diagnosis not present

## 2023-12-28 DIAGNOSIS — K7689 Other specified diseases of liver: Secondary | ICD-10-CM | POA: Diagnosis not present

## 2023-12-28 DIAGNOSIS — J439 Emphysema, unspecified: Secondary | ICD-10-CM | POA: Diagnosis not present

## 2023-12-28 DIAGNOSIS — W19XXXA Unspecified fall, initial encounter: Secondary | ICD-10-CM | POA: Diagnosis not present

## 2023-12-28 DIAGNOSIS — Z7982 Long term (current) use of aspirin: Secondary | ICD-10-CM | POA: Diagnosis not present

## 2023-12-28 LAB — CBG MONITORING, ED: Glucose-Capillary: 124 mg/dL — ABNORMAL HIGH (ref 70–99)

## 2023-12-28 MED ORDER — OXYCODONE-ACETAMINOPHEN 5-325 MG PO TABS
1.0000 | ORAL_TABLET | Freq: Once | ORAL | Status: AC
  Administered 2023-12-28: 1 via ORAL
  Filled 2023-12-28: qty 1

## 2023-12-28 MED ORDER — SODIUM CHLORIDE 0.9 % IV BOLUS
1000.0000 mL | Freq: Once | INTRAVENOUS | Status: AC
  Administered 2023-12-28: 1000 mL via INTRAVENOUS

## 2023-12-28 NOTE — ED Provider Notes (Signed)
 [place] [facility] AT [facility] Provider Note   CSN: [id number] Arrival date & time: 12/28/23  2120     History  Chief Complaint  Patient presents with   Fall   Flank Pain    [name] is a 73 y.o. female.  Patient is a 73 year old female presenting with complaints of right flank and back pain.  She apparently fell roughly 1 week ago while attempting to turn on the ceiling fan.  She has had discomfort since.  She also describes pain in her right leg when she attempts to ambulate.  No chest pain or difficulty breathing.  No abdominal pain.       Home Medications Prior to Admission medications   Medication Sig Start Date End Date Taking? Authorizing Provider  acetaminophen (TYLENOL) 650 MG CR tablet Take 1,300 mg by mouth every 8 (eight) hours as needed for pain.    [provider]  aspirin EC 81 MG tablet Take 81 mg by mouth daily. Swallow whole.    [provider]  citalopram (CELEXA) 20 MG tablet Take 20 mg by mouth daily. 11/01/23   [provider]  cloNIDine (CATAPRES) 0.3 MG tablet Take 0.3 mg by mouth 2 (two) times daily.    [provider]  clopidogrel (PLAVIX) 75 MG tablet Take 1 tablet (75 mg total) by mouth daily. 06/17/22   Leroy Sea, MD  folic acid (FOLVITE) 1 MG tablet Take 1 tablet (1 mg total) by mouth daily. 10/17/21   Catarina Hartshorn, MD  Ipratropium-Albuterol (COMBIVENT) 20-100 MCG/ACT AERS respimat Inhale 1 puff into the lungs every 6 (six) hours as needed for wheezing or shortness of breath. 05/09/22   Vassie Loll, MD  losartan (COZAAR) 25 MG tablet Take 12.5 mg by mouth daily. 10/08/23   [provider]  metoprolol succinate (TOPROL XL) 50 MG 24 hr tablet Take 1 tablet (50 mg total) by mouth daily. Take with or immediately following a meal. 03/30/23   Mallipeddi, Vishnu P, MD  mirtazapine (REMERON) 7.5 MG tablet Take 7.5 mg by mouth at bedtime. 04/27/22   [provider]   montelukast (SINGULAIR) 10 MG tablet Take 10 mg by mouth at bedtime.    [provider]  ondansetron (ZOFRAN) 4 MG tablet Take 1 tablet (4 mg total) by mouth every 8 (eight) hours as needed for nausea or vomiting. 11/24/23   Sherryll Burger, Pratik D, DO  pantoprazole (PROTONIX) 40 MG tablet Take 40 mg by mouth daily.    [provider]  STIOLTO RESPIMAT 2.5-2.5 MCG/ACT AERS SMARTSIG:2 Puff(s) Via Inhaler Daily 10/25/23   [provider]  torsemide (DEMADEX) 20 MG tablet Take 40 mg by mouth 2 (two) times daily.    [provider]  traMADol (ULTRAM) 50 MG tablet Take 50 mg by mouth 2 (two) times daily. 10/21/23   [provider]  vitamin B-12 (CYANOCOBALAMIN) 500 MCG tablet Take 1 tablet (500 mcg total) by mouth daily. 10/16/21   Catarina Hartshorn, MD      Allergies    Aspirin, Bee venom, Contrast media [iodinated contrast media], Lisinopril, Motrin [ibuprofen], Penicillins, and Dilaudid [hydromorphone hcl]    Review of Systems   Review of Systems  All other systems reviewed and are negative.   Physical Exam Updated Vital Signs BP (!) 117/45   Pulse (!) 113   Temp 98.6 F (37 C) (Oral)   Resp 18   Ht 5' (1.524 m)   Wt 59 kg  SpO2 97%   BMI 25.40 kg/m  Physical Exam Vitals and nursing note reviewed.  Constitutional:      General: She is not in acute distress.    Appearance: She is well-developed. She is not diaphoretic.  HENT:     Head: Normocephalic and atraumatic.  Cardiovascular:     Rate and Rhythm: Normal rate and regular rhythm.     Heart sounds: No murmur heard.    No friction rub. No gallop.  Pulmonary:     Effort: Pulmonary effort is normal. No respiratory distress.     Breath sounds: Normal breath sounds. No wheezing.  Abdominal:     General: Bowel sounds are normal. There is no distension.     Palpations: Abdomen is soft.     Tenderness: There is no abdominal tenderness.  Musculoskeletal:        General: Normal range of motion.      Cervical back: Normal range of motion and neck supple.     Comments: There is tenderness to palpation in the soft tissues of the right flank.  There is no redness, fluctuance, or erythema.  Skin:    General: Skin is warm and dry.  Neurological:     General: No focal deficit present.     Mental Status: She is alert and oriented to person, place, and time.     ED Results / Procedures / Treatments   Labs (all labs ordered are listed, but only abnormal results are displayed) Labs Reviewed  CBG MONITORING, ED - Abnormal; Notable for the following components:      Result Value   Glucose-Capillary 124 (*)    All other components within normal limits  CBC  MAGNESIUM  COMPREHENSIVE METABOLIC PANEL WITH GFR  TROPONIN I (HIGH SENSITIVITY)    EKG None  Radiology DG Chest 2 View Result Date: 12/28/2023 CLINICAL DATA:  Status post fall with right rib pain.  Weakness. EXAM: CHEST - 2 VIEW COMPARISON:  Radiograph and CT 11/23/2023 FINDINGS: Advanced emphysema. Stable heart size and mediastinal contours. No pneumothorax or large pleural effusion. No evidence of displaced rib fracture. No focal airspace disease. Chronic midthoracic compression deformities. IMPRESSION: Advanced emphysema. No acute radiographic findings. Electronically Signed   By: Narda Rutherford M.D.   On: 12/28/2023 22:55    Procedures Procedures  {Document cardiac monitor, telemetry assessment procedure when appropriate:1}  Medications Ordered in ED Medications  oxyCODONE-acetaminophen (PERCOCET/ROXICET) 5-325 MG per tablet 1 tablet (1 tablet Oral Given 12/28/23 2328)  sodium chloride 0.9 % bolus 1,000 mL (1,000 mLs Intravenous New Bag/Given 12/28/23 2348)    ED Course/ Medical Decision Making/ A&P   {   Click here for ABCD2, HEART and other calculatorsREFRESH Note before signing :1}                              Medical Decision Making  ***  {Document critical care time when appropriate:1} {Document review of labs and  clinical decision tools ie heart score, Chads2Vasc2 etc:1}  {Document your independent review of radiology images, and any outside records:1} {Document your discussion with family members, caretakers, and with consultants:1} {Document social determinants of health affecting pt's care:1} {Document your decision making why or why not admission, treatments were needed:1} Final Clinical Impression(s) / ED Diagnoses Final diagnoses:  None    Rx / DC Orders ED Discharge Orders     None

## 2023-12-28 NOTE — ED Notes (Signed)
 Pt gagged on the pill and spit it back up in emesis bag- says she can't swallow pills- Dr Judd Lien made aware.

## 2023-12-28 NOTE — ED Triage Notes (Signed)
 Pt arrives POV to ED c/o mechanical fall about 1 week ago. Pt states for the last few days she has had increased pain to her right ribs, right flank and right leg.   Pt states she recently had abscess surgically removed (10/13/23) from her right flank and is concerned that the area around her surgical scar is also swollen.

## 2023-12-29 LAB — CBC
HCT: 34 % — ABNORMAL LOW (ref 36.0–46.0)
Hemoglobin: 10.6 g/dL — ABNORMAL LOW (ref 12.0–15.0)
MCH: 32 pg (ref 26.0–34.0)
MCHC: 31.2 g/dL (ref 30.0–36.0)
MCV: 102.7 fL — ABNORMAL HIGH (ref 80.0–100.0)
Platelets: 355 10*3/uL (ref 150–400)
RBC: 3.31 MIL/uL — ABNORMAL LOW (ref 3.87–5.11)
RDW: 13.2 % (ref 11.5–15.5)
WBC: 12.2 10*3/uL — ABNORMAL HIGH (ref 4.0–10.5)
nRBC: 0 % (ref 0.0–0.2)

## 2023-12-29 LAB — COMPREHENSIVE METABOLIC PANEL WITH GFR
ALT: 14 U/L (ref 0–44)
AST: 45 U/L — ABNORMAL HIGH (ref 15–41)
Albumin: 2.8 g/dL — ABNORMAL LOW (ref 3.5–5.0)
Alkaline Phosphatase: 201 U/L — ABNORMAL HIGH (ref 38–126)
Anion gap: 19 — ABNORMAL HIGH (ref 5–15)
BUN: 23 mg/dL (ref 8–23)
CO2: 26 mmol/L (ref 22–32)
Calcium: 7 mg/dL — ABNORMAL LOW (ref 8.9–10.3)
Chloride: 92 mmol/L — ABNORMAL LOW (ref 98–111)
Creatinine, Ser: 1.47 mg/dL — ABNORMAL HIGH (ref 0.44–1.00)
GFR, Estimated: 37 mL/min — ABNORMAL LOW (ref >60)
Glucose, Bld: 132 mg/dL — ABNORMAL HIGH (ref 70–99)
Potassium: 3.5 mmol/L (ref 3.5–5.1)
Sodium: 137 mmol/L (ref 135–145)
Total Bilirubin: 1.5 mg/dL — ABNORMAL HIGH (ref 0.0–1.2)
Total Protein: 8.2 g/dL — ABNORMAL HIGH (ref 6.5–8.1)

## 2023-12-29 LAB — MAGNESIUM: Magnesium: 1 mg/dL — ABNORMAL LOW (ref 1.7–2.4)

## 2023-12-29 LAB — TROPONIN I (HIGH SENSITIVITY)
Troponin I (High Sensitivity): 18 ng/L — ABNORMAL HIGH (ref <18)
Troponin I (High Sensitivity): 23 ng/L — ABNORMAL HIGH (ref <18)

## 2023-12-29 MED ORDER — OXYCODONE-ACETAMINOPHEN 5-325 MG PO TABS
1.0000 | ORAL_TABLET | ORAL | 0 refills | Status: DC | PRN
Start: 2023-12-29 — End: 2024-05-11

## 2023-12-29 MED ORDER — MORPHINE SULFATE (PF) 4 MG/ML IV SOLN
4.0000 mg | Freq: Once | INTRAVENOUS | Status: AC
  Administered 2023-12-29: 4 mg via INTRAVENOUS
  Filled 2023-12-29: qty 1

## 2023-12-29 NOTE — ED Notes (Signed)
 Pt given water, peanut butter and crackers per request.

## 2023-12-29 NOTE — Discharge Instructions (Signed)
 Begin taking Percocet as prescribed as needed for pain.  Follow-up with primary doctor if not improving in the next week.

## 2024-01-02 DIAGNOSIS — J449 Chronic obstructive pulmonary disease, unspecified: Secondary | ICD-10-CM | POA: Diagnosis not present

## 2024-01-02 DIAGNOSIS — I10 Essential (primary) hypertension: Secondary | ICD-10-CM | POA: Diagnosis not present

## 2024-01-06 ENCOUNTER — Emergency Department (HOSPITAL_COMMUNITY)

## 2024-01-06 ENCOUNTER — Inpatient Hospital Stay (HOSPITAL_COMMUNITY)
Admission: EM | Admit: 2024-01-06 | Discharge: 2024-01-14 | DRG: 391 | Disposition: A | Discharge status: Home Health | Attending: Family Medicine | Admitting: Family Medicine

## 2024-01-06 ENCOUNTER — Other Ambulatory Visit: Payer: Self-pay

## 2024-01-06 DIAGNOSIS — B957 Other staphylococcus as the cause of diseases classified elsewhere: Secondary | ICD-10-CM | POA: Diagnosis present

## 2024-01-06 DIAGNOSIS — K219 Gastro-esophageal reflux disease without esophagitis: Secondary | ICD-10-CM | POA: Diagnosis present

## 2024-01-06 DIAGNOSIS — R112 Nausea with vomiting, unspecified: Secondary | ICD-10-CM | POA: Diagnosis not present

## 2024-01-06 DIAGNOSIS — I951 Orthostatic hypotension: Secondary | ICD-10-CM | POA: Diagnosis not present

## 2024-01-06 DIAGNOSIS — F32A Depression, unspecified: Secondary | ICD-10-CM | POA: Diagnosis present

## 2024-01-06 DIAGNOSIS — Z91041 Radiographic dye allergy status: Secondary | ICD-10-CM

## 2024-01-06 DIAGNOSIS — Z888 Allergy status to other drugs, medicaments and biological substances status: Secondary | ICD-10-CM

## 2024-01-06 DIAGNOSIS — R0989 Other specified symptoms and signs involving the circulatory and respiratory systems: Secondary | ICD-10-CM | POA: Diagnosis not present

## 2024-01-06 DIAGNOSIS — R54 Age-related physical debility: Secondary | ICD-10-CM | POA: Diagnosis present

## 2024-01-06 DIAGNOSIS — J9611 Chronic respiratory failure with hypoxia: Secondary | ICD-10-CM | POA: Diagnosis present

## 2024-01-06 DIAGNOSIS — E876 Hypokalemia: Secondary | ICD-10-CM | POA: Diagnosis present

## 2024-01-06 DIAGNOSIS — I2510 Atherosclerotic heart disease of native coronary artery without angina pectoris: Secondary | ICD-10-CM | POA: Diagnosis present

## 2024-01-06 DIAGNOSIS — I10 Essential (primary) hypertension: Secondary | ICD-10-CM | POA: Diagnosis present

## 2024-01-06 DIAGNOSIS — Z955 Presence of coronary angioplasty implant and graft: Secondary | ICD-10-CM

## 2024-01-06 DIAGNOSIS — Z8249 Family history of ischemic heart disease and other diseases of the circulatory system: Secondary | ICD-10-CM | POA: Diagnosis not present

## 2024-01-06 DIAGNOSIS — Z7982 Long term (current) use of aspirin: Secondary | ICD-10-CM

## 2024-01-06 DIAGNOSIS — R079 Chest pain, unspecified: Secondary | ICD-10-CM | POA: Diagnosis not present

## 2024-01-06 DIAGNOSIS — R109 Unspecified abdominal pain: Secondary | ICD-10-CM | POA: Diagnosis not present

## 2024-01-06 DIAGNOSIS — E860 Dehydration: Secondary | ICD-10-CM | POA: Diagnosis present

## 2024-01-06 DIAGNOSIS — F419 Anxiety disorder, unspecified: Secondary | ICD-10-CM | POA: Diagnosis present

## 2024-01-06 DIAGNOSIS — R000 Tachycardia, unspecified: Secondary | ICD-10-CM | POA: Diagnosis not present

## 2024-01-06 DIAGNOSIS — I130 Hypertensive heart and chronic kidney disease with heart failure and stage 1 through stage 4 chronic kidney disease, or unspecified chronic kidney disease: Secondary | ICD-10-CM | POA: Diagnosis present

## 2024-01-06 DIAGNOSIS — L8990 Pressure ulcer of unspecified site, unspecified stage: Secondary | ICD-10-CM | POA: Diagnosis present

## 2024-01-06 DIAGNOSIS — K529 Noninfective gastroenteritis and colitis, unspecified: Principal | ICD-10-CM | POA: Diagnosis present

## 2024-01-06 DIAGNOSIS — E785 Hyperlipidemia, unspecified: Secondary | ICD-10-CM | POA: Diagnosis present

## 2024-01-06 DIAGNOSIS — Z8673 Personal history of transient ischemic attack (TIA), and cerebral infarction without residual deficits: Secondary | ICD-10-CM

## 2024-01-06 DIAGNOSIS — J441 Chronic obstructive pulmonary disease with (acute) exacerbation: Secondary | ICD-10-CM | POA: Diagnosis not present

## 2024-01-06 DIAGNOSIS — Z886 Allergy status to analgesic agent status: Secondary | ICD-10-CM

## 2024-01-06 DIAGNOSIS — J9621 Acute and chronic respiratory failure with hypoxia: Secondary | ICD-10-CM | POA: Diagnosis not present

## 2024-01-06 DIAGNOSIS — H5461 Unqualified visual loss, right eye, normal vision left eye: Secondary | ICD-10-CM | POA: Diagnosis present

## 2024-01-06 DIAGNOSIS — R0609 Other forms of dyspnea: Secondary | ICD-10-CM | POA: Diagnosis not present

## 2024-01-06 DIAGNOSIS — N1831 Chronic kidney disease, stage 3a: Secondary | ICD-10-CM | POA: Diagnosis not present

## 2024-01-06 DIAGNOSIS — I5022 Chronic systolic (congestive) heart failure: Secondary | ICD-10-CM | POA: Diagnosis not present

## 2024-01-06 DIAGNOSIS — J811 Chronic pulmonary edema: Secondary | ICD-10-CM | POA: Diagnosis not present

## 2024-01-06 DIAGNOSIS — J810 Acute pulmonary edema: Secondary | ICD-10-CM | POA: Diagnosis not present

## 2024-01-06 DIAGNOSIS — R0602 Shortness of breath: Secondary | ICD-10-CM | POA: Diagnosis not present

## 2024-01-06 DIAGNOSIS — I252 Old myocardial infarction: Secondary | ICD-10-CM | POA: Diagnosis not present

## 2024-01-06 DIAGNOSIS — Z7902 Long term (current) use of antithrombotics/antiplatelets: Secondary | ICD-10-CM

## 2024-01-06 DIAGNOSIS — N281 Cyst of kidney, acquired: Secondary | ICD-10-CM | POA: Diagnosis not present

## 2024-01-06 DIAGNOSIS — D631 Anemia in chronic kidney disease: Secondary | ICD-10-CM | POA: Diagnosis not present

## 2024-01-06 DIAGNOSIS — S2241XA Multiple fractures of ribs, right side, initial encounter for closed fracture: Secondary | ICD-10-CM | POA: Diagnosis not present

## 2024-01-06 DIAGNOSIS — E872 Acidosis, unspecified: Secondary | ICD-10-CM | POA: Diagnosis not present

## 2024-01-06 DIAGNOSIS — Z91030 Bee allergy status: Secondary | ICD-10-CM

## 2024-01-06 DIAGNOSIS — Z79891 Long term (current) use of opiate analgesic: Secondary | ICD-10-CM

## 2024-01-06 DIAGNOSIS — J439 Emphysema, unspecified: Secondary | ICD-10-CM | POA: Diagnosis not present

## 2024-01-06 DIAGNOSIS — Z87891 Personal history of nicotine dependence: Secondary | ICD-10-CM | POA: Diagnosis not present

## 2024-01-06 DIAGNOSIS — Z1152 Encounter for screening for COVID-19: Secondary | ICD-10-CM

## 2024-01-06 DIAGNOSIS — Z885 Allergy status to narcotic agent status: Secondary | ICD-10-CM

## 2024-01-06 DIAGNOSIS — Z79899 Other long term (current) drug therapy: Secondary | ICD-10-CM

## 2024-01-06 DIAGNOSIS — Z880 Allergy status to penicillin: Secondary | ICD-10-CM

## 2024-01-06 DIAGNOSIS — Z90710 Acquired absence of both cervix and uterus: Secondary | ICD-10-CM

## 2024-01-06 LAB — COMPREHENSIVE METABOLIC PANEL WITH GFR
ALT: 32 U/L (ref 0–44)
AST: 100 U/L — ABNORMAL HIGH (ref 15–41)
Albumin: 2.5 g/dL — ABNORMAL LOW (ref 3.5–5.0)
Alkaline Phosphatase: 153 U/L — ABNORMAL HIGH (ref 38–126)
Anion gap: 20 — ABNORMAL HIGH (ref 5–15)
BUN: 22 mg/dL (ref 8–23)
CO2: 27 mmol/L (ref 22–32)
Calcium: 6.3 mg/dL — CL (ref 8.9–10.3)
Chloride: 87 mmol/L — ABNORMAL LOW (ref 98–111)
Creatinine, Ser: 1.43 mg/dL — ABNORMAL HIGH (ref 0.44–1.00)
GFR, Estimated: 38 mL/min — ABNORMAL LOW (ref >60)
Glucose, Bld: 261 mg/dL — ABNORMAL HIGH (ref 70–99)
Potassium: 2.4 mmol/L — CL (ref 3.5–5.1)
Sodium: 134 mmol/L — ABNORMAL LOW (ref 135–145)
Total Bilirubin: 0.9 mg/dL (ref 0.0–1.2)
Total Protein: 7.7 g/dL (ref 6.5–8.1)

## 2024-01-06 LAB — CBC
HCT: 28.4 % — ABNORMAL LOW (ref 36.0–46.0)
Hemoglobin: 8.8 g/dL — ABNORMAL LOW (ref 12.0–15.0)
MCH: 32 pg (ref 26.0–34.0)
MCHC: 31 g/dL (ref 30.0–36.0)
MCV: 103.3 fL — ABNORMAL HIGH (ref 80.0–100.0)
Platelets: 406 10*3/uL — ABNORMAL HIGH (ref 150–400)
RBC: 2.75 MIL/uL — ABNORMAL LOW (ref 3.87–5.11)
RDW: 13.3 % (ref 11.5–15.5)
WBC: 12.1 10*3/uL — ABNORMAL HIGH (ref 4.0–10.5)
nRBC: 0 % (ref 0.0–0.2)

## 2024-01-06 LAB — RESP PANEL BY RT-PCR (RSV, FLU A&B, COVID)  RVPGX2
Influenza A by PCR: NEGATIVE
Influenza B by PCR: NEGATIVE
Resp Syncytial Virus by PCR: NEGATIVE
SARS Coronavirus 2 by RT PCR: NEGATIVE

## 2024-01-06 LAB — LACTIC ACID, PLASMA
Lactic Acid, Venous: 5.6 mmol/L (ref 0.5–1.9)
Lactic Acid, Venous: 5.8 mmol/L (ref 0.5–1.9)

## 2024-01-06 LAB — LIPASE, BLOOD: Lipase: 34 U/L (ref 11–51)

## 2024-01-06 LAB — PROTIME-INR
INR: 1.2 (ref 0.8–1.2)
Prothrombin Time: 15.3 s — ABNORMAL HIGH (ref 11.4–15.2)

## 2024-01-06 MED ORDER — LACTATED RINGERS IV BOLUS
1000.0000 mL | Freq: Once | INTRAVENOUS | Status: AC
  Administered 2024-01-06: 1000 mL via INTRAVENOUS

## 2024-01-06 MED ORDER — MORPHINE SULFATE (PF) 4 MG/ML IV SOLN
4.0000 mg | Freq: Once | INTRAVENOUS | Status: AC
  Administered 2024-01-06: 4 mg via INTRAVENOUS
  Filled 2024-01-06: qty 1

## 2024-01-06 MED ORDER — DIPHENHYDRAMINE HCL 25 MG PO CAPS: 50.0000 mg | ORAL_CAPSULE | Freq: Once | ORAL | Status: AC

## 2024-01-06 MED ORDER — SODIUM CHLORIDE 0.9 % IV SOLN
2.0000 g | Freq: Once | INTRAVENOUS | Status: AC
  Administered 2024-01-06: 2 g via INTRAVENOUS
  Filled 2024-01-06: qty 12.5

## 2024-01-06 MED ORDER — METHYLPREDNISOLONE SODIUM SUCC 40 MG IJ SOLR
40.0000 mg | Freq: Once | INTRAMUSCULAR | Status: AC
  Administered 2024-01-06: 40 mg via INTRAVENOUS
  Filled 2024-01-06: qty 1

## 2024-01-06 MED ORDER — ONDANSETRON HCL 4 MG/2ML IJ SOLN
4.0000 mg | Freq: Once | INTRAMUSCULAR | Status: AC
  Administered 2024-01-06: 4 mg via INTRAVENOUS
  Filled 2024-01-06: qty 2

## 2024-01-06 MED ORDER — CALCIUM GLUCONATE-NACL 1-0.675 GM/50ML-% IV SOLN
1.0000 g | Freq: Once | INTRAVENOUS | Status: AC
  Administered 2024-01-06: 1000 mg via INTRAVENOUS
  Filled 2024-01-06: qty 50

## 2024-01-06 MED ORDER — METRONIDAZOLE 500 MG/100ML IV SOLN
500.0000 mg | Freq: Two times a day (BID) | INTRAVENOUS | Status: DC
  Administered 2024-01-06 – 2024-01-10 (×8): 500 mg via INTRAVENOUS
  Filled 2024-01-06 (×8): qty 100

## 2024-01-06 MED ORDER — DIPHENHYDRAMINE HCL 50 MG/ML IJ SOLN
50.0000 mg | Freq: Once | INTRAMUSCULAR | Status: AC
  Administered 2024-01-06: 50 mg via INTRAVENOUS
  Filled 2024-01-06: qty 1

## 2024-01-06 MED ORDER — POTASSIUM CHLORIDE 10 MEQ/100ML IV SOLN
10.0000 meq | INTRAVENOUS | Status: AC
  Administered 2024-01-06 – 2024-01-07 (×4): 10 meq via INTRAVENOUS
  Filled 2024-01-06 (×4): qty 100

## 2024-01-06 NOTE — ED Triage Notes (Signed)
 Pt reports she has had N/V x3 days, unable to keep anything down. Also c/o right sided flank pain.   HR 150 in triage

## 2024-01-06 NOTE — Sepsis Progress Note (Addendum)
 Elink monitoring for the code sepsis protocol.   2327: Notified provider of need to order 3rd lactic acid.
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
## 2024-01-06 NOTE — Consult Note (Signed)
 CODE SEPSIS - PHARMACY COMMUNICATION  **Broad Spectrum Antibiotics should be administered within 1 hour of Sepsis diagnosis**  Time Code Sepsis Called/Page Received: 2150  Antibiotics Ordered: cefepime, flagyl  Time of 1st antibiotic administration: 2233  Additional action taken by pharmacy: n/a  If necessary, Name of Provider/Nurse Contacted: n/a    Koty Anctil A Trevion Hoben ,PharmD Clinical Pharmacist  01/06/2024  9:53 PM

## 2024-01-07 ENCOUNTER — Other Ambulatory Visit: Payer: Self-pay

## 2024-01-07 ENCOUNTER — Encounter (HOSPITAL_COMMUNITY): Payer: Self-pay | Admitting: Internal Medicine

## 2024-01-07 DIAGNOSIS — J42 Unspecified chronic bronchitis: Secondary | ICD-10-CM | POA: Diagnosis not present

## 2024-01-07 DIAGNOSIS — Z1152 Encounter for screening for COVID-19: Secondary | ICD-10-CM | POA: Diagnosis not present

## 2024-01-07 DIAGNOSIS — E872 Acidosis, unspecified: Secondary | ICD-10-CM | POA: Diagnosis present

## 2024-01-07 DIAGNOSIS — I5022 Chronic systolic (congestive) heart failure: Secondary | ICD-10-CM | POA: Diagnosis not present

## 2024-01-07 DIAGNOSIS — N281 Cyst of kidney, acquired: Secondary | ICD-10-CM | POA: Diagnosis not present

## 2024-01-07 DIAGNOSIS — F419 Anxiety disorder, unspecified: Secondary | ICD-10-CM | POA: Diagnosis present

## 2024-01-07 DIAGNOSIS — R112 Nausea with vomiting, unspecified: Secondary | ICD-10-CM

## 2024-01-07 DIAGNOSIS — F32A Depression, unspecified: Secondary | ICD-10-CM | POA: Diagnosis present

## 2024-01-07 DIAGNOSIS — E860 Dehydration: Secondary | ICD-10-CM | POA: Diagnosis present

## 2024-01-07 DIAGNOSIS — R55 Syncope and collapse: Secondary | ICD-10-CM | POA: Diagnosis not present

## 2024-01-07 DIAGNOSIS — E785 Hyperlipidemia, unspecified: Secondary | ICD-10-CM | POA: Diagnosis present

## 2024-01-07 DIAGNOSIS — J9621 Acute and chronic respiratory failure with hypoxia: Secondary | ICD-10-CM | POA: Diagnosis not present

## 2024-01-07 DIAGNOSIS — I517 Cardiomegaly: Secondary | ICD-10-CM | POA: Diagnosis not present

## 2024-01-07 DIAGNOSIS — S2241XA Multiple fractures of ribs, right side, initial encounter for closed fracture: Secondary | ICD-10-CM | POA: Diagnosis not present

## 2024-01-07 DIAGNOSIS — K219 Gastro-esophageal reflux disease without esophagitis: Secondary | ICD-10-CM | POA: Diagnosis present

## 2024-01-07 DIAGNOSIS — N1831 Chronic kidney disease, stage 3a: Secondary | ICD-10-CM | POA: Diagnosis not present

## 2024-01-07 DIAGNOSIS — R0602 Shortness of breath: Secondary | ICD-10-CM | POA: Diagnosis not present

## 2024-01-07 DIAGNOSIS — R0609 Other forms of dyspnea: Secondary | ICD-10-CM | POA: Diagnosis not present

## 2024-01-07 DIAGNOSIS — Z955 Presence of coronary angioplasty implant and graft: Secondary | ICD-10-CM | POA: Diagnosis not present

## 2024-01-07 DIAGNOSIS — J439 Emphysema, unspecified: Secondary | ICD-10-CM | POA: Diagnosis not present

## 2024-01-07 DIAGNOSIS — I252 Old myocardial infarction: Secondary | ICD-10-CM | POA: Diagnosis not present

## 2024-01-07 DIAGNOSIS — K529 Noninfective gastroenteritis and colitis, unspecified: Secondary | ICD-10-CM | POA: Diagnosis not present

## 2024-01-07 DIAGNOSIS — D631 Anemia in chronic kidney disease: Secondary | ICD-10-CM | POA: Diagnosis not present

## 2024-01-07 DIAGNOSIS — I2510 Atherosclerotic heart disease of native coronary artery without angina pectoris: Secondary | ICD-10-CM | POA: Diagnosis present

## 2024-01-07 DIAGNOSIS — I130 Hypertensive heart and chronic kidney disease with heart failure and stage 1 through stage 4 chronic kidney disease, or unspecified chronic kidney disease: Secondary | ICD-10-CM | POA: Diagnosis not present

## 2024-01-07 DIAGNOSIS — J810 Acute pulmonary edema: Secondary | ICD-10-CM | POA: Diagnosis not present

## 2024-01-07 DIAGNOSIS — Z8249 Family history of ischemic heart disease and other diseases of the circulatory system: Secondary | ICD-10-CM | POA: Diagnosis not present

## 2024-01-07 DIAGNOSIS — I700 Atherosclerosis of aorta: Secondary | ICD-10-CM | POA: Diagnosis not present

## 2024-01-07 DIAGNOSIS — R109 Unspecified abdominal pain: Secondary | ICD-10-CM | POA: Diagnosis not present

## 2024-01-07 DIAGNOSIS — Z87891 Personal history of nicotine dependence: Secondary | ICD-10-CM | POA: Diagnosis not present

## 2024-01-07 DIAGNOSIS — J441 Chronic obstructive pulmonary disease with (acute) exacerbation: Secondary | ICD-10-CM | POA: Diagnosis not present

## 2024-01-07 DIAGNOSIS — E876 Hypokalemia: Secondary | ICD-10-CM | POA: Diagnosis present

## 2024-01-07 LAB — URINALYSIS, ROUTINE W REFLEX MICROSCOPIC
Bacteria, UA: NONE SEEN
Bilirubin Urine: NEGATIVE
Glucose, UA: NEGATIVE mg/dL
Hgb urine dipstick: NEGATIVE
Ketones, ur: NEGATIVE mg/dL
Leukocytes,Ua: NEGATIVE
Nitrite: NEGATIVE
Protein, ur: 30 mg/dL — AB
Specific Gravity, Urine: 1.013 (ref 1.005–1.030)
pH: 5 (ref 5.0–8.0)

## 2024-01-07 LAB — PHOSPHORUS
Phosphorus: 1.7 mg/dL — ABNORMAL LOW (ref 2.5–4.6)
Phosphorus: 1.7 mg/dL — ABNORMAL LOW (ref 2.5–4.6)

## 2024-01-07 LAB — MAGNESIUM
Magnesium: 0.7 mg/dL — CL (ref 1.7–2.4)
Magnesium: 0.8 mg/dL — CL (ref 1.7–2.4)

## 2024-01-07 LAB — LACTIC ACID, PLASMA
Lactic Acid, Venous: 3.8 mmol/L (ref 0.5–1.9)
Lactic Acid, Venous: 2.6 mmol/L (ref 0.5–1.9)
Lactic Acid, Venous: 3.7 mmol/L (ref 0.5–1.9)

## 2024-01-07 LAB — POTASSIUM: Potassium: 3.1 mmol/L — ABNORMAL LOW (ref 3.5–5.1)

## 2024-01-07 MED ORDER — CALCIUM GLUCONATE-NACL 2-0.675 GM/100ML-% IV SOLN: 2.0000 g | Freq: Once | INTRAVENOUS | Status: DC

## 2024-01-07 MED ORDER — POTASSIUM CHLORIDE 2 MEQ/ML IV SOLN
INTRAVENOUS | Status: AC
  Filled 2024-01-07: qty 1000

## 2024-01-07 MED ORDER — OXYCODONE HCL 5 MG PO TABS
5.0000 mg | ORAL_TABLET | ORAL | Status: DC | PRN
  Administered 2024-01-08 – 2024-01-13 (×2): 5 mg via ORAL
  Filled 2024-01-07 (×2): qty 1

## 2024-01-07 MED ORDER — VITAMIN B-12 100 MCG PO TABS
500.0000 ug | ORAL_TABLET | Freq: Every day | ORAL | Status: DC
  Administered 2024-01-08 – 2024-01-14 (×7): 500 ug via ORAL
  Filled 2024-01-07 (×8): qty 5

## 2024-01-07 MED ORDER — HYDROMORPHONE HCL 1 MG/ML IJ SOLN
0.5000 mg | INTRAMUSCULAR | Status: DC | PRN
  Administered 2024-01-08 – 2024-01-13 (×8): 1 mg via INTRAVENOUS
  Filled 2024-01-07 (×9): qty 1

## 2024-01-07 MED ORDER — CLOPIDOGREL BISULFATE 75 MG PO TABS
75.0000 mg | ORAL_TABLET | Freq: Every day | ORAL | Status: DC
  Administered 2024-01-07 – 2024-01-14 (×8): 75 mg via ORAL
  Filled 2024-01-07 (×8): qty 1

## 2024-01-07 MED ORDER — METOPROLOL SUCCINATE ER 50 MG PO TB24
50.0000 mg | ORAL_TABLET | Freq: Every day | ORAL | Status: DC
  Administered 2024-01-07 – 2024-01-10 (×4): 50 mg via ORAL
  Filled 2024-01-07: qty 1
  Filled 2024-01-07 (×2): qty 2
  Filled 2024-01-07: qty 1

## 2024-01-07 MED ORDER — CLONIDINE HCL 0.2 MG PO TABS
0.3000 mg | ORAL_TABLET | Freq: Two times a day (BID) | ORAL | Status: DC
  Administered 2024-01-07 – 2024-01-10 (×7): 0.3 mg via ORAL
  Filled 2024-01-07 (×7): qty 1

## 2024-01-07 MED ORDER — ONDANSETRON HCL 4 MG/2ML IJ SOLN: 4.0000 mg | Freq: Four times a day (QID) | INTRAMUSCULAR | Status: DC | PRN

## 2024-01-07 MED ORDER — ACETAMINOPHEN 325 MG PO TABS
650.0000 mg | ORAL_TABLET | Freq: Four times a day (QID) | ORAL | Status: DC | PRN
  Filled 2024-01-07: qty 2

## 2024-01-07 MED ORDER — SODIUM CHLORIDE 0.9% FLUSH: 3.0000 mL | Freq: Two times a day (BID) | INTRAVENOUS | Status: DC

## 2024-01-07 MED ORDER — UMECLIDINIUM BROMIDE 62.5 MCG/ACT IN AEPB
1.0000 | INHALATION_SPRAY | Freq: Every day | RESPIRATORY_TRACT | Status: DC
  Administered 2024-01-08 – 2024-01-14 (×7): 1 via RESPIRATORY_TRACT
  Filled 2024-01-07 (×2): qty 7

## 2024-01-07 MED ORDER — LOSARTAN POTASSIUM 25 MG PO TABS
12.5000 mg | ORAL_TABLET | Freq: Every day | ORAL | Status: DC
  Administered 2024-01-08 – 2024-01-10 (×3): 12.5 mg via ORAL
  Filled 2024-01-07 (×4): qty 1

## 2024-01-07 MED ORDER — CALCIUM GLUCONATE-NACL 1-0.675 GM/50ML-% IV SOLN
1.0000 g | INTRAVENOUS | Status: AC
Start: 2024-01-07 — End: 2024-01-07
  Administered 2024-01-07 (×2): 1000 mg via INTRAVENOUS
  Filled 2024-01-07: qty 50

## 2024-01-07 MED ORDER — LACTATED RINGERS IV SOLN: INTRAVENOUS | Status: DC

## 2024-01-07 MED ORDER — MAGNESIUM SULFATE 2 GM/50ML IV SOLN
2.0000 g | Freq: Once | INTRAVENOUS | Status: AC
  Administered 2024-01-07: 2 g via INTRAVENOUS
  Filled 2024-01-07: qty 50

## 2024-01-07 MED ORDER — FLEET ENEMA RE ENEM: 1.0000 | ENEMA | Freq: Once | RECTAL | Status: DC | PRN

## 2024-01-07 MED ORDER — ARFORMOTEROL TARTRATE 15 MCG/2ML IN NEBU
15.0000 ug | INHALATION_SOLUTION | Freq: Two times a day (BID) | RESPIRATORY_TRACT | Status: DC
  Administered 2024-01-07 – 2024-01-14 (×15): 15 ug via RESPIRATORY_TRACT
  Filled 2024-01-07 (×15): qty 2

## 2024-01-07 MED ORDER — ASPIRIN 81 MG PO TBEC
81.0000 mg | DELAYED_RELEASE_TABLET | Freq: Every day | ORAL | Status: DC
  Administered 2024-01-08 – 2024-01-13 (×6): 81 mg via ORAL
  Filled 2024-01-07 (×6): qty 1

## 2024-01-07 MED ORDER — ONDANSETRON HCL 4 MG PO TABS: 4.0000 mg | ORAL_TABLET | Freq: Four times a day (QID) | ORAL | Status: DC | PRN

## 2024-01-07 MED ORDER — MONTELUKAST SODIUM 10 MG PO TABS
10.0000 mg | ORAL_TABLET | Freq: Every day | ORAL | Status: DC
  Administered 2024-01-07 – 2024-01-13 (×7): 10 mg via ORAL
  Filled 2024-01-07 (×7): qty 1

## 2024-01-07 MED ORDER — DIPHENHYDRAMINE HCL 50 MG/ML IJ SOLN
INTRAMUSCULAR | Status: AC
  Administered 2024-01-07: 50 mg via INTRAVENOUS
  Filled 2024-01-07: qty 1

## 2024-01-07 MED ORDER — CITALOPRAM HYDROBROMIDE 20 MG PO TABS
20.0000 mg | ORAL_TABLET | Freq: Every day | ORAL | Status: DC
  Administered 2024-01-07 – 2024-01-14 (×8): 20 mg via ORAL
  Filled 2024-01-07 (×8): qty 1

## 2024-01-07 MED ORDER — SODIUM CHLORIDE 0.9% FLUSH: 3.0000 mL | INTRAVENOUS | Status: DC | PRN

## 2024-01-07 MED ORDER — TRAZODONE HCL 50 MG PO TABS
25.0000 mg | ORAL_TABLET | Freq: Every evening | ORAL | Status: DC | PRN
  Filled 2024-01-07: qty 1

## 2024-01-07 MED ORDER — HYDRALAZINE HCL 20 MG/ML IJ SOLN: 10.0000 mg | INTRAMUSCULAR | Status: DC | PRN

## 2024-01-07 MED ORDER — HEPARIN SODIUM (PORCINE) 5000 UNIT/ML IJ SOLN
5000.0000 [IU] | Freq: Three times a day (TID) | INTRAMUSCULAR | Status: DC
  Administered 2024-01-07 – 2024-01-14 (×21): 5000 [IU] via SUBCUTANEOUS
  Filled 2024-01-07 (×20): qty 1

## 2024-01-07 MED ORDER — PANTOPRAZOLE SODIUM 40 MG PO TBEC
40.0000 mg | DELAYED_RELEASE_TABLET | Freq: Every day | ORAL | Status: DC
  Administered 2024-01-07: 40 mg via ORAL
  Filled 2024-01-07 (×2): qty 1

## 2024-01-07 MED ORDER — LACTATED RINGERS IV BOLUS
1000.0000 mL | Freq: Once | INTRAVENOUS | Status: AC
  Administered 2024-01-07: 1000 mL via INTRAVENOUS

## 2024-01-07 MED ORDER — FOLIC ACID 1 MG PO TABS
1.0000 mg | ORAL_TABLET | Freq: Every day | ORAL | Status: DC
  Administered 2024-01-08 – 2024-01-14 (×7): 1 mg via ORAL
  Filled 2024-01-07 (×8): qty 1

## 2024-01-07 MED ORDER — SENNOSIDES-DOCUSATE SODIUM 8.6-50 MG PO TABS: 1.0000 | ORAL_TABLET | Freq: Every evening | ORAL | Status: DC | PRN

## 2024-01-07 MED ORDER — ACETAMINOPHEN 650 MG RE SUPP: 650.0000 mg | Freq: Four times a day (QID) | RECTAL | Status: DC | PRN

## 2024-01-07 MED ORDER — LEVALBUTEROL HCL 0.63 MG/3ML IN NEBU
0.6300 mg | INHALATION_SOLUTION | Freq: Four times a day (QID) | RESPIRATORY_TRACT | Status: DC | PRN
  Administered 2024-01-08 – 2024-01-10 (×5): 0.63 mg via RESPIRATORY_TRACT
  Filled 2024-01-07 (×5): qty 3

## 2024-01-07 MED ORDER — IOHEXOL 350 MG/ML SOLN
100.0000 mL | Freq: Once | INTRAVENOUS | Status: AC | PRN
  Administered 2024-01-07: 100 mL via INTRAVENOUS

## 2024-01-07 MED ORDER — SODIUM CHLORIDE 0.9% FLUSH
3.0000 mL | Freq: Two times a day (BID) | INTRAVENOUS | Status: DC
  Administered 2024-01-07 – 2024-01-14 (×14): 3 mL via INTRAVENOUS

## 2024-01-07 MED ORDER — MIRTAZAPINE 15 MG PO TABS
7.5000 mg | ORAL_TABLET | Freq: Every day | ORAL | Status: DC
  Administered 2024-01-07 – 2024-01-13 (×5): 7.5 mg via ORAL
  Filled 2024-01-07 (×5): qty 1

## 2024-01-07 MED ORDER — BISACODYL 5 MG PO TBEC: 5.0000 mg | DELAYED_RELEASE_TABLET | Freq: Every day | ORAL | Status: DC | PRN

## 2024-01-07 MED ORDER — IPRATROPIUM BROMIDE 0.02 % IN SOLN
0.5000 mg | Freq: Four times a day (QID) | RESPIRATORY_TRACT | Status: DC | PRN
  Administered 2024-01-09 (×2): 0.5 mg via RESPIRATORY_TRACT
  Filled 2024-01-07 (×2): qty 2.5

## 2024-01-07 NOTE — Assessment & Plan Note (Signed)
-   Nausea vomiting, but no diarrhea WBC 12.1, afebrile-  Patient received IV antibiotics, Flagyl  and cefepime  -Considering continuing Flagyl  for today Likely viral versus bacterial

## 2024-01-07 NOTE — Assessment & Plan Note (Signed)
-   Continue wound care, monitoring

## 2024-01-07 NOTE — Assessment & Plan Note (Signed)
-   History of CKD stage IIIa, Worsening kidney function from baseline Lab Results  Component Value Date   CREATININE 1.43 (H) 01/06/2024   CREATININE 1.47 (H) 12/28/2023   CREATININE 0.95 11/24/2023   -Continue IV fluids, avoiding nephrotoxins

## 2024-01-07 NOTE — Assessment & Plan Note (Signed)
-   Acute on chronic respiratory failure-due to multiple comorbidities including CHF -Currently stable satting 100% on 1 L

## 2024-01-07 NOTE — Evaluation (Signed)
 Occupational Therapy Evaluation Patient Details Name: [name] MRN: [id number] DOB: May 29, 1951 Today's Date: 01/07/2024   History of Present Illness   [name] is a 66 yoM PMH of hypertension, chronic systolic heart failure, depression, CVA, anxiety, and COPD with chronic hypoxemia on 3 L nasal cannula oxygen - who presents to the emergency department due to  N/V x3 days was unable to keep anything down. Also c/o right sided flank pain.      Similar complaint of hospitalization for intractable nausea and vomiting admitted 3/4 - 3/5     Clinical Impressions Pt agreeable to OT evaluation. Pt reports that she lives at home with her son. Son does work (5 mins away from the home) but is able to assist as needed. Pt reports that she is on 3L of O2 at home at baseline. Pt able to complete sit to stand t/f by pushing up from chair using arm rests. Pt able to march in place and take steps forwards and backwards holding OT hand. Educated pt on use of RW and encouraged her to use it. Son in room at end of session. Pt would benefit from continued OT while in acute care setting.      If plan is discharge home, recommend the following:   A little help with walking and/or transfers;A little help with bathing/dressing/bathroom;Assistance with cooking/housework;Help with stairs or ramp for entrance;Assist for transportation     Functional Status Assessment   Patient has had a recent decline in their functional status and demonstrates the ability to make significant improvements in function in a reasonable and predictable amount of time.     Equipment Recommendations   None recommended by OT      Precautions/Restrictions   Precautions Precautions: Fall Recall of Precautions/Restrictions: Intact Restrictions Weight Bearing Restrictions Per Provider Order: No     Mobility Bed Mobility Overal bed mobility:  (pt was in chair upon arrival)                   Transfers Overall transfer level: Modified independent Equipment used: None               General transfer comment: pt using arm rests of chair to stand      Balance Overall balance assessment: Modified Independent                                         ADL either performed or assessed with clinical judgement   ADL Overall ADL's : Needs assistance/impaired Eating/Feeding: Set up;Sitting   Grooming: Set up;Sitting   Upper Body Bathing: Sitting;Set up   Lower Body Bathing: Set up;Sitting/lateral leans   Upper Body Dressing : Set up;Sitting   Lower Body Dressing: Set up;Sitting/lateral leans   Toilet Transfer: Rolling walker (2 wheels);Supervision/safety   Toileting- Clothing Manipulation and Hygiene: Set up;Sitting/lateral lean   Tub/ Shower Transfer: Rolling walker (2 wheels);Minimal assistance   Functional mobility during ADLs: Supervision/safety;Rolling walker (2 wheels)       Vision Baseline Vision/History: 1 Wears glasses Ability to See in Adequate Light: 0 Adequate Patient Visual Report: No change from baseline Vision Assessment?: No apparent visual deficits     Perception Perception: Not tested       Praxis Praxis: Not tested       Pertinent Vitals/Pain Pain Assessment Pain Assessment: No/denies pain     Extremity/Trunk Assessment Upper Extremity Assessment  Upper Extremity Assessment: Overall WFL for tasks assessed   Lower Extremity Assessment Lower Extremity Assessment: Defer to PT evaluation   Cervical / Trunk Assessment Cervical / Trunk Assessment: Normal   Communication Communication Communication: No apparent difficulties   Cognition Arousal: Alert Behavior During Therapy: WFL for tasks assessed/performed Cognition: No apparent impairments                               Following commands: Intact       Cueing  General Comments   Cueing Techniques: Verbal cues      Exercises      Shoulder Instructions      Home Living Family/patient expects to be discharged to:: Private residence Living Arrangements: Children Available Help at Discharge: Family Type of Home: Mobile home Home Access: Stairs to enter Secretary/administrator of Steps: 5 Entrance Stairs-Rails: Can reach both Home Layout: One level     Bathroom Shower/Tub: Chief Strategy Officer: Standard Bathroom Accessibility: Yes How Accessible: Accessible via walker Home Equipment: Rolling Walker (2 wheels);BSC/3in1;Grab bars - toilet;Grab bars - tub/shower;Shower seat          Prior Functioning/Environment Prior Level of Function : Independent/Modified Independent             Mobility Comments: pt usign RW to ambulate since falling ADLs Comments: PLOF independent    OT Problem List: Decreased strength;Impaired balance (sitting and/or standing);Decreased activity tolerance   OT Treatment/Interventions: Self-care/ADL training;Therapeutic exercise;Therapeutic activities;DME and/or AE instruction;Patient/family education      OT Goals(Current goals can be found in the care plan section)   Acute Rehab OT Goals Patient Stated Goal: to go home OT Goal Formulation: With patient Time For Goal Achievement: 01/21/24 Potential to Achieve Goals: Good ADL Goals Pt Will Perform Lower Body Dressing: Independently Pt Will Transfer to Toilet: Independently Pt/caregiver will Perform Home Exercise Program: Increased strength;Both right and left upper extremity;With written HEP provided   OT Frequency:  Min 2X/week    Co-evaluation              AM-PAC OT "6 Clicks" Daily Activity     Outcome Measure Help from another person eating meals?: None Help from another person taking care of personal grooming?: None Help from another person toileting, which includes using toliet, bedpan, or urinal?: A Little Help from another person bathing (including washing, rinsing, drying)?: A  Little Help from another person to put on and taking off regular upper body clothing?: None Help from another person to put on and taking off regular lower body clothing?: None 6 Click Score: 22   End of Session Equipment Utilized During Treatment: Oxygen  Nurse Communication: Mobility status  Activity Tolerance: Patient tolerated treatment well Patient left: in chair;with call bell/phone within reach;with family/visitor present  OT Visit Diagnosis: Repeated falls (R29.6)                Time: 6045-4098 OT Time Calculation (min): 15 min Charges:  OT General Charges $OT Visit: 1 Visit OT Evaluation $OT Eval Low Complexity: 1 Low   [name], OTR/L 01/07/2024, 10:06 AM

## 2024-01-07 NOTE — Evaluation (Signed)
 Physical Therapy Evaluation Patient Details Name: [name] MRN: [id number] DOB: 27-Sep-1950 Today's Date: 01/07/2024  History of Present Illness  [name] is a 30 yoM PMH of hypertension, chronic systolic heart failure, depression, CVA, anxiety, and COPD with chronic hypoxemia on 3 L nasal cannula oxygen - who presents to the emergency department due to  N/V x3 days was unable to keep anything down. Also c/o right sided flank pain. Similar complaint of hospitalization for intractable nausea and vomiting admitted 3/4 - 3/5   Clinical Impression  Patient c/o left flank pain she sitting up at bedside, had to lean on bed rail, armrest of chair for support when transferring without AD, had to use RW for safety with fair/good return for using ambulating in room without loss of balance. Patient tolerated sitting up in chair after therapy. Patient will benefit from continued skilled physical therapy in hospital and recommended venue below to increase strength, balance, endurance for safe ADLs and gait.          If plan is discharge home, recommend the following: A little help with walking and/or transfers;A little help with bathing/dressing/bathroom;Help with stairs or ramp for entrance;Assistance with cooking/housework   Can travel by private vehicle        Equipment Recommendations None recommended by PT  Recommendations for Other Services       Functional Status Assessment Patient has had a recent decline in their functional status and demonstrates the ability to make significant improvements in function in a reasonable and predictable amount of time.     Precautions / Restrictions Precautions Precautions: Fall Restrictions Weight Bearing Restrictions Per Provider Order: No      Mobility  Bed Mobility Overal bed mobility: Needs Assistance Bed Mobility: Supine to Sit     Supine to sit: Supervision     General bed mobility comments: increased time, labored  movement    Transfers Overall transfer level: Needs assistance Equipment used: Rolling walker (2 wheels), None Transfers: Sit to/from Stand, Bed to chair/wheelchair/BSC Sit to Stand: Contact guard assist   Step pivot transfers: Contact guard assist, Min assist       General transfer comment: had to lean on bed rail, armrest of chair for support when not using an AD, safer using RW    Ambulation/Gait Ambulation/Gait assistance: Supervision, Contact guard assist Gait Distance (Feet): 20 Feet Assistive device: Rolling walker (2 wheels) Gait Pattern/deviations: Decreased step length - right, Decreased step length - left, Decreased stride length Gait velocity: decreased     General Gait Details: slighty labored movement with good return for ambulating in room using RW without loss of  balance  Stairs            Wheelchair Mobility     Tilt Bed    Modified Rankin (Stroke Patients Only)       Balance Overall balance assessment: Needs assistance Sitting-balance support: Feet supported, No upper extremity supported Sitting balance-Leahy Scale: Good Sitting balance - Comments: seated at EOB   Standing balance support: During functional activity, No upper extremity supported Standing balance-Leahy Scale: Poor Standing balance comment: fair/good using RW                             Pertinent Vitals/Pain Pain Assessment Pain Assessment: 0-10 Pain Score: 8  Pain Location: right flank Pain Descriptors / Indicators: Sore, Grimacing Pain Intervention(s): Limited activity within patient's tolerance, Monitored during session, Repositioned    Home Living  Family/patient expects to be discharged to:: Private residence Living Arrangements: Children Available Help at Discharge: Family;Available 24 hours/day Type of Home: Mobile home Home Access: Stairs to enter Entrance Stairs-Rails: Can reach both;Right;Left Entrance Stairs-Number of Steps: 5   Home Layout:  One level Home Equipment: Rolling Walker (2 wheels);BSC/3in1;Grab bars - toilet;Grab bars - tub/shower;Shower seat      Prior Function Prior Level of Function : Independent/Modified Independent;Driving             Mobility Comments: Community ambulation without AD, drives ADLs Comments: PLOF independent     Extremity/Trunk Assessment   Upper Extremity Assessment Upper Extremity Assessment: Defer to OT evaluation    Lower Extremity Assessment Lower Extremity Assessment: Generalized weakness    Cervical / Trunk Assessment Cervical / Trunk Assessment: Normal  Communication   Communication Communication: No apparent difficulties    Cognition Arousal: Alert Behavior During Therapy: WFL for tasks assessed/performed   PT - Cognitive impairments: No apparent impairments                         Following commands: Intact       Cueing Cueing Techniques: Verbal cues     General Comments      Exercises     Assessment/Plan    PT Assessment Patient needs continued PT services  PT Problem List Decreased strength;Decreased activity tolerance;Decreased balance;Decreased mobility;Pain       PT Treatment Interventions DME instruction;Gait training;Stair training;Functional mobility training;Therapeutic activities;Therapeutic exercise;Balance training;Patient/family education    PT Goals (Current goals can be found in the Care Plan section)  Acute Rehab PT Goals Patient Stated Goal: return home with family to assist PT Goal Formulation: With patient Time For Goal Achievement: 01/10/24 Potential to Achieve Goals: Good    Frequency Min 3X/week     Co-evaluation               AM-PAC PT "6 Clicks" Mobility  Outcome Measure Help needed turning from your back to your side while in a flat bed without using bedrails?: None Help needed moving from lying on your back to sitting on the side of a flat bed without using bedrails?: None Help needed moving to  and from a bed to a chair (including a wheelchair)?: A Little Help needed standing up from a chair using your arms (e.g., wheelchair or bedside chair)?: A Little Help needed to walk in hospital room?: A Little Help needed climbing 3-5 steps with a railing? : A Lot 6 Click Score: 19    End of Session   Activity Tolerance: Patient tolerated treatment well;Patient limited by fatigue Patient left: in chair;with call bell/phone within reach Nurse Communication: Mobility status PT Visit Diagnosis: Unsteadiness on feet (R26.81);Other abnormalities of gait and mobility (R26.89);Muscle weakness (generalized) (M62.81)    Time: 9562-1308 PT Time Calculation (min) (ACUTE ONLY): 30 min   Charges:   PT Evaluation $PT Eval Moderate Complexity: 1 Mod PT Treatments $Therapeutic Activity: 23-37 mins PT General Charges $$ ACUTE PT VISIT: 1 Visit         2:12 PM, 01/07/24 Walton Guppy, MPT Physical Therapist with [facility] [phone] office [phone] mobile phone

## 2024-01-07 NOTE — Plan of Care (Signed)
  Problem: Acute Rehab OT Goals (only OT should resolve) Goal: Pt. Will Perform Lower Body Dressing Flowsheets (Taken 01/07/2024 1005) Pt Will Perform Lower Body Dressing: Independently Goal: Pt. Will Transfer To Toilet Flowsheets (Taken 01/07/2024 1005) Pt Will Transfer to Toilet: Independently Goal: Pt/Caregiver Will Perform Home Exercise Program Flowsheets (Taken 01/07/2024 1005) Pt/caregiver will Perform Home Exercise Program:  Increased strength  Both right and left upper extremity  With written HEP provided  Azell Boll, OTR/L

## 2024-01-07 NOTE — Progress Notes (Signed)
 CRITICAL VALUE STICKER  CRITICAL VALUE: Mg 0.7  RECEIVER (on-site recipient of call): Heron Rosaline Buttery, RN   DATE & TIME NOTIFIED: 01/07/2024 at 0830  MESSENGER (representative from lab): Camelia Hilda  MD NOTIFIED: Dr. Willette  TIME OF NOTIFICATION: 01/07/2024 at 0830  RESPONSE: AMION page and Secure Chat sent.

## 2024-01-07 NOTE — Assessment & Plan Note (Addendum)
-   Blood pressure stable at this point, monitoring closely, -We are holding home BP meds torsemide  and losartan  -due to intractable nausea vomiting - As needed IV hydralazine

## 2024-01-07 NOTE — ED Provider Notes (Signed)
  Physical Exam  BP 117/85   Pulse (!) 108   Temp 98.5 F (36.9 C) (Oral)   Resp 16   SpO2 93%   Physical Exam Vitals and nursing note reviewed.  Constitutional:      Appearance: Normal appearance.  Pulmonary:     Effort: Pulmonary effort is normal.  Skin:    General: Skin is warm and dry.  Neurological:     Mental Status: She is alert and oriented to person, place, and time.     Procedures  Procedures  ED Course / MDM   Clinical Course as of 01/07/24 0345  Thu Jan 06, 2024  2147 WBC(!): 12.1 +leukocytosis [HN]  2149 Tachypneic, tachycardic, +leukocytosis. Called code sepiss. Ordered blood cultures, fluids, abx.  [HN]  2203 K 2.4, Ca 6.8. Repleting IV. [HN]  2203 Lactic Acid, Venous(!!): 5.6 Giving IVF [HN]  2203 Hemoglobin(!): 8.8 +anemia, worsening. Denies rectal bleeding. [HN]  2205 Potassium(!!): 2.4 [HN]  2205 Calcium (!!): 6.3 [HN]  2205 AST(!): 100 [HN]  2206 Lipase: 34 neg [HN]  2308 Lactic Acid, Venous(!!): 5.8 Lactic essentially unchanged, giving a 2nd L LR [HN]    Clinical Course User Index [HN] Merdis Stalling, MD   Medical Decision Making Amount and/or Complexity of Data Reviewed Labs: ordered. Decision-making details documented in ED Course. Radiology: ordered.  Risk Prescription drug management. Decision regarding hospitalization.   Care assumed from Dr. Drury Geralds at shift change.  Patient awaiting results of CT scans of the chest, abdomen, and pelvis to rule out intrathoracic and intra-abdominal pathology.  These studies have returned and are basically negative.  Patient with hypokalemia with potassium of 2.4 and mildly elevated white count.  Her initial lactate was 5.8 and is now trending downward after IV fluids.  Care discussed with the hospitalist who agrees to admit.       Orvilla Blander, MD 01/07/24 [phone]

## 2024-01-07 NOTE — Assessment & Plan Note (Addendum)
-   Monitoring IV fluids, avoiding volume overload -Holding diuretics today-holding torsemide  and losartan  - Last echo reviewed from 04/07/2023: Ejection fracture 50%, previous echo ejection from 35-40%, apical akinesis moderate decrease LV function, with a history of CAD s/p RCA PCI,

## 2024-01-07 NOTE — Plan of Care (Signed)
  Problem: Acute Rehab PT Goals(only PT should resolve) Goal: Pt Will Go Supine/Side To Sit Outcome: Progressing Flowsheets (Taken 01/07/2024 1413) Pt will go Supine/Side to Sit: with modified independence Goal: Patient Will Transfer Sit To/From Stand Outcome: Progressing Flowsheets (Taken 01/07/2024 1413) Patient will transfer sit to/from stand:  with modified independence  with supervision Goal: Pt Will Transfer Bed To Chair/Chair To Bed Outcome: Progressing Flowsheets (Taken 01/07/2024 1413) Pt will Transfer Bed to Chair/Chair to Bed:  with modified independence  with supervision Goal: Pt Will Ambulate Outcome: Progressing Flowsheets (Taken 01/07/2024 1413) Pt will Ambulate:  75 feet  with modified independence  with supervision  with rolling walker   2:13 PM, 01/07/24 Walton Guppy, MPT Physical Therapist with [facility] [phone] office [phone] mobile phone

## 2024-01-07 NOTE — Assessment & Plan Note (Signed)
-  Continue IV fluid hydration -As needed antiemetics -Monitoring and replete electrolytes accordingly -Continue IV fluid hydration -mindful of fluid overload due to CHF

## 2024-01-07 NOTE — Hospital Course (Addendum)
 [name] is a 27 yoM PMH of hypertension, chronic systolic heart failure, depression, CVA, anxiety, and COPD with chronic hypoxemia on 3 L nasal cannula oxygen - who presents to the emergency department due to  N/V x3 days was unable to keep anything down. Also c/o right sided flank pain.   Similar complaint of hospitalization for intractable nausea and vomiting admitted 3/4 - 3/5   ED evaluation/course: Blood pressure (!) 121/51, pulse (!) 110, temperature 98.1 F (36.7 C), temperature source Oral, resp. rate (!) 22, weight 58.2 kg, SpO2 100% LABs: Sodium 134, potassium 2.4, chloride 87, glucose 261, creatinine 1.43, calcium  6.3, anion gap 20, alk phos 153, albumin 2.5, AST 100.  Lactic acid 5.6, 5.8, 3.8, WBC 12.1, hemoglobin 8.8,  Respiratory panel influenza A/B, RSV, COVID all negative UA unremarkable,

## 2024-01-07 NOTE — Assessment & Plan Note (Signed)
-   Refeeding p.o. and with IV fluids -Hypomagnesemia, repleting magnesium  -

## 2024-01-07 NOTE — H&P (Addendum)
 History and Physical   Patient: [name]                            PCP: Medicine, [facility]                    DOB: 1951/03/02            DOA: 01/06/2024 FMW:[id number]             DOS: 01/07/2024, 8:22 AM  Medicine, [facility]  Patient coming from:   HOME  I have personally reviewed patient's medical records, in electronic medical records, including:  [place] link, and care everywhere.    Chief Complaint:   Chief Complaint  Patient presents with   Nausea    History of present illness:    [name] is a 75 yoM PMH of hypertension, chronic systolic heart failure, depression, CVA, anxiety, and COPD with chronic hypoxemia on 3 L nasal cannula oxygen - who presents to the emergency department due to  N/V x3 days was unable to keep anything down. Also c/o right sided flank pain.   Similar complaint of hospitalization for intractable nausea and vomiting admitted 3/4 - 3/5   ED evaluation/course: Blood pressure (!) 121/51, pulse (!) 110, temperature 98.1 F (36.7 C), temperature source Oral, resp. rate (!) 22, weight 58.2 kg, SpO2 100% LABs: Sodium 134, potassium 2.4, chloride 87, glucose 261, creatinine 1.43, calcium  6.3, anion gap 20, alk phos 153, albumin 2.5, AST 100.  Lactic acid 5.6, 5.8, 3.8, WBC 12.1, hemoglobin 8.8,  Respiratory panel influenza A/B, RSV, COVID all negative UA unremarkable,  Patient Denies having: Fever, Chills, Cough, SOB, Chest Pain, Abd pain, headache, dizziness, lightheadedness,  Dysuria, Joint pain, rash, open wounds    Review of Systems: As per HPI, otherwise 10 point review of systems were negative.   ----------------------------------------------------------------------------------------------------------------------  Allergies  Allergen Reactions   Aspirin  Swelling and Other (See Comments)    Only in large doses will cause a reaction   Bee Venom Swelling    Severe swelling   Contrast Media [Iodinated Contrast  Media] Swelling   Lisinopril Swelling   Motrin [Ibuprofen] Swelling   Penicillins Anaphylaxis    Tolerated Rocephin  Jan 2025   Dilaudid  [Hydromorphone  Hcl] Itching    Home MEDs:  Prior to Admission medications   Medication Sig Start Date End Date Taking? Authorizing Provider  acetaminophen  (TYLENOL ) 650 MG CR tablet Take 1,300 mg by mouth every 8 (eight) hours as needed for pain.    [provider]  aspirin  EC 81 MG tablet Take 81 mg by mouth daily. Swallow whole.    [provider]  citalopram  (CELEXA ) 20 MG tablet Take 20 mg by mouth daily. 11/01/23   [provider]  cloNIDine  (CATAPRES ) 0.3 MG tablet Take 0.3 mg by mouth 2 (two) times daily.    [provider]  clopidogrel  (PLAVIX ) 75 MG tablet Take 1 tablet (75 mg total) by mouth daily. 06/17/22   Singh, Prashant K, MD  folic acid  (FOLVITE ) 1 MG tablet Take 1 tablet (1 mg total) by mouth daily. 10/17/21   Evonnie Lenis, MD  Ipratropium-Albuterol  (COMBIVENT ) 20-100 MCG/ACT AERS respimat Inhale 1 puff into the lungs every 6 (six) hours as needed for wheezing or shortness of breath. 05/09/22   Ricky Fines, MD  losartan  (COZAAR ) 25 MG tablet Take 12.5 mg by mouth daily. 10/08/23   [provider]  metoprolol  succinate (  TOPROL  XL) 50 MG 24 hr tablet Take 1 tablet (50 mg total) by mouth daily. Take with or immediately following a meal. 03/30/23   Mallipeddi, Vishnu P, MD  mirtazapine  (REMERON ) 7.5 MG tablet Take 7.5 mg by mouth at bedtime. 04/27/22   [provider]  montelukast  (SINGULAIR ) 10 MG tablet Take 10 mg by mouth at bedtime.    [provider]  ondansetron  (ZOFRAN ) 4 MG tablet Take 1 tablet (4 mg total) by mouth every 8 (eight) hours as needed for nausea or vomiting. 11/24/23   Maree, Pratik D, DO  oxyCODONE -acetaminophen  (PERCOCET) 5-325 MG tablet Take 1 tablet by mouth every 4 (four) hours as needed. 12/29/23   Geroldine Berg, MD  pantoprazole  (PROTONIX ) 40 MG tablet Take 40 mg by  mouth daily.    [provider]  STIOLTO RESPIMAT  2.5-2.5 MCG/ACT AERS SMARTSIG:2 Puff(s) Via Inhaler Daily 10/25/23   [provider]  torsemide  (DEMADEX ) 20 MG tablet Take 40 mg by mouth 2 (two) times daily.    [provider]  traMADol  (ULTRAM ) 50 MG tablet Take 50 mg by mouth 2 (two) times daily. 10/21/23   [provider]  vitamin B-12 (CYANOCOBALAMIN ) 500 MCG tablet Take 1 tablet (500 mcg total) by mouth daily. 10/16/21   Evonnie Lenis, MD    PRN MEDs: acetaminophen  **OR** acetaminophen , bisacodyl , hydrALAZINE , HYDROmorphone  (DILAUDID ) injection, ipratropium, levalbuterol , ondansetron  **OR** ondansetron  (ZOFRAN ) IV, oxyCODONE , senna-docusate, sodium chloride  flush, traZODone   Past Medical History:  Diagnosis Date   Anxiety    Blind    right   Colitis 09/07/2011   CVA (cerebral infarction)    GERD (gastroesophageal reflux disease)    Hyperlipidemia    Hypertension    Myocardial infarct (HCC)    1997, 2004   Severe major depression with psychotic features (HCC)    2004   Stroke [facility]) 2002    Past Surgical History:  Procedure Laterality Date   ABDOMINAL HYSTERECTOMY     BILATERAL SALPINGOOPHORECTOMY     BIOPSY  05/08/2022   Procedure: BIOPSY;  Surgeon: Eartha Angelia Sieving, MD;  [facility]: [facility];  Service: Gastroenterology;;   CHOLECYSTECTOMY N/A 10/15/2021   Procedure: LAPAROSCOPIC CHOLECYSTECTOMY WITH INTRAOPERATIVE CHOLANGIOGRAM;  Surgeon: Mavis Anes, MD;  Location: [facility] ORS;  Service: General;  Laterality: N/A;   COLONOSCOPY N/A 07/17/2014   RMR: Extremely redundant colon. Colonic diverticulosis. Inadequate prepartation precluded examination of all the rectal and colonic.  I suspect slow colonic transit in the setting of a markely redundant colon.   CORONARY ANGIOPLASTY WITH STENT PLACEMENT  [id number]   cyst removed from left wrist     ERCP N/A 10/10/2021   Procedure: ENDOSCOPIC RETROGRADE CHOLANGIOPANCREATOGRAPHY (ERCP);  Surgeon:  Golda Claudis PENNER, MD;  Location: [facility] OR[facility];  Service: Gastroenterology;  Laterality: N/A;   ERCP N/A 10/13/2021   Procedure: ENDOSCOPIC RETROGRADE CHOLANGIOPANCREATOGRAPHY (ERCP);  Surgeon: Golda Claudis PENNER, MD;  Location: [facility];  Service: Gastroenterology;  Laterality: N/A;   ESOPHAGEAL DILATION  05/08/2022   Procedure: ESOPHAGEAL DILATION;  Surgeon: Eartha Angelia, Sieving, MD;  Location: [facility];  Service: Gastroenterology;;  savory dilation'   ESOPHAGOGASTRODUODENOSCOPY  09/05/2004   Dr Vilma gastritis, candida esophagitis   ESOPHAGOGASTRODUODENOSCOPY  09/07/2011   DOQ:Mpwh, esophageal in the distal esophagus/Mild gastritis   ESOPHAGOGASTRODUODENOSCOPY (EGD) WITH PROPOFOL  N/A 05/08/2022   Procedure: ESOPHAGOGASTRODUODENOSCOPY (EGD) WITH PROPOFOL ;  Surgeon: Eartha Angelia Sieving, MD;  [facility]: [facility];  Service: Gastroenterology;  Laterality: N/A;  with possible esophageal dilation   Lumps removed from  each breast     SPHINCTEROTOMY N/A 10/10/2021   Procedure: SPHINCTEROTOMY INCOMPLETE, STONE NOT REMOVED;  Surgeon: Golda Claudis PENNER, MD;  Location: [facility];  Service: Gastroenterology;  Laterality: N/A;     reports that she quit smoking about 6 years ago. Her smoking use included cigarettes. She started smoking about 46 years ago. She has a 10 pack-year smoking history. She has never used smokeless tobacco. She reports current drug use. Drug: Marijuana. She reports that she does not drink alcohol .   Family History  Problem Relation Age of Onset   Multiple sclerosis Father    Alzheimer's disease Mother    Hypertension Mother    Diabetes Mother    Colon cancer Neg Hx     Physical Exam:   Vitals:   01/07/24 0451 01/07/24 0515 01/07/24 0530 01/07/24 0622  BP:  (!) 143/68 (!) 131/49 (!) 121/51  Pulse: 100 96 98 (!) 110  Resp: 20 15 19  (!) 22  Temp:    98.1 F (36.7 C)  TempSrc:    Oral  SpO2: 100% 100% 100% 100%  Weight:   58.2 kg    Constitutional: NAD,  calm, comfortable Eyes: PERRL, lids and conjunctivae normal ENMT: Mucous membranes are moist. Posterior pharynx clear of any exudate or lesions.Normal dentition.  Neck: normal, supple, no masses, no thyromegaly Respiratory: clear to auscultation bilaterally, no wheezing, no crackles. Normal respiratory effort. No accessory muscle use.  Cardiovascular: Regular rate and rhythm, no murmurs / rubs / gallops. No extremity edema. 2+ pedal pulses. No carotid bruits.  Abdomen: no tenderness, no masses palpated. No hepatosplenomegaly. Bowel sounds positive.  Musculoskeletal: no clubbing / cyanosis. No joint deformity upper and lower extremities. Good ROM, no contractures. Normal muscle tone.  Neurologic: CN II-XII grossly intact. Sensation intact, DTR normal. Strength 5/5 in all 4.  Psychiatric: Normal judgment and insight. Alert and oriented x 3. Normal mood.  Skin: no rashes, lesions, ulcers. No induration Decubitus/ulcers:  Wounds: per nursing documentation         Labs on admission:    I have personally reviewed following labs and imaging studies  CBC: Recent Labs  Lab 01/06/24 2123  WBC 12.1*  HGB 8.8*  HCT 28.4*  MCV 103.3*  PLT 406*   Basic Metabolic Panel: Recent Labs  Lab 01/06/24 2123 01/07/24 0415  NA 134*  --   K 2.4* 3.1*  CL 87*  --   CO2 27  --   GLUCOSE 261*  --   BUN 22  --   CREATININE 1.43*  --   CALCIUM  6.3*  --    GFR: Estimated Creatinine Clearance: 27.6 mL/min (A) (by C-G formula based on SCr of 1.43 mg/dL (H)). Liver Function Tests: Recent Labs  Lab 01/06/24 2123  AST 100*  ALT 32  ALKPHOS 153*  BILITOT 0.9  PROT 7.7  ALBUMIN 2.5*   Recent Labs  Lab 01/06/24 2123  LIPASE 34   No results for input(s): AMMONIA in the last 168 hours. Coagulation Profile: Recent Labs  Lab 01/06/24 2123  INR 1.2    Urine analysis:    Component Value Date/Time   COLORURINE YELLOW 01/07/2024 0130   APPEARANCEUR HAZY (A) 01/07/2024 0130   LABSPEC  1.013 01/07/2024 0130   PHURINE 5.0 01/07/2024 0130   GLUCOSEU NEGATIVE 01/07/2024 0130   HGBUR NEGATIVE 01/07/2024 0130   BILIRUBINUR NEGATIVE 01/07/2024 0130   KETONESUR NEGATIVE 01/07/2024 0130   PROTEINUR 30 (A) 01/07/2024 0130   UROBILINOGEN 1.0 07/16/2014 0244  NITRITE NEGATIVE 01/07/2024 0130   LEUKOCYTESUR NEGATIVE 01/07/2024 0130    Last A1C:  Lab Results  Component Value Date   HGBA1C 6.0 (H) 10/13/2023     Radiologic Exams on Admission:   CT ABDOMEN PELVIS W CONTRAST Result Date: 01/07/2024 CLINICAL DATA:  Nausea and vomiting for several days with right-sided abdominal pain, initial encounter EXAM: CT ANGIOGRAPHY CHEST CT ABDOMEN AND PELVIS WITH CONTRAST TECHNIQUE: Multidetector CT imaging of the chest was performed using the standard protocol during bolus administration of intravenous contrast. Multiplanar CT image reconstructions and MIPs were obtained to evaluate the vascular anatomy. Multidetector CT imaging of the abdomen and pelvis was performed using the standard protocol during bolus administration of intravenous contrast. RADIATION DOSE REDUCTION: This exam was performed according to the departmental dose-optimization program which includes automated exposure control, adjustment of the mA and/or kV according to patient size and/or use of iterative reconstruction technique. CONTRAST:  OMNIPAQUE  IOHEXOL  350 MG/ML SOLN COMPARISON:  Chest x-ray from the previous day, CT from 12/28/2023 FINDINGS: CTA CHEST FINDINGS Cardiovascular: Atherosclerotic calcifications of the thoracic aorta are noted. No aneurysmal dilatation or dissection is seen. No cardiac enlargement is noted. Scattered coronary calcifications are seen. The pulmonary artery shows a normal branching pattern bilaterally. No intraluminal filling defect to suggest pulmonary embolism is noted. Mediastinum/Nodes: Thoracic inlet is within normal limits. No hilar or mediastinal adenopathy is noted. The esophagus as  visualized is within normal limits. Lungs/Pleura: Diffuse emphysematous changes are noted. These are worse on the right than the left. No parenchymal nodules are seen. Scattered areas of scarring are noted. No effusion is seen. Musculoskeletal: Acute rib fractures are noted on the right involving the lateral aspect of the second, ninth and tenth ribs laterally. No significant displacement is seen. No pneumothorax is noted. Some healing subacute rib fractures are seen on the right as well. Review of the MIP images confirms the above findings. CT ABDOMEN and PELVIS FINDINGS Hepatobiliary: No focal liver abnormality is seen. Status post cholecystectomy. No biliary dilatation. Pancreas: Unremarkable. No pancreatic ductal dilatation or surrounding inflammatory changes. Spleen: Normal in size without focal abnormality. Adrenals/Urinary Tract: Adrenal glands are within normal limits. Kidneys are well visualized bilaterally. Simple cysts are noted in the left kidney stable from the prior exam. No calculi or obstructive changes are seen. The bladder is well distended. Stomach/Bowel: The appendix has been surgically removed. No obstructive or inflammatory changes of the colon are seen. Stomach and small bowel are within normal limits. Vascular/Lymphatic: Aortic atherosclerosis. 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 Reconstitution at the level of the iliac bifurcation is noted. No enlarged abdominal or pelvic lymph nodes. Reproductive: Status post hysterectomy. No adnexal masses. Other: No abdominal wall hernia or abnormality. No abdominopelvic ascites. Musculoskeletal: Mild degenerative changes of the lumbar spine are seen. Review of the MIP images confirms the above findings. IMPRESSION: CTA of the chest: No evidence of pulmonary emboli. Multiple acute rib fractures on the right involving the second, ninth and tenth ribs laterally. Some healing fractures are noted as well. Severe emphysematous  changes. 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 No acute abnormality in the abdomen. Electronically Signed   By: Oneil Devonshire M.D.   On: 01/07/2024 02:54   CT Angio Chest PE W and/or Wo Contrast Result Date: 01/07/2024 CLINICAL DATA:  Nausea and vomiting for several days with right-sided abdominal pain, initial encounter EXAM: CT ANGIOGRAPHY CHEST CT ABDOMEN AND  PELVIS WITH CONTRAST TECHNIQUE: Multidetector CT imaging of the chest was performed using the standard protocol during bolus administration of intravenous contrast. Multiplanar CT image reconstructions and MIPs were obtained to evaluate the vascular anatomy. Multidetector CT imaging of the abdomen and pelvis was performed using the standard protocol during bolus administration of intravenous contrast. RADIATION DOSE REDUCTION: This exam was performed according to the departmental dose-optimization program which includes automated exposure control, adjustment of the mA and/or kV according to patient size and/or use of iterative reconstruction technique. CONTRAST:  OMNIPAQUE  IOHEXOL  350 MG/ML SOLN COMPARISON:  Chest x-ray from the previous day, CT from 12/28/2023 FINDINGS: CTA CHEST FINDINGS Cardiovascular: Atherosclerotic calcifications of the thoracic aorta are noted. No aneurysmal dilatation or dissection is seen. No cardiac enlargement is noted. Scattered coronary calcifications are seen. The pulmonary artery shows a normal branching pattern bilaterally. No intraluminal filling defect to suggest pulmonary embolism is noted. Mediastinum/Nodes: Thoracic inlet is within normal limits. No hilar or mediastinal adenopathy is noted. The esophagus as visualized is within normal limits. Lungs/Pleura: Diffuse emphysematous changes are noted. These are worse on the right than the left. No parenchymal nodules are seen. Scattered areas of scarring are noted. No effusion is seen. Musculoskeletal: Acute rib  fractures are noted on the right involving the lateral aspect of the second, ninth and tenth ribs laterally. No significant displacement is seen. No pneumothorax is noted. Some healing subacute rib fractures are seen on the right as well. Review of the MIP images confirms the above findings. CT ABDOMEN and PELVIS FINDINGS Hepatobiliary: No focal liver abnormality is seen. Status post cholecystectomy. No biliary dilatation. Pancreas: Unremarkable. No pancreatic ductal dilatation or surrounding inflammatory changes. Spleen: Normal in size without focal abnormality. Adrenals/Urinary Tract: Adrenal glands are within normal limits. Kidneys are well visualized bilaterally. Simple cysts are noted in the left kidney stable from the prior exam. No calculi or obstructive changes are seen. The bladder is well distended. Stomach/Bowel: The appendix has been surgically removed. No obstructive or inflammatory changes of the colon are seen. Stomach and small bowel are within normal limits. Vascular/Lymphatic: Aortic atherosclerosis. 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 Reconstitution at the level of the iliac bifurcation is noted. No enlarged abdominal or pelvic lymph nodes. Reproductive: Status post hysterectomy. No adnexal masses. Other: No abdominal wall hernia or abnormality. No abdominopelvic ascites. Musculoskeletal: Mild degenerative changes of the lumbar spine are seen. Review of the MIP images confirms the above findings. IMPRESSION: CTA of the chest: No evidence of pulmonary emboli. Multiple acute rib fractures on the right involving the second, ninth and tenth ribs laterally. Some healing fractures are noted as well. Severe emphysematous changes. 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 No acute abnormality in the abdomen. Electronically Signed   By: Oneil Devonshire M.D.   On: 01/07/2024 02:54   DG Chest Portable 1 View Result Date:  01/06/2024 CLINICAL DATA:  Right chest pain EXAM: PORTABLE CHEST 1 VIEW COMPARISON:  12/28/2023 FINDINGS: Asymmetric bullous emphysema within the right apex again noted. Interval development of central pulmonary vascular congestion and superimposed trace interstitial pulmonary infiltrate in keeping with shin mild interstitial pulmonary edema, asymmetrically more severe within the right lung base. No pneumothorax or pleural effusion. Cardiac size within normal limits. No acute bone abnormality. IMPRESSION: 1. Interval development of mild interstitial pulmonary edema, asymmetrically more severe within the right lung base. 2. 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   On: 01/06/2024 22:25    EKG:   Independently reviewed.  Orders placed or performed during the hospital encounter of 01/06/24   ED EKG   ED EKG   EKG 12-Lead   EKG 12-Lead   EKG 12-Lead   EKG   EKG   ---------------------------------------------------------------------------------------------------------------------------------------    Assessment / Plan:   Principal Problem:   Intractable nausea and vomiting Active Problems:   Hypokalemia   Pressure injury of skin   Chronic hypoxic respiratory failure (HCC)   Chronic systolic CHF (congestive heart failure) (HCC)   Essential hypertension   Dehydration   Noninfectious gastroenteritis   Gastroesophageal reflux disease   Chronic kidney disease, stage 3a (HCC)   Lactic acidosis   Assessment and Plan: * Intractable nausea and vomiting -Continue IV fluid hydration -As needed antiemetics -Monitoring and replete electrolytes accordingly -Continue IV fluid hydration -mindful of fluid overload due to CHF  Hypokalemia - Refeeding p.o. and with IV fluids -Hypomagnesemia, repleting magnesium  -  Chronic systolic CHF (congestive heart failure) (HCC) - Monitoring IV fluids, avoiding volume  overload -Holding diuretics today-holding torsemide  and losartan  - Last echo reviewed from 04/07/2023: Ejection fracture 50%, previous echo ejection from 35-40%, apical akinesis moderate decrease LV function, with a history of CAD s/p RCA PCI,    Chronic hypoxic respiratory failure (HCC) - Acute on chronic respiratory failure-due to multiple comorbidities including CHF -Currently stable satting 100% on 1 L  Pressure injury of skin - Continue wound care, monitoring  Lactic acidosis - Lactic acid 5.6, 5.8, 3.8 now, -Mild leukocytosis of 12.1, afebrile, normotensive -Sepsis ruled out -Lactic acidosis due to intractable nausea vomiting causing  dehydration -Continue with IV fluid resuscitation, monitoring lactic acid - S/p receiving IV antibiotics cefepime /Flagyl -considering continued Flagyl  for today no signs of bacterial infection  Chronic kidney disease, stage 3a (HCC) - History of CKD stage IIIa, Worsening kidney function from baseline Lab Results  Component Value Date   CREATININE 1.43 (H) 01/06/2024   CREATININE 1.47 (H) 12/28/2023   CREATININE 0.95 11/24/2023   -Continue IV fluids, avoiding nephrotoxins   Gastroesophageal reflux disease - Continue PPI -if not tolerating p.o., will switch to IV  Noninfectious gastroenteritis - Nausea vomiting, but no diarrhea WBC 12.1, afebrile-  Patient received IV antibiotics, Flagyl  and cefepime  -Considering continuing Flagyl  for today Likely viral versus bacterial  Dehydration - Due to intractable nausea vomiting, continue IV fluids  Essential hypertension - Blood pressure stable at this point, monitoring closely, -We are holding home BP meds torsemide  and losartan  -due to intractable nausea vomiting - As needed IV hydralazine               Consults called:  None -------------------------------------------------------------------------------------------------------------------------------------------- DVT  prophylaxis:  heparin  injection 5,000 Units Start: 01/07/24 1400 TED hose Start: 01/07/24 0749 SCDs Start: 01/07/24 0749   Code Status:   Code Status: Full Code   Admission status: Patient will be admitted as Observation, with a greater than 2 midnight length of stay. Level of care: Telemetry   Family Communication:  none at bedside  (The above findings and plan of care has been discussed with patient in detail, the patient expressed understanding and agreement of above plan)  --------------------------------------------------------------------------------------------------------------------------------------------------  Disposition Plan:  Anticipated 1-2 days Status is: Inpatient Remains inpatient appropriate because: Needing aggressive IV fluid resuscitation, treating lactic acidosis, treating severe electrolyte abnormalities.     ----------------------------------------------------------------------------------------------------------------------------------------------------  Time spent:  45  Min.  Was spent seeing  and evaluating the patient, reviewing all medical records, drawn plan of care.  SIGNED: Adriana DELENA Grams, MD, FHM. FAAFP. [place] [facility], Pager  (Please use [email or web address] to page/ or secure chat through epic) If 7PM-7AM, please contact night-coverage [email or web address],  01/07/2024, 8:22 AM

## 2024-01-07 NOTE — Assessment & Plan Note (Signed)
-   Lactic acid 5.6, 5.8, 3.8 now, -Mild leukocytosis of 12.1, afebrile, normotensive -Sepsis ruled out -Lactic acidosis due to intractable nausea vomiting causing  dehydration -Continue with IV fluid resuscitation, monitoring lactic acid - S/p receiving IV antibiotics cefepime /Flagyl -considering continued Flagyl  for today no signs of bacterial infection

## 2024-01-07 NOTE — Assessment & Plan Note (Signed)
-   Continue PPI -if not tolerating p.o., will switch to IV

## 2024-01-07 NOTE — Assessment & Plan Note (Signed)
-   Due to intractable nausea vomiting, continue IV fluids

## 2024-01-07 NOTE — TOC Initial Note (Addendum)
 [name]) - Initial/Assessment Note    Patient Details  Name: [name] MRN: [id number] Date of Birth: Mar 11, 1951  [name]) CM/SW Contact:    Sharlyne Stabs, RN Phone Number: 01/07/2024, 11:08 AM  Clinical Narrative:        Patient admitted with intractable nausea and vomiting. Patient has a high risk for readmission. CM spoke with her son. She live with him, he provides transportation as needed. She has equipment need and uses 3L of home oxygen  at baseline, provided by [facility].  PT is recommending HHPT. He is agreeable. CMS choices reviewed, they have no preference. MD states patient will be her all weekend.  Addendum:  Set up home health with [facility] accepted. MD aware to place orders. Not ready for discharge.               Expected Discharge Plan: Home w Home Health Services Barriers to Discharge: Continued Medical Work up   Patient Goals and CMS Choice Patient states their goals for this hospitalization and ongoing recovery are:: return home   Expected Discharge Plan and Services     Post Acute April Choice: Home Health Living arrangements for the past 2 months: Single Family Home                     Prior Living Arrangements/Services Living arrangements for the past 2 months: Single Family Home Lives with:: Adult Children Patient language and need for interpreter reviewed:: Yes Do you feel safe going back to the place where you live?: Yes      Need for Family Participation in Patient April: Yes (Comment) April giver support system in place?: Yes (comment) Current home services: DME, Other (comment) (Home Oxygen ) Criminal Activity/Legal Involvement Pertinent to Current Situation/Hospitalization: No - Comment as needed  Activities of Daily Living   ADL Screening (condition at time of admission) Independently performs ADLs?: Yes (appropriate for developmental age) Is the patient deaf or have difficulty hearing?:  No Does the patient have difficulty seeing, even when wearing glasses/contacts?: No Does the patient have difficulty concentrating, remembering, or making decisions?: No  Permission Sought/Granted          Permission granted to share info w Relationship: son     Emotional Assessment     Affect (typically observed): Accepting Orientation: : Oriented to Self, Oriented to Situation, Oriented to Place Alcohol  / Substance Use: Not Applicable Psych Involvement: No (comment)  Admission diagnosis:  Hypocalcemia [E83.51] Dehydration [E86.0] Hypokalemia [E87.6] Intractable nausea and vomiting [R11.2] Nausea and vomiting, unspecified vomiting type [R11.2] Patient Active Problem List   Diagnosis Date Noted   Lactic acidosis 01/07/2024   Intractable nausea and vomiting 11/23/2023   Rectal bleeding 10/16/2023   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 10/01/2022   Chronic hypoxic respiratory failure (HCC) 10/01/2022   Protein-calorie malnutrition, severe 06/13/2022   Impaired glucose tolerance 06/02/2022   Hypernatremia 06/02/2022   Hypophosphatemia 06/01/2022   Hyperkalemia 05/31/2022   Malnutrition of moderate degree 05/30/2022   Lobar pneumonia (HCC) 05/30/2022   Elevated troponin 05/29/2022   Chronic kidney disease, stage 3a (HCC) 05/29/2022   COPD with acute exacerbation (HCC) 05/29/2022   Anemia  Gastroesophageal reflux disease    Nausea vomiting and diarrhea    Dysphagia    Pressure injury of skin 05/03/2022   COVID-19 virus infection 05/02/2022   AKI (acute kidney injury) (HCC) 05/02/2022   Acute respiratory failure with hypoxia and hypercarbia (HCC)  05/02/2022   Hypokalemia    Calculus of gallbladder with acute cholecystitis and obstruction    Choledocholithiasis 10/09/2021   Noninfectious gastroenteritis 07/16/2014   Functional constipation 10/27/2012   Colon cancer screening 10/27/2012   History of stroke 10/26/2012   Chronic back pain 10/26/2012   Dehydration 09/07/2011   HYPERLIPIDEMIA-MIXED 03/18/2009   Essential hypertension 03/18/2009   Disorder of kidney and ureter 03/18/2009   PCP:  Medicine, [facility]:   [facility] [phone] - [place], [place] - [address] AT [facility] [address] [place] [address] Phone: [phone] Fax: [phone]   Social Drivers of Health (SDOH) Social History: SDOH Screenings   Food Insecurity: No Food Insecurity (01/07/2024)  Housing: Low Risk  (01/07/2024)  Transportation Needs: No Transportation Needs (01/07/2024)  Utilities: Not At Risk (01/07/2024)  Social Connections: Unknown (11/23/2023)  Tobacco Use: Medium Risk (01/07/2024)   SDOH Interventions:     Readmission Risk Interventions    01/07/2024   11:02 AM  Readmission Risk Prevention Plan  Transportation Screening Complete  PCP or Specialist Appt within 3-5 Days Not Complete  HRI or Home April Consult Complete  Social Work Consult for Recovery April Planning/Counseling Complete  Palliative April Screening Complete  Medication Review Oceanographer) Complete

## 2024-01-07 NOTE — ED Notes (Signed)
 Patient transported to CT

## 2024-01-08 ENCOUNTER — Inpatient Hospital Stay (HOSPITAL_COMMUNITY)

## 2024-01-08 DIAGNOSIS — R112 Nausea with vomiting, unspecified: Secondary | ICD-10-CM | POA: Diagnosis not present

## 2024-01-08 LAB — BLOOD GAS, ARTERIAL
Acid-base deficit: 2.9 mmol/L — ABNORMAL HIGH (ref 0.0–2.0)
Bicarbonate: 23.7 mmol/L (ref 20.0–28.0)
Drawn by: 22223
O2 Saturation: 98.2 %
Patient temperature: 37
pCO2 arterial: 47 mmHg (ref 32–48)
pH, Arterial: 7.31 — ABNORMAL LOW (ref 7.35–7.45)
pO2, Arterial: 82 mmHg — ABNORMAL LOW (ref 83–108)

## 2024-01-08 LAB — BLOOD CULTURE ID PANEL (REFLEXED) - BCID2

## 2024-01-08 LAB — COMPREHENSIVE METABOLIC PANEL WITH GFR
ALT: 21 U/L (ref 0–44)
AST: 38 U/L (ref 15–41)
Albumin: 1.9 g/dL — ABNORMAL LOW (ref 3.5–5.0)
Alkaline Phosphatase: 101 U/L (ref 38–126)
Anion gap: 11 (ref 5–15)
BUN: 11 mg/dL (ref 8–23)
CO2: 25 mmol/L (ref 22–32)
Calcium: 7.2 mg/dL — ABNORMAL LOW (ref 8.9–10.3)
Chloride: 105 mmol/L (ref 98–111)
Creatinine, Ser: 0.85 mg/dL (ref 0.44–1.00)
GFR, Estimated: >60 mL/min (ref >60)
Glucose, Bld: 135 mg/dL — ABNORMAL HIGH (ref 70–99)
Potassium: 3.3 mmol/L — ABNORMAL LOW (ref 3.5–5.1)
Sodium: 141 mmol/L (ref 135–145)
Total Bilirubin: 0.8 mg/dL (ref 0.0–1.2)
Total Protein: 5.5 g/dL — ABNORMAL LOW (ref 6.5–8.1)

## 2024-01-08 LAB — HEMOGLOBIN AND HEMATOCRIT, BLOOD
HCT: 23.2 % — ABNORMAL LOW (ref 36.0–46.0)
HCT: 30.3 % — ABNORMAL LOW (ref 36.0–46.0)
Hemoglobin: 7.1 g/dL — ABNORMAL LOW (ref 12.0–15.0)
Hemoglobin: 9.6 g/dL — ABNORMAL LOW (ref 12.0–15.0)

## 2024-01-08 LAB — IRON AND TIBC
Iron: 92 ug/dL (ref 28–170)
Saturation Ratios: 89 % — ABNORMAL HIGH (ref 10.4–31.8)
TIBC: 104 ug/dL — ABNORMAL LOW (ref 250–450)
UIBC: 12 ug/dL

## 2024-01-08 LAB — CBC
HCT: 20.6 % — ABNORMAL LOW (ref 36.0–46.0)
Hemoglobin: 6.3 g/dL — CL (ref 12.0–15.0)
MCH: 31.5 pg (ref 26.0–34.0)
MCHC: 30.6 g/dL (ref 30.0–36.0)
MCV: 103 fL — ABNORMAL HIGH (ref 80.0–100.0)
Platelets: 311 10*3/uL (ref 150–400)
RBC: 2 MIL/uL — ABNORMAL LOW (ref 3.87–5.11)
RDW: 13.2 % (ref 11.5–15.5)
WBC: 8.3 10*3/uL (ref 4.0–10.5)
nRBC: 0 % (ref 0.0–0.2)

## 2024-01-08 LAB — SEDIMENTATION RATE: Sed Rate: 85 mm/h — ABNORMAL HIGH (ref 0–22)

## 2024-01-08 LAB — PREPARE RBC (CROSSMATCH)

## 2024-01-08 LAB — ABO/RH: ABO/RH(D): B POS

## 2024-01-08 LAB — TSH: TSH: 0.502 u[IU]/mL (ref 0.350–4.500)

## 2024-01-08 LAB — C-REACTIVE PROTEIN: CRP: 6.1 mg/dL — ABNORMAL HIGH (ref <1.0)

## 2024-01-08 LAB — GLUCOSE, CAPILLARY: Glucose-Capillary: 152 mg/dL — ABNORMAL HIGH (ref 70–99)

## 2024-01-08 MED ORDER — CALCIUM GLUCONATE-NACL 1-0.675 GM/50ML-% IV SOLN
1.0000 g | INTRAVENOUS | Status: AC
  Administered 2024-01-08 (×2): 1000 mg via INTRAVENOUS
  Filled 2024-01-08: qty 50

## 2024-01-08 MED ORDER — SODIUM CHLORIDE 0.9% IV SOLUTION: Freq: Once | INTRAVENOUS | Status: DC

## 2024-01-08 MED ORDER — FUROSEMIDE 10 MG/ML IJ SOLN
20.0000 mg | Freq: Once | INTRAMUSCULAR | Status: AC
  Administered 2024-01-08: 20 mg via INTRAVENOUS
  Filled 2024-01-08: qty 2

## 2024-01-08 MED ORDER — POTASSIUM PHOSPHATES 15 MMOLE/5ML IV SOLN
30.0000 mmol | Freq: Once | INTRAVENOUS | Status: AC
  Administered 2024-01-08: 30 mmol via INTRAVENOUS
  Filled 2024-01-08: qty 10

## 2024-01-08 MED ORDER — MAGNESIUM SULFATE 4 GM/100ML IV SOLN
4.0000 g | Freq: Once | INTRAVENOUS | Status: AC
  Administered 2024-01-08: 4 g via INTRAVENOUS
  Filled 2024-01-08: qty 100

## 2024-01-08 MED ORDER — PANTOPRAZOLE SODIUM 40 MG PO TBEC
40.0000 mg | DELAYED_RELEASE_TABLET | Freq: Every day | ORAL | Status: DC
  Administered 2024-01-09 – 2024-01-12 (×4): 40 mg via ORAL
  Filled 2024-01-08 (×5): qty 1

## 2024-01-08 NOTE — Plan of Care (Signed)

## 2024-01-08 NOTE — Progress Notes (Signed)
 At 1930 this RN assisted patient to the restroom with her walker.  Patient returned to the bed and wanted to sit on the side for a few minutes and she was leaning against her walker.  This RN left the room to check patient's order for blood and was called back to the room with the patient stating that she could not breath well.  I increased her oxygen  from 5L to 6L and called respiratory for a breathing treatment.  Patient was c/o being very hot so I adjusted the temp in the patient's room and helped the patient remove her gown.  Respiratory administered breathing treatment and the charge nurse came in as well to assist.  This RN did a secure chat and explained to provider Elyse Hand) that the patient was not recovering well from coming back from the restroom and that she had rec'd of K+ Phos & a unit of blood prior to this RN coming on to shift.  She appeared to have possible fluid overload.  Provider placed orders for Lasix  and a STAT CXR.  A fan was set up for the patient.  Patient was assisted to get into the bed and continuous pulse ox was applied.  Patient stated that she was feeling a little better.  This RN did place a purewick on the patient due to her becoming so shob with ambulating the bathroom and since Lasix  was administered.

## 2024-01-08 NOTE — Progress Notes (Signed)
 PROGRESS NOTE    Patient: [name]                            PCP: Medicine, [facility]                    DOB: 12/20/1950            DOA: 01/06/2024 ONG:[id number]             DOS: 01/08/2024, 12:11 PM   LOS: 1 day   Date of Service: The patient was seen and examined on 01/08/2024  Subjective:   The patient was seen and examined this morning, in acute distress very upset and agitated does not like poking in blood draws. It was explained to the patient and her son at the bedside that her hemoglobin has dropped-need confirmation and further workup She would likely need blood transfusion. She finally agreed! Overnight hypotensive BP as low as 80/56, currently 112/42 satting 99% on 3 L of oxygen  Her hemoglobin dropped from 8.8-6.3 this morning, reports of no active bleeding.  Complaining of generalized weakness.  Denies any headaches visual changes or dizziness.  Denies any chest pain.   Brief Narrative:   [name] is a 68 yoM PMH of hypertension, chronic systolic heart failure, depression, CVA, anxiety, and COPD with chronic hypoxemia on 3 L nasal cannula oxygen - who presents to the emergency department due to  N/V x3 days was unable to keep anything down. Also c/o right sided flank pain.   Similar complaint of hospitalization for intractable nausea and vomiting admitted 3/4 - 3/5   ED evaluation/course: Blood pressure (!) 121/51, pulse (!) 110, temperature 98.1 F (36.7 C), temperature source Oral, resp. rate (!) 22, weight 58.2 kg, SpO2 100% LABs: Sodium 134, potassium 2.4, chloride 87, glucose 261, creatinine 1.43, calcium  6.3, anion gap 20, alk phos 153, albumin 2.5, AST 100.  Lactic acid 5.6, 5.8, 3.8, WBC 12.1, hemoglobin 8.8,  Respiratory panel influenza A/B, RSV, COVID all negative UA unremarkable,      Assessment & Plan:   Principal Problem:   Intractable nausea and vomiting Active Problems:   Hypokalemia   Pressure injury of skin   Chronic  hypoxic respiratory failure (HCC)   Chronic systolic CHF (congestive heart failure) (HCC)   Essential hypertension   Dehydration   Noninfectious gastroenteritis   Gastroesophageal reflux disease   Chronic kidney disease, stage 3a (HCC)   Lactic acidosis     Assessment and Plan: * Intractable nausea and vomiting -Improved!  N.p.o., advancing diet -zS/P IV fluid hydration -As needed antiemetics - Workup including CT scan of abdomen pelvis all reviewed, negative for any pathology likely viral gastritis  Hypokalemia/homemakers anemia, hypocalcemia, hypophosphatemia Potassium 3.1, 3.3, magnesium  0.8, phosphate 1.7, albumin 1.9, - Continue to monitor very closely, replete electrolytes IV for now - Anticipating switching and repleting with p.o. potassium  Chronic systolic CHF (congestive heart failure) (HCC) - Discontinue further IV fluids due to history of CHF-avoiding volume overload -Holding diuretics today-holding torsemide  and losartan  - Last echo reviewed from 04/07/2023: Ejection fracture 50%, previous echo ejection from 35-40%, apical akinesis moderate decrease LV function, with a history of CAD s/p RCA PCI,    Chronic hypoxic respiratory failure (HCC) - Acute on chronic respiratory failure-due to multiple comorbidities including CHF - To demand has gone up from 1 L to 3 L, satting 99%  Pressure injury of skin - Continue wound care,  monitoring  Lactic acidosis - Lactic acid 5.6, 5.8, 3.8 >> 3.7 now, -Mild leukocytosis of 12.1>> 8.3 - afebrile, normotensive -Sepsis ruled out -no other signs of infection -Lactic acidosis due to intractable nausea vomiting causing  dehydration -Gentle IV fluid hydration  - S/p receiving IV antibiotics cefepime /Flagyl -considering continued Flagyl  for today no signs of bacterial infection  Chronic kidney disease, stage 3a (HCC) - History of CKD stage IIIa, Worsening kidney function from baseline Lab Results  Component Value Date    CREATININE 1.43 (H) 01/06/2024   CREATININE 1.47 (H) 12/28/2023   CREATININE 0.95 11/24/2023   -Continue IV fluids, avoiding nephrotoxins   Gastroesophageal reflux disease - Continue PPI -if not tolerating p.o., will switch to IV  Noninfectious gastroenteritis - Nausea vomiting, but no diarrhea WBC 12.1, afebrile-  Patient received IV antibiotics, Flagyl  and cefepime  -Considering continuing Flagyl  for today Likely viral versus bacterial  Dehydration - Due to intractable nausea vomiting, continue IV fluids  Essential hypertension - Blood pressure stable at this point, monitoring closely, -We are holding home BP meds torsemide  and losartan  -due to intractable nausea vomiting - As needed IV hydralazine   Acute on chronic anemia -anemia of chronic disease -Globin dropped from 8.8-6.3 this morning Iron/TIBC/Ferritin/ %Sat    Component Value Date/Time   IRON 92 01/08/2024 0548   TIBC 104 (L) 01/08/2024 0548   FERRITIN 7,392 (H) 05/30/2022 0525   IRONPCTSAT 89 (H) 01/08/2024 1610   - Likely aggressive hydration post severe dehydration - Patient still complain of generalized weaknesses, will proceed with 2U PRBC blood transfusion-was not because of blood transfusion discussed with the patient and her son at bedside they have agreed to pursue  Reporting of no rectal bleed, no hematochezia or melena Iron level within normal limits, with exception significantly Eleva elevated ferritin likely acute reactant -Obtaining Hemoccult   01/08/2024 - transfusing 2U PRBC       ----------------------------------------------------------------------------------------------------------------------------------------------- Nutritional status:  The patient's BMI is: Body mass index is 25.88 kg/m. I agree with the assessment and plan as outlined   DVT prophylaxis:  heparin  injection 5,000 Units Start: 01/07/24 1400 TED hose Start: 01/07/24 0749 SCDs Start: 01/07/24 0749   Code Status:    Code Status: Full Code  Family Communication: POA-son present at bedside, updated. -Advance care planning has been discussed.   Admission status:   Status is: Inpatient Remains inpatient appropriate because: Needing aggressive electrolyte replacement, needing blood transfusion   Disposition: From  - home             Planning for discharge in 1-2 days: to Home   Procedures:   No admission procedures for hospital encounter.   Antimicrobials:  Anti-infectives (From admission, onward)    Start     Dose/Rate Route Frequency Ordered Stop   01/06/24 2200  ceFEPIme  (MAXIPIME ) 2 g in sodium chloride  0.9 % 100 mL IVPB        2 g 200 mL/hr over 30 Minutes Intravenous  Once 01/06/24 2149 01/06/24 2303   01/06/24 2200  metroNIDAZOLE  (FLAGYL ) IVPB 500 mg        500 mg 100 mL/hr over 60 Minutes Intravenous Every 12 hours 01/06/24 2149          Medication:   sodium chloride    Intravenous Once   arformoterol   15 mcg Nebulization BID   And   umeclidinium bromide   1 puff Inhalation Daily   aspirin  EC  81 mg Oral Daily   citalopram   20 mg Oral Daily   cloNIDine   0.3 mg Oral BID   clopidogrel   75 mg Oral Daily   folic acid   1 mg Oral Daily   heparin   5,000 Units Subcutaneous Q8H   losartan   12.5 mg Oral Daily   metoprolol  succinate  50 mg Oral Daily   mirtazapine   7.5 mg Oral QHS   montelukast   10 mg Oral QHS   [START ON 01/09/2024] pantoprazole   40 mg Oral Daily   sodium chloride  flush  3 mL Intravenous Q12H   cyanocobalamin   500 mcg Oral Daily    acetaminophen  **OR** acetaminophen , bisacodyl , hydrALAZINE , HYDROmorphone  (DILAUDID ) injection, ipratropium, levalbuterol , ondansetron  **OR** ondansetron  (ZOFRAN ) IV, oxyCODONE , senna-docusate, sodium chloride  flush, traZODone    Objective:   Vitals:   01/07/24 1953 01/08/24 0005 01/08/24 0517 01/08/24 0750  BP: (!) 109/59 (!) 105/56 (!) 112/42   Pulse: 97 70 73   Resp: (!) 22 18 (!) 22   Temp: 97.6 F (36.4 C)  97.6 F (36.4 C)    TempSrc: Oral  Oral   SpO2: 100% 100% 100% 99%  Weight:   60.1 kg     Intake/Output Summary (Last 24 hours) at 01/08/2024 1211 Last data filed at 01/08/2024 0941 Gross per 24 hour  Intake 685.08 ml  Output --  Net 685.08 ml   Filed Weights   01/07/24 0530 01/08/24 0517  Weight: 58.2 kg 60.1 kg     Physical examination:        General:  AAO x 3,  cooperative, no distress;   HEENT:  Normocephalic, PERRL, otherwise with in Normal limits   Neuro:  CNII-XII intact. , normal motor and sensation, reflexes intact   Lungs:   Clear to auscultation BL, Respirations unlabored,  No wheezes / crackles  Cardio:    S1/S2, RRR, No murmure, No Rubs or Gallops   Abdomen:  Soft, non-tender, bowel sounds active all four quadrants, no guarding or peritoneal signs.  Muscular  skeletal:  Limited exam -global generalized weaknesses - in bed, able to move all 4 extremities,   2+ pulses,  symmetric, No pitting edema  Skin:  Dry, warm to touch, negative for any Rashes,  Wounds: Please see nursing documentation         ------------------------------------------------------------------------------------------------------------------------------------------    LABs:     Latest Ref Rng & Units 01/08/2024    9:44 AM 01/08/2024    4:56 AM 01/06/2024    9:23 PM  CBC  WBC 4.0 - 10.5 K/uL  8.3  12.1   Hemoglobin 12.0 - 15.0 g/dL 7.1  6.3  8.8   Hematocrit 36.0 - 46.0 % 23.2  20.6  28.4   Platelets 150 - 400 K/uL  311  406       Latest Ref Rng & Units 01/08/2024    4:56 AM 01/07/2024    4:15 AM 01/06/2024    9:23 PM  CMP  Glucose 70 - 99 mg/dL 161   096   BUN 8 - 23 mg/dL 11   22   Creatinine 0.45 - 1.00 mg/dL 4.09   8.11   Sodium 914 - 145 mmol/L 141   134   Potassium 3.5 - 5.1 mmol/L 3.3  3.1  2.4   Chloride 98 - 111 mmol/L 105   87   CO2 22 - 32 mmol/L 25   27   Calcium  8.9 - 10.3 mg/dL 7.2   6.3   Total Protein 6.5 - 8.1 g/dL 5.5   7.7   Total Bilirubin 0.0 - 1.2 mg/dL 0.8  0.9    Alkaline Phos 38 - 126 U/L 101   153   AST 15 - 41 U/L 38   100   ALT 0 - 44 U/L 21   32        Micro Results Recent Results (from the past 240 hours)  Resp panel by RT-PCR (RSV, Flu A&B, Covid) Anterior Nasal Swab     Status: None   Collection Time: 01/06/24 10:12 PM   Specimen: Anterior Nasal Swab  Result Value Ref Range Status   SARS Coronavirus 2 by RT PCR NEGATIVE NEGATIVE Final    Comment: (NOTE) SARS-CoV-2 target nucleic acids are NOT DETECTED.  The SARS-CoV-2 RNA is generally detectable in upper respiratory specimens during the acute phase of infection. The lowest concentration of SARS-CoV-2 viral copies this assay can detect is 138 copies/mL. A negative result does not preclude SARS-Cov-2 infection and should not be used as the sole basis for treatment or other patient management decisions. A negative result may occur with  improper specimen collection/handling, submission of specimen other than nasopharyngeal swab, presence of viral mutation(s) within the areas targeted by this assay, and inadequate number of viral copies(<138 copies/mL). A negative result must be combined with clinical observations, patient history, and epidemiological information. The expected result is Negative.  Fact Sheet for Patients:  BloggerCourse.com  Fact Sheet for Healthcare Providers:  SeriousBroker.it  This test is no t yet approved or cleared by the [place]  FDA and  has been authorized for detection and/or diagnosis of SARS-CoV-2 by FDA under an Emergency Use Authorization (EUA). This EUA will remain  in effect (meaning this test can be used) for the duration of the COVID-19 declaration under Section 564(b)(1) of the Act, 21 U.S.C.section 360bbb-3(b)(1), unless the authorization is terminated  or revoked sooner.       Influenza A by PCR NEGATIVE NEGATIVE Final   Influenza B by PCR NEGATIVE NEGATIVE Final    Comment:  (NOTE) The Xpert Xpress SARS-CoV-2/FLU/RSV plus assay is intended as an aid in the diagnosis of influenza from Nasopharyngeal swab specimens and should not be used as a sole basis for treatment. Nasal washings and aspirates are unacceptable for Xpert Xpress SARS-CoV-2/FLU/RSV testing.  Fact Sheet for Patients: BloggerCourse.com  Fact Sheet for Healthcare Providers: SeriousBroker.it  This test is not yet approved or cleared by the [place]  FDA and has been authorized for detection and/or diagnosis of SARS-CoV-2 by FDA under an Emergency Use Authorization (EUA). This EUA will remain in effect (meaning this test can be used) for the duration of the COVID-19 declaration under Section 564(b)(1) of the Act, 21 U.S.C. section 360bbb-3(b)(1), unless the authorization is terminated or revoked.     Resp Syncytial Virus by PCR NEGATIVE NEGATIVE Final    Comment: (NOTE) Fact Sheet for Patients: BloggerCourse.com  Fact Sheet for Healthcare Providers: SeriousBroker.it  This test is not yet approved or cleared by the [place]  FDA and has been authorized for detection and/or diagnosis of SARS-CoV-2 by FDA under an Emergency Use Authorization (EUA). This EUA will remain in effect (meaning this test can be used) for the duration of the COVID-19 declaration under Section 564(b)(1) of the Act, 21 U.S.C. section 360bbb-3(b)(1), unless the authorization is terminated or revoked.  Performed at [facility], [address], [place], [place] [address]   Blood culture (routine x 2)     Status: None (Preliminary result)   Collection Time: 01/06/24 10:20 PM   Specimen: BLOOD  Result Value Ref Range Status  Specimen Description   Final    BLOOD BLOOD LEFT ARM Performed at [facility], [address], [place], [place] [address]    Special Requests   Final    BOTTLES DRAWN AEROBIC AND  ANAEROBIC Blood Culture adequate volume Performed at [facility], [address], [place], [place] [address]    Culture  Setup Time   Final    GRAM POSITIVE COCCI ANAEROBIC BOTTLE ONLY CRITICAL RESULT CALLED TO, READ BACK BY AND VERIFIED WITH: ANGIE R. ON 01/07/2024 @10 :41PM BY T.HAMER Organism ID to follow Performed at [facility], [address] [place], [place] [address]    Culture GRAM POSITIVE COCCI  Final   Report Status PENDING  Incomplete  Blood culture (routine x 2)     Status: None (Preliminary result)   Collection Time: 01/06/24 10:20 PM   Specimen: BLOOD  Result Value Ref Range Status   Specimen Description BLOOD BLOOD RIGHT HAND  Final   Special Requests   Final    BOTTLES DRAWN AEROBIC AND ANAEROBIC Blood Culture adequate volume   Culture   Final    NO GROWTH < 12 HOURS Performed at [facility], [address] [place], [place] [address]    Report Status PENDING  Incomplete    Radiology Reports No results found.  SIGNED: Bobbetta Burnet, MD, FHM. FAAFP. Arlin Benes - [facility] Time spent - 55 min.  In seeing, evaluating and examining the patient. Reviewing medical records, labs, drawn plan of care. [facility],  Pager (please use [email or web address] to page/ text) Please use Epic Secure Chat for non-urgent communication (7AM-7PM)  If 7PM-7AM, please contact night-coverage [email or web address], 01/08/2024, 12:11 PM

## 2024-01-08 NOTE — Plan of Care (Signed)
 ?  Problem: Education: ?Goal: Knowledge of General Education information will improve ?Description: Including pain rating scale, medication(s)/side effects and non-pharmacologic comfort measures ?Outcome: Progressing ?  ?Problem: Clinical Measurements: ?Goal: Ability to maintain clinical measurements within normal limits will improve ?Outcome: Progressing ?Goal: Diagnostic test results will improve ?Outcome: Progressing ?Goal: Respiratory complications will improve ?Outcome: Progressing ?  ?

## 2024-01-09 DIAGNOSIS — J810 Acute pulmonary edema: Secondary | ICD-10-CM

## 2024-01-09 DIAGNOSIS — J9621 Acute and chronic respiratory failure with hypoxia: Secondary | ICD-10-CM | POA: Diagnosis not present

## 2024-01-09 DIAGNOSIS — R112 Nausea with vomiting, unspecified: Secondary | ICD-10-CM | POA: Diagnosis not present

## 2024-01-09 LAB — COMPREHENSIVE METABOLIC PANEL WITH GFR
ALT: 28 U/L (ref 0–44)
AST: 46 U/L — ABNORMAL HIGH (ref 15–41)
Albumin: 2.2 g/dL — ABNORMAL LOW (ref 3.5–5.0)
Alkaline Phosphatase: 129 U/L — ABNORMAL HIGH (ref 38–126)
Anion gap: 12 (ref 5–15)
BUN: 7 mg/dL — ABNORMAL LOW (ref 8–23)
CO2: 24 mmol/L (ref 22–32)
Calcium: 7.5 mg/dL — ABNORMAL LOW (ref 8.9–10.3)
Chloride: 102 mmol/L (ref 98–111)
Creatinine, Ser: 0.84 mg/dL (ref 0.44–1.00)
GFR, Estimated: >60 mL/min (ref >60)
Glucose, Bld: 140 mg/dL — ABNORMAL HIGH (ref 70–99)
Potassium: 3.8 mmol/L (ref 3.5–5.1)
Sodium: 138 mmol/L (ref 135–145)
Total Bilirubin: 0.8 mg/dL (ref 0.0–1.2)
Total Protein: 6 g/dL — ABNORMAL LOW (ref 6.5–8.1)

## 2024-01-09 LAB — MRSA NEXT GEN BY PCR, NASAL: MRSA by PCR Next Gen: DETECTED — AB

## 2024-01-09 LAB — GLUCOSE, CAPILLARY: Glucose-Capillary: 124 mg/dL — ABNORMAL HIGH (ref 70–99)

## 2024-01-09 MED ORDER — CHLORHEXIDINE GLUCONATE CLOTH 2 % EX PADS
6.0000 | MEDICATED_PAD | Freq: Every day | CUTANEOUS | Status: DC
  Administered 2024-01-10 – 2024-01-14 (×4): 6 via TOPICAL

## 2024-01-09 MED ORDER — MIDODRINE HCL 5 MG PO TABS
10.0000 mg | ORAL_TABLET | Freq: Once | ORAL | Status: AC
  Administered 2024-01-10: 10 mg via ORAL
  Filled 2024-01-09: qty 2

## 2024-01-09 MED ORDER — FUROSEMIDE 10 MG/ML IJ SOLN
20.0000 mg | Freq: Three times a day (TID) | INTRAMUSCULAR | Status: DC
  Administered 2024-01-09 (×2): 20 mg via INTRAVENOUS
  Filled 2024-01-09 (×2): qty 2

## 2024-01-09 MED ORDER — MUPIROCIN 2 % EX OINT
TOPICAL_OINTMENT | Freq: Two times a day (BID) | CUTANEOUS | Status: DC
  Administered 2024-01-10 – 2024-01-13 (×3): 1 via NASAL
  Filled 2024-01-09 (×3): qty 22

## 2024-01-09 MED ORDER — ATORVASTATIN CALCIUM 20 MG PO TABS
20.0000 mg | ORAL_TABLET | Freq: Every day | ORAL | Status: DC
  Administered 2024-01-09 – 2024-01-13 (×5): 20 mg via ORAL
  Filled 2024-01-09 (×5): qty 1

## 2024-01-09 NOTE — Plan of Care (Signed)

## 2024-01-09 NOTE — Progress Notes (Signed)
 PROGRESS NOTE  [name], is a 73 y.o. female, DOB - [birth year], WUJ:[id number]  Admit date - 01/06/2024   Admitting Physician Twilla Galea, DO  Outpatient Primary MD for the patient is [facility]  LOS - 2  Chief Complaint  Patient presents with   Nausea      Brief Narrative:  [name] is a 32 yoM PMH of hypertension, chronic systolic heart failure, depression, CVA, anxiety, and COPD with chronic hypoxemia on 3 L nasal cannula oxygen  admitted on 01/07/24 with intractable emesis and right flank pain presumed related to recent rib fractures from recent fall   -Assessment and Plan: 1)Intractable nausea and vomiting - Mostly resolved - Patient received IV fluids and antiemetics - Tolerating oral intake very well - No further IV fluids  2)HFimprEF--- patient with history of chronic systolic dysfunction CHF EF previously 35 to 40% , repeat echo on 04/07/2023 with EF improved to 50%  --overnight on 01/09/24 pt had episode of hypoxia due to volume overload/acute on chronic CHF exacerbation -Chest x-ray consistent with pulmonary edema---due to recent IV fluids for #1 above -Currently on 5 L of oxygen  via nasal cannula -Patient typically uses 3 L per nasal cannula at home -- IV Lasix  as ordered - 3)history of CAD s/p RCA PCI--- no ACS type symptoms - Continue aspirin , Plavix  and atorvastatin  for secondary prevention -Continue metoprolol   4) acute on chronic hypoxic respiratory failure - Worse due to #2 above - Anticipate improvement with diuresis  5)Hypokalemia -Resolved with replacement - Monitor potassium levels with IV diuretics  6)Lactic acidosis with reactive mild leukocytosis - Lactic acid 5.6, 5.8, 3.8 now,  afebrile, normotensive - Patient did receive cefepime  and Flagyl  - Suspect lactic acidosis is due to #1 above with dehydration  7)HTN--stable, continue clonidine , losartan  and metoprolol   8) COPD--- no acute exacerbation, continue  bronchodilators  9)GERD--stable, continue Protonix   10)Depression/Anxiety--- continue Celexa  and Remeron   11)Generalized weakness and deconditioning--therapy eval appreciated recommends home health PT  Status is: Inpatient   Disposition: The patient is from: Home              Anticipated d/c is to: Home              Anticipated d/c date is: 1 day              Patient currently is not medically stable to d/c. Barriers: Not Clinically Stable-   Code Status :  -  Code Status: Full Code   Family Communication:    (patient is alert, awake and coherent)  Discussed with son at bedside   DVT Prophylaxis  :   - SCDs   heparin  injection 5,000 Units Start: 01/07/24 1400 TED hose Start: 01/07/24 0749 SCDs Start: 01/07/24 0749   Lab Results  Component Value Date   PLT 311 01/08/2024    Inpatient Medications  Scheduled Meds:  sodium chloride    Intravenous Once   arformoterol   15 mcg Nebulization BID   And   umeclidinium bromide   1 puff Inhalation Daily   aspirin  EC  81 mg Oral Daily   Chlorhexidine  Gluconate Cloth  6 each Topical Daily   citalopram   20 mg Oral Daily   cloNIDine   0.3 mg Oral BID   clopidogrel   75 mg Oral Daily   folic acid   1 mg Oral Daily   furosemide   20 mg Intravenous Q8H   heparin   5,000 Units Subcutaneous Q8H   losartan   12.5 mg Oral Daily   metoprolol   succinate  50 mg Oral Daily   mirtazapine   7.5 mg Oral QHS   montelukast   10 mg Oral QHS   pantoprazole   40 mg Oral Daily   sodium chloride  flush  3 mL Intravenous Q12H   cyanocobalamin   500 mcg Oral Daily   Continuous Infusions:  metronidazole  500 mg (01/09/24 1242)   PRN Meds:.acetaminophen  **OR** acetaminophen , bisacodyl , hydrALAZINE , HYDROmorphone  (DILAUDID ) injection, ipratropium, levalbuterol , ondansetron  **OR** ondansetron  (ZOFRAN ) IV, oxyCODONE , senna-docusate, sodium chloride  flush, traZODone    Anti-infectives (From admission, onward)    Start     Dose/Rate Route Frequency Ordered Stop    01/06/24 2200  ceFEPIme  (MAXIPIME ) 2 g in sodium chloride  0.9 % 100 mL IVPB        2 g 200 mL/hr over 30 Minutes Intravenous  Once 01/06/24 2149 01/06/24 2303   01/06/24 2200  metroNIDAZOLE  (FLAGYL ) IVPB 500 mg        500 mg 100 mL/hr over 60 Minutes Intravenous Every 12 hours 01/06/24 2149           Subjective: [name] today has no fevers, no emesis,  No chest pain,   - -overnight on 01/09/24 pt had episode of hypoxia due to volume overload/acute on chronic CHF exacerbation -Chest x-ray consistent with pulmonary edema -Currently on 5 L of oxygen  via nasal cannula -Patient typically uses 3 L per nasal cannula at home   Objective: Vitals:   01/09/24 1000 01/09/24 1100 01/09/24 1200 01/09/24 1300  BP: 111/66 (!) 93/38 96/68 (!) 112/50  Pulse: 94 (!) 110 (!) 118   Resp: 13 19 (!) 27 14  Temp:      TempSrc:      SpO2: 100% 100% (!) 71%   Weight:        Intake/Output Summary (Last 24 hours) at 01/09/2024 1448 Last data filed at 01/09/2024 1300 Gross per 24 hour  Intake 939.79 ml  Output --  Net 939.79 ml   Filed Weights   01/07/24 0530 01/08/24 0517 01/09/24 0259  Weight: 58.2 kg 60.1 kg 61.6 kg    Physical Exam  Gen:- Awake Alert, dyspnea exertion but no conversational dyspnea HEENT:- [place].AT, No sclera icterus Neck-Supple Neck,No JVD,.  Nose- [place] 5L/min Lungs-diminished breath sounds with faint bibasilar Rales  CV- S1, S2 normal, regular  Abd-  +ve B.Sounds, Abd Soft, No tenderness,    Extremity/Skin:- No  edema, pedal pulses present  Psych-affect is appropriate, oriented x3 Neuro-generalized weakness, no new focal deficits, no tremors  Data Reviewed: I have personally reviewed following labs and imaging studies  CBC: Recent Labs  Lab 01/06/24 2123 01/08/24 0456 01/08/24 0944 01/08/24 2033  WBC 12.1* 8.3  --   --   HGB 8.8* 6.3* 7.1* 9.6*  HCT 28.4* 20.6* 23.2* 30.3*  MCV 103.3* 103.0*  --   --   PLT 406* 311  --   --    Basic Metabolic  Panel: Recent Labs  Lab 01/06/24 2123 01/07/24 0415 01/07/24 0812 01/07/24 0915 01/08/24 0456 01/09/24 0522  NA 134*  --   --   --  141 138  K 2.4* 3.1*  --   --  3.3* 3.8  CL 87*  --   --   --  105 102  CO2 27  --   --   --  25 24  GLUCOSE 261*  --   --   --  135* 140*  BUN 22  --   --   --  11 7*  CREATININE 1.43*  --   --   --  0.85 0.84  CALCIUM  6.3*  --   --   --  7.2* 7.5*  MG  --   --  0.7* 0.8*  --   --   PHOS  --   --  1.7* 1.7*  --   --    GFR: Estimated Creatinine Clearance: 48.1 mL/min (by C-G formula based on SCr of 0.84 mg/dL). Liver Function Tests: Recent Labs  Lab 01/06/24 2123 01/08/24 0456 01/09/24 0522  AST 100* 38 46*  ALT 32 21 28  ALKPHOS 153* 101 129*  BILITOT 0.9 0.8 0.8  PROT 7.7 5.5* 6.0*  ALBUMIN 2.5* 1.9* 2.2*   Recent Results (from the past 240 hours)  Resp panel by RT-PCR (RSV, Flu A&B, Covid) Anterior Nasal Swab     Status: None   Collection Time: 01/06/24 10:12 PM   Specimen: Anterior Nasal Swab  Result Value Ref Range Status   SARS Coronavirus 2 by RT PCR NEGATIVE NEGATIVE Final    Comment: (NOTE) SARS-CoV-2 target nucleic acids are NOT DETECTED.  The SARS-CoV-2 RNA is generally detectable in upper respiratory specimens during the acute phase of infection. The lowest concentration of SARS-CoV-2 viral copies this assay can detect is 138 copies/mL. A negative result does not preclude SARS-Cov-2 infection and should not be used as the sole basis for treatment or other patient management decisions. A negative result may occur with  improper specimen collection/handling, submission of specimen other than nasopharyngeal swab, presence of viral mutation(s) within the areas targeted by this assay, and inadequate number of viral copies(<138 copies/mL). A negative result must be combined with clinical observations, patient history, and epidemiological information. The expected result is Negative.  Fact Sheet for Patients:   BloggerCourse.com  Fact Sheet for Healthcare Providers:  SeriousBroker.it  This test is no t yet approved or cleared by the [place]  FDA and  has been authorized for detection and/or diagnosis of SARS-CoV-2 by FDA under an Emergency Use Authorization (EUA). This EUA will remain  in effect (meaning this test can be used) for the duration of the COVID-19 declaration under Section 564(b)(1) of the Act, 21 U.S.C.section 360bbb-3(b)(1), unless the authorization is terminated  or revoked sooner.       Influenza A by PCR NEGATIVE NEGATIVE Final   Influenza B by PCR NEGATIVE NEGATIVE Final    Comment: (NOTE) The Xpert Xpress SARS-CoV-2/FLU/RSV plus assay is intended as an aid in the diagnosis of influenza from Nasopharyngeal swab specimens and should not be used as a sole basis for treatment. Nasal washings and aspirates are unacceptable for Xpert Xpress SARS-CoV-2/FLU/RSV testing.  Fact Sheet for Patients: BloggerCourse.com  Fact Sheet for Healthcare Providers: SeriousBroker.it  This test is not yet approved or cleared by the [place]  FDA and has been authorized for detection and/or diagnosis of SARS-CoV-2 by FDA under an Emergency Use Authorization (EUA). This EUA will remain in effect (meaning this test can be used) for the duration of the COVID-19 declaration under Section 564(b)(1) of the Act, 21 U.S.C. section 360bbb-3(b)(1), unless the authorization is terminated or revoked.     Resp Syncytial Virus by PCR NEGATIVE NEGATIVE Final    Comment: (NOTE) Fact Sheet for Patients: BloggerCourse.com  Fact Sheet for Healthcare Providers: SeriousBroker.it  This test is not yet approved or cleared by the [place]  FDA and has been authorized for detection and/or diagnosis of SARS-CoV-2 by FDA under an Emergency Use  Authorization (EUA). This EUA will remain in effect (meaning this test can be used) for the  duration of the COVID-19 declaration under Section 564(b)(1) of the Act, 21 U.S.C. section 360bbb-3(b)(1), unless the authorization is terminated or revoked.  Performed at [facility], [address] [place], [place] [address]   Blood culture (routine x 2)     Status: Abnormal (Preliminary result)   Collection Time: 01/06/24 10:20 PM   Specimen: BLOOD  Result Value Ref Range Status   Specimen Description   Final    BLOOD BLOOD LEFT ARM Performed at [facility], [address], [place], [place] [address]    Special Requests   Final    BOTTLES DRAWN AEROBIC AND ANAEROBIC Blood Culture adequate volume Performed at [facility], [address] [place], [place] [address]    Culture  Setup Time   Final    GRAM POSITIVE COCCI ANAEROBIC BOTTLE ONLY CRITICAL RESULT CALLED TO, READ BACK BY AND VERIFIED WITH: ANGIE R. ON 01/07/2024 @10 :41PM BY T.HAMER CRITICAL RESULT CALLED TO, READ BACK BY AND VERIFIED WITH: RN TERESA FRENCH [id number] 1306 BY J RAZZAK, MT    Culture (A)  Final    STAPHYLOCOCCUS EPIDERMIDIS THE SIGNIFICANCE OF ISOLATING THIS ORGANISM FROM A SINGLE SET OF BLOOD CULTURES WHEN MULTIPLE SETS ARE DRAWN IS UNCERTAIN. PLEASE NOTIFY THE MICROBIOLOGY DEPARTMENT WITHIN ONE WEEK IF SPECIATION AND SENSITIVITIES ARE REQUIRED. Performed at [facility], [address], [place], [place] [address]    Report Status PENDING  Incomplete  Blood culture (routine x 2)     Status: None (Preliminary result)   Collection Time: 01/06/24 10:20 PM   Specimen: BLOOD  Result Value Ref Range Status   Specimen Description BLOOD BLOOD RIGHT HAND  Final   Special Requests   Final    BOTTLES DRAWN AEROBIC AND ANAEROBIC Blood Culture adequate volume   Culture   Final    NO GROWTH 3 DAYS Performed at [facility], [address], [place], [place] [address]    Report Status PENDING  Incomplete  Blood Culture  ID Panel (Reflexed)     Status: Abnormal   Collection Time: 01/06/24 10:20 PM  Result Value Ref Range Status   Enterococcus faecalis NOT DETECTED NOT DETECTED Final   Enterococcus Faecium NOT DETECTED NOT DETECTED Final   Listeria monocytogenes NOT DETECTED NOT DETECTED Final   Staphylococcus species DETECTED (A) NOT DETECTED Final    Comment: CRITICAL RESULT CALLED TO, READ BACK BY AND VERIFIED WITH: RN TERESA FRENCH [id number] 1306 BY J RAZZAK MT    Staphylococcus aureus (BCID) NOT DETECTED NOT DETECTED Final   Staphylococcus epidermidis DETECTED (A) NOT DETECTED Final    Comment: CRITICAL RESULT CALLED TO, READ BACK BY AND VERIFIED WITH: RN TERESA FRENCH [id number] 1306 BY J RAZZAK MT    Staphylococcus lugdunensis NOT DETECTED NOT DETECTED Final   Streptococcus species NOT DETECTED NOT DETECTED Final   Streptococcus agalactiae NOT DETECTED NOT DETECTED Final   Streptococcus pneumoniae NOT DETECTED NOT DETECTED Final   Streptococcus pyogenes NOT DETECTED NOT DETECTED Final   A.calcoaceticus-baumannii NOT DETECTED NOT DETECTED Final   Bacteroides fragilis NOT DETECTED NOT DETECTED Final   Enterobacterales NOT DETECTED NOT DETECTED Final   Enterobacter cloacae complex NOT DETECTED NOT DETECTED Final   Escherichia coli NOT DETECTED NOT DETECTED Final   Klebsiella aerogenes NOT DETECTED NOT DETECTED Final   Klebsiella oxytoca NOT DETECTED NOT DETECTED Final   Klebsiella pneumoniae NOT DETECTED NOT DETECTED Final   Proteus species NOT DETECTED NOT DETECTED Final   Salmonella species NOT DETECTED NOT DETECTED Final  Serratia marcescens NOT DETECTED NOT DETECTED Final   Haemophilus influenzae NOT DETECTED NOT DETECTED Final   Neisseria meningitidis NOT DETECTED NOT DETECTED Final   Pseudomonas aeruginosa NOT DETECTED NOT DETECTED Final   Stenotrophomonas maltophilia NOT DETECTED NOT DETECTED Final   Candida albicans NOT DETECTED NOT DETECTED Final   Candida auris NOT DETECTED NOT DETECTED  Final   Candida glabrata NOT DETECTED NOT DETECTED Final   Candida krusei NOT DETECTED NOT DETECTED Final   Candida parapsilosis NOT DETECTED NOT DETECTED Final   Candida tropicalis NOT DETECTED NOT DETECTED Final   Cryptococcus neoformans/gattii NOT DETECTED NOT DETECTED Final   Methicillin resistance mecA/C NOT DETECTED NOT DETECTED Final    Comment: Performed at [facility], [address] [place], [place] [address]    Radiology Studies: DG Chest 1 View Result Date: 01/08/2024 CLINICAL DATA:  Shortness of breath EXAM: CHEST  1 VIEW COMPARISON:  01/06/2024 FINDINGS: Stable cardiomediastinal silhouette. Chronic bronchitic change and hyperinflation. Bullous change in the right upper lobe. Interstitial coarsening has increased compared to 01/06/2024. No pleural effusion or pneumothorax. IMPRESSION: Increased interstitial coarsening compared to 01/06/2024 which may be due to edema or infection superimposed on a background of advanced emphysema. Electronically Signed   By: Rozell Cornet M.D.   On: 01/08/2024 21:13   Scheduled Meds:  sodium chloride    Intravenous Once   arformoterol   15 mcg Nebulization BID   And   umeclidinium bromide   1 puff Inhalation Daily   aspirin  EC  81 mg Oral Daily   Chlorhexidine  Gluconate Cloth  6 each Topical Daily   citalopram   20 mg Oral Daily   cloNIDine   0.3 mg Oral BID   clopidogrel   75 mg Oral Daily   folic acid   1 mg Oral Daily   furosemide   20 mg Intravenous Q8H   heparin   5,000 Units Subcutaneous Q8H   losartan   12.5 mg Oral Daily   metoprolol  succinate  50 mg Oral Daily   mirtazapine   7.5 mg Oral QHS   montelukast   10 mg Oral QHS   pantoprazole   40 mg Oral Daily   sodium chloride  flush  3 mL Intravenous Q12H   cyanocobalamin   500 mcg Oral Daily   Continuous Infusions:  metronidazole  500 mg (01/09/24 1242)     LOS: 2 days    Colin Dawley M.D on 01/09/2024 at 2:48 PM  Go to [email or web address] - for contact info  [facility] -  Office  [phone]  If 7PM-7AM, please contact night-coverage [email or web address] 01/09/2024, 2:48 PM

## 2024-01-09 NOTE — Progress Notes (Signed)
 PT Cancellation Note  Patient Details Name: [name] MRN: [id number] DOB: 06-06-1951   Cancelled Treatment:    Reason Eval/Treat Not Completed: Medical issues which prohibited therapy. Patient transferred to a higher level of care and will need new PT consult to resume therapy when patient is medically stable.  Thank you.   8:35 AM, 01/09/24 Walton Guppy, MPT Physical Therapist with [facility] [phone] office [phone] mobile phone

## 2024-01-09 NOTE — Progress Notes (Signed)
 Provider notified that patient is struggling with her breathing and has had no urine output since receiving Lasix  around 2000 this evening.  Patient continues to have confusion and is very dyspnic on exertion.  Also, patient's son, Lavonia Powers called while this RN was in the room and I spoke with the son explaining that she was struggling some and that I wanted her to focus on her breathing.  Provider came up and saw the patient and wanted her moved to ICU and placed on Bipap.  Patient was transferred to ICU bed 9.

## 2024-01-09 NOTE — Plan of Care (Signed)
  Problem: Education: Goal: Knowledge of General Education information will improve Description: Including pain rating scale, medication(s)/side effects and non-pharmacologic comfort measures Outcome: Progressing   Problem: Coping: Goal: Level of anxiety will decrease Outcome: Progressing   Problem: Elimination: Goal: Will not experience complications related to urinary retention Outcome: Progressing   Problem: Pain Managment: Goal: General experience of comfort will improve and/or be controlled Outcome: Progressing   Problem: Safety: Goal: Ability to remain free from injury will improve Outcome: Progressing   Problem: Skin Integrity: Goal: Risk for impaired skin integrity will decrease Outcome: Progressing   Problem: Clinical Measurements: Goal: Diagnostic test results will improve Outcome: Not Progressing Goal: Respiratory complications will improve Outcome: Not Progressing   Problem: Activity: Goal: Risk for activity intolerance will decrease Outcome: Not Progressing

## 2024-01-09 NOTE — Progress Notes (Addendum)
 Critical Note  RN called due to patient having respiratory difficulty after using the bathroom.  RN was concerned of fluid overload due to patient having 500 mL of clear falls and 1 unit of blood. Chest x-ray was done and it showed increased interstitial coarsening compared to 01/06/2024 which may be due to edema or infection superimposed on a background of advanced emphysema.  IV Lasix  20 mg x 1 was given, BiPAP was provided and patient was transferred to stepdown unit for closer monitoring.  Assessment and plan  Acute pulmonary edema IV Lasix  20 mg x 1 was given Consider further diuresis if BP permits  Acute on chronic respiratory failure with hypoxia requiring NIPPV Continue BiPAP and wean patient to baseline as tolerated  Critical time: 35 minutes   Critical care personally provided  managing the patient due to high probability of clinically significant and life threatening deterioration. This critical care time included obtaining a history; examining the patient, pulse oximetry; ordering and review of studies; arranging urgent treatment with development of a management plan; evaluation of patient's response of treatment; frequent reassessment; and discussions with other providers.  This critical care time was performed to assess and manage the high probability of imminent and life threatening deterioration that could result in multi-organ failure.  Please refer to admission H&P and progress notes regarding the care of this patient

## 2024-01-09 NOTE — Evaluation (Signed)
 Physical Therapy Evaluation Patient Details Name: [name] MRN: [id number] DOB: [birth year] Today's Date: 01/09/2024  History of Present Illness  [name] is a 61 yoM PMH of hypertension, chronic systolic heart failure, depression, CVA, anxiety, and COPD with chronic hypoxemia on 3 L nasal cannula oxygen - who presents to the emergency department due to  N/V x3 days was unable to keep anything down. Also c/o right sided flank pain. Similar complaint of hospitalization for intractable nausea and vomiting admitted 3/4 - 3/5   Clinical Impression  Patient had to transfer to ICU due to breathing difficulty, but overall no significant change in functional mobility since last visit. Patient demonstrates slow labored movement for sitting up at bedside, continues to need RW for taking steps due to poor standing balance, good return for transferring to/from [facility] and tolerated sitting up in chair after therapy. Patient will benefit from continued skilled physical therapy in hospital and recommended venue below to increase strength, balance, endurance for safe ADLs and gait.        If plan is discharge home, recommend the following: A little help with walking and/or transfers;A little help with bathing/dressing/bathroom;Help with stairs or ramp for entrance;Assistance with cooking/housework   Can travel by private vehicle        Equipment Recommendations None recommended by PT  Recommendations for Other Services       Functional Status Assessment Patient has had a recent decline in their functional status and demonstrates the ability to make significant improvements in function in a reasonable and predictable amount of time.     Precautions / Restrictions Precautions Precautions: Fall Recall of Precautions/Restrictions: Intact Restrictions Weight Bearing Restrictions Per Provider Order: No      Mobility  Bed Mobility Overal bed mobility: Needs Assistance Bed Mobility: Supine  to Sit     Supine to sit: Supervision     General bed mobility comments: increased time with slightly labored movement    Transfers Overall transfer level: Needs assistance Equipment used: Rolling walker (2 wheels) Transfers: Sit to/from Stand, Bed to chair/wheelchair/BSC Sit to Stand: Contact guard assist   Step pivot transfers: Contact guard assist       General transfer comment: has to lean on armrest of chair for support when not using an AD, safer using RW    Ambulation/Gait Ambulation/Gait assistance: Supervision, Contact guard assist Gait Distance (Feet): 15 Feet Assistive device: Rolling walker (2 wheels) Gait Pattern/deviations: Decreased step length - right, Decreased step length - left, Decreased stride length Gait velocity: decreased     General Gait Details: fair/good return for ambulating in room with slow labored movement, no loss of balace, limited mostly due to fatigue while on 3 LPM O2 with SpO2 at 95%  Stairs            Wheelchair Mobility     Tilt Bed    Modified Rankin (Stroke Patients Only)       Balance Overall balance assessment: Needs assistance Sitting-balance support: Feet supported, No upper extremity supported Sitting balance-Leahy Scale: Good Sitting balance - Comments: seated at EOB   Standing balance support: During functional activity, No upper extremity supported Standing balance-Leahy Scale: Poor Standing balance comment: fair/good using RW                             Pertinent Vitals/Pain Pain Assessment Pain Assessment: No/denies pain    Home Living Family/patient expects to be discharged to:: Private residence  Living Arrangements: Children Available Help at Discharge: Family;Available 24 hours/day Type of Home: Mobile home Home Access: Stairs to enter Entrance Stairs-Rails: Can reach both;Right;Left Entrance Stairs-Number of Steps: 5   Home Layout: One level Home Equipment: Rolling Walker (2  wheels);BSC/3in1;Grab bars - toilet;Grab bars - tub/shower;Shower seat      Prior Function Prior Level of Function : Independent/Modified Independent;Driving             Mobility Comments: Community ambulation without AD, drives ADLs Comments: PLOF independent     Extremity/Trunk Assessment   Upper Extremity Assessment Upper Extremity Assessment: Generalized weakness    Lower Extremity Assessment Lower Extremity Assessment: Generalized weakness    Cervical / Trunk Assessment Cervical / Trunk Assessment: Normal  Communication   Communication Communication: No apparent difficulties    Cognition Arousal: Alert Behavior During Therapy: WFL for tasks assessed/performed   PT - Cognitive impairments: No apparent impairments                         Following commands: Intact       Cueing Cueing Techniques: Verbal cues     General Comments      Exercises     Assessment/Plan    PT Assessment Patient needs continued PT services  PT Problem List Decreased strength;Decreased activity tolerance;Decreased balance;Decreased mobility;Pain       PT Treatment Interventions DME instruction;Gait training;Stair training;Functional mobility training;Therapeutic activities;Therapeutic exercise;Balance training;Patient/family education    PT Goals (Current goals can be found in the Care Plan section)  Acute Rehab PT Goals Patient Stated Goal: return home with family to assist PT Goal Formulation: With patient Time For Goal Achievement: 01/16/24 Potential to Achieve Goals: Good    Frequency Min 3X/week     Co-evaluation               AM-PAC PT "6 Clicks" Mobility  Outcome Measure Help needed turning from your back to your side while in a flat bed without using bedrails?: None Help needed moving from lying on your back to sitting on the side of a flat bed without using bedrails?: None Help needed moving to and from a bed to a chair (including a  wheelchair)?: A Little Help needed standing up from a chair using your arms (e.g., wheelchair or bedside chair)?: A Little Help needed to walk in hospital room?: A Little Help needed climbing 3-5 steps with a railing? : A Lot 6 Click Score: 19    End of Session Equipment Utilized During Treatment: Oxygen  Activity Tolerance: Patient tolerated treatment well;Patient limited by fatigue Patient left: in chair;with call bell/phone within reach Nurse Communication: Mobility status PT Visit Diagnosis: Unsteadiness on feet (R26.81);Other abnormalities of gait and mobility (R26.89);Muscle weakness (generalized) (M62.81)    Time: 9147-8295 PT Time Calculation (min) (ACUTE ONLY): 32 min   Charges:   PT Evaluation $PT Eval Moderate Complexity: 1 Mod PT Treatments $Therapeutic Activity: 23-37 mins PT General Charges $$ ACUTE PT VISIT: 1 Visit         12:34 PM, 01/09/24 Walton Guppy, MPT Physical Therapist with [facility] [phone] office [phone] mobile phone

## 2024-01-10 ENCOUNTER — Inpatient Hospital Stay (HOSPITAL_COMMUNITY)

## 2024-01-10 DIAGNOSIS — R112 Nausea with vomiting, unspecified: Secondary | ICD-10-CM | POA: Diagnosis not present

## 2024-01-10 LAB — COMPREHENSIVE METABOLIC PANEL WITH GFR
ALT: 22 U/L (ref 0–44)
AST: 31 U/L (ref 15–41)
Albumin: 2.1 g/dL — ABNORMAL LOW (ref 3.5–5.0)
Alkaline Phosphatase: 121 U/L (ref 38–126)
Anion gap: 10 (ref 5–15)
BUN: 7 mg/dL — ABNORMAL LOW (ref 8–23)
CO2: 23 mmol/L (ref 22–32)
Calcium: 7.5 mg/dL — ABNORMAL LOW (ref 8.9–10.3)
Chloride: 106 mmol/L (ref 98–111)
Creatinine, Ser: 1.1 mg/dL — ABNORMAL HIGH (ref 0.44–1.00)
GFR, Estimated: 53 mL/min — ABNORMAL LOW (ref >60)
Glucose, Bld: 171 mg/dL — ABNORMAL HIGH (ref 70–99)
Potassium: 3.5 mmol/L (ref 3.5–5.1)
Sodium: 139 mmol/L (ref 135–145)
Total Bilirubin: 0.8 mg/dL (ref 0.0–1.2)
Total Protein: 5.8 g/dL — ABNORMAL LOW (ref 6.5–8.1)

## 2024-01-10 LAB — CBC
HCT: 27.5 % — ABNORMAL LOW (ref 36.0–46.0)
Hemoglobin: 8.9 g/dL — ABNORMAL LOW (ref 12.0–15.0)
MCH: 32.4 pg (ref 26.0–34.0)
MCHC: 32.4 g/dL (ref 30.0–36.0)
MCV: 100 fL (ref 80.0–100.0)
Platelets: 323 10*3/uL (ref 150–400)
RBC: 2.75 MIL/uL — ABNORMAL LOW (ref 3.87–5.11)
RDW: 15.1 % (ref 11.5–15.5)
WBC: 11.5 10*3/uL — ABNORMAL HIGH (ref 4.0–10.5)
nRBC: 0.3 % — ABNORMAL HIGH (ref 0.0–0.2)

## 2024-01-10 LAB — CULTURE, BLOOD (ROUTINE X 2): Special Requests: ADEQUATE

## 2024-01-10 LAB — RENAL FUNCTION PANEL
Albumin: 2.1 g/dL — ABNORMAL LOW (ref 3.5–5.0)
Anion gap: 11 (ref 5–15)
BUN: 7 mg/dL — ABNORMAL LOW (ref 8–23)
CO2: 22 mmol/L (ref 22–32)
Calcium: 7.4 mg/dL — ABNORMAL LOW (ref 8.9–10.3)
Chloride: 105 mmol/L (ref 98–111)
Creatinine, Ser: 1.16 mg/dL — ABNORMAL HIGH (ref 0.44–1.00)
GFR, Estimated: 49 mL/min — ABNORMAL LOW (ref >60)
Glucose, Bld: 169 mg/dL — ABNORMAL HIGH (ref 70–99)
Phosphorus: 3 mg/dL (ref 2.5–4.6)
Potassium: 3.5 mmol/L (ref 3.5–5.1)
Sodium: 138 mmol/L (ref 135–145)

## 2024-01-10 LAB — GLUCOSE, CAPILLARY: Glucose-Capillary: 161 mg/dL — ABNORMAL HIGH (ref 70–99)

## 2024-01-10 MED ORDER — METHYLPREDNISOLONE SODIUM SUCC 40 MG IJ SOLR
40.0000 mg | Freq: Two times a day (BID) | INTRAMUSCULAR | Status: DC
  Administered 2024-01-10 – 2024-01-11 (×2): 40 mg via INTRAVENOUS
  Filled 2024-01-10 (×2): qty 1

## 2024-01-10 MED ORDER — FUROSEMIDE 40 MG PO TABS: 40.0000 mg | ORAL_TABLET | Freq: Every day | ORAL | Status: DC

## 2024-01-10 MED ORDER — IPRATROPIUM BROMIDE 0.02 % IN SOLN
RESPIRATORY_TRACT | Status: AC
  Administered 2024-01-10: 0.5 mg
  Filled 2024-01-10: qty 2.5

## 2024-01-10 MED ORDER — POTASSIUM CHLORIDE CRYS ER 20 MEQ PO TBCR
40.0000 meq | EXTENDED_RELEASE_TABLET | Freq: Once | ORAL | Status: AC
  Administered 2024-01-10: 40 meq via ORAL
  Filled 2024-01-10: qty 2

## 2024-01-10 MED ORDER — AZITHROMYCIN 250 MG PO TABS
500.0000 mg | ORAL_TABLET | Freq: Every day | ORAL | Status: DC
  Administered 2024-01-10 – 2024-01-14 (×5): 500 mg via ORAL
  Filled 2024-01-10 (×5): qty 2

## 2024-01-10 MED ORDER — DM-GUAIFENESIN ER 30-600 MG PO TB12
1.0000 | ORAL_TABLET | Freq: Two times a day (BID) | ORAL | Status: DC
  Administered 2024-01-10 – 2024-01-12 (×4): 1 via ORAL
  Filled 2024-01-10 (×6): qty 1

## 2024-01-10 MED ORDER — MIDODRINE HCL 5 MG PO TABS
10.0000 mg | ORAL_TABLET | Freq: Three times a day (TID) | ORAL | Status: DC
  Administered 2024-01-10 – 2024-01-11 (×2): 10 mg via ORAL
  Filled 2024-01-10 (×2): qty 2

## 2024-01-10 NOTE — Plan of Care (Signed)

## 2024-01-10 NOTE — Progress Notes (Addendum)
 PROGRESS NOTE  [name], is a 73 y.o. female, DOB - 09-Jul-1951, NFA:[id number]  Admit date - 01/06/2024   Admitting Physician Twilla Galea, DO  Outpatient Primary MD for the patient is [facility]  LOS - 3  Chief Complaint  Patient presents with   Nausea      Brief Narrative:  BATUL DIEGO is a 73 yoM PMH of hypertension, chronic systolic heart failure, depression, CVA, anxiety, and COPD with chronic hypoxemia on 3 L nasal cannula oxygen  admitted on 01/07/24 with intractable emesis and right flank pain presumed related to recent rib fractures from recent fall   -Assessment and Plan: 1)Intractable nausea and vomiting - Mostly resolved - Patient received IV fluids and antiemetics - Tolerating oral intake well - No further IV fluids  2)HFimprEF--- patient with history of chronic systolic dysfunction CHF EF previously 35 to 40% , repeat echo on 04/07/2023 with EF improved to 50%  --overnight on 01/09/24 pt had episode of hypoxia due to volume overload/acute on chronic CHF exacerbation -Chest x-ray on 01/08/24 consistent with pulmonary edema---due to recent IV fluids for #1 above -Currently on 5 to 6  L of oxygen  via nasal cannula -Patient typically uses 3 L per nasal cannula at home -- After IV Lasix , repeat chest x-ray on 01/10/2024 without pulmonary edema, chest x-ray is now mostly consistent with COPD/emphysema  - Stop further IV Lasix  and Give diuretic holiday due to soft BP -Consider restarting Lasix  orally in the a.m.  3)history of CAD s/p RCA PCI--- no ACS type symptoms - Continue aspirin , Plavix  and atorvastatin  for secondary prevention -Continue metoprolol   4) acute COPD exacerbation--- repeat chest x-ray on 01/10/2024 consistent with COPD/emphysema -Overnight on 01/10/24 pt had another episode of hypoxia, tachycardia (HR 140s) and Hypotension 80/40s---required Bipap, nebulizer Rx and midodrine  overnight, -- currently on 5 to 6L/min -- Son at bedside  this am------questions answered -Patient typically uses 3 L per nasal cannula at home - Give IV Solu-Medrol , mucolytics and azithromycin   5)Acute on chronic hypoxic respiratory failure - Worse due to #2 and #4 above -Please see #4 above - Anticipate improvement with above measures  6)Lactic acidosis with reactive mild leukocytosis - Lactic acid 5.6, 5.8, 3.8  afebrile, normotensive - Patient did receive cefepime  and Flagyl  - Suspect lactic acidosis is due to #1 above with dehydration - No evidence of sepsis, no further antibiotics indicated  7)HTN--stable, continue clonidine , losartan  and metoprolol   8) COPD--- no acute exacerbation, continue bronchodilators  9)GERD--stable, continue Protonix   10)Depression/Anxiety--- continue Celexa  and Remeron   11)Generalized weakness and deconditioning--therapy eval appreciated recommends home health PT  12)Hypokalemia -Resolved with replacement\  13) Staph epi bacteremia--blood cultures from 01/06/2024 with staph epi, suspect this is a  contaminant,  14) hypotension--stop clonidine , hold IV Lasix , hold metoprolol  and losartan , -Give midodrine    Status is: Inpatient   Disposition: The patient is from: Home              Anticipated d/c is to: Home              Anticipated d/c date is: 1 day              Patient currently is not medically stable to d/c. Barriers: Not Clinically Stable-   Code Status :  -  Code Status: Full Code   Family Communication:    (patient is alert, awake and coherent)  Discussed with son at bedside   DVT Prophylaxis  :   - SCDs   heparin   injection 5,000 Units Start: 01/07/24 1400 TED hose Start: 01/07/24 0749 SCDs Start: 01/07/24 0749   Lab Results  Component Value Date   PLT 323 01/10/2024    Inpatient Medications  Scheduled Meds:  sodium chloride    Intravenous Once   arformoterol   15 mcg Nebulization BID   And   umeclidinium bromide   1 puff Inhalation Daily   aspirin  EC  81 mg Oral Daily    atorvastatin   20 mg Oral Daily   azithromycin   500 mg Oral Daily   Chlorhexidine  Gluconate Cloth  6 each Topical Daily   citalopram   20 mg Oral Daily   cloNIDine   0.3 mg Oral BID   clopidogrel   75 mg Oral Daily   dextromethorphan -guaiFENesin   1 tablet Oral BID   folic acid   1 mg Oral Daily   furosemide   20 mg Intravenous Q8H   heparin   5,000 Units Subcutaneous Q8H   losartan   12.5 mg Oral Daily   methylPREDNISolone  (SOLU-MEDROL ) injection  40 mg Intravenous Q12H   metoprolol  succinate  50 mg Oral Daily   mirtazapine   7.5 mg Oral QHS   montelukast   10 mg Oral QHS   mupirocin  ointment   Nasal BID   pantoprazole   40 mg Oral Daily   sodium chloride  flush  3 mL Intravenous Q12H   cyanocobalamin   500 mcg Oral Daily   Continuous Infusions:   PRN Meds:.acetaminophen  **OR** acetaminophen , bisacodyl , hydrALAZINE , HYDROmorphone  (DILAUDID ) injection, ipratropium, levalbuterol , ondansetron  **OR** ondansetron  (ZOFRAN ) IV, oxyCODONE , senna-docusate, sodium chloride  flush, traZODone    Anti-infectives (From admission, onward)    Start     Dose/Rate Route Frequency Ordered Stop   01/10/24 1330  azithromycin  (ZITHROMAX ) tablet 500 mg        500 mg Oral Daily 01/10/24 1231     01/06/24 2200  ceFEPIme  (MAXIPIME ) 2 g in sodium chloride  0.9 % 100 mL IVPB        2 g 200 mL/hr over 30 Minutes Intravenous  Once 01/06/24 2149 01/06/24 2303   01/06/24 2200  metroNIDAZOLE  (FLAGYL ) IVPB 500 mg  Status:  Discontinued        500 mg 100 mL/hr over 60 Minutes Intravenous Every 12 hours 01/06/24 2149 01/10/24 1056       Subjective: Madelyn Schick today has no fevers, no emesis,  No chest pain,   - -Overnight on 01/10/24 pt had another episode of hypoxia, tachycardia (HR 140s) and Hypotension 80/40s---required Bipap, nebulizer Rx and midodrine  overnight, -- currently on 5 to 6L/min -- Son at bedside this am------questions answered -Patient typically uses 3 L per nasal cannula at  home   Objective: Vitals:   01/10/24 0807 01/10/24 0859 01/10/24 1000 01/10/24 1151  BP:  113/78 (!) 103/51   Pulse:  (!) 110 85   Resp:   (!) 21   Temp:    98 F (36.7 C)  TempSrc:    Oral  SpO2: 100%  100%   Weight:        Intake/Output Summary (Last 24 hours) at 01/10/2024 1232 Last data filed at 01/10/2024 1191 Gross per 24 hour  Intake 835.86 ml  Output 400 ml  Net 435.86 ml   Filed Weights   01/08/24 0517 01/09/24 0259 01/10/24 0625  Weight: 60.1 kg 61.6 kg 61.9 kg   Physical Exam Gen:- Awake Alert, dyspnea exertion but no conversational dyspnea HEENT:- [place].AT, No sclera icterus Neck-Supple Neck,No JVD,.  Nose- [place] 5 to 6L/min Lungs-improving air movement, No wheezing CV- S1, S2 normal, regular  Abd-  +  ve B.Sounds, Abd Soft, No tenderness,    Extremity/Skin:- No  edema, pedal pulses present  Psych-affect is appropriate, oriented x3 Neuro-Generalized weakness, no new focal deficits, no tremors  Data Reviewed: I have personally reviewed following labs and imaging studies  CBC: Recent Labs  Lab 01/06/24 2123 01/08/24 0456 01/08/24 0944 01/08/24 2033 01/10/24 0453  WBC 12.1* 8.3  --   --  11.5*  HGB 8.8* 6.3* 7.1* 9.6* 8.9*  HCT 28.4* 20.6* 23.2* 30.3* 27.5*  MCV 103.3* 103.0*  --   --  100.0  PLT 406* 311  --   --  323   Basic Metabolic Panel: Recent Labs  Lab 01/06/24 2123 01/07/24 0415 01/07/24 0812 01/07/24 0915 01/08/24 0456 01/09/24 0522 01/10/24 0453  NA 134*  --   --   --  141 138 138  139  K 2.4* 3.1*  --   --  3.3* 3.8 3.5  3.5  CL 87*  --   --   --  105 102 105  106  CO2 27  --   --   --  25 24 22  23   GLUCOSE 261*  --   --   --  135* 140* 169*  171*  BUN 22  --   --   --  11 7* 7*  7*  CREATININE 1.43*  --   --   --  0.85 0.84 1.16*  1.10*  CALCIUM  6.3*  --   --   --  7.2* 7.5* 7.4*  7.5*  MG  --   --  0.7* 0.8*  --   --   --   PHOS  --   --  1.7* 1.7*  --   --  3.0   GFR: Estimated Creatinine Clearance: 36.9 mL/min (A)  (by C-G formula based on SCr of 1.1 mg/dL (H)). Liver Function Tests: Recent Labs  Lab 01/06/24 2123 01/08/24 0456 01/09/24 0522 01/10/24 0453  AST 100* 38 46* 31  ALT 32 21 28 22   ALKPHOS 153* 101 129* 121  BILITOT 0.9 0.8 0.8 0.8  PROT 7.7 5.5* 6.0* 5.8*  ALBUMIN 2.5* 1.9* 2.2* 2.1*  2.1*   Recent Results (from the past 240 hours)  Resp panel by RT-PCR (RSV, Flu A&B, Covid) Anterior Nasal Swab     Status: None   Collection Time: 01/06/24 10:12 PM   Specimen: Anterior Nasal Swab  Result Value Ref Range Status   SARS Coronavirus 2 by RT PCR NEGATIVE NEGATIVE Final    Comment: (NOTE) SARS-CoV-2 target nucleic acids are NOT DETECTED.  The SARS-CoV-2 RNA is generally detectable in upper respiratory specimens during the acute phase of infection. The lowest concentration of SARS-CoV-2 viral copies this assay can detect is 138 copies/mL. A negative result does not preclude SARS-Cov-2 infection and should not be used as the sole basis for treatment or other patient management decisions. A negative result may occur with  improper specimen collection/handling, submission of specimen other than nasopharyngeal swab, presence of viral mutation(s) within the areas targeted by this assay, and inadequate number of viral copies(<138 copies/mL). A negative result must be combined with clinical observations, patient history, and epidemiological information. The expected result is Negative.  Fact Sheet for Patients:  BloggerCourse.com  Fact Sheet for Healthcare Providers:  SeriousBroker.it  This test is no t yet approved or cleared by the [place]  FDA and  has been authorized for detection and/or diagnosis of SARS-CoV-2 by FDA under an Emergency Use Authorization (EUA). This EUA  will remain  in effect (meaning this test can be used) for the duration of the COVID-19 declaration under Section 564(b)(1) of the Act, 21 U.S.C.section  360bbb-3(b)(1), unless the authorization is terminated  or revoked sooner.       Influenza A by PCR NEGATIVE NEGATIVE Final   Influenza B by PCR NEGATIVE NEGATIVE Final    Comment: (NOTE) The Xpert Xpress SARS-CoV-2/FLU/RSV plus assay is intended as an aid in the diagnosis of influenza from Nasopharyngeal swab specimens and should not be used as a sole basis for treatment. Nasal washings and aspirates are unacceptable for Xpert Xpress SARS-CoV-2/FLU/RSV testing.  Fact Sheet for Patients: BloggerCourse.com  Fact Sheet for Healthcare Providers: SeriousBroker.it  This test is not yet approved or cleared by the [place]  FDA and has been authorized for detection and/or diagnosis of SARS-CoV-2 by FDA under an Emergency Use Authorization (EUA). This EUA will remain in effect (meaning this test can be used) for the duration of the COVID-19 declaration under Section 564(b)(1) of the Act, 21 U.S.C. section 360bbb-3(b)(1), unless the authorization is terminated or revoked.     Resp Syncytial Virus by PCR NEGATIVE NEGATIVE Final    Comment: (NOTE) Fact Sheet for Patients: BloggerCourse.com  Fact Sheet for Healthcare Providers: SeriousBroker.it  This test is not yet approved or cleared by the [place]  FDA and has been authorized for detection and/or diagnosis of SARS-CoV-2 by FDA under an Emergency Use Authorization (EUA). This EUA will remain in effect (meaning this test can be used) for the duration of the COVID-19 declaration under Section 564(b)(1) of the Act, 21 U.S.C. section 360bbb-3(b)(1), unless the authorization is terminated or revoked.  Performed at [facility], [address], [place], [place] [address]   Blood culture (routine x 2)     Status: Abnormal   Collection Time: 01/06/24 10:20 PM   Specimen: BLOOD  Result Value Ref Range Status   Specimen Description    Final    BLOOD BLOOD LEFT ARM Performed at [facility], [address], [place], [place] [address]    Special Requests   Final    BOTTLES DRAWN AEROBIC AND ANAEROBIC Blood Culture adequate volume Performed at [facility], [address], [place], [place] [address]    Culture  Setup Time   Final    GRAM POSITIVE COCCI ANAEROBIC BOTTLE ONLY CRITICAL RESULT CALLED TO, READ BACK BY AND VERIFIED WITH: ANGIE R. ON 01/07/2024 @10 :41PM BY T.HAMER CRITICAL RESULT CALLED TO, READ BACK BY AND VERIFIED WITH: RN TERESA FRENCH [id number] [id number] BY J RAZZAK, MT    Culture (A)  Final    STAPHYLOCOCCUS EPIDERMIDIS THE SIGNIFICANCE OF ISOLATING THIS ORGANISM FROM A SINGLE SET OF BLOOD CULTURES WHEN MULTIPLE SETS ARE DRAWN IS UNCERTAIN. PLEASE NOTIFY THE MICROBIOLOGY DEPARTMENT WITHIN ONE WEEK IF SPECIATION AND SENSITIVITIES ARE REQUIRED. Performed at [facility], [address] [place], [place] [address]    Report Status 01/10/2024 FINAL  Final  Blood culture (routine x 2)     Status: None (Preliminary result)   Collection Time: 01/06/24 10:20 PM   Specimen: BLOOD  Result Value Ref Range Status   Specimen Description BLOOD BLOOD RIGHT HAND  Final   Special Requests   Final    BOTTLES DRAWN AEROBIC AND ANAEROBIC Blood Culture adequate volume   Culture   Final    NO GROWTH 4 DAYS Performed at [facility], [address] [place], [place] [address]    Report Status PENDING  Incomplete  Blood  Culture ID Panel (Reflexed)     Status: Abnormal   Collection Time: 01/06/24 10:20 PM  Result Value Ref Range Status   Enterococcus faecalis NOT DETECTED NOT DETECTED Final   Enterococcus Faecium NOT DETECTED NOT DETECTED Final   Listeria monocytogenes NOT DETECTED NOT DETECTED Final   Staphylococcus species DETECTED (A) NOT DETECTED Final    Comment: CRITICAL RESULT CALLED TO, READ BACK BY AND VERIFIED WITH: RN TERESA FRENCH [id number] 1306 BY J RAZZAK MT    Staphylococcus aureus (BCID) NOT DETECTED NOT  DETECTED Final   Staphylococcus epidermidis DETECTED (A) NOT DETECTED Final    Comment: CRITICAL RESULT CALLED TO, READ BACK BY AND VERIFIED WITH: RN TERESA FRENCH [id number] 1306 BY J RAZZAK MT    Staphylococcus lugdunensis NOT DETECTED NOT DETECTED Final   Streptococcus species NOT DETECTED NOT DETECTED Final   Streptococcus agalactiae NOT DETECTED NOT DETECTED Final   Streptococcus pneumoniae NOT DETECTED NOT DETECTED Final   Streptococcus pyogenes NOT DETECTED NOT DETECTED Final   A.calcoaceticus-baumannii NOT DETECTED NOT DETECTED Final   Bacteroides fragilis NOT DETECTED NOT DETECTED Final   Enterobacterales NOT DETECTED NOT DETECTED Final   Enterobacter cloacae complex NOT DETECTED NOT DETECTED Final   Escherichia coli NOT DETECTED NOT DETECTED Final   Klebsiella aerogenes NOT DETECTED NOT DETECTED Final   Klebsiella oxytoca NOT DETECTED NOT DETECTED Final   Klebsiella pneumoniae NOT DETECTED NOT DETECTED Final   Proteus species NOT DETECTED NOT DETECTED Final   Salmonella species NOT DETECTED NOT DETECTED Final   Serratia marcescens NOT DETECTED NOT DETECTED Final   Haemophilus influenzae NOT DETECTED NOT DETECTED Final   Neisseria meningitidis NOT DETECTED NOT DETECTED Final   Pseudomonas aeruginosa NOT DETECTED NOT DETECTED Final   Stenotrophomonas maltophilia NOT DETECTED NOT DETECTED Final   Candida albicans NOT DETECTED NOT DETECTED Final   Candida auris NOT DETECTED NOT DETECTED Final   Candida glabrata NOT DETECTED NOT DETECTED Final   Candida krusei NOT DETECTED NOT DETECTED Final   Candida parapsilosis NOT DETECTED NOT DETECTED Final   Candida tropicalis NOT DETECTED NOT DETECTED Final   Cryptococcus neoformans/gattii NOT DETECTED NOT DETECTED Final   Methicillin resistance mecA/C NOT DETECTED NOT DETECTED Final    Comment: Performed at [facility], [address], [place], [place] [address]  MRSA Next Gen by PCR, Nasal     Status: Abnormal   Collection Time:  01/09/24  1:12 AM   Specimen: Nasal Mucosa; Nasal Swab  Result Value Ref Range Status   MRSA by PCR Next Gen DETECTED (A) NOT DETECTED Final    Comment: RESULT CALLED TO, READ BACK BY AND VERIFIED WITH: L HYLTON AT 1516 ON [id number] BY S DALTON (NOTE) The GeneXpert MRSA Assay (FDA approved for NASAL specimens only), is one component of a comprehensive MRSA colonization surveillance program. It is not intended to diagnose MRSA infection nor to guide or monitor treatment for MRSA infections. Test performance is not FDA approved in patients less than 69 years old. Performed at [facility], [address] [place], [place] [address]     Radiology Studies: DG CHEST PORT 1 VIEW Result Date: 01/10/2024 CLINICAL DATA:  [id number] Dyspnea and respiratory abnormalities [id number] EXAM: PORTABLE CHEST 1 VIEW COMPARISON:  January 08, 2024 FINDINGS: Unchanged cardiomegaly. Chronic coarsening of the pulmonary interstitium, unchanged, with severe bullous change in the right upper lung zone, consistent with emphysema. No new airspace consolidation or pleural effusion. Aortic atherosclerosis. No pneumothorax. IMPRESSION: Emphysema.  Otherwise, no significant  interval change to the lungs. Electronically Signed   By: Rance Burrows M.D.   On: 01/10/2024 10:00   DG Chest 1 View Result Date: 01/08/2024 CLINICAL DATA:  Shortness of breath EXAM: CHEST  1 VIEW COMPARISON:  01/06/2024 FINDINGS: Stable cardiomediastinal silhouette. Chronic bronchitic change and hyperinflation. Bullous change in the right upper lobe. Interstitial coarsening has increased compared to 01/06/2024. No pleural effusion or pneumothorax. IMPRESSION: Increased interstitial coarsening compared to 01/06/2024 which may be due to edema or infection superimposed on a background of advanced emphysema. Electronically Signed   By: Rozell Cornet M.D.   On: 01/08/2024 21:13   Scheduled Meds:  sodium chloride    Intravenous Once   arformoterol   15 mcg  Nebulization BID   And   umeclidinium bromide   1 puff Inhalation Daily   aspirin  EC  81 mg Oral Daily   atorvastatin   20 mg Oral Daily   azithromycin   500 mg Oral Daily   Chlorhexidine  Gluconate Cloth  6 each Topical Daily   citalopram   20 mg Oral Daily   cloNIDine   0.3 mg Oral BID   clopidogrel   75 mg Oral Daily   dextromethorphan -guaiFENesin   1 tablet Oral BID   folic acid   1 mg Oral Daily   furosemide   20 mg Intravenous Q8H   heparin   5,000 Units Subcutaneous Q8H   losartan   12.5 mg Oral Daily   methylPREDNISolone  (SOLU-MEDROL ) injection  40 mg Intravenous Q12H   metoprolol  succinate  50 mg Oral Daily   mirtazapine   7.5 mg Oral QHS   montelukast   10 mg Oral QHS   mupirocin  ointment   Nasal BID   pantoprazole   40 mg Oral Daily   sodium chloride  flush  3 mL Intravenous Q12H   cyanocobalamin   500 mcg Oral Daily   Continuous Infusions:   LOS: 3 days   Colin Dawley M.D on 01/10/2024 at 12:32 PM  Go to [email or web address] - for contact info  [facility] - Office  [phone]  If 7PM-7AM, please contact night-coverage [email or web address] 01/10/2024, 12:32 PM

## 2024-01-10 NOTE — Progress Notes (Signed)
   01/10/24 2257  BiPAP/CPAP/SIPAP  BiPAP/CPAP/SIPAP Pt Type Adult  BiPAP/CPAP/SIPAP DREAMSTATIOND  Mask Type Full face mask  Dentures removed? Not applicable  Mask Size Medium  IPAP 10 cmH20  EPAP 6 cmH2O  Flow Rate 3 lpm  Peak Inspiratory Pressure (PIP) 10  Patient Home Machine No  Patient Home Mask No  Patient Home Tubing No  Auto Titrate No  BiPAP/CPAP /SiPAP Vitals  Pulse Rate 75  Resp 13  SpO2 100 %  Bilateral Breath Sounds Clear  MEWS Score/Color  MEWS Score 1  MEWS Score Color Green

## 2024-01-10 NOTE — Progress Notes (Signed)
 MD courage made aware pt still with no urine output. Last bladder scan 110. Pt also hypotensive throughout the day, medications reviewed by MD and midodrine  given. Pt reports feeling fine and down to 4L HFNC.

## 2024-01-11 ENCOUNTER — Inpatient Hospital Stay (HOSPITAL_COMMUNITY)

## 2024-01-11 DIAGNOSIS — R112 Nausea with vomiting, unspecified: Secondary | ICD-10-CM | POA: Diagnosis not present

## 2024-01-11 LAB — BASIC METABOLIC PANEL WITH GFR
Anion gap: 8 (ref 5–15)
BUN: 11 mg/dL (ref 8–23)
CO2: 25 mmol/L (ref 22–32)
Calcium: 7.5 mg/dL — ABNORMAL LOW (ref 8.9–10.3)
Chloride: 106 mmol/L (ref 98–111)
Creatinine, Ser: 1.33 mg/dL — ABNORMAL HIGH (ref 0.44–1.00)
GFR, Estimated: 42 mL/min — ABNORMAL LOW (ref >60)
Glucose, Bld: 184 mg/dL — ABNORMAL HIGH (ref 70–99)
Potassium: 4.5 mmol/L (ref 3.5–5.1)
Sodium: 139 mmol/L (ref 135–145)

## 2024-01-11 LAB — CULTURE, BLOOD (ROUTINE X 2)
Culture: NO GROWTH
Special Requests: ADEQUATE

## 2024-01-11 LAB — GLUCOSE, CAPILLARY
Glucose-Capillary: 166 mg/dL — ABNORMAL HIGH (ref 70–99)
Glucose-Capillary: 169 mg/dL — ABNORMAL HIGH (ref 70–99)

## 2024-01-11 MED ORDER — SODIUM CHLORIDE 0.9 % IV SOLN: INTRAVENOUS | Status: DC

## 2024-01-11 MED ORDER — FUROSEMIDE 10 MG/ML IJ SOLN: 40.0000 mg | Freq: Once | INTRAMUSCULAR | Status: DC

## 2024-01-11 MED ORDER — MIDODRINE HCL 5 MG PO TABS
5.0000 mg | ORAL_TABLET | Freq: Three times a day (TID) | ORAL | Status: DC
  Administered 2024-01-11 – 2024-01-12 (×5): 5 mg via ORAL
  Filled 2024-01-11 (×5): qty 1

## 2024-01-11 NOTE — Progress Notes (Signed)
 Physical Therapy Treatment Patient Details Name: [name] MRN: [id number] DOB: 10/18/1950 Today's Date: 01/11/2024   History of Present Illness [name] is a 45 yoM PMH of hypertension, chronic systolic heart failure, depression, CVA, anxiety, and COPD with chronic hypoxemia on 3 L nasal cannula oxygen - who presents to the emergency department due to  N/V x3 days was unable to keep anything down. Also c/o right sided flank pain. Similar complaint of hospitalization for intractable nausea and vomiting admitted 3/4 - 3/5    PT Comments  Patient presents seated in chair and agreeable for therapy.  Patient c/o severe dizziness when standing and limited to a few steps marching in place before having to sit due to near syncopal episode after looking up. Patient became alert and back to normal upon sitting and declined further activities due to not feeling well - nurse notified. Patient will benefit from continued skilled physical therapy in hospital and recommended venue below to increase strength, balance, endurance for safe ADLs and gait.     If plan is discharge home, recommend the following: A little help with walking and/or transfers;A little help with bathing/dressing/bathroom;Help with stairs or ramp for entrance;Assistance with cooking/housework   Can travel by private vehicle        Equipment Recommendations  None recommended by PT    Recommendations for Other Services       Precautions / Restrictions Precautions Precautions: Fall Recall of Precautions/Restrictions: Intact Restrictions Weight Bearing Restrictions Per Provider Order: No     Mobility  Bed Mobility               General bed mobility comments: Patient presents seated in chair (assisted by nursing staff)    Transfers Overall transfer level: Needs assistance Equipment used: Rolling walker (2 wheels) Transfers: Sit to/from Stand, Bed to chair/wheelchair/BSC Sit to Stand: Min assist   Step  pivot transfers: Min assist       General transfer comment: unsteady labored movement with c/o dizziness when standing    Ambulation/Gait Ambulation/Gait assistance: Mod assist Gait Distance (Feet): 6 Feet Assistive device: Rolling walker (2 wheels) Gait Pattern/deviations: Decreased step length - right, Decreased step length - left, Decreased stride length Gait velocity: slow     General Gait Details: limited to standing and marching in place before having to sit due to c/o severe dizziness, fatigue and right flank pain   Stairs             Wheelchair Mobility     Tilt Bed    Modified Rankin (Stroke Patients Only)       Balance Overall balance assessment: Needs assistance Sitting-balance support: Feet supported, No upper extremity supported Sitting balance-Leahy Scale: Fair Sitting balance - Comments: seated in chair   Standing balance support: During functional activity, Reliant on assistive device for balance, Bilateral upper extremity supported Standing balance-Leahy Scale: Poor Standing balance comment: using RW                            Communication Communication Communication: No apparent difficulties  Cognition Arousal: Alert Behavior During Therapy: WFL for tasks assessed/performed   PT - Cognitive impairments: No apparent impairments                         Following commands: Intact      Cueing Cueing Techniques: Verbal cues, Tactile cues  Exercises General Exercises - Lower Extremity Ankle Circles/Pumps:  Seated, AROM, Strengthening, Both, 10 reps Long Arc Quad: Seated, AROM, Strengthening, Both, 10 reps Hip Flexion/Marching: Seated, AROM, Strengthening, Both, 10 reps    General Comments        Pertinent Vitals/Pain Pain Assessment Pain Assessment: Faces Faces Pain Scale: Hurts little more Pain Location: right flank over rib cage due to fall at home per patient Pain Descriptors / Indicators: Sore,  Grimacing Pain Intervention(s): Limited activity within patient's tolerance, Monitored during session, Repositioned    Home Living                          Prior Function            PT Goals (current goals can now be found in the care plan section) Acute Rehab PT Goals Patient Stated Goal: return home with family to assist PT Goal Formulation: With patient/family Time For Goal Achievement: 01/16/24 Potential to Achieve Goals: Good Progress towards PT goals: Progressing toward goals    Frequency    Min 3X/week      PT Plan      Co-evaluation              AM-PAC PT "6 Clicks" Mobility   Outcome Measure  Help needed turning from your back to your side while in a flat bed without using bedrails?: None Help needed moving from lying on your back to sitting on the side of a flat bed without using bedrails?: None Help needed moving to and from a bed to a chair (including a wheelchair)?: A Little Help needed standing up from a chair using your arms (e.g., wheelchair or bedside chair)?: A Little Help needed to walk in hospital room?: A Lot Help needed climbing 3-5 steps with a railing? : A Lot 6 Click Score: 18    End of Session Equipment Utilized During Treatment: Oxygen  Activity Tolerance: Patient tolerated treatment well;Patient limited by fatigue Patient left: in chair;with call bell/phone within reach;with family/visitor present Nurse Communication: Mobility status PT Visit Diagnosis: Unsteadiness on feet (R26.81);Other abnormalities of gait and mobility (R26.89);Muscle weakness (generalized) (M62.81)     Time: 0102-7253 PT Time Calculation (min) (ACUTE ONLY): 17 min  Charges:    $Therapeutic Activity: 8-22 mins PT General Charges $$ ACUTE PT VISIT: 1 Visit                     2:15 PM, 01/11/24 Walton Guppy, MPT Physical Therapist with [facility] 336 [phone] office [phone] mobile phone

## 2024-01-11 NOTE — Progress Notes (Addendum)
 PROGRESS NOTE  [name], is a 73 y.o. female, DOB - May 12, 1951, NUU:[id number]  Admit date - 01/06/2024   Admitting Physician Twilla Galea, DO  Outpatient Primary MD for the patient is [facility]  LOS - 4  Chief Complaint  Patient presents with   Nausea      Brief Narrative:  [name] is a 74 yoM PMH of hypertension, chronic systolic heart failure, depression, CVA, anxiety, and COPD with chronic hypoxemia on 3 L nasal cannula oxygen  admitted on 01/07/24 with intractable emesis and right flank pain presumed related to recent rib fractures from recent fall   -Assessment and Plan: 1)Intractable nausea and vomiting -Resolved, no further emesis - Patient received IV fluids and antiemetics initially - -Patient reluctant to eat and drink--dehydrated again  2)HFimprEF--- patient with history of chronic systolic dysfunction CHF EF previously 35 to 40% , repeat echo on 04/07/2023 with EF improved to 50%  --patient was admitted with nausea vomiting and dehydration, she received IV fluids initially, went into pulmonary edema, then received IV Lasix  -Chest x-ray on 01/08/24 consistent with pulmonary edema---due to recent IV fluids for #1 above -Hypoxia improved, oxygen  requirement is back to her baseline of 3 L per/min via nasal cannula   -- After IV Lasix , repeat chest x-ray on 01/10/2024 without pulmonary edema, chest x-ray is now mostly consistent with COPD/emphysema  - No further diuretics - Repeat echo pending - Please see #14 below  3)history of CAD s/p RCA PCI--- no ACS type symptoms - Continue aspirin , Plavix  and atorvastatin  for secondary prevention -Continue metoprolol   4)Severe Emphysema/COPD ---- repeat chest x-ray on 01/10/2024 after IV Lasix /diuresis is for the pulmonary edema, CXr is  consistent with COPD/emphysema -- -Respiratory status and hypoxia improved with IV Solu-Medrol , no further steroids at this time Hypoxia improved, oxygen  requirement is  back to her baseline of 3 L per/min via nasal cannula - Continue mucolytics and azithromycin   5)Acute on chronic hypoxic respiratory failure - Worsened initially due to #2 and #4 above -Please see #4 above - Anticipate improvement with above measures  6)Hypotension--stopped clonidine ,   Lasix ,  metoprolol  and losartan  on 01/10/24 and started  Midodrine  due to concerns about orthostatic dizziness -On 01/11/24- With attempt to sit up or stand up patient becomes very dizzy with visual disturbance and elevated heart rate -She Near syncopal episode on 01/11/2024 with attempts to stand up - Patient is unable to stand up enough to get blood pressure standing -Apparently patient has been refusing to eat and drink for the last couple days and she appears dry -No chest pains, no leg pains no leg swelling no pleuritic symptoms - Will check a.m. cortisol 01/12/2024  (am cortisol levels may be inaccurate as patient got IV Solu-Medrol  for COPD during this admission) --Give 1 L of normal saline over 10 hours on 01/11/24 -- Await echo CT head pending  7)HTN--stable, continue clonidine , losartan  and metoprolol   8) Lactic acidosis with reactive mild leukocytosis - Lactic acid 5.6, 5.8, 3.8  afebrile, - Patient did receive cefepime  and Flagyl  - Suspect lactic acidosis is due to #1 above with dehydration - No evidence of sepsis, no further antibiotics indicated  9)GERD--stable, continue Protonix   10)Depression/Anxiety--- continue Celexa  and Remeron   11)Generalized weakness and deconditioning--therapy eval appreciated recommends home health PT  12)Hypokalemia -Resolved with replacement\  13) Staph epi bacteremia--blood cultures from 01/06/2024 with staph epi, suspect this is a  contaminant,   Status is: Inpatient   Disposition: The patient is from: Home  Anticipated d/c is to: Home              Anticipated d/c date is: 1 day              Patient currently is not medically stable to  d/c. Barriers: Not Clinically Stable-   Code Status :  -  Code Status: Full Code   Family Communication:    (patient is alert, awake and coherent)  Discussed with son at bedside   DVT Prophylaxis  :   - SCDs   heparin  injection 5,000 Units Start: 01/07/24 1400 TED hose Start: 01/07/24 0749 SCDs Start: 01/07/24 0749   Lab Results  Component Value Date   PLT 323 01/10/2024   Inpatient Medications  Scheduled Meds:  sodium chloride    Intravenous Once   arformoterol   15 mcg Nebulization BID   And   umeclidinium bromide   1 puff Inhalation Daily   aspirin  EC  81 mg Oral Daily   atorvastatin   20 mg Oral Daily   azithromycin   500 mg Oral Daily   Chlorhexidine  Gluconate Cloth  6 each Topical Daily   citalopram   20 mg Oral Daily   clopidogrel   75 mg Oral Daily   dextromethorphan -guaiFENesin   1 tablet Oral BID   folic acid   1 mg Oral Daily   furosemide   40 mg Intravenous Once   heparin   5,000 Units Subcutaneous Q8H   midodrine   5 mg Oral TID WC   mirtazapine   7.5 mg Oral QHS   montelukast   10 mg Oral QHS   mupirocin  ointment   Nasal BID   pantoprazole   40 mg Oral Daily   sodium chloride  flush  3 mL Intravenous Q12H   cyanocobalamin   500 mcg Oral Daily   Continuous Infusions:  sodium chloride       PRN Meds:.acetaminophen  **OR** acetaminophen , bisacodyl , hydrALAZINE , HYDROmorphone  (DILAUDID ) injection, ipratropium, levalbuterol , ondansetron  **OR** ondansetron  (ZOFRAN ) IV, oxyCODONE , senna-docusate, sodium chloride  flush, traZODone    Anti-infectives (From admission, onward)    Start     Dose/Rate Route Frequency Ordered Stop   01/10/24 1330  azithromycin  (ZITHROMAX ) tablet 500 mg        500 mg Oral Daily 01/10/24 1231     01/06/24 2200  ceFEPIme  (MAXIPIME ) 2 g in sodium chloride  0.9 % 100 mL IVPB        2 g 200 mL/hr over 30 Minutes Intravenous  Once 01/06/24 2149 01/06/24 2303   01/06/24 2200  metroNIDAZOLE  (FLAGYL ) IVPB 500 mg  Status:  Discontinued        500 mg 100  mL/hr over 60 Minutes Intravenous Every 12 hours 01/06/24 2149 01/10/24 1056       Subjective: [name] today has no fevers, no further emesis,  No chest pain,   - On 01/11/24- With attempt to sit up or stand up patient becomes very dizzy with visual disturbance and elevated heart rate -She Near syncopal episode on 01/11/2024 with attempts to stand up - Patient is unable to stand up enough to get blood pressure standing -Apparently patient has been refusing to eat and drink for the last couple days and she appears dry -No chest pains, no leg pains no leg swelling no pleuritic symptoms - Patient's son at bedside, questions answered   Objective: Vitals:   01/11/24 1300 01/11/24 1301 01/11/24 1302 01/11/24 1400  BP:    124/67  Pulse:  86    Resp: (!) 24 (!) 25 19 20   Temp:      TempSrc:  SpO2:  95%    Weight:      Height:        Intake/Output Summary (Last 24 hours) at 01/11/2024 1525 Last data filed at 01/11/2024 0800 Gross per 24 hour  Intake 480 ml  Output --  Net 480 ml   Filed Weights   01/09/24 0259 01/10/24 0625 01/11/24 0359  Weight: 61.6 kg 61.9 kg 63.5 kg   Physical Exam Gen:- Awake Alert, dizziness and dyspnea exertion but no conversational dyspnea HEENT:- [place].AT, No sclera icterus Neck-Supple Neck,No JVD,.  Nose- [place] 3L/min Lungs-improving air movement, No wheezing CV- S1, S2 normal, regular (tachycardic with attempts to sit up or stand up) Abd-  +ve B.Sounds, Abd Soft, No tenderness,    Extremity/Skin:- No  edema, pedal pulses present  Psych-affect is anxious at times,  oriented x3 Neuro-Generalized weakness, no new focal deficits, no tremors  Data Reviewed: I have personally reviewed following labs and imaging studies  CBC: Recent Labs  Lab 01/06/24 2123 01/08/24 0456 01/08/24 0944 01/08/24 2033 01/10/24 0453  WBC 12.1* 8.3  --   --  11.5*  HGB 8.8* 6.3* 7.1* 9.6* 8.9*  HCT 28.4* 20.6* 23.2* 30.3* 27.5*  MCV 103.3* 103.0*  --   --   100.0  PLT 406* 311  --   --  323   Basic Metabolic Panel: Recent Labs  Lab 01/06/24 2123 01/07/24 0415 01/07/24 0812 01/07/24 0915 01/08/24 0456 01/09/24 0522 01/10/24 0453 01/11/24 0430  NA 134*  --   --   --  141 138 138  139 139  K 2.4* 3.1*  --   --  3.3* 3.8 3.5  3.5 4.5  CL 87*  --   --   --  105 102 105  106 106  CO2 27  --   --   --  25 24 22  23 25   GLUCOSE 261*  --   --   --  135* 140* 169*  171* 184*  BUN 22  --   --   --  11 7* 7*  7* 11  CREATININE 1.43*  --   --   --  0.85 0.84 1.16*  1.10* 1.33*  CALCIUM  6.3*  --   --   --  7.2* 7.5* 7.4*  7.5* 7.5*  MG  --   --  0.7* 0.8*  --   --   --   --   PHOS  --   --  1.7* 1.7*  --   --  3.0  --    GFR: Estimated Creatinine Clearance: 32.5 mL/min (A) (by C-G formula based on SCr of 1.33 mg/dL (H)). Liver Function Tests: Recent Labs  Lab 01/06/24 2123 01/08/24 0456 01/09/24 0522 01/10/24 0453  AST 100* 38 46* 31  ALT 32 21 28 22   ALKPHOS 153* 101 129* 121  BILITOT 0.9 0.8 0.8 0.8  PROT 7.7 5.5* 6.0* 5.8*  ALBUMIN 2.5* 1.9* 2.2* 2.1*  2.1*   Recent Results (from the past 240 hours)  Resp panel by RT-PCR (RSV, Flu A&B, Covid) Anterior Nasal Swab     Status: None   Collection Time: 01/06/24 10:12 PM   Specimen: Anterior Nasal Swab  Result Value Ref Range Status   SARS Coronavirus 2 by RT PCR NEGATIVE NEGATIVE Final    Comment: (NOTE) SARS-CoV-2 target nucleic acids are NOT DETECTED.  The SARS-CoV-2 RNA is generally detectable in upper respiratory specimens during the acute phase of infection. The lowest concentration of SARS-CoV-2 viral copies  this assay can detect is 138 copies/mL. A negative result does not preclude SARS-Cov-2 infection and should not be used as the sole basis for treatment or other patient management decisions. A negative result may occur with  improper specimen collection/handling, submission of specimen other than nasopharyngeal swab, presence of viral mutation(s) within  the areas targeted by this assay, and inadequate number of viral copies(<138 copies/mL). A negative result must be combined with clinical observations, patient history, and epidemiological information. The expected result is Negative.  Fact Sheet for Patients:  BloggerCourse.com  Fact Sheet for Healthcare Providers:  SeriousBroker.it  This test is no t yet approved or cleared by the [place]  FDA and  has been authorized for detection and/or diagnosis of SARS-CoV-2 by FDA under an Emergency Use Authorization (EUA). This EUA will remain  in effect (meaning this test can be used) for the duration of the COVID-19 declaration under Section 564(b)(1) of the Act, 21 U.S.C.section 360bbb-3(b)(1), unless the authorization is terminated  or revoked sooner.       Influenza A by PCR NEGATIVE NEGATIVE Final   Influenza B by PCR NEGATIVE NEGATIVE Final    Comment: (NOTE) The Xpert Xpress SARS-CoV-2/FLU/RSV plus assay is intended as an aid in the diagnosis of influenza from Nasopharyngeal swab specimens and should not be used as a sole basis for treatment. Nasal washings and aspirates are unacceptable for Xpert Xpress SARS-CoV-2/FLU/RSV testing.  Fact Sheet for Patients: BloggerCourse.com  Fact Sheet for Healthcare Providers: SeriousBroker.it  This test is not yet approved or cleared by the [place]  FDA and has been authorized for detection and/or diagnosis of SARS-CoV-2 by FDA under an Emergency Use Authorization (EUA). This EUA will remain in effect (meaning this test can be used) for the duration of the COVID-19 declaration under Section 564(b)(1) of the Act, 21 U.S.C. section 360bbb-3(b)(1), unless the authorization is terminated or revoked.     Resp Syncytial Virus by PCR NEGATIVE NEGATIVE Final    Comment: (NOTE) Fact Sheet for  Patients: BloggerCourse.com  Fact Sheet for Healthcare Providers: SeriousBroker.it  This test is not yet approved or cleared by the [place]  FDA and has been authorized for detection and/or diagnosis of SARS-CoV-2 by FDA under an Emergency Use Authorization (EUA). This EUA will remain in effect (meaning this test can be used) for the duration of the COVID-19 declaration under Section 564(b)(1) of the Act, 21 U.S.C. section 360bbb-3(b)(1), unless the authorization is terminated or revoked.  Performed at [facility], [address], [place], [place] [address]   Blood culture (routine x 2)     Status: Abnormal   Collection Time: 01/06/24 10:20 PM   Specimen: BLOOD  Result Value Ref Range Status   Specimen Description   Final    BLOOD BLOOD LEFT ARM Performed at [facility], [address] [place], [place] [address]    Special Requests   Final    BOTTLES DRAWN AEROBIC AND ANAEROBIC Blood Culture adequate volume Performed at [facility], [address] [place], [place] [address]    Culture  Setup Time   Final    GRAM POSITIVE COCCI ANAEROBIC BOTTLE ONLY CRITICAL RESULT CALLED TO, READ BACK BY AND VERIFIED WITH: ANGIE R. ON 01/07/2024 @10 :41PM BY T.HAMER CRITICAL RESULT CALLED TO, READ BACK BY AND VERIFIED WITH: RN TERESA FRENCH [id number] BY J RAZZAK, MT    Culture (A)  Final    STAPHYLOCOCCUS EPIDERMIDIS THE SIGNIFICANCE OF ISOLATING THIS ORGANISM FROM A SINGLE SET OF BLOOD CULTURES WHEN  MULTIPLE SETS ARE DRAWN IS UNCERTAIN. PLEASE NOTIFY THE MICROBIOLOGY DEPARTMENT WITHIN ONE WEEK IF SPECIATION AND SENSITIVITIES ARE REQUIRED. Performed at [facility], [address] [place], [place] [address]    Report Status 01/10/2024 FINAL  Final  Blood culture (routine x 2)     Status: None   Collection Time: 01/06/24 10:20 PM   Specimen: BLOOD  Result Value Ref Range Status   Specimen Description BLOOD BLOOD RIGHT HAND   Final   Special Requests   Final    BOTTLES DRAWN AEROBIC AND ANAEROBIC Blood Culture adequate volume   Culture   Final    NO GROWTH 5 DAYS Performed at [facility], [address], [place], [place] [address]    Report Status 01/11/2024 FINAL  Final  Blood Culture ID Panel (Reflexed)     Status: Abnormal   Collection Time: 01/06/24 10:20 PM  Result Value Ref Range Status   Enterococcus faecalis NOT DETECTED NOT DETECTED Final   Enterococcus Faecium NOT DETECTED NOT DETECTED Final   Listeria monocytogenes NOT DETECTED NOT DETECTED Final   Staphylococcus species DETECTED (A) NOT DETECTED Final    Comment: CRITICAL RESULT CALLED TO, READ BACK BY AND VERIFIED WITH: RN TERESA FRENCH [id number] 1306 BY J RAZZAK MT    Staphylococcus aureus (BCID) NOT DETECTED NOT DETECTED Final   Staphylococcus epidermidis DETECTED (A) NOT DETECTED Final    Comment: CRITICAL RESULT CALLED TO, READ BACK BY AND VERIFIED WITH: RN TERESA FRENCH [id number] 1306 BY J RAZZAK MT    Staphylococcus lugdunensis NOT DETECTED NOT DETECTED Final   Streptococcus species NOT DETECTED NOT DETECTED Final   Streptococcus agalactiae NOT DETECTED NOT DETECTED Final   Streptococcus pneumoniae NOT DETECTED NOT DETECTED Final   Streptococcus pyogenes NOT DETECTED NOT DETECTED Final   A.calcoaceticus-baumannii NOT DETECTED NOT DETECTED Final   Bacteroides fragilis NOT DETECTED NOT DETECTED Final   Enterobacterales NOT DETECTED NOT DETECTED Final   Enterobacter cloacae complex NOT DETECTED NOT DETECTED Final   Escherichia coli NOT DETECTED NOT DETECTED Final   Klebsiella aerogenes NOT DETECTED NOT DETECTED Final   Klebsiella oxytoca NOT DETECTED NOT DETECTED Final   Klebsiella pneumoniae NOT DETECTED NOT DETECTED Final   Proteus species NOT DETECTED NOT DETECTED Final   Salmonella species NOT DETECTED NOT DETECTED Final   Serratia marcescens NOT DETECTED NOT DETECTED Final   Haemophilus influenzae NOT DETECTED NOT DETECTED Final    Neisseria meningitidis NOT DETECTED NOT DETECTED Final   Pseudomonas aeruginosa NOT DETECTED NOT DETECTED Final   Stenotrophomonas maltophilia NOT DETECTED NOT DETECTED Final   Candida albicans NOT DETECTED NOT DETECTED Final   Candida auris NOT DETECTED NOT DETECTED Final   Candida glabrata NOT DETECTED NOT DETECTED Final   Candida krusei NOT DETECTED NOT DETECTED Final   Candida parapsilosis NOT DETECTED NOT DETECTED Final   Candida tropicalis NOT DETECTED NOT DETECTED Final   Cryptococcus neoformans/gattii NOT DETECTED NOT DETECTED Final   Methicillin resistance mecA/C NOT DETECTED NOT DETECTED Final    Comment: Performed at [facility], [address] [place], [place] [address]  MRSA Next Gen by PCR, Nasal     Status: Abnormal   Collection Time: 01/09/24  1:12 AM   Specimen: Nasal Mucosa; Nasal Swab  Result Value Ref Range Status   MRSA by PCR Next Gen DETECTED (A) NOT DETECTED Final    Comment: RESULT CALLED TO, READ BACK BY AND VERIFIED WITH: L HYLTON AT 1516 ON [id number] BY S DALTON (  NOTE) The GeneXpert MRSA Assay (FDA approved for NASAL specimens only), is one component of a comprehensive MRSA colonization surveillance program. It is not intended to diagnose MRSA infection nor to guide or monitor treatment for MRSA infections. Test performance is not FDA approved in patients less than 64 years old. Performed at [facility], [address] [place], [place] [address]     Radiology Studies: DG CHEST PORT 1 VIEW Result Date: 01/10/2024 CLINICAL DATA:  [id number] Dyspnea and respiratory abnormalities [id number] EXAM: PORTABLE CHEST 1 VIEW COMPARISON:  January 08, 2024 FINDINGS: Unchanged cardiomegaly. Chronic coarsening of the pulmonary interstitium, unchanged, with severe bullous change in the right upper lung zone, consistent with emphysema. No new airspace consolidation or pleural effusion. Aortic atherosclerosis. No pneumothorax. IMPRESSION: Emphysema.  Otherwise, no significant  interval change to the lungs. Electronically Signed   By: Rance Burrows M.D.   On: 01/10/2024 10:00   Scheduled Meds:  sodium chloride    Intravenous Once   arformoterol   15 mcg Nebulization BID   And   umeclidinium bromide   1 puff Inhalation Daily   aspirin  EC  81 mg Oral Daily   atorvastatin   20 mg Oral Daily   azithromycin   500 mg Oral Daily   Chlorhexidine  Gluconate Cloth  6 each Topical Daily   citalopram   20 mg Oral Daily   clopidogrel   75 mg Oral Daily   dextromethorphan -guaiFENesin   1 tablet Oral BID   folic acid   1 mg Oral Daily   furosemide   40 mg Intravenous Once   heparin   5,000 Units Subcutaneous Q8H   midodrine   5 mg Oral TID WC   mirtazapine   7.5 mg Oral QHS   montelukast   10 mg Oral QHS   mupirocin  ointment   Nasal BID   pantoprazole   40 mg Oral Daily   sodium chloride  flush  3 mL Intravenous Q12H   cyanocobalamin   500 mcg Oral Daily   Continuous Infusions:  sodium chloride       LOS: 4 days   Colin Dawley M.D on 01/11/2024 at 3:25 PM  Go to [email or web address] - for contact info  [facility] - Office  [phone]  If 7PM-7AM, please contact night-coverage [email or web address] 01/11/2024, 3:25 PM

## 2024-01-11 NOTE — Plan of Care (Signed)
  Problem: Education: Goal: Knowledge of General Education information will improve Description: Including pain rating scale, medication(s)/side effects and non-pharmacologic comfort measures 01/11/2024 1600 by Bronwen Canon, RN Outcome: Progressing 01/11/2024 1559 by Bronwen Canon, RN Outcome: Progressing   Problem: Health Behavior/Discharge Planning: Goal: Ability to manage health-related needs will improve 01/11/2024 1600 by Bronwen Canon, RN Outcome: Progressing 01/11/2024 1559 by Bronwen Canon, RN Outcome: Progressing   Problem: Clinical Measurements: Goal: Ability to maintain clinical measurements within normal limits will improve 01/11/2024 1600 by Bronwen Canon, RN Outcome: Progressing 01/11/2024 1559 by Bronwen Canon, RN Outcome: Progressing Goal: Will remain free from infection 01/11/2024 1600 by Bronwen Canon, RN Outcome: Progressing 01/11/2024 1559 by Bronwen Canon, RN Outcome: Progressing Goal: Diagnostic test results will improve 01/11/2024 1600 by Bronwen Canon, RN Outcome: Progressing 01/11/2024 1559 by Bronwen Canon, RN Outcome: Progressing Goal: Respiratory complications will improve 01/11/2024 1600 by Bronwen Canon, RN Outcome: Progressing 01/11/2024 1559 by Bronwen Canon, RN Outcome: Progressing Goal: Cardiovascular complication will be avoided 01/11/2024 1600 by Bronwen Canon, RN Outcome: Progressing 01/11/2024 1559 by Bronwen Canon, RN Outcome: Progressing   Problem: Activity: Goal: Risk for activity intolerance will decrease 01/11/2024 1600 by Bronwen Canon, RN Outcome: Progressing 01/11/2024 1559 by Bronwen Canon, RN Outcome: Progressing   Problem: Nutrition: Goal: Adequate nutrition will be maintained 01/11/2024 1600 by Bronwen Canon, RN Outcome: Progressing 01/11/2024 1559 by Bronwen Canon, RN Outcome: Progressing   Problem: Coping: Goal: Level of anxiety will decrease 01/11/2024 1600 by Bronwen Canon, RN Outcome: Progressing 01/11/2024 1559 by Bronwen Canon, RN Outcome: Progressing   Problem: Elimination: Goal: Will not experience complications related to bowel motility 01/11/2024 1600 by Bronwen Canon, RN Outcome: Progressing 01/11/2024 1559 by Bronwen Canon, RN Outcome: Progressing Goal: Will not experience complications related to urinary retention 01/11/2024 1600 by Bronwen Canon, RN Outcome: Progressing 01/11/2024 1559 by Bronwen Canon, RN Outcome: Progressing   Problem: Pain Managment: Goal: General experience of comfort will improve and/or be controlled 01/11/2024 1600 by Bronwen Canon, RN Outcome: Progressing 01/11/2024 1559 by Bronwen Canon, RN Outcome: Progressing   Problem: Safety: Goal: Ability to remain free from injury will improve 01/11/2024 1600 by Bronwen Canon, RN Outcome: Progressing 01/11/2024 1559 by Bronwen Canon, RN Outcome: Progressing   Problem: Skin Integrity: Goal: Risk for impaired skin integrity will decrease 01/11/2024 1600 by Bronwen Canon, RN Outcome: Progressing 01/11/2024 1559 by Bronwen Canon, RN Outcome: Progressing

## 2024-01-11 NOTE — Progress Notes (Addendum)
 Attempted to complete orthostatic vitals. Patient proceeded to become tachycardic when moving from laying to sitting position; patient complained of dizziness and blurry vision. When attempting to raise the patient to a standing position, the patient reportedly became unsteady and eyes rolled upwards and patient appeared as if she was going to collapse and had to immediately sit back down in bed. She did not loose consciousness but stated she felt dizzy. Unable to obtain orthostatic standing. Patient O2 sats remained stable at 95-100% on 3LNC. Questions answered. Dr. Josiah Nigh aware.   BP- lying: 154/66 Pulse-lying: 109 BP-sitting: 130/78 Pulse- sitting: 120  Standing: patient unable to complete. See note above.

## 2024-01-11 NOTE — TOC Transition Note (Signed)
 Transition of Care [facility]) - Discharge Note   Patient Details  Name: [name] MRN: [id number] Date of Birth: 11-11-1950  Transition of Care [facility]) CM/SW Contact:  Juanda Noon Phone Number: 01/11/2024, 10:21 AM   Clinical Narrative:     Patient is scheduled to DC home today. Randel Buss with [facility] updated on patient DC today and orders placed for PT/OT/RN/SW. CSW left son a confidential voicemail about DC. TOC signing off.   Final next level of care: Home w Home Health Services Barriers to Discharge: Barriers Resolved   Patient Goals and CMS Choice Patient states their goals for this hospitalization and ongoing recovery are:: return home CMS Medicare.gov Compare Post Acute Care list provided to:: Patient        Discharge Placement                Patient to be transferred to facility by: POV Name of family member notified: Elouise-patient Patient and family notified of of transfer: 01/11/24  Discharge Plan and Services Additional resources added to the After Visit Summary for       Post Acute Care Choice: Home Health          DME Arranged: N/A         HH Arranged: PT, OT, Social Work, Charity fundraiser HH Agency: [facility] Date [facility] Agency Contacted: 01/11/24 Time HH Agency Contacted: 1019 Representative spoke with at [facility] Agency: Randel Buss  Social Drivers of Health (SDOH) Interventions SDOH Screenings   Food Insecurity: No Food Insecurity (01/07/2024)  Housing: Low Risk  (01/07/2024)  Transportation Needs: No Transportation Needs (01/07/2024)  Utilities: Not At Risk (01/07/2024)  Social Connections: Moderately Isolated (01/07/2024)  Tobacco Use: Medium Risk (01/07/2024)     Readmission Risk Interventions    01/11/2024   10:17 AM 01/07/2024   11:02 AM  Readmission Risk Prevention Plan  Transportation Screening Complete Complete  PCP or Specialist Appt within 3-5 Days  Not Complete  HRI or Home Care Consult  Complete  Social Work Consult for Recovery Care  Planning/Counseling  Complete  Palliative Care Screening  Complete  Medication Review Oceanographer) Complete Complete  HRI or Home Care Consult Complete   SW Recovery Care/Counseling Consult Complete   Palliative Care Screening Not Applicable   Skilled Nursing Facility Not Applicable

## 2024-01-11 NOTE — Plan of Care (Signed)

## 2024-01-11 NOTE — Progress Notes (Signed)
   01/11/24 2249  BiPAP/CPAP/SIPAP  BiPAP/CPAP/SIPAP Pt Type Adult  BiPAP/CPAP/SIPAP DREAMSTATIOND  Mask Type Full face mask  Dentures removed? Not applicable  Mask Size Medium  IPAP 10 cmH20  EPAP 6 cmH2O  Flow Rate 3 lpm  Peak Inspiratory Pressure (PIP) 10  Patient Home Machine No  Patient Home Mask No  Patient Home Tubing No  Auto Titrate No  BiPAP/CPAP /SiPAP Vitals  Pulse Rate 87  Resp 18  SpO2 99 %  Bilateral Breath Sounds Clear;Diminished  MEWS Score/Color  MEWS Score 0  MEWS Score Color Green

## 2024-01-12 ENCOUNTER — Inpatient Hospital Stay (HOSPITAL_COMMUNITY)

## 2024-01-12 ENCOUNTER — Telehealth: Payer: Self-pay | Admitting: *Deleted

## 2024-01-12 DIAGNOSIS — R0609 Other forms of dyspnea: Secondary | ICD-10-CM | POA: Diagnosis not present

## 2024-01-12 DIAGNOSIS — R112 Nausea with vomiting, unspecified: Secondary | ICD-10-CM | POA: Diagnosis not present

## 2024-01-12 LAB — ECHOCARDIOGRAM COMPLETE
AR max vel: 1.25 cm2
AV Area VTI: 1.04 cm2
AV Area mean vel: 1.12 cm2
AV Mean grad: 3 mmHg
AV Peak grad: 6.7 mmHg
Ao pk vel: 1.29 m/s
Area-P 1/2: 4.44 cm2
Height: 62 in
S' Lateral: 2.9 cm
Weight: 2239.87 [oz_av]

## 2024-01-12 LAB — RENAL FUNCTION PANEL
Albumin: 2.1 g/dL — ABNORMAL LOW (ref 3.5–5.0)
Anion gap: 8 (ref 5–15)
BUN: 12 mg/dL (ref 8–23)
CO2: 24 mmol/L (ref 22–32)
Calcium: 7.6 mg/dL — ABNORMAL LOW (ref 8.9–10.3)
Chloride: 109 mmol/L (ref 98–111)
Creatinine, Ser: 0.96 mg/dL (ref 0.44–1.00)
GFR, Estimated: >60 mL/min (ref >60)
Glucose, Bld: 124 mg/dL — ABNORMAL HIGH (ref 70–99)
Phosphorus: 1.9 mg/dL — ABNORMAL LOW (ref 2.5–4.6)
Potassium: 3.9 mmol/L (ref 3.5–5.1)
Sodium: 141 mmol/L (ref 135–145)

## 2024-01-12 LAB — TYPE AND SCREEN
ABO/RH(D): B POS
Antibody Screen: NEGATIVE
Unit division: 0
Unit division: 0

## 2024-01-12 LAB — BPAM RBC
Blood Product Expiration Date: 202505032359
Blood Product Expiration Date: 202505112359
ISSUE DATE / TIME: 202504191448
Unit Type and Rh: 1700
Unit Type and Rh: 5100

## 2024-01-12 LAB — CBC
HCT: 29 % — ABNORMAL LOW (ref 36.0–46.0)
Hemoglobin: 8.8 g/dL — ABNORMAL LOW (ref 12.0–15.0)
MCH: 31.4 pg (ref 26.0–34.0)
MCHC: 30.3 g/dL (ref 30.0–36.0)
MCV: 103.6 fL — ABNORMAL HIGH (ref 80.0–100.0)
Platelets: 385 10*3/uL (ref 150–400)
RBC: 2.8 MIL/uL — ABNORMAL LOW (ref 3.87–5.11)
RDW: 14.9 % (ref 11.5–15.5)
WBC: 13.3 10*3/uL — ABNORMAL HIGH (ref 4.0–10.5)
nRBC: 0.5 % — ABNORMAL HIGH (ref 0.0–0.2)

## 2024-01-12 LAB — GLUCOSE, CAPILLARY
Glucose-Capillary: 101 mg/dL — ABNORMAL HIGH (ref 70–99)
Glucose-Capillary: 120 mg/dL — ABNORMAL HIGH (ref 70–99)
Glucose-Capillary: 130 mg/dL — ABNORMAL HIGH (ref 70–99)
Glucose-Capillary: 133 mg/dL — ABNORMAL HIGH (ref 70–99)

## 2024-01-12 LAB — CORTISOL-AM, BLOOD: Cortisol - AM: 5.3 ug/dL — ABNORMAL LOW (ref 6.7–22.6)

## 2024-01-12 MED ORDER — ORAL CARE MOUTH RINSE: 15.0000 mL | OROMUCOSAL | Status: DC | PRN

## 2024-01-12 MED ORDER — POTASSIUM PHOSPHATES 15 MMOLE/5ML IV SOLN
30.0000 mmol | Freq: Once | INTRAVENOUS | Status: AC
  Administered 2024-01-12: 30 mmol via INTRAVENOUS
  Filled 2024-01-12: qty 10

## 2024-01-12 MED ORDER — COSYNTROPIN 0.25 MG IJ SOLR
0.2500 mg | Freq: Once | INTRAMUSCULAR | Status: AC
  Administered 2024-01-13: 0.25 mg via INTRAVENOUS
  Filled 2024-01-12 (×2): qty 0.25

## 2024-01-12 NOTE — Progress Notes (Signed)
 [facility] PROGRESS NOTE  ALAYNNA KERWOOD (DOB: 28-Mar-1951) MVH:[id number] PCP: [facility], [facility]  Brief Narrative: [name] is a 26 yoM PMH of hypertension, chronic systolic heart failure, depression, CVA, anxiety, and COPD with chronic hypoxemia on 3 L nasal cannula oxygen  admitted on 01/07/24 with intractable emesis and right flank pain presumed related to recent rib fractures from recent fall   Subjective: Feeling better today, urine output picking up per pt and Son at bedside. She ate a banana but not much else despite encouragement as she fears recurrent nausea and vomiting, but hasn't vomited in past 24 hours.   Objective: BP 124/70   Pulse 68   Temp 98.4 F (36.9 C) (Oral)   Resp 19   Ht 5\' 2"  (1.575 m)   Wt 63.5 kg   SpO2 100%   BMI 25.60 kg/m   Gen: Frail female in no acute distress Pulm: Clear, though tachypneic and at times purse lipped breathing though she states this is her baseline.   CV: RRR, no MRG or pitting edema GI: Soft, NT, ND, +BS Neuro: Alert and oriented, weak diffusely but without new focal deficits. Ext: Warm, no deformities. Skin: No open wounds on visualized skin   Assessment & Plan: Intractable nausea and vomiting: Improved. Note admission for same early March 2025.  - If recurrent, consider GI evaluation.  - Will allow unrestricted diet. Support with antiemetics. Son hopeful that she will take better po at home.     HFimprEF--- patient with history of chronic systolic dysfunction CHF EF previously 35 to 40% , repeat echo on 04/07/2023 with EF improved to 50%  --patient was admitted with nausea vomiting and dehydration, she received IV fluids initially, went into pulmonary edema, then received IV lasix  with hypoxia improved and repeat chest x-ray on 01/10/2024 without pulmonary edema - Holding IVF and diuretics today, echo repeat pending today.     CAD s/p RCA PCI--- no ACS type symptoms - Continue aspirin , plavix ,  atorvastatin . Holding metoprolol  with hypotension.    Severe emphysema/COPD: Initially treated for AECOPD with solumedrol. Hypoxia improved, oxygen  requirement is back to her baseline of 3 L per/min via nasal cannula - Continue mucolytics and azithromycin    Acute on chronic hypoxic respiratory failure: Due to IVF initially and has since returned to baseline. No exam-based evidence of recurrent pulmonary edema nor AECOPD.   - Continue home O2.    Hypotension, orthostatic hypotension, on essential HTN: - CT head nonacute 4/22 after near syncopal episode.  - Given hx chronic HFrEF and current orthostatic intolerance, will check echo (pending today).  - Holding home clonidine , losartan  and metoprolol . Notes report GDMT was limited by hypotension.  - Cortisol was checked in setting of solumedrol and is 5.3, will perform ACTH  stim test 4/24.    Lactic acidosis with reactive mild leukocytosis: Suspect hemoconcentration/reactive process. LA improved. No sepsis suspected. Given IV steroids previously, doubt there is utility in trending leukocytosis at this time.   GERD:  - Continue PPI   Depression/Anxiety:  - Continue SSRI, remeron    Generalized weakness and deconditioning:  - Home health therapies ordered and arranged to assist at home, will go home with Son.    Hypophosphatemia: Likely related in inadequate po intake.  - Supplement today and monitor.   Hypokalemia: Resolved with replacement   Staph epidermidis blood culture contaminant.    Wynetta Heckle, MD [facility] [email or web address] 01/12/2024, 12:48 PM

## 2024-01-12 NOTE — NC FL2 (Signed)
 [place]  MEDICAID FL2 LEVEL OF CARE FORM     IDENTIFICATION  Patient Name: [name] Birthdate: 1950/12/23 Sex: female Admission Date (Current Location): 01/06/2024  [facility] and [place] Number:  [employer] and Address:  [facility],  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 10/01/2022   Chronic hypoxic respiratory failure (HCC) 10/01/2022   Protein-calorie malnutrition, severe 06/13/2022   Impaired glucose tolerance 06/02/2022   Hypernatremia 06/02/2022   Hypophosphatemia 06/01/2022   Hyperkalemia 05/31/2022   Malnutrition of moderate degree 05/30/2022   Lobar pneumonia (HCC) 05/30/2022   Elevated troponin 05/29/2022   Chronic kidney disease, stage 3a (HCC) 05/29/2022   COPD with acute exacerbation (HCC) 05/29/2022   Anemia    Gastroesophageal reflux disease    Nausea vomiting and diarrhea    Dysphagia    Pressure injury of skin 05/03/2022   COVID-19 virus infection 05/02/2022   AKI (acute kidney injury) (HCC) 05/02/2022   Acute respiratory failure with hypoxia and hypercarbia (HCC) 05/02/2022   Hypokalemia     Calculus of gallbladder with acute cholecystitis and obstruction    Choledocholithiasis 10/09/2021   Noninfectious gastroenteritis 07/16/2014   Functional constipation 10/27/2012   Colon cancer screening 10/27/2012   History of stroke 10/26/2012   Chronic back pain 10/26/2012   Dehydration 09/07/2011   HYPERLIPIDEMIA-MIXED 03/18/2009   Essential hypertension 03/18/2009   Disorder of kidney and ureter 03/18/2009    Orientation RESPIRATION BLADDER Height & Weight     Self, Time, Situation, Place  O2 (3L) Incontinent, External catheter Weight: 139 lb 15.9 oz (63.5 kg) Height:  5\' 2"  (157.5 cm)  BEHAVIORAL SYMPTOMS/MOOD NEUROLOGICAL BOWEL NUTRITION STATUS      Continent Diet  AMBULATORY STATUS COMMUNICATION OF NEEDS Skin   Extensive Assist Verbally Normal                       Personal Care Assistance Level of Assistance  Bathing, Feeding, Dressing Bathing Assistance: Limited assistance Feeding assistance: Independent Dressing Assistance: Limited assistance     Functional Limitations Info  Sight, Hearing, Speech Sight Info: Impaired Hearing Info: Adequate Speech Info: Adequate    SPECIAL CARE FACTORS FREQUENCY  PT (By licensed PT), OT (By licensed OT)     PT Frequency: 5 times weekly OT Frequency: 5 times weekly            Contractures Contractures Info: Not present    Additional Factors Info  Code Status, Allergies Code Status Info: FULL Allergies Info: Aspirin , Bee Venom, Contrast Media (Iodinated Contrast Media), Lisinopril, Motrin (Ibuprofen), Penicillins, Dilaudid  (Hydromorphone  Hcl)  Current Medications (01/12/2024):  This is the current hospital active medication list Current Facility-Administered Medications  Medication Dose Route Frequency Provider Last Rate Last Admin   0.9 %  sodium chloride  infusion (Manually program via Guardrails IV Fluids)   Intravenous Once Shahmehdi, Seyed A, MD   Held at 01/08/24 1325   acetaminophen   (TYLENOL ) tablet 650 mg  650 mg Oral Q6H PRN Shahmehdi, Constantino Demark, MD       Or   acetaminophen  (TYLENOL ) suppository 650 mg  650 mg Rectal Q6H PRN Shahmehdi, Seyed A, MD       arformoterol  (BROVANA ) nebulizer solution 15 mcg  15 mcg Nebulization BID Shahmehdi, Seyed A, MD   15 mcg at 01/12/24 1610   And   umeclidinium bromide  (INCRUSE ELLIPTA ) 62.5 MCG/ACT 1 puff  1 puff Inhalation Daily Shahmehdi, Domenick Friedlander A, MD   1 puff at 01/12/24 0835   aspirin  EC tablet 81 mg  81 mg Oral Daily Shahmehdi, Seyed A, MD   81 mg at 01/12/24 0935   atorvastatin  (LIPITOR) tablet 20 mg  20 mg Oral Daily Emokpae, Courage, MD   20 mg at 01/11/24 2100   azithromycin  (ZITHROMAX ) tablet 500 mg  500 mg Oral Daily Emokpae, Courage, MD   500 mg at 01/12/24 0934   bisacodyl  (DULCOLAX) EC tablet 5 mg  5 mg Oral Daily PRN Amber Bail A, MD       Chlorhexidine  Gluconate Cloth 2 % PADS 6 each  6 each Topical Daily Adefeso, Oladapo, DO   6 each at 01/12/24 0935   citalopram  (CELEXA ) tablet 20 mg  20 mg Oral Daily Shahmehdi, Seyed A, MD   20 mg at 01/12/24 0947   clopidogrel  (PLAVIX ) tablet 75 mg  75 mg Oral Daily Shahmehdi, Seyed A, MD   75 mg at 01/12/24 0934   [START ON 01/13/2024] cosyntropin  (CORTROSYN ) injection 0.25 mg  0.25 mg Intravenous Once Grunz, Ryan B, MD       dextromethorphan -guaiFENesin  (MUCINEX  DM) 30-600 MG per 12 hr tablet 1 tablet  1 tablet Oral BID Emokpae, Courage, MD   1 tablet at 01/12/24 0935   folic acid  (FOLVITE ) tablet 1 mg  1 mg Oral Daily Shahmehdi, Seyed A, MD   1 mg at 01/12/24 0934   heparin  injection 5,000 Units  5,000 Units Subcutaneous Q8H Shahmehdi, Seyed A, MD   5,000 Units at 01/12/24 1305   hydrALAZINE  (APRESOLINE ) injection 10 mg  10 mg Intravenous Q4H PRN Shahmehdi, Seyed A, MD       HYDROmorphone  (DILAUDID ) injection 0.5-1 mg  0.5-1 mg Intravenous Q2H PRN Shahmehdi, Seyed A, MD   1 mg at 01/10/24 2116   ipratropium (ATROVENT ) nebulizer solution 0.5 mg  0.5 mg Nebulization Q6H PRN  Shahmehdi, Seyed A, MD   0.5 mg at 01/09/24 1606   levalbuterol  (XOPENEX ) nebulizer solution 0.63 mg  0.63 mg Nebulization Q6H PRN Shahmehdi, Seyed A, MD   0.63 mg at 01/10/24 0320   midodrine  (PROAMATINE ) tablet 5 mg  5 mg Oral TID WC Emokpae, Courage, MD   5 mg at 01/12/24 1212   mirtazapine  (REMERON ) tablet 7.5 mg  7.5 mg Oral QHS Shahmehdi, Seyed A, MD   7.5 mg at 01/11/24 2100   montelukast  (SINGULAIR ) tablet 10 mg  10 mg Oral QHS Shahmehdi, Seyed A, MD   10 mg at 01/11/24 2109   mupirocin  ointment (BACTROBAN ) 2 %   Nasal BID Colin Dawley, MD   Given at 01/12/24 0936   ondansetron  (ZOFRAN ) tablet 4  mg  4 mg Oral Q6H PRN Shahmehdi, Seyed A, MD       Or   ondansetron  (ZOFRAN ) injection 4 mg  4 mg Intravenous Q6H PRN Shahmehdi, Seyed A, MD       Oral care mouth rinse  15 mL Mouth Rinse PRN Wynetta Heckle, MD       oxyCODONE  (Oxy IR/ROXICODONE ) immediate release tablet 5 mg  5 mg Oral Q4H PRN Shahmehdi, Seyed A, MD   5 mg at 01/08/24 1353   pantoprazole  (PROTONIX ) EC tablet 40 mg  40 mg Oral Daily Shahmehdi, Seyed A, MD   40 mg at 01/12/24 0935   potassium PHOSPHATE  30 mmol in dextrose  5 % 500 mL infusion  30 mmol Intravenous Once Grunz, Ryan B, MD 85 mL/hr at 01/12/24 0948 30 mmol at 01/12/24 0948   senna-docusate (Senokot-S) tablet 1 tablet  1 tablet Oral QHS PRN Shahmehdi, Seyed A, MD       sodium chloride  flush (NS) 0.9 % injection 3 mL  3 mL Intravenous Q12H Shahmehdi, Seyed A, MD   3 mL at 01/12/24 0936   sodium chloride  flush (NS) 0.9 % injection 3-10 mL  3-10 mL Intravenous PRN Shahmehdi, Seyed A, MD       traZODone  (DESYREL ) tablet 25 mg  25 mg Oral QHS PRN Shahmehdi, Seyed A, MD       vitamin B-12 (CYANOCOBALAMIN ) tablet 500 mcg  500 mcg Oral Daily Shahmehdi, Seyed A, MD   500 mcg at 01/12/24 0981     Discharge Medications: Please see discharge summary for a list of discharge medications.  Relevant Imaging Results:  Relevant Lab Results:   Additional Information SSN: [id number]  [address], [place]CS[place]

## 2024-01-12 NOTE — Progress Notes (Signed)
 Patient declined BIPAP tonight. Unit at bedside if she changes her mind.

## 2024-01-12 NOTE — Plan of Care (Signed)

## 2024-01-12 NOTE — Progress Notes (Signed)
  Echocardiogram 2D Echocardiogram has been performed.  Annis Kinder, RDCS 01/12/2024, 12:24 PM

## 2024-01-12 NOTE — Telephone Encounter (Signed)
 Copied from CRM [id number]. Topic: Clinical - Prescription Issue >> Jan 11, 2024  8:25 AM Hilton Lucky wrote: Reason for CRM: Bishop Bullock with [employer] is calling in to request an update on the order request faxed over on 04/17 for oxygen  supplies. Please reach back out at [phone] or fax to [phone].   Routing to front to see if they received this CMN that [facility] is calling about.

## 2024-01-12 NOTE — TOC Progression Note (Signed)
 Transition of Care [facility]) - Progression Note    Patient Details  Name: [name] MRN: [id number] Date of Birth: 1951/03/01  [facility]) CM/SW Contact  Grandville Lax, [place] Phone Number: 01/12/2024, 3:29 PM  Clinical Narrative:    CSW updated that PT is now recommending SNF for pt at bedside. CSW spoke with pt at bedside about this, she is agreeable to SNF referral being sent out locally. Pt would like to speak with her son about SNF vs HH with [facility] that has been set up. TOC to follow.   Expected Discharge Plan: Home w Home Health Services Barriers to Discharge: Continued Medical Work up  Expected Discharge Plan and Services     Post Acute Care Choice: Home Health Living arrangements for the past 2 months: Single Family Home                 DME Arranged: N/A         HH Arranged: PT, OT, Social Work, Charity fundraiser HH Agency: [facility] Date HH Agency Contacted: 01/11/24 Time HH Agency Contacted: 1019 Representative spoke with at [facility] Agency: Randel Buss   Social Determinants of Health (SDOH) Interventions SDOH Screenings   Food Insecurity: No Food Insecurity (01/07/2024)  Housing: Low Risk  (01/11/2024)  Transportation Needs: No Transportation Needs (01/07/2024)  Utilities: Not At Risk (01/07/2024)  Social Connections: Moderately Isolated (01/07/2024)  Tobacco Use: Medium Risk (01/07/2024)    Readmission Risk Interventions    01/11/2024   10:17 AM 01/07/2024   11:02 AM  Readmission Risk Prevention Plan  Transportation Screening Complete Complete  PCP or Specialist Appt within 3-5 Days  Not Complete  HRI or Home Care Consult  Complete  Social Work Consult for Recovery Care Planning/Counseling  Complete  Palliative Care Screening  Complete  Medication Review Oceanographer) Complete Complete  HRI or Home Care Consult Complete   SW Recovery Care/Counseling Consult Complete   Palliative Care Screening Not Applicable   Skilled Nursing Facility Not  Applicable

## 2024-01-12 NOTE — Progress Notes (Signed)
 Physical Therapy Treatment Patient Details Name: [name] MRN: [id number] DOB: Dec 27, 1950 Today's Date: 01/12/2024   History of Present Illness [name] is a 31 yoM PMH of hypertension, chronic systolic heart failure, depression, CVA, anxiety, and COPD with chronic hypoxemia on 3 L nasal cannula oxygen - who presents to the emergency department due to  N/V x3 days was unable to keep anything down. Also c/o right sided flank pain. Similar complaint of hospitalization for intractable nausea and vomiting admitted 3/4 - 3/5    PT Comments  Patient demonstrates slow labored movement for sitting up at bedside with [facility] raised, able to ambulate in room with unsteady gait having most difficulty making turns and requiring frequent verbal/tactile cueing to step closer to RW with fair carryover. Patient limited mostly due to c/o fatigue, SOB and generalized weakness. Patient tolerated sitting up in chair after therapy. Patient will benefit from continued skilled physical therapy in hospital and recommended venue below to increase strength, balance, endurance for safe ADLs and gait.       If plan is discharge home, recommend the following: A little help with walking and/or transfers;A little help with bathing/dressing/bathroom;Help with stairs or ramp for entrance;Assistance with cooking/housework   Can travel by private vehicle     Yes  Equipment Recommendations  None recommended by PT    Recommendations for Other Services       Precautions / Restrictions Precautions Precautions: Fall Recall of Precautions/Restrictions: Intact Restrictions Weight Bearing Restrictions Per Provider Order: No     Mobility  Bed Mobility Overal bed mobility: Needs Assistance Bed Mobility: Supine to Sit     Supine to sit: Contact guard, Min assist     General bed mobility comments: increased time, labored movement    Transfers Overall transfer level: Needs assistance Equipment used:  Rolling walker (2 wheels) Transfers: Sit to/from Stand, Bed to chair/wheelchair/BSC Sit to Stand: Min assist   Step pivot transfers: Min assist       General transfer comment: increased time, labored movement    Ambulation/Gait Ambulation/Gait assistance: Min assist Gait Distance (Feet): 25 Feet Assistive device: Rolling walker (2 wheels) Gait Pattern/deviations: Decreased step length - right, Decreased step length - left, Decreased stride length Gait velocity: slow     General Gait Details: slow labored movement requiring verbal/tactile cue to step closer to RW with fair carryover, limited mostly due to fatigue, on 3 LPM O2 with SpO2 at 95% after ambulating in room   Stairs             Wheelchair Mobility     Tilt Bed    Modified Rankin (Stroke Patients Only)       Balance Overall balance assessment: Needs assistance Sitting-balance support: Feet supported, No upper extremity supported Sitting balance-Leahy Scale: Fair Sitting balance - Comments: fair/good seated at EOB   Standing balance support: Reliant on assistive device for balance, During functional activity, Bilateral upper extremity supported Standing balance-Leahy Scale: Poor Standing balance comment: fair/poor using RW                            Communication Communication Communication: No apparent difficulties  Cognition Arousal: Alert Behavior During Therapy: WFL for tasks assessed/performed   PT - Cognitive impairments: No apparent impairments                         Following commands: Intact      Cueing  Cueing Techniques: Verbal cues, Tactile cues  Exercises      General Comments        Pertinent Vitals/Pain Pain Assessment Pain Assessment: No/denies pain    Home Living                          Prior Function            PT Goals (current goals can now be found in the care plan section) Acute Rehab PT Goals Patient Stated Goal: return  home with family to assist PT Goal Formulation: With patient Time For Goal Achievement: 01/16/24 Potential to Achieve Goals: Good Progress towards PT goals: Progressing toward goals    Frequency    Min 3X/week      PT Plan      Co-evaluation              AM-PAC PT "6 Clicks" Mobility   Outcome Measure  Help needed turning from your back to your side while in a flat bed without using bedrails?: None Help needed moving from lying on your back to sitting on the side of a flat bed without using bedrails?: A Little Help needed moving to and from a bed to a chair (including a wheelchair)?: A Little Help needed standing up from a chair using your arms (e.g., wheelchair or bedside chair)?: A Little Help needed to walk in hospital room?: A Lot Help needed climbing 3-5 steps with a railing? : A Lot 6 Click Score: 17    End of Session   Activity Tolerance: Patient tolerated treatment well;Patient limited by fatigue Patient left: in chair;with call bell/phone within reach Nurse Communication: Mobility status PT Visit Diagnosis: Unsteadiness on feet (R26.81);Other abnormalities of gait and mobility (R26.89);Muscle weakness (generalized) (M62.81)     Time: 1610-9604 PT Time Calculation (min) (ACUTE ONLY): 22 min  Charges:    $Gait Training: 8-22 mins $Therapeutic Activity: 8-22 mins PT General Charges $$ ACUTE PT VISIT: 1 Visit                     3:34 PM, 01/12/24 [name], MPT Physical Therapist with [facility] [phone] office [phone] mobile phone

## 2024-01-12 NOTE — TOC Progression Note (Signed)
 30 Day Note   Patient Details  Name: [name] MRN: [id number] Date of Birth: 1951/04/21  Transition of Care [facility]) CM/SW Contact  Orelia Binet, RN Phone Number: 01/12/2024, 3:29 PM  To whom it May Concern: Please be advised that the above name patient will require a short-term nursing home stay- anticipated 30 days or less rehabilitation and strengthening. The plan is for return home.

## 2024-01-13 DIAGNOSIS — R112 Nausea with vomiting, unspecified: Secondary | ICD-10-CM | POA: Diagnosis not present

## 2024-01-13 LAB — CBC
HCT: 29 % — ABNORMAL LOW (ref 36.0–46.0)
Hemoglobin: 8.9 g/dL — ABNORMAL LOW (ref 12.0–15.0)
MCH: 31.2 pg (ref 26.0–34.0)
MCHC: 30.7 g/dL (ref 30.0–36.0)
MCV: 101.8 fL — ABNORMAL HIGH (ref 80.0–100.0)
Platelets: 380 10*3/uL (ref 150–400)
RBC: 2.85 MIL/uL — ABNORMAL LOW (ref 3.87–5.11)
RDW: 14.9 % (ref 11.5–15.5)
WBC: 10.8 10*3/uL — ABNORMAL HIGH (ref 4.0–10.5)
nRBC: 0.5 % — ABNORMAL HIGH (ref 0.0–0.2)

## 2024-01-13 LAB — COMPREHENSIVE METABOLIC PANEL WITH GFR
ALT: 18 U/L (ref 0–44)
AST: 20 U/L (ref 15–41)
Albumin: 2.2 g/dL — ABNORMAL LOW (ref 3.5–5.0)
Alkaline Phosphatase: 104 U/L (ref 38–126)
Anion gap: 9 (ref 5–15)
BUN: 9 mg/dL (ref 8–23)
CO2: 23 mmol/L (ref 22–32)
Calcium: 7.6 mg/dL — ABNORMAL LOW (ref 8.9–10.3)
Chloride: 109 mmol/L (ref 98–111)
Creatinine, Ser: 0.76 mg/dL (ref 0.44–1.00)
GFR, Estimated: >60 mL/min (ref >60)
Glucose, Bld: 91 mg/dL (ref 70–99)
Potassium: 4.6 mmol/L (ref 3.5–5.1)
Sodium: 141 mmol/L (ref 135–145)
Total Bilirubin: 0.7 mg/dL (ref 0.0–1.2)
Total Protein: 5.7 g/dL — ABNORMAL LOW (ref 6.5–8.1)

## 2024-01-13 LAB — GLUCOSE, CAPILLARY
Glucose-Capillary: 119 mg/dL — ABNORMAL HIGH (ref 70–99)
Glucose-Capillary: 131 mg/dL — ABNORMAL HIGH (ref 70–99)
Glucose-Capillary: 148 mg/dL — ABNORMAL HIGH (ref 70–99)
Glucose-Capillary: 99 mg/dL (ref 70–99)

## 2024-01-13 LAB — PHOSPHORUS: Phosphorus: 2.3 mg/dL — ABNORMAL LOW (ref 2.5–4.6)

## 2024-01-13 LAB — ACTH STIMULATION, 3 TIME POINTS
Cortisol, 30 Min: 25.5 ug/dL
Cortisol, 60 Min: 28.7 ug/dL
Cortisol, Base: 11.6 ug/dL

## 2024-01-13 MED ORDER — POTASSIUM & SODIUM PHOSPHATES 280-160-250 MG PO PACK
1.0000 | PACK | Freq: Three times a day (TID) | ORAL | Status: AC
  Administered 2024-01-13 (×3): 1 via ORAL
  Filled 2024-01-13 (×3): qty 1

## 2024-01-13 MED ORDER — GUAIFENESIN-DM 100-10 MG/5ML PO SYRP
10.0000 mL | ORAL_SOLUTION | Freq: Four times a day (QID) | ORAL | Status: DC
Start: 2024-01-13 — End: 2024-01-14
  Administered 2024-01-13 – 2024-01-14 (×2): 10 mL via ORAL
  Filled 2024-01-13 (×3): qty 10

## 2024-01-13 MED ORDER — ASPIRIN 81 MG PO CHEW
81.0000 mg | CHEWABLE_TABLET | Freq: Every day | ORAL | Status: DC
  Administered 2024-01-14: 81 mg via ORAL
  Filled 2024-01-13: qty 1

## 2024-01-13 MED ORDER — METOPROLOL SUCCINATE ER 25 MG PO TB24
50.0000 mg | ORAL_TABLET | Freq: Every day | ORAL | Status: DC
  Administered 2024-01-13 – 2024-01-14 (×2): 50 mg via ORAL
  Filled 2024-01-13 (×2): qty 2

## 2024-01-13 MED ORDER — FAMOTIDINE 20 MG PO TABS
20.0000 mg | ORAL_TABLET | Freq: Every day | ORAL | Status: DC
  Administered 2024-01-13 – 2024-01-14 (×2): 20 mg via ORAL
  Filled 2024-01-13 (×2): qty 1

## 2024-01-13 NOTE — TOC Progression Note (Signed)
 Transition of Care [facility]) - Progression Note    Patient Details  Name: [name] MRN: [id number] Date of Birth: 03-04-1951  Transition [facility]) CM/SW Contact  Grandville Lax, [place] Phone Number: 01/13/2024, 12:50 PM  Clinical Narrative:    CSW spoke with pts son to review if they prefer SNF vs HH. Pts son states they prefer for pt to return home with [facility] at this time. CSW spoke with pt at bedside to review updated plan, pt states she agrees she would like to go home with [facility] services. HH has been arranged with [facility], orders placed by MD. TOC to follow.   Expected Discharge Plan: Home w Home Health Services Barriers to Discharge: Continued Medical Work up  Expected Discharge Plan and Services     Post Acute Care Choice: Home Health Living arrangements for the past 2 months: Single Family Home                 DME Arranged: N/A         HH Arranged: PT, OT, Social Work, Charity fundraiser HH Agency: [facility] Date HH Agency Contacted: 01/11/24 Time HH Agency Contacted: 1019 Representative spoke with at [facility] Agency: Randel Buss   Social Determinants of Health (SDOH) Interventions SDOH Screenings   Food Insecurity: No Food Insecurity (01/07/2024)  Housing: Low Risk  (01/11/2024)  Transportation Needs: No Transportation Needs (01/07/2024)  Utilities: Not At Risk (01/07/2024)  Social Connections: Moderately Isolated (01/07/2024)  Tobacco Use: Medium Risk (01/07/2024)    Readmission Risk Interventions    01/11/2024   10:17 AM 01/07/2024   11:02 AM  Readmission Risk Prevention Plan  Transportation Screening Complete Complete  PCP or Specialist Appt within 3-5 Days  Not Complete  HRI or Home Care Consult  Complete  Social Work Consult for Recovery Care Planning/Counseling  Complete  Palliative Care Screening  Complete  Medication Review Oceanographer) Complete Complete  HRI or Home Care Consult Complete   SW Recovery Care/Counseling Consult Complete   Palliative Care  Screening Not Applicable   Skilled Nursing Facility Not Applicable

## 2024-01-13 NOTE — Progress Notes (Signed)
 Patient declined BIPAP again tonight. Unit pulled from room; left circuit and mask at bedside.

## 2024-01-13 NOTE — Progress Notes (Signed)
 Mobility Specialist Progress Note:    01/13/24 1134  Mobility  Activity Transferred from bed to chair  Level of Assistance Contact guard assist, steadying assist  Assistive Device Front wheel walker  Distance Ambulated (ft) 5 ft  Range of Motion/Exercises Active;All extremities  Activity Response Tolerated well  Mobility Referral Yes  Mobility visit 1 Mobility  Mobility Specialist Start Time (ACUTE ONLY) 1115  Mobility Specialist Stop Time (ACUTE ONLY) 1135  Mobility Specialist Time Calculation (min) (ACUTE ONLY) 20 min   Pt received in bed, agreeable to mobility. Required CGA to stand and transfer with RW. Tolerated well, asx throughout. Cal bell in reach, all needs met.  Glinda Lapping Mobility Specialist Please contact via Special educational needs teacher or  Rehab office at [phone]

## 2024-01-13 NOTE — Progress Notes (Signed)
 [facility] PROGRESS NOTE  [name] (DOB: [birth year]) YQM:[id number] PCP: Medicine, [facility]  Brief Narrative: [name] is a 81 yoM PMH of hypertension, chronic systolic heart failure, depression, CVA, anxiety, and COPD with chronic hypoxemia on 3 L nasal cannula oxygen  admitted on 01/07/24 with intractable emesis and right flank pain presumed related to recent rib fractures from recent fall   Subjective: Had better tolerance to orthostatic changes yesterday, though was more weak diffusely with PT, so is interested in rehabilitation. Son at bedside. Pt has no new complaints.   Objective: BP (!) 178/84 (BP Location: Right Arm)   Pulse (!) 115   Temp 98.8 F (37.1 C) (Oral)   Resp 16   Ht 5\' 2"  (1.575 m)   Wt 62 kg   SpO2 100%   BMI 25.00 kg/m   Gen: No distress, but very frail  Pulm: Clear, nonlabored, tachypneic  CV: RRR, no significant edema GI: Soft, NT, ND, +BS Neuro: Alert and oriented. No new focal deficits. Ext: Warm, no deformities. Skin: No new rashes, lesions or ulcers on visualized skin   Assessment & Plan: Intractable nausea and vomiting: Improved. Note admission for same early March 2025.  - If recurrent, consider GI evaluation.  - Will allow unrestricted diet. Support with antiemetics. Son hopeful that she will take better po at home.     HFimprEF--- patient with history of chronic systolic dysfunction CHF EF previously 35 to 40% , repeat echo on 04/07/2023 with EF improved to 50%  --patient was admitted with nausea vomiting and dehydration, she received IV fluids initially, went into pulmonary edema, then received IV lasix  with hypoxia improved and repeat chest x-ray on 01/10/2024 without pulmonary edema - Holding IVF and diuretics going forward.   CAD s/p RCA PCI--- no ACS type symptoms - Continue aspirin , plavix , atorvastatin . Restart cardioselective metoprolol  as below.    Severe emphysema/COPD: Initially treated for AECOPD with  solumedrol. Hypoxia improved, oxygen  requirement is back to her baseline of 3 L per/min via nasal cannula - Continue mucolytics and azithromycin    Acute on chronic hypoxic respiratory failure: Due to IVF initially and has since returned to baseline. No exam-based evidence of recurrent pulmonary edema nor AECOPD.   - Continue home O2.    Hypotension, orthostatic hypotension, on essential HTN: - CT head nonacute 4/22 after near syncopal episode.  - Given hx chronic HFrEF and current orthostatic intolerance, echo was repeated 4/23 showing LVEF 60-65%, indeterminate diastolic function, normal RV and no significant valvulopathy.  - We've been holding home clonidine , losartan  and metoprolol . Notes report GDMT was limited by hypotension. Now that BP has risen again, stop midodrine  and restart metoprolol .  - Cortisol was checked in setting of solumedrol and was 5.3, Recheck the following morning was adequate at 11 with appropriate response to ACTH  stim up to 25 and 28 at 30 and 60 minutes respectively.    Lactic acidosis with reactive mild leukocytosis: Suspect hemoconcentration/reactive process. LA improved. No sepsis suspected. Given IV steroids previously, doubt there is utility in trending leukocytosis at this time.   GERD:  - Continue PPI   Depression/Anxiety:  - Continue SSRI, remeron    Generalized weakness and deconditioning:  - Home health therapies ordered and arranged to assist at home, she has good assistance at home with her son but with ongoing hospital stay is growing more deconditioned, interested in pursuing rehabilitation and this was also recommended by PT after reevaluation yesterday. TOC consulted and following.    Hypophosphatemia:  Likely related in inadequate po intake.  - Supplement again today and monitor.   Hypokalemia: Resolved with replacement   Staph epidermidis blood culture contaminant.    April Heckle, MD [facility] [email or web address] 01/13/2024, 11:14 AM

## 2024-01-13 NOTE — Plan of Care (Signed)
?  Problem: Clinical Measurements: ?Goal: Will remain free from infection ?Outcome: Progressing ?Goal: Diagnostic test results will improve ?Outcome: Progressing ?Goal: Respiratory complications will improve ?Outcome: Progressing ?  ?

## 2024-01-14 DIAGNOSIS — R112 Nausea with vomiting, unspecified: Secondary | ICD-10-CM | POA: Diagnosis not present

## 2024-01-14 NOTE — Care Management Important Message (Signed)
 Important Message  Patient Details  Name: [name] MRN: [id number] Date of Birth: 1951-08-12   Important Message Given:  Yes - Medicare IM     Darci Lykins L Prescilla Monger 01/14/2024, 11:48 AM

## 2024-01-14 NOTE — TOC Transition Note (Signed)
 Transition of Care [facility]) - Discharge Note   Patient Details  Name: ISRAELLA HUBERT MRN: [id number] Date of Birth: 03-13-1951  Transition of Care [facility]) CM/SW Contact:  Grandville Lax, LCSWA Phone Number: 01/14/2024, 10:26 AM   Clinical Narrative:    CSW updated that pt will D/C home today. MD placed HH orders. CSW updated Randel Buss with [facility] of plan for D/C home today. TOC signing off.   Final next level of care: Home w Home Health Services Barriers to Discharge: Barriers Resolved   Patient Goals and CMS Choice Patient states their goals for this hospitalization and ongoing recovery are:: return home CMS Medicare.gov Compare Post Acute Care list provided to:: Patient Choice offered to / list presented to : Patient      Discharge Placement                Patient to be transferred to facility by: POV Name of family member notified: Kaly-patient Patient and family notified of of transfer: 01/11/24  Discharge Plan and Services Additional resources added to the After Visit Summary for       Post Acute Care Choice: Home Health          DME Arranged: N/A         HH Arranged: PT, OT, RN, Social Work [employer] Agency: [facility] Date [facility] Agency Contacted: 01/14/24 Time HH Agency Contacted: [phone] Representative spoke with at [facility] Agency: Randel Buss  Social Drivers of Health (SDOH) Interventions SDOH Screenings   Food Insecurity: No Food Insecurity (01/07/2024)  Housing: Low Risk  (01/11/2024)  Transportation Needs: No Transportation Needs (01/07/2024)  Utilities: Not At Risk (01/07/2024)  Social Connections: Moderately Isolated (01/07/2024)  Tobacco Use: Medium Risk (01/07/2024)     Readmission Risk Interventions    01/11/2024   10:17 AM 01/07/2024   11:02 AM  Readmission Risk Prevention Plan  Transportation Screening Complete Complete  PCP or Specialist Appt within 3-5 Days  Not Complete  HRI or Home Care Consult  Complete  Social Work Consult for Recovery Care  Planning/Counseling  Complete  Palliative Care Screening  Complete  Medication Review Oceanographer) Complete Complete  HRI or Home Care Consult Complete   SW Recovery Care/Counseling Consult Complete   Palliative Care Screening Not Applicable   Skilled Nursing Facility Not Applicable

## 2024-01-14 NOTE — Discharge Summary (Signed)
 Physician Discharge Summary   Patient: [name] MRN: [id number] DOB: April 05, 1951  Admit date:     01/06/2024  Discharge date: 01/14/2024  Discharge Physician: Wynetta Heckle   PCP: Medicine, [facility]   Recommendations at discharge:  Follow up with PCP in 1-2 weeks. Monitor BP.  Discharge Diagnoses: Principal Problem:   Intractable nausea and vomiting Active Problems:   Hypokalemia   Pressure injury of skin   Chronic hypoxic respiratory failure (HCC)   Chronic systolic CHF (congestive heart failure) (HCC)   Essential hypertension   Dehydration   Noninfectious gastroenteritis   Gastroesophageal reflux disease   Chronic kidney disease, stage 3a (HCC)   Lactic acidosis   Hospital Course: [name] is a 9 yoM PMH of hypertension, chronic systolic heart failure, depression, CVA, anxiety, and COPD with chronic hypoxemia on 3 L nasal cannula oxygen  admitted on 01/07/24 with intractable emesis and right flank pain presumed related to recent rib fractures from recent fall. See details of hospitalization below.   Assessment and Plan: Intractable nausea and vomiting: Improved. Note admission for same early March 2025.  - If recurrent, consider GI evaluation.  - Will allow unrestricted diet. Support with antiemetics. Son hopeful that she will take better po at home.     HFimprEF--- patient with history of chronic systolic dysfunction CHF EF previously 35 to 40% , repeat echo on 04/07/2023 with EF improved to 50%  --patient was admitted with nausea vomiting and dehydration, she received IV fluids initially, went into pulmonary edema, then received IV lasix  with hypoxia improved and repeat chest x-ray on 01/10/2024 without pulmonary edema - Holding IVF and diuretics for several days, appears roughly euvolemic at discharge, will restart her home diuretic.    CAD s/p RCA PCI--- no ACS type symptoms - Continue aspirin , plavix , atorvastatin . Restarted cardioselective metoprolol   as below.    Severe emphysema/COPD: Initially treated for AECOPD with solumedrol. Hypoxia improved, oxygen  requirement is back to her baseline of 3 L per/min via nasal cannula    Acute on chronic hypoxic respiratory failure: Due to IVF initially and has since returned to baseline. No exam-based evidence of recurrent pulmonary edema nor AECOPD.   - Continue home O2.    Hypotension, orthostatic hypotension, on essential HTN: - CT head nonacute 4/22 after near syncopal episode.  - Given hx chronic HFrEF and current orthostatic intolerance, echo was repeated 4/23 showing LVEF 60-65%, indeterminate diastolic function, normal RV and no significant valvulopathy.  - We've been holding home clonidine , losartan  and metoprolol . Notes report GDMT was limited by hypotension. Now that BP has risen durably, we stopped midodrine , still BP elevated, so will restart all home medications. Monitored on this x24 hours and stable for discharge.  - Cortisol was checked in setting of solumedrol and was 5.3, Recheck the following morning was adequate at 11 with appropriate response to ACTH  stim up to 25 and 28 at 30 and 60 minutes respectively.    Lactic acidosis with reactive mild leukocytosis: Suspect hemoconcentration/reactive process. LA improved. No sepsis suspected. Given IV steroids previously, doubt there is utility in trending leukocytosis at this time.    GERD:  - Continue PPI   Depression/Anxiety:  - Continue SSRI, remeron    Generalized weakness and deconditioning:  - Home health therapies ordered and arranged to assist at home, she has good assistance at home with her son   Hypophosphatemia: Likely related in inadequate po intake.  - Suggest recheck at follow up.   Hypokalemia: Resolved with  replacement, recheck at follow up.   Staph epidermidis blood culture contaminant.    Consultants: None Procedures performed: None  Disposition: Home Diet recommendation:  Cardiac diet DISCHARGE  MEDICATION: Allergies as of 01/14/2024       Reactions   Aspirin  Swelling, Other (See Comments)   Only in large doses will cause a reaction   Bee Venom Swelling   Severe swelling   Contrast Media [iodinated Contrast Media] Swelling   Lisinopril Swelling   Motrin [ibuprofen] Swelling   Penicillins Anaphylaxis   Tolerated Rocephin  Jan 2025   Dilaudid  [hydromorphone  Hcl] Itching        Medication List     TAKE these medications    acetaminophen  650 MG CR tablet Commonly known as: TYLENOL  Take 1,300 mg by mouth every 8 (eight) hours as needed for pain.   aspirin  EC 81 MG tablet Take 81 mg by mouth daily. Swallow whole.   citalopram  20 MG tablet Commonly known as: CELEXA  Take 20 mg by mouth daily.   cloNIDine  0.3 MG tablet Commonly known as: CATAPRES  Take 0.3 mg by mouth 2 (two) times daily.   clopidogrel  75 MG tablet Commonly known as: PLAVIX  Take 1 tablet (75 mg total) by mouth daily.   cyanocobalamin  500 MCG tablet Commonly known as: VITAMIN B12 Take 1 tablet (500 mcg total) by mouth daily.   fluticasone 50 MCG/ACT nasal spray Commonly known as: FLONASE Place 1 spray into both nostrils daily as needed for rhinitis or allergies.   folic acid  1 MG tablet Commonly known as: FOLVITE  Take 1 tablet (1 mg total) by mouth daily.   Ipratropium-Albuterol  20-100 MCG/ACT Aers respimat Commonly known as: COMBIVENT  Inhale 1 puff into the lungs every 6 (six) hours as needed for wheezing or shortness of breath.   losartan  25 MG tablet Commonly known as: COZAAR  Take 12.5 mg by mouth daily.   metoprolol  succinate 50 MG 24 hr tablet Commonly known as: Toprol  XL Take 1 tablet (50 mg total) by mouth daily. Take with or immediately following a meal.   mirtazapine  7.5 MG tablet Commonly known as: REMERON  Take 7.5 mg by mouth at bedtime.   montelukast  10 MG tablet Commonly known as: SINGULAIR  Take 10 mg by mouth at bedtime.   ondansetron  4 MG tablet Commonly known as:  Zofran  Take 1 tablet (4 mg total) by mouth every 8 (eight) hours as needed for nausea or vomiting.   oxyCODONE -acetaminophen  5-325 MG tablet Commonly known as: Percocet Take 1 tablet by mouth every 4 (four) hours as needed.   pantoprazole  40 MG tablet Commonly known as: PROTONIX  Take 40 mg by mouth daily.   Stiolto Respimat  2.5-2.5 MCG/ACT Aers Generic drug: Tiotropium Bromide-Olodaterol Inhale 2 puffs into the lungs daily.   torsemide  20 MG tablet Commonly known as: DEMADEX  Take 40 mg by mouth 2 (two) times daily.   traMADol  50 MG tablet Commonly known as: ULTRAM  Take 50 mg by mouth 2 (two) times daily as needed for moderate pain (pain score 4-6).        Follow-up Information     Care, [facility] Follow up.   Specialty: Home Health Services Why: Home health will call to schedule your first home visit. Contact information: [address] [place] [address] [phone]         Erin Havers, MD .   Specialty: Psychiatry Contact information: [address] [place] [address] [phone]         Medicine, [facility] Follow  up.   Specialty: Internal Medicine Contact information: [address] [place] [address] [phone]                Discharge Exam: Cleavon Curls Weights   01/12/24 0500 01/13/24 0417 01/14/24 0348  Weight: 63.5 kg 62 kg 61.6 kg  Frail pleasant female in no acute distress Nonlabored, bibasilar crackles improve with deep breaths.  RRR, no MRG, trace edema  Condition at discharge: stable  The results of significant diagnostics from this hospitalization (including imaging, microbiology, ancillary and laboratory) are listed below for reference.   Imaging Studies: ECHOCARDIOGRAM COMPLETE Result Date: 01/12/2024    ECHOCARDIOGRAM REPORT   Patient Name:   KELCE BOUTON Date of Exam: 01/12/2024 Medical Rec #:  [id number]         Height:       62.0 in Accession #:    [id number]        Weight:        140.0 lb Date of Birth:  22-May-1951         BSA:          1.643 m Patient Age:    72 years          BP:           124/70 mmHg Patient Gender: F                 HR:           93 bpm. Exam Location:  [facility] Procedure: 2D Echo, Color Doppler and Cardiac Doppler (Both Spectral and Color            Flow Doppler were utilized during procedure). Indications:    R06.9 DOE  History:        Patient has prior history of Echocardiogram examinations, most                 recent 04/07/2023. CHF, CAD, Stroke and COPD; Risk                 Factors:Hypertension.  Sonographer:    Andrena Bang Referring Phys: [id number] COURAGE EMOKPAE  Sonographer Comments: No IV access for Definity. IMPRESSIONS  1. Left ventricular ejection fraction, by estimation, is 60 to 65%. The left ventricle has normal function. Left ventricular endocardial border not optimally defined to evaluate regional wall motion. Left ventricular diastolic parameters are indeterminate.  2. Right ventricular systolic function is normal. The right ventricular size is normal.  3. The mitral valve was not well visualized. No evidence of mitral valve regurgitation. No evidence of mitral stenosis.  4. The aortic valve was not well visualized. Aortic valve regurgitation is not visualized. No aortic stenosis is present. FINDINGS  Left Ventricle: Left ventricular ejection fraction, by estimation, is 60 to 65%. The left ventricle has normal function. Left ventricular endocardial border not optimally defined to evaluate regional wall motion. The left ventricular internal cavity size was normal in size. There is no left ventricular hypertrophy. Left ventricular diastolic parameters are indeterminate. Right Ventricle: The right ventricular size is normal. Right vetricular wall thickness was not well visualized. Right ventricular systolic function is normal. Left Atrium: Left atrial size was normal in size. Right Atrium: Right atrial size was normal in size. Pericardium: There is  no evidence of pericardial effusion. Mitral Valve: The mitral valve was not well visualized. No evidence of mitral valve regurgitation. No evidence of mitral valve stenosis. Tricuspid Valve: The tricuspid valve is normal in structure. Tricuspid valve regurgitation  is not demonstrated. No evidence of tricuspid stenosis. Aortic Valve: The aortic valve was not well visualized. Aortic valve regurgitation is not visualized. No aortic stenosis is present. Aortic valve mean gradient measures 3.0 mmHg. Aortic valve peak gradient measures 6.7 mmHg. Aortic valve area, by VTI measures 1.04 cm. Pulmonic Valve: The pulmonic valve was not well visualized. Pulmonic valve regurgitation is not visualized. No evidence of pulmonic stenosis. Aorta: The aortic root and ascending aorta are structurally normal, with no evidence of dilitation. IAS/Shunts: The interatrial septum was not well visualized.  LEFT VENTRICLE PLAX 2D LVIDd:         4.30 cm   Diastology LVIDs:         2.90 cm   LV e' medial:    6.31 cm/s LV PW:         0.90 cm   LV E/e' medial:  7.7 LV IVS:        0.90 cm   LV e' lateral:   4.90 cm/s LVOT diam:     1.40 cm   LV E/e' lateral: 9.9 LV SV:         23 LV SV Index:   14 LVOT Area:     1.54 cm  RIGHT VENTRICLE RV S prime:     11.60 cm/s TAPSE (M-mode): 1.1 cm LEFT ATRIUM             Index LA Vol (A2C):   26.1 ml 15.89 ml/m LA Vol (A4C):   21.9 ml 13.33 ml/m LA Biplane Vol: 24.0 ml 14.61 ml/m  AORTIC VALVE AV Area (Vmax):    1.25 cm AV Area (Vmean):   1.12 cm AV Area (VTI):     1.04 cm AV Vmax:           129.00 cm/s AV Vmean:          85.800 cm/s AV VTI:            0.217 m AV Peak Grad:      6.7 mmHg AV Mean Grad:      3.0 mmHg LVOT Vmax:         105.02 cm/s LVOT Vmean:        62.202 cm/s LVOT VTI:          0.147 m LVOT/AV VTI ratio: 0.68  AORTA Ao Root diam: 2.50 cm Ao Asc diam:  2.00 cm MITRAL VALVE MV Area (PHT): 4.44 cm    SHUNTS MV Decel Time: 171 msec    Systemic VTI:  0.15 m MV E velocity: 48.70 cm/s   Systemic Diam: 1.40 cm MV A velocity: 96.30 cm/s MV E/A ratio:  0.51 Armida Lander MD Electronically signed by Armida Lander MD Signature Date/Time: 01/12/2024/1:36:18 PM    Final    CT HEAD WO CONTRAST ( ) Result Date: 01/11/2024 CLINICAL DATA:  Syncope EXAM: CT HEAD WITHOUT CONTRAST TECHNIQUE: Contiguous axial images were obtained from the base of the skull through the vertex without intravenous contrast. RADIATION DOSE REDUCTION: This exam was performed according to the departmental dose-optimization program which includes automated exposure control, adjustment of the mA and/or kV according to patient size and/or use of iterative reconstruction technique. COMPARISON:  06/10/2022 FINDINGS: Brain: No mass,hemorrhage or extra-axial collection. Normal appearance of the parenchyma and CSF spaces. Vascular: No hyperdense vessel or unexpected vascular calcification. Skull: The visualized skull base, calvarium and extracranial soft tissues are normal. Sinuses/Orbits: No fluid levels or advanced mucosal thickening of the visualized paranasal sinuses. No mastoid or middle ear effusion.  Normal orbits. Other: None. IMPRESSION: Normal head CT. Electronically Signed   By: Juanetta Nordmann M.D.   On: 01/11/2024 20:00   DG CHEST PORT 1 VIEW Result Date: 01/10/2024 CLINICAL DATA:  [id number] Dyspnea and respiratory abnormalities [id number] EXAM: PORTABLE CHEST 1 VIEW COMPARISON:  January 08, 2024 FINDINGS: Unchanged cardiomegaly. Chronic coarsening of the pulmonary interstitium, unchanged, with severe bullous change in the right upper lung zone, consistent with emphysema. No new airspace consolidation or pleural effusion. Aortic atherosclerosis. No pneumothorax. IMPRESSION: Emphysema.  Otherwise, no significant interval change to the lungs. Electronically Signed   By: Rance Burrows M.D.   On: 01/10/2024 10:00   DG Chest 1 View Result Date: 01/08/2024 CLINICAL DATA:  Shortness of breath EXAM: CHEST  1 VIEW COMPARISON:   01/06/2024 FINDINGS: Stable cardiomediastinal silhouette. Chronic bronchitic change and hyperinflation. Bullous change in the right upper lobe. Interstitial coarsening has increased compared to 01/06/2024. No pleural effusion or pneumothorax. IMPRESSION: Increased interstitial coarsening compared to 01/06/2024 which may be due to edema or infection superimposed on a background of advanced emphysema. Electronically Signed   By: Rozell Cornet M.D.   On: 01/08/2024 21:13   CT ABDOMEN PELVIS W CONTRAST Result Date: 01/07/2024 CLINICAL DATA:  Nausea and vomiting for several days with right-sided abdominal pain, initial encounter EXAM: CT ANGIOGRAPHY CHEST CT ABDOMEN AND PELVIS WITH CONTRAST TECHNIQUE: Multidetector CT imaging of the chest was performed using the standard protocol during bolus administration of intravenous contrast. Multiplanar CT image reconstructions and MIPs were obtained to evaluate the vascular anatomy. Multidetector CT imaging of the abdomen and pelvis was performed using the standard protocol during bolus administration of intravenous contrast. RADIATION DOSE REDUCTION: This exam was performed according to the departmental dose-optimization program which includes automated exposure control, adjustment of the mA and/or kV according to patient size and/or use of iterative reconstruction technique. CONTRAST:  OMNIPAQUE  IOHEXOL  350 MG/ML SOLN COMPARISON:  Chest x-ray from the previous day, CT from 12/28/2023 FINDINGS: CTA CHEST FINDINGS Cardiovascular: Atherosclerotic calcifications of the thoracic aorta are noted. No aneurysmal dilatation or dissection is seen. No cardiac enlargement is noted. Scattered coronary calcifications are seen. The pulmonary artery shows a normal branching pattern bilaterally. No intraluminal filling defect to suggest pulmonary embolism is noted. Mediastinum/Nodes: Thoracic inlet is within normal limits. No hilar or mediastinal adenopathy is noted. The esophagus as  visualized is within normal limits. Lungs/Pleura: Diffuse emphysematous changes are noted. These are worse on the right than the left. No parenchymal nodules are seen. Scattered areas of scarring are noted. No effusion is seen. Musculoskeletal: Acute rib fractures are noted on the right involving the lateral aspect of the second, ninth and tenth ribs laterally. No significant displacement is seen. No pneumothorax is noted. Some healing subacute rib fractures are seen on the right as well. Review of the MIP images confirms the above findings. CT ABDOMEN and PELVIS FINDINGS Hepatobiliary: No focal liver abnormality is seen. Status post cholecystectomy. No biliary dilatation. Pancreas: Unremarkable. No pancreatic ductal dilatation or surrounding inflammatory changes. Spleen: Normal in size without focal abnormality. Adrenals/Urinary Tract: Adrenal glands are within normal limits. Kidneys are well visualized bilaterally. Simple cysts are noted in the left kidney stable from the prior exam. No calculi or obstructive changes are seen. The bladder is well distended. Stomach/Bowel: The appendix has been surgically removed. No obstructive or inflammatory changes of the colon are seen. Stomach and small bowel are within normal limits. Vascular/Lymphatic: Aortic atherosclerosis. 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 Reconstitution at the level of the iliac bifurcation is noted. No enlarged abdominal or pelvic lymph nodes. Reproductive: Status post hysterectomy. No adnexal masses. Other: No abdominal wall hernia or abnormality. No abdominopelvic ascites. Musculoskeletal: Mild degenerative changes of the lumbar spine are seen. Review of the MIP images confirms the above findings. IMPRESSION: CTA of the chest: No evidence of pulmonary emboli. Multiple acute rib fractures on the right involving the second, ninth and tenth ribs laterally. Some healing fractures are noted as well. Severe emphysematous  changes. 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 No acute abnormality in the abdomen. Electronically Signed   By: Violeta Grey M.D.   On: 01/07/2024 02:54   CT Angio Chest PE W and/or Wo Contrast Result Date: 01/07/2024 CLINICAL DATA:  Nausea and vomiting for several days with right-sided abdominal pain, initial encounter EXAM: CT ANGIOGRAPHY CHEST CT ABDOMEN AND PELVIS WITH CONTRAST TECHNIQUE: Multidetector CT imaging of the chest was performed using the standard protocol during bolus administration of intravenous contrast. Multiplanar CT image reconstructions and MIPs were obtained to evaluate the vascular anatomy. Multidetector CT imaging of the abdomen and pelvis was performed using the standard protocol during bolus administration of intravenous contrast. RADIATION DOSE REDUCTION: This exam was performed according to the departmental dose-optimization program which includes automated exposure control, adjustment of the mA and/or kV according to patient size and/or use of iterative reconstruction technique. CONTRAST:  OMNIPAQUE  IOHEXOL  350 MG/ML SOLN COMPARISON:  Chest x-ray from the previous day, CT from 12/28/2023 FINDINGS: CTA CHEST FINDINGS Cardiovascular: Atherosclerotic calcifications of the thoracic aorta are noted. No aneurysmal dilatation or dissection is seen. No cardiac enlargement is noted. Scattered coronary calcifications are seen. The pulmonary artery shows a normal branching pattern bilaterally. No intraluminal filling defect to suggest pulmonary embolism is noted. Mediastinum/Nodes: Thoracic inlet is within normal limits. No hilar or mediastinal adenopathy is noted. The esophagus as visualized is within normal limits. Lungs/Pleura: Diffuse emphysematous changes are noted. These are worse on the right than the left. No parenchymal nodules are seen. Scattered areas of scarring are noted. No effusion is seen. Musculoskeletal: Acute rib  fractures are noted on the right involving the lateral aspect of the second, ninth and tenth ribs laterally. No significant displacement is seen. No pneumothorax is noted. Some healing subacute rib fractures are seen on the right as well. Review of the MIP images confirms the above findings. CT ABDOMEN and PELVIS FINDINGS Hepatobiliary: No focal liver abnormality is seen. Status post cholecystectomy. No biliary dilatation. Pancreas: Unremarkable. No pancreatic ductal dilatation or surrounding inflammatory changes. Spleen: Normal in size without focal abnormality. Adrenals/Urinary Tract: Adrenal glands are within normal limits. Kidneys are well visualized bilaterally. Simple cysts are noted in the left kidney stable from the prior exam. No calculi or obstructive changes are seen. The bladder is well distended. Stomach/Bowel: The appendix has been surgically removed. No obstructive or inflammatory changes of the colon are seen. Stomach and small bowel are within normal limits. Vascular/Lymphatic: Aortic atherosclerosis. 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 Reconstitution at the level of the iliac bifurcation is noted. No enlarged abdominal or pelvic lymph nodes. Reproductive: Status post hysterectomy. No adnexal masses. Other: No abdominal wall hernia or abnormality. No abdominopelvic ascites. Musculoskeletal: Mild degenerative changes of the lumbar spine are seen. Review of the MIP images confirms the above findings. IMPRESSION: CTA of the chest: No evidence of pulmonary emboli. Multiple acute  rib fractures on the right involving the second, ninth and tenth ribs laterally. Some healing fractures are noted as well. Severe emphysematous changes. 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 No acute abnormality in the abdomen. Electronically Signed   By: Violeta Grey M.D.   On: 01/07/2024 02:54   DG Chest Portable 1 View Result Date:  01/06/2024 CLINICAL DATA:  Right chest pain EXAM: PORTABLE CHEST 1 VIEW COMPARISON:  12/28/2023 FINDINGS: Asymmetric bullous emphysema within the right apex again noted. Interval development of central pulmonary vascular congestion and superimposed trace interstitial pulmonary infiltrate in keeping with shin mild interstitial pulmonary edema, asymmetrically more severe within the right lung base. No pneumothorax or pleural effusion. Cardiac size within normal limits. No acute bone abnormality. IMPRESSION: 1. Interval development of mild interstitial pulmonary edema, asymmetrically more severe within the right lung base. 2. Bullous emphysema, asymmetrically more severe within the right apex Electronically Signed   By: Worthy Heads M.D.   On: 01/06/2024 22:25   CT CHEST ABDOMEN PELVIS WO CONTRAST Result Date: 12/29/2023 CLINICAL DATA:  Blunt poly trauma, mechanical fall week ago with increased pain in the right ribs EXAM: CT CHEST, ABDOMEN AND PELVIS WITHOUT CONTRAST TECHNIQUE: Multidetector CT imaging of the chest, abdomen and pelvis was performed following the standard protocol without IV contrast. RADIATION DOSE REDUCTION: This exam was performed according to the departmental dose-optimization program which includes automated exposure control, adjustment of the mA and/or kV according to patient size and/or use of iterative reconstruction technique. COMPARISON:  11/23/2023 FINDINGS: CT CHEST FINDINGS Cardiovascular: Normal heart size. No pericardial effusion. Severe atheromatous plaque along the aorta, great vessels, and coronary arteries. Mediastinum/Nodes: Nodule at the anterior mediastinum measuring 28 by 12 mm, no generalized adenopathy. This is stable from most recent prior and not seen in 2023. Lungs/Pleura: No hemothorax, pneumothorax, or pulmonary contusion. Severe emphysema with asymmetric air trapping in the right upper lobe. 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 Bands of scarring in the right upper and middle lobes. No new or growing nodule, there will be chest CT follow-up for anterior mediastinal finding. Musculoskeletal: Compression fractures of T4, T5, T6, and T7 which are chronic and unchanged. Nonacute right second and fourth rib fractures with healing. Acute fracture seen posterolateral at the right tenth rib. CT ABDOMEN PELVIS FINDINGS Hepatobiliary: No hepatic injury or perihepatic hematoma. Gallbladder is absent. Chronic 11 mm low-density lesion in the upper liver, size stable since 2023 when this had a more cystic appearance. Pancreas: Negative Spleen: No splenic injury or perisplenic hematoma. Adrenals/Urinary Tract: No adrenal hemorrhage or renal injury identified. Bladder is unremarkable. 4.7 cm left upper pole cyst. Stomach/Bowel: No evidence of injury or incidental inflammation. Vascular/Lymphatic: Severe atheromatous calcification of the aorta and branch vessels with bilateral iliac system narrowing. Reproductive: Hysterectomy Other: No ascites or pneumoperitoneum Musculoskeletal: Generalized osteopenia.  No acute finding IMPRESSION: 1. Acute and mildly displaced right tenth rib fracture. No pneumothorax or hemothorax. 2. Healing right second and fourth rib fractures. Remote midthoracic compression fractures. 3. 2.8 x 1.2 cm nodule in the anterior mediastinum not seen in 2023. This could relate to the thymus or enlarged node, recommend short follow-up CT. 4. Severe emphysema and atherosclerosis. Electronically Signed   By: Ronnette Coke M.D.   On: 12/29/2023 05:27   DG Chest 2 View Result Date: 12/28/2023 CLINICAL DATA:  Status post fall with right rib pain.  Weakness. EXAM: CHEST -  2 VIEW COMPARISON:  Radiograph and CT 11/23/2023 FINDINGS: Advanced emphysema. Stable heart size and mediastinal contours. No pneumothorax or large pleural effusion. No evidence of displaced rib fracture. No focal airspace disease.  Chronic midthoracic compression deformities. IMPRESSION: Advanced emphysema. No acute radiographic findings. Electronically Signed   By: Chadwick Colonel M.D.   On: 12/28/2023 22:55    Microbiology: Results for orders placed or performed during the hospital encounter of 01/06/24  Resp panel by RT-PCR (RSV, Flu A&B, Covid) Anterior Nasal Swab     Status: None   Collection Time: 01/06/24 10:12 PM   Specimen: Anterior Nasal Swab  Result Value Ref Range Status   SARS Coronavirus 2 by RT PCR NEGATIVE NEGATIVE Final    Comment: (NOTE) SARS-CoV-2 target nucleic acids are NOT DETECTED.  The SARS-CoV-2 RNA is generally detectable in upper respiratory specimens during the acute phase of infection. The lowest concentration of SARS-CoV-2 viral copies this assay can detect is 138 copies/mL. A negative result does not preclude SARS-Cov-2 infection and should not be used as the sole basis for treatment or other patient management decisions. A negative result may occur with  improper specimen collection/handling, submission of specimen other than nasopharyngeal swab, presence of viral mutation(s) within the areas targeted by this assay, and inadequate number of viral copies(<138 copies/mL). A negative result must be combined with clinical observations, patient history, and epidemiological information. The expected result is Negative.  Fact Sheet for Patients:  BloggerCourse.com  Fact Sheet for Healthcare Providers:  SeriousBroker.it  This test is no t yet approved or cleared by the [place]  FDA and  has been authorized for detection and/or diagnosis of SARS-CoV-2 by FDA under an Emergency Use Authorization (EUA). This EUA will remain  in effect (meaning this test can be used) for the duration of the COVID-19 declaration under Section 564(b)(1) of the Act, 21 U.S.C.section 360bbb-3(b)(1), unless the authorization is terminated  or revoked  sooner.       Influenza A by PCR NEGATIVE NEGATIVE Final   Influenza B by PCR NEGATIVE NEGATIVE Final    Comment: (NOTE) The Xpert Xpress SARS-CoV-2/FLU/RSV plus assay is intended as an aid in the diagnosis of influenza from Nasopharyngeal swab specimens and should not be used as a sole basis for treatment. Nasal washings and aspirates are unacceptable for Xpert Xpress SARS-CoV-2/FLU/RSV testing.  Fact Sheet for Patients: BloggerCourse.com  Fact Sheet for Healthcare Providers: SeriousBroker.it  This test is not yet approved or cleared by the [place]  FDA and has been authorized for detection and/or diagnosis of SARS-CoV-2 by FDA under an Emergency Use Authorization (EUA). This EUA will remain in effect (meaning this test can be used) for the duration of the COVID-19 declaration under Section 564(b)(1) of the Act, 21 U.S.C. section 360bbb-3(b)(1), unless the authorization is terminated or revoked.     Resp Syncytial Virus by PCR NEGATIVE NEGATIVE Final    Comment: (NOTE) Fact Sheet for Patients: BloggerCourse.com  Fact Sheet for Healthcare Providers: SeriousBroker.it  This test is not yet approved or cleared by the [place]  FDA and has been authorized for detection and/or diagnosis of SARS-CoV-2 by FDA under an Emergency Use Authorization (EUA). This EUA will remain in effect (meaning this test can be used) for the duration of the COVID-19 declaration under Section 564(b)(1) of the Act, 21 U.S.C. section 360bbb-3(b)(1), unless the authorization is terminated or revoked.  Performed at [facility], [address], [place], [place] [address]   Blood culture (routine x 2)  Status: Abnormal   Collection Time: 01/06/24 10:20 PM   Specimen: BLOOD  Result Value Ref Range Status   Specimen Description   Final    BLOOD BLOOD LEFT ARM Performed at [facility],  [address], [place], [place] [address]    Special Requests   Final    BOTTLES DRAWN AEROBIC AND ANAEROBIC Blood Culture adequate volume Performed at [facility], [address] [place], [place] [address]    Culture  Setup Time   Final    GRAM POSITIVE COCCI ANAEROBIC BOTTLE ONLY CRITICAL RESULT CALLED TO, READ BACK BY AND VERIFIED WITH: ANGIE R. ON 01/07/2024 @10 :41PM BY T.HAMER CRITICAL RESULT CALLED TO, READ BACK BY AND VERIFIED WITH: RN TERESA FRENCH [id number] BY J RAZZAK, MT    Culture (A)  Final    STAPHYLOCOCCUS EPIDERMIDIS THE SIGNIFICANCE OF ISOLATING THIS ORGANISM FROM A SINGLE SET OF BLOOD CULTURES WHEN MULTIPLE SETS ARE DRAWN IS UNCERTAIN. PLEASE NOTIFY THE MICROBIOLOGY DEPARTMENT WITHIN ONE WEEK IF SPECIATION AND SENSITIVITIES ARE REQUIRED. Performed at [facility], [address] [place], [place] [address]    Report Status 01/10/2024 FINAL  Final  Blood culture (routine x 2)     Status: None   Collection Time: 01/06/24 10:20 PM   Specimen: BLOOD  Result Value Ref Range Status   Specimen Description BLOOD BLOOD RIGHT HAND  Final   Special Requests   Final    BOTTLES DRAWN AEROBIC AND ANAEROBIC Blood Culture adequate volume   Culture   Final    NO GROWTH 5 DAYS Performed at [facility], [address] [place], [place] [address]    Report Status 01/11/2024 FINAL  Final  Blood Culture ID Panel (Reflexed)     Status: Abnormal   Collection Time: 01/06/24 10:20 PM  Result Value Ref Range Status   Enterococcus faecalis NOT DETECTED NOT DETECTED Final   Enterococcus Faecium NOT DETECTED NOT DETECTED Final   Listeria monocytogenes NOT DETECTED NOT DETECTED Final   Staphylococcus species DETECTED (A) NOT DETECTED Final    Comment: CRITICAL RESULT CALLED TO, READ BACK BY AND VERIFIED WITH: RN TERESA FRENCH [id number] 1306 BY J RAZZAK MT    Staphylococcus aureus (BCID) NOT DETECTED NOT DETECTED Final   Staphylococcus epidermidis DETECTED (A) NOT DETECTED Final     Comment: CRITICAL RESULT CALLED TO, READ BACK BY AND VERIFIED WITH: RN TERESA FRENCH [id number] 1306 BY J RAZZAK MT    Staphylococcus lugdunensis NOT DETECTED NOT DETECTED Final   Streptococcus species NOT DETECTED NOT DETECTED Final   Streptococcus agalactiae NOT DETECTED NOT DETECTED Final   Streptococcus pneumoniae NOT DETECTED NOT DETECTED Final   Streptococcus pyogenes NOT DETECTED NOT DETECTED Final   A.calcoaceticus-baumannii NOT DETECTED NOT DETECTED Final   Bacteroides fragilis NOT DETECTED NOT DETECTED Final   Enterobacterales NOT DETECTED NOT DETECTED Final   Enterobacter cloacae complex NOT DETECTED NOT DETECTED Final   Escherichia coli NOT DETECTED NOT DETECTED Final   Klebsiella aerogenes NOT DETECTED NOT DETECTED Final   Klebsiella oxytoca NOT DETECTED NOT DETECTED Final   Klebsiella pneumoniae NOT DETECTED NOT DETECTED Final   Proteus species NOT DETECTED NOT DETECTED Final   Salmonella species NOT DETECTED NOT DETECTED Final   Serratia marcescens NOT DETECTED NOT DETECTED Final   Haemophilus influenzae NOT DETECTED NOT DETECTED Final   Neisseria meningitidis NOT DETECTED NOT DETECTED Final   Pseudomonas aeruginosa NOT DETECTED NOT DETECTED Final   Stenotrophomonas maltophilia NOT DETECTED NOT DETECTED Final  Candida albicans NOT DETECTED NOT DETECTED Final   Candida auris NOT DETECTED NOT DETECTED Final   Candida glabrata NOT DETECTED NOT DETECTED Final   Candida krusei NOT DETECTED NOT DETECTED Final   Candida parapsilosis NOT DETECTED NOT DETECTED Final   Candida tropicalis NOT DETECTED NOT DETECTED Final   Cryptococcus neoformans/gattii NOT DETECTED NOT DETECTED Final   Methicillin resistance mecA/C NOT DETECTED NOT DETECTED Final    Comment: Performed at [facility], [address] [place], [place] [address]  MRSA Next Gen by PCR, Nasal     Status: Abnormal   Collection Time: 01/09/24  1:12 AM   Specimen: Nasal Mucosa; Nasal Swab  Result Value Ref Range  Status   MRSA by PCR Next Gen DETECTED (A) NOT DETECTED Final    Comment: RESULT CALLED TO, READ BACK BY AND VERIFIED WITH: L HYLTON AT 1516 ON [id number] BY S DALTON (NOTE) The GeneXpert MRSA Assay (FDA approved for NASAL specimens only), is one component of a comprehensive MRSA colonization surveillance program. It is not intended to diagnose MRSA infection nor to guide or monitor treatment for MRSA infections. Test performance is not FDA approved in patients less than 62 years old. Performed at [facility], [address], [place], [place] [address]     Labs: CBC: Recent Labs  Lab 01/08/24 [phone] 01/08/24 0944 01/08/24 2033 01/10/24 0453 01/12/24 0507 01/13/24 0520  WBC 8.3  --   --  11.5* 13.3* 10.8*  HGB 6.3* 7.1* 9.6* 8.9* 8.8* 8.9*  HCT 20.6* 23.2* 30.3* 27.5* 29.0* 29.0*  MCV 103.0*  --   --  100.0 103.6* 101.8*  PLT 311  --   --  323 385 380   Basic Metabolic Panel: Recent Labs  Lab 01/07/24 0812 01/07/24 0915 01/08/24 0456 01/09/24 0522 01/10/24 0453 01/11/24 0430 01/12/24 0507 01/13/24 0520  NA  --   --    < > 138 138  139 139 141 141  K  --   --    < > 3.8 3.5  3.5 4.5 3.9 4.6  CL  --   --    < > 102 105  106 106 109 109  CO2  --   --    < > 24 22  23 25 24 23   GLUCOSE  --   --    < > 140* 169*  171* 184* 124* 91  BUN  --   --    < > 7* 7*  7* 11 12 9   CREATININE  --   --    < > 0.84 1.16*  1.10* 1.33* 0.96 0.76  CALCIUM   --   --    < > 7.5* 7.4*  7.5* 7.5* 7.6* 7.6*  MG 0.7* 0.8*  --   --   --   --   --   --   PHOS 1.7* 1.7*  --   --  3.0  --  1.9* 2.3*   < > = values in this interval not displayed.   Liver Function Tests: Recent Labs  Lab 01/08/24 0456 01/09/24 0522 01/10/24 0453 01/12/24 0507 01/13/24 0520  AST 38 46* 31  --  20  ALT 21 28 22   --  18  ALKPHOS 101 129* 121  --  104  BILITOT 0.8 0.8 0.8  --  0.7  PROT 5.5* 6.0* 5.8*  --  5.7*  ALBUMIN 1.9* 2.2* 2.1*  2.1* 2.1* 2.2*   CBG: Recent Labs  Lab 01/12/24  1642  01/13/24 0007 01/13/24 0743 01/13/24 1121 01/13/24 1718  GLUCAP 120* 119* 99 148* 131*    Discharge time spent: greater than 30 minutes.  Signed: Wynetta Heckle, MD [facility] 01/14/2024

## 2024-01-14 NOTE — Plan of Care (Signed)
  Problem: Clinical Measurements: Goal: Ability to maintain clinical measurements within normal limits will improve Outcome: Progressing Goal: Will remain free from infection Outcome: Progressing Goal: Respiratory complications will improve Outcome: Progressing   Problem: Clinical Measurements: Goal: Will remain free from infection Outcome: Progressing   Problem: Clinical Measurements: Goal: Respiratory complications will improve Outcome: Progressing

## 2024-01-16 DIAGNOSIS — Z87891 Personal history of nicotine dependence: Secondary | ICD-10-CM | POA: Diagnosis not present

## 2024-01-16 DIAGNOSIS — E860 Dehydration: Secondary | ICD-10-CM | POA: Diagnosis not present

## 2024-01-16 DIAGNOSIS — I5022 Chronic systolic (congestive) heart failure: Secondary | ICD-10-CM | POA: Diagnosis not present

## 2024-01-16 DIAGNOSIS — I130 Hypertensive heart and chronic kidney disease with heart failure and stage 1 through stage 4 chronic kidney disease, or unspecified chronic kidney disease: Secondary | ICD-10-CM | POA: Diagnosis not present

## 2024-01-16 DIAGNOSIS — H5461 Unqualified visual loss, right eye, normal vision left eye: Secondary | ICD-10-CM | POA: Diagnosis not present

## 2024-01-16 DIAGNOSIS — Z955 Presence of coronary angioplasty implant and graft: Secondary | ICD-10-CM | POA: Diagnosis not present

## 2024-01-16 DIAGNOSIS — J439 Emphysema, unspecified: Secondary | ICD-10-CM | POA: Diagnosis not present

## 2024-01-16 DIAGNOSIS — D72829 Elevated white blood cell count, unspecified: Secondary | ICD-10-CM | POA: Diagnosis not present

## 2024-01-16 DIAGNOSIS — Z7982 Long term (current) use of aspirin: Secondary | ICD-10-CM | POA: Diagnosis not present

## 2024-01-16 DIAGNOSIS — E785 Hyperlipidemia, unspecified: Secondary | ICD-10-CM | POA: Diagnosis not present

## 2024-01-16 DIAGNOSIS — K219 Gastro-esophageal reflux disease without esophagitis: Secondary | ICD-10-CM | POA: Diagnosis not present

## 2024-01-16 DIAGNOSIS — I7090 Unspecified atherosclerosis: Secondary | ICD-10-CM | POA: Diagnosis not present

## 2024-01-16 DIAGNOSIS — K529 Noninfective gastroenteritis and colitis, unspecified: Secondary | ICD-10-CM | POA: Diagnosis not present

## 2024-01-16 DIAGNOSIS — Z7902 Long term (current) use of antithrombotics/antiplatelets: Secondary | ICD-10-CM | POA: Diagnosis not present

## 2024-01-16 DIAGNOSIS — E872 Acidosis, unspecified: Secondary | ICD-10-CM | POA: Diagnosis not present

## 2024-01-16 DIAGNOSIS — I252 Old myocardial infarction: Secondary | ICD-10-CM | POA: Diagnosis not present

## 2024-01-16 DIAGNOSIS — S2241XD Multiple fractures of ribs, right side, subsequent encounter for fracture with routine healing: Secondary | ICD-10-CM | POA: Diagnosis not present

## 2024-01-16 DIAGNOSIS — I951 Orthostatic hypotension: Secondary | ICD-10-CM | POA: Diagnosis not present

## 2024-01-16 DIAGNOSIS — I2510 Atherosclerotic heart disease of native coronary artery without angina pectoris: Secondary | ICD-10-CM | POA: Diagnosis not present

## 2024-01-16 DIAGNOSIS — N1831 Chronic kidney disease, stage 3a: Secondary | ICD-10-CM | POA: Diagnosis not present

## 2024-01-16 DIAGNOSIS — J9621 Acute and chronic respiratory failure with hypoxia: Secondary | ICD-10-CM | POA: Diagnosis not present

## 2024-01-16 DIAGNOSIS — E876 Hypokalemia: Secondary | ICD-10-CM | POA: Diagnosis not present

## 2024-01-17 DIAGNOSIS — I5022 Chronic systolic (congestive) heart failure: Secondary | ICD-10-CM | POA: Diagnosis not present

## 2024-01-17 DIAGNOSIS — I252 Old myocardial infarction: Secondary | ICD-10-CM | POA: Diagnosis not present

## 2024-01-17 DIAGNOSIS — S2241XD Multiple fractures of ribs, right side, subsequent encounter for fracture with routine healing: Secondary | ICD-10-CM | POA: Diagnosis not present

## 2024-01-17 DIAGNOSIS — K529 Noninfective gastroenteritis and colitis, unspecified: Secondary | ICD-10-CM | POA: Diagnosis not present

## 2024-01-17 DIAGNOSIS — E876 Hypokalemia: Secondary | ICD-10-CM | POA: Diagnosis not present

## 2024-01-17 DIAGNOSIS — H5461 Unqualified visual loss, right eye, normal vision left eye: Secondary | ICD-10-CM | POA: Diagnosis not present

## 2024-01-17 DIAGNOSIS — E785 Hyperlipidemia, unspecified: Secondary | ICD-10-CM | POA: Diagnosis not present

## 2024-01-17 DIAGNOSIS — I951 Orthostatic hypotension: Secondary | ICD-10-CM | POA: Diagnosis not present

## 2024-01-17 DIAGNOSIS — J439 Emphysema, unspecified: Secondary | ICD-10-CM | POA: Diagnosis not present

## 2024-01-17 DIAGNOSIS — Z7902 Long term (current) use of antithrombotics/antiplatelets: Secondary | ICD-10-CM | POA: Diagnosis not present

## 2024-01-17 DIAGNOSIS — N1831 Chronic kidney disease, stage 3a: Secondary | ICD-10-CM | POA: Diagnosis not present

## 2024-01-17 DIAGNOSIS — E860 Dehydration: Secondary | ICD-10-CM | POA: Diagnosis not present

## 2024-01-17 DIAGNOSIS — D72829 Elevated white blood cell count, unspecified: Secondary | ICD-10-CM | POA: Diagnosis not present

## 2024-01-17 DIAGNOSIS — E872 Acidosis, unspecified: Secondary | ICD-10-CM | POA: Diagnosis not present

## 2024-01-17 DIAGNOSIS — Z87891 Personal history of nicotine dependence: Secondary | ICD-10-CM | POA: Diagnosis not present

## 2024-01-17 DIAGNOSIS — I2510 Atherosclerotic heart disease of native coronary artery without angina pectoris: Secondary | ICD-10-CM | POA: Diagnosis not present

## 2024-01-17 DIAGNOSIS — Z7982 Long term (current) use of aspirin: Secondary | ICD-10-CM | POA: Diagnosis not present

## 2024-01-17 DIAGNOSIS — J9621 Acute and chronic respiratory failure with hypoxia: Secondary | ICD-10-CM | POA: Diagnosis not present

## 2024-01-17 DIAGNOSIS — I7090 Unspecified atherosclerosis: Secondary | ICD-10-CM | POA: Diagnosis not present

## 2024-01-17 DIAGNOSIS — Z955 Presence of coronary angioplasty implant and graft: Secondary | ICD-10-CM | POA: Diagnosis not present

## 2024-01-17 DIAGNOSIS — K219 Gastro-esophageal reflux disease without esophagitis: Secondary | ICD-10-CM | POA: Diagnosis not present

## 2024-01-17 DIAGNOSIS — I130 Hypertensive heart and chronic kidney disease with heart failure and stage 1 through stage 4 chronic kidney disease, or unspecified chronic kidney disease: Secondary | ICD-10-CM | POA: Diagnosis not present

## 2024-01-17 NOTE — Transitions of Care (Post Inpatient/ED Visit) (Signed)
 01/17/2024  Patient ID: [name], female   DOB: 1950/11/04, 73 y.o.   MRN: [id number]  Medication Review for transitions of care.  See [employer] for documentation.  Ashlye Oviedo J. April Jeanbaptiste RN, MSN [facility], [facility] RN Care Manager Direct Dial: [phone]  Fax: [phone] Website: [email or web address]

## 2024-01-18 DIAGNOSIS — Z955 Presence of coronary angioplasty implant and graft: Secondary | ICD-10-CM | POA: Diagnosis not present

## 2024-01-18 DIAGNOSIS — Z7982 Long term (current) use of aspirin: Secondary | ICD-10-CM | POA: Diagnosis not present

## 2024-01-18 DIAGNOSIS — K219 Gastro-esophageal reflux disease without esophagitis: Secondary | ICD-10-CM | POA: Diagnosis not present

## 2024-01-18 DIAGNOSIS — I130 Hypertensive heart and chronic kidney disease with heart failure and stage 1 through stage 4 chronic kidney disease, or unspecified chronic kidney disease: Secondary | ICD-10-CM | POA: Diagnosis not present

## 2024-01-18 DIAGNOSIS — E860 Dehydration: Secondary | ICD-10-CM | POA: Diagnosis not present

## 2024-01-18 DIAGNOSIS — I252 Old myocardial infarction: Secondary | ICD-10-CM | POA: Diagnosis not present

## 2024-01-18 DIAGNOSIS — J9621 Acute and chronic respiratory failure with hypoxia: Secondary | ICD-10-CM | POA: Diagnosis not present

## 2024-01-18 DIAGNOSIS — I7090 Unspecified atherosclerosis: Secondary | ICD-10-CM | POA: Diagnosis not present

## 2024-01-18 DIAGNOSIS — N1831 Chronic kidney disease, stage 3a: Secondary | ICD-10-CM | POA: Diagnosis not present

## 2024-01-18 DIAGNOSIS — Z87891 Personal history of nicotine dependence: Secondary | ICD-10-CM | POA: Diagnosis not present

## 2024-01-18 DIAGNOSIS — I5022 Chronic systolic (congestive) heart failure: Secondary | ICD-10-CM | POA: Diagnosis not present

## 2024-01-18 DIAGNOSIS — E876 Hypokalemia: Secondary | ICD-10-CM | POA: Diagnosis not present

## 2024-01-18 DIAGNOSIS — E785 Hyperlipidemia, unspecified: Secondary | ICD-10-CM | POA: Diagnosis not present

## 2024-01-18 DIAGNOSIS — K529 Noninfective gastroenteritis and colitis, unspecified: Secondary | ICD-10-CM | POA: Diagnosis not present

## 2024-01-18 DIAGNOSIS — J439 Emphysema, unspecified: Secondary | ICD-10-CM | POA: Diagnosis not present

## 2024-01-18 DIAGNOSIS — I2510 Atherosclerotic heart disease of native coronary artery without angina pectoris: Secondary | ICD-10-CM | POA: Diagnosis not present

## 2024-01-18 DIAGNOSIS — E872 Acidosis, unspecified: Secondary | ICD-10-CM | POA: Diagnosis not present

## 2024-01-18 DIAGNOSIS — D72829 Elevated white blood cell count, unspecified: Secondary | ICD-10-CM | POA: Diagnosis not present

## 2024-01-18 DIAGNOSIS — I951 Orthostatic hypotension: Secondary | ICD-10-CM | POA: Diagnosis not present

## 2024-01-18 DIAGNOSIS — Z7902 Long term (current) use of antithrombotics/antiplatelets: Secondary | ICD-10-CM | POA: Diagnosis not present

## 2024-01-18 DIAGNOSIS — H5461 Unqualified visual loss, right eye, normal vision left eye: Secondary | ICD-10-CM | POA: Diagnosis not present

## 2024-01-18 DIAGNOSIS — S2241XD Multiple fractures of ribs, right side, subsequent encounter for fracture with routine healing: Secondary | ICD-10-CM | POA: Diagnosis not present

## 2024-01-18 NOTE — Telephone Encounter (Signed)
 Spoke to [facility] and they are going to resend order to our office.

## 2024-01-20 DIAGNOSIS — I7090 Unspecified atherosclerosis: Secondary | ICD-10-CM | POA: Diagnosis not present

## 2024-01-20 DIAGNOSIS — J9621 Acute and chronic respiratory failure with hypoxia: Secondary | ICD-10-CM | POA: Diagnosis not present

## 2024-01-20 DIAGNOSIS — E872 Acidosis, unspecified: Secondary | ICD-10-CM | POA: Diagnosis not present

## 2024-01-20 DIAGNOSIS — Z7902 Long term (current) use of antithrombotics/antiplatelets: Secondary | ICD-10-CM | POA: Diagnosis not present

## 2024-01-20 DIAGNOSIS — E860 Dehydration: Secondary | ICD-10-CM | POA: Diagnosis not present

## 2024-01-20 DIAGNOSIS — H5461 Unqualified visual loss, right eye, normal vision left eye: Secondary | ICD-10-CM | POA: Diagnosis not present

## 2024-01-20 DIAGNOSIS — D72829 Elevated white blood cell count, unspecified: Secondary | ICD-10-CM | POA: Diagnosis not present

## 2024-01-20 DIAGNOSIS — E876 Hypokalemia: Secondary | ICD-10-CM | POA: Diagnosis not present

## 2024-01-20 DIAGNOSIS — S2241XD Multiple fractures of ribs, right side, subsequent encounter for fracture with routine healing: Secondary | ICD-10-CM | POA: Diagnosis not present

## 2024-01-20 DIAGNOSIS — I951 Orthostatic hypotension: Secondary | ICD-10-CM | POA: Diagnosis not present

## 2024-01-20 DIAGNOSIS — N1831 Chronic kidney disease, stage 3a: Secondary | ICD-10-CM | POA: Diagnosis not present

## 2024-01-20 DIAGNOSIS — Z955 Presence of coronary angioplasty implant and graft: Secondary | ICD-10-CM | POA: Diagnosis not present

## 2024-01-20 DIAGNOSIS — I252 Old myocardial infarction: Secondary | ICD-10-CM | POA: Diagnosis not present

## 2024-01-20 DIAGNOSIS — E785 Hyperlipidemia, unspecified: Secondary | ICD-10-CM | POA: Diagnosis not present

## 2024-01-20 DIAGNOSIS — Z87891 Personal history of nicotine dependence: Secondary | ICD-10-CM | POA: Diagnosis not present

## 2024-01-20 DIAGNOSIS — I2510 Atherosclerotic heart disease of native coronary artery without angina pectoris: Secondary | ICD-10-CM | POA: Diagnosis not present

## 2024-01-20 DIAGNOSIS — K219 Gastro-esophageal reflux disease without esophagitis: Secondary | ICD-10-CM | POA: Diagnosis not present

## 2024-01-20 DIAGNOSIS — K529 Noninfective gastroenteritis and colitis, unspecified: Secondary | ICD-10-CM | POA: Diagnosis not present

## 2024-01-20 DIAGNOSIS — I5022 Chronic systolic (congestive) heart failure: Secondary | ICD-10-CM | POA: Diagnosis not present

## 2024-01-20 DIAGNOSIS — I130 Hypertensive heart and chronic kidney disease with heart failure and stage 1 through stage 4 chronic kidney disease, or unspecified chronic kidney disease: Secondary | ICD-10-CM | POA: Diagnosis not present

## 2024-01-20 DIAGNOSIS — J439 Emphysema, unspecified: Secondary | ICD-10-CM | POA: Diagnosis not present

## 2024-01-20 DIAGNOSIS — Z7982 Long term (current) use of aspirin: Secondary | ICD-10-CM | POA: Diagnosis not present

## 2024-01-24 ENCOUNTER — Telehealth: Payer: Self-pay

## 2024-01-24 DIAGNOSIS — E872 Acidosis, unspecified: Secondary | ICD-10-CM | POA: Diagnosis not present

## 2024-01-24 DIAGNOSIS — I5022 Chronic systolic (congestive) heart failure: Secondary | ICD-10-CM | POA: Diagnosis not present

## 2024-01-24 DIAGNOSIS — S2241XD Multiple fractures of ribs, right side, subsequent encounter for fracture with routine healing: Secondary | ICD-10-CM | POA: Diagnosis not present

## 2024-01-24 DIAGNOSIS — K529 Noninfective gastroenteritis and colitis, unspecified: Secondary | ICD-10-CM | POA: Diagnosis not present

## 2024-01-24 DIAGNOSIS — I130 Hypertensive heart and chronic kidney disease with heart failure and stage 1 through stage 4 chronic kidney disease, or unspecified chronic kidney disease: Secondary | ICD-10-CM | POA: Diagnosis not present

## 2024-01-24 DIAGNOSIS — Z7982 Long term (current) use of aspirin: Secondary | ICD-10-CM | POA: Diagnosis not present

## 2024-01-24 DIAGNOSIS — E876 Hypokalemia: Secondary | ICD-10-CM | POA: Diagnosis not present

## 2024-01-24 DIAGNOSIS — I2510 Atherosclerotic heart disease of native coronary artery without angina pectoris: Secondary | ICD-10-CM | POA: Diagnosis not present

## 2024-01-24 DIAGNOSIS — J9621 Acute and chronic respiratory failure with hypoxia: Secondary | ICD-10-CM | POA: Diagnosis not present

## 2024-01-24 DIAGNOSIS — K219 Gastro-esophageal reflux disease without esophagitis: Secondary | ICD-10-CM | POA: Diagnosis not present

## 2024-01-24 DIAGNOSIS — E860 Dehydration: Secondary | ICD-10-CM | POA: Diagnosis not present

## 2024-01-24 DIAGNOSIS — Z87891 Personal history of nicotine dependence: Secondary | ICD-10-CM | POA: Diagnosis not present

## 2024-01-24 DIAGNOSIS — N1831 Chronic kidney disease, stage 3a: Secondary | ICD-10-CM | POA: Diagnosis not present

## 2024-01-24 DIAGNOSIS — Z7902 Long term (current) use of antithrombotics/antiplatelets: Secondary | ICD-10-CM | POA: Diagnosis not present

## 2024-01-24 DIAGNOSIS — I7090 Unspecified atherosclerosis: Secondary | ICD-10-CM | POA: Diagnosis not present

## 2024-01-24 DIAGNOSIS — D72829 Elevated white blood cell count, unspecified: Secondary | ICD-10-CM | POA: Diagnosis not present

## 2024-01-24 DIAGNOSIS — J439 Emphysema, unspecified: Secondary | ICD-10-CM | POA: Diagnosis not present

## 2024-01-24 DIAGNOSIS — I951 Orthostatic hypotension: Secondary | ICD-10-CM | POA: Diagnosis not present

## 2024-01-24 DIAGNOSIS — Z955 Presence of coronary angioplasty implant and graft: Secondary | ICD-10-CM | POA: Diagnosis not present

## 2024-01-24 DIAGNOSIS — I252 Old myocardial infarction: Secondary | ICD-10-CM | POA: Diagnosis not present

## 2024-01-24 DIAGNOSIS — H5461 Unqualified visual loss, right eye, normal vision left eye: Secondary | ICD-10-CM | POA: Diagnosis not present

## 2024-01-24 DIAGNOSIS — E785 Hyperlipidemia, unspecified: Secondary | ICD-10-CM | POA: Diagnosis not present

## 2024-01-24 NOTE — Progress Notes (Unsigned)
 [name], female    DOB: [birth year]    MRN: [id number]   Brief patient profile:  1 yobf quit smoking 07/2017 and w/in year got started on inhalers  referred to pulmonary clinic in [facility]  10/01/2022 by Eldon Greenland  for copd/ 02 dep   History of Present Illness  10/01/2022  Pulmonary/ 1st office eval/ [facility] Hailey / [name] out of rehab since Jul 04 2022 on combivent  prn and 02 prn as well  Chief Complaint  Patient presents with   Consult    Pt consult was admitted 9/8-9/27 for hypoxia, she is currently using 2L O2 PRN - she is having increased SOB.   Dyspnea:  cleaning house s 02  Cough: rattling but min mucoid production  Sleep: flat bed 2 pillows  SABA use: combivent  but not using smi correctly  02: 2lpm hs/ sitting 2lpm  most time does not wear it walking  Rec Plan A = Automatic = Always=    Stiolto 2 pffs each am  Work on inhaler technique:  Plan B = Backup (to supplement plan A, not to replace it) Only use your albuterol -ipatropium inhaler as a rescue medication 0xygen 3lpm but ok to increase if needed to keep over 90% walking but remember to turn back down if sats well above 90% at rest.   Referred to cards for ex  cp/ sys chf by last echo > seen 07/13/23 and denied cp, doe and echo EF up to 50% so f/u yearly, no change in Rx     09/01/2023  f/u ov/[place] office/Deverick Pruss re:  COPD/emphysema  Gold stage?  02 dep  maint on stiolto  pfts and ldsct not done as rec  Chief Complaint  Patient presents with   COPD   Shortness of Breath  Dyspnea:  room to room at home off 02 then sits down and uses 02 to recover  Cough: minimal > clear in am  Sleeping: bed is level 2 pillows no  resp cc  SABA use: used twice since last visit, does not know upper  limit for prn use  02: 3lpm hs 24/7 x when walking  Lung cancer screening: not done Rec Pantoprazole  (protonix ) 40 mg   Take  30-60 min before first meal of the day and Pepcid  (famotidine )  20 mg after supper until return to  office  If you continue to have a problem swallowing on the above and off all mint/ menthol and chocolate you will need to check with Dr Eldon Greenland about a referral to GI doctor Only use your albuterol  as a rescue medication  Work on inhaler technique:   Make sure you check your oxygen  saturation  AT  your highest level of activity (not after you stop)   to be sure it stays over 90% My office will be contacting you by phone for referral for pfts  > not done as of 01/25/2024  - if you don't hear back from my office within one week please call us  back or notify us  thru MyChart and we'll address it right away.   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 This is stable from most recent prior and not seen in 2023.  No hemothorax, pneumothorax, or pulmonary contusion. Severe emphysema with asymmetric air trapping in the right upper lobe. 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 Bands of scarring in the right upper and middle lobes. No new or growing nodule, there will be chest CT follow-up for anterior mediastinal finding.    01/25/2024  f/u ov/[place] office/Shanie Mauzy re: copd / emphysema  maint on stiolto  did  bring inhalers  Chief Complaint  Patient presents with   COPD   Shortness of Breath  Dyspnea:  walks with walker improving since fell  Cough: none  Sleeping: flat bed / 2 pillows  s  resp cc  SABA use: rarely /  02: 3lpm 24/7     No obvious day to day or daytime variability or assoc excess/ purulent sputum or mucus plugs or hemoptysis or cp or chest tightness, subjective wheeze or overt sinus or hb symptoms.    Also denies any obvious fluctuation of symptoms with weather or environmental changes or other aggravating or alleviating factors  except as outlined above   No unusual exposure hx or h/o childhood pna/ asthma or knowledge of premature birth.  Current Allergies, Complete Past Medical History, Past Surgical History, Family History, and Social History were reviewed in [employer] record.  ROS  The following are not active complaints unless bolded Hoarseness, sore throat, dysphagia, dental problems, itching, sneezing,  nasal congestion or discharge of excess mucus or purulent secretions, ear ache,   fever, chills, sweats, unintended wt loss or wt gain, classically pleuritic or exertional cp,  orthopnea pnd or arm/hand swelling  or leg swelling, presyncope, palpitations, abdominal pain, anorexia, nausea, vomiting, diarrhea  or change in bowel habits or change in bladder habits, change in stools or change in urine, dysuria, hematuria,  rash, arthralgias, visual complaints, headache, numbness, weakness or ataxia or problems with walking or coordination,  change in mood or  memory.        Current Meds  Medication Sig   acetaminophen  (TYLENOL ) 650 MG CR tablet Take 1,300 mg by mouth every 8 (eight) hours as needed for pain.   aspirin  EC 81 MG tablet Take 81 mg by mouth daily. Swallow whole.   citalopram  (CELEXA ) 20 MG tablet Take 20 mg by mouth daily.   cloNIDine  (CATAPRES ) 0.3 MG tablet Take 0.3 mg by mouth 2 (two) times daily.   clopidogrel  (PLAVIX ) 75 MG tablet Take 1 tablet (75 mg total) by mouth daily.   fluticasone (FLONASE) 50 MCG/ACT nasal spray Place 1 spray into both nostrils daily as needed for rhinitis or allergies.   folic acid  (FOLVITE ) 1 MG tablet Take 1 tablet (1 mg total) by mouth daily.   Ipratropium-Albuterol  (COMBIVENT ) 20-100 MCG/ACT AERS respimat Inhale 1 puff into the lungs every 6 (six) hours as needed for wheezing or shortness of breath.   losartan  (COZAAR ) 25 MG tablet Take 12.5 mg by mouth daily.   metoprolol  succinate (TOPROL  XL) 50 MG 24 hr tablet Take 1 tablet (50 mg total) by  mouth daily. Take with or immediately following a meal.   mirtazapine  (REMERON ) 7.5 MG tablet Take 7.5 mg by mouth at bedtime.   montelukast  (SINGULAIR ) 10 MG tablet Take 10 mg by mouth at bedtime.   ondansetron  (ZOFRAN ) 4 MG tablet Take 1 tablet (4 mg total) by mouth every 8 (eight) hours as needed for nausea or vomiting.   oxyCODONE -acetaminophen  (PERCOCET) 5-325 MG tablet Take 1 tablet by mouth every 4 (four) hours as needed.   pantoprazole  (PROTONIX ) 40 MG tablet Take 40 mg by mouth daily.   STIOLTO  RESPIMAT 2.5-2.5 MCG/ACT AERS Inhale 2 puffs into the lungs daily.   torsemide  (DEMADEX ) 20 MG tablet Take 40 mg by mouth 2 (two) times daily.   traMADol  (ULTRAM ) 50 MG tablet Take 50 mg by mouth 2 (two) times daily as needed for moderate pain (pain score 4-6).   vitamin B-12 (CYANOCOBALAMIN ) 500 MCG tablet Take 1 tablet (500 mcg total) by mouth daily.          Past Medical History:  Diagnosis Date   Anxiety    Blind    right   Colitis 09/07/2011   CVA (cerebral infarction)    GERD (gastroesophageal reflux disease)    Hyperlipidemia    Hypertension    Myocardial infarct (HCC)    1997, 2004   Severe major depression with psychotic features (HCC)    2004   Stroke [facility]) 2002       Objective:    Wts  01/25/2024        116  09/01/2023     138   02/22/2023         134    11/16/22 112 lb 12.8 oz (51.2 kg)  10/01/22 108 lb 9.6 oz (49.3 kg)  06/15/22 111 lb 4.3 oz (50.5 kg)    Vital signs reviewed  01/25/2024  - Note at rest 02 sats  99% on 3lpm    General appearance:    w/c bound elderly bf nad   HEENT : Oropharynx clear/ top dentures/  Nasal turbinates nl    NECK :  without  apparent JVD/ palpable Nodes/TM    LUNGS: no acc muscle use,  Mild barrel  contour chest wall with bilateral  Distant bs s audible wheeze and  without cough on insp or exp maneuvers  and mild  Hyperresonant  to  percussion bilaterally     CV:  RRR  no s3 or murmur or increase in P2, and no edema   ABD:   soft and nontender with pos end  insp Hoover's  in the supine position.  No bruits or organomegaly appreciated   MS:  ext warm without deformities Or obvious joint restrictions  calf tenderness, cyanosis or clubbing     SKIN: warm and dry without lesions    NEURO:  alert, approp, nl sensorium with  no motor or cerebellar deficits apparent.                Assessment

## 2024-01-25 ENCOUNTER — Ambulatory Visit: Admitting: Internal Medicine

## 2024-01-25 ENCOUNTER — Encounter: Payer: Self-pay | Admitting: Internal Medicine

## 2024-01-25 VITALS — BP 88/60 | HR 99 | Ht 62.0 in | Wt 116.0 lb

## 2024-01-25 DIAGNOSIS — J449 Chronic obstructive pulmonary disease, unspecified: Secondary | ICD-10-CM | POA: Diagnosis not present

## 2024-01-25 DIAGNOSIS — D360 Benign neoplasm of lymph nodes: Secondary | ICD-10-CM

## 2024-01-25 DIAGNOSIS — J9611 Chronic respiratory failure with hypoxia: Secondary | ICD-10-CM | POA: Diagnosis not present

## 2024-01-25 MED ORDER — TRELEGY ELLIPTA 100-62.5-25 MCG/ACT IN AEPB: 1.0000 | INHALATION_SPRAY | Freq: Every morning | RESPIRATORY_TRACT | Status: DC

## 2024-01-25 MED ORDER — ANORO ELLIPTA 62.5-25 MCG/ACT IN AEPB: 1.0000 | INHALATION_SPRAY | Freq: Every day | RESPIRATORY_TRACT | 11 refills | Status: DC

## 2024-01-25 NOTE — Assessment & Plan Note (Addendum)
 See CTa chest  12/28/23 needs repeat CT with contrast 06/2024 (will need premed so hold off until 6 m f/u ov)    Likely a benign Lymph node> repeat scan in 6 m as above  Discussed in detail all the  indications, usual  risks and alternatives  relative to the benefits with patient who agrees to proceed with w/u as outlined.       Each maintenance medication was reviewed in detail including emphasizing most importantly the difference between maintenance and prns and under what circumstances the prns are to be triggered using an action plan format where appropriate.  Total time for H and P, chart review, counseling, reviewing hfa/dpi/ 02/ pulse ox device(s) and generating customized AVS unique to this office visit / same day charting = 33 min

## 2024-01-25 NOTE — Patient Instructions (Addendum)
 Anoro (or Trelegy sample)  one click each am and take any drags off it as you need to get it all in then rinse and gargle  -   If mouth or throat bother you at all,  try brushing teeth/gums/tongue with arm and hammer toothpaste/ make a slurry and gargle and spit out.   Combivent  is up to very 4 hours if needed to catch your breath   Please schedule a follow up visit in 6 months but call sooner if needed

## 2024-01-25 NOTE — Assessment & Plan Note (Signed)
 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  - 10/01/2022  After extensive coaching inhaler device,  effectiveness =    60% smi (short ti)  > try stiolto  - 09/01/2023  After extensive coaching inhaler device,  effectiveness =    60% with SMI (short Ti) > continue stiolto and approp saba  - 01/25/2024  After extensive coaching inhaler device,  effectiveness =    75% DPI and prefers  ANORO   Given sample of trelegy to teach DPI (still very short Ti) so elicited son Wilhemina Harden help to teach optimal technique.   Ok to use combivent  as back up for now though the sama is redundant with the [place] on board.

## 2024-01-25 NOTE — Assessment & Plan Note (Signed)
 D/c on 2lpm with HC03  = 31  06/16/22  - 10/01/2022 Patient Saturations on Room Air at Rest = 88%  while Ambulating = 86% / on 3 Liters of oxygen  while Ambulating = 94% - 02/22/2023   Walked on 2.5 cont lpm  x  2  lap(s) =  approx 300  ft  @ slow pace, stopped due to cp/sob  with lowest 02 sats 95%  - 09/01/2023   Walked on 2lpm cont  x  1  lap(s) =  approx 150  ft  @ mod then slowed pace, stopped due to hip pain  with lowest 02 sats 99%  Adequate control on present rx, reviewed in detail with pt > no change in rx needed

## 2024-01-26 ENCOUNTER — Telehealth: Payer: Self-pay | Admitting: Internal Medicine

## 2024-01-26 DIAGNOSIS — I252 Old myocardial infarction: Secondary | ICD-10-CM | POA: Diagnosis not present

## 2024-01-26 DIAGNOSIS — E785 Hyperlipidemia, unspecified: Secondary | ICD-10-CM | POA: Diagnosis not present

## 2024-01-26 DIAGNOSIS — Z955 Presence of coronary angioplasty implant and graft: Secondary | ICD-10-CM | POA: Diagnosis not present

## 2024-01-26 DIAGNOSIS — Z7902 Long term (current) use of antithrombotics/antiplatelets: Secondary | ICD-10-CM | POA: Diagnosis not present

## 2024-01-26 DIAGNOSIS — N1831 Chronic kidney disease, stage 3a: Secondary | ICD-10-CM | POA: Diagnosis not present

## 2024-01-26 DIAGNOSIS — I951 Orthostatic hypotension: Secondary | ICD-10-CM | POA: Diagnosis not present

## 2024-01-26 DIAGNOSIS — E872 Acidosis, unspecified: Secondary | ICD-10-CM | POA: Diagnosis not present

## 2024-01-26 DIAGNOSIS — J439 Emphysema, unspecified: Secondary | ICD-10-CM | POA: Diagnosis not present

## 2024-01-26 DIAGNOSIS — Z87891 Personal history of nicotine dependence: Secondary | ICD-10-CM | POA: Diagnosis not present

## 2024-01-26 DIAGNOSIS — I7090 Unspecified atherosclerosis: Secondary | ICD-10-CM | POA: Diagnosis not present

## 2024-01-26 DIAGNOSIS — I5022 Chronic systolic (congestive) heart failure: Secondary | ICD-10-CM | POA: Diagnosis not present

## 2024-01-26 DIAGNOSIS — S2241XD Multiple fractures of ribs, right side, subsequent encounter for fracture with routine healing: Secondary | ICD-10-CM | POA: Diagnosis not present

## 2024-01-26 DIAGNOSIS — I130 Hypertensive heart and chronic kidney disease with heart failure and stage 1 through stage 4 chronic kidney disease, or unspecified chronic kidney disease: Secondary | ICD-10-CM | POA: Diagnosis not present

## 2024-01-26 DIAGNOSIS — J9621 Acute and chronic respiratory failure with hypoxia: Secondary | ICD-10-CM | POA: Diagnosis not present

## 2024-01-26 DIAGNOSIS — K529 Noninfective gastroenteritis and colitis, unspecified: Secondary | ICD-10-CM | POA: Diagnosis not present

## 2024-01-26 DIAGNOSIS — E876 Hypokalemia: Secondary | ICD-10-CM | POA: Diagnosis not present

## 2024-01-26 DIAGNOSIS — I2510 Atherosclerotic heart disease of native coronary artery without angina pectoris: Secondary | ICD-10-CM | POA: Diagnosis not present

## 2024-01-26 DIAGNOSIS — Z7982 Long term (current) use of aspirin: Secondary | ICD-10-CM | POA: Diagnosis not present

## 2024-01-26 DIAGNOSIS — K219 Gastro-esophageal reflux disease without esophagitis: Secondary | ICD-10-CM | POA: Diagnosis not present

## 2024-01-26 DIAGNOSIS — E860 Dehydration: Secondary | ICD-10-CM | POA: Diagnosis not present

## 2024-01-26 DIAGNOSIS — D72829 Elevated white blood cell count, unspecified: Secondary | ICD-10-CM | POA: Diagnosis not present

## 2024-01-26 DIAGNOSIS — H5461 Unqualified visual loss, right eye, normal vision left eye: Secondary | ICD-10-CM | POA: Diagnosis not present

## 2024-01-26 NOTE — Telephone Encounter (Signed)
 Pt c/o medication issue:  1. Name of Medication:  torsemide  (DEMADEX ) 20 MG tablet   2. How are you currently taking this medication (dosage and times per day)?    3. Are you having a reaction (difficulty breathing--STAT)?   4. What is your medication issue? Please clarify dose. Sarah with [facility] health says son advised that patient has been taking 20 MG twice daily, instead of 40 MG. Sarah read instructions on bottle and she would like to clarify. She says BP in left arm was 88/52 and right arm was 98/58. Please advise.

## 2024-01-27 DIAGNOSIS — N1831 Chronic kidney disease, stage 3a: Secondary | ICD-10-CM | POA: Diagnosis not present

## 2024-01-27 DIAGNOSIS — E872 Acidosis, unspecified: Secondary | ICD-10-CM | POA: Diagnosis not present

## 2024-01-27 DIAGNOSIS — I252 Old myocardial infarction: Secondary | ICD-10-CM | POA: Diagnosis not present

## 2024-01-27 DIAGNOSIS — I5022 Chronic systolic (congestive) heart failure: Secondary | ICD-10-CM | POA: Diagnosis not present

## 2024-01-27 DIAGNOSIS — I130 Hypertensive heart and chronic kidney disease with heart failure and stage 1 through stage 4 chronic kidney disease, or unspecified chronic kidney disease: Secondary | ICD-10-CM | POA: Diagnosis not present

## 2024-01-27 DIAGNOSIS — I951 Orthostatic hypotension: Secondary | ICD-10-CM | POA: Diagnosis not present

## 2024-01-27 DIAGNOSIS — K529 Noninfective gastroenteritis and colitis, unspecified: Secondary | ICD-10-CM | POA: Diagnosis not present

## 2024-01-27 DIAGNOSIS — J9621 Acute and chronic respiratory failure with hypoxia: Secondary | ICD-10-CM | POA: Diagnosis not present

## 2024-01-27 DIAGNOSIS — S2241XD Multiple fractures of ribs, right side, subsequent encounter for fracture with routine healing: Secondary | ICD-10-CM | POA: Diagnosis not present

## 2024-01-27 DIAGNOSIS — E785 Hyperlipidemia, unspecified: Secondary | ICD-10-CM | POA: Diagnosis not present

## 2024-01-27 DIAGNOSIS — E876 Hypokalemia: Secondary | ICD-10-CM | POA: Diagnosis not present

## 2024-01-27 DIAGNOSIS — D72829 Elevated white blood cell count, unspecified: Secondary | ICD-10-CM | POA: Diagnosis not present

## 2024-01-27 DIAGNOSIS — I2510 Atherosclerotic heart disease of native coronary artery without angina pectoris: Secondary | ICD-10-CM | POA: Diagnosis not present

## 2024-01-27 DIAGNOSIS — Z955 Presence of coronary angioplasty implant and graft: Secondary | ICD-10-CM | POA: Diagnosis not present

## 2024-01-27 DIAGNOSIS — Z7982 Long term (current) use of aspirin: Secondary | ICD-10-CM | POA: Diagnosis not present

## 2024-01-27 DIAGNOSIS — J439 Emphysema, unspecified: Secondary | ICD-10-CM | POA: Diagnosis not present

## 2024-01-27 DIAGNOSIS — K219 Gastro-esophageal reflux disease without esophagitis: Secondary | ICD-10-CM | POA: Diagnosis not present

## 2024-01-27 DIAGNOSIS — E860 Dehydration: Secondary | ICD-10-CM | POA: Diagnosis not present

## 2024-01-27 DIAGNOSIS — H5461 Unqualified visual loss, right eye, normal vision left eye: Secondary | ICD-10-CM | POA: Diagnosis not present

## 2024-01-27 DIAGNOSIS — Z7902 Long term (current) use of antithrombotics/antiplatelets: Secondary | ICD-10-CM | POA: Diagnosis not present

## 2024-01-27 DIAGNOSIS — I7090 Unspecified atherosclerosis: Secondary | ICD-10-CM | POA: Diagnosis not present

## 2024-01-27 DIAGNOSIS — Z87891 Personal history of nicotine dependence: Secondary | ICD-10-CM | POA: Diagnosis not present

## 2024-01-27 NOTE — Telephone Encounter (Signed)
 Contacted [facility] to verify torsemide  prescription on file. Spoke with Muhummad who says that patient had 2 torsemide  prescription on file. The most recent was prescribed by Dr. Johnney Nam, torsemide  40 mg twice sent 10/25/2023 & the 2nd prescription was sent by Dr. [name] torsemide  40 mg twice daily sent 08/23/2023.  Called patient but no answer. Unable to leave message due to full mail box.

## 2024-01-31 NOTE — Telephone Encounter (Signed)
 Several attempts made to assist with torsemide  question.  Will await return call.

## 2024-02-01 DIAGNOSIS — S2241XD Multiple fractures of ribs, right side, subsequent encounter for fracture with routine healing: Secondary | ICD-10-CM | POA: Diagnosis not present

## 2024-02-01 DIAGNOSIS — J439 Emphysema, unspecified: Secondary | ICD-10-CM | POA: Diagnosis not present

## 2024-02-01 DIAGNOSIS — J9621 Acute and chronic respiratory failure with hypoxia: Secondary | ICD-10-CM | POA: Diagnosis not present

## 2024-02-01 DIAGNOSIS — I252 Old myocardial infarction: Secondary | ICD-10-CM | POA: Diagnosis not present

## 2024-02-01 DIAGNOSIS — H5461 Unqualified visual loss, right eye, normal vision left eye: Secondary | ICD-10-CM | POA: Diagnosis not present

## 2024-02-01 DIAGNOSIS — E860 Dehydration: Secondary | ICD-10-CM | POA: Diagnosis not present

## 2024-02-01 DIAGNOSIS — D72829 Elevated white blood cell count, unspecified: Secondary | ICD-10-CM | POA: Diagnosis not present

## 2024-02-01 DIAGNOSIS — K529 Noninfective gastroenteritis and colitis, unspecified: Secondary | ICD-10-CM | POA: Diagnosis not present

## 2024-02-01 DIAGNOSIS — Z7902 Long term (current) use of antithrombotics/antiplatelets: Secondary | ICD-10-CM | POA: Diagnosis not present

## 2024-02-01 DIAGNOSIS — K219 Gastro-esophageal reflux disease without esophagitis: Secondary | ICD-10-CM | POA: Diagnosis not present

## 2024-02-01 DIAGNOSIS — Z87891 Personal history of nicotine dependence: Secondary | ICD-10-CM | POA: Diagnosis not present

## 2024-02-01 DIAGNOSIS — E876 Hypokalemia: Secondary | ICD-10-CM | POA: Diagnosis not present

## 2024-02-01 DIAGNOSIS — E785 Hyperlipidemia, unspecified: Secondary | ICD-10-CM | POA: Diagnosis not present

## 2024-02-01 DIAGNOSIS — I5022 Chronic systolic (congestive) heart failure: Secondary | ICD-10-CM | POA: Diagnosis not present

## 2024-02-01 DIAGNOSIS — N1831 Chronic kidney disease, stage 3a: Secondary | ICD-10-CM | POA: Diagnosis not present

## 2024-02-01 DIAGNOSIS — Z7982 Long term (current) use of aspirin: Secondary | ICD-10-CM | POA: Diagnosis not present

## 2024-02-01 DIAGNOSIS — I7090 Unspecified atherosclerosis: Secondary | ICD-10-CM | POA: Diagnosis not present

## 2024-02-01 DIAGNOSIS — I951 Orthostatic hypotension: Secondary | ICD-10-CM | POA: Diagnosis not present

## 2024-02-01 DIAGNOSIS — Z955 Presence of coronary angioplasty implant and graft: Secondary | ICD-10-CM | POA: Diagnosis not present

## 2024-02-01 DIAGNOSIS — I130 Hypertensive heart and chronic kidney disease with heart failure and stage 1 through stage 4 chronic kidney disease, or unspecified chronic kidney disease: Secondary | ICD-10-CM | POA: Diagnosis not present

## 2024-02-01 DIAGNOSIS — I2510 Atherosclerotic heart disease of native coronary artery without angina pectoris: Secondary | ICD-10-CM | POA: Diagnosis not present

## 2024-02-01 DIAGNOSIS — E872 Acidosis, unspecified: Secondary | ICD-10-CM | POA: Diagnosis not present

## 2024-02-02 ENCOUNTER — Encounter (HOSPITAL_COMMUNITY): Payer: Self-pay | Admitting: Gerontology

## 2024-02-02 DIAGNOSIS — I10 Essential (primary) hypertension: Secondary | ICD-10-CM | POA: Diagnosis not present

## 2024-02-02 DIAGNOSIS — J449 Chronic obstructive pulmonary disease, unspecified: Secondary | ICD-10-CM | POA: Diagnosis not present

## 2024-02-02 DIAGNOSIS — Z1389 Encounter for screening for other disorder: Secondary | ICD-10-CM | POA: Diagnosis not present

## 2024-02-02 DIAGNOSIS — M1990 Unspecified osteoarthritis, unspecified site: Secondary | ICD-10-CM | POA: Diagnosis not present

## 2024-02-02 DIAGNOSIS — Z0001 Encounter for general adult medical examination with abnormal findings: Secondary | ICD-10-CM | POA: Diagnosis not present

## 2024-02-02 DIAGNOSIS — J9610 Chronic respiratory failure, unspecified whether with hypoxia or hypercapnia: Secondary | ICD-10-CM | POA: Diagnosis not present

## 2024-02-02 DIAGNOSIS — I5022 Chronic systolic (congestive) heart failure: Secondary | ICD-10-CM | POA: Diagnosis not present

## 2024-02-02 DIAGNOSIS — Z23 Encounter for immunization: Secondary | ICD-10-CM | POA: Diagnosis not present

## 2024-02-02 DIAGNOSIS — Z9981 Dependence on supplemental oxygen: Secondary | ICD-10-CM | POA: Diagnosis not present

## 2024-02-02 DIAGNOSIS — I2510 Atherosclerotic heart disease of native coronary artery without angina pectoris: Secondary | ICD-10-CM | POA: Diagnosis not present

## 2024-02-03 DIAGNOSIS — K529 Noninfective gastroenteritis and colitis, unspecified: Secondary | ICD-10-CM | POA: Diagnosis not present

## 2024-02-03 DIAGNOSIS — J439 Emphysema, unspecified: Secondary | ICD-10-CM | POA: Diagnosis not present

## 2024-02-03 DIAGNOSIS — H5461 Unqualified visual loss, right eye, normal vision left eye: Secondary | ICD-10-CM | POA: Diagnosis not present

## 2024-02-03 DIAGNOSIS — I5022 Chronic systolic (congestive) heart failure: Secondary | ICD-10-CM | POA: Diagnosis not present

## 2024-02-03 DIAGNOSIS — I951 Orthostatic hypotension: Secondary | ICD-10-CM | POA: Diagnosis not present

## 2024-02-03 DIAGNOSIS — I2510 Atherosclerotic heart disease of native coronary artery without angina pectoris: Secondary | ICD-10-CM | POA: Diagnosis not present

## 2024-02-03 DIAGNOSIS — Z7982 Long term (current) use of aspirin: Secondary | ICD-10-CM | POA: Diagnosis not present

## 2024-02-03 DIAGNOSIS — E872 Acidosis, unspecified: Secondary | ICD-10-CM | POA: Diagnosis not present

## 2024-02-03 DIAGNOSIS — Z87891 Personal history of nicotine dependence: Secondary | ICD-10-CM | POA: Diagnosis not present

## 2024-02-03 DIAGNOSIS — I130 Hypertensive heart and chronic kidney disease with heart failure and stage 1 through stage 4 chronic kidney disease, or unspecified chronic kidney disease: Secondary | ICD-10-CM | POA: Diagnosis not present

## 2024-02-03 DIAGNOSIS — N1831 Chronic kidney disease, stage 3a: Secondary | ICD-10-CM | POA: Diagnosis not present

## 2024-02-03 DIAGNOSIS — S2241XD Multiple fractures of ribs, right side, subsequent encounter for fracture with routine healing: Secondary | ICD-10-CM | POA: Diagnosis not present

## 2024-02-03 DIAGNOSIS — E785 Hyperlipidemia, unspecified: Secondary | ICD-10-CM | POA: Diagnosis not present

## 2024-02-03 DIAGNOSIS — I252 Old myocardial infarction: Secondary | ICD-10-CM | POA: Diagnosis not present

## 2024-02-03 DIAGNOSIS — E860 Dehydration: Secondary | ICD-10-CM | POA: Diagnosis not present

## 2024-02-03 DIAGNOSIS — E876 Hypokalemia: Secondary | ICD-10-CM | POA: Diagnosis not present

## 2024-02-03 DIAGNOSIS — D72829 Elevated white blood cell count, unspecified: Secondary | ICD-10-CM | POA: Diagnosis not present

## 2024-02-03 DIAGNOSIS — Z955 Presence of coronary angioplasty implant and graft: Secondary | ICD-10-CM | POA: Diagnosis not present

## 2024-02-03 DIAGNOSIS — Z7902 Long term (current) use of antithrombotics/antiplatelets: Secondary | ICD-10-CM | POA: Diagnosis not present

## 2024-02-03 DIAGNOSIS — J9621 Acute and chronic respiratory failure with hypoxia: Secondary | ICD-10-CM | POA: Diagnosis not present

## 2024-02-03 DIAGNOSIS — I7090 Unspecified atherosclerosis: Secondary | ICD-10-CM | POA: Diagnosis not present

## 2024-02-03 DIAGNOSIS — K219 Gastro-esophageal reflux disease without esophagitis: Secondary | ICD-10-CM | POA: Diagnosis not present

## 2024-02-04 ENCOUNTER — Other Ambulatory Visit (HOSPITAL_COMMUNITY): Payer: Self-pay | Admitting: Gerontology

## 2024-02-04 DIAGNOSIS — I7090 Unspecified atherosclerosis: Secondary | ICD-10-CM | POA: Diagnosis not present

## 2024-02-04 DIAGNOSIS — Z7902 Long term (current) use of antithrombotics/antiplatelets: Secondary | ICD-10-CM | POA: Diagnosis not present

## 2024-02-04 DIAGNOSIS — Z87891 Personal history of nicotine dependence: Secondary | ICD-10-CM | POA: Diagnosis not present

## 2024-02-04 DIAGNOSIS — I5022 Chronic systolic (congestive) heart failure: Secondary | ICD-10-CM | POA: Diagnosis not present

## 2024-02-04 DIAGNOSIS — I130 Hypertensive heart and chronic kidney disease with heart failure and stage 1 through stage 4 chronic kidney disease, or unspecified chronic kidney disease: Secondary | ICD-10-CM | POA: Diagnosis not present

## 2024-02-04 DIAGNOSIS — E872 Acidosis, unspecified: Secondary | ICD-10-CM | POA: Diagnosis not present

## 2024-02-04 DIAGNOSIS — E860 Dehydration: Secondary | ICD-10-CM | POA: Diagnosis not present

## 2024-02-04 DIAGNOSIS — K219 Gastro-esophageal reflux disease without esophagitis: Secondary | ICD-10-CM | POA: Diagnosis not present

## 2024-02-04 DIAGNOSIS — I951 Orthostatic hypotension: Secondary | ICD-10-CM | POA: Diagnosis not present

## 2024-02-04 DIAGNOSIS — S2241XD Multiple fractures of ribs, right side, subsequent encounter for fracture with routine healing: Secondary | ICD-10-CM | POA: Diagnosis not present

## 2024-02-04 DIAGNOSIS — K529 Noninfective gastroenteritis and colitis, unspecified: Secondary | ICD-10-CM | POA: Diagnosis not present

## 2024-02-04 DIAGNOSIS — H5461 Unqualified visual loss, right eye, normal vision left eye: Secondary | ICD-10-CM | POA: Diagnosis not present

## 2024-02-04 DIAGNOSIS — Z7982 Long term (current) use of aspirin: Secondary | ICD-10-CM | POA: Diagnosis not present

## 2024-02-04 DIAGNOSIS — W19XXXA Unspecified fall, initial encounter: Secondary | ICD-10-CM

## 2024-02-04 DIAGNOSIS — J439 Emphysema, unspecified: Secondary | ICD-10-CM | POA: Diagnosis not present

## 2024-02-04 DIAGNOSIS — N1831 Chronic kidney disease, stage 3a: Secondary | ICD-10-CM | POA: Diagnosis not present

## 2024-02-04 DIAGNOSIS — I2510 Atherosclerotic heart disease of native coronary artery without angina pectoris: Secondary | ICD-10-CM | POA: Diagnosis not present

## 2024-02-04 DIAGNOSIS — E785 Hyperlipidemia, unspecified: Secondary | ICD-10-CM | POA: Diagnosis not present

## 2024-02-04 DIAGNOSIS — Z955 Presence of coronary angioplasty implant and graft: Secondary | ICD-10-CM | POA: Diagnosis not present

## 2024-02-04 DIAGNOSIS — I252 Old myocardial infarction: Secondary | ICD-10-CM | POA: Diagnosis not present

## 2024-02-04 DIAGNOSIS — D72829 Elevated white blood cell count, unspecified: Secondary | ICD-10-CM | POA: Diagnosis not present

## 2024-02-04 DIAGNOSIS — J9621 Acute and chronic respiratory failure with hypoxia: Secondary | ICD-10-CM | POA: Diagnosis not present

## 2024-02-04 DIAGNOSIS — E876 Hypokalemia: Secondary | ICD-10-CM | POA: Diagnosis not present

## 2024-02-09 DIAGNOSIS — Z955 Presence of coronary angioplasty implant and graft: Secondary | ICD-10-CM | POA: Diagnosis not present

## 2024-02-09 DIAGNOSIS — I951 Orthostatic hypotension: Secondary | ICD-10-CM | POA: Diagnosis not present

## 2024-02-09 DIAGNOSIS — K219 Gastro-esophageal reflux disease without esophagitis: Secondary | ICD-10-CM | POA: Diagnosis not present

## 2024-02-09 DIAGNOSIS — H5461 Unqualified visual loss, right eye, normal vision left eye: Secondary | ICD-10-CM | POA: Diagnosis not present

## 2024-02-09 DIAGNOSIS — K529 Noninfective gastroenteritis and colitis, unspecified: Secondary | ICD-10-CM | POA: Diagnosis not present

## 2024-02-09 DIAGNOSIS — J9621 Acute and chronic respiratory failure with hypoxia: Secondary | ICD-10-CM | POA: Diagnosis not present

## 2024-02-09 DIAGNOSIS — I252 Old myocardial infarction: Secondary | ICD-10-CM | POA: Diagnosis not present

## 2024-02-09 DIAGNOSIS — S2241XD Multiple fractures of ribs, right side, subsequent encounter for fracture with routine healing: Secondary | ICD-10-CM | POA: Diagnosis not present

## 2024-02-09 DIAGNOSIS — E785 Hyperlipidemia, unspecified: Secondary | ICD-10-CM | POA: Diagnosis not present

## 2024-02-09 DIAGNOSIS — I5022 Chronic systolic (congestive) heart failure: Secondary | ICD-10-CM | POA: Diagnosis not present

## 2024-02-09 DIAGNOSIS — D72829 Elevated white blood cell count, unspecified: Secondary | ICD-10-CM | POA: Diagnosis not present

## 2024-02-09 DIAGNOSIS — J439 Emphysema, unspecified: Secondary | ICD-10-CM | POA: Diagnosis not present

## 2024-02-09 DIAGNOSIS — Z87891 Personal history of nicotine dependence: Secondary | ICD-10-CM | POA: Diagnosis not present

## 2024-02-09 DIAGNOSIS — N1831 Chronic kidney disease, stage 3a: Secondary | ICD-10-CM | POA: Diagnosis not present

## 2024-02-09 DIAGNOSIS — Z7982 Long term (current) use of aspirin: Secondary | ICD-10-CM | POA: Diagnosis not present

## 2024-02-09 DIAGNOSIS — E860 Dehydration: Secondary | ICD-10-CM | POA: Diagnosis not present

## 2024-02-09 DIAGNOSIS — E872 Acidosis, unspecified: Secondary | ICD-10-CM | POA: Diagnosis not present

## 2024-02-09 DIAGNOSIS — E876 Hypokalemia: Secondary | ICD-10-CM | POA: Diagnosis not present

## 2024-02-09 DIAGNOSIS — I130 Hypertensive heart and chronic kidney disease with heart failure and stage 1 through stage 4 chronic kidney disease, or unspecified chronic kidney disease: Secondary | ICD-10-CM | POA: Diagnosis not present

## 2024-02-09 DIAGNOSIS — I2510 Atherosclerotic heart disease of native coronary artery without angina pectoris: Secondary | ICD-10-CM | POA: Diagnosis not present

## 2024-02-09 DIAGNOSIS — Z7902 Long term (current) use of antithrombotics/antiplatelets: Secondary | ICD-10-CM | POA: Diagnosis not present

## 2024-02-09 DIAGNOSIS — I7090 Unspecified atherosclerosis: Secondary | ICD-10-CM | POA: Diagnosis not present

## 2024-02-17 ENCOUNTER — Other Ambulatory Visit (HOSPITAL_COMMUNITY)

## 2024-02-17 DIAGNOSIS — I7090 Unspecified atherosclerosis: Secondary | ICD-10-CM | POA: Diagnosis not present

## 2024-02-17 DIAGNOSIS — H5461 Unqualified visual loss, right eye, normal vision left eye: Secondary | ICD-10-CM | POA: Diagnosis not present

## 2024-02-17 DIAGNOSIS — I5022 Chronic systolic (congestive) heart failure: Secondary | ICD-10-CM | POA: Diagnosis not present

## 2024-02-17 DIAGNOSIS — I2510 Atherosclerotic heart disease of native coronary artery without angina pectoris: Secondary | ICD-10-CM | POA: Diagnosis not present

## 2024-02-17 DIAGNOSIS — Z955 Presence of coronary angioplasty implant and graft: Secondary | ICD-10-CM | POA: Diagnosis not present

## 2024-02-17 DIAGNOSIS — K529 Noninfective gastroenteritis and colitis, unspecified: Secondary | ICD-10-CM | POA: Diagnosis not present

## 2024-02-17 DIAGNOSIS — D72829 Elevated white blood cell count, unspecified: Secondary | ICD-10-CM | POA: Diagnosis not present

## 2024-02-17 DIAGNOSIS — K219 Gastro-esophageal reflux disease without esophagitis: Secondary | ICD-10-CM | POA: Diagnosis not present

## 2024-02-17 DIAGNOSIS — I951 Orthostatic hypotension: Secondary | ICD-10-CM | POA: Diagnosis not present

## 2024-02-17 DIAGNOSIS — I252 Old myocardial infarction: Secondary | ICD-10-CM | POA: Diagnosis not present

## 2024-02-17 DIAGNOSIS — I130 Hypertensive heart and chronic kidney disease with heart failure and stage 1 through stage 4 chronic kidney disease, or unspecified chronic kidney disease: Secondary | ICD-10-CM | POA: Diagnosis not present

## 2024-02-17 DIAGNOSIS — N1831 Chronic kidney disease, stage 3a: Secondary | ICD-10-CM | POA: Diagnosis not present

## 2024-02-17 DIAGNOSIS — J439 Emphysema, unspecified: Secondary | ICD-10-CM | POA: Diagnosis not present

## 2024-02-17 DIAGNOSIS — S2241XD Multiple fractures of ribs, right side, subsequent encounter for fracture with routine healing: Secondary | ICD-10-CM | POA: Diagnosis not present

## 2024-02-17 DIAGNOSIS — Z7982 Long term (current) use of aspirin: Secondary | ICD-10-CM | POA: Diagnosis not present

## 2024-02-17 DIAGNOSIS — Z87891 Personal history of nicotine dependence: Secondary | ICD-10-CM | POA: Diagnosis not present

## 2024-02-17 DIAGNOSIS — J9621 Acute and chronic respiratory failure with hypoxia: Secondary | ICD-10-CM | POA: Diagnosis not present

## 2024-02-17 DIAGNOSIS — E872 Acidosis, unspecified: Secondary | ICD-10-CM | POA: Diagnosis not present

## 2024-02-17 DIAGNOSIS — Z7902 Long term (current) use of antithrombotics/antiplatelets: Secondary | ICD-10-CM | POA: Diagnosis not present

## 2024-02-17 DIAGNOSIS — E876 Hypokalemia: Secondary | ICD-10-CM | POA: Diagnosis not present

## 2024-02-17 DIAGNOSIS — E785 Hyperlipidemia, unspecified: Secondary | ICD-10-CM | POA: Diagnosis not present

## 2024-02-17 DIAGNOSIS — E860 Dehydration: Secondary | ICD-10-CM | POA: Diagnosis not present

## 2024-03-01 DIAGNOSIS — Z7982 Long term (current) use of aspirin: Secondary | ICD-10-CM | POA: Diagnosis not present

## 2024-03-01 DIAGNOSIS — E860 Dehydration: Secondary | ICD-10-CM | POA: Diagnosis not present

## 2024-03-01 DIAGNOSIS — H5461 Unqualified visual loss, right eye, normal vision left eye: Secondary | ICD-10-CM | POA: Diagnosis not present

## 2024-03-01 DIAGNOSIS — E785 Hyperlipidemia, unspecified: Secondary | ICD-10-CM | POA: Diagnosis not present

## 2024-03-01 DIAGNOSIS — S2241XD Multiple fractures of ribs, right side, subsequent encounter for fracture with routine healing: Secondary | ICD-10-CM | POA: Diagnosis not present

## 2024-03-01 DIAGNOSIS — D72829 Elevated white blood cell count, unspecified: Secondary | ICD-10-CM | POA: Diagnosis not present

## 2024-03-01 DIAGNOSIS — K219 Gastro-esophageal reflux disease without esophagitis: Secondary | ICD-10-CM | POA: Diagnosis not present

## 2024-03-01 DIAGNOSIS — N1831 Chronic kidney disease, stage 3a: Secondary | ICD-10-CM | POA: Diagnosis not present

## 2024-03-01 DIAGNOSIS — I130 Hypertensive heart and chronic kidney disease with heart failure and stage 1 through stage 4 chronic kidney disease, or unspecified chronic kidney disease: Secondary | ICD-10-CM | POA: Diagnosis not present

## 2024-03-01 DIAGNOSIS — Z955 Presence of coronary angioplasty implant and graft: Secondary | ICD-10-CM | POA: Diagnosis not present

## 2024-03-01 DIAGNOSIS — K529 Noninfective gastroenteritis and colitis, unspecified: Secondary | ICD-10-CM | POA: Diagnosis not present

## 2024-03-01 DIAGNOSIS — I951 Orthostatic hypotension: Secondary | ICD-10-CM | POA: Diagnosis not present

## 2024-03-01 DIAGNOSIS — J9621 Acute and chronic respiratory failure with hypoxia: Secondary | ICD-10-CM | POA: Diagnosis not present

## 2024-03-01 DIAGNOSIS — Z7902 Long term (current) use of antithrombotics/antiplatelets: Secondary | ICD-10-CM | POA: Diagnosis not present

## 2024-03-01 DIAGNOSIS — E876 Hypokalemia: Secondary | ICD-10-CM | POA: Diagnosis not present

## 2024-03-01 DIAGNOSIS — I7090 Unspecified atherosclerosis: Secondary | ICD-10-CM | POA: Diagnosis not present

## 2024-03-01 DIAGNOSIS — I5022 Chronic systolic (congestive) heart failure: Secondary | ICD-10-CM | POA: Diagnosis not present

## 2024-03-01 DIAGNOSIS — Z87891 Personal history of nicotine dependence: Secondary | ICD-10-CM | POA: Diagnosis not present

## 2024-03-01 DIAGNOSIS — I2510 Atherosclerotic heart disease of native coronary artery without angina pectoris: Secondary | ICD-10-CM | POA: Diagnosis not present

## 2024-03-01 DIAGNOSIS — E872 Acidosis, unspecified: Secondary | ICD-10-CM | POA: Diagnosis not present

## 2024-03-01 DIAGNOSIS — I252 Old myocardial infarction: Secondary | ICD-10-CM | POA: Diagnosis not present

## 2024-03-01 DIAGNOSIS — J439 Emphysema, unspecified: Secondary | ICD-10-CM | POA: Diagnosis not present

## 2024-03-30 ENCOUNTER — Encounter: Payer: Self-pay | Admitting: Family Medicine

## 2024-03-30 ENCOUNTER — Ambulatory Visit: Payer: Self-pay | Admitting: Family Medicine

## 2024-03-30 VITALS — BP 90/62 | HR 90 | Ht 60.0 in | Wt 121.0 lb

## 2024-03-30 DIAGNOSIS — I10 Essential (primary) hypertension: Secondary | ICD-10-CM

## 2024-03-30 DIAGNOSIS — D509 Iron deficiency anemia, unspecified: Secondary | ICD-10-CM

## 2024-03-30 DIAGNOSIS — E559 Vitamin D deficiency, unspecified: Secondary | ICD-10-CM

## 2024-03-30 DIAGNOSIS — E038 Other specified hypothyroidism: Secondary | ICD-10-CM

## 2024-03-30 DIAGNOSIS — Z1211 Encounter for screening for malignant neoplasm of colon: Secondary | ICD-10-CM

## 2024-03-30 DIAGNOSIS — R7303 Prediabetes: Secondary | ICD-10-CM

## 2024-03-30 DIAGNOSIS — Z1159 Encounter for screening for other viral diseases: Secondary | ICD-10-CM | POA: Diagnosis not present

## 2024-03-30 DIAGNOSIS — Z1231 Encounter for screening mammogram for malignant neoplasm of breast: Secondary | ICD-10-CM

## 2024-03-30 DIAGNOSIS — E538 Deficiency of other specified B group vitamins: Secondary | ICD-10-CM

## 2024-03-30 MED ORDER — CLONIDINE HCL 0.3 MG PO TABS: 0.3000 mg | ORAL_TABLET | Freq: Two times a day (BID) | ORAL | 3 refills | Status: DC | PRN

## 2024-03-30 NOTE — Progress Notes (Signed)
 New Patient Office Visit   Subjective   Patient ID: [name], female    DOB: 17-Nov-1950  Age: 73 y.o. MRN: [id number]  CC:  Chief Complaint  Patient presents with   Establish Care    HPI [name] 73 year old female, presents to establish care. She  has a past medical history of Anxiety, Blind, Colitis (09/07/2011), CVA (cerebral infarction), GERD (gastroesophageal reflux disease), Hyperlipidemia, Hypertension, Myocardial infarct [facility]), Severe major depression with psychotic features (HCC), and Stroke (HCC) (2002).  HPI  The patient has a chronic history of hypertension with fluctuating control over time. She denies associated symptoms such as headaches, palpitations, or shortness of breath. Cardiovascular risk factors include dyslipidemia, smoking or tobacco exposure, and a sedentary lifestyle. Previous antihypertensive treatments have included beta blockers, angiotensin receptor blockers (ARBs), and central alpha-2 agonists. The current treatment regimen has resulted in significant improvement in blood pressure control. However, noncompliance with regular exercise remains a concern. Her medical history is also significant for coronary artery disease (CAD) with a history of myocardial infarction (MI), as well as a cerebrovascular accident (CVA).  Outpatient Encounter Medications as of 03/30/2024  Medication Sig   acetaminophen  (TYLENOL ) 650 MG CR tablet Take 1,300 mg by mouth every 8 (eight) hours as needed for pain.   aspirin  EC 81 MG tablet Take 81 mg by mouth daily. Swallow whole.   citalopram  (CELEXA ) 20 MG tablet Take 20 mg by mouth daily.   cloNIDine  (CATAPRES ) 0.3 MG tablet Take 1 tablet (0.3 mg total) by mouth 2 (two) times daily as needed.   clopidogrel  (PLAVIX ) 75 MG tablet Take 1 tablet (75 mg total) by mouth daily.   fluticasone (FLONASE) 50 MCG/ACT nasal spray Place 1 spray into both nostrils daily as needed for rhinitis or allergies.    Fluticasone-Umeclidin-Vilant (TRELEGY ELLIPTA ) 100-62.5-25 MCG/ACT AEPB Inhale 1 puff into the lungs in the morning.   folic acid  (FOLVITE ) 1 MG tablet Take 1 tablet (1 mg total) by mouth daily.   Ipratropium-Albuterol  (COMBIVENT ) 20-100 MCG/ACT AERS respimat Inhale 1 puff into the lungs every 6 (six) hours as needed for wheezing or shortness of breath.   losartan  (COZAAR ) 25 MG tablet Take 12.5 mg by mouth daily.   metoprolol  succinate (TOPROL  XL) 50 MG 24 hr tablet Take 1 tablet (50 mg total) by mouth daily. Take with or immediately following a meal.   mirtazapine  (REMERON ) 7.5 MG tablet Take 7.5 mg by mouth at bedtime.   montelukast  (SINGULAIR ) 10 MG tablet Take 10 mg by mouth at bedtime.   ondansetron  (ZOFRAN ) 4 MG tablet Take 1 tablet (4 mg total) by mouth every 8 (eight) hours as needed for nausea or vomiting.   oxyCODONE -acetaminophen  (PERCOCET) 5-325 MG tablet Take 1 tablet by mouth every 4 (four) hours as needed.   pantoprazole  (PROTONIX ) 40 MG tablet Take 40 mg by mouth daily.   torsemide  (DEMADEX ) 20 MG tablet Take 40 mg by mouth 2 (two) times daily.   traMADol  (ULTRAM ) 50 MG tablet Take 50 mg by mouth 2 (two) times daily as needed for moderate pain (pain score 4-6).   umeclidinium-vilanterol (ANORO ELLIPTA ) 62.5-25 MCG/ACT AEPB Inhale 1 puff into the lungs daily.   vitamin B-12 (CYANOCOBALAMIN ) 500 MCG tablet Take 1 tablet (500 mcg total) by mouth daily.   [DISCONTINUED] cloNIDine  (CATAPRES ) 0.3 MG tablet Take 0.3 mg by mouth 2 (two) times daily.   No facility-administered encounter medications on file as of 03/30/2024.    Past Surgical History:  Procedure Laterality Date   ABDOMINAL HYSTERECTOMY     BILATERAL SALPINGOOPHORECTOMY     BIOPSY  05/08/2022   Procedure: BIOPSY;  Surgeon: Eartha Angelia Sieving, MD;  [facility];  Service: Gastroenterology;;   CHOLECYSTECTOMY N/A 10/15/2021   Procedure: LAPAROSCOPIC CHOLECYSTECTOMY WITH INTRAOPERATIVE CHOLANGIOGRAM;   Surgeon: Mavis Anes, MD;  Location: [facility];  Service: General;  Laterality: N/A;   COLONOSCOPY N/A 07/17/2014   RMR: Extremely redundant colon. Colonic diverticulosis. Inadequate prepartation precluded examination of all the rectal and colonic.  I suspect slow colonic transit in the setting of a markely redundant colon.   CORONARY ANGIOPLASTY WITH STENT PLACEMENT  [id number],[id number]   cyst removed from left wrist     ERCP N/A 10/10/2021   Procedure: ENDOSCOPIC RETROGRADE CHOLANGIOPANCREATOGRAPHY (ERCP);  Surgeon: Golda Claudis PENNER, MD;  Location: [facility];  Service: Gastroenterology;  Laterality: N/A;   ERCP N/A 10/13/2021   Procedure: ENDOSCOPIC RETROGRADE CHOLANGIOPANCREATOGRAPHY (ERCP);  Surgeon: Golda Claudis PENNER, MD;  Location: [facility];  Service: Gastroenterology;  Laterality: N/A;   ESOPHAGEAL DILATION  05/08/2022   Procedure: ESOPHAGEAL DILATION;  Surgeon: Eartha Angelia, Sieving, MD;  Location: [facility];  Service: Gastroenterology;;  savory dilation'   ESOPHAGOGASTRODUODENOSCOPY  09/05/2004   Dr Vilma gastritis, candida esophagitis   ESOPHAGOGASTRODUODENOSCOPY  09/07/2011   DOQ:Mpwh, esophageal in the distal esophagus/Mild gastritis   ESOPHAGOGASTRODUODENOSCOPY (EGD) WITH PROPOFOL  N/A 05/08/2022   Procedure: ESOPHAGOGASTRODUODENOSCOPY (EGD) WITH PROPOFOL ;  Surgeon: Eartha Angelia Sieving, MD;  [facility];  Service: Gastroenterology;  Laterality: N/A;  with possible esophageal dilation   Lumps removed from each breast     SPHINCTEROTOMY N/A 10/10/2021   Procedure: SPHINCTEROTOMY INCOMPLETE, STONE NOT REMOVED;  Surgeon: Golda Claudis PENNER, MD;  Location: [facility];  Service: Gastroenterology;  Laterality: N/A;    Review of Systems  Constitutional:  Negative for chills and fever.  Respiratory:  Negative for shortness of breath.   Cardiovascular:  Negative for palpitations.  Gastrointestinal:  Negative for abdominal pain.  Genitourinary:  Negative for dysuria.   Neurological:  Negative for dizziness and headaches.      Objective    BP 90/62   Pulse 90   Ht 5' (1.524 m)   Wt 121 lb (54.9 kg)   SpO2 92%   BMI 23.63 kg/m   Physical Exam Vitals reviewed.  Constitutional:      General: She is not in acute distress.    Appearance: Normal appearance. She is not ill-appearing, toxic-appearing or diaphoretic.  HENT:     Head: Normocephalic.  Eyes:     General:        Right eye: No discharge.        Left eye: No discharge.     Conjunctiva/sclera: Conjunctivae normal.  Cardiovascular:     Rate and Rhythm: Normal rate.     Pulses: Normal pulses.     Heart sounds: Normal heart sounds.  Pulmonary:     Effort: Pulmonary effort is normal. No respiratory distress.     Breath sounds: Normal breath sounds.     Comments: 3 liters nasal cannula  Abdominal:     General: Bowel sounds are normal.     Palpations: Abdomen is soft.     Tenderness: There is no abdominal tenderness. There is no right CVA tenderness, left CVA tenderness or guarding.  Musculoskeletal:     Cervical back: Normal range of motion.  Skin:    General: Skin is warm and dry.     Capillary Refill:  Capillary refill takes less than 2 seconds.  Neurological:     Mental Status: She is alert and oriented to person, place, and time.     Coordination: Coordination abnormal.     Gait: Gait abnormal.  Psychiatric:        Mood and Affect: Mood normal.        Behavior: Behavior normal.       Assessment & Plan:  Screening mammogram for breast cancer  Screen for colon cancer -     Cologuard  TSH (thyroid -stimulating hormone deficiency)  Primary hypertension -     Lipid panel -     CMP14+EGFR -     CBC with Differential/Platelet  Prediabetes -     Hemoglobin A1c  Vitamin D deficiency -     VITAMIN D 25 Hydroxy (Vit-D Deficiency, Fractures)  Vitamin B12 deficiency -     Vitamin B12  Need for hepatitis C screening test -     Hepatitis C antibody  Iron deficiency  anemia, unspecified iron deficiency anemia type -     Iron, TIBC and Ferritin Panel  Essential hypertension Assessment & Plan: Vitals:   03/30/24 1020 03/30/24 1045  BP: (!) 93/54 90/62  Over treating Blood pressure Patient reports taking Clonidine  0.3 twice daily Metoprolol  succinate 50 mg once daily Losartan  12.5 mg once daily Advise on New plan: Take half tablet daily which will be metoprolol  25 mg once daily, continue Losartan  12.5 mg once daily Clonidine  0.3 mg twice daily PRN use only Follow up in 2-4 weeks with at home blood pressure readings to monitor trends  Labs ordered. Discussed with  patient to monitor their blood pressure regularly and maintain a heart-healthy diet rich in fruits, vegetables, whole grains, and low-fat dairy, while reducing sodium intake to less than 2,300 mg per day. Regular physical activity, such as 30 minutes of moderate exercise most days of the week, will help lower blood pressure and improve overall cardiovascular health. Avoiding smoking, limiting alcohol  consumption, and managing stress. Take  prescribed medication, & take it as directed and avoid skipping doses. Seek emergency care if your blood pressure is (over 180/100) or you experience chest pain, shortness of breath, or sudden vision changes.Patient verbalizes understanding regarding plan of care and all questions answered.    Other orders -     cloNIDine  HCl; Take 1 tablet (0.3 mg total) by mouth 2 (two) times daily as needed.  Dispense: 60 tablet; Refill: 3    Return in about 3 months (around 06/30/2024), or if symptoms worsen or fail to improve, for hypertension, pre-diabetes.   Hilario Kidd Wilhelmena Falter, FNP

## 2024-03-30 NOTE — Assessment & Plan Note (Addendum)
 Vitals:   03/30/24 1020 03/30/24 1045  BP: (!) 93/54 90/62  Over treating Blood pressure Patient reports taking Clonidine  0.3 twice daily Metoprolol  succinate 50 mg once daily Losartan  12.5 mg once daily Advise on New plan: Take half tablet daily which will be metoprolol  25 mg once daily, continue Losartan  12.5 mg once daily Clonidine  0.3 mg twice daily PRN use only Follow up in 2-4 weeks with at home blood pressure readings to monitor trends  Labs ordered. Discussed with  patient to monitor their blood pressure regularly and maintain a heart-healthy diet rich in fruits, vegetables, whole grains, and low-fat dairy, while reducing sodium intake to less than 2,300 mg per day. Regular physical activity, such as 30 minutes of moderate exercise most days of the week, will help lower blood pressure and improve overall cardiovascular health. Avoiding smoking, limiting alcohol  consumption, and managing stress. Take  prescribed medication, & take it as directed and avoid skipping doses. Seek emergency care if your blood pressure is (over 180/100) or you experience chest pain, shortness of breath, or sudden vision changes.Patient verbalizes understanding regarding plan of care and all questions answered.

## 2024-03-30 NOTE — Patient Instructions (Signed)

## 2024-04-01 LAB — CBC WITH DIFFERENTIAL/PLATELET
Basophils Absolute: 0.1 x10E3/uL (ref 0.0–0.2)
Basos: 1 %
EOS (ABSOLUTE): 0.1 x10E3/uL (ref 0.0–0.4)
Eos: 1 %
Hematocrit: 31.4 % — ABNORMAL LOW (ref 34.0–46.6)
Hemoglobin: 10 g/dL — ABNORMAL LOW (ref 11.1–15.9)
Immature Grans (Abs): 0 x10E3/uL (ref 0.0–0.1)
Immature Granulocytes: 0 %
Lymphocytes Absolute: 1.7 x10E3/uL (ref 0.7–3.1)
Lymphs: 15 %
MCH: 32.3 pg (ref 26.6–33.0)
MCHC: 31.8 g/dL (ref 31.5–35.7)
MCV: 101 fL — ABNORMAL HIGH (ref 79–97)
Monocytes Absolute: 0.6 x10E3/uL (ref 0.1–0.9)
Monocytes: 6 %
Neutrophils Absolute: 8.3 x10E3/uL — ABNORMAL HIGH (ref 1.4–7.0)
Neutrophils: 77 %
Platelets: 348 x10E3/uL (ref 150–450)
RBC: 3.1 x10E6/uL — ABNORMAL LOW (ref 3.77–5.28)
RDW: 11.3 % — ABNORMAL LOW (ref 11.7–15.4)
WBC: 10.8 x10E3/uL (ref 3.4–10.8)

## 2024-04-01 LAB — CMP14+EGFR
ALT: 8 IU/L (ref 0–32)
AST: 18 IU/L (ref 0–40)
Albumin: 4 g/dL (ref 3.8–4.8)
Alkaline Phosphatase: 297 IU/L — ABNORMAL HIGH (ref 44–121)
BUN/Creatinine Ratio: 14 (ref 12–28)
BUN: 15 mg/dL (ref 8–27)
Bilirubin Total: 0.3 mg/dL (ref 0.0–1.2)
CO2: 21 mmol/L (ref 20–29)
Calcium: 9.2 mg/dL (ref 8.7–10.3)
Chloride: 101 mmol/L (ref 96–106)
Creatinine, Ser: 1.05 mg/dL — ABNORMAL HIGH (ref 0.57–1.00)
Globulin, Total: 4.5 g/dL (ref 1.5–4.5)
Glucose: 162 mg/dL — ABNORMAL HIGH (ref 70–99)
Potassium: 4.3 mmol/L (ref 3.5–5.2)
Sodium: 140 mmol/L (ref 134–144)
Total Protein: 8.5 g/dL (ref 6.0–8.5)
eGFR: 56 mL/min/1.73 — ABNORMAL LOW (ref >59)

## 2024-04-01 LAB — IRON,TIBC AND FERRITIN PANEL
Ferritin: 431 ng/mL — ABNORMAL HIGH (ref 15–150)
Iron Saturation: 14 — AB (ref 15–55)
Iron: 30 ug/dL (ref 27–139)
Total Iron Binding Capacity: 210 ug/dL — AB (ref 250–450)
UIBC: 180 ug/dL (ref 118–369)

## 2024-04-01 LAB — LIPID PANEL
Chol/HDL Ratio: 3.9 ratio (ref 0.0–4.4)
Cholesterol, Total: 267 mg/dL — ABNORMAL HIGH (ref 100–199)
HDL: 69 mg/dL (ref >39)
LDL Chol Calc (NIH): 177 mg/dL — ABNORMAL HIGH (ref 0–99)
Triglycerides: 118 mg/dL (ref 0–149)
VLDL Cholesterol Cal: 21 mg/dL (ref 5–40)

## 2024-04-01 LAB — VITAMIN B12: Vitamin B-12: 1806 pg/mL — ABNORMAL HIGH (ref 232–1245)

## 2024-04-01 LAB — HEMOGLOBIN A1C
Est. average glucose Bld gHb Est-mCnc: 120 mg/dL
Hgb A1c MFr Bld: 5.8 % — ABNORMAL HIGH (ref 4.8–5.6)

## 2024-04-01 LAB — HEPATITIS C ANTIBODY: Hep C Virus Ab: NONREACTIVE

## 2024-04-01 LAB — VITAMIN D 25 HYDROXY (VIT D DEFICIENCY, FRACTURES): Vit D, 25-Hydroxy: <4 ng/mL — ABNORMAL LOW (ref 30.0–100.0)

## 2024-04-07 ENCOUNTER — Other Ambulatory Visit: Payer: Self-pay | Admitting: Family Medicine

## 2024-04-07 ENCOUNTER — Ambulatory Visit: Payer: Self-pay | Admitting: Family Medicine

## 2024-04-07 DIAGNOSIS — R748 Abnormal levels of other serum enzymes: Secondary | ICD-10-CM

## 2024-04-07 MED ORDER — ROSUVASTATIN CALCIUM 40 MG PO TABS: 40.0000 mg | ORAL_TABLET | Freq: Every day | ORAL | 3 refills | Status: DC

## 2024-04-07 NOTE — Progress Notes (Signed)
 Please inform patient,  Alkaline Phosphate levels elevated patient needs to follow up with GI  [facility] at [facility] [phone]   Vitamin D  levels low, I advise to taking  over the counter supplements of vitamin D  2,000 IU daily  to prevent low vitamin D  levels.  Consume Vitamin D -Rich Foods: Fatty Fish: Salmon, mackerel, tuna, and sardines. Egg Yolks: A good source if eaten whole. Fortified Foods: Milk, orange juice, cereals, and plant-based milks (like almond or soy milk). Mushrooms: Especially those exposed to sunlight or UV light. Cod Liver Oil: A concentrated source of vitamin D . Including these foods in your diet can help boost vitamin D  levels    Cholesterol levels elevated, start lifestyle modifications and follow diet low in saturated fat. Plan: Crestor  40 mg once daily- medication sent to her pharmacy,  Diet to Lower Cholesterol Eat More: Oats, beans, and lentils: High in soluble fiber. Fatty fish: Salmon, tuna (rich in omega-3s). Nuts and seeds: Almonds, walnuts, flaxseeds. Fruits and vegetables: Apples, berries, leafy greens. Healthy fats: Olive oil, avocado.    Hemoglobin A1c 5.8 indicates prediabetes - no medication intervention just lifestyle changes   Pre-Diabetes Diet  Eat More:  Whole grains: Oats, quinoa, brown rice. Vegetables: Leafy greens, broccoli, green beans. Fruits: Low-sugar like berries, apples. Lean proteins: Chicken, fish, beans, eggs. Healthy fats: Nuts, seeds, avocado, olive oil.   Vitamin B12 levels indicate overtreatment. Please discontinue daily supplementation and instead take vitamin B12 only three times per week as needed. This will help maintain appropriate levels and avoid unnecessary excess.  Kidney function stable.

## 2024-04-12 DIAGNOSIS — Z1211 Encounter for screening for malignant neoplasm of colon: Secondary | ICD-10-CM | POA: Diagnosis not present

## 2024-04-19 LAB — COLOGUARD: COLOGUARD: NEGATIVE

## 2024-04-24 ENCOUNTER — Telehealth: Payer: Self-pay | Admitting: Internal Medicine

## 2024-04-24 DIAGNOSIS — J449 Chronic obstructive pulmonary disease, unspecified: Secondary | ICD-10-CM

## 2024-04-24 NOTE — Telephone Encounter (Unsigned)
 Copied from CRM [id number]. Topic: Clinical - Medication Refill >> Apr 24, 2024 10:38 AM Benton O wrote: Medication: Ipratropium-Albuterol  (COMBIVENT ) 20-100 MCG/ACT AERS respimat   Has the patient contacted their pharmacy? Yes no refills its outdated and to reach out to doctor  (Agent: If no, request that the patient contact the pharmacy for the refill. If patient does not wish to contact the pharmacy document the reason why and proceed with request.) (Agent: If yes, when and what did the pharmacy advise?)  This is the patient's preferred pharmacy:  [facility] [phone] - [place], [place] - [address] AT [facility] OF [address] [place] [address] Phone: [phone] Fax: [phone]  Is this the correct pharmacy for this prescription? Yes If no, delete pharmacy and type the correct one.  [facility] [phone] - [place], [place] - [address] AT [facility] OF [address]  [place] [address] Phone: [phone] Fax: [phone]   Has the prescription been filled recently? No  Is the patient out of the medication? Yes  Has the patient been seen for an appointment in the last year OR does the patient have an upcoming appointment? Yes  Can we respond through MyChart? No  Agent: Please be advised that Rx refills may take up to 3 business days. We ask that you follow-up with your pharmacy.

## 2024-04-25 MED ORDER — IPRATROPIUM-ALBUTEROL 20-100 MCG/ACT IN AERS: 1.0000 | INHALATION_SPRAY | Freq: Four times a day (QID) | RESPIRATORY_TRACT | 4 refills | Status: AC | PRN

## 2024-04-25 NOTE — Telephone Encounter (Signed)
 Spoke with Oneil patient's son regarding refill for CombiventI sent the Rx to the pharmacy. Nothing else further needed.

## 2024-04-26 ENCOUNTER — Encounter (INDEPENDENT_AMBULATORY_CARE_PROVIDER_SITE_OTHER): Payer: Self-pay | Admitting: *Deleted

## 2024-05-04 ENCOUNTER — Other Ambulatory Visit: Payer: Self-pay | Admitting: Family Medicine

## 2024-05-05 ENCOUNTER — Telehealth: Payer: Self-pay | Admitting: Internal Medicine

## 2024-05-05 NOTE — Telephone Encounter (Signed)
 Spoke with son Alonso) - had question about chest pain.  States his mom had one episode yesterday of chest hurting but feels confident that she is having gas pains & holding it in instead of passing.  Has been fine today.  States BP has been running good.  Denies any n/v, sob, sweating & no c/o increased SOB with any activity.  Does not have NTG at home.  Suggested that he may try Gas-X or mylicon or Beeno to see if that helps.  Continue to monitor for now but if persist should see pcp first.  If develops any cardiac symptoms, she will let us  know.

## 2024-05-05 NOTE — Telephone Encounter (Signed)
   Pt c/o of Chest Pain: STAT if active CP, including tightness, pressure, jaw pain, radiating pain to shoulder/upper arm/back, CP unrelieved by Nitro. Symptoms reported of SOB, nausea, vomiting, sweating.  1. Are you having CP right now? no    2. Are you experiencing any other symptoms (ex. SOB, nausea, vomiting, sweating)? no   3. Is your CP continuous or coming and going? Coming and going   4. Have you taken Nitroglycerin ? no   5. How long have you been experiencing CP? Since yesterday    6. If NO CP at time of call then end call with telling Pt to call back or call 911 if Chest pain returns prior to return call from triage team.

## 2024-05-06 ENCOUNTER — Inpatient Hospital Stay (HOSPITAL_COMMUNITY)
Admission: EM | Admit: 2024-05-06 | Discharge: 2024-05-11 | DRG: 193 | Disposition: A | Discharge status: Home Health | Attending: Internal Medicine | Admitting: Internal Medicine

## 2024-05-06 ENCOUNTER — Other Ambulatory Visit: Payer: Self-pay

## 2024-05-06 ENCOUNTER — Emergency Department (HOSPITAL_COMMUNITY)

## 2024-05-06 ENCOUNTER — Encounter (HOSPITAL_COMMUNITY): Payer: Self-pay | Admitting: Emergency Medicine

## 2024-05-06 DIAGNOSIS — R059 Cough, unspecified: Secondary | ICD-10-CM | POA: Diagnosis not present

## 2024-05-06 DIAGNOSIS — F419 Anxiety disorder, unspecified: Secondary | ICD-10-CM | POA: Diagnosis present

## 2024-05-06 DIAGNOSIS — J440 Chronic obstructive pulmonary disease with acute lower respiratory infection: Secondary | ICD-10-CM | POA: Diagnosis present

## 2024-05-06 DIAGNOSIS — D539 Nutritional anemia, unspecified: Secondary | ICD-10-CM | POA: Diagnosis present

## 2024-05-06 DIAGNOSIS — I10 Essential (primary) hypertension: Secondary | ICD-10-CM | POA: Diagnosis present

## 2024-05-06 DIAGNOSIS — Z880 Allergy status to penicillin: Secondary | ICD-10-CM

## 2024-05-06 DIAGNOSIS — E785 Hyperlipidemia, unspecified: Secondary | ICD-10-CM | POA: Diagnosis present

## 2024-05-06 DIAGNOSIS — Z90722 Acquired absence of ovaries, bilateral: Secondary | ICD-10-CM

## 2024-05-06 DIAGNOSIS — I110 Hypertensive heart disease with heart failure: Secondary | ICD-10-CM | POA: Diagnosis not present

## 2024-05-06 DIAGNOSIS — J9601 Acute respiratory failure with hypoxia: Principal | ICD-10-CM

## 2024-05-06 DIAGNOSIS — Z820 Family history of epilepsy and other diseases of the nervous system: Secondary | ICD-10-CM

## 2024-05-06 DIAGNOSIS — Z87891 Personal history of nicotine dependence: Secondary | ICD-10-CM | POA: Diagnosis not present

## 2024-05-06 DIAGNOSIS — Z955 Presence of coronary angioplasty implant and graft: Secondary | ICD-10-CM | POA: Diagnosis not present

## 2024-05-06 DIAGNOSIS — D72829 Elevated white blood cell count, unspecified: Secondary | ICD-10-CM | POA: Diagnosis not present

## 2024-05-06 DIAGNOSIS — E43 Unspecified severe protein-calorie malnutrition: Secondary | ICD-10-CM | POA: Diagnosis present

## 2024-05-06 DIAGNOSIS — Z91030 Bee allergy status: Secondary | ICD-10-CM

## 2024-05-06 DIAGNOSIS — Z7902 Long term (current) use of antithrombotics/antiplatelets: Secondary | ICD-10-CM | POA: Diagnosis not present

## 2024-05-06 DIAGNOSIS — Z7982 Long term (current) use of aspirin: Secondary | ICD-10-CM | POA: Diagnosis not present

## 2024-05-06 DIAGNOSIS — J9621 Acute and chronic respiratory failure with hypoxia: Secondary | ICD-10-CM | POA: Diagnosis not present

## 2024-05-06 DIAGNOSIS — Z8673 Personal history of transient ischemic attack (TIA), and cerebral infarction without residual deficits: Secondary | ICD-10-CM

## 2024-05-06 DIAGNOSIS — R0789 Other chest pain: Secondary | ICD-10-CM | POA: Diagnosis present

## 2024-05-06 DIAGNOSIS — J189 Pneumonia, unspecified organism: Secondary | ICD-10-CM | POA: Diagnosis not present

## 2024-05-06 DIAGNOSIS — J181 Lobar pneumonia, unspecified organism: Secondary | ICD-10-CM | POA: Diagnosis not present

## 2024-05-06 DIAGNOSIS — F32A Depression, unspecified: Secondary | ICD-10-CM | POA: Diagnosis present

## 2024-05-06 DIAGNOSIS — Z8249 Family history of ischemic heart disease and other diseases of the circulatory system: Secondary | ICD-10-CM | POA: Diagnosis not present

## 2024-05-06 DIAGNOSIS — R079 Chest pain, unspecified: Secondary | ICD-10-CM | POA: Diagnosis not present

## 2024-05-06 DIAGNOSIS — R918 Other nonspecific abnormal finding of lung field: Secondary | ICD-10-CM | POA: Diagnosis not present

## 2024-05-06 DIAGNOSIS — J449 Chronic obstructive pulmonary disease, unspecified: Secondary | ICD-10-CM | POA: Diagnosis not present

## 2024-05-06 DIAGNOSIS — K219 Gastro-esophageal reflux disease without esophagitis: Secondary | ICD-10-CM | POA: Diagnosis not present

## 2024-05-06 DIAGNOSIS — R7302 Impaired glucose tolerance (oral): Secondary | ICD-10-CM | POA: Diagnosis present

## 2024-05-06 DIAGNOSIS — Z7952 Long term (current) use of systemic steroids: Secondary | ICD-10-CM

## 2024-05-06 DIAGNOSIS — I5032 Chronic diastolic (congestive) heart failure: Secondary | ICD-10-CM | POA: Diagnosis present

## 2024-05-06 DIAGNOSIS — Z9079 Acquired absence of other genital organ(s): Secondary | ICD-10-CM

## 2024-05-06 DIAGNOSIS — E538 Deficiency of other specified B group vitamins: Secondary | ICD-10-CM | POA: Diagnosis present

## 2024-05-06 DIAGNOSIS — Z90710 Acquired absence of both cervix and uterus: Secondary | ICD-10-CM

## 2024-05-06 DIAGNOSIS — I2510 Atherosclerotic heart disease of native coronary artery without angina pectoris: Secondary | ICD-10-CM | POA: Diagnosis present

## 2024-05-06 DIAGNOSIS — Z833 Family history of diabetes mellitus: Secondary | ICD-10-CM

## 2024-05-06 DIAGNOSIS — Z9049 Acquired absence of other specified parts of digestive tract: Secondary | ICD-10-CM

## 2024-05-06 DIAGNOSIS — K5904 Chronic idiopathic constipation: Secondary | ICD-10-CM | POA: Diagnosis present

## 2024-05-06 DIAGNOSIS — Z91041 Radiographic dye allergy status: Secondary | ICD-10-CM

## 2024-05-06 DIAGNOSIS — I252 Old myocardial infarction: Secondary | ICD-10-CM

## 2024-05-06 DIAGNOSIS — J439 Emphysema, unspecified: Secondary | ICD-10-CM | POA: Diagnosis not present

## 2024-05-06 DIAGNOSIS — Z886 Allergy status to analgesic agent status: Secondary | ICD-10-CM

## 2024-05-06 DIAGNOSIS — Z9981 Dependence on supplemental oxygen: Secondary | ICD-10-CM | POA: Diagnosis not present

## 2024-05-06 DIAGNOSIS — Z888 Allergy status to other drugs, medicaments and biological substances status: Secondary | ICD-10-CM

## 2024-05-06 DIAGNOSIS — I429 Cardiomyopathy, unspecified: Secondary | ICD-10-CM | POA: Diagnosis not present

## 2024-05-06 DIAGNOSIS — Z79899 Other long term (current) drug therapy: Secondary | ICD-10-CM

## 2024-05-06 DIAGNOSIS — R1310 Dysphagia, unspecified: Secondary | ICD-10-CM

## 2024-05-06 LAB — CBC
HCT: 30.8 % — ABNORMAL LOW (ref 36.0–46.0)
Hemoglobin: 9.3 g/dL — ABNORMAL LOW (ref 12.0–15.0)
MCH: 32.9 pg (ref 26.0–34.0)
MCHC: 30.2 g/dL (ref 30.0–36.0)
MCV: 108.8 fL — ABNORMAL HIGH (ref 80.0–100.0)
Platelets: 392 K/uL (ref 150–400)
RBC: 2.83 MIL/uL — ABNORMAL LOW (ref 3.87–5.11)
RDW: 12.7 % (ref 11.5–15.5)
WBC: 10.6 K/uL — ABNORMAL HIGH (ref 4.0–10.5)
nRBC: 0 % (ref 0.0–0.2)

## 2024-05-06 LAB — TROPONIN I (HIGH SENSITIVITY)
Troponin I (High Sensitivity): 8 ng/L (ref <18)
Troponin I (High Sensitivity): 8 ng/L (ref <18)

## 2024-05-06 LAB — BASIC METABOLIC PANEL WITH GFR
Anion gap: 12 (ref 5–15)
BUN: 9 mg/dL (ref 8–23)
CO2: 31 mmol/L (ref 22–32)
Calcium: 8.7 mg/dL — ABNORMAL LOW (ref 8.9–10.3)
Chloride: 97 mmol/L — ABNORMAL LOW (ref 98–111)
Creatinine, Ser: 1.06 mg/dL — ABNORMAL HIGH (ref 0.44–1.00)
GFR, Estimated: 55 mL/min — ABNORMAL LOW (ref >60)
Glucose, Bld: 151 mg/dL — ABNORMAL HIGH (ref 70–99)
Potassium: 3.8 mmol/L (ref 3.5–5.1)
Sodium: 140 mmol/L (ref 135–145)

## 2024-05-06 LAB — RETICULOCYTES
Immature Retic Fract: 25.2 % — ABNORMAL HIGH (ref 2.3–15.9)
RBC.: 2.81 MIL/uL — ABNORMAL LOW (ref 3.87–5.11)
Retic Count, Absolute: 63.5 K/uL (ref 19.0–186.0)
Retic Ct Pct: 2.3 % (ref 0.4–3.1)

## 2024-05-06 LAB — IRON AND TIBC
Iron: 40 ug/dL (ref 28–170)
Saturation Ratios: 18 % (ref 10.4–31.8)
TIBC: 221 ug/dL — ABNORMAL LOW (ref 250–450)
UIBC: 181 ug/dL

## 2024-05-06 LAB — FERRITIN: Ferritin: 210 ng/mL (ref 11–307)

## 2024-05-06 LAB — VITAMIN B12: Vitamin B-12: 764 pg/mL (ref 180–914)

## 2024-05-06 LAB — FOLATE: Folate: 35.5 ng/mL (ref >5.9)

## 2024-05-06 MED ORDER — MIRTAZAPINE 15 MG PO TABS
7.5000 mg | ORAL_TABLET | Freq: Every day | ORAL | Status: DC
  Administered 2024-05-06 – 2024-05-09 (×4): 7.5 mg via ORAL
  Filled 2024-05-06 (×4): qty 1

## 2024-05-06 MED ORDER — ALUM & MAG HYDROXIDE-SIMETH 200-200-20 MG/5ML PO SUSP
30.0000 mL | Freq: Once | ORAL | Status: AC
  Administered 2024-05-06: 30 mL via ORAL
  Filled 2024-05-06: qty 30

## 2024-05-06 MED ORDER — ONDANSETRON HCL 4 MG/2ML IJ SOLN
4.0000 mg | Freq: Four times a day (QID) | INTRAMUSCULAR | Status: DC | PRN
  Administered 2024-05-07: 4 mg via INTRAVENOUS
  Filled 2024-05-06: qty 2

## 2024-05-06 MED ORDER — FOLIC ACID 1 MG PO TABS
1.0000 mg | ORAL_TABLET | Freq: Every day | ORAL | Status: DC
  Administered 2024-05-06 – 2024-05-11 (×6): 1 mg via ORAL
  Filled 2024-05-06 (×6): qty 1

## 2024-05-06 MED ORDER — METOPROLOL SUCCINATE ER 50 MG PO TB24: 50.0000 mg | ORAL_TABLET | Freq: Every day | ORAL | Status: DC

## 2024-05-06 MED ORDER — BUDESON-GLYCOPYRROL-FORMOTEROL 160-9-4.8 MCG/ACT IN AERO
2.0000 | INHALATION_SPRAY | Freq: Two times a day (BID) | RESPIRATORY_TRACT | Status: DC
  Administered 2024-05-06 – 2024-05-10 (×8): 2 via RESPIRATORY_TRACT
  Filled 2024-05-06: qty 5.9

## 2024-05-06 MED ORDER — ACETAMINOPHEN 650 MG RE SUPP: 650.0000 mg | Freq: Four times a day (QID) | RECTAL | Status: DC

## 2024-05-06 MED ORDER — OXYCODONE HCL 5 MG PO TABS
5.0000 mg | ORAL_TABLET | Freq: Four times a day (QID) | ORAL | Status: DC | PRN
  Administered 2024-05-06 – 2024-05-09 (×6): 5 mg via ORAL
  Filled 2024-05-06 (×6): qty 1

## 2024-05-06 MED ORDER — ACETAMINOPHEN 325 MG PO TABS
650.0000 mg | ORAL_TABLET | Freq: Four times a day (QID) | ORAL | Status: DC
  Administered 2024-05-06: 650 mg via ORAL
  Filled 2024-05-06 (×2): qty 2

## 2024-05-06 MED ORDER — TRAZODONE HCL 50 MG PO TABS
25.0000 mg | ORAL_TABLET | Freq: Every evening | ORAL | Status: DC | PRN
Start: 2024-05-06 — End: 2024-05-10
  Administered 2024-05-06 – 2024-05-09 (×3): 25 mg via ORAL
  Filled 2024-05-06 (×4): qty 1

## 2024-05-06 MED ORDER — METHYLPREDNISOLONE SODIUM SUCC 40 MG IJ SOLR
40.0000 mg | Freq: Once | INTRAMUSCULAR | Status: AC
  Administered 2024-05-06: 40 mg via INTRAVENOUS
  Filled 2024-05-06: qty 1

## 2024-05-06 MED ORDER — FENTANYL CITRATE PF 50 MCG/ML IJ SOSY
12.5000 ug | PREFILLED_SYRINGE | INTRAMUSCULAR | Status: DC | PRN
  Administered 2024-05-06: 12.5 ug via INTRAVENOUS
  Filled 2024-05-06: qty 1

## 2024-05-06 MED ORDER — FENTANYL CITRATE (PF) 100 MCG/2ML IJ SOLN: 12.5000 ug | INTRAMUSCULAR | Status: DC | PRN

## 2024-05-06 MED ORDER — SODIUM CHLORIDE 0.9 % IV SOLN
1.0000 g | INTRAVENOUS | Status: DC
  Administered 2024-05-06 – 2024-05-07 (×2): 1 g via INTRAVENOUS
  Filled 2024-05-06 (×2): qty 10

## 2024-05-06 MED ORDER — IPRATROPIUM-ALBUTEROL 0.5-2.5 (3) MG/3ML IN SOLN
3.0000 mL | Freq: Once | RESPIRATORY_TRACT | Status: AC
  Administered 2024-05-06: 3 mL via RESPIRATORY_TRACT
  Filled 2024-05-06: qty 3

## 2024-05-06 MED ORDER — ALBUTEROL SULFATE (2.5 MG/3ML) 0.083% IN NEBU: 10.0000 mg | INHALATION_SOLUTION | RESPIRATORY_TRACT | Status: AC

## 2024-05-06 MED ORDER — BISACODYL 5 MG PO TBEC: 5.0000 mg | DELAYED_RELEASE_TABLET | Freq: Every day | ORAL | Status: DC | PRN

## 2024-05-06 MED ORDER — MONTELUKAST SODIUM 10 MG PO TABS
10.0000 mg | ORAL_TABLET | Freq: Every day | ORAL | Status: DC
  Administered 2024-05-06 – 2024-05-10 (×5): 10 mg via ORAL
  Filled 2024-05-06 (×5): qty 1

## 2024-05-06 MED ORDER — METOPROLOL SUCCINATE ER 25 MG PO TB24
25.0000 mg | ORAL_TABLET | Freq: Every day | ORAL | Status: DC
Start: 2024-05-06 — End: 2024-05-09
  Administered 2024-05-06 – 2024-05-09 (×4): 25 mg via ORAL
  Filled 2024-05-06 (×4): qty 1

## 2024-05-06 MED ORDER — ONDANSETRON HCL 4 MG PO TABS: 4.0000 mg | ORAL_TABLET | Freq: Four times a day (QID) | ORAL | Status: DC | PRN

## 2024-05-06 MED ORDER — HEPARIN SODIUM (PORCINE) 5000 UNIT/ML IJ SOLN
5000.0000 [IU] | Freq: Three times a day (TID) | INTRAMUSCULAR | Status: DC
  Administered 2024-05-06 – 2024-05-11 (×14): 5000 [IU] via SUBCUTANEOUS
  Filled 2024-05-06 (×14): qty 1

## 2024-05-06 MED ORDER — FAMOTIDINE IN NACL 20-0.9 MG/50ML-% IV SOLN
10.0000 mg | Freq: Two times a day (BID) | INTRAVENOUS | Status: DC
  Administered 2024-05-06 – 2024-05-07 (×3): 10 mg via INTRAVENOUS
  Administered 2024-05-08 – 2024-05-09 (×4): 20 mg via INTRAVENOUS
  Administered 2024-05-10: 10 mg via INTRAVENOUS
  Filled 2024-05-06 (×8): qty 50

## 2024-05-06 MED ORDER — CLOPIDOGREL BISULFATE 75 MG PO TABS
75.0000 mg | ORAL_TABLET | Freq: Every day | ORAL | Status: DC
  Administered 2024-05-06 – 2024-05-11 (×6): 75 mg via ORAL
  Filled 2024-05-06 (×6): qty 1

## 2024-05-06 MED ORDER — DOXYCYCLINE HYCLATE 100 MG PO TABS
100.0000 mg | ORAL_TABLET | Freq: Once | ORAL | Status: AC
  Administered 2024-05-06: 100 mg via ORAL
  Filled 2024-05-06: qty 1

## 2024-05-06 MED ORDER — DEXTROMETHORPHAN POLISTIREX ER 30 MG/5ML PO SUER: 30.0000 mg | Freq: Two times a day (BID) | ORAL | Status: DC | PRN

## 2024-05-06 MED ORDER — ROSUVASTATIN CALCIUM 20 MG PO TABS
40.0000 mg | ORAL_TABLET | Freq: Every day | ORAL | Status: DC
  Administered 2024-05-07: 40 mg via ORAL
  Filled 2024-05-06 (×2): qty 2

## 2024-05-06 MED ORDER — DOXYCYCLINE HYCLATE 100 MG PO TABS
100.0000 mg | ORAL_TABLET | Freq: Two times a day (BID) | ORAL | Status: DC
  Administered 2024-05-06 – 2024-05-08 (×4): 100 mg via ORAL
  Filled 2024-05-06 (×4): qty 1

## 2024-05-06 MED ORDER — MONTELUKAST SODIUM 10 MG PO TABS: 10.0000 mg | ORAL_TABLET | Freq: Every day | ORAL | Status: DC

## 2024-05-06 MED ORDER — MIRTAZAPINE 15 MG PO TABS: 7.5000 mg | ORAL_TABLET | Freq: Every day | ORAL | Status: DC

## 2024-05-06 MED ORDER — IPRATROPIUM-ALBUTEROL 0.5-2.5 (3) MG/3ML IN SOLN
3.0000 mL | Freq: Three times a day (TID) | RESPIRATORY_TRACT | Status: DC
  Administered 2024-05-06 – 2024-05-07 (×3): 3 mL via RESPIRATORY_TRACT
  Filled 2024-05-06 (×4): qty 3

## 2024-05-06 MED ORDER — POLYETHYLENE GLYCOL 3350 17 G PO PACK
17.0000 g | PACK | Freq: Every day | ORAL | Status: DC
  Administered 2024-05-07 – 2024-05-11 (×3): 17 g via ORAL
  Filled 2024-05-06 (×6): qty 1

## 2024-05-06 MED ORDER — IPRATROPIUM-ALBUTEROL 0.5-2.5 (3) MG/3ML IN SOLN: 3.0000 mL | Freq: Three times a day (TID) | RESPIRATORY_TRACT | Status: DC

## 2024-05-06 MED ORDER — ASPIRIN 81 MG PO TBEC
81.0000 mg | DELAYED_RELEASE_TABLET | Freq: Every day | ORAL | Status: DC
  Administered 2024-05-07 – 2024-05-11 (×5): 81 mg via ORAL
  Filled 2024-05-06 (×5): qty 1

## 2024-05-06 MED ORDER — CITALOPRAM HYDROBROMIDE 20 MG PO TABS
20.0000 mg | ORAL_TABLET | Freq: Every day | ORAL | Status: DC
  Administered 2024-05-06 – 2024-05-11 (×6): 20 mg via ORAL
  Filled 2024-05-06 (×6): qty 1

## 2024-05-06 MED ORDER — FLUTICASONE PROPIONATE 50 MCG/ACT NA SUSP: 1.0000 | Freq: Every day | NASAL | Status: DC | PRN

## 2024-05-06 MED ORDER — GUAIFENESIN ER 600 MG PO TB12
600.0000 mg | ORAL_TABLET | Freq: Two times a day (BID) | ORAL | Status: DC
  Administered 2024-05-11: 600 mg via ORAL
  Filled 2024-05-06 (×7): qty 1

## 2024-05-06 NOTE — Hospital Course (Signed)
 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  She is having pain in the right upper chest area radiating into the back worse with coughing.  She is also been having fever and chills at home.  She also reports having nonproductive nocturnal coughing.  She has been having some increasing shortness of breath with exertion.  She has increasing oxygen  requirement with exertion noted in the ED to become hypoxic with ambulating and tachycardic.  Her oxygen  was increased to 4 L/min from baseline of 3 L/min with exertion.  Her chest x-ray demonstrated concern for right upper lobe infiltrates.  Her cardiac workup has been reassuring with 2 normal high-sensitivity troponin tests.  Patient being admitted for further management.

## 2024-05-06 NOTE — ED Provider Notes (Signed)
 [place] EMERGENCY DEPARTMENT AT [facility] Provider Note   CSN: [id number] Arrival date & time: 05/06/24  9288     Patient presents with: Chest Pain   [name] is a 73 y.o. female.    Chest Pain    This patient is a 73 year old female who appears older than stated age on chronic oxygen  secondary to COPD.  She has a history of cardiac disease and reports that she has a couple of stents in her heart.  She is followed by local cardiology for essential hypertension, cardiomyopathy and chronic systolic heart failure.  She has had an echocardiogram as recently as April, the medical record reports that this showed normal ejection fraction, indeterminate diastolic function  She reports she has had a heart catheterization in the past with a couple of stents but this record is not available in our electronic medical record.  She presents to the hospital in the care of a family member with a complaint of left-sided chest discomfort that seems to radiate up into her neck and her arm.  She has associated chronic shortness of breath which is not new but denies any swelling of her legs.  This came on about 2:00 in the morning, she states that it is coming and going, she states she cannot remember what her heart attack felt like in the past but feels like it was something like this.  No fevers or chills no nausea or vomiting no diarrhea.    Prior to Admission medications   Medication Sig Start Date End Date Taking? Authorizing Provider  STIOLTO RESPIMAT  2.5-2.5 MCG/ACT AERS SMARTSIG:2 Puff(s) Via Inhaler Daily 04/18/24  Yes [provider]  acetaminophen  (TYLENOL ) 650 MG CR tablet Take 1,300 mg by mouth every 8 (eight) hours as needed for pain.    [provider]  aspirin  EC 81 MG tablet Take 81 mg by mouth daily. Swallow whole.    [provider]  citalopram  (CELEXA ) 20 MG tablet Take 20 mg by mouth daily. 11/01/23   [provider]  cloNIDine   (CATAPRES ) 0.3 MG tablet Take 1 tablet (0.3 mg total) by mouth 2 (two) times daily as needed. 03/30/24   Del Wilhelmena Lloyd Sola, FNP  clopidogrel  (PLAVIX ) 75 MG tablet Take 1 tablet (75 mg total) by mouth daily. 06/17/22   Singh, Prashant K, MD  fluticasone  (FLONASE ) 50 MCG/ACT nasal spray Place 1 spray into both nostrils daily as needed for rhinitis or allergies. 01/04/24   [provider]  Fluticasone -Umeclidin-Vilant (TRELEGY ELLIPTA ) 100-62.5-25 MCG/ACT AEPB Inhale 1 puff into the lungs in the morning. 01/25/24   [name] Ozell NOVAK, MD  folic acid  (FOLVITE ) 1 MG tablet Take 1 tablet (1 mg total) by mouth daily. 10/17/21   Evonnie Lenis, MD  Ipratropium-Albuterol  (COMBIVENT ) 20-100 MCG/ACT AERS respimat Inhale 1 puff into the lungs every 6 (six) hours as needed for wheezing or shortness of breath. 04/25/24   [name] Ozell NOVAK, MD  losartan  (COZAAR ) 25 MG tablet Take 12.5 mg by mouth daily. 10/08/23   [provider]  metoprolol  succinate (TOPROL  XL) 50 MG 24 hr tablet Take 1 tablet (50 mg total) by mouth daily. Take with or immediately following a meal. 03/30/23   Mallipeddi, Vishnu P, MD  mirtazapine  (REMERON ) 7.5 MG tablet Take 7.5 mg by mouth at bedtime. 04/27/22   [provider]  montelukast  (SINGULAIR ) 10 MG tablet Take 10 mg by mouth at bedtime.    [provider]  ondansetron  (ZOFRAN ) 4 MG  tablet Take 1 tablet (4 mg total) by mouth every 8 (eight) hours as needed for nausea or vomiting. 11/24/23   Maree, Pratik D, DO  oxyCODONE -acetaminophen  (PERCOCET) 5-325 MG tablet Take 1 tablet by mouth every 4 (four) hours as needed. 12/29/23   Geroldine Berg, MD  pantoprazole  (PROTONIX ) 40 MG tablet Take 40 mg by mouth daily.    [provider]  rosuvastatin  (CRESTOR ) 40 MG tablet Take 1 tablet (40 mg total) by mouth daily. 04/07/24   Del Orbe Polanco, Iliana, FNP  torsemide  (DEMADEX ) 20 MG tablet Take 40 mg by mouth 2 (two) times daily.    [provider]  traMADol   (ULTRAM ) 50 MG tablet Take 50 mg by mouth 2 (two) times daily as needed for moderate pain (pain score 4-6). 10/21/23   [provider]  umeclidinium-vilanterol (ANORO ELLIPTA ) 62.5-25 MCG/ACT AEPB Inhale 1 puff into the lungs daily. 01/25/24   [name] Ozell NOVAK, MD  vitamin B-12 (CYANOCOBALAMIN ) 500 MCG tablet Take 1 tablet (500 mcg total) by mouth daily. 10/16/21   Evonnie Lenis, MD    Allergies: Aspirin , Bee venom, Contrast media [iodinated contrast media], Lisinopril, Motrin [ibuprofen], Penicillins, and Dilaudid  [hydromorphone  hcl]    Review of Systems  Cardiovascular:  Positive for chest pain.  All other systems reviewed and are negative.   Updated Vital Signs BP (!) 125/58   Pulse 88   Temp 98.2 F (36.8 C) (Oral)   Resp 18   Ht 1.524 m (5')   Wt 54.4 kg   SpO2 100%   BMI 23.44 kg/m   Physical Exam Vitals and nursing note reviewed.  Constitutional:      General: She is not in acute distress.    Appearance: She is well-developed.  HENT:     Head: Normocephalic and atraumatic.     Mouth/Throat:     Pharynx: No oropharyngeal exudate.  Eyes:     General: No scleral icterus.       Right eye: No discharge.        Left eye: No discharge.     Conjunctiva/sclera: Conjunctivae normal.     Pupils: Pupils are equal, round, and reactive to light.  Neck:     Thyroid : No thyromegaly.     Vascular: No JVD.  Cardiovascular:     Rate and Rhythm: Normal rate and regular rhythm.     Heart sounds: Normal heart sounds. No murmur heard.    No friction rub. No gallop.  Pulmonary:     Effort: Pulmonary effort is normal. No respiratory distress.     Breath sounds: Wheezing present. No rales.  Abdominal:     General: Bowel sounds are normal. There is no distension.     Palpations: Abdomen is soft. There is no mass.     Tenderness: There is no abdominal tenderness.  Musculoskeletal:        General: No tenderness. Normal range of motion.     Cervical back: Normal range of motion and  neck supple.     Right lower leg: No edema.     Left lower leg: No edema.  Lymphadenopathy:     Cervical: No cervical adenopathy.  Skin:    General: Skin is warm and dry.     Findings: No erythema or rash.  Neurological:     Mental Status: She is alert.     Coordination: Coordination normal.  Psychiatric:        Behavior: Behavior normal.     (all labs ordered are  listed, but only abnormal results are displayed) Labs Reviewed  BASIC METABOLIC PANEL WITH GFR - Abnormal; Notable for the following components:      Result Value   Chloride 97 (*)    Glucose, Bld 151 (*)    Creatinine, Ser 1.06 (*)    Calcium  8.7 (*)    GFR, Estimated 55 (*)    All other components within normal limits  CBC - Abnormal; Notable for the following components:   WBC 10.6 (*)    RBC 2.83 (*)    Hemoglobin 9.3 (*)    HCT 30.8 (*)    MCV 108.8 (*)    All other components within normal limits  TROPONIN I (HIGH SENSITIVITY)  TROPONIN I (HIGH SENSITIVITY)    EKG: EKG Interpretation Date/Time:  Saturday May 06 2024 07:24:27 EDT Ventricular Rate:  101 PR Interval:  131 QRS Duration:  92 QT Interval:  355 QTC Calculation: 461 R Axis:   86  Text Interpretation: Sinus tachycardia Borderline right axis deviation Low voltage, precordial leads Confirmed by Cleotilde Rogue ([id number]) on 05/06/2024 7:28:44 AM  Radiology: [facility] Chest Port 1 View Result Date: 05/06/2024 EXAM: 1 VIEW XRAY OF THE CHEST 05/06/2024 07:47:00 AM COMPARISON: 01/10/2024 CLINICAL HISTORY: Cough, CP. Per chart: Pt c/o left-sided CP radiating to neck since earlier this morning. Pt reports sweating also. Rated 8/10. 2 prior stents. 3L O2 chronic; O2 saturation mid-80s on 3L. O2 saturation increased to 97% on 4L nasal cannula. FINDINGS: LUNGS AND PLEURA: New peripheral opacities within the right upper lobe may represent atelectasis or airspace disease. No pleural effusion or pneumothorax. Advanced changes of COPD/emphysema. HEART AND MEDIASTINUM:  No acute abnormality of the cardiac and mediastinal silhouettes. Aortic atherosclerotic calcification. BONES AND SOFT TISSUES: No acute osseous abnormality. IMPRESSION: 1. New peripheral opacities within the right upper lobe, possibly representing atelectasis or airspace disease. 2. Advanced changes of COPD/emphysema. 3. Aortic atherosclerotic calcification. Electronically signed by: Waddell Calk MD 05/06/2024 07:53 AM EDT RP Workstation: [id number]     .Critical Care  Performed by: Cleotilde Rogue, MD Authorized by: Cleotilde Rogue, MD   Critical care provider statement:    Critical care time (minutes):  45   Critical care time was exclusive of:  Separately billable procedures and treating other patients   Critical care was necessary to treat or prevent imminent or life-threatening deterioration of the following conditions:  Respiratory failure   Critical care was time spent personally by me on the following activities:  Development of treatment plan with patient or surrogate, discussions with consultants, evaluation of patient's response to treatment, examination of patient, obtaining history from patient or surrogate, review of old charts, re-evaluation of patient's condition, pulse oximetry, ordering and review of radiographic studies, ordering and review of laboratory studies and ordering and performing treatments and interventions   Care discussed with: admitting provider      Medications Ordered in the ED  ipratropium-albuterol  (DUONEB) 0.5-2.5 (3) MG/3ML nebulizer solution 3 mL (has no administration in time range)  albuterol  (PROVENTIL ) (2.5 MG/3ML) 0.083% nebulizer solution 10 mg (has no administration in time range)  doxycycline  (VIBRA -TABS) tablet 100 mg (100 mg Oral Given 05/06/24 0838)    Clinical Course as of 05/06/24 1251  Sat May 06, 2024  0753 Is at baseline, no significant leukocytosis [BM]  0827 Upon further questioning the patient states that her chest pain does seem to get  a little bit worse when she takes of breath and when she coughs.  She does have a borderline  tachycardia and with some abnormal findings on x-ray we treat for possible pneumonia while awaiting the second troponin [BM]  1242 After treatments this patient ambulated to the bathroom and decompensated with an oxygen  of 70% becoming severely dyspneic.  Her lab work was although unremarkable x-ray was consistent with having a possible infiltrate in the right lung.  Oxygen  had to be increased, heart rate went to 130 and the patient's oxygen  dropped to 70%, improved on 4 L by nasal cannula back up to 92 to 93% [BM]    Clinical Course User Index [BM] Cleotilde Rogue, MD                                 Medical Decision Making Amount and/or Complexity of Data Reviewed Labs: ordered. Radiology: ordered.  Risk Prescription drug management.    This patient presents to the ED for concern of chest pain, this involves an extensive number of treatment options, and is a complaint that carries with it a high risk of complications and morbidity.  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  The pertinent results include: CBC without significant leukocytosis, chronic anemia   Imaging Studies ordered:  I ordered imaging studies including portable chest x-ray I independently visualized and interpreted imaging which showed Early infiltrate in the right middle and upper lobe, I compared this to a prior radiograph from January 10, 2024 at which time  these infiltrates were not seen. I agree with the radiologist interpretation   Cardiac Monitoring: / EKG:  The patient was maintained on a cardiac monitor.  I personally viewed and interpreted the cardiac monitored which showed an underlying rhythm of: Sinus tachycardia, the EKG from today shows no significant T wave inversions as there was back in April.  It does have low voltage and mild sinus tachycardia but no ST elevation   Problem List / ED Course / Critical interventions / Medication management  This patient had shortness of breath in fact became severely dyspneic just tried to get to the bedside commode, heart rate went up to 130 and oxygen  dropped to 70% I ordered medication including continuous nebulizers, furosemide , antibiotics Reevaluation of the patient after these medicines showed that the patient had improvement but needing increasing oxygen  I have reviewed the patients home medicines and have made adjustments as needed   Consultations Obtained:  I requested consultation with the hospitalist Dr. Vicci,  and discussed lab and imaging findings as well as pertinent plan - they recommend: Admission to the hospital   Social Determinants of Health:  Oxygen  dependent respiratory failure   Test / Admission - Considered:  Admit to high level of care Respiratory failure      Final diagnoses:  Acute respiratory failure with hypoxia [facility])    ED Discharge Orders     None          Cleotilde Rogue, MD 05/06/24 1251

## 2024-05-06 NOTE — ED Triage Notes (Addendum)
 Pt c/o left-sided CP radiating to neck since earlier this morning. Pt reports sweating also. Rated 8/10. 2 prior stents. 3L O2 chronic; O2 saturation mid-80s on 3L. O2 saturation increased to 97% on 4L nasal cannula.

## 2024-05-06 NOTE — H&P (Signed)
 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  She is having pain in the right upper chest area radiating into the back worse with coughing.  She is also been having fever and chills at home.  She also reports having nonproductive nocturnal coughing.  She has been having some increasing shortness of breath with exertion.  She has increasing oxygen  requirement with exertion noted in the ED to become hypoxic with ambulating and tachycardic.  Her oxygen  was increased to 4 L/min from baseline of 3 L/min with exertion.  Her chest x-ray demonstrated concern for right upper lobe infiltrates.  Her cardiac workup has been reassuring with 2 normal high-sensitivity troponin tests.  Patient being admitted for further management.   Past Medical History:  Diagnosis Date   Anxiety    Blind    right   Colitis 09/07/2011   CVA (cerebral infarction)    GERD (gastroesophageal reflux disease)    Hyperlipidemia    Hypertension    Myocardial infarct (HCC)    1997, 2004   Severe major depression with psychotic features (HCC)    2004   Stroke [facility]) 2002    Past Surgical History:  Procedure Laterality Date   ABDOMINAL HYSTERECTOMY     BILATERAL SALPINGOOPHORECTOMY     BIOPSY  05/08/2022   Procedure: BIOPSY;  Surgeon: Eartha Angelia Sieving, MD;  [facility];  Service: Gastroenterology;;   CHOLECYSTECTOMY N/A 10/15/2021   Procedure: LAPAROSCOPIC CHOLECYSTECTOMY WITH INTRAOPERATIVE CHOLANGIOGRAM;  Surgeon: Mavis Anes, MD;  Location: [facility];  Service: General;  Laterality: N/A;   COLONOSCOPY N/A 07/17/2014   RMR: Extremely redundant colon. Colonic diverticulosis. Inadequate prepartation precluded examination of all the rectal and colonic.  I suspect slow colonic transit in the setting of a markely redundant colon.   CORONARY ANGIOPLASTY WITH STENT PLACEMENT  [id number]   cyst removed from left wrist     ERCP N/A 10/10/2021   Procedure: ENDOSCOPIC RETROGRADE CHOLANGIOPANCREATOGRAPHY (ERCP);  Surgeon: Golda Claudis PENNER, MD;  Location: [facility];  Service: Gastroenterology;  Laterality: N/A;   ERCP N/A 10/13/2021   Procedure: ENDOSCOPIC RETROGRADE CHOLANGIOPANCREATOGRAPHY (ERCP);  Surgeon: Golda Claudis PENNER, MD;  Location: [facility];  Service: Gastroenterology;  Laterality: N/A;   ESOPHAGEAL DILATION  05/08/2022   Procedure: ESOPHAGEAL DILATION;  Surgeon: Eartha Angelia, Sieving, MD;  Location: [facility];  Service: Gastroenterology;;  savory dilation'   ESOPHAGOGASTRODUODENOSCOPY  09/05/2004   Dr Vilma gastritis, candida esophagitis   ESOPHAGOGASTRODUODENOSCOPY  09/07/2011   DOQ:Mpwh, esophageal in the distal esophagus/Mild gastritis   ESOPHAGOGASTRODUODENOSCOPY (EGD) WITH PROPOFOL  N/A 05/08/2022   Procedure: ESOPHAGOGASTRODUODENOSCOPY (EGD) WITH PROPOFOL ;  Surgeon: Eartha Angelia Sieving, MD;  Location[facility];  Service: Gastroenterology;  Laterality: N/A;  with possible esophageal dilation   Lumps removed from each breast     SPHINCTEROTOMY N/A 10/10/2021   Procedure: SPHINCTEROTOMY INCOMPLETE, STONE NOT REMOVED;  Surgeon: Golda Claudis PENNER, MD;  Location: [facility];  Service: Gastroenterology;  Laterality: N/A;     reports that she quit smoking about 6 years ago. Her smoking use included cigarettes. She started smoking about 46 years  ago. She has a 10 pack-year smoking history. She has never used smokeless tobacco. She reports current drug use. Drug: Marijuana. She reports that she does not drink alcohol .  Allergies  Allergen Reactions   Aspirin  Swelling and Other (See Comments)    Only in large doses will cause a reaction   Bee Venom Swelling    Severe swelling   Contrast Media [Iodinated Contrast Media] Swelling   Lisinopril Swelling   Motrin [Ibuprofen] Swelling   Penicillins Anaphylaxis    Tolerated Rocephin  Jan 2025   Dilaudid  [Hydromorphone  Hcl] Itching    Family History  Problem Relation Age of Onset   Multiple sclerosis Father    Alzheimer's disease Mother    Hypertension Mother    Diabetes Mother    Colon cancer Neg Hx     Prior to Admission medications   Medication Sig Start Date End Date Taking? Authorizing Provider  aspirin  EC 81 MG tablet Take 81 mg by mouth daily. Swallow whole.   Yes [provider]  citalopram  (CELEXA ) 20 MG tablet Take 20 mg by mouth daily. 11/01/23  Yes [provider]  clopidogrel  (PLAVIX ) 75 MG tablet Take 1 tablet (75 mg total) by mouth daily. 06/17/22  Yes Singh, Prashant K, MD  folic acid  (FOLVITE ) 1 MG tablet Take 1 tablet (1 mg total) by mouth daily. 10/17/21  Yes Tat, Alm, MD  losartan  (COZAAR ) 25 MG tablet Take 12.5 mg by mouth daily. 10/08/23  Yes [provider]  metoprolol  succinate (TOPROL  XL) 50 MG 24 hr tablet Take 1 tablet (50 mg total) by mouth daily. Take with or immediately following a meal. 03/30/23  Yes Mallipeddi, Vishnu P, MD  mirtazapine  (REMERON ) 7.5 MG tablet Take 7.5 mg by mouth at bedtime. 04/27/22  Yes [provider]  montelukast  (SINGULAIR ) 10 MG tablet Take 10 mg by mouth at bedtime.   Yes [provider]  ondansetron  (ZOFRAN ) 4 MG tablet Take 1 tablet (4 mg total) by mouth every 8 (eight) hours as needed for nausea or vomiting. 11/24/23  Yes Shah, Pratik D, DO  pantoprazole  (PROTONIX ) 40 MG tablet Take 40  mg by mouth daily.   Yes [provider]  rosuvastatin  (CRESTOR ) 40 MG tablet Take 1 tablet (40 mg total) by mouth daily. 04/07/24  Yes Del Wilhelmena Falter, Hilario, FNP  STIOLTO RESPIMAT  2.5-2.5 MCG/ACT AERS Inhale 2 sprays into the lungs daily. 04/18/24  Yes [provider]  torsemide  (DEMADEX ) 20 MG tablet Take 40 mg by mouth 2 (two) times daily.   Yes [provider]  acetaminophen  (TYLENOL ) 650 MG CR tablet Take 1,300 mg by mouth every 8 (eight) hours as needed for pain.    [provider]  cloNIDine  (CATAPRES ) 0.3 MG tablet Take 1 tablet (0.3 mg total) by mouth 2 (two) times daily as needed. 03/30/24   Del Orbe Polanco, Iliana, FNP  fluticasone  (FLONASE ) 50 MCG/ACT nasal spray Place 1 spray into both nostrils daily as needed for rhinitis or allergies. 01/04/24   [provider]  Fluticasone -Umeclidin-Vilant (TRELEGY ELLIPTA ) 100-62.5-25 MCG/ACT AEPB Inhale 1 puff into the lungs in the morning. 01/25/24   [name] Ozell NOVAK, MD  Ipratropium-Albuterol  (COMBIVENT ) 20-100 MCG/ACT AERS respimat Inhale 1 puff into the lungs every 6 (six) hours as needed for wheezing or shortness of  breath. 04/25/24   [name] Ozell NOVAK, MD  oxyCODONE -acetaminophen  (PERCOCET) 5-325 MG tablet Take 1 tablet by mouth every 4 (four) hours as needed. 12/29/23   Geroldine Berg, MD  traMADol  (ULTRAM ) 50 MG tablet Take 50 mg by mouth 2 (two) times daily as needed for moderate pain (pain score 4-6). 10/21/23   [provider]  umeclidinium-vilanterol (ANORO ELLIPTA ) 62.5-25 MCG/ACT AEPB Inhale 1 puff into the lungs daily. 01/25/24   [name] Ozell NOVAK, MD  vitamin B-12 (CYANOCOBALAMIN ) 500 MCG tablet Take 1 tablet (500 mcg total) by mouth daily. 10/16/21   Evonnie Lenis, MD    Physical Exam: Vitals:   05/06/24 0719 05/06/24 0725 05/06/24 1035 05/06/24 1116  BP:  (!) 141/68 (!) 125/58   Pulse:  (!) 102 88   Resp:  19 18   Temp:  98.6 F (37 C)  98.2 F (36.8 C)  TempSrc:  Oral  Oral  SpO2:  98%  100%   Weight: 54.4 kg     Height: 5' (1.524 m)       Constitutional: NAD, calm, comfortable Eyes: PERRL, lids and conjunctivae normal ENMT: Mucous membranes are moist. Posterior pharynx clear of any exudate or lesions.Normal dentition.  Neck: normal, supple, no masses, no thyromegaly Respiratory: diminished BS RUL with rales heard RUL, bilateral expiratory wheezing heard.  Cardiovascular: normal s1, s2 sounds, no murmurs / rubs / gallops. No extremity edema. 2+ pedal pulses. No carotid bruits.  Abdomen: no tenderness, no masses palpated. No hepatosplenomegaly. Bowel sounds positive.  Musculoskeletal: no clubbing / cyanosis. No joint deformity upper and lower extremities. Good ROM, no contractures. Normal muscle tone.  Skin: no rashes, lesions, ulcers. No induration Neurologic: CN 2-12 grossly intact. Sensation intact, DTR normal. Strength 5/5 in all 4.  Psychiatric: Normal judgment and insight. Alert and oriented x 3. Normal mood.   Labs on Admission: I have personally reviewed following labs and imaging studies  CBC: Recent Labs  Lab 05/06/24 0733  WBC 10.6*  HGB 9.3*  HCT 30.8*  MCV 108.8*  PLT 392   Basic Metabolic Panel: Recent Labs  Lab 05/06/24 0733  NA 140  K 3.8  CL 97*  CO2 31  GLUCOSE 151*  BUN 9  CREATININE 1.06*  CALCIUM  8.7*   GFR: Estimated Creatinine Clearance: 33.4 mL/min (A) (by C-G formula based on SCr of 1.06 mg/dL (H)). Liver Function Tests: No results for input(s): AST, ALT, ALKPHOS, BILITOT, PROT, ALBUMIN in the last 168 hours. No results for input(s): LIPASE, AMYLASE in the last 168 hours. No results for input(s): AMMONIA in the last 168 hours. Coagulation Profile: No results for input(s): INR, PROTIME in the last 168 hours. Cardiac Enzymes: No results for input(s): CKTOTAL, CKMB, CKMBINDEX, TROPONINI in the last 168 hours. BNP (last 3 results) No results for input(s): PROBNP in the last 8760  hours. HbA1C: No results for input(s): HGBA1C in the last 72 hours. CBG: No results for input(s): GLUCAP in the last 168 hours. Lipid Profile: No results for input(s): CHOL, HDL, LDLCALC, TRIG, CHOLHDL, LDLDIRECT in the last 72 hours. Thyroid  Function Tests: No results for input(s): TSH, T4TOTAL, FREET4, T3FREE, THYROIDAB in the last 72 hours. Anemia Panel: No results for input(s): VITAMINB12, FOLATE, FERRITIN, TIBC, IRON, RETICCTPCT in the last 72 hours. Urine analysis:    Component Value Date/Time   COLORURINE YELLOW 01/07/2024 0130   APPEARANCEUR HAZY (A) 01/07/2024 0130   LABSPEC 1.013 01/07/2024 0130   PHURINE 5.0 01/07/2024 0130   GLUCOSEU NEGATIVE 01/07/2024 0130  HGBUR NEGATIVE 01/07/2024 0130   BILIRUBINUR NEGATIVE 01/07/2024 0130   KETONESUR NEGATIVE 01/07/2024 0130   PROTEINUR 30 (A) 01/07/2024 0130   UROBILINOGEN 1.0 07/16/2014 0244   NITRITE NEGATIVE 01/07/2024 0130   LEUKOCYTESUR NEGATIVE 01/07/2024 0130    Radiological Exams on Admission: DG Chest Port 1 View Result Date: 05/06/2024 EXAM: 1 VIEW XRAY OF THE CHEST 05/06/2024 07:47:00 AM COMPARISON: 01/10/2024 CLINICAL HISTORY: Cough, CP. Per chart: Pt c/o left-sided CP radiating to neck since earlier this morning. Pt reports sweating also. Rated 8/10. 2 prior stents. 3L O2 chronic; O2 saturation mid-80s on 3L. O2 saturation increased to 97% on 4L nasal cannula. FINDINGS: LUNGS AND PLEURA: New peripheral opacities within the right upper lobe may represent atelectasis or airspace disease. No pleural effusion or pneumothorax. Advanced changes of COPD/emphysema. HEART AND MEDIASTINUM: No acute abnormality of the cardiac and mediastinal silhouettes. Aortic atherosclerotic calcification. BONES AND SOFT TISSUES: No acute osseous abnormality. IMPRESSION: 1. New peripheral opacities within the right upper lobe, possibly representing atelectasis or airspace disease. 2. Advanced changes of  COPD/emphysema. 3. Aortic atherosclerotic calcification. Electronically signed by: Waddell Calk MD 05/06/2024 07:53 AM EDT RP Workstation: [id number]    EKG: Independently reviewed. No acute ST-T wave findings, sinus tachycardia  Assessment/Plan Principal Problem:   Acute and chronic respiratory failure with hypoxia (HCC) Active Problems:   Essential hypertension   History of stroke   Functional constipation   Dysphagia   Gastroesophageal reflux disease   Macrocytic anemia   Protein-calorie malnutrition, severe   COPD GOLD ?   Former smoker   CAD (coronary artery disease)   Heart failure with improved ejection fraction (HFimpEF) (HCC)   Long term (current) use of antithrombotics/antiplatelets   Leukocytosis   CAP (community acquired pneumonia)    Acute on chronic respiratory failure with hypoxia -Most likely secondary to right upper lobe pneumonia -Continue supportive measures and pneumonia treatment as ordered -Increased supplemental oxygen  to 4 L/min with exertion -Hopefully can wean down to 3 L/min with treatments -IV Solu-Medrol  x 1 dose ordered -Bronchodilators ordered -Continue antibiotics as ordered  Right upper lobe pneumonia -Patient presents with leukocytosis, fever and chills, cough, chest x-ray findings -IV antibiotics ordered ceftriaxone  and doxycycline  -Mucinex  600 mg twice daily and Delsym  as needed cough symptoms -Pulmonary toilet -Bronchodilators ordered  Chest pain, atypical -ACS ruled out with 2 normal high-sensitivity troponin tests -Likely secondary to pneumonia and GI -GI cocktail ordered -Treating pneumonia as noted above -IV famotidine  ordered -Treat supportively -Schedule acetaminophen  given pleuritic nature of pain  Leukocytosis -Secondary to pneumonia -Anticipate leukemoid reaction from steroids -Following  Macrocytic anemia -Resume home daily folic acid  -Follow-up anemia panel testing results  GERD -Treating aggressively with IV  famotidine  twice daily  Chronic HFimpEF -Continue home meds -Appears compensated -Follow clinically  DVT prophylaxis: sq heparin    Code Status: Full   Family Communication: son bedside   Disposition Plan: anticipate home   Consults called:   Admission status: INP Time spent: 62 mins   Level of care: Telemetry Afton Louder MD [facility] How to contact the [facility] Attending or Consulting provider 7A - 7P or covering provider during after hours 7P -7A, for this patient?  Check the care team in [facility] and look for a) attending/consulting [facility] provider listed and b) the [facility] team listed Log into [email or web address] and use [facility] universal password to access. If you do not have the password, please contact the hospital operator. Locate the [facility] provider you are looking for under [facility]  and page to a number that you can be directly reached. If you still have difficulty reaching the provider, please page the [facility] (Director on Call) for the Hospitalists listed on amion for assistance.   If 7PM-7AM, please contact night-coverage [email or web address] Password [email or web address]  05/06/2024, 1:05 PM

## 2024-05-06 NOTE — Progress Notes (Signed)
   05/06/24 1811  Assess: MEWS Score  Temp 98.3 F (36.8 C)  BP (!) 124/56  MAP (mmHg) 77  Pulse Rate (!) 106  SpO2 98 %  O2 Device Nasal Cannula  O2 Flow Rate (L/min) 4 L/min  Assess: MEWS Score  MEWS Temp 0  MEWS Systolic 0  MEWS Pulse 1  MEWS RR 1  MEWS LOC 0  MEWS Score 2  MEWS Score Color Yellow  Assess: if the MEWS score is Yellow or Red  Were vital signs accurate and taken at a resting state? Yes  Does the patient meet 2 or more of the SIRS criteria? Yes  Does the patient have a confirmed or suspected source of infection? No  MEWS guidelines implemented  Yes, yellow  Treat  MEWS Interventions Considered administering scheduled or prn medications/treatments as ordered  Take Vital Signs  Increase Vital Sign Frequency  Yellow: Q2hr x1, continue Q4hrs until patient remains green for 12hrs  Escalate  MEWS: Escalate Yellow: Discuss with charge nurse and consider notifying provider and/or RRT  Notify: Charge Nurse/RN  Name of Charge Nurse/RN Notified Connell Durand  Provider Notification  Provider Name/Title Dr. Vicci  Date Provider Notified 05/06/24  Time Provider Notified 1834  Method of Notification Page  Notification Reason Change in status  Provider response Other (Comment) (awaiting)  Assess: SIRS CRITERIA  SIRS Temperature  0  SIRS Respirations  1  SIRS Pulse 1  SIRS WBC 0  SIRS Score Sum  2

## 2024-05-06 NOTE — Plan of Care (Signed)

## 2024-05-07 DIAGNOSIS — J189 Pneumonia, unspecified organism: Secondary | ICD-10-CM | POA: Diagnosis not present

## 2024-05-07 DIAGNOSIS — I5032 Chronic diastolic (congestive) heart failure: Secondary | ICD-10-CM | POA: Diagnosis not present

## 2024-05-07 DIAGNOSIS — I2510 Atherosclerotic heart disease of native coronary artery without angina pectoris: Secondary | ICD-10-CM | POA: Diagnosis not present

## 2024-05-07 DIAGNOSIS — J9621 Acute and chronic respiratory failure with hypoxia: Secondary | ICD-10-CM | POA: Diagnosis not present

## 2024-05-07 DIAGNOSIS — D72829 Elevated white blood cell count, unspecified: Secondary | ICD-10-CM

## 2024-05-07 LAB — CBC WITH DIFFERENTIAL/PLATELET
Abs Immature Granulocytes: 0.04 K/uL (ref 0.00–0.07)
Basophils Absolute: 0 K/uL (ref 0.0–0.1)
Basophils Relative: 0 %
Eosinophils Absolute: 0 K/uL (ref 0.0–0.5)
Eosinophils Relative: 0 %
HCT: 30.9 % — ABNORMAL LOW (ref 36.0–46.0)
Hemoglobin: 9.5 g/dL — ABNORMAL LOW (ref 12.0–15.0)
Immature Granulocytes: 1 %
Lymphocytes Relative: 14 %
Lymphs Abs: 1 K/uL (ref 0.7–4.0)
MCH: 33.1 pg (ref 26.0–34.0)
MCHC: 30.7 g/dL (ref 30.0–36.0)
MCV: 107.7 fL — ABNORMAL HIGH (ref 80.0–100.0)
Monocytes Absolute: 0.1 K/uL (ref 0.1–1.0)
Monocytes Relative: 2 %
Neutro Abs: 6.3 K/uL (ref 1.7–7.7)
Neutrophils Relative %: 83 %
Platelets: 401 K/uL — ABNORMAL HIGH (ref 150–400)
RBC: 2.87 MIL/uL — ABNORMAL LOW (ref 3.87–5.11)
RDW: 12.3 % (ref 11.5–15.5)
WBC: 7.5 K/uL (ref 4.0–10.5)
nRBC: 0 % (ref 0.0–0.2)

## 2024-05-07 LAB — BASIC METABOLIC PANEL WITH GFR
Anion gap: 9 (ref 5–15)
BUN: 13 mg/dL (ref 8–23)
CO2: 29 mmol/L (ref 22–32)
Calcium: 9.3 mg/dL (ref 8.9–10.3)
Chloride: 98 mmol/L (ref 98–111)
Creatinine, Ser: 0.92 mg/dL (ref 0.44–1.00)
GFR, Estimated: >60 mL/min (ref >60)
Glucose, Bld: 228 mg/dL — ABNORMAL HIGH (ref 70–99)
Potassium: 3.6 mmol/L (ref 3.5–5.1)
Sodium: 136 mmol/L (ref 135–145)

## 2024-05-07 LAB — MAGNESIUM: Magnesium: 1.4 mg/dL — ABNORMAL LOW (ref 1.7–2.4)

## 2024-05-07 MED ORDER — PANTOPRAZOLE SODIUM 40 MG PO TBEC
40.0000 mg | DELAYED_RELEASE_TABLET | Freq: Every day | ORAL | Status: DC
  Administered 2024-05-07 – 2024-05-11 (×5): 40 mg via ORAL
  Filled 2024-05-07 (×5): qty 1

## 2024-05-07 MED ORDER — IPRATROPIUM-ALBUTEROL 0.5-2.5 (3) MG/3ML IN SOLN: 3.0000 mL | Freq: Four times a day (QID) | RESPIRATORY_TRACT | Status: DC | PRN

## 2024-05-07 MED ORDER — MAGNESIUM SULFATE 4 GM/100ML IV SOLN
4.0000 g | Freq: Once | INTRAVENOUS | Status: AC
  Administered 2024-05-07: 4 g via INTRAVENOUS
  Filled 2024-05-07: qty 100

## 2024-05-07 MED ORDER — VITAMIN B-12 100 MCG PO TABS
500.0000 ug | ORAL_TABLET | Freq: Every day | ORAL | Status: DC
  Administered 2024-05-08 – 2024-05-11 (×4): 500 ug via ORAL
  Filled 2024-05-07 (×4): qty 5

## 2024-05-07 NOTE — Progress Notes (Signed)
 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  She is having pain in the right upper chest area radiating into the back worse with coughing.  She is also been having fever and chills at home.  She also reports having nonproductive nocturnal coughing.  She has been having some increasing shortness of breath with exertion.  She has increasing oxygen  requirement with exertion noted in the ED to become hypoxic with ambulating and tachycardic.  Her oxygen  was increased to 4 L/min from baseline of 3 L/min with exertion.  Her chest x-ray demonstrated concern for right upper lobe infiltrates.  Her cardiac workup has been reassuring with 2 normal high-sensitivity troponin tests.  Patient being admitted for further management.   Past Medical History:  Diagnosis Date   Anxiety    Blind    right   Colitis 09/07/2011   CVA (cerebral infarction)    GERD (gastroesophageal reflux disease)    Hyperlipidemia    Hypertension    Myocardial infarct (HCC)    1997, 2004   Severe major depression with psychotic features (HCC)    2004   Stroke [facility]) 2002    Past Surgical History:  Procedure Laterality Date   ABDOMINAL HYSTERECTOMY     BILATERAL SALPINGOOPHORECTOMY     BIOPSY  05/08/2022   Procedure: BIOPSY;  Surgeon: Eartha Angelia Sieving, MD;  Location: [facility] SU[facility];   Service: Gastroenterology;;   CHOLECYSTECTOMY N/A 10/15/2021   Procedure: LAPAROSCOPIC CHOLECYSTECTOMY WITH INTRAOPERATIVE CHOLANGIOGRAM;  Surgeon: Mavis Anes, MD;  Location: [facility] ORS;  Service: General;  Laterality: N/A;   COLONOSCOPY N/A 07/17/2014   RMR: Extremely redundant colon. Colonic diverticulosis. Inadequate prepartation precluded examination of all the rectal and colonic.  I suspect slow colonic transit in the setting of a markely redundant colon.   CORONARY ANGIOPLASTY WITH STENT PLACEMENT  [id number]   cyst removed from left wrist     ERCP N/A 10/10/2021   Procedure: ENDOSCOPIC RETROGRADE CHOLANGIOPANCREATOGRAPHY (ERCP);  Surgeon: Golda Claudis PENNER, MD;  Location: [facility] ORS;  Service: Gastroenterology;  Laterality: N/A;   ERCP N/A 10/13/2021   Procedure: ENDOSCOPIC RETROGRADE CHOLANGIOPANCREATOGRAPHY (ERCP);  Surgeon: Golda Claudis PENNER, MD;  Location: [facility] ORS;  Service: Gastroenterology;  Laterality: N/A;   ESOPHAGEAL DILATION  05/08/2022   Procedure: ESOPHAGEAL DILATION;  Surgeon: Eartha Angelia, Sieving, MD;  Location: [facility];  Service: Gastroenterology;;  savory dilation'   ESOPHAGOGASTRODUODENOSCOPY  09/05/2004   Dr Vilma gastritis, candida esophagitis   ESOPHAGOGASTRODUODENOSCOPY  09/07/2011   DOQ:Mpwh, esophageal in the distal esophagus/Mild gastritis   ESOPHAGOGASTRODUODENOSCOPY (EGD) WITH PROPOFOL  N/A 05/08/2022   Procedure: ESOPHAGOGASTRODUODENOSCOPY (EGD) WITH PROPOFOL ;  Surgeon: Eartha Angelia Sieving, MD;  Location: [facility];  Service: Gastroenterology;  Laterality: N/A;  with possible esophageal dilation   Lumps removed from each breast     SPHINCTEROTOMY N/A 10/10/2021   Procedure: SPHINCTEROTOMY INCOMPLETE, STONE NOT REMOVED;  Surgeon: Golda Claudis PENNER, MD;  Location: [facility] ORS;  Service: Gastroenterology;  Laterality: N/A;     reports that she quit smoking about 6 years ago. Her smoking use included cigarettes. She started smoking about 46 years ago.  She has a 10 pack-year smoking history. She has never used smokeless tobacco. She reports current drug use. Drug: Marijuana. She reports that she does not drink alcohol .  Allergies  Allergen Reactions   Aspirin  Swelling and Other (See Comments)    Only in large doses will cause a reaction   Bee Venom Swelling    Severe swelling   Contrast Media [Iodinated Contrast Media] Swelling   Lisinopril Swelling   Motrin [Ibuprofen] Swelling   Penicillins Anaphylaxis    Tolerated Rocephin  Jan 2025   Dilaudid  [Hydromorphone  Hcl] Itching    Family History  Problem Relation Age of Onset   Multiple sclerosis Father    Alzheimer's disease Mother    Hypertension Mother    Diabetes Mother    Colon cancer Neg Hx     Prior to Admission medications   Medication Sig Start Date End Date Taking? Authorizing Provider  aspirin  EC 81 MG tablet Take 81 mg by mouth daily. Swallow whole.   Yes [provider]  citalopram  (CELEXA ) 20 MG tablet Take 20 mg by mouth daily. 11/01/23  Yes [provider]  clopidogrel  (PLAVIX ) 75 MG tablet Take 1 tablet (75 mg total) by mouth daily. 06/17/22  Yes Singh, Prashant K, MD  folic acid  (FOLVITE ) 1 MG tablet Take 1 tablet (1 mg total) by mouth daily. 10/17/21  Yes Tat, Alm, MD  losartan  (COZAAR ) 25 MG tablet Take 12.5 mg by mouth daily. 10/08/23  Yes [provider]  metoprolol  succinate (TOPROL  XL) 50 MG 24 hr tablet Take 1 tablet (50 mg total) by mouth daily. Take with or immediately following a meal. 03/30/23  Yes Mallipeddi, Vishnu P, MD  mirtazapine  (REMERON ) 7.5 MG tablet Take 7.5 mg by mouth at bedtime. 04/27/22  Yes [provider]  montelukast  (SINGULAIR ) 10 MG tablet Take 10 mg by mouth at bedtime.   Yes [provider]  ondansetron  (ZOFRAN ) 4 MG tablet Take 1 tablet (4 mg total) by mouth every 8 (eight) hours as needed for nausea or vomiting. 11/24/23  Yes Shah, Pratik D, DO  pantoprazole  (PROTONIX ) 40 MG tablet Take 40 mg by  mouth daily.   Yes [provider]  rosuvastatin  (CRESTOR ) 40 MG tablet Take 1 tablet (40 mg total) by mouth daily. 04/07/24  Yes Del Wilhelmena Falter, Hilario, FNP  STIOLTO RESPIMAT  2.5-2.5 MCG/ACT AERS Inhale 2 sprays into the lungs daily. 04/18/24  Yes [provider]  torsemide  (DEMADEX ) 20 MG tablet Take 40 mg by mouth 2 (two) times daily.   Yes [provider]  acetaminophen  (TYLENOL ) 650 MG CR tablet Take 1,300 mg by mouth every 8 (eight) hours as needed for pain.    [provider]  cloNIDine  (CATAPRES ) 0.3 MG tablet Take 1 tablet (0.3 mg total) by mouth 2 (two) times daily as needed. 03/30/24   Del Orbe Polanco, Iliana, FNP  fluticasone  (FLONASE ) 50 MCG/ACT nasal spray Place 1 spray into both nostrils daily as needed for rhinitis or allergies. 01/04/24   [provider]  Fluticasone -Umeclidin-Vilant (TRELEGY ELLIPTA ) 100-62.5-25 MCG/ACT AEPB Inhale 1 puff into the lungs in the morning. 01/25/24   [name] Ozell NOVAK, MD  Ipratropium-Albuterol  (COMBIVENT ) 20-100 MCG/ACT AERS respimat Inhale 1 puff into the lungs every 6 (six) hours as needed for wheezing or shortness of  breath. 04/25/24   [name] Ozell NOVAK, MD  oxyCODONE -acetaminophen  (PERCOCET) 5-325 MG tablet Take 1 tablet by mouth every 4 (four) hours as needed. 12/29/23   Geroldine Berg, MD  traMADol  (ULTRAM ) 50 MG tablet Take 50 mg by mouth 2 (two) times daily as needed for moderate pain (pain score 4-6). 10/21/23   [provider]  umeclidinium-vilanterol (ANORO ELLIPTA ) 62.5-25 MCG/ACT AEPB Inhale 1 puff into the lungs daily. 01/25/24   [name] Ozell NOVAK, MD  vitamin B-12 (CYANOCOBALAMIN ) 500 MCG tablet Take 1 tablet (500 mcg total) by mouth daily. 10/16/21   Evonnie Lenis, MD    Physical Exam: Vitals:   05/07/24 9257 05/07/24 0748 05/07/24 1316 05/07/24 1336  BP:   (!) 108/49   Pulse:   (!) 101   Resp:   20   Temp:   98.7 F (37.1 C)   TempSrc:      SpO2: 99% 99% 96% 99%  Weight:      Height:         Constitutional: NAD, calm, comfortable Eyes: PERRL, lids and conjunctivae normal ENMT: Mucous membranes are moist. Posterior pharynx clear of any exudate or lesions.Normal dentition.  Neck: normal, supple, no masses, no thyromegaly Respiratory: diminished BS RUL with rales heard RUL, bilateral expiratory wheezing heard.  Cardiovascular: normal s1, s2 sounds, no murmurs / rubs / gallops. No extremity edema. 2+ pedal pulses. No carotid bruits.  Abdomen: no tenderness, no masses palpated. No hepatosplenomegaly. Bowel sounds positive.  Musculoskeletal: no clubbing / cyanosis. No joint deformity upper and lower extremities. Good ROM, no contractures. Normal muscle tone.  Skin: no rashes, lesions, ulcers. No induration Neurologic: CN 2-12 grossly intact. Sensation intact, DTR normal. Strength 5/5 in all 4.  Psychiatric: Normal judgment and insight. Alert and oriented x 3. Normal mood.   Labs on Admission: I have personally reviewed following labs and imaging studies  CBC: Recent Labs  Lab 05/06/24 0733 05/07/24 0347  WBC 10.6* 7.5  NEUTROABS  --  6.3  HGB 9.3* 9.5*  HCT 30.8* 30.9*  MCV 108.8* 107.7*  PLT 392 401*   Basic Metabolic Panel: Recent Labs  Lab 05/06/24 0733 05/07/24 0347  NA 140 136  K 3.8 3.6  CL 97* 98  CO2 31 29  GLUCOSE 151* 228*  BUN 9 13  CREATININE 1.06* 0.92  CALCIUM  8.7* 9.3  MG  --  1.4*   GFR: Estimated Creatinine Clearance: 38.5 mL/min (by C-G formula based on SCr of 0.92 mg/dL). Liver Function Tests: No results for input(s): AST, ALT, ALKPHOS, BILITOT, PROT, ALBUMIN in the last 168 hours. No results for input(s): LIPASE, AMYLASE in the last 168 hours. No results for input(s): AMMONIA in the last 168 hours. Coagulation Profile: No results for input(s): INR, PROTIME in the last 168 hours. Cardiac Enzymes: No results for input(s): CKTOTAL, CKMB, CKMBINDEX, TROPONINI in the last 168 hours. BNP (last 3 results) No  results for input(s): PROBNP in the last 8760 hours. HbA1C: No results for input(s): HGBA1C in the last 72 hours. CBG: No results for input(s): GLUCAP in the last 168 hours. Lipid Profile: No results for input(s): CHOL, HDL, LDLCALC, TRIG, CHOLHDL, LDLDIRECT in the last 72 hours. Thyroid  Function Tests: No results for input(s): TSH, T4TOTAL, FREET4, T3FREE, THYROIDAB in the last 72 hours. Anemia Panel: Recent Labs    05/06/24 1330  VITAMINB12 764  FOLATE 35.5  FERRITIN 210  TIBC 221*  IRON 40  RETICCTPCT 2.3   Urine analysis:    Component Value  Date/Time   COLORURINE YELLOW 01/07/2024 0130   APPEARANCEUR HAZY (A) 01/07/2024 0130   LABSPEC 1.013 01/07/2024 0130   PHURINE 5.0 01/07/2024 0130   GLUCOSEU NEGATIVE 01/07/2024 0130   HGBUR NEGATIVE 01/07/2024 0130   BILIRUBINUR NEGATIVE 01/07/2024 0130   KETONESUR NEGATIVE 01/07/2024 0130   PROTEINUR 30 (A) 01/07/2024 0130   UROBILINOGEN 1.0 07/16/2014 0244   NITRITE NEGATIVE 01/07/2024 0130   LEUKOCYTESUR NEGATIVE 01/07/2024 0130    Radiological Exams on Admission: DG Chest Port 1 View Result Date: 05/06/2024 EXAM: 1 VIEW XRAY OF THE CHEST 05/06/2024 07:47:00 AM COMPARISON: 01/10/2024 CLINICAL HISTORY: Cough, CP. Per chart: Pt c/o left-sided CP radiating to neck since earlier this morning. Pt reports sweating also. Rated 8/10. 2 prior stents. 3L O2 chronic; O2 saturation mid-80s on 3L. O2 saturation increased to 97% on 4L nasal cannula. FINDINGS: LUNGS AND PLEURA: New peripheral opacities within the right upper lobe may represent atelectasis or airspace disease. No pleural effusion or pneumothorax. Advanced changes of COPD/emphysema. HEART AND MEDIASTINUM: No acute abnormality of the cardiac and mediastinal silhouettes. Aortic atherosclerotic calcification. BONES AND SOFT TISSUES: No acute osseous abnormality. IMPRESSION: 1. New peripheral opacities within the right upper lobe, possibly representing  atelectasis or airspace disease. 2. Advanced changes of COPD/emphysema. 3. Aortic atherosclerotic calcification. Electronically signed by: Waddell Calk MD 05/06/2024 07:53 AM EDT RP Workstation: [id number]    EKG: Independently reviewed. No acute ST-T wave findings, sinus tachycardia  Assessment/Plan Principal Problem:   Acute and chronic respiratory failure with hypoxia (HCC) Active Problems:   Essential hypertension   History of stroke   Functional constipation   Dysphagia   Gastroesophageal reflux disease   Macrocytic anemia   Protein-calorie malnutrition, severe   COPD GOLD ?   Former smoker   CAD (coronary artery disease)   Heart failure with improved ejection fraction (HFimpEF) (HCC)   Long term (current) use of antithrombotics/antiplatelets   Leukocytosis   CAP (community acquired pneumonia)    Acute on chronic respiratory failure with hypoxia -Most likely secondary to right upper lobe pneumonia -Continue supportive measures and pneumonia treatment as ordered -Increased supplemental oxygen  to 4 L/min with exertion -Hopefully can wean down to 3 L/min with treatments (which is her baseline) -- her new baseline may be 4L/min -IV Solu-Medrol  x 1 dose ordered on 8/16  -Bronchodilators ordered -Continue antibiotics as ordered  Right upper lobe pneumonia -Patient presents with leukocytosis, fever and chills, cough, chest x-ray findings -IV antibiotics ordered ceftriaxone  and doxycycline  -Mucinex  600 mg twice daily and Delsym  as needed cough symptoms -Pulmonary toilet and incentive spirometry  -Bronchodilators ordered  Chest pain, atypical -ACS ruled out with 2 normal high-sensitivity troponin tests -Likely secondary to pneumonia and GI -GI cocktail ordered -Treating pneumonia as noted above -IV famotidine  ordered -Treat supportively -Scheduled acetaminophen  given pleuritic nature of pain  Leukocytosis -Secondary to pneumonia -Anticipate leukemoid reaction from  steroids -Following  Macrocytic anemia -Resume home daily folic acid  -Follow-up anemia panel testing results  GERD -Treating aggressively with IV famotidine  twice daily  Chronic HFimpEF -Continue home meds -Appears compensated -Follow clinically  DVT prophylaxis: sq heparin    Code Status: Full   Family Communication: son bedside   Disposition Plan: anticipate home   Consults called:   Admission status: INP Time spent: 62 mins   Level of care: Telemetry Afton Louder MD [facility] How to contact the [facility] Attending or Consulting provider 7A - 7P or covering provider during after hours 7P -7A, for this patient?  Check the  care team in [facility] and look for a) attending/consulting TR[facility] provider listed and b) the TRH team listed Log into [email or web address] and use 's universal password to access. If you do not have the password, please contact the hospital operator. Locate the TR[facility] provider you are looking for under [facility] and page to a number that you can be directly reached. If you still have difficulty reaching the provider, please page the [facility] (Director on Call) for the Hospitalists listed on amion for assistance.   If 7PM-7AM, please contact night-coverage [email or web address] Password [facility]  05/07/2024, 2:33 PM

## 2024-05-07 NOTE — Plan of Care (Signed)

## 2024-05-07 NOTE — TOC Initial Note (Signed)
 Transition of Care [facility]) - Initial/Assessment Note    Patient Details  Name: [name] MRN: [id number] Date of Birth: [birth year]  [facility]) CM/SW Contact:    Nena LITTIE Coffee, RN Phone Number: 05/07/2024, 9:38 PM  Clinical Narrative:                 Pt assessed for high readmission score. She is from home c/oxygen  at 3 lpm at baseline. At previous discharge pt was referred to [facility]. Pt states she would use same service again if [facility] is recommended.   Expected Discharge Plan: Home/Self Care Barriers to Discharge: Continued Medical Work up   Patient Goals and CMS Choice            Expected Discharge Plan and Services In-house Referral: Clinical Social Work Discharge Planning Services: CM Consult   Living arrangements for the past 2 months: Single Family Home                                      Prior Living Arrangements/Services Living arrangements for the past 2 months: Single Family Home Lives with:: Siblings Patient language and need for interpreter reviewed:: Yes Do you feel safe going back to the place where you live?: Yes      Need for Family Participation in Patient Care: Yes (Comment) Care giver support system in place?: Yes (comment) Current home services: DME (oxygen  (unknown provider)) Criminal Activity/Legal Involvement Pertinent to Current Situation/Hospitalization: No - Comment as needed  Activities of Daily Living   ADL Screening (condition at time of admission) Independently performs ADLs?: Yes (appropriate for developmental age) Is the patient deaf or have difficulty hearing?: No Does the patient have difficulty seeing, even when wearing glasses/contacts?: No Does the patient have difficulty concentrating, remembering, or making decisions?: No  Permission Sought/Granted                  Emotional Assessment Appearance:: Appears stated age Attitude/Demeanor/Rapport: Engaged Affect (typically  observed): Appropriate Orientation: : Oriented to Self, Oriented to Place, Oriented to  Time, Oriented to Situation Alcohol  / Substance Use: Not Applicable Psych Involvement: No (comment)  Admission diagnosis:  Acute respiratory failure with hypoxia (HCC) [J96.01] Acute and chronic respiratory failure with hypoxia (HCC) [J96.21] Patient Active Problem List   Diagnosis Date Noted   Acute and chronic respiratory failure with hypoxia (HCC) 05/06/2024   Leukocytosis 05/06/2024   CAP (community acquired pneumonia) 05/06/2024   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 10/01/2022   Chronic hypoxic respiratory failure (HCC) 10/01/2022   Protein-calorie malnutrition, severe 06/13/2022   Impaired glucose tolerance 06/02/2022   Hypernatremia 06/02/2022   Hypophosphatemia 06/01/2022   Hyperkalemia 05/31/2022   Malnutrition of moderate degree 05/30/2022   Lobar pneumonia (HCC) 05/30/2022   Elevated troponin 05/29/2022   Chronic kidney disease, stage 3a (HCC) 05/29/2022   COPD with acute exacerbation (HCC) 05/29/2022   Macrocytic anemia    Gastroesophageal reflux disease  Nausea vomiting and diarrhea    Dysphagia    Pressure injury of skin 05/03/2022   COVID-19 virus infection 05/02/2022   AKI (acute kidney injury) (HCC) 05/02/2022   Acute respiratory failure with hypoxia and hypercarbia (HCC) 05/02/2022   Hypokalemia    Calculus of gallbladder with acute cholecystitis and obstruction    Choledocholithiasis 10/09/2021    Noninfectious gastroenteritis 07/16/2014   Functional constipation 10/27/2012   Colon cancer screening 10/27/2012   History of stroke 10/26/2012   Chronic back pain 10/26/2012   Dehydration 09/07/2011   HYPERLIPIDEMIA-MIXED 03/18/2009   Essential hypertension 03/18/2009   Disorder of kidney and ureter 03/18/2009   PCP:  [name], FNP Pharmacy:   [facility] [phone] - [place], [place] - [address] AT [facility] [address] [place] [address] Phone: [phone] Fax: [phone]     Social Drivers of Health (SDOH) Social History: SDOH Screenings   Food Insecurity: No Food Insecurity (05/06/2024)  Housing: Low Risk  (05/06/2024)  Transportation Needs: No Transportation Needs (05/06/2024)  Utilities: Not At Risk (05/06/2024)  Depression (PHQ2-9): Low Risk  (03/30/2024)  Social Connections: Moderately Isolated (05/06/2024)  Tobacco Use: Medium Risk (05/06/2024)   SDOH Interventions:     Readmission Risk Interventions    05/07/2024    9:32 PM 01/11/2024   10:17 AM 01/07/2024   11:02 AM  Readmission Risk Prevention Plan  Transportation Screening Complete Complete Complete  PCP or Specialist Appt within 3-5 Days Complete  Not Complete  HRI or Home Care Consult Complete  Complete  Social Work Consult for Recovery Care Planning/Counseling Complete  Complete  Palliative Care Screening Not Applicable  Complete  Medication Review Oceanographer) Complete Complete Complete  HRI or Home Care Consult  Complete   SW Recovery Care/Counseling Consult  Complete   Palliative Care Screening  Not Applicable   Skilled Nursing Facility  Not Applicable

## 2024-05-07 NOTE — Plan of Care (Signed)
  Problem: Health Behavior/Discharge Planning: Goal: Ability to manage health-related needs will improve Outcome: Progressing   Problem: Clinical Measurements: Goal: Will remain free from infection Outcome: Progressing   Problem: Coping: Goal: Level of anxiety will decrease Outcome: Progressing   Problem: Pain Managment: Goal: General experience of comfort will improve and/or be controlled Outcome: Progressing   Problem: Safety: Goal: Ability to remain free from injury will improve Outcome: Progressing   Problem: Skin Integrity: Goal: Risk for impaired skin integrity will decrease Outcome: Progressing

## 2024-05-08 ENCOUNTER — Inpatient Hospital Stay (HOSPITAL_COMMUNITY)

## 2024-05-08 DIAGNOSIS — J189 Pneumonia, unspecified organism: Secondary | ICD-10-CM | POA: Diagnosis not present

## 2024-05-08 DIAGNOSIS — J9621 Acute and chronic respiratory failure with hypoxia: Secondary | ICD-10-CM | POA: Diagnosis not present

## 2024-05-08 DIAGNOSIS — I5032 Chronic diastolic (congestive) heart failure: Secondary | ICD-10-CM | POA: Diagnosis not present

## 2024-05-08 DIAGNOSIS — I2510 Atherosclerotic heart disease of native coronary artery without angina pectoris: Secondary | ICD-10-CM | POA: Diagnosis not present

## 2024-05-08 LAB — CBC WITH DIFFERENTIAL/PLATELET
Abs Immature Granulocytes: 0.05 K/uL (ref 0.00–0.07)
Basophils Absolute: 0.1 K/uL (ref 0.0–0.1)
Basophils Relative: 0 %
Eosinophils Absolute: 0.1 K/uL (ref 0.0–0.5)
Eosinophils Relative: 1 %
HCT: 29.2 % — ABNORMAL LOW (ref 36.0–46.0)
Hemoglobin: 8.6 g/dL — ABNORMAL LOW (ref 12.0–15.0)
Immature Granulocytes: 0 %
Lymphocytes Relative: 22 %
Lymphs Abs: 2.9 K/uL (ref 0.7–4.0)
MCH: 32.2 pg (ref 26.0–34.0)
MCHC: 29.5 g/dL — ABNORMAL LOW (ref 30.0–36.0)
MCV: 109.4 fL — ABNORMAL HIGH (ref 80.0–100.0)
Monocytes Absolute: 1.1 K/uL — ABNORMAL HIGH (ref 0.1–1.0)
Monocytes Relative: 8 %
Neutro Abs: 8.8 K/uL — ABNORMAL HIGH (ref 1.7–7.7)
Neutrophils Relative %: 69 %
Platelets: 347 K/uL (ref 150–400)
RBC: 2.67 MIL/uL — ABNORMAL LOW (ref 3.87–5.11)
RDW: 12.6 % (ref 11.5–15.5)
WBC: 12.9 K/uL — ABNORMAL HIGH (ref 4.0–10.5)
nRBC: 0 % (ref 0.0–0.2)

## 2024-05-08 LAB — BASIC METABOLIC PANEL WITH GFR
Anion gap: 8 (ref 5–15)
BUN: 13 mg/dL (ref 8–23)
CO2: 32 mmol/L (ref 22–32)
Calcium: 8.9 mg/dL (ref 8.9–10.3)
Chloride: 99 mmol/L (ref 98–111)
Creatinine, Ser: 0.93 mg/dL (ref 0.44–1.00)
GFR, Estimated: >60 mL/min (ref >60)
Glucose, Bld: 114 mg/dL — ABNORMAL HIGH (ref 70–99)
Potassium: 3.5 mmol/L (ref 3.5–5.1)
Sodium: 139 mmol/L (ref 135–145)

## 2024-05-08 LAB — TROPONIN I (HIGH SENSITIVITY)
Troponin I (High Sensitivity): 21 ng/L — ABNORMAL HIGH (ref <18)
Troponin I (High Sensitivity): 19 ng/L — ABNORMAL HIGH (ref <18)

## 2024-05-08 LAB — MAGNESIUM: Magnesium: 2.2 mg/dL (ref 1.7–2.4)

## 2024-05-08 MED ORDER — METHYLPREDNISOLONE SODIUM SUCC 40 MG IJ SOLR
40.0000 mg | Freq: Once | INTRAMUSCULAR | Status: AC
  Administered 2024-05-08: 40 mg via INTRAVENOUS
  Filled 2024-05-08: qty 1

## 2024-05-08 MED ORDER — ALUM & MAG HYDROXIDE-SIMETH 200-200-20 MG/5ML PO SUSP
30.0000 mL | Freq: Once | ORAL | Status: AC
  Administered 2024-05-08: 30 mL via ORAL
  Filled 2024-05-08: qty 30

## 2024-05-08 MED ORDER — SODIUM CHLORIDE 0.9 % IV SOLN
2.0000 g | INTRAVENOUS | Status: AC
  Administered 2024-05-08 – 2024-05-10 (×3): 2 g via INTRAVENOUS
  Filled 2024-05-08 (×3): qty 20

## 2024-05-08 MED ORDER — SODIUM CHLORIDE 0.9 % IV SOLN
100.0000 mg | Freq: Two times a day (BID) | INTRAVENOUS | Status: DC
  Administered 2024-05-08 – 2024-05-11 (×6): 100 mg via INTRAVENOUS
  Filled 2024-05-08 (×6): qty 100

## 2024-05-08 NOTE — Progress Notes (Signed)
 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  She is having pain in the right upper chest area radiating into the back worse with coughing.  She is also been having fever and chills at home.  She also reports having nonproductive nocturnal coughing.  She has been having some increasing shortness of breath with exertion.  She has increasing oxygen  requirement with exertion noted in the ED to become hypoxic with ambulating and tachycardic.  Her oxygen  was increased to 4 L/min from baseline of 3 L/min with exertion.  Her chest x-ray demonstrated concern for right upper lobe infiltrates.  Her cardiac workup has been reassuring with 2 normal high-sensitivity troponin tests.  Patient being admitted for further management.   Assessment and Plan:  Acute on chronic respiratory failure with hypoxia -Most likely secondary to right upper lobe pneumonia -Continue supportive measures and pneumonia treatment as ordered -Increased supplemental oxygen  to 4 L/min with exertion -Hopefully can wean down to 3 L/min with treatments (which is her baseline) -- her new baseline may be 4L/min -IV Solu-Medrol  x 1 dose ordered on 8/16, repeat dose on 8/18  -Bronchodilators ordered -Continue antibiotics as ordered   Right upper lobe pneumonia -Patient presents with leukocytosis, fever and chills, cough, chest x-ray findings -IV antibiotics ordered ceftriaxone  and doxycycline  -Mucinex  600  mg twice daily and Delsym  as needed cough symptoms -Pulmonary toilet and incentive spirometry  -Bronchodilators ordered   Chest pain, atypical -ACS ruled out with 2 normal high-sensitivity troponin tests -Likely secondary to pneumonia  -GI cocktail ordered -Treating pneumonia as noted above -IV famotidine  ordered -Treat supportively -Scheduled acetaminophen  given pleuritic nature of pain --CXR repeated 8/18 with progressive multifocal pneumonia, intensify antibiotic treatments and continue supportive measures    Leukocytosis -Secondary to pneumonia -Anticipate leukemoid reaction from steroids -Following   Macrocytic anemia -Resume home daily folic acid  -Follow-up anemia panel testing results   GERD -Treating aggressively with IV famotidine  twice daily   Chronic HFimpEF -Continue home meds -Appears compensated -Follow clinically   DVT prophylaxis: sq heparin   Code Status: Full  Family Communication: son bedside update  Disposition: anticipating home    Consultants:   Procedures:   Antimicrobials:  CTX 8/17>> Doxycycline  8/17>>   Subjective: Pt having chest pain this morning in central right chest radiating into back.  Still having cough but less intense.   Objective: Vitals:   05/07/24 2032 05/08/24 0441 05/08/24 0758 05/08/24 1339  BP: (!) 152/89 138/77  (!) 109/49  Pulse: 94 89  98  Resp: 16     Temp: 98 F (36.7 C) (!) 97.5 F (36.4 C)  99 F (37.2 C)  TempSrc: Oral Oral  Oral  SpO2: 92% 92% 94% 100%  Weight:      Height:        Intake/Output Summary (Last 24 hours) at 05/08/2024 1403 Last data filed at 05/07/2024 1844 Gross per 24 hour  Intake 120 ml  Output --  Net 120 ml   Filed Weights   05/06/24 0719  Weight:  54.4 kg   Examination:  General exam: Appears calm and comfortable  Respiratory system: rales heard RUL. Cardiovascular system: normal S1 & S2 heard. No JVD, murmurs, rubs, gallops or clicks. No pedal edema. Gastrointestinal  system: Abdomen is nondistended, soft and nontender. No organomegaly or masses felt. Normal bowel sounds heard. Central nervous system: Alert and oriented. No focal neurological deficits. Extremities: Symmetric 5 x 5 power. Skin: No rashes, lesions or ulcers. Psychiatry: Judgement and insight appear normal. Mood & affect appropriate.   Data Reviewed: I have personally reviewed following labs and imaging studies  CBC: Recent Labs  Lab 05/06/24 0733 05/07/24 0347 05/08/24 0339  WBC 10.6* 7.5 12.9*  NEUTROABS  --  6.3 8.8*  HGB 9.3* 9.5* 8.6*  HCT 30.8* 30.9* 29.2*  MCV 108.8* 107.7* 109.4*  PLT 392 401* 347    Basic Metabolic Panel: Recent Labs  Lab 05/06/24 0733 05/07/24 0347 05/08/24 0339  NA 140 136 139  K 3.8 3.6 3.5  CL 97* 98 99  CO2 31 29 32  GLUCOSE 151* 228* 114*  BUN 9 13 13   CREATININE 1.06* 0.92 0.93  CALCIUM  8.7* 9.3 8.9  MG  --  1.4* 2.2    CBG: No results for input(s): GLUCAP in the last 168 hours.  No results found for this or any previous visit (from the past 240 hours).   Radiology Studies: DG CHEST PORT 1 VIEW Result Date: 05/08/2024 CLINICAL DATA:  Chest pain.  History of COPD. EXAM: PORTABLE CHEST 1 VIEW COMPARISON:  05/06/2024. FINDINGS: Cardiac silhouette is normal in size and configuration. No mediastinal or left hilar masses. Right hilum is less well evaluated due to patient rotation. Right pulmonary artery appears prominent. No convincing hilar mass. There is coarse interstitial thickening, most evident in the right mid to lower lung, with additional linear and reticular areas of lung scarring. Relative lucency is noted in in the upper lobes, most evident at the right apex, consistent with emphysema. There are additional right lung airspace opacities peripherally in the right mid to lower lung, increased when compared to the prior chest radiograph. No convincing pleural effusion.  No pneumothorax. Skeletal structures are demineralized. Old rib  fractures are noted on the left. IMPRESSION: 1. Right mid to lower lung airspace opacities have increased from the prior exam suspected to be due to multifocal pneumonia. These are superimposed on chronic areas of lung scarring and changes of emphysema. Electronically Signed   By: Alm Parkins M.D.   On: 05/08/2024 11:42    Scheduled Meds:  acetaminophen   650 mg Oral Q6H   Or   acetaminophen   650 mg Rectal Q6H   aspirin  EC  81 mg Oral Daily   budesonide -glycopyrrolate -formoterol   2 puff Inhalation BID   citalopram   20 mg Oral Daily   clopidogrel   75 mg Oral Daily   folic acid   1 mg Oral Daily   guaiFENesin   600 mg Oral BID   heparin   5,000 Units Subcutaneous Q8H   metoprolol  succinate  25 mg Oral Daily   mirtazapine   7.5 mg Oral QHS   montelukast   10 mg Oral QHS   pantoprazole   40 mg Oral Daily   polyethylene glycol  17 g Oral Daily   cyanocobalamin   500 mcg Oral Daily   Continuous Infusions:  cefTRIAXone  (ROCEPHIN )  IV     doxycycline  (VIBRAMYCIN ) IV     famotidine  (PEPCID ) IV 20 mg (05/08/24 0819)     LOS: 2 days   Time spent: 77  mins  Afton Louder, MD How to contact the [facility] Attending or Consulting provider 7A - 7P or covering provider during after hours 7P -7A, for this patient?  Check the care team in [facility] and look for a) attending/consulting TRH provider listed and b) the TRH team listed Log into [email or web address] to find provider on call.  Locate the [facility] provider you are looking for under [facility] and page to a number that you can be directly reached. If you still have difficulty reaching the provider, please page the [facility] (Director on Call) for the Hospitalists listed on amion for assistance.  05/08/2024, 2:03 PM

## 2024-05-08 NOTE — Evaluation (Signed)
 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  She is having pain in the right upper chest area radiating into the back worse with coughing.  She is also been having fever and chills at home.  She also reports having nonproductive nocturnal coughing.  She has been having some increasing shortness of breath with exertion.  She has increasing oxygen  requirement with exertion noted in the ED to become hypoxic with ambulating and tachycardic.  Her oxygen  was increased to 4 L/min from baseline of 3 L/min with exertion.  Her chest x-ray demonstrated concern for right upper lobe infiltrates.  Her cardiac workup has been reassuring with 2 normal high-sensitivity troponin tests.  Patient being admitted for further management.   Clinical Impression  Patient agreeable to PT evaluation. Patient was received supine in bed.  Mod independent with bed mobility. CGA given during transfers, and CGA/min A while ambulating without AD in room. Pt reports at baseline she does not use any assistive equipment but has a RW available. Reports she has 24/7 assist available through her sons. At end of ambulation trial, pt demo inc HR, reaching 104 bpm at most. SpO2 at 100% on 4 Lpm. Pt reports some L sided chest and upper back pain. Nursing staff notified.  Patient limited most due to fatigue, SOB, pain, and is generally weak throughout. Patient will benefit from continued skilled physical therapy acutely and in recommended venue in order to address  current deficits to improve overall function. Pt back in bed at EOS, call button within reach.        If plan is discharge home, recommend the following: A little help with walking and/or transfers;A little help with bathing/dressing/bathroom;Help with stairs or ramp for entrance;Assist for transportation   Can travel by private vehicle        Equipment Recommendations None recommended by PT  Recommendations for Other Services       Functional Status Assessment Patient has had a recent decline in their functional status and demonstrates the ability to make significant improvements in function in a reasonable and predictable amount of time.     Precautions / Restrictions Precautions Precautions: Fall Recall of Precautions/Restrictions: Intact Restrictions Weight Bearing Restrictions Per Provider Order: No      Mobility  Bed Mobility Overal bed mobility: Needs Assistance Bed Mobility: Supine to Sit, Sit to Supine     Supine to sit: Modified independent (Device/Increase time), HOB elevated Sit to supine: Modified independent (Device/Increase time), HOB elevated   General bed mobility comments: HOB elevated, inc time needed secondary to inc fatigue and pain    Transfers Overall transfer level: Needs assistance Equipment used: None Transfers: Sit to/from Stand, Bed to chair/wheelchair/BSC Sit to Stand: Contact guard assist   Step pivot transfers: Contact guard assist       General transfer comment: Pt demo slow movement due to fatigue and inc pain, Pt leans onto arm rest of recliner during bed>chair transfer. inc trunk forward lean throughout    Ambulation/Gait Ambulation/Gait assistance: Contact guard assist Gait Distance (Feet): 10 Feet Assistive device: 1 person hand held assist, None  Gait Pattern/deviations: Step-through pattern, Decreased step length - right, Decreased step length - left, Trunk flexed Gait velocity: Dec     General Gait Details: Pt demo  slow labored movement. Initial steps with 1 person hand held assist on pt LUE. Last 5 ft without assist. Pt mildly unsteady but mostly limited due to SOB, fatigue, and pain.  Stairs            Wheelchair Mobility     Tilt Bed    Modified Rankin (Stroke Patients Only)       Balance Overall balance assessment: Needs assistance Sitting-balance support: Feet supported, No upper extremity supported Sitting balance-Leahy Scale: Good Sitting balance - Comments: Seated EOB and recliner   Standing balance support: During functional activity, No upper extremity supported, Single extremity supported Standing balance-Leahy Scale: Fair Standing balance comment: fair/good         Pertinent Vitals/Pain Pain Assessment Pain Assessment: 0-10 Pain Score: 7  Pain Location: L Chest pain Pain Descriptors / Indicators: Discomfort, Sharp Pain Intervention(s): Limited activity within patient's tolerance, Other (comment) (Made RN aware at end of session)    Home Living Family/patient expects to be discharged to:: Private residence Living Arrangements: Children Available Help at Discharge: Family;Available 24 hours/day Type of Home: Mobile home Home Access: Stairs to enter Entrance Stairs-Rails: Can reach both;Right;Left Entrance Stairs-Number of Steps: 5   Home Layout: One level Home Equipment: Rolling Walker (2 wheels);BSC/3in1;Grab bars - toilet;Grab bars - tub/shower;Shower seat Additional Comments: Pt reports no change in living situation since last admission. Pt reports she has 3 sons, live with her, avail 24/7 if needed.    Prior Function Prior Level of Function : Independent/Modified Independent;Driving     Mobility Comments: Pt reports cont community ambulation without AD, still driving ADLs Comments: Reports independence with ADLs. Reports she sponges bathes at sink as she's afraid of falling while attempting to get into tub     Extremity/Trunk Assessment   Upper  Extremity Assessment Upper Extremity Assessment: Overall WFL for tasks assessed;Generalized weakness (Shoulder flexion ROM WFL, MMT 4/5 but with pain bilat)    Lower Extremity Assessment Lower Extremity Assessment: Overall WFL for tasks assessed;Generalized weakness (Ankle DF 4+/5 bilat, Hip flexion MMT 4-/5 bilat)    Cervical / Trunk Assessment Cervical / Trunk Assessment: Kyphotic  Communication   Communication Communication: No apparent difficulties    Cognition Arousal: Alert Behavior During Therapy: WFL for tasks assessed/performed   PT - Cognitive impairments: No apparent impairments         Following commands: Intact       Cueing Cueing Techniques: Verbal cues, Tactile cues, Visual cues     General Comments      Exercises     Assessment/Plan    PT Assessment Patient needs continued PT services;All further PT needs can be met in the next venue of care  PT Problem List Decreased strength;Decreased activity tolerance;Decreased balance;Decreased mobility;Pain;Cardiopulmonary status limiting activity       PT Treatment Interventions DME instruction;Gait training;Stair training;Functional mobility training;Therapeutic activities;Therapeutic exercise;Balance training;Patient/family education    PT Goals (Current goals can be found in the Care Plan section)  Acute Rehab PT Goals Patient Stated Goal: Return home with HHPT services PT Goal Formulation: With patient Time For Goal Achievement: 05/12/24 Potential to Achieve Goals: Good    Frequency Min 3X/week     Co-evaluation               AM-PAC PT 6 Clicks Mobility  Outcome Measure Help needed  turning from your back to your side while in a flat bed without using bedrails?: None Help needed moving from lying on your back to sitting on the side of a flat bed without using bedrails?: None Help needed moving to and from a bed to a chair (including a wheelchair)?: A Little Help needed standing up from a  chair using your arms (e.g., wheelchair or bedside chair)?: A Little Help needed to walk in hospital room?: A Little Help needed climbing 3-5 steps with a railing? : A Lot 6 Click Score: 19    End of Session   Activity Tolerance: Patient limited by pain;Patient limited by fatigue Patient left: in bed;with call bell/phone within reach Nurse Communication: Mobility status;Other (comment) (Notified of pt chest pain) PT Visit Diagnosis: Pain;Muscle weakness (generalized) (M62.81);History of falling (Z91.81);Unsteadiness on feet (R26.81);Other abnormalities of gait and mobility (R26.89) Pain - part of body:  (L chest)    Time: 8491-8468 PT Time Calculation (min) (ACUTE ONLY): 23 min   Charges:   PT Evaluation $PT Eval Moderate Complexity: 1 Mod PT Treatments $Therapeutic Activity: 23-37 mins PT General Charges $$ ACUTE PT VISIT: 1 Visit         4:03 PM, 05/08/24 Kiyan Burmester Powell-Butler, PT, DPT [place] with [facility]

## 2024-05-08 NOTE — Plan of Care (Signed)

## 2024-05-08 NOTE — Plan of Care (Signed)
  Problem: Acute Rehab PT Goals(only PT should resolve) Goal: Pt Will Go Supine/Side To Sit Outcome: Progressing Flowsheets (Taken 05/08/2024 1605) Pt will go Supine/Side to Sit: Independently Goal: Patient Will Transfer Sit To/From Stand Outcome: Progressing Flowsheets (Taken 05/08/2024 1605) Patient will transfer sit to/from stand: with modified independence Goal: Pt Will Transfer Bed To Chair/Chair To Bed Outcome: Progressing Flowsheets (Taken 05/08/2024 1605) Pt will Transfer Bed to Chair/Chair to Bed: with modified independence Goal: Pt Will Ambulate Outcome: Progressing Flowsheets (Taken 05/08/2024 1605) Pt will Ambulate:  25 feet  with least restrictive assistive device  with supervision Goal: Pt Will Go Up/Down Stairs Outcome: Progressing Flowsheets (Taken 05/08/2024 1605) Pt will Go Up / Down Stairs:  3-5 stairs  with rail(s)  with supervision   4:06 PM, 05/08/24 Khushi Zupko Powell-Butler, PT, DPT [place] with [facility]

## 2024-05-09 DIAGNOSIS — J189 Pneumonia, unspecified organism: Secondary | ICD-10-CM | POA: Diagnosis not present

## 2024-05-09 DIAGNOSIS — I2510 Atherosclerotic heart disease of native coronary artery without angina pectoris: Secondary | ICD-10-CM | POA: Diagnosis not present

## 2024-05-09 DIAGNOSIS — J9621 Acute and chronic respiratory failure with hypoxia: Secondary | ICD-10-CM | POA: Diagnosis not present

## 2024-05-09 DIAGNOSIS — I5032 Chronic diastolic (congestive) heart failure: Secondary | ICD-10-CM | POA: Diagnosis not present

## 2024-05-09 LAB — CBC WITH DIFFERENTIAL/PLATELET
Abs Immature Granulocytes: 0.07 K/uL (ref 0.00–0.07)
Basophils Absolute: 0 K/uL (ref 0.0–0.1)
Basophils Relative: 0 %
Eosinophils Absolute: 0 K/uL (ref 0.0–0.5)
Eosinophils Relative: 0 %
HCT: 28.9 % — ABNORMAL LOW (ref 36.0–46.0)
Hemoglobin: 8.8 g/dL — ABNORMAL LOW (ref 12.0–15.0)
Immature Granulocytes: 1 %
Lymphocytes Relative: 12 %
Lymphs Abs: 1.1 K/uL (ref 0.7–4.0)
MCH: 33.1 pg (ref 26.0–34.0)
MCHC: 30.4 g/dL (ref 30.0–36.0)
MCV: 108.6 fL — ABNORMAL HIGH (ref 80.0–100.0)
Monocytes Absolute: 0.2 K/uL (ref 0.1–1.0)
Monocytes Relative: 2 %
Neutro Abs: 7.8 K/uL — ABNORMAL HIGH (ref 1.7–7.7)
Neutrophils Relative %: 85 %
Platelets: 376 K/uL (ref 150–400)
RBC: 2.66 MIL/uL — ABNORMAL LOW (ref 3.87–5.11)
RDW: 12.5 % (ref 11.5–15.5)
WBC: 9.1 K/uL (ref 4.0–10.5)
nRBC: 0 % (ref 0.0–0.2)

## 2024-05-09 LAB — BASIC METABOLIC PANEL WITH GFR
Anion gap: 9 (ref 5–15)
BUN: 13 mg/dL (ref 8–23)
CO2: 32 mmol/L (ref 22–32)
Calcium: 9.3 mg/dL (ref 8.9–10.3)
Chloride: 99 mmol/L (ref 98–111)
Creatinine, Ser: 0.86 mg/dL (ref 0.44–1.00)
GFR, Estimated: >60 mL/min (ref >60)
Glucose, Bld: 178 mg/dL — ABNORMAL HIGH (ref 70–99)
Potassium: 4.6 mmol/L (ref 3.5–5.1)
Sodium: 140 mmol/L (ref 135–145)

## 2024-05-09 MED ORDER — METOPROLOL SUCCINATE ER 25 MG PO TB24: 25.0000 mg | ORAL_TABLET | Freq: Two times a day (BID) | ORAL | Status: DC

## 2024-05-09 MED ORDER — METOPROLOL SUCCINATE ER 25 MG PO TB24
25.0000 mg | ORAL_TABLET | Freq: Every day | ORAL | Status: DC
  Administered 2024-05-10 – 2024-05-11 (×2): 25 mg via ORAL
  Filled 2024-05-09 (×2): qty 1

## 2024-05-09 MED ORDER — AMLODIPINE BESYLATE 5 MG PO TABS
5.0000 mg | ORAL_TABLET | Freq: Every day | ORAL | Status: DC
  Administered 2024-05-09 – 2024-05-11 (×3): 5 mg via ORAL
  Filled 2024-05-09 (×3): qty 1

## 2024-05-09 NOTE — TOC Initial Note (Signed)
 Transition of Care [facility]) - Initial/Assessment Note    Patient Details  Name: [name] MRN: [id number] Date of Birth: March 20, 1951  Transition of Care [facility]) CM/SW Contact:    Lucie Lunger, LCSWA Phone Number: 05/09/2024, 9:57 AM  Clinical Narrative:                 CSW updated that PT is recommending HH PT for pt. CSW spoke with pts son who is at bedside with pt to complete assessment. Pt lives with one of her sons. Pt is able to complete ADLs but has assistance if needed. Pts son drives her to all appointments. Pt has a cane and walker to use in the home. CSW spoke with pts son about [facility] PT recommendation, they are agreeable and do not have an agency preference. CSW spoke to [place] with [facility] who states they can accept, MD placed orders for [facility] PT. TOC to follow.   Expected Discharge Plan: Home w Home Health Services Barriers to Discharge: Continued Medical Work up   Patient Goals and CMS Choice Patient states their goals for this hospitalization and ongoing recovery are:: return home CMS Medicare.gov Compare Post Acute Care list provided to:: Patient Represenative (must comment) Choice offered to / list presented to : Patient, Adult Children      Expected Discharge Plan and Services In-house Referral: Clinical Social Work Discharge Planning Services: CM Consult Post Acute Care Choice: Home Health Living arrangements for the past 2 months: Single Family Home                           HH Arranged: PT HH Agency: [facility] Date [facility] Agency Contacted: 05/09/24   Representative spoke with at [facility] Agency: [name]  Prior Living Arrangements/Services Living arrangements for the past 2 months: Single Family Home Lives with:: Adult Children Patient language and need for interpreter reviewed:: Yes Do you feel safe going back to the place where you live?: Yes      Need for Family Participation in Patient Care: Yes (Comment) Care giver support system in place?: Yes  (comment) Current home services: DME Criminal Activity/Legal Involvement Pertinent to Current Situation/Hospitalization: No - Comment as needed  Activities of Daily Living   ADL Screening (condition at time of admission) Independently performs ADLs?: Yes (appropriate for developmental age) Is the patient deaf or have difficulty hearing?: No Does the patient have difficulty seeing, even when wearing glasses/contacts?: No Does the patient have difficulty concentrating, remembering, or making decisions?: No  Permission Sought/Granted                  Emotional Assessment Appearance:: Appears stated age Attitude/Demeanor/Rapport: Engaged Affect (typically observed): Accepting Orientation: : Oriented to Self, Oriented to Place, Oriented to  Time, Oriented to Situation Alcohol  / Substance Use: Not Applicable Psych Involvement: No (comment)  Admission diagnosis:  Acute respiratory failure with hypoxia (HCC) [J96.01] Acute and chronic respiratory failure with hypoxia (HCC) [J96.21] Patient Active Problem List   Diagnosis Date Noted   Acute and chronic respiratory failure with hypoxia (HCC) 05/06/2024   Leukocytosis 05/06/2024   CAP (community acquired pneumonia) 05/06/2024   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 10/01/2022   Chronic hypoxic respiratory failure (HCC) 10/01/2022   Protein-calorie malnutrition, severe 06/13/2022   Impaired glucose tolerance 06/02/2022   Hypernatremia 06/02/2022    Hypophosphatemia 06/01/2022   Hyperkalemia 05/31/2022   Malnutrition of moderate degree 05/30/2022   Lobar pneumonia (HCC) 05/30/2022   Elevated troponin 05/29/2022   Chronic kidney disease, stage 3a (HCC) 05/29/2022   COPD with acute exacerbation (HCC) 05/29/2022   Macrocytic anemia    Gastroesophageal reflux disease    Nausea vomiting and diarrhea    Dysphagia    Pressure injury of skin 05/03/2022   COVID-19 virus infection 05/02/2022   AKI (acute kidney injury) (HCC) 05/02/2022   Acute respiratory failure with hypoxia and hypercarbia (HCC) 05/02/2022   Hypokalemia    Calculus of gallbladder with acute cholecystitis and obstruction    Choledocholithiasis 10/09/2021   Noninfectious gastroenteritis 07/16/2014   Functional constipation 10/27/2012   Colon cancer screening 10/27/2012   History of stroke 10/26/2012   Chronic back pain 10/26/2012   Dehydration 09/07/2011   HYPERLIPIDEMIA-MIXED 03/18/2009   Essential hypertension 03/18/2009   Disorder of kidney and ureter 03/18/2009   PCP:  [name], FNP Pharmacy:   [facility] [phone] - [place], [place] - [address] AT [facility] OF [address] & [address]  [place] [address] Phone: [phone] Fax: [phone]     Social Drivers of Health (SDOH) Social History: SDOH Screenings   Food Insecurity: No Food Insecurity (05/06/2024)  Housing: Low Risk  (05/06/2024)  Transportation Needs: No Transportation Needs (05/06/2024)  Utilities: Not At Risk (05/06/2024)  Depression (PHQ2-9): Low Risk  (03/30/2024)  Social Connections: Moderately Isolated (05/06/2024)  Tobacco Use: Medium Risk (05/06/2024)   SDOH Interventions:     Readmission Risk Interventions    05/07/2024    9:32 PM 01/11/2024   10:17 AM 01/07/2024   11:02 AM  Readmission Risk Prevention Plan  Transportation Screening Complete Complete Complete  PCP or Specialist Appt within 3-5 Days Complete  Not Complete  HRI or Home  Care Consult Complete  Complete  Social Work Consult for Recovery Care Planning/Counseling Complete  Complete  Palliative Care Screening Not Applicable  Complete  Medication Review Oceanographer) Complete Complete Complete  HRI or Home Care Consult  Complete   SW Recovery Care/Counseling Consult  Complete   Palliative Care Screening  Not Applicable   Skilled Nursing Facility  Not Applicable

## 2024-05-09 NOTE — Progress Notes (Signed)
 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  She is having pain in the right upper chest area radiating into the back worse with coughing.  She is also been having fever and chills at home.  She also reports having nonproductive nocturnal coughing.  She has been having some increasing shortness of breath with exertion.  She has increasing oxygen  requirement with exertion noted in the ED to become hypoxic with ambulating and tachycardic.  Her oxygen  was increased to 4 L/min from baseline of 3 L/min with exertion.  Her chest x-ray demonstrated concern for right upper lobe infiltrates.  Her cardiac workup has been reassuring with 2 normal high-sensitivity troponin tests.  Patient being admitted for further management.   Assessment and Plan:  Acute on chronic respiratory failure with hypoxia -secondary to right upper lobe pneumonia/multifocal pneumonia -Continue supportive measures and pneumonia treatment as ordered -Increased supplemental oxygen  to 4 L/min with exertion -Hopefully can wean down to 3 L/min with treatments (which is her baseline) -- her new baseline may be 4L/min -IV Solu-Medrol  x 1 dose ordered on 8/16, repeat dose on 8/18  -Bronchodilators ordered -Continue antibiotics as ordered   Right upper lobe pneumonia -Patient presents with leukocytosis, fever and chills, cough, chest x-ray findings -IV antibiotics ordered ceftriaxone  and  doxycycline  -Mucinex  600 mg twice daily and Delsym  as needed cough symptoms -Pulmonary toilet and incentive spirometry  -Bronchodilators ordered   Chest pain, atypical -ACS ruled out with 2 normal high-sensitivity troponin tests, EKG no acute findings;  -Likely secondary to multifocal pneumonia  -GI cocktail given -Treating pneumonia as noted above -IV famotidine  ordered -Treat supportively -Scheduled acetaminophen  given pleuritic nature of pain --CXR repeated 8/18 with progressive multifocal pneumonia, intensify antibiotic treatments and continue supportive measures    Leukocytosis -Secondary to pneumonia -Anticipate leukemoid reaction from steroids -Following   Macrocytic anemia -Resume home daily folic acid  -Follow-up anemia panel testing results [ Iron 40, Ferritin 210, folate 35.5, TIBC 221 ]   GERD -Treating aggressively with IV famotidine  twice daily   Chronic HFimpEF -Continue home meds -Appears compensated -Follow clinically  Hypertension  -- has been suboptimally controlled -- added IV hydralazine  PRN  -- continue metoprolol  succinate, added amlodipine  5 mg   DVT prophylaxis: sq heparin   Code Status: Full  Family Communication: son bedside update  Disposition: anticipating home in 1-2 days    Consultants:   Procedures:   Antimicrobials:  CTX 8/17>> Doxycycline  8/17>>   Subjective: Pt says that her chest pain symptoms are better today.  She is still coughing and having some shortness of breath.    Objective: Vitals:   05/08/24 2012 05/09/24 0437 05/09/24 0754 05/09/24 1231  BP: (!) 172/90 (!) 147/65  (!) 156/85  Pulse: 85 (!) 103  98  Resp: 19 20  16   Temp: 98.2 F (36.8 C) 97.9 F (36.6 C)  98.1 F (36.7 C)  TempSrc: Oral Oral  Oral  SpO2: 98% 94% 97% 98%  Weight:      Height:  Intake/Output Summary (Last 24 hours) at 05/09/2024 1442 Last data filed at 05/09/2024 0807 Gross per 24 hour  Intake 580.79 ml  Output --  Net 580.79 ml    Filed Weights   05/06/24 0719  Weight: 54.4 kg   Examination:  General exam: Appears calm and comfortable  Respiratory system: rales heard RUL. Cardiovascular system: normal S1 & S2 heard. No JVD, murmurs, rubs, gallops or clicks. No pedal edema. Gastrointestinal system: Abdomen is nondistended, soft and nontender. No organomegaly or masses felt. Normal bowel sounds heard. Central nervous system: Alert and oriented. No focal neurological deficits. Extremities: Symmetric 5 x 5 power. Skin: No rashes, lesions or ulcers. Psychiatry: Judgement and insight appear normal. Mood & affect appropriate.   Data Reviewed: I have personally reviewed following labs and imaging studies  CBC: Recent Labs  Lab 05/06/24 0733 05/07/24 0347 05/08/24 0339 05/09/24 0425  WBC 10.6* 7.5 12.9* 9.1  NEUTROABS  --  6.3 8.8* 7.8*  HGB 9.3* 9.5* 8.6* 8.8*  HCT 30.8* 30.9* 29.2* 28.9*  MCV 108.8* 107.7* 109.4* 108.6*  PLT 392 401* 347 376    Basic Metabolic Panel: Recent Labs  Lab 05/06/24 0733 05/07/24 0347 05/08/24 0339 05/09/24 0425  NA 140 136 139 140  K 3.8 3.6 3.5 4.6  CL 97* 98 99 99  CO2 31 29 32 32  GLUCOSE 151* 228* 114* 178*  BUN 9 13 13 13   CREATININE 1.06* 0.92 0.93 0.86  CALCIUM  8.7* 9.3 8.9 9.3  MG  --  1.4* 2.2  --     CBG: No results for input(s): GLUCAP in the last 168 hours.  No results found for this or any previous visit (from the past 240 hours).   Radiology Studies: DG CHEST PORT 1 VIEW Result Date: 05/08/2024 CLINICAL DATA:  Chest pain.  History of COPD. EXAM: PORTABLE CHEST 1 VIEW COMPARISON:  05/06/2024. FINDINGS: Cardiac silhouette is normal in size and configuration. No mediastinal or left hilar masses. Right hilum is less well evaluated due to patient rotation. Right pulmonary artery appears prominent. No convincing hilar mass. There is coarse interstitial thickening, most evident in the right mid to lower lung, with additional linear and reticular areas  of lung scarring. Relative lucency is noted in in the upper lobes, most evident at the right apex, consistent with emphysema. There are additional right lung airspace opacities peripherally in the right mid to lower lung, increased when compared to the prior chest radiograph. No convincing pleural effusion.  No pneumothorax. Skeletal structures are demineralized. Old rib fractures are noted on the left. IMPRESSION: 1. Right mid to lower lung airspace opacities have increased from the prior exam suspected to be due to multifocal pneumonia. These are superimposed on chronic areas of lung scarring and changes of emphysema. Electronically Signed   By: Alm Parkins M.D.   On: 05/08/2024 11:42    Scheduled Meds:  acetaminophen   650 mg Oral Q6H   Or   acetaminophen   650 mg Rectal Q6H   aspirin  EC  81 mg Oral Daily   budesonide -glycopyrrolate -formoterol   2 puff Inhalation BID   citalopram   20 mg Oral Daily   clopidogrel   75 mg Oral Daily   folic acid   1 mg Oral Daily   guaiFENesin   600 mg Oral BID   heparin   5,000 Units Subcutaneous Q8H   metoprolol  succinate  25 mg Oral Daily   mirtazapine   7.5 mg Oral QHS   montelukast   10 mg Oral QHS   pantoprazole   40 mg Oral Daily   polyethylene glycol  17 g Oral Daily   cyanocobalamin   500 mcg Oral Daily   Continuous Infusions:  cefTRIAXone  (ROCEPHIN )  IV 2 g (05/09/24 1339)   doxycycline  (VIBRAMYCIN ) IV 100 mg (05/09/24 1044)   famotidine  (PEPCID ) IV 20 mg (05/09/24 0924)     LOS: 3 days   Time spent: 55 mins  Ryheem Jay Vicci, MD How to contact the [facility] Attending or Consulting provider 7A - 7P or covering provider during after hours 7P -7A, for this patient?  Check the care team in [facility] and look for a) attending/consulting [facility] provider listed and b) the [facility] team listed Log into [email or web address] to find provider on call.  Locate the [facility] provider you are looking for under [facility] and page to a number that you can be directly reached. If you  still have difficulty reaching the provider, please page the [facility] (Director on Call) for the Hospitalists listed on amion for assistance.  05/09/2024, 2:42 PM

## 2024-05-09 NOTE — Plan of Care (Signed)

## 2024-05-09 NOTE — Plan of Care (Signed)

## 2024-05-10 DIAGNOSIS — J181 Lobar pneumonia, unspecified organism: Secondary | ICD-10-CM

## 2024-05-10 DIAGNOSIS — J9621 Acute and chronic respiratory failure with hypoxia: Secondary | ICD-10-CM | POA: Diagnosis not present

## 2024-05-10 DIAGNOSIS — J9601 Acute respiratory failure with hypoxia: Secondary | ICD-10-CM | POA: Diagnosis not present

## 2024-05-10 DIAGNOSIS — I5032 Chronic diastolic (congestive) heart failure: Secondary | ICD-10-CM | POA: Diagnosis not present

## 2024-05-10 LAB — BASIC METABOLIC PANEL WITH GFR
Anion gap: 10 (ref 5–15)
BUN: 14 mg/dL (ref 8–23)
CO2: 29 mmol/L (ref 22–32)
Calcium: 9.2 mg/dL (ref 8.9–10.3)
Chloride: 102 mmol/L (ref 98–111)
Creatinine, Ser: 1.01 mg/dL — ABNORMAL HIGH (ref 0.44–1.00)
GFR, Estimated: 58 mL/min — ABNORMAL LOW (ref >60)
Glucose, Bld: 162 mg/dL — ABNORMAL HIGH (ref 70–99)
Potassium: 3.5 mmol/L (ref 3.5–5.1)
Sodium: 141 mmol/L (ref 135–145)

## 2024-05-10 LAB — CBC WITH DIFFERENTIAL/PLATELET
Abs Granulocyte: 9.2 K/uL — ABNORMAL HIGH (ref 1.5–6.5)
Abs Immature Granulocytes: 0.11 K/uL — ABNORMAL HIGH (ref 0.00–0.07)
Basophils Absolute: 0.1 K/uL (ref 0.0–0.1)
Basophils Relative: 0 %
Eosinophils Absolute: 0.1 K/uL (ref 0.0–0.5)
Eosinophils Relative: 1 %
HCT: 29.7 % — ABNORMAL LOW (ref 36.0–46.0)
Hemoglobin: 8.6 g/dL — ABNORMAL LOW (ref 12.0–15.0)
Immature Granulocytes: 1 %
Lymphocytes Relative: 19 %
Lymphs Abs: 2.5 K/uL (ref 0.7–4.0)
MCH: 32.6 pg (ref 26.0–34.0)
MCHC: 29 g/dL — ABNORMAL LOW (ref 30.0–36.0)
MCV: 112.5 fL — ABNORMAL HIGH (ref 80.0–100.0)
Monocytes Absolute: 1.1 K/uL — ABNORMAL HIGH (ref 0.1–1.0)
Monocytes Relative: 9 %
Neutro Abs: 9.2 K/uL — ABNORMAL HIGH (ref 1.7–7.7)
Neutrophils Relative %: 70 %
Platelets: 356 K/uL (ref 150–400)
RBC: 2.64 MIL/uL — ABNORMAL LOW (ref 3.87–5.11)
RDW: 12.6 % (ref 11.5–15.5)
Smear Review: NORMAL
WBC: 13 K/uL — ABNORMAL HIGH (ref 4.0–10.5)
nRBC: 0 % (ref 0.0–0.2)

## 2024-05-10 LAB — RESPIRATORY PANEL BY PCR

## 2024-05-10 MED ORDER — ACETAMINOPHEN 500 MG PO TABS
1000.0000 mg | ORAL_TABLET | Freq: Three times a day (TID) | ORAL | Status: DC
  Filled 2024-05-10 (×2): qty 2

## 2024-05-10 MED ORDER — IPRATROPIUM-ALBUTEROL 0.5-2.5 (3) MG/3ML IN SOLN
3.0000 mL | Freq: Three times a day (TID) | RESPIRATORY_TRACT | Status: DC
  Administered 2024-05-10 (×2): 3 mL via RESPIRATORY_TRACT
  Filled 2024-05-10 (×2): qty 3

## 2024-05-10 MED ORDER — IPRATROPIUM-ALBUTEROL 0.5-2.5 (3) MG/3ML IN SOLN
3.0000 mL | Freq: Two times a day (BID) | RESPIRATORY_TRACT | Status: DC
  Administered 2024-05-11: 3 mL via RESPIRATORY_TRACT
  Filled 2024-05-10: qty 3

## 2024-05-10 MED ORDER — ARFORMOTEROL TARTRATE 15 MCG/2ML IN NEBU
15.0000 ug | INHALATION_SOLUTION | Freq: Two times a day (BID) | RESPIRATORY_TRACT | Status: DC
  Administered 2024-05-10 – 2024-05-11 (×2): 15 ug via RESPIRATORY_TRACT
  Filled 2024-05-10 (×2): qty 2

## 2024-05-10 MED ORDER — FAMOTIDINE 20 MG PO TABS: 10.0000 mg | ORAL_TABLET | Freq: Every day | ORAL | Status: DC

## 2024-05-10 MED ORDER — BUDESONIDE 0.5 MG/2ML IN SUSP
0.5000 mg | Freq: Two times a day (BID) | RESPIRATORY_TRACT | Status: DC
  Administered 2024-05-10 – 2024-05-11 (×2): 0.5 mg via RESPIRATORY_TRACT
  Filled 2024-05-10 (×2): qty 2

## 2024-05-10 NOTE — Progress Notes (Signed)
 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  She is having pain in the right upper chest area radiating into the back worse with coughing.  She is also been having fever and chills at home.  She also reports having nonproductive nocturnal coughing.  She has been having some increasing shortness of breath with exertion.  She has increasing oxygen  requirement with exertion noted in the ED to become hypoxic with ambulating and tachycardic.  Her oxygen  was increased to 4 L/min from baseline of 3 L/min with exertion.  Her chest x-ray demonstrated concern for right upper lobe infiltrates.  Her cardiac workup has been reassuring with 2 normal high-sensitivity troponin tests.  Patient being admitted for further management.   Assessment/Plan:  Acute on chronic respiratory failure with hypoxia -secondary to right upper lobe pneumonia/multifocal pneumonia -Continue supportive measures and pneumonia treatment as ordered -Increased supplemental oxygen  to 4 L/min with exertion -Hopefully can wean down to 3 L/min with treatments (which is her baseline)  -IV Solu-Medrol  x 1 dose ordered on 8/16, repeat dose on 8/18  -start pulmicort , brovana , duonebs --viral resp panel--neg   Lobar pneumonia -personally reviewed CXR--RML, RLL opacity -continue ceftriaxone  and doxycycline  -Mucinex  600 mg twice daily  --continue BDs   Chest pain, atypical -ACS ruled out  -- troponin 21>>19 -GI cocktail given -Treating pneumonia as noted above -pantoprazole  -Scheduled  acetaminophen  given pleuritic nature of pain  Impaired glucose tolerance --Started ISS --03/30/24 A1C--5.8 --05/07/22 A1C--5.4   Macrocytic anemia/B12 and folic acid  deficiency -Resume home daily folic acid  and B12   GERD -continue pantoprazole    Chronic HFimpEF --01/12/24 Echo EF 60-65%, normal RVF ---05/29/22 echo--EF 35-40%, apical HK, trivial MR/TR -05/31/22--repeat limited Echo-EF 35-40%, unchanged -Appears compensated   Hypertension  -- continue metoprolol  succinate, added amlodipine  5 mg           Family Communication:   son at bedside 8/20  Consultants:  none  Code Status:  FULL   DVT Prophylaxis:  [place] Heparin    Procedures: As Listed in Progress Note Above  Antibiotics: Ceftriaxone  8/16>> Doxy 8/16>>     Subjective: Patient still has a little shortness of breath but it is better than the day of admission.  She denies any chest pain, nausea, vomiting, diarrhea, abdominal pain.  Objective: Vitals:   05/10/24 0801 05/10/24 1253 05/10/24 1436 05/10/24 1524  BP:  (!) 114/44    Pulse:  92    Resp:  16    Temp:  98.5 F (36.9 C)    TempSrc:  Oral    SpO2: 90% 99% 99% 99%  Weight:      Height:        Intake/Output Summary (Last 24 hours) at 05/10/2024 1816 Last data filed at 05/10/2024 0331 Gross per 24 hour  Intake 275 ml  Output --  Net 275 ml   Weight change:  Exam:  General:  Pt is alert, follows commands appropriately, not in acute distress HEENT: No icterus, No thrush, No neck mass, [place]/AT Cardiovascular: RRR, S1/S2, no rubs, no gallops Respiratory: Diminished breath sounds.  Bibasilar rales.  No wheezing. Abdomen: Soft/+BS, non tender, non distended, no guarding Extremities: No edema, No lymphangitis, No petechiae, No rashes, no synovitis   Data Reviewed: I have personally reviewed following labs and imaging studies Basic Metabolic Panel: Recent Labs  Lab 05/06/24 0733 05/07/24 0347 05/08/24 0339 05/09/24 0425 05/10/24 0413  NA  140 136 139 140 141  K 3.8 3.6 3.5 4.6 3.5  CL 97* 98 99 99 102  CO2 31 29 32 32 29  GLUCOSE 151* 228* 114* 178* 162*  BUN 9 13 13 13 14   CREATININE 1.06* 0.92 0.93 0.86 1.01*  CALCIUM  8.7* 9.3 8.9 9.3 9.2  MG  --  1.4* 2.2  --   --    Liver Function Tests: No results for input(s): AST, ALT, ALKPHOS, BILITOT, PROT, ALBUMIN in the last 168 hours. No results for input(s): LIPASE, AMYLASE in the last 168 hours. No results for input(s): AMMONIA in the last 168 hours. Coagulation Profile: No results for input(s): INR, PROTIME in the last 168 hours. CBC: Recent Labs  Lab 05/06/24 0733 05/07/24 0347 05/08/24 0339 05/09/24 0425 05/10/24 0413  WBC 10.6* 7.5 12.9* 9.1 13.0*  NEUTROABS  --  6.3 8.8* 7.8* 9.2*  HGB 9.3* 9.5* 8.6* 8.8* 8.6*  HCT 30.8* 30.9* 29.2* 28.9* 29.7*  MCV 108.8* 107.7* 109.4* 108.6* 112.5*  PLT 392 401* 347 376 356   Cardiac Enzymes: No results for input(s): CKTOTAL, CKMB, CKMBINDEX, TROPONINI in the last 168 hours. BNP: Invalid input(s): POCBNP CBG: No results for input(s): GLUCAP in the last 168 hours. HbA1C: No results for input(s): HGBA1C in the last 72 hours. Urine analysis:    Component Value Date/Time   COLORURINE YELLOW 01/07/2024 0130   APPEARANCEUR HAZY (A) 01/07/2024 0130   LABSPEC 1.013 01/07/2024 0130   PHURINE 5.0 01/07/2024 0130   GLUCOSEU NEGATIVE 01/07/2024 0130   HGBUR NEGATIVE 01/07/2024 0130   BILIRUBINUR NEGATIVE 01/07/2024 0130   KETONESUR NEGATIVE 01/07/2024 0130   PROTEINUR 30 (A) 01/07/2024 0130   UROBILINOGEN 1.0 07/16/2014 0244   NITRITE NEGATIVE 01/07/2024 0130   LEUKOCYTESUR NEGATIVE 01/07/2024 0130   Sepsis Labs: @LABRCNTIP (procalcitonin:4,lacticidven:4) ) Recent Results (from the past 240 hours)  Respiratory (~20 pathogens) panel by PCR     Status: None   Collection Time: 05/10/24 12:16 PM   Specimen: Nasopharyngeal Swab; Respiratory  Result Value Ref Range Status   Adenovirus  NOT DETECTED NOT DETECTED Final   Coronavirus 229E NOT DETECTED NOT DETECTED Final    Comment: (NOTE) The Coronavirus on the Respiratory Panel, DOES NOT test for the novel  Coronavirus (2019 nCoV)    Coronavirus HKU1 NOT DETECTED NOT DETECTED Final   Coronavirus NL63 NOT DETECTED NOT DETECTED Final   Coronavirus OC43 NOT DETECTED NOT DETECTED Final   Metapneumovirus NOT DETECTED NOT DETECTED Final   Rhinovirus / Enterovirus NOT DETECTED NOT DETECTED Final   Influenza A NOT DETECTED NOT DETECTED Final   Influenza B NOT DETECTED NOT DETECTED Final   Parainfluenza Virus 1 NOT DETECTED NOT DETECTED Final   Parainfluenza Virus 2 NOT DETECTED NOT DETECTED Final   Parainfluenza Virus 3 NOT DETECTED NOT DETECTED Final   Parainfluenza Virus 4 NOT DETECTED NOT DETECTED Final   Respiratory Syncytial Virus NOT DETECTED NOT DETECTED Final   Bordetella pertussis NOT DETECTED NOT DETECTED Final   Bordetella Parapertussis NOT DETECTED NOT DETECTED Final   Chlamydophila pneumoniae NOT DETECTED NOT DETECTED Final   Mycoplasma pneumoniae NOT DETECTED NOT DETECTED Final    Comment: Performed at [facility]  [facility], [address] [place], [place] [address]     Scheduled Meds:  acetaminophen   1,000 mg Oral Q8H   amLODipine   5 mg Oral Daily   arformoterol   15 mcg Nebulization BID   aspirin  EC  81 mg Oral Daily   budesonide  (PULMICORT ) nebulizer solution  0.5 mg Nebulization BID   citalopram   20 mg Oral Daily   clopidogrel   75 mg Oral Daily   folic acid   1 mg Oral Daily   guaiFENesin   600 mg Oral BID   heparin   5,000 Units Subcutaneous Q8H   ipratropium-albuterol   3 mL Nebulization Q8H   metoprolol  succinate  25 mg Oral Daily   montelukast   10 mg Oral QHS   pantoprazole   40 mg Oral Daily   polyethylene glycol  17 g Oral Daily   cyanocobalamin   500 mcg Oral Daily   Continuous Infusions:  doxycycline  (VIBRAMYCIN ) IV 100 mg (05/10/24 0858)    Procedures/Studies: DG CHEST PORT 1 VIEW Result  Date: 05/08/2024 CLINICAL DATA:  Chest pain.  History of COPD. EXAM: PORTABLE CHEST 1 VIEW COMPARISON:  05/06/2024. FINDINGS: Cardiac silhouette is normal in size and configuration. No mediastinal or left hilar masses. Right hilum is less well evaluated due to patient rotation. Right pulmonary artery appears prominent. No convincing hilar mass. There is coarse interstitial thickening, most evident in the right mid to lower lung, with additional linear and reticular areas of lung scarring. Relative lucency is noted in in the upper lobes, most evident at the right apex, consistent with emphysema. There are additional right lung airspace opacities peripherally in the right mid to lower lung, increased when compared to the prior chest radiograph. No convincing pleural effusion.  No pneumothorax. Skeletal structures are demineralized. Old rib fractures are noted on the left. IMPRESSION: 1. Right mid to lower lung airspace opacities have increased from the prior exam suspected to be due to multifocal pneumonia. These are superimposed on chronic areas of lung scarring and changes of emphysema. Electronically Signed   By: Alm Parkins M.D.   On: 05/08/2024 11:42   DG Chest Port 1 View Result Date: 05/06/2024 EXAM: 1 VIEW XRAY OF THE CHEST 05/06/2024 07:47:00 AM COMPARISON: 01/10/2024 CLINICAL HISTORY: Cough, CP. Per chart: Pt c/o left-sided CP radiating to neck since earlier this morning. Pt reports sweating also. Rated 8/10. 2 prior stents. 3L O2 chronic; O2 saturation mid-80s on 3L. O2 saturation increased to 97% on 4L nasal cannula. FINDINGS: LUNGS AND PLEURA: New peripheral opacities within the right upper lobe may represent atelectasis or airspace disease. No pleural effusion or pneumothorax. Advanced changes of COPD/emphysema. HEART AND MEDIASTINUM: No acute abnormality of the cardiac and mediastinal silhouettes. Aortic atherosclerotic calcification. BONES AND SOFT TISSUES: No acute osseous abnormality. IMPRESSION:  1. New peripheral opacities within the right upper lobe, possibly representing atelectasis or airspace disease. 2. Advanced changes of COPD/emphysema. 3. Aortic atherosclerotic calcification. Electronically signed by: Waddell Calk MD 05/06/2024 07:53 AM EDT RP Workstation: [id number]    Alm Schneider, DO  [facility]  If 7PM-7AM, please contact night-coverage [email or web address] Password [email or web address] 05/10/2024, 6:16 PM   LOS: 4 days

## 2024-05-10 NOTE — Progress Notes (Signed)
 Attempted to collect MRSA pcr from patient , she swatted her hand at me and did not want to be bothered at this time. Notified Dr. Evonnie, covered patient up with blanket and left room. Family was at bedside to visit however patient was asleep and has been asleep for most of this shift,

## 2024-05-10 NOTE — Progress Notes (Signed)
 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  She is having pain in the right upper chest area radiating into the back worse with coughing.  She is also been having fever and chills at home.  She also reports having nonproductive nocturnal coughing.  She has been having some increasing shortness of breath with exertion.  She has increasing oxygen  requirement with exertion noted in the ED to become hypoxic with ambulating and tachycardic.  Her oxygen  was increased to 4 L/min from baseline of 3 L/min with exertion.  Her chest x-ray demonstrated concern for right upper lobe infiltrates.  Her cardiac workup has been reassuring with 2 normal high-sensitivity troponin tests.  Patient being admitted for further management.    PT Comments  Pt reports she has been in and out of bed all day.  Pt present in bed upon entrance and encouraged to particiate with therapy today.  Pt c/o chest pain that was monitored through session.  Pt independent with bed mobility and SBA with transfer for safety, able to complete both with increased time and slow labored movements.  Able to increased distance with gait with no LOB, slow labored gait.  EOS pt requested to return to bed.  Call bell within reach.    If plan is discharge home, recommend the following:     Can travel by private vehicle        Equipment Recommendations       Recommendations for Other Services       Precautions / Restrictions Precautions Precautions: Fall Recall of  Precautions/Restrictions: Intact     Mobility  Bed Mobility Overal bed mobility: Independent Bed Mobility: Supine to Sit     Supine to sit: Modified independent (Device/Increase time), HOB elevated Sit to supine: Modified independent (Device/Increase time), HOB elevated   General bed mobility comments: HOB elevated, inc time needed secondary to inc fatigue and pain    Transfers Overall transfer level: Modified independent Equipment used: None Transfers: Sit to/from Stand Sit to Stand: Min assist           General transfer comment: Pt demo slow movement due to fatigue and inc pain.    Ambulation/Gait Ambulation/Gait assistance: Contact guard assist Gait Distance (Feet): 24 Feet Assistive device: 1 person hand held assist, None Gait Pattern/deviations: Step-through pattern, Decreased step length - right, Decreased step length - left, Trunk flexed Gait velocity: Dec     General Gait Details: Pt demo slow labored movement. Initial steps with 1 person hand held assist on pt LUE.  Pt mildly unsteady but mostly limited due to SOB, fatigue, and pain.   Stairs             Wheelchair Mobility     Tilt Bed    Modified Rankin (Stroke Patients Only)       Balance                                            Communication Communication Communication: No  apparent difficulties  Cognition Arousal: Alert Behavior During Therapy: WFL for tasks assessed/performed   PT - Cognitive impairments: No apparent impairments                                Cueing Cueing Techniques: Verbal cues, Tactile cues, Visual cues  Exercises      General Comments        Pertinent Vitals/Pain Pain Assessment Pain Assessment: 0-10 Pain Score: 6  Pain Location: L Chest pain Pain Descriptors / Indicators: Discomfort, Sharp Pain Intervention(s): Limited activity within patient's tolerance, Monitored during session    Home Living                           Prior Function            PT Goals (current goals can now be found in the care plan section)      Frequency           PT Plan      Co-evaluation              AM-PAC PT 6 Clicks Mobility   Outcome Measure  Help needed turning from your back to your side while in a flat bed without using bedrails?: None Help needed moving from lying on your back to sitting on the side of a flat bed without using bedrails?: None Help needed moving to and from a bed to a chair (including a wheelchair)?: A Little Help needed standing up from a chair using your arms (e.g., wheelchair or bedside chair)?: A Little Help needed to walk in hospital room?: A Little Help needed climbing 3-5 steps with a railing? : A Lot 6 Click Score: 19    End of Session Equipment Utilized During Treatment: Gait belt Activity Tolerance: Patient limited by pain;Patient limited by fatigue Patient left: in bed;with call bell/phone within reach Nurse Communication: Mobility status;Other (comment) (RN aware of chest pain) PT Visit Diagnosis: Pain;Muscle weakness (generalized) (M62.81);History of falling (Z91.81);Unsteadiness on feet (R26.81);Other abnormalities of gait and mobility (R26.89)     Time: 8587-8574 PT Time Calculation (min) (ACUTE ONLY): 13 min  Charges:    $Therapeutic Activity: 8-22 mins PT General Charges $$ ACUTE PT VISIT: 1 Visit                     Augustin Mclean, LPTA/CLT; CBIS [phone]  Mclean Augustin Amble 05/10/2024, 4:30 PM

## 2024-05-11 DIAGNOSIS — J181 Lobar pneumonia, unspecified organism: Secondary | ICD-10-CM | POA: Diagnosis not present

## 2024-05-11 DIAGNOSIS — J449 Chronic obstructive pulmonary disease, unspecified: Secondary | ICD-10-CM | POA: Diagnosis not present

## 2024-05-11 DIAGNOSIS — J9621 Acute and chronic respiratory failure with hypoxia: Secondary | ICD-10-CM | POA: Diagnosis not present

## 2024-05-11 LAB — CBC WITH DIFFERENTIAL/PLATELET
Abs Granulocyte: 7.7 K/uL — ABNORMAL HIGH (ref 1.5–6.5)
Abs Immature Granulocytes: 0.07 K/uL (ref 0.00–0.07)
Basophils Absolute: 0.1 K/uL (ref 0.0–0.1)
Basophils Relative: 1 %
Eosinophils Absolute: 0.2 K/uL (ref 0.0–0.5)
Eosinophils Relative: 2 %
HCT: 30 % — ABNORMAL LOW (ref 36.0–46.0)
Hemoglobin: 8.9 g/dL — ABNORMAL LOW (ref 12.0–15.0)
Immature Granulocytes: 1 %
Lymphocytes Relative: 21 %
Lymphs Abs: 2.3 K/uL (ref 0.7–4.0)
MCH: 32.8 pg (ref 26.0–34.0)
MCHC: 29.7 g/dL — ABNORMAL LOW (ref 30.0–36.0)
MCV: 110.7 fL — ABNORMAL HIGH (ref 80.0–100.0)
Monocytes Absolute: 0.9 K/uL (ref 0.1–1.0)
Monocytes Relative: 8 %
Neutro Abs: 7.7 K/uL (ref 1.7–7.7)
Neutrophils Relative %: 67 %
Platelets: 367 K/uL (ref 150–400)
RBC: 2.71 MIL/uL — ABNORMAL LOW (ref 3.87–5.11)
RDW: 12.8 % (ref 11.5–15.5)
Smear Review: NORMAL
WBC: 11.3 K/uL — ABNORMAL HIGH (ref 4.0–10.5)
nRBC: 0 % (ref 0.0–0.2)

## 2024-05-11 LAB — BASIC METABOLIC PANEL WITH GFR
Anion gap: 9 (ref 5–15)
BUN: 11 mg/dL (ref 8–23)
CO2: 30 mmol/L (ref 22–32)
Calcium: 9.1 mg/dL (ref 8.9–10.3)
Chloride: 105 mmol/L (ref 98–111)
Creatinine, Ser: 0.84 mg/dL (ref 0.44–1.00)
GFR, Estimated: >60 mL/min (ref >60)
Glucose, Bld: 117 mg/dL — ABNORMAL HIGH (ref 70–99)
Potassium: 4.3 mmol/L (ref 3.5–5.1)
Sodium: 144 mmol/L (ref 135–145)

## 2024-05-11 LAB — BLOOD GAS, VENOUS
Acid-Base Excess: 5.7 mmol/L — ABNORMAL HIGH (ref 0.0–2.0)
Bicarbonate: 34 mmol/L — ABNORMAL HIGH (ref 20.0–28.0)
Drawn by: 53361
O2 Saturation: 56.2 %
Patient temperature: 36.7
pCO2, Ven: 65 mmHg — ABNORMAL HIGH (ref 44–60)
pH, Ven: 7.32 (ref 7.25–7.43)
pO2, Ven: 32 mmHg (ref 32–45)

## 2024-05-11 LAB — MAGNESIUM: Magnesium: 1.6 mg/dL — ABNORMAL LOW (ref 1.7–2.4)

## 2024-05-11 MED ORDER — MAGNESIUM SULFATE 2 GM/50ML IV SOLN: 2.0000 g | Freq: Once | INTRAVENOUS | Status: DC

## 2024-05-11 MED ORDER — DOXYCYCLINE HYCLATE 100 MG PO TABS: 100.0000 mg | ORAL_TABLET | Freq: Two times a day (BID) | ORAL | 0 refills | Status: DC

## 2024-05-11 MED ORDER — AMLODIPINE BESYLATE 5 MG PO TABS: 5.0000 mg | ORAL_TABLET | Freq: Every day | ORAL | 1 refills | Status: AC

## 2024-05-11 MED ORDER — DOXYCYCLINE HYCLATE 100 MG PO TABS: 100.0000 mg | ORAL_TABLET | Freq: Two times a day (BID) | ORAL | Status: DC

## 2024-05-11 MED ORDER — CEFDINIR 300 MG PO CAPS: 300.0000 mg | ORAL_CAPSULE | Freq: Two times a day (BID) | ORAL | 0 refills | Status: DC

## 2024-05-11 NOTE — Discharge Summary (Signed)
 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  She is having pain in the right upper chest area radiating into the back worse with coughing.  She is also been having fever and chills at home.  She also reports having nonproductive nocturnal coughing.  She has been having some increasing shortness of breath with exertion.  She has increasing oxygen  requirement with exertion noted in the ED to become hypoxic with ambulating and tachycardic.  Her oxygen  was increased to 4 L/min from baseline of 3 L/min with exertion.  Her chest x-ray demonstrated concern for right upper lobe infiltrates.  Her cardiac workup has been reassuring with 2 normal high-sensitivity troponin tests.  Patient being admitted for further management.   Assessment and Plan:  Acute on chronic respiratory failure with hypoxia -secondary to right upper lobe pneumonia/multifocal pneumonia -Continue supportive measures and pneumonia treatment as ordered -Increased supplemental oxygen  to 4 L/min with exertion -weaned back to 3L at time of dc(which is her baseline)  -IV Solu-Medrol  x 1 dose ordered on 8/16, repeat dose on 8/18  -started pulmicort , brovana , duonebs during hospitalization --viral resp panel--neg   Lobar pneumonia -personally  reviewed CXR--RML, RLL opacity -continue ceftriaxone  and doxycycline  -Mucinex  600 mg twice daily  --continue BDs - d/c home with cefdinir  and doxy x 2 more days   Chest pain, atypical -ACS ruled out  -- troponin 21>>19 -GI cocktail given -Treating pneumonia as noted above -pantoprazole  -Scheduled acetaminophen  given pleuritic nature of pain   Impaired glucose tolerance --Started ISS --03/30/24 A1C--5.8 --05/07/22 A1C--5.4   Macrocytic anemia/B12 and folic acid  deficiency -Resume home daily folic acid  and B12   GERD -continue pantoprazole    Chronic HFimpEF --01/12/24 Echo EF 60-65%, normal RVF ---05/29/22 echo--EF 35-40%, apical HK, trivial MR/TR -05/31/22--repeat limited Echo-EF 35-40%, unchanged -clinically compensated   Hypertension  -- continue metoprolol  succinate, added amlodipine  5 mg  - restart home losartain           Consultants: none Procedures performed: none  Disposition: Home Diet recommendation:  Cardiac diet DISCHARGE MEDICATION: Allergies as of 05/11/2024       Reactions   Aspirin  Swelling, Other (See Comments)   Only in large doses will cause a reaction   Bee Venom Swelling   Severe swelling   Contrast Media [iodinated Contrast Media] Swelling   Lisinopril Swelling   Motrin [ibuprofen] Swelling   Penicillins Anaphylaxis   Tolerated Rocephin  Jan 2025   Dilaudid  [hydromorphone  Hcl] Itching        Medication List     STOP taking these medications    ondansetron  4 MG tablet Commonly known as: Zofran    oxyCODONE -acetaminophen  5-325 MG tablet Commonly known as: Percocet   rosuvastatin  40 MG tablet Commonly known as: CRESTOR    traMADol  50 MG tablet Commonly known as: ULTRAM        TAKE  these medications    acetaminophen  650 MG CR tablet Commonly known as: TYLENOL  Take 1,300 mg by mouth every 8 (eight) hours as needed for pain.   amLODipine  5 MG tablet Commonly known as: NORVASC  Take 1 tablet (5 mg total) by mouth  daily. Start taking on: May 12, 2024   aspirin  EC 81 MG tablet Take 81 mg by mouth daily. Swallow whole.   cefdinir  300 MG capsule Commonly known as: OMNICEF  Take 1 capsule (300 mg total) by mouth 2 (two) times daily.   citalopram  20 MG tablet Commonly known as: CELEXA  Take 20 mg by mouth daily.   clopidogrel  75 MG tablet Commonly known as: PLAVIX  Take 1 tablet (75 mg total) by mouth daily.   cyanocobalamin  500 MCG tablet Commonly known as: VITAMIN B12 Take 1 tablet (500 mcg total) by mouth daily.   doxycycline  100 MG tablet Commonly known as: VIBRA -TABS Take 1 tablet (100 mg total) by mouth every 12 (twelve) hours.   fluticasone  50 MCG/ACT nasal spray Commonly known as: FLONASE  Place 1 spray into both nostrils daily as needed for rhinitis or allergies.   folic acid  1 MG tablet Commonly known as: FOLVITE  Take 1 tablet (1 mg total) by mouth daily.   Ipratropium-Albuterol  20-100 MCG/ACT Aers respimat Commonly known as: COMBIVENT  Inhale 1 puff into the lungs every 6 (six) hours as needed for wheezing or shortness of breath.   losartan  25 MG tablet Commonly known as: COZAAR  Take 12.5 mg by mouth daily.   metoprolol  succinate 50 MG 24 hr tablet Commonly known as: Toprol  XL Take 1 tablet (50 mg total) by mouth daily. Take with or immediately following a meal. What changed: how much to take   mirtazapine  7.5 MG tablet Commonly known as: REMERON  Take 7.5 mg by mouth at bedtime.   montelukast  10 MG tablet Commonly known as: SINGULAIR  Take 10 mg by mouth at bedtime.   pantoprazole  40 MG tablet Commonly known as: PROTONIX  Take 40 mg by mouth daily.   Stiolto Respimat  2.5-2.5 MCG/ACT Aers Generic drug: Tiotropium Bromide-Olodaterol Inhale 2 sprays into the lungs daily.        Follow-up Information     [facility] Follow up.   Specialty: Home Health Services Why: Agency will call to set up first home visit. Contact information: [address] [place] [address] [phone]                Discharge Exam: [name]   05/06/24 0719  Weight: 54.4 kg   HEENT:  [name]/AT, No thrush, no icterus CV:  RRR, no rub, no S3, no S4 Lung:  left sided rales > Right  No wheeze Abd:  soft/+BS, NT Ext:  No edema, no lymphangitis, no synovitis, no rash   Condition at discharge: stable  The results of significant diagnostics from this hospitalization (including imaging, microbiology, ancillary and laboratory) are listed below for reference.   Imaging Studies: DG CHEST PORT 1 VIEW Result Date: 05/08/2024 CLINICAL DATA:  Chest pain.  History of COPD. EXAM: PORTABLE CHEST 1 VIEW COMPARISON:  05/06/2024. FINDINGS: Cardiac silhouette is normal in size and configuration. No mediastinal or left hilar masses. Right hilum is less well evaluated due to patient rotation. Right pulmonary artery appears prominent. No convincing hilar mass. There is coarse interstitial thickening, most evident in the right mid to lower lung, with additional linear and reticular areas of lung scarring. Relative lucency is noted in in the upper lobes, most evident at  the right apex, consistent with emphysema. There are additional right lung airspace opacities peripherally in the right mid to lower lung, increased when compared to the prior chest radiograph. No convincing pleural effusion.  No pneumothorax. Skeletal structures are demineralized. Old rib fractures are noted on the left. IMPRESSION: 1. Right mid to lower lung airspace opacities have increased from the prior exam suspected to be due to multifocal pneumonia. These are superimposed on chronic areas of lung scarring and changes of emphysema. Electronically Signed   By: April Parkins M.D.   On: 05/08/2024 11:42   DG Chest Port 1 View Result Date: 05/06/2024 EXAM: 1 VIEW XRAY OF THE CHEST 05/06/2024 07:47:00 AM COMPARISON: 01/10/2024 CLINICAL HISTORY: Cough, CP. Per chart: Pt c/o left-sided CP radiating  to neck since earlier this morning. Pt reports sweating also. Rated 8/10. 2 prior stents. 3L O2 chronic; O2 saturation mid-80s on 3L. O2 saturation increased to 97% on 4L nasal cannula. FINDINGS: LUNGS AND PLEURA: New peripheral opacities within the right upper lobe may represent atelectasis or airspace disease. No pleural effusion or pneumothorax. Advanced changes of COPD/emphysema. HEART AND MEDIASTINUM: No acute abnormality of the cardiac and mediastinal silhouettes. Aortic atherosclerotic calcification. BONES AND SOFT TISSUES: No acute osseous abnormality. IMPRESSION: 1. New peripheral opacities within the right upper lobe, possibly representing atelectasis or airspace disease. 2. Advanced changes of COPD/emphysema. 3. Aortic atherosclerotic calcification. Electronically signed by: Waddell Calk MD 05/06/2024 07:53 AM EDT RP Workstation: [id number]    Microbiology: Results for orders placed or performed during the hospital encounter of 05/06/24  Respiratory (~20 pathogens) panel by PCR     Status: None   Collection Time: 05/10/24 12:16 PM   Specimen: Nasopharyngeal Swab; Respiratory  Result Value Ref Range Status   Adenovirus NOT DETECTED NOT DETECTED Final   Coronavirus 229E NOT DETECTED NOT DETECTED Final    Comment: (NOTE) The Coronavirus on the Respiratory Panel, DOES NOT test for the novel  Coronavirus (2019 nCoV)    Coronavirus HKU1 NOT DETECTED NOT DETECTED Final   Coronavirus NL63 NOT DETECTED NOT DETECTED Final   Coronavirus OC43 NOT DETECTED NOT DETECTED Final   Metapneumovirus NOT DETECTED NOT DETECTED Final   Rhinovirus / Enterovirus NOT DETECTED NOT DETECTED Final   Influenza A NOT DETECTED NOT DETECTED Final   Influenza B NOT DETECTED NOT DETECTED Final   Parainfluenza Virus 1 NOT DETECTED NOT DETECTED Final   Parainfluenza Virus 2 NOT DETECTED NOT DETECTED Final   Parainfluenza Virus 3 NOT DETECTED NOT DETECTED Final   Parainfluenza Virus 4 NOT DETECTED NOT DETECTED Final    Respiratory Syncytial Virus NOT DETECTED NOT DETECTED Final   Bordetella pertussis NOT DETECTED NOT DETECTED Final   Bordetella Parapertussis NOT DETECTED NOT DETECTED Final   Chlamydophila pneumoniae NOT DETECTED NOT DETECTED Final   Mycoplasma pneumoniae NOT DETECTED NOT DETECTED Final    Comment: Performed at [facility], [address] [place], [place] [address]    Labs: CBC: Recent Labs  Lab 05/07/24 [phone] 05/08/24 0339 05/09/24 0425 05/10/24 0413 05/11/24 0513  WBC 7.5 12.9* 9.1 13.0* 11.3*  NEUTROABS 6.3 8.8* 7.8* 9.2* 7.7  HGB 9.5* 8.6* 8.8* 8.6* 8.9*  HCT 30.9* 29.2* 28.9* 29.7* 30.0*  MCV 107.7* 109.4* 108.6* 112.5* 110.7*  PLT 401* 347 376 356 367   Basic Metabolic Panel: Recent Labs  Lab 05/07/24 0347 05/08/24 0339 05/09/24 0425 05/10/24 0413 05/11/24 0513  NA 136 139 140 141 144  K 3.6 3.5 4.6 3.5 4.3  CL 98 99 99 102 105  CO2 29 32 32 29 30  GLUCOSE 228* 114* 178* 162* 117*  BUN 13 13 13 14 11   CREATININE 0.92 0.93 0.86 1.01* 0.84  CALCIUM  9.3 8.9 9.3 9.2 9.1  MG 1.4* 2.2  --   --  1.6*   Liver Function Tests: No results for input(s): AST, ALT, ALKPHOS, BILITOT, PROT, ALBUMIN in the last 168 hours. CBG: No results for input(s): GLUCAP in the last 168 hours.  Discharge time spent: greater than 30 minutes.  Signed: Alm Schneider, MD [facility] 05/11/2024

## 2024-05-11 NOTE — Plan of Care (Signed)
  Problem: Education: Goal: Knowledge of General Education information will improve Description: Including pain rating scale, medication(s)/side effects and non-pharmacologic comfort measures Outcome: Adequate for Discharge   Problem: Health Behavior/Discharge Planning: Goal: Ability to manage health-related needs will improve Outcome: Adequate for Discharge   Problem: Clinical Measurements: Goal: Ability to maintain clinical measurements within normal limits will improve Outcome: Adequate for Discharge Goal: Will remain free from infection Outcome: Adequate for Discharge Goal: Diagnostic test results will improve Outcome: Adequate for Discharge Goal: Respiratory complications will improve Outcome: Adequate for Discharge Goal: Cardiovascular complication will be avoided Outcome: Adequate for Discharge   Problem: Activity: Goal: Risk for activity intolerance will decrease Outcome: Adequate for Discharge   Problem: Nutrition: Goal: Adequate nutrition will be maintained Outcome: Adequate for Discharge   Problem: Coping: Goal: Level of anxiety will decrease Outcome: Adequate for Discharge   Problem: Elimination: Goal: Will not experience complications related to bowel motility Outcome: Adequate for Discharge Goal: Will not experience complications related to urinary retention Outcome: Adequate for Discharge   Problem: Pain Managment: Goal: General experience of comfort will improve and/or be controlled Outcome: Adequate for Discharge   Problem: Safety: Goal: Ability to remain free from injury will improve Outcome: Adequate for Discharge   Problem: Skin Integrity: Goal: Risk for impaired skin integrity will decrease Outcome: Adequate for Discharge   Problem: Acute Rehab PT Goals(only PT should resolve) Goal: Pt Will Go Supine/Side To Sit Outcome: Adequate for Discharge Goal: Patient Will Transfer Sit To/From Stand Outcome: Adequate for Discharge Goal: Pt Will  Transfer Bed To Chair/Chair To Bed Outcome: Adequate for Discharge Goal: Pt Will Ambulate Outcome: Adequate for Discharge Goal: Pt Will Go Up/Down Stairs Outcome: Adequate for Discharge

## 2024-05-11 NOTE — TOC Transition Note (Signed)
 Transition of Care [facility]) - Discharge Note   Patient Details  Name: [name] MRN: [id number] Date of Birth: December 15, 1950  [facility] CM/SW Contact:  Lucie Lunger, LCSWA Phone Number: 05/11/2024, 9:37 AM  Clinical Narrative:    CSW updated that pt will D/C home today. CSW updated [name] with [facility] of plan for D/C home today, HH orders have been placed for pt by MD. [facility] agency info added to AVS for pt and daughter to review. TOC signing off.   Final next level of care: Home w Home Health Services Barriers to Discharge: Barriers Resolved   Patient Goals and CMS Choice Patient states their goals for this hospitalization and ongoing recovery are:: return home CMS Medicare.gov Compare Post Acute Care list provided to:: Patient Represenative (must comment) Choice offered to / list presented to : Patient, Adult Children      Discharge Placement                       Discharge Plan and Services Additional resources added to the After Visit Summary for   In-house Referral: Clinical Social Work Discharge Planning Services: CM Consult Post Acute Care Choice: Home Health                    HH Arranged: PT [facility] Agency: [facility] Date [facility] Agency Contacted: 05/11/24   Representative spoke with at [facility] Agency: [name]  Social Drivers of Health (SDOH) Interventions SDOH Screenings   Food Insecurity: No Food Insecurity (05/06/2024)  Housing: Low Risk  (05/06/2024)  Transportation Needs: No Transportation Needs (05/06/2024)  Utilities: Not At Risk (05/06/2024)  Depression (PHQ2-9): Low Risk  (03/30/2024)  Social Connections: Moderately Isolated (05/06/2024)  Tobacco Use: Medium Risk (05/06/2024)     Readmission Risk Interventions    05/07/2024    9:32 PM 01/11/2024   10:17 AM 01/07/2024   11:02 AM  Readmission Risk Prevention Plan  Transportation Screening Complete Complete Complete  PCP or Specialist Appt within 3-5 Days Complete  Not Complete  HRI  or Home Care Consult Complete  Complete  Social Work Consult for Recovery Care Planning/Counseling Complete  Complete  Palliative Care Screening Not Applicable  Complete  Medication Review Oceanographer) Complete Complete Complete  HRI or Home Care Consult  Complete   SW Recovery Care/Counseling Consult  Complete   Palliative Care Screening  Not Applicable   Skilled Nursing Facility  Not Applicable

## 2024-05-12 ENCOUNTER — Telehealth: Payer: Self-pay

## 2024-05-12 NOTE — Transitions of Care (Post Inpatient/ED Visit) (Signed)
   05/12/2024  Name: AMILLIA BIFFLE MRN: [id number] DOB: March 17, 1951  Today's TOC FU Call Status: Today's TOC FU Call Status:: Unsuccessful Call (1st Attempt) Unsuccessful Call (1st Attempt) Date: 05/11/24  Attempted to reach the patient regarding the most recent Inpatient/ED visit. RN CM left confidential voice mail  with call back number on designated phone number in EPIC.   Follow Up Plan: Additional outreach attempts will be made to reach the patient to complete the Transitions of Care (Post Inpatient/ED visit) call.    Bing Edison MSN, RN RN Case Sales executive Health  [facility] Office Hours M-F [phone] Direct Dial: [phone] Main Phone [phone]  Fax: [phone] [email or web address]

## 2024-05-15 ENCOUNTER — Telehealth: Payer: Self-pay

## 2024-05-15 ENCOUNTER — Encounter (INDEPENDENT_AMBULATORY_CARE_PROVIDER_SITE_OTHER): Payer: Self-pay | Admitting: Gastroenterology

## 2024-05-15 ENCOUNTER — Telehealth: Payer: Self-pay | Admitting: *Deleted

## 2024-05-15 ENCOUNTER — Ambulatory Visit (INDEPENDENT_AMBULATORY_CARE_PROVIDER_SITE_OTHER): Admitting: Gastroenterology

## 2024-05-15 VITALS — BP 135/65 | HR 96 | Temp 98.3°F | Ht 60.0 in | Wt 120.0 lb

## 2024-05-15 DIAGNOSIS — R1319 Other dysphagia: Secondary | ICD-10-CM | POA: Diagnosis not present

## 2024-05-15 DIAGNOSIS — K921 Melena: Secondary | ICD-10-CM | POA: Diagnosis not present

## 2024-05-15 DIAGNOSIS — R748 Abnormal levels of other serum enzymes: Secondary | ICD-10-CM | POA: Diagnosis not present

## 2024-05-15 NOTE — Transitions of Care (Post Inpatient/ED Visit) (Signed)
   05/15/2024  Name: [name] MRN: [id number] DOB: 1951/01/02  Today's TOC FU Call Status: Today's TOC FU Call Status:: Unsuccessful Call (2nd Attempt) Unsuccessful Call (2nd Attempt) Date: 05/15/24  Attempted to reach the patient regarding the most recent Inpatient/ED visit.  Follow Up Plan: Additional outreach attempts will be made to reach the patient to complete the Transitions of Care (Post Inpatient/ED visit) call.   Alan Ee, RN, BSN, CEN [employer]- Transition of Care Team.  [facility] [phone]

## 2024-05-15 NOTE — Progress Notes (Signed)
 [name] [name] , M.D. Gastroenterology & Hepatology [facility] Gastroenterology [address] [place], [place] [address] Primary Care Physician: [name] [name], [place] [address] [place] [address]  Chief Complaint: Dysphagia, surveillance colonoscopy and elevated liver enzymes  History of Present Illness:  [name] a 73 y.o. [name] with history of anxiety, CVA, GERD, hyperlipidemia, hypertension, history of myocardial infarction, COPD on oxygen  3 L/min [name] for evaluation of Dysphagia, surveillance colonoscopy and elevated liver enzymes  Patient was seen in hospital by GI for blood per rectum which was self-limiting.  Patient [name] accompanied with son in clinic.  Patient [name] adamant that she had never seen blood in stool.  Does have intermittent constipation.  Patient reports solid food dysphagia intermittently depending on consistency of diet. The patient denies having any nausea, vomiting, fever, chills, hematochezia, melena, hematemesis, abdominal distention, abdominal pain, diarrhea, jaundice, pruritus or weight loss.  Last [id number]  Diffuse, scattered white plaques were found in the upper third of the esophagus and in the middle third of the esophagus ( unclear ir related to candidiasis or food debris) . Biopsies were taken with a cold forceps for histology.  Two benign- appearing, intrinsic mild ( non- circumferential scarring) stenoses were found in the distal esophagus. The narrowest stenosis measured 1. 2 cm ( inner diameter) x less than one cm ( in length) . The stenoses were traversed. A guidewire was placed and the scope was withdrawn. Dilation was performed with a Savary dilator with no resistance at 14 mm and mild resistance at 16 mm. Mucosal disruption was seen upon reinspection in the upper third of the esophguas and at the level of the strictures.  LA Grade A ( one or more mucosal breaks less than 5 mm, not  extending between tops of 2 mucosal folds) esophagitis with no bleeding was found in the lower third of the esophagus.   Most recent colonoscopy 07/17/2014 - Extremely redundant colon.  Colonic diverticulosis.  Inadequate preparation precluded examination of all the rectal and colonic mucosal surfaces. No evidence of stricture or gross colonic neoplasm. I suspect slow colonic transit in the setting of a markedly redundant colon.   She was recommended to have a repeat colonoscopy 6 months later but she never scheduled this.  Surgical: Cholecystectomy    Past Medical History: Past Medical History:  Diagnosis Date   Anxiety    Blind    right   Colitis 09/07/2011   CVA (cerebral infarction)    GERD (gastroesophageal reflux disease)    Hyperlipidemia    Hypertension    Myocardial infarct (HCC)    1997, 2004   Severe major depression with psychotic features (HCC)    2004   Stroke [facility]) 2002    Past Surgical History: Past Surgical History:  Procedure Laterality Date   ABDOMINAL HYSTERECTOMY     BILATERAL SALPINGOOPHORECTOMY     BIOPSY  05/08/2022   Procedure: BIOPSY;  Surgeon: April Angelia Sieving, MD;  [facility];  Service: Gastroenterology;;   CHOLECYSTECTOMY N/A 10/15/2021   Procedure: LAPAROSCOPIC CHOLECYSTECTOMY WITH INTRAOPERATIVE CHOLANGIOGRAM;  Surgeon: April Anes, MD;  Location: [facility] ORS;  Service: General;  Laterality: N/A;   COLONOSCOPY N/A 07/17/2014   RMR: Extremely redundant colon. Colonic diverticulosis. Inadequate prepartation precluded examination of all the rectal and colonic.  I suspect slow colonic transit in the setting of a markely redundant colon.   CORONARY ANGIOPLASTY WITH STENT PLACEMENT  [id number]  cyst removed from left wrist     ERCP N/A 10/10/2021   Procedure: ENDOSCOPIC RETROGRADE CHOLANGIOPANCREATOGRAPHY (ERCP);  Surgeon: April Claudis PENNER, MD;  Location: [facility];  Service: Gastroenterology;  Laterality: N/A;   ERCP N/A 10/13/2021    Procedure: ENDOSCOPIC RETROGRADE CHOLANGIOPANCREATOGRAPHY (ERCP);  Surgeon: April Claudis PENNER, MD;  Location: [facility] ORS;  Service: Gastroenterology;  Laterality: N/A;   ESOPHAGEAL DILATION  05/08/2022   Procedure: ESOPHAGEAL DILATION;  Surgeon: [name], Toribio, MD;  Location: [facility];  Service: Gastroenterology;;  savory dilation'   ESOPHAGOGASTRODUODENOSCOPY  09/05/2004   Dr Vilma gastritis, candida esophagitis   ESOPHAGOGASTRODUODENOSCOPY  09/07/2011   DOQ:Mpwh, esophageal in the distal esophagus/Mild gastritis   ESOPHAGOGASTRODUODENOSCOPY (EGD) WITH PROPOFOL  N/A 05/08/2022   Procedure: ESOPHAGOGASTRODUODENOSCOPY (EGD) WITH PROPOFOL ;  Surgeon: [name] Toribio, MD;  [facility];  Service: Gastroenterology;  Laterality: N/A;  with possible esophageal dilation   Lumps removed from each breast     SPHINCTEROTOMY N/A 10/10/2021   Procedure: SPHINCTEROTOMY INCOMPLETE, STONE NOT REMOVED;  Surgeon: April Claudis PENNER, MD;  Location: [facility];  Service: Gastroenterology;  Laterality: N/A;    Family History: Family History  Problem Relation Age of Onset   Multiple sclerosis Father    Alzheimer's disease Mother    Hypertension Mother    Diabetes Mother    Colon cancer Neg Hx     Social History: Social History   Tobacco Use  Smoking Status Former   Current packs/day: 0.00   Average packs/day: 0.3 packs/day for 40.0 years (10.0 ttl pk-yrs)   Types: Cigarettes   Start date: 08/02/1977   Quit date: 08/02/2017   Years since quitting: 6.7  Smokeless Tobacco Never   Social History   Substance and Sexual Activity  Alcohol  Use No   Alcohol /week: 0.0 standard drinks of alcohol    Social History   Substance and Sexual Activity  Drug Use Yes   Types: Marijuana    Allergies: Allergies  Allergen Reactions   Aspirin  Swelling and Other (See Comments)    Only in large doses will cause a reaction   Bee Venom Swelling    Severe swelling   Contrast Media  [Iodinated Contrast Media] Swelling   Lisinopril Swelling   Motrin [Ibuprofen] Swelling   Penicillins Anaphylaxis    Tolerated Rocephin  Jan 2025   Dilaudid  [Hydromorphone  Hcl] Itching    Medications: Current Outpatient Medications  Medication Sig Dispense Refill   acetaminophen  (TYLENOL ) 650 MG CR tablet Take 1,300 mg by mouth every 8 (eight) hours as needed for pain.     amLODipine  (NORVASC ) 5 MG tablet Take 1 tablet (5 mg total) by mouth daily. 30 tablet 1   aspirin  EC 81 MG tablet Take 81 mg by mouth daily. Swallow whole.     cefdinir  (OMNICEF ) 300 MG capsule Take 1 capsule (300 mg total) by mouth 2 (two) times daily. 4 capsule 0   citalopram  (CELEXA ) 20 MG tablet Take 20 mg by mouth daily.     clopidogrel  (PLAVIX ) 75 MG tablet Take 1 tablet (75 mg total) by mouth daily.     doxycycline  (VIBRA -TABS) 100 MG tablet Take 1 tablet (100 mg total) by mouth every 12 (twelve) hours. 4 tablet 0   fluticasone  (FLONASE ) 50 MCG/ACT nasal spray Place 1 spray into both nostrils daily as needed for rhinitis or allergies.     folic acid  (FOLVITE ) 1 MG tablet Take 1 tablet (1 mg total) by mouth daily.     Ipratropium-Albuterol  (COMBIVENT ) 20-100 MCG/ACT AERS  respimat Inhale 1 puff into the lungs every 6 (six) hours as needed for wheezing or shortness of breath. 4 g 4   losartan  (COZAAR ) 25 MG tablet Take 12.5 mg by mouth daily.     metoprolol  succinate (TOPROL  XL) 50 MG 24 hr tablet Take 1 tablet (50 mg total) by mouth daily. Take with or immediately following a meal. (Patient taking differently: Take 25 mg by mouth daily. Take with or immediately following a meal.) 90 tablet 1   mirtazapine  (REMERON ) 7.5 MG tablet Take 7.5 mg by mouth at bedtime.     montelukast  (SINGULAIR ) 10 MG tablet Take 10 mg by mouth at bedtime.     pantoprazole  (PROTONIX ) 40 MG tablet Take 40 mg by mouth daily.     STIOLTO RESPIMAT  2.5-2.5 MCG/ACT AERS Inhale 2 sprays into the lungs daily.     vitamin B-12 (CYANOCOBALAMIN ) 500  MCG tablet Take 1 tablet (500 mcg total) by mouth daily.     No current facility-administered medications for this visit.    Review of Systems: GENERAL: negative for malaise, night sweats HEENT: No changes in hearing or vision, no nose bleeds or other nasal problems. NECK: Negative for lumps, goiter, pain and significant neck swelling RESPIRATORY: Negative for cough, wheezing CARDIOVASCULAR: Negative for chest pain, leg swelling, palpitations, orthopnea GI: SEE HPI MUSCULOSKELETAL: Negative for joint pain or swelling, back pain, and muscle pain. SKIN: Negative for lesions, rash HEMATOLOGY Negative for prolonged bleeding, bruising easily, and swollen nodes. ENDOCRINE: Negative for cold or heat intolerance, polyuria, polydipsia and goiter. NEURO: negative for tremor, gait imbalance, syncope and seizures. The remainder of the review of systems [name] noncontributory.   Physical Exam: BP 135/65   Pulse 96   Temp 98.3 F (36.8 C)   Ht 5' (1.524 m)   Wt 120 lb (54.4 kg) Comment: pt reported  BMI 23.44 kg/m  GENERAL: The patient [name] AO x3, in no acute distress. HEENT: Head [name] normocephalic and atraumatic. EOMI are intact. Mouth [name] well hydrated and without lesions. NECK: Supple. No masses LUNGS: Clear to auscultation. No presence of rhonchi/wheezing/rales. Adequate chest expansion HEART: RRR, normal s1 and s2. ABDOMEN: Soft, nontender, no guarding, no peritoneal signs, and nondistended. BS +. No masses.  Imaging/Labs: as above     Latest Ref Rng & Units 05/11/2024    5:13 AM 05/10/2024    4:13 AM 05/09/2024    4:25 AM  CBC  WBC 4.0 - 10.5 K/uL 11.3  13.0  9.1   Hemoglobin 12.0 - 15.0 g/dL 8.9  8.6  8.8   Hematocrit 36.0 - 46.0 % 30.0  29.7  28.9   Platelets 150 - 400 K/uL 367  356  376    Lab Results  Component Value Date   IRON 40 05/06/2024   TIBC 221 (L) 05/06/2024   FERRITIN 210 05/06/2024    I personally reviewed and interpreted the available labs, imaging and  endoscopic files.  Impression and Plan:  EDELL MESENBRINK [name] a 72 y.o. [name] with history of anxiety, CVA, GERD, hyperlipidemia, hypertension, history of myocardial infarction, COPD on oxygen  3 L/min [name] for evaluation of Dysphagia, surveillance colonoscopy and elevated liver enzymes   #Painless hematochezia #Dysphagia  Patient was seen back in January 2025 for painless hematochezia and was recommended outpatient colonoscopy as she [name] overdue.  Last colonoscopy 2015 which was fair prep and suggest repeat was 6 months  Patient today [name] adamant that she has not seen blood in stool and [name] undecided  about further colonoscopy.  Reports sending in  stool test which was negative recently  Previously also had upper endoscopy in 2023 with Dr. Eartha found to have esophageal stricture status post dilation and Candida.  Patient persistent dysphagia could be due to recurrence of the stricture or Candida esophagitis  I have discussed with patient and the son in detail the clinical indication for upper endoscopy with dilation and diagnostic colonoscopy given persistent dysphagia and history of painless hematochezia.  I detailed that stool based testing are done in setting of screening for colon cancer and does not suffice as testing for hematochezia  I discussed alternative, risks, benefits, indication of upper endoscopy colonoscopy .Patient and son remains undecided about upper endoscopy and colonoscopy and will reach out back to us  if they are interested.  Again I clearly discussed with patient and son with upper endoscopy and colonoscopy we may be missing a lesion such as malignancy  #Elevated liver enzymes  Mostly cholestatic pattern elevation liver injury, isolated alk phos elevation  Will obtain AMA, ALP isoenzymes and right upper quadrant sono  All questions were answered.      Metha Kolasa April Latoya Diskin, MD Gastroenterology and Hepatology [facility] Gastroenterology   This  chart has been completed using [facility] Dictation software, and while attempts have been made to ensure accuracy , certain words and phrases may not be transcribed as intended

## 2024-05-15 NOTE — Telephone Encounter (Signed)
 Called pt son Oneil and he is aware of appt US  details. He voiced understanding

## 2024-05-15 NOTE — Patient Instructions (Signed)
 It was very nice to meet you today, as dicussed with will plan for the following :  1) Labs and ultrasound  2) If you decide on upper endoscopy and colonoscopy do lets us  know

## 2024-05-16 ENCOUNTER — Telehealth: Payer: Self-pay

## 2024-05-16 NOTE — Transitions of Care (Post Inpatient/ED Visit) (Signed)
   05/16/2024  Name: [name] MRN: [id number] DOB: [birth year]  Today's TOC FU Call Status: Today's TOC FU Call Status:: Successful TOC FU Call Completed TOC FU Call Complete Date: 05/16/24 [facility] RN spoke with son, mark who states he helps patient with her needs, understands her medications, states she has already had a MD appointment and declined TOC program) Patient's Name and Date of Birth confirmed.  Shona Prow RN, CCM [place]  [facility]Population Health RN Care Manager [phone]

## 2024-05-18 ENCOUNTER — Telehealth: Payer: Self-pay | Admitting: Family Medicine

## 2024-05-18 NOTE — Telephone Encounter (Signed)
 Copied from CRM [id number]. Topic: General - Other >> May 18, 2024 10:46 AM Selinda RAMAN wrote: Reason for CRM: Geannie with [facility] called in to alert the provider she will be sending over a fax to look at and be addressed. Please assist patient further.

## 2024-05-18 NOTE — Telephone Encounter (Signed)
 [employer] forms Noted Copied Sleeved Original placed in provider box Copy placed in front desk folder

## 2024-05-18 NOTE — Telephone Encounter (Signed)
 Noted

## 2024-05-19 ENCOUNTER — Other Ambulatory Visit: Payer: Self-pay | Admitting: Family Medicine

## 2024-05-23 NOTE — Telephone Encounter (Signed)
 Faxed back to shipmans fax number on form. Copy sent to scan

## 2024-05-25 ENCOUNTER — Ambulatory Visit (HOSPITAL_COMMUNITY): Admission: RE | Admit: 2024-05-25 | Source: Ambulatory Visit

## 2024-05-30 NOTE — Telephone Encounter (Unsigned)
 Copied from CRM [id number]. Topic: General - Other >> May 30, 2024  2:24 PM Carlatta H wrote: Reason for CRM: Faxed back to shipmans fax number on form. Copy sent to scan//Information was never received//Please resend to: Fax to [phone]

## 2024-06-01 ENCOUNTER — Other Ambulatory Visit: Payer: Self-pay | Admitting: Family Medicine

## 2024-06-01 ENCOUNTER — Encounter (INDEPENDENT_AMBULATORY_CARE_PROVIDER_SITE_OTHER): Payer: Self-pay | Admitting: *Deleted

## 2024-06-02 NOTE — Telephone Encounter (Signed)
 Faxed back with confirmation

## 2024-06-02 NOTE — Telephone Encounter (Signed)
 Will fax back to number requested

## 2024-06-02 NOTE — Telephone Encounter (Signed)
 Received confirmation when faxed to number on form. WIll refax to number given in message. If it doesn't go through, pt can pick up form

## 2024-06-06 NOTE — Telephone Encounter (Signed)
 Revision [employer] Forms  Noted Copied Sleeved  Original copy placed in provider box Copy placed up front folder

## 2024-06-07 ENCOUNTER — Other Ambulatory Visit: Payer: Self-pay | Admitting: Family Medicine

## 2024-06-07 MED ORDER — PANTOPRAZOLE SODIUM 40 MG PO TBEC: 40.0000 mg | DELAYED_RELEASE_TABLET | Freq: Every day | ORAL | 2 refills | Status: DC

## 2024-06-07 NOTE — Telephone Encounter (Signed)
 Copied from CRM #[id number]. Topic: Clinical - Medication Refill >> Jun 07, 2024 10:00 AM Chiquita SQUIBB wrote: Medication: pantoprazole  pantoprazole  (PROTONIX ) 40 MG tablet   Has the patient contacted their pharmacy? Yes (Agent: If no, request that the patient contact the pharmacy for the refill. If patient does not wish to contact the pharmacy document the reason why and proceed with request.) (Agent: If yes, when and what did the pharmacy advise?)  This is the patient's preferred pharmacy:  [facility] [phone] - [place], [place] - [address] AT [facility] [address] [place] [address] Phone: [phone] Fax: [phone]  Is this the correct pharmacy for this prescription? Yes If no, delete pharmacy and type the correct one.   Has the prescription been filled recently? No  Is the patient out of the medication? Yes  Has the patient been seen for an appointment in the last year OR does the patient have an upcoming appointment? Yes  Can we respond through MyChart? No  Agent: Please be advised that Rx refills may take up to 3 business days. We ask that you follow-up with your pharmacy.

## 2024-06-09 NOTE — Telephone Encounter (Signed)
Called patient lvm to call office  

## 2024-06-09 NOTE — Telephone Encounter (Signed)
 This is not a revision. They sent it back  because pt does not qualify. Please let her know she must have medicaid to qualify for an aide.

## 2024-06-22 ENCOUNTER — Other Ambulatory Visit: Payer: Self-pay | Admitting: Family Medicine

## 2024-06-22 MED ORDER — FLUTICASONE PROPIONATE 50 MCG/ACT NA SUSP: 1.0000 | Freq: Every day | NASAL | 3 refills | Status: AC | PRN

## 2024-06-22 NOTE — Telephone Encounter (Signed)
 Copied from CRM [id number]. Topic: Clinical - Medication Refill >> Jun 22, 2024  9:07 AM Larissa S wrote: Medication: fluticasone  (FLONASE ) 50 MCG/ACT nasal spray  Has the patient contacted their pharmacy? Yes (Agent: If no, request that the patient contact the pharmacy for the refill. If patient does not wish to contact the pharmacy document the reason why and proceed with request.) (Agent: If yes, when and what did the pharmacy advise?)  This is the patient's preferred pharmacy:  [facility] [phone] - [place], [place] - [address] AT [facility] [address] [place] [address] Phone: [phone] Fax: [phone]  Is this the correct pharmacy for this prescription? Yes If no, delete pharmacy and type the correct one.   Has the prescription been filled recently? No  Is the patient out of the medication? Yes  Has the patient been seen for an appointment in the last year OR does the patient have an upcoming appointment? Yes  Can we respond through MyChart? Yes  Agent: Please be advised that Rx refills may take up to 3 business days. We ask that you follow-up with your pharmacy.

## 2024-07-06 ENCOUNTER — Ambulatory Visit

## 2024-07-07 ENCOUNTER — Ambulatory Visit: Admitting: Family Medicine

## 2024-07-07 ENCOUNTER — Encounter: Payer: Self-pay | Admitting: Family Medicine

## 2024-07-07 VITALS — BP 160/101 | HR 88 | Resp 16

## 2024-07-07 DIAGNOSIS — I10 Essential (primary) hypertension: Secondary | ICD-10-CM

## 2024-07-07 DIAGNOSIS — E559 Vitamin D deficiency, unspecified: Secondary | ICD-10-CM

## 2024-07-07 DIAGNOSIS — Z23 Encounter for immunization: Secondary | ICD-10-CM | POA: Diagnosis not present

## 2024-07-07 DIAGNOSIS — E038 Other specified hypothyroidism: Secondary | ICD-10-CM

## 2024-07-07 DIAGNOSIS — R7301 Impaired fasting glucose: Secondary | ICD-10-CM | POA: Diagnosis not present

## 2024-07-07 DIAGNOSIS — K219 Gastro-esophageal reflux disease without esophagitis: Secondary | ICD-10-CM

## 2024-07-07 DIAGNOSIS — E782 Mixed hyperlipidemia: Secondary | ICD-10-CM | POA: Diagnosis not present

## 2024-07-07 NOTE — Assessment & Plan Note (Signed)
 Patient educated on CDC recommendation for the vaccine. Verbal consent was obtained from the patient, vaccine administered by nurse, no sign of adverse reactions noted at this time. Patient education on arm soreness and use of tylenol or ibuprofen for this patient  was discussed. Patient educated on the signs and symptoms of adverse effect and advise to contact the office if they occur.

## 2024-07-07 NOTE — Progress Notes (Signed)
 Established Patient Office Visit  Subjective:  Patient ID: [name], female    DOB: [birth year]  Age: 73 y.o. MRN: [id number]  CC:  Chief Complaint  Patient presents with   Medical Management of Chronic Issues    3 month follow up     HPI [name] is a 73 y.o. female with past medical history of Hypertension, CAD, GERD presents for f/u of  chronic medical conditions. For the details of today's visit, please refer to the assessment and plan.       Past Medical History:  Diagnosis Date   Anxiety    Blind    right   Colitis 09/07/2011   CVA (cerebral infarction)    GERD (gastroesophageal reflux disease)    Hyperlipidemia    Hypertension    Myocardial infarct (HCC)    1997, 2004   Severe major depression with psychotic features (HCC)    2004   Stroke [facility]) 2002    Past Surgical History:  Procedure Laterality Date   ABDOMINAL HYSTERECTOMY     BILATERAL SALPINGOOPHORECTOMY     BIOPSY  05/08/2022   Procedure: BIOPSY;  Surgeon: Eartha Angelia Sieving, MD;  [facility];  Service: Gastroenterology;;   CHOLECYSTECTOMY N/A 10/15/2021   Procedure: LAPAROSCOPIC CHOLECYSTECTOMY WITH INTRAOPERATIVE CHOLANGIOGRAM;  Surgeon: Mavis Anes, MD;  Location: [facility];  Service: General;  Laterality: N/A;   COLONOSCOPY N/A 07/17/2014   RMR: Extremely redundant colon. Colonic diverticulosis. Inadequate prepartation precluded examination of all the rectal and colonic.  I suspect slow colonic transit in the setting of a markely redundant colon.   CORONARY ANGIOPLASTY WITH STENT PLACEMENT  8002,[id number]   cyst removed from left wrist     ERCP N/A 10/10/2021   Procedure: ENDOSCOPIC RETROGRADE CHOLANGIOPANCREATOGRAPHY (ERCP);  Surgeon: Golda Claudis PENNER, MD;  Location: [facility];  Service: Gastroenterology;  Laterality: N/A;   ERCP N/A 10/13/2021   Procedure: ENDOSCOPIC RETROGRADE CHOLANGIOPANCREATOGRAPHY (ERCP);  Surgeon: Golda Claudis PENNER, MD;  Location: [facility];  Service:  Gastroenterology;  Laterality: N/A;   ESOPHAGEAL DILATION  05/08/2022   Procedure: ESOPHAGEAL DILATION;  Surgeon: Eartha Angelia, Sieving, MD;  [facility];  Service: Gastroenterology;;  savory dilation'   ESOPHAGOGASTRODUODENOSCOPY  09/05/2004   Dr Vilma gastritis, candida esophagitis   ESOPHAGOGASTRODUODENOSCOPY  09/07/2011   DOQ:Mpwh, esophageal in the distal esophagus/Mild gastritis   ESOPHAGOGASTRODUODENOSCOPY (EGD) WITH PROPOFOL  N/A 05/08/2022   Procedure: ESOPHAGOGASTRODUODENOSCOPY (EGD) WITH PROPOFOL ;  Surgeon: Eartha Angelia Sieving, MD;  [facility];  Service: Gastroenterology;  Laterality: N/A;  with possible esophageal dilation   Lumps removed from each breast     SPHINCTEROTOMY N/A 10/10/2021   Procedure: SPHINCTEROTOMY INCOMPLETE, STONE NOT REMOVED;  Surgeon: Golda Claudis PENNER, MD;  Location: [facility];  Service: Gastroenterology;  Laterality: N/A;    Family History  Problem Relation Age of Onset   Multiple sclerosis Father    Alzheimer's disease Mother    Hypertension Mother    Diabetes Mother    Colon cancer Neg Hx     Social History   Socioeconomic History   Marital status: Widowed    Spouse name: Not on file   Number of children: 3   Years of education: Not on file   Highest education level: Not on file  Occupational History   Occupation: disabled, Best boy  Tobacco Use   Smoking status: Former    Current packs/day: 0.00    Average packs/day: 0.3 packs/day for 40.0 years (10.0 ttl pk-yrs)  Types: Cigarettes    Start date: 08/02/1977    Quit date: 08/02/2017    Years since quitting: 6.9   Smokeless tobacco: Never  Vaping Use   Vaping status: Never Used  Substance and Sexual Activity   Alcohol  use: No    Alcohol /week: 0.0 standard drinks of alcohol    Drug use: Yes    Types: Marijuana   Sexual activity: Not Currently    Birth control/protection: Post-menopausal  Other Topics Concern   Not on file  Social History  Narrative   Lives alone   Social Drivers of Health   Financial Resource Strain: Not on file  Food Insecurity: No Food Insecurity (05/06/2024)   Hunger Vital Sign    Worried About Running Out of Food in the Last Year: Never true    Ran Out of Food in the Last Year: Never true  Transportation Needs: No Transportation Needs (05/06/2024)   PRAPARE - Administrator, Civil Service (Medical): No    Lack of Transportation (Non-Medical): No  Physical Activity: Not on file  Stress: Not on file  Social Connections: Moderately Isolated (05/06/2024)   Social Connection and Isolation Panel    Frequency of Communication with Friends and Family: More than three times a week    Frequency of Social Gatherings with Friends and Family: More than three times a week    Attends Religious Services: More than 4 times per year    Active Member of [employer] or Organizations: No    Attends Banker Meetings: Never    Marital Status: Widowed  Intimate Partner Violence: Not At Risk (05/06/2024)   Humiliation, Afraid, Rape, and Kick questionnaire    Fear of Current or Ex-Partner: No    Emotionally Abused: No    Physically Abused: No    Sexually Abused: No    Outpatient Medications Prior to Visit  Medication Sig Dispense Refill   acetaminophen  (TYLENOL ) 650 MG CR tablet Take 1,300 mg by mouth every 8 (eight) hours as needed for pain.     amLODipine  (NORVASC ) 5 MG tablet Take 1 tablet (5 mg total) by mouth daily. 30 tablet 1   aspirin  EC 81 MG tablet Take 81 mg by mouth daily. Swallow whole.     citalopram  (CELEXA ) 20 MG tablet Take 20 mg by mouth daily.     clopidogrel  (PLAVIX ) 75 MG tablet Take 1 tablet (75 mg total) by mouth daily.     fluticasone  (FLONASE ) 50 MCG/ACT nasal spray Place 1 spray into both nostrils daily as needed for rhinitis or allergies. 16 g 3   folic acid  (FOLVITE ) 1 MG tablet Take 1 tablet (1 mg total) by mouth daily.     Ipratropium-Albuterol  (COMBIVENT ) 20-100 MCG/ACT  AERS respimat Inhale 1 puff into the lungs every 6 (six) hours as needed for wheezing or shortness of breath. 4 g 4   losartan  (COZAAR ) 25 MG tablet Take 12.5 mg by mouth daily.     metoprolol  succinate (TOPROL  XL) 50 MG 24 hr tablet Take 1 tablet (50 mg total) by mouth daily. Take with or immediately following a meal. (Patient taking differently: Take 25 mg by mouth daily. Take with or immediately following a meal.) 90 tablet 1   mirtazapine  (REMERON ) 7.5 MG tablet Take 7.5 mg by mouth at bedtime.     montelukast  (SINGULAIR ) 10 MG tablet Take 10 mg by mouth at bedtime.     pantoprazole  (PROTONIX ) 40 MG tablet Take 1 tablet (40 mg total) by mouth daily.  30 tablet 2   STIOLTO RESPIMAT  2.5-2.5 MCG/ACT AERS Inhale 2 sprays into the lungs daily.     vitamin B-12 (CYANOCOBALAMIN ) 500 MCG tablet Take 1 tablet (500 mcg total) by mouth daily.     cefdinir  (OMNICEF ) 300 MG capsule Take 1 capsule (300 mg total) by mouth 2 (two) times daily. 4 capsule 0   doxycycline  (VIBRA -TABS) 100 MG tablet Take 1 tablet (100 mg total) by mouth every 12 (twelve) hours. 4 tablet 0   No facility-administered medications prior to visit.    Allergies  Allergen Reactions   Aspirin  Swelling and Other (See Comments)    Only in large doses will cause a reaction   Bee Venom Swelling    Severe swelling   Contrast Media [Iodinated Contrast Media] Swelling   Lisinopril Swelling   Motrin [Ibuprofen] Swelling   Penicillins Anaphylaxis    Tolerated Rocephin  Jan 2025   Dilaudid  [Hydromorphone  Hcl] Itching    ROS Review of Systems  Constitutional:  Negative for chills and fever.  Eyes:  Negative for visual disturbance.  Respiratory:  Negative for chest tightness and shortness of breath.   Neurological:  Negative for dizziness and headaches.      Objective:    Physical Exam HENT:     Head: Normocephalic.     Mouth/Throat:     Mouth: Mucous membranes are moist.  Cardiovascular:     Rate and Rhythm: Normal rate.      Heart sounds: Normal heart sounds.  Pulmonary:     Effort: Pulmonary effort is normal.     Breath sounds: Normal breath sounds.  Neurological:     Mental Status: She is alert.     BP (!) 160/101   Pulse 88   Resp 16   SpO2 98%  Wt Readings from Last 3 Encounters:  05/15/24 120 lb (54.4 kg)  05/06/24 120 lb (54.4 kg)  03/30/24 121 lb (54.9 kg)    Lab Results  Component Value Date   TSH 0.502 01/08/2024   Lab Results  Component Value Date   WBC 11.3 (H) 05/11/2024   HGB 8.9 (L) 05/11/2024   HCT 30.0 (L) 05/11/2024   MCV 110.7 (H) 05/11/2024   PLT 367 05/11/2024   Lab Results  Component Value Date   NA 144 05/11/2024   K 4.3 05/11/2024   CO2 30 05/11/2024   GLUCOSE 117 (H) 05/11/2024   BUN 11 05/11/2024   CREATININE 0.84 05/11/2024   BILITOT 0.3 03/30/2024   ALKPHOS 297 (H) 03/30/2024   AST 18 03/30/2024   ALT 8 03/30/2024   PROT 8.5 03/30/2024   ALBUMIN 4.0 03/30/2024   CALCIUM  9.1 05/11/2024   ANIONGAP 9 05/11/2024   EGFR 56 (L) 03/30/2024   Lab Results  Component Value Date   CHOL 267 (H) 03/30/2024   Lab Results  Component Value Date   HDL 69 03/30/2024   Lab Results  Component Value Date   LDLCALC 177 (H) 03/30/2024   Lab Results  Component Value Date   TRIG 118 03/30/2024   Lab Results  Component Value Date   CHOLHDL 3.9 03/30/2024   Lab Results  Component Value Date   HGBA1C 5.8 (H) 03/30/2024      Assessment & Plan:  Essential hypertension Assessment & Plan: Uncontrolled BP The patient reports not taking her BP medication today. She is asymptomatic. She is encouraged to monitor her BP at home and report to the ED if her BP is above 180/120. A low-sodium diet and  increased physical activity are encouraged.    Gastroesophageal reflux disease without esophagitis Assessment & Plan: Stable GERD diet encouraged   Encounter for immunization Assessment & Plan: Patient educated on CDC recommendation for the vaccine. Verbal  consent was obtained from the patient, vaccine administered by nurse, no sign of adverse reactions noted at this time. Patient education on arm soreness and use of tylenol  or ibuprofen for this patient  was discussed. Patient educated on the signs and symptoms of adverse effect and advise to contact the office if they occur.   Orders: -     Flu vaccine HIGH DOSE PF(Fluzone Trivalent)  IFG (impaired fasting glucose) -     Hemoglobin A1c  Vitamin D  deficiency -     VITAMIN D  25 Hydroxy (Vit-D Deficiency, Fractures)  TSH (thyroid -stimulating hormone deficiency) -     TSH + free T4  Mixed hyperlipidemia -     Lipid panel  Note: This chart has been completed using Engineer, civil (consulting) software, and while attempts have been made to ensure accuracy, certain words and phrases may not be transcribed as intended.    Follow-up: No follow-ups on file.   Harveen Flesch  Z Bacchus, FNP

## 2024-07-07 NOTE — Patient Instructions (Signed)
 I appreciate the opportunity to provide care to you today!    Follow up:  5 months  Labs: please stop by the lab today to get your blood drawn (TSH, Lipid profile, HgA1c, Vit D)  For a Healthier YOU, I Recommend: Reducing your intake of sugar, sodium, carbohydrates, and saturated fats. Increasing your fiber intake by incorporating more whole grains, fruits, and vegetables into your meals. Setting healthy goals with a focus on lowering your consumption of carbs, sugar, and unhealthy fats. Adding variety to your diet by including a wide range of fruits and vegetables. Cutting back on soda and limiting processed foods as much as possible. Staying active: In addition to taking your weight loss medication, aim for at least 150 minutes of moderate-intensity physical activity each week for optimal results.    Please follow up if your symptoms worsen or fail to improve.    Please continue to a heart-healthy diet and increase your physical activities. Try to exercise for at least five days a week.    It was a pleasure to see you and I look forward to continuing to work together on your health and well-being. Please do not hesitate to call the office if you need care or have questions about your care.  In case of emergency, please visit the Emergency Department for urgent care, or contact our clinic at [phone] to schedule an appointment. We're here to help you!   Have a wonderful day and week. With Gratitude, Meade JENEANE Gerlach MSN, FNP-BC, PMHNP-BC

## 2024-07-07 NOTE — Assessment & Plan Note (Signed)
 Uncontrolled BP The patient reports not taking her BP medication today. She is asymptomatic. She is encouraged to monitor her BP at home and report to the ED if her BP is above 180/120. A low-sodium diet and increased physical activity are encouraged.

## 2024-07-07 NOTE — Assessment & Plan Note (Signed)
 Stable GERD diet encouraged

## 2024-07-08 LAB — LIPID PANEL
Chol/HDL Ratio: 3.9 ratio (ref 0.0–4.4)
Cholesterol, Total: 308 mg/dL — ABNORMAL HIGH (ref 100–199)
HDL: 78 mg/dL (ref >39)
LDL Chol Calc (NIH): 211 mg/dL — ABNORMAL HIGH (ref 0–99)
Triglycerides: 111 mg/dL (ref 0–149)
VLDL Cholesterol Cal: 19 mg/dL (ref 5–40)

## 2024-07-08 LAB — HEMOGLOBIN A1C
Est. average glucose Bld gHb Est-mCnc: 120 mg/dL
Hgb A1c MFr Bld: 5.8 % — ABNORMAL HIGH (ref 4.8–5.6)

## 2024-07-08 LAB — VITAMIN D 25 HYDROXY (VIT D DEFICIENCY, FRACTURES): Vit D, 25-Hydroxy: 30.5 ng/mL (ref 30.0–100.0)

## 2024-07-08 LAB — TSH+FREE T4
Free T4: 1.55 ng/dL (ref 0.82–1.77)
TSH: 0.909 u[IU]/mL (ref 0.450–4.500)

## 2024-07-10 ENCOUNTER — Ambulatory Visit: Payer: Self-pay | Admitting: Family Medicine

## 2024-07-10 DIAGNOSIS — E782 Mixed hyperlipidemia: Secondary | ICD-10-CM

## 2024-07-10 MED ORDER — ROSUVASTATIN CALCIUM 10 MG PO TABS: 10.0000 mg | ORAL_TABLET | Freq: Every day | ORAL | 3 refills | Status: AC

## 2024-07-19 ENCOUNTER — Encounter: Payer: Self-pay | Admitting: Internal Medicine

## 2024-07-19 ENCOUNTER — Ambulatory Visit: Attending: Internal Medicine | Admitting: Internal Medicine

## 2024-07-19 VITALS — BP 158/82 | HR 56 | Ht 60.0 in | Wt 115.8 lb

## 2024-07-19 DIAGNOSIS — R079 Chest pain, unspecified: Secondary | ICD-10-CM | POA: Diagnosis not present

## 2024-07-19 DIAGNOSIS — I2510 Atherosclerotic heart disease of native coronary artery without angina pectoris: Secondary | ICD-10-CM | POA: Diagnosis not present

## 2024-07-19 DIAGNOSIS — R0602 Shortness of breath: Secondary | ICD-10-CM

## 2024-07-19 NOTE — Progress Notes (Signed)
 Cardiology Office Note  Date: 07/19/2024   ID: Linley, Moskal [birth year], MRN [id number]  PCP:  [name], FNP  Cardiologist:  Diannah SHAUNNA Maywood, MD Electrophysiologist:  None    History of Present Illness: [name] is a 73 y.o. female known to have new onset cardiomyopathy LVEF 35 to 40% with RWMA in 9/23, CAD s/p RCA PCI, CTO RCA remote history, history of CVA, HTN, HLD, COPD is here for follow-up visit.  Patient was admitted to [facility] in 9/23 and transferred to [facility] for severe COPD exacerbation requiring intubation and mechanical ventilation. Echocardiogram showed LVEF 35 to 40% with RWMA consistent with a stress-induced cardiomyopathy versus LAD infarction.repeat echocardiogram in 03/2023 showed improved LVEF to 50%.  She was admitted at [facility] with multifocal pneumonia in August 2025 and has been having shortness of breath since then.  She also reports having chest pains with exertion and also at rest.  Does not have other symptoms of dizziness, syncope, palpitations, leg swelling.  Not much active at home.  Past Medical History:  Diagnosis Date   Anxiety    Blind    right   Colitis 09/07/2011   CVA (cerebral infarction)    GERD (gastroesophageal reflux disease)    Hyperlipidemia    Hypertension    Myocardial infarct (HCC)    1997, 2004   Severe major depression with psychotic features (HCC)    2004   Stroke [facility]) 2002    Past Surgical History:  Procedure Laterality Date   ABDOMINAL HYSTERECTOMY     BILATERAL SALPINGOOPHORECTOMY     BIOPSY  05/08/2022   Procedure: BIOPSY;  Surgeon: Eartha Angelia Sieving, MD;  [facility];  Service: Gastroenterology;;   CHOLECYSTECTOMY N/A 10/15/2021   Procedure: LAPAROSCOPIC CHOLECYSTECTOMY WITH INTRAOPERATIVE CHOLANGIOGRAM;  Surgeon: Mavis Anes, MD;  Location: [facility];  Service: General;  Laterality: N/A;   COLONOSCOPY N/A 07/17/2014   RMR: Extremely redundant colon. Colonic  diverticulosis. Inadequate prepartation precluded examination of all the rectal and colonic.  I suspect slow colonic transit in the setting of a markely redundant colon.   CORONARY ANGIOPLASTY WITH STENT PLACEMENT  [id number]   cyst removed from left wrist     ERCP N/A 10/10/2021   Procedure: ENDOSCOPIC RETROGRADE CHOLANGIOPANCREATOGRAPHY (ERCP);  Surgeon: Golda Claudis PENNER, MD;  Location: [facility];  Service: Gastroenterology;  Laterality: N/A;   ERCP N/A 10/13/2021   Procedure: ENDOSCOPIC RETROGRADE CHOLANGIOPANCREATOGRAPHY (ERCP);  Surgeon: Golda Claudis PENNER, MD;  Location: [facility];  Service: Gastroenterology;  Laterality: N/A;   ESOPHAGEAL DILATION  05/08/2022   Procedure: ESOPHAGEAL DILATION;  Surgeon: Eartha Angelia, Sieving, MD;  [facility];  Service: Gastroenterology;;  savory dilation'   ESOPHAGOGASTRODUODENOSCOPY  09/05/2004   Dr Vilma gastritis, candida esophagitis   ESOPHAGOGASTRODUODENOSCOPY  09/07/2011   DOQ:Mpwh, esophageal in the distal esophagus/Mild gastritis   ESOPHAGOGASTRODUODENOSCOPY (EGD) WITH PROPOFOL  N/A 05/08/2022   Procedure: ESOPHAGOGASTRODUODENOSCOPY (EGD) WITH PROPOFOL ;  Surgeon: Eartha Angelia Sieving, MD;  [facility];  Service: Gastroenterology;  Laterality: N/A;  with possible esophageal dilation   Lumps removed from each breast     SPHINCTEROTOMY N/A 10/10/2021   Procedure: SPHINCTEROTOMY INCOMPLETE, STONE NOT REMOVED;  Surgeon: Golda Claudis PENNER, MD;  Location: [facility];  Service: Gastroenterology;  Laterality: N/A;    Current Outpatient Medications  Medication Sig Dispense Refill   acetaminophen  (TYLENOL ) 650 MG CR tablet Take 1,300 mg by mouth every 8 (eight) hours as needed for pain.  amLODipine  (NORVASC ) 5 MG tablet Take 1 tablet (5 mg total) by mouth daily. 30 tablet 1   aspirin  EC 81 MG tablet Take 81 mg by mouth daily. Swallow whole.     cefdinir  (OMNICEF ) 300 MG capsule Take 1 capsule (300 mg total) by mouth 2 (two) times  daily. 4 capsule 0   citalopram  (CELEXA ) 20 MG tablet Take 20 mg by mouth daily.     clopidogrel  (PLAVIX ) 75 MG tablet Take 1 tablet (75 mg total) by mouth daily.     doxycycline  (VIBRA -TABS) 100 MG tablet Take 1 tablet (100 mg total) by mouth every 12 (twelve) hours. 4 tablet 0   fluticasone  (FLONASE ) 50 MCG/ACT nasal spray Place 1 spray into both nostrils daily as needed for rhinitis or allergies. 16 g 3   folic acid  (FOLVITE ) 1 MG tablet Take 1 tablet (1 mg total) by mouth daily.     Ipratropium-Albuterol  (COMBIVENT ) 20-100 MCG/ACT AERS respimat Inhale 1 puff into the lungs every 6 (six) hours as needed for wheezing or shortness of breath. 4 g 4   losartan  (COZAAR ) 25 MG tablet Take 12.5 mg by mouth daily.     metoprolol  succinate (TOPROL  XL) 50 MG 24 hr tablet Take 1 tablet (50 mg total) by mouth daily. Take with or immediately following a meal. (Patient taking differently: Take 25 mg by mouth daily. Take with or immediately following a meal.) 90 tablet 1   mirtazapine  (REMERON ) 7.5 MG tablet Take 7.5 mg by mouth at bedtime.     montelukast  (SINGULAIR ) 10 MG tablet Take 10 mg by mouth at bedtime.     pantoprazole  (PROTONIX ) 40 MG tablet Take 1 tablet (40 mg total) by mouth daily. 30 tablet 2   rosuvastatin  (CRESTOR ) 10 MG tablet Take 1 tablet (10 mg total) by mouth daily. 90 tablet 3   STIOLTO RESPIMAT  2.5-2.5 MCG/ACT AERS Inhale 2 sprays into the lungs daily.     vitamin B-12 (CYANOCOBALAMIN ) 500 MCG tablet Take 1 tablet (500 mcg total) by mouth daily.     No current facility-administered medications for this visit.   Allergies:  Aspirin , Bee venom, Contrast media [iodinated contrast media], Lisinopril, Motrin [ibuprofen], Penicillins, and Dilaudid  [hydromorphone  hcl]   Social History: The patient  reports that she quit smoking about 6 years ago. Her smoking use included cigarettes. She started smoking about 46 years ago. She has a 10 pack-year smoking history. She has never used smokeless  tobacco. She reports current drug use. Drug: Marijuana. She reports that she does not drink alcohol .   Family History: The patient's family history includes Alzheimer's disease in her mother; Diabetes in her mother; Hypertension in her mother; Multiple sclerosis in her father.   ROS:  Please see the history of present illness. Otherwise, complete review of systems is positive for none  All other systems are reviewed and negative.   Physical Exam: VS:  BP (!) 158/82 (BP Location: Right Arm)   Pulse (!) 56   Ht 5' (1.524 m)   Wt 115 lb 12.8 oz (52.5 kg)   SpO2 100%   BMI 22.62 kg/m , BMI Body mass index is 22.62 kg/m.  Wt Readings from Last 3 Encounters:  07/19/24 115 lb 12.8 oz (52.5 kg)  05/15/24 120 lb (54.4 kg)  05/06/24 120 lb (54.4 kg)    General: Patient appears comfortable at rest. HEENT: Conjunctiva and lids normal, oropharynx clear with moist mucosa. Neck: Supple, no elevated JVP or carotid bruits, no thyromegaly. Lungs: Clear to  auscultation, nonlabored breathing at rest. Cardiac: Regular rate and rhythm, no S3 or significant systolic murmur, no pericardial rub. Abdomen: Soft, nontender, no hepatomegaly, bowel sounds present, no guarding or rebound. Extremities: No pitting edema, distal pulses 2+. Skin: Warm and dry. Musculoskeletal: No kyphosis. Neuropsychiatric: Alert and oriented x3, affect grossly appropriate.  Recent Labwork: 11/23/2023: B Natriuretic Peptide 101.0 03/30/2024: ALT 8; AST 18 05/11/2024: BUN 11; Creatinine, Ser 0.84; Hemoglobin 8.9; Magnesium  1.6; Platelets 367; Potassium 4.3; Sodium 144 07/07/2024: TSH 0.909     Component Value Date/Time   CHOL 308 (H) 07/07/2024 1125   TRIG 111 07/07/2024 1125   HDL 78 07/07/2024 1125   CHOLHDL 3.9 07/07/2024 1125   CHOLHDL 2.2 05/30/2022 0525   VLDL 5 05/30/2022 0525   LDLCALC 211 (H) 07/07/2024 1125     Assessment and Plan:  Stress-induced cardiomyopathy / Hfimpef (35 to 40% in 2023 improved to 50% in  2024) CAD S/P RCA PCI in 1997 with residual CTO RCA on the repeat cath IN ?2004, has chest pains HLD, not at goal HTN, controlled COPD on home O2   - Patient reports having shortness of breath since she was discharged from the hospital in August 2025.  She was admitted for the management of multifocal pneumonia.  She also reports having exertional and rest chest pain.  Not much active at home.  She has known CAD.  Continue Plavix  monotherapy.  No ischemia evaluation was performed recently.  Will obtain Lexiscan.  I reviewed her lipid panel, LDL 211 in October 2025.  TG 101, within normal limits.  She reports compliance with her statin therapy however she is not compliant with diet.  She reports that she cut back on the junk food.  Hopefully her numbers improved.  Will repeat lipid panel in 2 months.  If LDL is still more than 70, she is agreeable to start injectables.  She does not like to inject medications.  Will start Leqvio at that time.  Currently on home oxygen  for COPD.   I have spent a total of 30 minutes reviewing her chart, EKG, labs, notes, echo, face-to-face discussion/counseling of her medical condition, pathophysiology, evaluation, management, and documenting the findings in the note.  I answered all her questions.   Disposition:  Follow up 6 months  Signed Maresa Morash Priya Bronislaw Switzer, MD, 07/19/2024 1:57 PM    [facility] [address] [place], [place], [place] [address]

## 2024-07-19 NOTE — Patient Instructions (Signed)
 Medication Instructions:   Your physician recommends that you continue on your current medications as directed. Please refer to the Current Medication list given to you today.   Labwork: Fasting Lipids in 2 months- end of December   Testing/Procedures: Your physician has requested that you have a lexiscan myoview. For further information please visit [email or web address] Please follow instruction sheet, as given.   Follow-Up: 6 months  Any Other Special Instructions Will Be Listed Below (If Applicable).  If you need a refill on your cardiac medications before your next appointment, please call your pharmacy.

## 2024-07-24 ENCOUNTER — Ambulatory Visit: Admitting: Internal Medicine

## 2024-07-24 ENCOUNTER — Encounter (HOSPITAL_COMMUNITY)

## 2024-07-24 ENCOUNTER — Encounter: Payer: Self-pay | Admitting: Internal Medicine

## 2024-07-24 VITALS — BP 142/63 | HR 109 | Ht 60.0 in | Wt 115.8 lb

## 2024-07-24 DIAGNOSIS — D360 Benign neoplasm of lymph nodes: Secondary | ICD-10-CM

## 2024-07-24 DIAGNOSIS — J9611 Chronic respiratory failure with hypoxia: Secondary | ICD-10-CM | POA: Diagnosis not present

## 2024-07-24 DIAGNOSIS — J449 Chronic obstructive pulmonary disease, unspecified: Secondary | ICD-10-CM

## 2024-07-24 MED ORDER — TRELEGY ELLIPTA 200-62.5-25 MCG/ACT IN AEPB: 1.0000 | INHALATION_SPRAY | Freq: Every morning | RESPIRATORY_TRACT | Status: AC

## 2024-07-24 MED ORDER — ALBUTEROL SULFATE (2.5 MG/3ML) 0.083% IN NEBU: 2.5000 mg | INHALATION_SOLUTION | RESPIRATORY_TRACT | 12 refills | Status: AC | PRN

## 2024-07-24 MED ORDER — UMECLIDINIUM-VILANTEROL 62.5-25 MCG/ACT IN AEPB: 1.0000 | INHALATION_SPRAY | Freq: Every day | RESPIRATORY_TRACT | 11 refills | Status: AC

## 2024-07-24 NOTE — Progress Notes (Signed)
 [name], female    DOB: [birth year]    MRN: [id number]   Brief patient profile:  56 yobf quit smoking 07/2017 and w/in year got started on inhalers  referred to pulmonary clinic in [name]  10/01/2022 by [name]  for copd/ 02 dep   History of Present Illness  10/01/2022  Pulmonary/ 1st [name] eval/ [name] / [name] [name] out of rehab since Jul 04 2022 on combivent  prn and 02 prn as well  Chief Complaint  Patient presents with   Consult    Pt consult was admitted 9/8-9/27 for hypoxia, she is currently using 2L O2 PRN - she is having increased SOB.   Dyspnea:  cleaning house s 02  Cough: rattling but min mucoid production  Sleep: flat bed 2 pillows  SABA use: combivent  but not using smi correctly  02: 2lpm hs/ sitting 2lpm  most time does not wear it walking  Rec Plan A = Automatic = Always=    Stiolto 2 pffs each am  Work on inhaler technique:  Plan B = Backup (to supplement plan A, not to replace it) Only use your albuterol -ipatropium inhaler as a rescue medication 0xygen 3lpm but ok to increase if needed to keep over 90% walking but remember to turn back down if sats well above 90% at rest.   Referred to cards for ex  cp/ sys chf by last echo > seen 07/13/23 and denied cp, doe and echo EF up to 50% so f/u yearly, no change in Rx     09/01/2023  f/u ov/[name]/[name] re:  COPD/emphysema  Gold stage?  02 dep  maint on stiolto  pfts and ldsct not done as rec  Chief Complaint  Patient presents with   COPD   Shortness of Breath  Dyspnea:  room to room at home off 02 then sits down and uses 02 to recover  Cough: minimal > clear in am  Sleeping: bed is level 2 pillows no  resp cc  SABA use: used twice since last visit, does not know upper  limit for prn use  02: 3lpm hs 24/7 x when walking  Lung cancer screening: not done Rec Pantoprazole  (protonix ) 40 mg   Take  30-60 min before first meal of the day and Pepcid  (famotidine )  20 mg after supper until return to  [name]  If you continue to have a problem swallowing on the above and off all mint/ menthol and chocolate you will need to check with Dr [name] about a referral to GI doctor Only use your albuterol  as a rescue medication  Work on inhaler technique:   Make sure you check your oxygen  saturation  AT  your highest level of activity (not after you stop)   to be sure it stays over 90% My [name] will be contacting you by phone for referral for pfts  > not done as of 01/25/2024  - if you don't hear back from my [name] within one week please call us  back or notify us  thru MyChart and we'll address it right away.   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 This is stable from most recent prior and not seen in 2023.  No hemothorax, pneumothorax, or pulmonary contusion. Severe emphysema with asymmetric air trapping in the right upper lobe. 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 Bands of scarring in the right upper and middle lobes. No new or growing nodule, there will be chest CT follow-up for anterior mediastinal finding.    01/25/2024  f/u ov/[name] [name]/[name] re: copd / emphysema  maint on stiolto  did  bring inhalers  Chief Complaint  Patient presents with   COPD   Shortness of Breath  Dyspnea:  walks with walker improving since fell  Cough: none  Sleeping: flat bed / 2 pillows  s  resp cc  SABA use: rarely /  02: 3lpm 24/7   Rec Anoro (or Trelegy sample)  one click each am and take any drags off it as you need to get it all in then rinse and gargle  -   If mouth or throat bother you at all,  try brushing teeth/gums/tongue with arm and hammer toothpaste    Admit date:     05/06/2024  Discharge date: 05/11/24  Discharge Physician: Alm  Tat    PCP: April Wilhelmena [name], FNP    Recommendations at discharge:   Please follow up with primary care provider within 1-2 weeks  Please repeat BMP and CBC in one week       Hospital Course: 73 year old female with 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  She is having pain in the right upper chest area radiating into the back worse with coughing.  She is also been having fever and chills at home.  She also reports having nonproductive nocturnal coughing.  She has been having some increasing shortness of breath with exertion.  She has increasing oxygen  requirement with exertion noted in the ED to become hypoxic with ambulating and tachycardic.  Her oxygen  was increased to 4 L/min from baseline of 3 L/min with exertion.  Her chest x-ray demonstrated concern for right upper lobe infiltrates.  Her cardiac workup has been reassuring with 2 normal high-sensitivity troponin tests.  Patient being admitted for further management.     Assessment and Plan:   Acute on chronic respiratory failure with hypoxia -secondary to right upper lobe pneumonia/multifocal pneumonia -Continue supportive measures and pneumonia treatment as ordered -Increased supplemental oxygen  to 4 L/min with exertion -weaned back to 3L at time of dc(which is her baseline)  -IV Solu-Medrol  x 1 dose ordered on 8/16, repeat dose on 8/18  -started pulmicort , brovana , duonebs during hospitalization --viral resp panel--neg   Lobar pneumonia -personally reviewed CXR--RML, RLL opacity -continue ceftriaxone  and doxycycline  -Mucinex  600 mg twice daily  --continue BDs - d/c home with cefdinir  and doxy x 2 more days   Chest  pain, atypical -ACS ruled out  -- troponin 21>>19 -GI cocktail given -Treating pneumonia as noted above -pantoprazole  -Scheduled acetaminophen  given pleuritic nature of pain   Impaired glucose tolerance --Started ISS --03/30/24 A1C--5.8 --05/07/22 A1C--5.4   Macrocytic anemia/B12 and folic acid  deficiency -Resume home daily folic acid  and B12   GERD -continue pantoprazole    Chronic HFimpEF --01/12/24 Echo EF 60-65%, normal RVF ---05/29/22 echo--EF 35-40%, apical HK, trivial MR/TR -05/31/22--repeat limited Echo-EF 35-40%, unchanged -clinically compensated   Hypertension  -- continue metoprolol  succinate, added amlodipine  5 mg  - restart home losartain  07/24/2024  post hosp   f/u ov/[place] [name]/[facility] re: copd/ emphysema maint on combivent  prn - out of stiolto and technique very poor anyway   Chief Complaint  Patient presents with   COPD    Doe more than usual - medication not helping    Dyspnea:  goal is to get from one room to the other  Cough: dry sounding  Sleeping:  flat bed and pillows and no  resp cc  SABA use: combivent   once or twice aily  02: 3lpm cont 24&/7      No obvious day to day or daytime variability or assoc excess/ purulent sputum or mucus plugs or hemoptysis or cp or chest tightness, subjective wheeze or overt sinus or hb symptoms.    Also denies any obvious fluctuation of symptoms with weather or environmental changes or other aggravating or alleviating factors except as outlined above   No unusual exposure hx or h/o childhood pna/ asthma or knowledge of premature birth.  Current Allergies, Complete Past Medical History, Past Surgical History, Family History, and Social History were reviewed in [employer] record.  ROS  The following are not active complaints unless bolded Hoarseness, sore throat, dysphagia, dental problems, itching, sneezing,  nasal congestion or discharge of excess mucus or purulent secretions, ear  ache,   fever, chills, sweats, unintended wt loss or wt gain, classically pleuritic or exertional cp,  orthopnea pnd or arm/hand swelling  or leg swelling, presyncope, palpitations, abdominal pain, anorexia, nausea, vomiting, diarrhea  or change in bowel habits or change in bladder habits, change in stools or change in urine, dysuria, hematuria,  rash, arthralgias, visual complaints, headache, numbness, weakness or ataxia or problems with walking or coordination,  change in mood or  memory.        Current Meds  Medication Sig   acetaminophen  (TYLENOL ) 650 MG CR tablet Take 1,300 mg by mouth every 8 (eight) hours as needed for pain.   albuterol  (PROVENTIL ) (2.5 MG/3ML) 0.083% nebulizer solution Take 3 mLs (2.5 mg total) by nebulization every 4 (four) hours as needed.   amLODipine  (NORVASC ) 5 MG tablet Take 1 tablet (5 mg total) by mouth daily.   aspirin  EC 81 MG tablet Take 81 mg by mouth daily. Swallow whole.   citalopram  (CELEXA ) 20 MG tablet Take 20 mg by mouth daily.   clopidogrel  (PLAVIX ) 75 MG tablet Take 1 tablet (75 mg total) by mouth daily.   fluticasone  (FLONASE ) 50 MCG/ACT nasal spray Place 1 spray into both nostrils daily as needed for rhinitis or allergies.   Fluticasone -Umeclidin-Vilant (TRELEGY ELLIPTA ) 200-62.5-25 MCG/ACT AEPB Inhale 1 puff into the lungs in the morning.   folic acid  (FOLVITE ) 1 MG tablet Take 1 tablet (1 mg total) by mouth daily.   Ipratropium-Albuterol  (COMBIVENT ) 20-100 MCG/ACT AERS respimat Inhale 1 puff into the lungs every 6 (six) hours as needed for wheezing or shortness of breath.   losartan  (COZAAR ) 25 MG tablet Take 12.5 mg by mouth daily.   metoprolol  succinate (TOPROL  XL) 50 MG 24 hr tablet Take 1 tablet (50 mg total) by mouth daily. Take with or immediately following a meal. (Patient taking differently: Take 25 mg by mouth daily. Take with or immediately following a meal.)   mirtazapine  (REMERON ) 7.5 MG tablet Take 7.5 mg by mouth at bedtime.    montelukast  (SINGULAIR ) 10 MG tablet Take 10 mg by mouth at bedtime.   pantoprazole  (PROTONIX ) 40 MG tablet Take 1 tablet (40 mg total) by mouth daily.  rosuvastatin  (CRESTOR ) 10 MG tablet Take 1 tablet (10 mg total) by mouth daily.   umeclidinium-vilanterol (ANORO ELLIPTA ) 62.5-25 MCG/ACT AEPB Inhale 1 puff into the lungs daily.   vitamin B-12 (CYANOCOBALAMIN ) 500 MCG tablet Take 1 tablet (500 mcg total) by mouth daily.   [DISCONTINUED] cefdinir  (OMNICEF ) 300 MG capsule Take 1 capsule (300 mg total) by mouth 2 (two) times daily.   [DISCONTINUED] doxycycline  (VIBRA -TABS) 100 MG tablet Take 1 tablet (100 mg total) by mouth every 12 (twelve) hours.   [DISCONTINUED] STIOLTO RESPIMAT  2.5-2.5 MCG/ACT AERS Inhale 2 sprays into the lungs daily.        Past Medical History:  Diagnosis Date   Anxiety    Blind    right   Colitis 09/07/2011   CVA (cerebral infarction)    GERD (gastroesophageal reflux disease)    Hyperlipidemia    Hypertension    Myocardial infarct (HCC)    1997, 2004   Severe major depression with psychotic features (HCC)    2004   Stroke [facility]) 2002       Objective:    Wts  07/24/2024       115  01/25/2024        116  09/01/2023     138   02/22/2023         134    11/16/22 112 lb 12.8 oz (51.2 kg)  10/01/22 108 lb 9.6 oz (49.3 kg)  06/15/22 111 lb 4.3 oz (50.5 kg)    Vital signs reviewed  07/24/2024  - Note at rest 02 sats  92% on 3lpm    General appearance:    w/c bound frail   very weak insp effort    HEENT : Oropharynx  clear/ no top teeth        NECK :  without  apparent JVD/ palpable Nodes/TM    LUNGS: no acc muscle use,  Mild barrel  contour chest wall with bilateral  Distant bs s audible wheeze and  without cough on insp or exp maneuvers  and mild  Hyperresonant  to  percussion bilaterally     CV:  RRR  no s3 or murmur or increase in P2, and no edema   ABD:  soft and nontender   MS:  ext warm without deformities Or obvious joint restrictions  calf  tenderness, cyanosis or clubbing     SKIN: warm and dry without lesions    NEURO:  alert, approp, nl sensorium with  no motor or cerebellar deficits apparent.         CXR PA and Lateral:   07/24/2024 :    I personally reviewed images and impression is as follows:        Assessment   Assessment & Plan Benign neoplasm of mediastinal lymph node  COPD mixed type (HCC) 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  - 10/01/2022  After extensive coaching inhaler device,  effectiveness =    60% smi (short ti)  > try stiolto  - 09/01/2023  After extensive coaching inhaler device,  effectiveness =    60% with SMI (short Ti) > continue stiolto and approp saba  - 01/25/2024  After extensive coaching inhaler device,  effectiveness =    75% DPI and prefers  ANORO >  07/24/2024 changed back to stiolto but techniqu very poor with lots of leakage as tends to breath out when triggering > changed  back to anoro   07/24/2024  After extensive coaching inhaler device,  effectiveness =    80% with DPI/ elipta when given trelegy 200 sample and coaching  plus still needs approp saba:  Re SABA :  I spent extra time with pt today reviewing appropriate use of albuterol  for prn use on exertion with the following points: 1) saba is for relief of sob that does not improve by walking a slower pace or resting but rather if the pt does not improve after trying this first. 2) If the pt is convinced, as many are, that saba helps recover from activity faster then it's easy to tell if this is the case by re-challenging : ie stop, take the inhaler, then p 5 minutes try the exact same activity (intensity of workload) that just caused the symptoms and see if they are substantially diminished or not after saba 3) if there is an activity that reproducibly causes the symptoms, try the saba 15 min before the activity on alternate days   If in fact the saba  really does help, then fine to continue to use it prn but advised may need to look closer at the maintenance regimen  (Anoro for now) being used to achieve better control of airways disease with exertion.   Chronic hypoxic respiratory failure (HCC) D/c on 2lpm with HC03  = 31  06/16/22  - 10/01/2022 Patient Saturations on Room Air at Rest = 88%  while Ambulating = 86% / on 3 Liters of oxygen  while Ambulating = 94% - 02/22/2023   Walked on 2.5 cont lpm  x  2  lap(s) =  approx 300  ft  @ slow pace, stopped due to cp/sob  with lowest 02 sats 95%  - 09/01/2023   Walked on 2lpm cont  x  1  lap(s) =  approx 150  ft  @ mod then slowed pace, stopped due to hip pain  with lowest 02 sats 99%  Well compensated on 3lpm cont at rest but advised: Make sure you check your oxygen  saturation  AT  your highest level of activity (not after you stop)   to be sure it stays over 90% and adjust  02 flow upward to maintain this level if needed but remember to turn it back to previous settings when you stop (to conserve your supply).          Each maintenance medication was reviewed in detail including emphasizing most importantly the difference between maintenance and prns and under what circumstances the prns are to be triggered using an action plan format where appropriate.  Total time for H and P, chart review, counseling, reviewing dpi/ smi/ neb/ 02 /and pulse ox  device(s) and generating customized AVS unique to this [name] visit / same day charting = 32 min post hosp f/u re -eval        F/u 6 m, sooner prn   AVS  Patient Instructions  Plan A = Automatic = Always=    Trelegy 200 (sample) or ANORO  =  one click each am   Plan B = Backup (to supplement plan A, not to replace it) Use your combivent   inhaler as a rescue medication to be used if you can't catch your breath by resting or slowing your pace  or doing a relaxed purse lip breathing pattern.  - The less you use it, the better it will work when you need  it. - Ok to use the inhaler up to 1  puffs  every 4 hours if you must but call for appointment if use goes up over your usual need - Don't leave home without it !!  (think of it like the spare tire or starter fluid for your car)   Plan C = Crisis (instead of Plan B but only if Plan B stops working) - only use your albuterol  nebulizer if you first try Plan B and it fails to help > ok to use the nebulizer up to every 4 hours but if start needing it regularly call for immediate appointment   Make sure you check your oxygen  saturation  AT  your highest level of activity (not after you stop)   to be sure it stays over 90% and adjust  02 flow upward to maintain this level if needed but remember to turn it back to previous settings when you stop (to conserve your supply).  Please schedule a follow up visit in 6  months but call sooner if needed       Ozell America, MD 07/24/2024

## 2024-07-24 NOTE — Patient Instructions (Addendum)
 Plan A = Automatic = Always=    Trelegy 200 (sample) or ANORO  =  one click each am   Plan B = Backup (to supplement plan A, not to replace it) Use your combivent   inhaler as a rescue medication to be used if you can't catch your breath by resting or slowing your pace  or doing a relaxed purse lip breathing pattern.  - The less you use it, the better it will work when you need it. - Ok to use the inhaler up to 1 puffs  every 4 hours if you must but call for appointment if use goes up over your usual need - Don't leave home without it !!  (think of it like the spare tire or starter fluid for your car)   Plan C = Crisis (instead of Plan B but only if Plan B stops working) - only use your albuterol  nebulizer if you first try Plan B and it fails to help > ok to use the nebulizer up to every 4 hours but if start needing it regularly call for immediate appointment   Make sure you check your oxygen  saturation  AT  your highest level of activity (not after you stop)   to be sure it stays over 90% and adjust  02 flow upward to maintain this level if needed but remember to turn it back to previous settings when you stop (to conserve your supply).  Please schedule a follow up visit in 6  months but call sooner if needed   Add: needs cxr as her last study was during admit for pna and had not cleared/ advised/ordered to return at her convenience

## 2024-07-24 NOTE — Assessment & Plan Note (Addendum)
 D/c on 2lpm with HC03  = 31  06/16/22  - 10/01/2022 Patient Saturations on Room Air at Rest = 88%  while Ambulating = 86% / on 3 Liters of oxygen  while Ambulating = 94% - 02/22/2023   Walked on 2.5 cont lpm  x  2  lap(s) =  approx 300  ft  @ slow pace, stopped due to cp/sob  with lowest 02 sats 95%  - 09/01/2023   Walked on 2lpm cont  x  1  lap(s) =  approx 150  ft  @ mod then slowed pace, stopped due to hip pain  with lowest 02 sats 99%  Well compensated on 3lpm cont at rest but advised: Make sure you check your oxygen  saturation  AT  your highest level of activity (not after you stop)   to be sure it stays over 90% and adjust  02 flow upward to maintain this level if needed but remember to turn it back to previous settings when you stop (to conserve your supply).          Each maintenance medication was reviewed in detail including emphasizing most importantly the difference between maintenance and prns and under what circumstances the prns are to be triggered using an action plan format where appropriate.  Total time for H and P, chart review, counseling, reviewing dpi/ smi/ neb/ 02 /and pulse ox  device(s) and generating customized AVS unique to this office visit / same day charting = 32 min post hosp f/u re -eval

## 2024-07-24 NOTE — Assessment & Plan Note (Addendum)
 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  - 10/01/2022  After extensive coaching inhaler device,  effectiveness =    60% smi (short ti)  > try stiolto  - 09/01/2023  After extensive coaching inhaler device,  effectiveness =    60% with SMI (short Ti) > continue stiolto and approp saba  - 01/25/2024  After extensive coaching inhaler device,  effectiveness =    75% DPI and prefers  ANORO >  07/24/2024 changed back to stiolto but techniqu very poor with lots of leakage as tends to breath out when triggering > changed back to anoro   07/24/2024  After extensive coaching inhaler device,  effectiveness =    80% with DPI/ elipta when given trelegy 200 sample and coaching  plus still needs approp saba:  Re SABA :  I spent extra time with pt today reviewing appropriate use of albuterol  for prn use on exertion with the following points: 1) saba is for relief of sob that does not improve by walking a slower pace or resting but rather if the pt does not improve after trying this first. 2) If the pt is convinced, as many are, that saba helps recover from activity faster then it's easy to tell if this is the case by re-challenging : ie stop, take the inhaler, then p 5 minutes try the exact same activity (intensity of workload) that just caused the symptoms and see if they are substantially diminished or not after saba 3) if there is an activity that reproducibly causes the symptoms, try the saba 15 min before the activity on alternate days   If in fact the saba really does help, then fine to continue to use it prn but advised may need to look closer at the maintenance regimen  (Anoro for now) being used to achieve better control of airways disease with exertion.

## 2024-07-25 ENCOUNTER — Encounter (HOSPITAL_COMMUNITY)

## 2024-07-25 ENCOUNTER — Encounter (HOSPITAL_COMMUNITY): Admission: RE | Admit: 2024-07-25 | Source: Ambulatory Visit

## 2024-07-27 ENCOUNTER — Encounter (INDEPENDENT_AMBULATORY_CARE_PROVIDER_SITE_OTHER): Payer: Self-pay | Admitting: Gastroenterology

## 2024-07-28 ENCOUNTER — Ambulatory Visit: Admitting: Internal Medicine

## 2024-08-14 ENCOUNTER — Encounter (INDEPENDENT_AMBULATORY_CARE_PROVIDER_SITE_OTHER): Payer: Self-pay | Admitting: Gastroenterology

## 2024-08-21 ENCOUNTER — Telehealth: Payer: Self-pay

## 2024-08-21 ENCOUNTER — Other Ambulatory Visit: Payer: Self-pay

## 2024-08-21 MED ORDER — MONTELUKAST SODIUM 10 MG PO TABS: 10.0000 mg | ORAL_TABLET | Freq: Every day | ORAL | 0 refills | Status: AC

## 2024-08-21 NOTE — Telephone Encounter (Unsigned)
 Copied from CRM [id number]. Topic: Clinical - Medication Refill >> Aug 21, 2024  9:49 AM Myrick T wrote: Medication: montelukast  (SINGULAIR ) 10 MG tablet  Has the patient contacted their pharmacy? Yes  This is the patient's preferred pharmacy:  [facility] [phone] - [place],  - [address] AT [facility] [address] [place] [address] Phone: [phone] Fax: [phone]  Is this the correct pharmacy for this prescription? Yes  Has the prescription been filled recently? Yes  Is the patient out of the medication? No  Has the patient been seen for an appointment in the last year OR does the patient have an upcoming appointment? Yes  Can we respond through MyChart? No  Agent: Please be advised that Rx refills may take up to 3 business days. We ask that you follow-up with your pharmacy.

## 2024-08-21 NOTE — Telephone Encounter (Signed)
 Sent to pharmacy

## 2024-09-03 ENCOUNTER — Other Ambulatory Visit: Payer: Self-pay | Admitting: Internal Medicine

## 2024-09-04 ENCOUNTER — Ambulatory Visit: Payer: Self-pay

## 2024-09-04 ENCOUNTER — Other Ambulatory Visit: Payer: Self-pay | Admitting: Family Medicine

## 2024-09-04 NOTE — Telephone Encounter (Signed)
 Copied from CRM #[id number]. Topic: Clinical - Medication Question >> Sep 04, 2024  9:56 AM Tonda B wrote: Reason for CRM: patient son mark jones is calling in has question about breathing machine for pt please call [phone]

## 2024-09-04 NOTE — Telephone Encounter (Unsigned)
 Copied from CRM #[id number]. Topic: Clinical - Medication Refill >> Sep 04, 2024  9:42 AM Donna BRAVO wrote: Medication:  pantoprazole  (PROTONIX ) 40 MG tablet  metoprolol  succinate (TOPROL  XL) 50 MG 24 hr tablet  Has the patient contacted their pharmacy? Yes Pharmacy stated to call provider  This is the patient's preferred pharmacy:   [facility] [phone] - [place], [place] - [address] AT [facility] OF [address] [place] [address] Phone: [phone] Fax: [phone]  Is this the correct pharmacy for this prescription? Yes If no, delete pharmacy and type the correct one.   Has the prescription been filled recently? Yes  Is the patient out of the medication? Yes  Has the patient been seen for an appointment in the last year OR does the patient have an upcoming appointment? Yes  Can we respond through MyChart? No  Agent: Please be advised that Rx refills may take up to 3 business days. We ask that you follow-up with your pharmacy.  Call dropped before I could verify MyChart question

## 2024-09-05 ENCOUNTER — Other Ambulatory Visit: Payer: Self-pay

## 2024-09-05 DIAGNOSIS — J449 Chronic obstructive pulmonary disease, unspecified: Secondary | ICD-10-CM

## 2024-09-05 DIAGNOSIS — D360 Benign neoplasm of lymph nodes: Secondary | ICD-10-CM

## 2024-09-05 MED ORDER — PANTOPRAZOLE SODIUM 40 MG PO TBEC: 40.0000 mg | DELAYED_RELEASE_TABLET | Freq: Every day | ORAL | 2 refills | Status: AC

## 2024-09-05 NOTE — Telephone Encounter (Signed)
Order placed. Nfn

## 2024-09-05 NOTE — Telephone Encounter (Signed)
 Called- spoke to patient son Oneil molt per Dpr. He wants nebulizer order sent to [facility] instead of [facility].

## 2024-09-18 ENCOUNTER — Telehealth: Payer: Self-pay

## 2024-09-18 NOTE — Telephone Encounter (Signed)
 LVM for Oneil to inform him I am resending the order to [facility] for the [employer].  Requested return call with questions or concerns.

## 2024-09-18 NOTE — Telephone Encounter (Signed)
 Copied from CRM [id number]. Topic: Clinical - Order For Equipment >> Sep 18, 2024 10:22 AM Essie A wrote: Reason for CRM: Patient's son, Oneil, called regarding nebulizer machine.  He spoke with [facility] today and they informed him that they have not received the order.  The DME order was made on on 09/05/24.  Please return his call to let him know the status of this order. His phone number is [phone].  Thanks.  Please advise

## 2024-11-07 ENCOUNTER — Ambulatory Visit: Admitting: Family Medicine
